# Patient Record
Sex: Male | Born: 1960 | Race: White | Hispanic: No | Marital: Married | Smoking: Never smoker
Health system: Southern US, Community
[De-identification: ages and names within clinical notes are randomized; demographics above are authoritative.]

## PROBLEM LIST (undated history)

## (undated) DIAGNOSIS — I499 Cardiac arrhythmia, unspecified: Secondary | ICD-10-CM

## (undated) DIAGNOSIS — E785 Hyperlipidemia, unspecified: Secondary | ICD-10-CM

## (undated) DIAGNOSIS — J189 Pneumonia, unspecified organism: Secondary | ICD-10-CM

## (undated) DIAGNOSIS — F329 Major depressive disorder, single episode, unspecified: Secondary | ICD-10-CM

## (undated) DIAGNOSIS — R011 Cardiac murmur, unspecified: Secondary | ICD-10-CM

## (undated) DIAGNOSIS — N529 Male erectile dysfunction, unspecified: Secondary | ICD-10-CM

## (undated) DIAGNOSIS — C44310 Basal cell carcinoma of skin of unspecified parts of face: Secondary | ICD-10-CM

## (undated) DIAGNOSIS — R079 Chest pain, unspecified: Secondary | ICD-10-CM

## (undated) DIAGNOSIS — I639 Cerebral infarction, unspecified: Secondary | ICD-10-CM

## (undated) DIAGNOSIS — F32A Depression, unspecified: Secondary | ICD-10-CM

## (undated) DIAGNOSIS — G709 Myoneural disorder, unspecified: Secondary | ICD-10-CM

## (undated) DIAGNOSIS — I219 Acute myocardial infarction, unspecified: Secondary | ICD-10-CM

## (undated) DIAGNOSIS — Z87442 Personal history of urinary calculi: Secondary | ICD-10-CM

## (undated) DIAGNOSIS — M1990 Unspecified osteoarthritis, unspecified site: Secondary | ICD-10-CM

## (undated) DIAGNOSIS — I2510 Atherosclerotic heart disease of native coronary artery without angina pectoris: Secondary | ICD-10-CM

## (undated) DIAGNOSIS — Z9861 Coronary angioplasty status: Secondary | ICD-10-CM

## (undated) DIAGNOSIS — D869 Sarcoidosis, unspecified: Secondary | ICD-10-CM

## (undated) DIAGNOSIS — G4730 Sleep apnea, unspecified: Secondary | ICD-10-CM

## (undated) DIAGNOSIS — Z8489 Family history of other specified conditions: Secondary | ICD-10-CM

## (undated) DIAGNOSIS — I10 Essential (primary) hypertension: Secondary | ICD-10-CM

## (undated) DIAGNOSIS — N189 Chronic kidney disease, unspecified: Secondary | ICD-10-CM

## (undated) DIAGNOSIS — R0600 Dyspnea, unspecified: Secondary | ICD-10-CM

## (undated) DIAGNOSIS — I209 Angina pectoris, unspecified: Secondary | ICD-10-CM

## (undated) DIAGNOSIS — K219 Gastro-esophageal reflux disease without esophagitis: Secondary | ICD-10-CM

## (undated) DIAGNOSIS — K5790 Diverticulosis of intestine, part unspecified, without perforation or abscess without bleeding: Secondary | ICD-10-CM

## (undated) HISTORY — PX: OTHER SURGICAL HISTORY: SHX169

## (undated) HISTORY — DX: Major depressive disorder, single episode, unspecified: F32.9

## (undated) HISTORY — DX: Unspecified osteoarthritis, unspecified site: M19.90

## (undated) HISTORY — PX: COLONOSCOPY: SHX174

## (undated) HISTORY — DX: Depression, unspecified: F32.A

## (undated) HISTORY — DX: Chest pain, unspecified: R07.9

## (undated) HISTORY — PX: LUNG BIOPSY: SHX232

## (undated) HISTORY — PX: CARPAL TUNNEL RELEASE: SHX101

## (undated) HISTORY — PX: HAND SURGERY: SHX662

## (undated) HISTORY — DX: Personal history of urinary calculi: Z87.442

## (undated) HISTORY — PX: ELBOW SURGERY: SHX618

## (undated) HISTORY — DX: Basal cell carcinoma of skin of unspecified parts of face: C44.310

## (undated) HISTORY — DX: Essential (primary) hypertension: I10

## (undated) HISTORY — DX: Male erectile dysfunction, unspecified: N52.9

## (undated) HISTORY — DX: Chronic kidney disease, unspecified: N18.9

## (undated) HISTORY — DX: Diverticulosis of intestine, part unspecified, without perforation or abscess without bleeding: K57.90

## (undated) HISTORY — DX: Sleep apnea, unspecified: G47.30

## (undated) HISTORY — DX: Atherosclerotic heart disease of native coronary artery without angina pectoris: I25.10

## (undated) HISTORY — PX: ROTATOR CUFF REPAIR: SHX139

## (undated) HISTORY — DX: Coronary angioplasty status: Z98.61

## (undated) HISTORY — DX: Hyperlipidemia, unspecified: E78.5

## (undated) HISTORY — PX: KNEE SURGERY: SHX244

## (undated) HISTORY — PX: CORONARY ANGIOPLASTY WITH STENT PLACEMENT: SHX49

---

## 1998-04-24 ENCOUNTER — Encounter: Payer: Self-pay | Admitting: Emergency Medicine

## 1998-04-24 ENCOUNTER — Emergency Department (HOSPITAL_COMMUNITY): Admission: EM | Admit: 1998-04-24 | Discharge: 1998-04-24 | Payer: Self-pay | Admitting: Emergency Medicine

## 1998-10-28 ENCOUNTER — Ambulatory Visit (HOSPITAL_COMMUNITY): Admission: RE | Admit: 1998-10-28 | Discharge: 1998-10-28 | Payer: Self-pay | Admitting: Family Medicine

## 1998-10-28 ENCOUNTER — Encounter: Payer: Self-pay | Admitting: Family Medicine

## 1999-08-26 ENCOUNTER — Encounter: Payer: Self-pay | Admitting: Family Medicine

## 1999-08-26 ENCOUNTER — Ambulatory Visit (HOSPITAL_COMMUNITY): Admission: RE | Admit: 1999-08-26 | Discharge: 1999-08-26 | Payer: Self-pay | Admitting: Family Medicine

## 2000-01-30 ENCOUNTER — Encounter: Payer: Self-pay | Admitting: Orthopedic Surgery

## 2000-01-30 ENCOUNTER — Encounter: Admission: RE | Admit: 2000-01-30 | Discharge: 2000-01-30 | Payer: Self-pay | Admitting: Orthopedic Surgery

## 2001-11-24 ENCOUNTER — Encounter: Admission: RE | Admit: 2001-11-24 | Discharge: 2001-11-24 | Payer: Self-pay | Admitting: Emergency Medicine

## 2001-11-24 ENCOUNTER — Encounter: Payer: Self-pay | Admitting: Emergency Medicine

## 2003-06-16 ENCOUNTER — Emergency Department (HOSPITAL_COMMUNITY): Admission: EM | Admit: 2003-06-16 | Discharge: 2003-06-16 | Payer: Self-pay | Admitting: Emergency Medicine

## 2003-06-16 ENCOUNTER — Encounter: Payer: Self-pay | Admitting: Emergency Medicine

## 2003-07-30 ENCOUNTER — Ambulatory Visit (HOSPITAL_COMMUNITY): Admission: RE | Admit: 2003-07-30 | Discharge: 2003-07-31 | Payer: Self-pay | Admitting: Cardiology

## 2003-08-08 ENCOUNTER — Inpatient Hospital Stay (HOSPITAL_COMMUNITY): Admission: EM | Admit: 2003-08-08 | Discharge: 2003-08-09 | Payer: Self-pay | Admitting: Emergency Medicine

## 2003-09-06 ENCOUNTER — Encounter: Admission: RE | Admit: 2003-09-06 | Discharge: 2003-09-06 | Payer: Self-pay | Admitting: Emergency Medicine

## 2003-09-06 ENCOUNTER — Encounter (INDEPENDENT_AMBULATORY_CARE_PROVIDER_SITE_OTHER): Payer: Self-pay | Admitting: Specialist

## 2003-09-06 ENCOUNTER — Inpatient Hospital Stay (HOSPITAL_COMMUNITY): Admission: EM | Admit: 2003-09-06 | Discharge: 2003-09-09 | Payer: Self-pay | Admitting: Emergency Medicine

## 2003-09-08 HISTORY — PX: APPENDECTOMY: SHX54

## 2003-09-14 ENCOUNTER — Encounter: Admission: RE | Admit: 2003-09-14 | Discharge: 2003-09-14 | Payer: Self-pay | Admitting: Surgery

## 2004-08-08 ENCOUNTER — Emergency Department (HOSPITAL_COMMUNITY): Admission: EM | Admit: 2004-08-08 | Discharge: 2004-08-09 | Payer: Self-pay | Admitting: Emergency Medicine

## 2005-10-16 ENCOUNTER — Emergency Department (HOSPITAL_COMMUNITY): Admission: EM | Admit: 2005-10-16 | Discharge: 2005-10-16 | Payer: Self-pay | Admitting: Emergency Medicine

## 2005-10-23 ENCOUNTER — Encounter: Admission: RE | Admit: 2005-10-23 | Discharge: 2005-10-23 | Payer: Self-pay | Admitting: Emergency Medicine

## 2005-11-18 ENCOUNTER — Inpatient Hospital Stay (HOSPITAL_COMMUNITY): Admission: EM | Admit: 2005-11-18 | Discharge: 2005-11-20 | Payer: Self-pay | Admitting: Emergency Medicine

## 2005-12-17 ENCOUNTER — Encounter: Admission: RE | Admit: 2005-12-17 | Discharge: 2005-12-17 | Payer: Self-pay | Admitting: Emergency Medicine

## 2006-08-26 ENCOUNTER — Ambulatory Visit (HOSPITAL_BASED_OUTPATIENT_CLINIC_OR_DEPARTMENT_OTHER): Admission: RE | Admit: 2006-08-26 | Discharge: 2006-08-26 | Payer: Self-pay | Admitting: Orthopedic Surgery

## 2006-09-16 ENCOUNTER — Ambulatory Visit (HOSPITAL_COMMUNITY): Admission: RE | Admit: 2006-09-16 | Discharge: 2006-09-16 | Payer: Self-pay | Admitting: Orthopedic Surgery

## 2006-10-28 ENCOUNTER — Ambulatory Visit (HOSPITAL_COMMUNITY): Admission: RE | Admit: 2006-10-28 | Discharge: 2006-10-29 | Payer: Self-pay | Admitting: Orthopaedic Surgery

## 2006-12-09 ENCOUNTER — Ambulatory Visit (HOSPITAL_BASED_OUTPATIENT_CLINIC_OR_DEPARTMENT_OTHER): Admission: RE | Admit: 2006-12-09 | Discharge: 2006-12-09 | Payer: Self-pay | Admitting: Orthopedic Surgery

## 2007-02-16 ENCOUNTER — Inpatient Hospital Stay (HOSPITAL_COMMUNITY): Admission: EM | Admit: 2007-02-16 | Discharge: 2007-02-18 | Payer: Self-pay | Admitting: Emergency Medicine

## 2007-02-20 ENCOUNTER — Ambulatory Visit: Payer: Self-pay | Admitting: *Deleted

## 2007-02-21 ENCOUNTER — Inpatient Hospital Stay (HOSPITAL_COMMUNITY): Admission: EM | Admit: 2007-02-21 | Discharge: 2007-02-22 | Payer: Self-pay | Admitting: Emergency Medicine

## 2007-07-08 ENCOUNTER — Ambulatory Visit (HOSPITAL_COMMUNITY): Admission: RE | Admit: 2007-07-08 | Discharge: 2007-07-08 | Payer: Self-pay | Admitting: Orthopaedic Surgery

## 2007-09-08 HISTORY — PX: CERVICAL FUSION: SHX112

## 2007-09-14 ENCOUNTER — Inpatient Hospital Stay (HOSPITAL_COMMUNITY): Admission: EM | Admit: 2007-09-14 | Discharge: 2007-09-15 | Payer: Self-pay | Admitting: Emergency Medicine

## 2007-12-29 ENCOUNTER — Inpatient Hospital Stay (HOSPITAL_COMMUNITY): Admission: RE | Admit: 2007-12-29 | Discharge: 2007-12-31 | Payer: Self-pay | Admitting: Specialist

## 2008-06-07 IMAGING — CR DG CHEST 1V PORT
1 series · 1 of 1 positions shown · non-contrast
Comparison: 09/16/06.

CLINICAL DATA: Chest pain.  Shortness of breath. 
 PORTABLE CHEST ? 1 VIEW ? 02/16/07 AT 6527 HOURS:

[view not recorded]
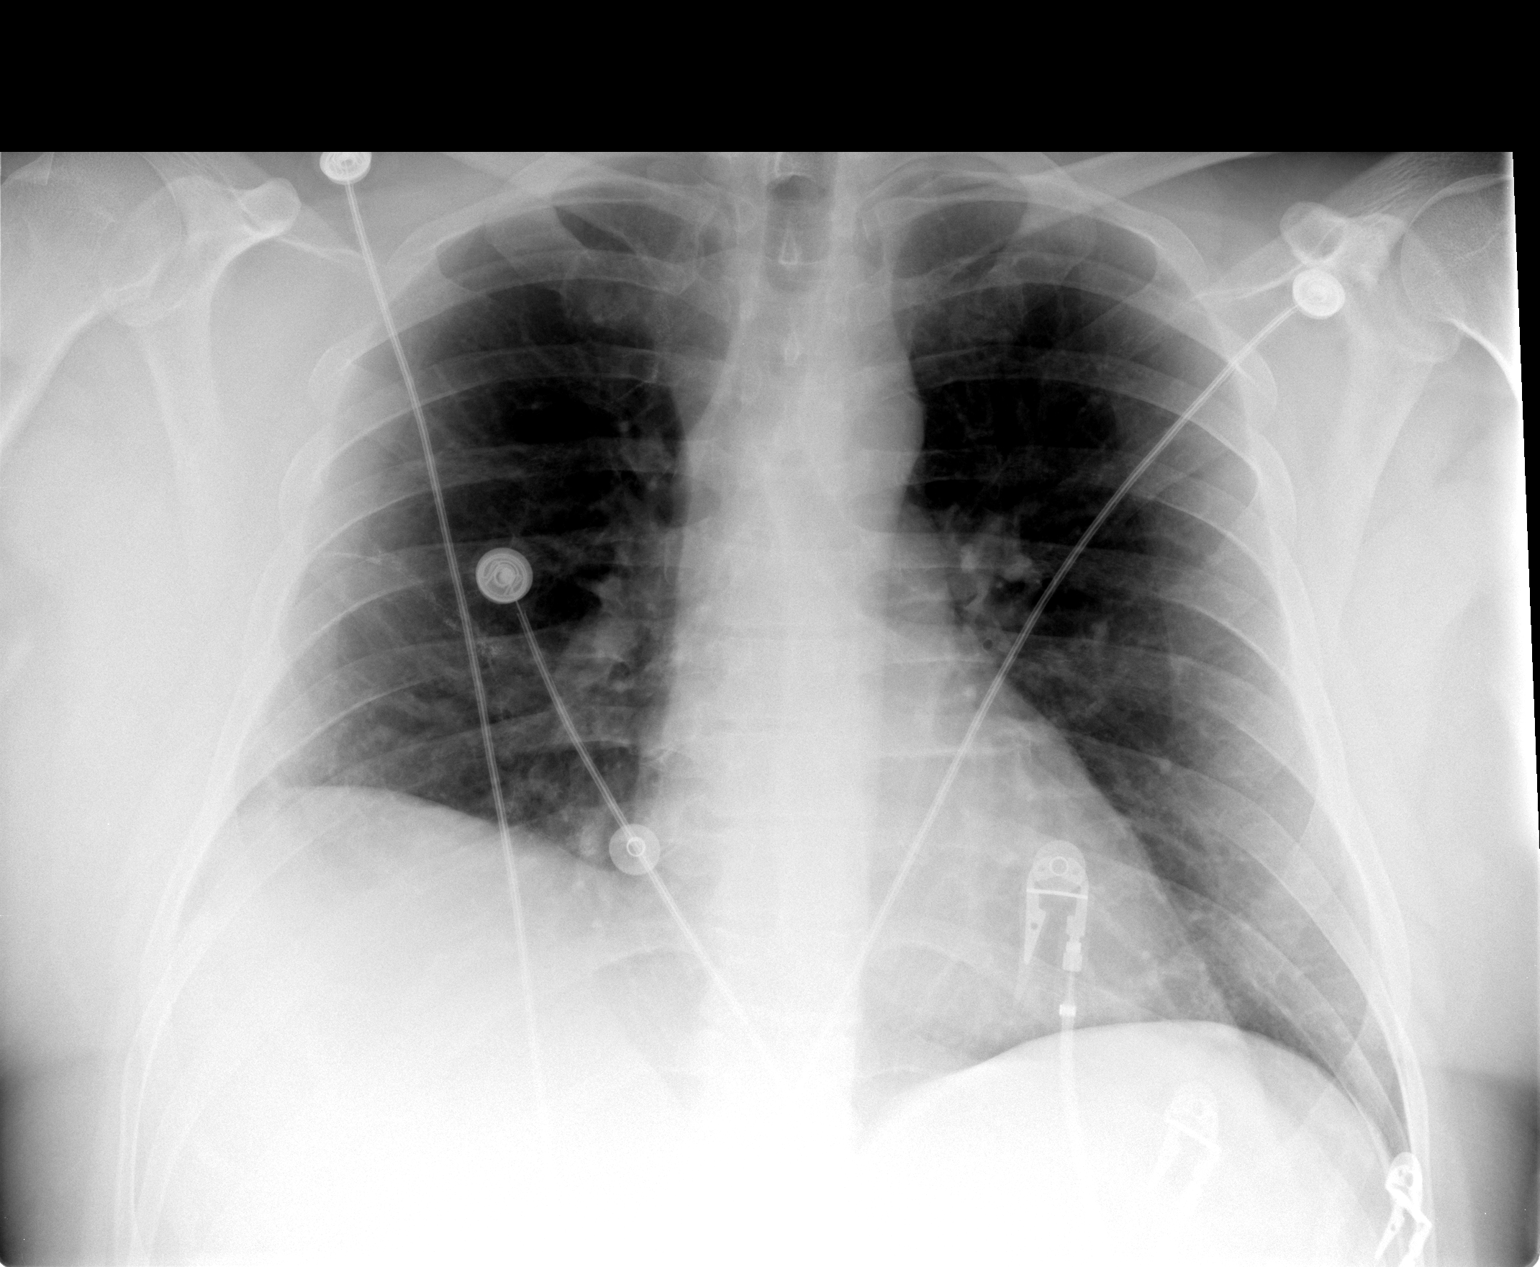

[1 of 1 positions shown; findings below may reference images not displayed]

FINDINGS: Heart size is within normal limits.  The right hemidiaphragm is mildly elevated, as seen on prior studies.  There is no acute infiltrate, effusion, or edema.  There are some surgical clips in the right lung base from prior resection.
IMPRESSION: Postop changes of the right lower lobe.  No acute abnormality.

## 2008-06-11 IMAGING — CR DG CHEST 1V PORT
1 series · 1 of 1 positions shown · non-contrast
Comparison: 02/16/07.

CLINICAL DATA: Chest pain.  
 PORTABLE CHEST - 1 VIEW 02/20/07 AT 8254 HOURS:

[view not recorded]
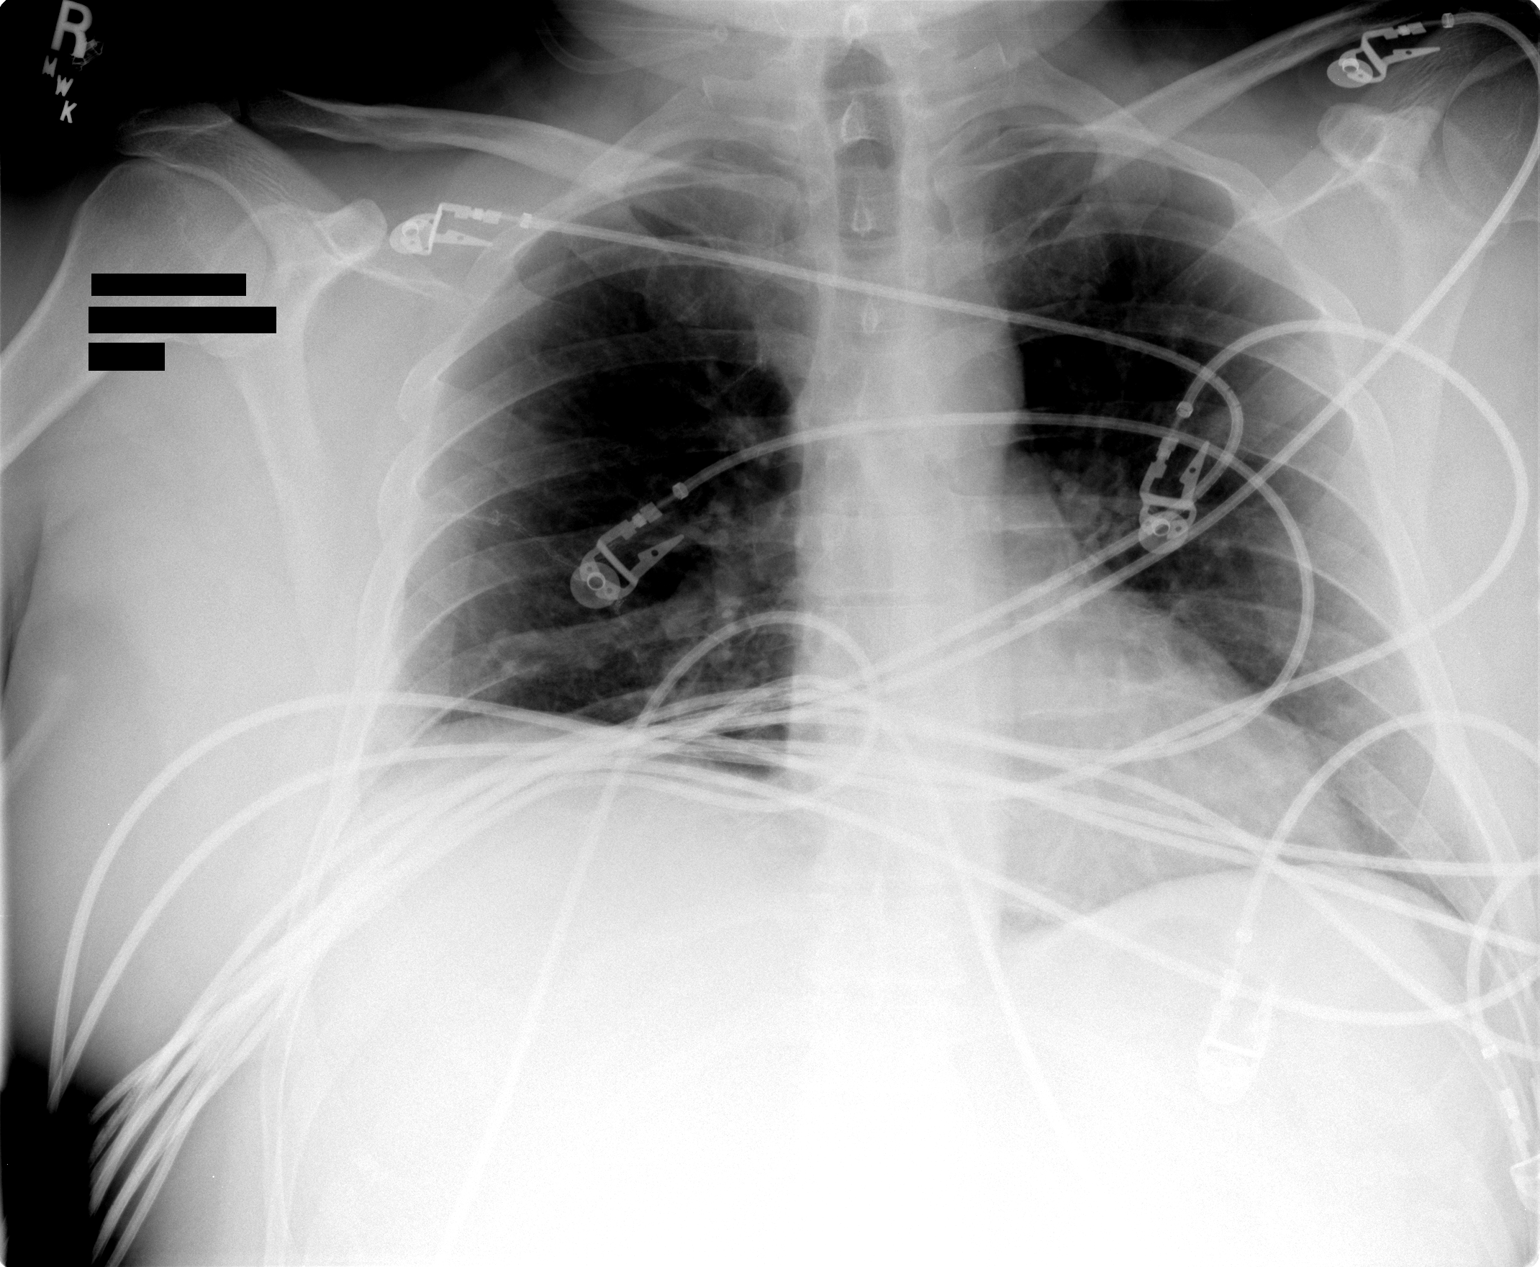

[1 of 1 positions shown; findings below may reference images not displayed]

FINDINGS: The lungs are under inflated with bibasilar atelectasis.  The heart is normal in size.  No pneumothoraces or effusions are seen.  Postoperative changes in the right mid lung are noted.
IMPRESSION: No acute cardiopulmonary disease.

## 2008-10-03 ENCOUNTER — Ambulatory Visit: Payer: Self-pay | Admitting: Cardiovascular Disease

## 2008-10-16 ENCOUNTER — Encounter: Admission: RE | Admit: 2008-10-16 | Discharge: 2008-10-16 | Payer: Self-pay | Admitting: Specialist

## 2010-03-04 ENCOUNTER — Encounter: Payer: Self-pay | Admitting: Family Medicine

## 2010-06-03 ENCOUNTER — Encounter: Payer: Self-pay | Admitting: Family Medicine

## 2010-06-05 ENCOUNTER — Encounter: Payer: Self-pay | Admitting: Family Medicine

## 2010-09-22 ENCOUNTER — Encounter: Payer: Self-pay | Admitting: Family Medicine

## 2010-09-28 ENCOUNTER — Encounter: Payer: Self-pay | Admitting: Cardiology

## 2010-09-28 ENCOUNTER — Encounter: Payer: Self-pay | Admitting: Family Medicine

## 2010-09-29 ENCOUNTER — Inpatient Hospital Stay (HOSPITAL_COMMUNITY)
Admission: EM | Admit: 2010-09-29 | Discharge: 2010-09-29 | Payer: Self-pay | Source: Home / Self Care | Attending: Cardiology | Admitting: Cardiology

## 2010-09-30 DIAGNOSIS — I2510 Atherosclerotic heart disease of native coronary artery without angina pectoris: Secondary | ICD-10-CM | POA: Insufficient documentation

## 2010-09-30 DIAGNOSIS — R079 Chest pain, unspecified: Secondary | ICD-10-CM | POA: Insufficient documentation

## 2010-09-30 DIAGNOSIS — G4730 Sleep apnea, unspecified: Secondary | ICD-10-CM | POA: Insufficient documentation

## 2010-09-30 DIAGNOSIS — I10 Essential (primary) hypertension: Secondary | ICD-10-CM | POA: Insufficient documentation

## 2010-09-30 DIAGNOSIS — Z9861 Coronary angioplasty status: Secondary | ICD-10-CM | POA: Insufficient documentation

## 2010-09-30 LAB — COMPREHENSIVE METABOLIC PANEL
ALT: 40 U/L (ref 0–53)
AST: 30 U/L (ref 0–37)
Albumin: 4.3 g/dL (ref 3.5–5.2)
Alkaline Phosphatase: 74 U/L (ref 39–117)
BUN: 12 mg/dL (ref 6–23)
CO2: 22 mEq/L (ref 19–32)
Calcium: 10.1 mg/dL (ref 8.4–10.5)
Chloride: 104 mEq/L (ref 96–112)
Creatinine, Ser: 0.91 mg/dL (ref 0.4–1.5)
GFR calc Af Amer: >60 mL/min (ref >60)
GFR calc non Af Amer: >60 mL/min (ref >60)
Glucose, Bld: 201 mg/dL — ABNORMAL HIGH (ref 70–99)
Potassium: 4.3 mEq/L (ref 3.5–5.1)
Sodium: 139 mEq/L (ref 135–145)
Total Bilirubin: 0.7 mg/dL (ref 0.3–1.2)
Total Protein: 8.1 g/dL (ref 6.0–8.3)

## 2010-09-30 LAB — CBC
HCT: 47.3 % (ref 39.0–52.0)
Hemoglobin: 16.1 g/dL (ref 13.0–17.0)
MCH: 29.9 pg (ref 26.0–34.0)
MCHC: 34 g/dL (ref 30.0–36.0)
MCV: 87.8 fL (ref 78.0–100.0)
Platelets: 217 10*3/uL (ref 150–400)
RBC: 5.39 MIL/uL (ref 4.22–5.81)
RDW: 13.3 % (ref 11.5–15.5)
WBC: 8.1 10*3/uL (ref 4.0–10.5)

## 2010-09-30 LAB — POCT I-STAT, CHEM 8
BUN: 15 mg/dL (ref 6–23)
Calcium, Ion: 1.19 mmol/L (ref 1.12–1.32)
Chloride: 106 mEq/L (ref 96–112)
Creatinine, Ser: 1 mg/dL (ref 0.4–1.5)
Glucose, Bld: 207 mg/dL — ABNORMAL HIGH (ref 70–99)
HCT: 52 % (ref 39.0–52.0)
Hemoglobin: 17.7 g/dL — ABNORMAL HIGH (ref 13.0–17.0)
Potassium: 4.3 mEq/L (ref 3.5–5.1)
Sodium: 139 mEq/L (ref 135–145)
TCO2: 25 mmol/L (ref 0–100)

## 2010-09-30 LAB — CK TOTAL AND CKMB (NOT AT ARMC)
CK, MB: 1.3 ng/mL (ref 0.3–4.0)
Relative Index: INVALID (ref 0.0–2.5)
Total CK: 72 U/L (ref 7–232)

## 2010-09-30 LAB — POCT CARDIAC MARKERS
CKMB, poc: <1 ng/mL — ABNORMAL LOW (ref 1.0–8.0)
Myoglobin, poc: 68.9 ng/mL (ref 12–200)
Troponin i, poc: <0.05 ng/mL (ref 0.00–0.09)

## 2010-09-30 LAB — HEMOGLOBIN A1C
Hgb A1c MFr Bld: 7.8 % — ABNORMAL HIGH (ref <5.7)
Mean Plasma Glucose: 177 mg/dL — ABNORMAL HIGH (ref <117)

## 2010-09-30 LAB — LIPID PANEL
Cholesterol: 168 mg/dL (ref 0–200)
HDL: 46 mg/dL (ref >39)
LDL Cholesterol: 93 mg/dL (ref 0–99)
Total CHOL/HDL Ratio: 3.7 RATIO
Triglycerides: 143 mg/dL (ref <150)
VLDL: 29 mg/dL (ref 0–40)

## 2010-09-30 LAB — TSH: TSH: 1.867 u[IU]/mL (ref 0.350–4.500)

## 2010-09-30 LAB — TROPONIN I: Troponin I: 0.02 ng/mL (ref 0.00–0.06)

## 2010-10-09 NOTE — Letter (Signed)
Summary: [facility]  [facility]   Imported By: Maryln Gottron 09/30/2010 15:09:36  _____________________________________________________________________  External Attachment:    Type:   Image     Comment:   External Document

## 2010-10-13 ENCOUNTER — Ambulatory Visit: Payer: Self-pay | Admitting: Family Medicine

## 2010-10-20 ENCOUNTER — Telehealth: Payer: Self-pay | Admitting: Family Medicine

## 2010-10-20 ENCOUNTER — Ambulatory Visit (INDEPENDENT_AMBULATORY_CARE_PROVIDER_SITE_OTHER): Payer: PRIVATE HEALTH INSURANCE | Admitting: Family Medicine

## 2010-10-20 ENCOUNTER — Encounter: Payer: Self-pay | Admitting: Family Medicine

## 2010-10-20 DIAGNOSIS — E7800 Pure hypercholesterolemia, unspecified: Secondary | ICD-10-CM | POA: Insufficient documentation

## 2010-10-20 DIAGNOSIS — M129 Arthropathy, unspecified: Secondary | ICD-10-CM | POA: Insufficient documentation

## 2010-10-20 DIAGNOSIS — E1149 Type 2 diabetes mellitus with other diabetic neurological complication: Secondary | ICD-10-CM | POA: Insufficient documentation

## 2010-10-20 DIAGNOSIS — Z87442 Personal history of urinary calculi: Secondary | ICD-10-CM | POA: Insufficient documentation

## 2010-10-20 DIAGNOSIS — F329 Major depressive disorder, single episode, unspecified: Secondary | ICD-10-CM | POA: Insufficient documentation

## 2010-10-20 DIAGNOSIS — M1990 Unspecified osteoarthritis, unspecified site: Secondary | ICD-10-CM | POA: Insufficient documentation

## 2010-10-20 DIAGNOSIS — Z9189 Other specified personal risk factors, not elsewhere classified: Secondary | ICD-10-CM | POA: Insufficient documentation

## 2010-10-20 DIAGNOSIS — R079 Chest pain, unspecified: Secondary | ICD-10-CM

## 2010-10-22 ENCOUNTER — Telehealth: Payer: Self-pay | Admitting: Family Medicine

## 2010-10-22 ENCOUNTER — Other Ambulatory Visit (INDEPENDENT_AMBULATORY_CARE_PROVIDER_SITE_OTHER): Payer: PRIVATE HEALTH INSURANCE

## 2010-10-22 ENCOUNTER — Encounter (INDEPENDENT_AMBULATORY_CARE_PROVIDER_SITE_OTHER): Payer: Self-pay | Admitting: *Deleted

## 2010-10-22 ENCOUNTER — Other Ambulatory Visit: Payer: Self-pay | Admitting: Family Medicine

## 2010-10-22 DIAGNOSIS — E7800 Pure hypercholesterolemia, unspecified: Secondary | ICD-10-CM

## 2010-10-22 DIAGNOSIS — E1149 Type 2 diabetes mellitus with other diabetic neurological complication: Secondary | ICD-10-CM

## 2010-10-22 LAB — BASIC METABOLIC PANEL
BUN: 12 mg/dL (ref 6–23)
CO2: 29 mEq/L (ref 19–32)
Calcium: 9.3 mg/dL (ref 8.4–10.5)
Chloride: 106 mEq/L (ref 96–112)
Creatinine, Ser: 1 mg/dL (ref 0.4–1.5)
GFR: 80.14 mL/min (ref >60.00)
Glucose, Bld: 158 mg/dL — ABNORMAL HIGH (ref 70–99)
Potassium: 4.1 mEq/L (ref 3.5–5.1)
Sodium: 142 mEq/L (ref 135–145)

## 2010-10-22 LAB — HEPATIC FUNCTION PANEL
ALT: 46 U/L (ref 0–53)
AST: 32 U/L (ref 0–37)
Albumin: 4 g/dL (ref 3.5–5.2)
Alkaline Phosphatase: 63 U/L (ref 39–117)
Bilirubin, Direct: 0.1 mg/dL (ref 0.0–0.3)
Total Bilirubin: 0.6 mg/dL (ref 0.3–1.2)
Total Protein: 7.1 g/dL (ref 6.0–8.3)

## 2010-10-22 LAB — LIPID PANEL
Cholesterol: 147 mg/dL (ref 0–200)
HDL: 39 mg/dL — ABNORMAL LOW (ref >39.00)
LDL Cholesterol: 79 mg/dL (ref 0–99)
Total CHOL/HDL Ratio: 4
Triglycerides: 143 mg/dL (ref 0.0–149.0)
VLDL: 28.6 mg/dL (ref 0.0–40.0)

## 2010-10-22 LAB — HEMOGLOBIN A1C: Hgb A1c MFr Bld: 8.2 % — ABNORMAL HIGH (ref 4.6–6.5)

## 2010-10-23 ENCOUNTER — Encounter: Payer: Self-pay | Admitting: Family Medicine

## 2010-10-23 NOTE — Letter (Signed)
Summary: [facility]  External Correspondence   Imported By: Kassie Mends 10/13/2010 11:25:09  _____________________________________________________________________  External Attachment:    Type:   Image     Comment:   External Document

## 2010-10-29 NOTE — Letter (Signed)
Summary: [facility]  [facility]   Imported By: Maryln Gottron 10/24/2010 14:19:05  _____________________________________________________________________  External Attachment:    Type:   Image     Comment:   External Document

## 2010-10-29 NOTE — Progress Notes (Signed)
  Phone Note Call from Patient Call back at [phone]   Caller: Patient Summary of Call: Patients Ortho Dr recommended he take something before having the EMGs like Valium etc, as he told the patient he had this done himself and it really hurts. He forgot to ask you for something before he left. He uses Facilities [facility].  Initial call taken by: Carlton Adam,  October 20, 2010 3:36 PM  Follow-up for Phone Call        please call in valium 5mg , 1/2 to 1 tab by mouth before the procedure.  #1, no rf.  He'll need to have a driver and he'll need to notify the clinic about the potential use of the medicine before going for the procedure.  He needs to make sure it is okay with them.  Sedation caution.    And I called Dr. Yetta Barre and left a message at his clinic.  I will await return call. Crawford Givens MD  October 21, 2010 8:28 AM   Left message on voicemail  to return call.  Medication phoned to pharmacy. Lugene Fuquay CMA (AAMA)  October 21, 2010 10:02 AM   Patient Advised. Lugene Fuquay CMA (AAMA)  October 22, 2010 10:34 AM      New/Updated Medications: VALIUM 5 MG TABS (DIAZEPAM) Take 1/2 to 1 tablet by mouth prior to procedure.

## 2010-10-29 NOTE — Assessment & Plan Note (Signed)
Summary: new pt to est JRT r/s from 10/13/10   Vital Signs:  Patient profile:   50 year old male Height:      71 inches Weight:      216.75 pounds BMI:     30.34 Temp:     98.7 degrees F oral Pulse rate:   80 / minute Pulse rhythm:   regular BP sitting:   130 / 86  (left arm) Cuff size:   large  Vitals Entered By: Delilah Shan CMA Shaya Reddick Dull) (October 20, 2010 2:02 PM) CC: New Patient to Establish   History of Present Illness: Diabetes:  Using medications without difficulties:yes Hypoglycemic episodes: no Hyperglycemic episodes:yes Feet problems:yes Blood Sugars averaging:140-200 in AMs eye exam within last year: no, appointment is pending  Hypertension:      Using medication without problems or lightheadedness: yes Chest pain with exertion:see below Edema:no Short of breath: occ, with exertion Average home BPs:not checked recently  Other issues: see below  Chronic chest pain.  S/p ranexa w/o relief.  Can be sharp but may be dull.  Variable severity.  Constant presence.  At rest, it isn't as painful.  If he exerts himself, he is more likely to hurt more but it don't necessarily correspond with the activity.  Last pain free day was about 3 months ago.  It was present intermittently before that.  Occ twinge of pain in the upper abdomen.  NTG help some with the pain. There is a possible plan for recath at the end of 2/12.  Also with h/o sarcoid.    R leg continues to ache "like a ton of bricks in the leg."  Similar but less pain in L leg.  No help with gabapentin. Prev notes from ortho reviewed.   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 Osteoarthritis [facility]- cards Yetta Barre- [facility])  Past Surgical History: 2008: Cardiac cath 02/2007: taxus stent in RCA and LAD. 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  Otherwise negative.    Physical Exam  General:  no apparent distress normocephalic atraumatic mucous membranes moist neck supple regular rate and rhythm clear to auscultation bilaterally abdomen soft, not tender to palpation, normal bs Dec in sensation on the feet bilaterally with decrease in strength for standing on toes/heels and resisted flexion/ext of great toes, all weaker on L than R leg.  decrease in quad strength, again symptoms L>R.  some of the weakness on testing may be related to pain and the patient admits this 1+ DP pulses bilaterally  Diabetes Management Exam:    Foot Exam (with socks and/or shoes not present):       Sensory-Pinprick/Light touch:          Left medial foot (L-4): diminished          Left dorsal foot (L-5): diminished          Left lateral foot (S-1): diminished          Right medial foot (L-4): diminished          Right dorsal foot (L-5): diminished          Right lateral foot  (S-1): diminished       Sensory-Monofilament:          Left foot: diminished          Right foot: diminished       Inspection:          Left foot: normal          Right foot: normal       Nails:          Left foot: normal          Right foot: normal   Impression & Recommendations:  Problem # 1:  CHEST PAIN UNSPECIFIED (ICD-786.50) Pt to call Dr. Verl Dicker office about follow up on this.  I appreciate cards help.    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  He may need reeducation on DM2.  I talked with him- even if the pain is in no way related to peripheral neuropathy from DM2, this still needs to be controlled. He understood.  He may benefit from cymbalta.  I'll await the nerve conduction studies and will d/w Dr. Yetta Barre.   The following medications were removed from the medication list:    Metformin Hcl 500 Mg Tabs (Metformin hcl) .[name] Kitchen... Take 2 tablets by mouth two times a day His updated medication list for this problem includes:    Glimepiride 4 Mg Tabs (Glimepiride) .[name] Kitchen... Take 1 tablet by mouth two times a day    Aspirin 81 Mg Tabs (Aspirin) .[name] Kitchen... Take 1 tablet by mouth two times a day    Lisinopril 10 Mg Tabs (Lisinopril) .[name] Kitchen... Take 1 tablet by mouth once a day    Metformin Hcl 1000 Mg Tabs (Metformin hcl) .[name] Kitchen... Take 1 tablet by mouth two times a day  Orders: Misc. Referral (Misc. Ref)  Problem # 3:  HYPERCHOLESTEROLEMIA (ICD-272.0) return for labs.  His updated medication list for this problem includes:    Pravachol 40 Mg Tabs (Pravastatin sodium) .[name] Kitchen... Take 1 tab by mouth at bedtime  Complete Medication List: 1)  Coreg 3.125 Mg Tabs (Carvedilol) .... Take 1 tablet by mouth two times a day 2)  Glimepiride 4 Mg Tabs (Glimepiride) .... Take 1 tablet by mouth two times a day 3)  Aspirin 81 Mg Tabs (Aspirin) .... Take 1 tablet by mouth two times a day 4)  Lisinopril 10 Mg Tabs (Lisinopril) .... Take 1 tablet by mouth  once a day 5)  Pravachol 40 Mg Tabs (Pravastatin sodium) .... Take 1 tab by mouth at bedtime 6)  Nitrolingual 0.4 Mg/spray Soln (Nitroglycerin) .... One spray under tongue as directed as needed 7)  Metformin Hcl 1000 Mg Tabs (Metformin hcl) .... Take 1 tablet by mouth two times a day  Patient Instructions: 1)  See Shirlee Limerick about your referral before your leave today.   2)  Return for fasting labs. cmet/lipid/A1c  250.60 3)  I'll talk to Dr. Yetta Barre.  Call Dr. [name] about the timing of your next heart test.    Orders Added: 1)  New Patient Level III [16109] 2)  Misc. Referral [Misc. Ref]    Current Allergies (reviewed today): No known allergies    Appended Document: new pt to est JRT r/s from 10/13/10    Clinical Lists Changes  Observations: Added new observation of FLU VAX: Historical (09/17/2010 23:59) Added new observation of TD BOOSTER: Historical (09/07/2009 23:59) Added new observation of PNEUMOVAX: Historical (09/08/2007 23:59)         Immunization History:  Tetanus/Td Immunization History:    Tetanus/Td:  historical (09/07/2009)  Influenza Immunization History:    Influenza:  historical (09/17/2010)  Pneumovax Immunization History:    Pneumovax:  historical (09/08/2007)

## 2010-10-29 NOTE — Progress Notes (Signed)
  Phone Note Outgoing Call   Summary of Call: I took call from Dr. Yetta Barre with ortho.  We talked about his situation.  I'll address his DM2 labs, the patient will follow up with cards re:CP and I'll await his nerve conduction studies.  He may benefit from cymbalta.  We can address this after the results are in. Crawford Givens MD  October 22, 2010 6:44 PM

## 2010-10-29 NOTE — Letter (Signed)
Summary: [facility]  [facility]   Imported By: Maryln Gottron 10/24/2010 14:20:23  _____________________________________________________________________  External Attachment:    Type:   Image     Comment:   External Document

## 2010-10-29 NOTE — Letter (Signed)
Summary: [facility]  [facility]   Imported By: Maryln Gottron 10/24/2010 14:21:52  _____________________________________________________________________  External Attachment:    Type:   Image     Comment:   External Document  Appended Document: [facility]    Clinical Lists Changes  Allergies: Added new allergy or adverse reaction of GABAPENTIN Observations: Added new observation of ALLERGY REV: Done (10/24/2010 15:35) Added new observation of NKA: F (10/24/2010 15:35)        Allergies (verified): 1)  Gabapentin

## 2010-10-29 NOTE — Letter (Signed)
Summary: [facility]  [facility]   Imported By: Maryln Gottron 10/24/2010 14:23:06  _____________________________________________________________________  External Attachment:    Type:   Image     Comment:   External Document

## 2010-11-03 ENCOUNTER — Telehealth (INDEPENDENT_AMBULATORY_CARE_PROVIDER_SITE_OTHER): Payer: Self-pay | Admitting: *Deleted

## 2010-11-04 NOTE — Letter (Signed)
Summary: [facility]  [facility]   Imported By: Lanelle Bal 10/30/2010 12:59:39  _____________________________________________________________________  External Attachment:    Type:   Image     Comment:   External Document

## 2010-11-06 ENCOUNTER — Encounter: Payer: Self-pay | Admitting: Family Medicine

## 2010-11-06 ENCOUNTER — Telehealth: Payer: Self-pay | Admitting: Family Medicine

## 2010-11-13 NOTE — Progress Notes (Signed)
Summary: needs another valium- took it on wrong day  Phone Note Call from Patient Call back at Home Phone [phone]   Caller: Patient Call For: Crawford Givens MD Summary of Call: Patient was told his procedure was today. He took the valium  this morning thinking that the procedure was going to be today and he is actually not having it done until next monday. He is asking if he can get another one called in. He also says that after taking it he couldn't even tell that he had so he is asking if he can get something stronger. Uses [facility].  Initial call taken by: Melody Comas,  November 03, 2010 3:14 PM  Follow-up for Phone Call        please call in.  He'll need a driver after the procedure after taking the valium.  Please tell him that I will await his test results and further input from cardiology.  Please find out if he is going to have another heart cath done by cardiology.   If so, I would like to see him after that is done and after the nerve studies are done.  please get him a tentative OV.  We'll need to discuss his DM- his A1c was up and we need to consider diet/other mets (potentially actos depending on his heart tests vs insulin). Crawford Givens MD  November 05, 2010 9:47 PM   Additional Follow-up for Phone Call Additional follow up Details #1::        Left message on voicemail  to return call. Delilah Shan CMA Duncan Dull)  November 06, 2010 2:01 PM   Patient Advised.   Patient says that Cardiology did a stress test and told him that he does not need a heart cath, his heart is fine.  He is now going to have his nerve studies done in [place], not sure when yet.  He will call for a 30 minute OV to get results from you after he has went for the test.    Medication phoned to pharmacy.Lugene Fuquay CMA (AAMA)  November 07, 2010 1:55 PM     New/Updated Medications: VALIUM 5 MG TABS (DIAZEPAM) Take 1.5 tablet by mouth prior to procedure Prescriptions: VALIUM 5 MG TABS (DIAZEPAM) Take 1.5  tablet by mouth prior to procedure  #1.5 x 0   Entered by:   Crawford Givens MD   Authorized by:   Daine Gip   Signed by:   Crawford Givens MD on 11/05/2010   Method used:   Telephoned to ...       [facility]* (retail)       [address]       [place], [place]  [address]       Ph: [phone]       Fax: [phone]   RxID:   [id number]

## 2010-11-13 NOTE — Progress Notes (Signed)
Summary: Form for CPAP  Phone Note Other Incoming   Caller: Form to be filled out Summary of Call: Form for CPAP machine / Letter of Medical Necessity in your in box. Initial call taken by: Delilah Shan CMA Nilsa Macht Dull),  November 06, 2010 4:30 PM  Follow-up for Phone Call        please send in.  thanks. Crawford Givens MD  November 06, 2010 4:51 PM   Faxed.  Lugene Fuquay CMA Nikky Duba Dull)  November 07, 2010 1:57 PM

## 2010-11-18 NOTE — Letter (Signed)
Summary: Medical Necessity for CPAP Supplies  Medical Necessity for CPAP Supplies   Imported By: Maryln Gottron 11/12/2010 14:08:44  _____________________________________________________________________  External Attachment:    Type:   Image     Comment:   External Document

## 2010-11-18 NOTE — Letter (Signed)
Summary: [facility]  [facility]   Imported By: Lanelle Bal 11/10/2010 12:53:28  _____________________________________________________________________  External Attachment:    Type:   Image     Comment:   External Document  Appended Document: [facility] I saw the note from Dr. [name].  Cath isn't planned per his note. Please have the patient follow up with neuro for the nerve conduction studies and then (after we can get a copy of the results) have him follow up with me to talk about options for DM2 and his pain.  Will need a OV.  thanks.    Patient advised previously.  See previous phone note.   He says there may be a delay in getting the nerve conduction studies done and he will call for an appt. here once that is done.  Delilah Shan CMA (AAMA)  November 12, 2010 11:22 AM  Clinical Lists Changes

## 2010-11-21 ENCOUNTER — Encounter (INDEPENDENT_AMBULATORY_CARE_PROVIDER_SITE_OTHER): Payer: Self-pay | Admitting: *Deleted

## 2010-11-25 NOTE — Letter (Signed)
Summary: Generic Letter  [place] [facility]  [address] [place], [place] [address]   Phone: [phone]  Fax: [phone]    11/21/2010    Tony Manning [address] [place], [place]  [address]  [place]    Dear [name],    Just a reminder to give [place] an update on when your tests have been scheduled and once those tests are completed, please schedule a follow up appointment with Dr. Para March.    When you phone in for that appointment, please ask the receptionist to make it a 30 minute office visit to be sure that enough time is allotted for Dr. Para March to discuss the issues.     Sincerely,   Lugene Fuquay CMA (AAMA)

## 2010-11-28 ENCOUNTER — Encounter: Payer: Self-pay | Admitting: Family Medicine

## 2010-12-03 ENCOUNTER — Encounter: Payer: Self-pay | Admitting: Family Medicine

## 2010-12-03 ENCOUNTER — Ambulatory Visit (INDEPENDENT_AMBULATORY_CARE_PROVIDER_SITE_OTHER): Payer: PRIVATE HEALTH INSURANCE | Admitting: Family Medicine

## 2010-12-03 VITALS — BP 108/68 | HR 88 | Temp 98.3°F | Ht 71.0 in | Wt 221.1 lb

## 2010-12-03 DIAGNOSIS — H9190 Unspecified hearing loss, unspecified ear: Secondary | ICD-10-CM | POA: Insufficient documentation

## 2010-12-03 DIAGNOSIS — M542 Cervicalgia: Secondary | ICD-10-CM

## 2010-12-03 DIAGNOSIS — E1149 Type 2 diabetes mellitus with other diabetic neurological complication: Secondary | ICD-10-CM

## 2010-12-03 DIAGNOSIS — M129 Arthropathy, unspecified: Secondary | ICD-10-CM

## 2010-12-03 MED ORDER — AMITRIPTYLINE HCL 25 MG PO TABS: ORAL_TABLET | ORAL | Status: DC

## 2010-12-03 NOTE — Assessment & Plan Note (Signed)
Knee surgery planned for 12/15/10 with Dr. Yetta Barre in [facility] [facility] (h/o B knee pain).

## 2010-12-03 NOTE — Assessment & Plan Note (Addendum)
Will start TCA with gradual increase in dose- 25mg  per week up to 75 mg po qhs.  Return for A1c in 28month with OV a few days later.  D/w pt about cutting calories and eating 3 meals a day instead of 1. I don't know if the chest pain is related to the neuropathy.  We'll see if the TCA has any effect on this.

## 2010-12-03 NOTE — Patient Instructions (Signed)
Start the amitriptyline at 25mg  at night.  Inc to 50mg  next week if needed.  Max 75mg  at night.  Call me if it doesn't help.  It can make you drowsy.  Work on eating 3 meals a day and cutting back on your calories.  Schedule a follow up appointment with me in 3 months.  OV (a few days after labs).  Take care.   See Shirlee Limerick about your referral before your leave today.

## 2010-12-03 NOTE — Progress Notes (Addendum)
B leg and back pain. He had nerve conduction studies done. I don't have the report yet but he was told it was a mild neuropathy and 'not a pinched nerve'.  Gabapentin didn't help the pain.  Pain is dull ache present most of the time in the legs but can have stinging sensation that does down the the R leg>L leg. However, L foot pain>R foot pain.  All of it gets worse with inc in activity.    CAD- Had stress test with [facility] per patient and 'that was okay.'  Still with chest pain, but this is thought not to be cardiac.  He has intermittent pain across the anterior precordium.  No sob.   H/o neck pain and he has gone through PT/C spine fusion.  Prev has seen Dr. Otelia Sergeant but they don't take his insurance anymore.  He would like second opinion.    Activity is limited by pain.    Diabetes:  Using medications without difficulties:yes  Hypoglycemic episodes:no Hyperglycemic episodes:no Feet problems:see above Blood Sugars averaging: ~160 in AM  Complains of hearing loss with trouble in noisy rooms/background noise.  Persistent sx per patient.   PMH and SH reviewed  Meds, vitals, and allergies reviewed.   ROS: See HPI.  Otherwise negative.    GEN: nad, alert and oriented HEENT: mucous membranes moist, tm wnl NECK: supple w/o LA CV: rrr. PULM: ctab, no inc wob ABD: soft, +bs EXT: no edema SKIN: no acute rash  Diabetic foot exam: Normal inspection No skin breakdown No calluses  Normal DP pulses Dec sensation to light tough and monofilament, stocking/glove distribution with dec in sensation greater on feet than hands.  Nails normal

## 2010-12-03 NOTE — Assessment & Plan Note (Signed)
Refer for second opinion.  S/p cervical fusion.

## 2010-12-03 NOTE — Assessment & Plan Note (Signed)
Worse in noisy rooms.  Refer for eval.

## 2010-12-04 ENCOUNTER — Telehealth: Payer: Self-pay | Admitting: *Deleted

## 2010-12-04 NOTE — Telephone Encounter (Signed)
Pt states that you were going to write him a letter regarding disability, but he says he needs to speak with you before you write this letter. He is asking that you call him today.

## 2010-12-04 NOTE — Telephone Encounter (Signed)
I already wrote the letter.  I couldn't get up with patient using the numbers listed.  Please see if you can contact him and find out what he needs.  Thanks.

## 2010-12-08 ENCOUNTER — Encounter: Payer: Self-pay | Admitting: Family Medicine

## 2010-12-10 ENCOUNTER — Encounter: Payer: Self-pay | Admitting: Family Medicine

## 2010-12-23 ENCOUNTER — Telehealth: Payer: Self-pay | Admitting: *Deleted

## 2010-12-23 NOTE — Telephone Encounter (Signed)
He [place] currently unable to work.  If they need extra details, please have them send me a form with the specific request of information.  Thanks.

## 2010-12-23 NOTE — Telephone Encounter (Signed)
Rep from insurance company is asking if there are any restrictions on pts ability to work.  She is asking about this regarding his disability claim.

## 2010-12-29 NOTE — Telephone Encounter (Signed)
LMOVM advising Stephanie.

## 2011-01-20 NOTE — H&P (Signed)
NAMEOLEY, LAHAIE           ACCOUNT NO.:  [id number]   MEDICAL RECORD NO.:  [id number]          PATIENT TYPE:  EMS   LOCATION:  MAJO                         FACILITY:  [facility]   PHYSICIAN:  Unice Cobble, MD     DATE OF BIRTH:  13-Jul-1961   DATE OF ADMISSION:  02/20/2007  DATE OF DISCHARGE:                              HISTORY & PHYSICAL   CHIEF COMPLAINT:  Chest pain.   HISTORY OF PRESENT ILLNESS:  This is a 50 year old white male with the  history of coronary artery disease, status post PCI, diabetes mellitus,  hypertension, hyperlipidemia, who presents with chest pain.  The patient  has a history of chest pain and was catheterized three days ago and  found to have significant disease in his distal circumflex that was not  amenable to stenting and is being treated medically.  The patient has  not yet been able to fill his medication since his discharge on Friday  and is only taking aspirin, Plavix, and Effexor at this time.  Today,  after a shower, he had substernal chest pressure radiating to his left  shoulder with diaphoresis and shortness of breath which is his anginal  equivalent.  He has no symptoms of congestive heart failure.  His pain  was relieved by nitroglycerin x2 slightly (given by EMS personnel -- he  has not yet filled his own nitroglycerin), and was completely relieved  by nitroglycerin drip here in the emergency department.   PAST MEDICAL HISTORY:  1. Coronary artery disease with history of left anterior descending      Taxus stent in November of 2004 and Taxus stent to the right      coronary artery in March of 2007.  Cardiac catheterization on February 17, 2007 showed a 40% LAD lesion, 80% circumflex lesion at the      bifurcation of the circumflex and obtuse marginal #1, and a 90%      distal circumflex lesion in the distal small vessel.  His right      coronary artery was patent.  Ejection fraction was 55%.  2. History of nephrolithiasis.  3. Left  shoulder surgery in 2008.  4. History of noncompliance secondary to cost.  5. Diabetes mellitus.  6. Hypertension.  7. Dyslipidemia.  8. Obesity.   ALLERGIES:  ACE inhibitors cause cough and Ambien causes amnesia.   MEDICATIONS:  1. Aspirin 81 mg daily.  2. Plavix 75 mg daily.  3. Effexor 150 mg daily.   The following medications are prescribed, but are not being taken:  1. Prilosec.  2. Metformin.  3. Diovan.  4. Coreg.  5. Simvastatin.  6. Nitroglycerin as needed.   SOCIAL HISTORY:  He is married with two children.  He works in a shop as  a Retail banker.  He is a nonsmoker.  No drugs or alcohol.   FAMILY HISTORY:  Coronary artery disease is present in his mother and  father.   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  Other than that, a complete review of systems was done and was  found to be negative, except as mentioned up in the history of present  illness.   PHYSICAL EXAMINATION:  VITAL SIGNS:  Temperature is 98.0, pulse 90,  respiratory rate 16, blood pressure 130/68, O2 saturation was 100% on  two liters.  GENERAL:  This is a white male in no acute distress.  HEENT:  Pupils are equal, round, and reactive to light and  accommodation.  Extraocular movements are intact.  Moist mucous  membranes.  NECK:  Supple without lymphadenopathy, thyromegaly, bruits or jugular  venous distention.  HEART:  Has a regular rate and rhythm with a normal S1 and S2.  He has a  grade 2/6 systolic murmur best heard in the left upper sternal border.  LUNGS:  Clear to auscultation bilaterally without wheezes, rales or  rhonchi.  ABDOMEN:  Soft and nontender with normal bowel sounds.  No rebound or  guarding.  No hepatosplenomegaly.  EXTREMITIES:  Warm and well perfused without clubbing, cyanosis or  edema.  His groin site is healing with no evidence of hematoma.   NEUROLOGICAL:  He is alert and oriented x3 with cranial nerves II  through XII intact.  Strength is 5/5 in all extremities.  Normal  sensation throughout.   ECG shows a rate of 86 with a normal sinus rhythm.  He has nonspecific T-  wave changes which are unchanged from prior.   LABORATORY DATA:  White count is 9.7 with a hemoglobin of 15.6 and a  platelet count of 231,000.  His basic metabolic panel is unremarkable  with the exception of a glucose of 239.  His creatinine is 1.06.  His  AST and ALT are slightly elevated at 42 and 64 with a total bilirubin of  0.4.  Troponin and CK-MB are nondetectable at this time.   ASSESSMENT AND PLAN:  This is a 50 year old white male with coronary  artery disease and a lesion not amenable to PCI, who presents with  anginal equivalent.  I will rule out myocardial infarction tonight with  serial enzymes and ECGs and start the medications he has been unable to  begin due to cost and timing.  His blood pressure is actually almost  normal now and I suspect that he can get away with one blood pressure  medication instead of two.  For this, I will choose metoprolol because  it is the least expensive and has good evidence in coronary artery  disease.  He will receive aspirin and Plavix.  His liver function tests  are mildly elevated which may be due to his in-hospital Simvastatin  dosing and this will need to be checked in followup for further rise.  At this time, I will continue him on his Simvastatin as the elevation is  not more than three-fold the lower normal limit.  He will receive  Lovenox for  deep venous thrombosis prophylaxis and gastrointestinal prophylaxis with  Prilosec.  Sliding scale insulin will be started for diabetes and his  Metformin will be held at this time.  Further decisions on possible PCI  versus bypass surgery versus medical therapy will be decided in the  morning by the primary cardiology team.      Unice Cobble,  MD Electronically Signed     ACJ/MEDQ  D:  02/21/2007  T:  02/21/2007  Job:  [id number]   cc:   Cristy Hilts. [name], MD

## 2011-01-20 NOTE — Op Note (Signed)
Tony Manning, ARREGUIN           ACCOUNT NO.:  [id number]   MEDICAL RECORD NO.:  [id number]          PATIENT TYPE:  AMB   LOCATION:  SDS                          FACILITY:  [facility]   PHYSICIAN:  Berdell Hostetler. Magnus Ivan, M.D.DATE OF BIRTH:  1961/02/06   DATE OF PROCEDURE:  07/08/2007  DATE OF DISCHARGE:  07/08/2007                               OPERATIVE REPORT   PREOPERATIVE DIAGNOSIS:  Left elbow chronic medial epicondylitis.   POSTOPERATIVE DIAGNOSIS:  Left elbow chronic medial epicondylitis.   PROCEDURE:  Left elbow medial epicondylar release and debridement.   SURGEON:  Aydrien Froman. Magnus Ivan, M.D.   ANESTHESIA:  General.   TOURNIQUET TIME:  20 minutes.   BLOOD LOSS:  Minimal.   COMPLICATIONS:  None.   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  This has been going on for well  over a year.  He has had multiple injections in this site.  He wanted to  pursue operative intervention due to the fact that this continued to  bother him.  The risks and benefits of surgery were explained to him,  well understood and agreed to proceed with surgery.   PROCEDURE DESCRIPTION:  After informed consent was obtained, the  appropriate left arm was marked, he was brought to the operating room  and placed supine on the operating table.  General anesthesia was then  obtained.  A nonsterile tourniquet was placed around his upper left arm.  His well arm was placed on the table.  His arm was prepped and draped  from the upper arm down to the wrist with DuraPrep and sterile drapes  including a sterile stocking over it and impervious stockinette around  the wrist.  Timeout was called to identify the correct patient and  correct extremity.  We then used an Esmarch and draped out the arm.  The  tourniquet was inflated to 250 mmHg of pressure.  We then made an  incision just anterior to medial epicondylitis and carried this down  meticulously through the soft tissues to expose the flexor pronator mass  to the medial epicondyle.  There was inflamed appearing tissue in this  area and I debrided it sharply with the knife.  I then released tissue  from around the medial epicondyle and debrided the sharp edge of bone.  I then used a 0.062 K wire and put multiple drill holes into the bone in  this area to generate a bleeding response.  I then thoroughly irrigated  the tissue and closed the tissue with 2-0 Vicryl suture followed by  interrupted 3-0 nylon in the skin.  The wound was infiltrated with 0.25%  plain Marcaine, Xeroform followed by a well padded sterile dressing and  a posterior splint with plaster was placed.  The tourniquet was let down  at 20 minutes and the fingers had pinkened nicely.  The patient was  awakened, extubated and taken to the recovery room in stable condition.  There were no complications.  Final counts were  correct.   Postoperatively, I will leave him in this splint for at least a 2-week  period to completely rest his elbow, then we will follow this with rest,  antiinflammatories and potentially some physical therapy as well.      Vanita Panda. Magnus Ivan, M.D.  Electronically Signed     CYB/MEDQ  D:  07/08/2007  T:  07/08/2007  Job:  [id number]

## 2011-01-20 NOTE — Cardiovascular Report (Signed)
NAMEGEDEON, BRANDOW           ACCOUNT NO.:  [id number]   MEDICAL RECORD NO.:  [id number]          PATIENT TYPE:  OBV   LOCATION:  2022                         FACILITY:  [facility]   PHYSICIAN:  Cristy Hilts. [name], MD       DATE OF BIRTH:  1961-04-25   DATE OF PROCEDURE:  02/17/2007  DATE OF DISCHARGE:                            CARDIAC CATHETERIZATION   PROCEDURE PERFORMED:  1. Left ventriculography.  2. Selective right and left coronary arteriography.  3. Right femoral angiography and closure of the right femoral arterial      access with StarClose.   INDICATIONS:  [name] is a 45-year unfortunate male with  diabetes, hypertension and hyperlipidemia  who has history of coronary  artery disease and has had angioplasty to his LAD with implantation of a  2.75 x 12-mm Taxus stent in 2004, and was admitted with unstable angina  and non-Q-wave myocardial infarction in March 2007 and underwent PTCA  and stenting of distal circumflex coronary artery with implantation of a  2.5 x 12-mm Taxus.  He has undergone cutting balloon angioplasty to  obtuse marginal one vessel in 2004.  He was readmitted to the hospital  after having stopped all his medication, which is the third time he has  stopped his medications in the past.  Presents with unstable angina.  Given his significant symptomatology suggestive of unstable angina, he  was brought to the catheterization laboratory to reevaluate his coronary  anatomy.   HEMODYNAMIC DATA:  The ventricle pressure was 138/14 with end diastolic  pressure of 20 mmHg.  The aortic pressure was 140/90 with a mean of 112  mmHg.  There was no pressure gradient across the aortic valve.   ANGIOGRAPHIC DATA:  Left ventricle:  Left ventricular systolic function  was normal with an ejection fraction of 55%.  There was no regional wall  motion abnormality.  There was no significant mitral regurgitation.   Right coronary artery:  The right coronary artery  is a large-caliber  vessel and a dominant vessel.  It has got mild luminal irregularity.  The previously placed Taxus stent in 2007 is widely patent in the distal  RCA.   Left main:  The left main is large-caliber vessel.  It is smooth and  normal.   LAD:  The LAD is large-caliber vessel in the proximal segment.  However,  it tapers down rapidly from mid to distal segment.  Gives origin to a  large diagonal one.  There is a stent just distal to the diagonal one.  The segment of the LAD just proximal to the stent and just before the  origin of the diagonal one, has a step-up and a step-down with a 40%  stenosis which was previously noted in 2004 and again in 2007, and this  has remained unchanged.  The LAD stent is widely patent.  The LAD has  diffuse disease in the mid to distal segment.   Circumflex:  The circumflex is a large-caliber vessel.  It gives origin  to a high obtuse marginal one and after the high obtuse marginal one  origin, the circumflex  in the mid segment has an 80% ostial narrowing at  the bifurcation of circumflex and obtuse marginal one.  This is  unchanged from 2004.  However, there is a new lesion which is about 80-  90% in the distal circumflex and is hazy.  THIS IS THE CULPRIT VESSEL.   The obtuse marginal two is a small to moderate caliber vessel and got  mild luminal irregularity.   IMPRESSION:  The culprit lesion is the distal circumflex stenosis of 80-  90% which is hazy.  However, this vessel is very small and measuring  about less than 2 mm in diameter.  The 80% stenosis at the mid segment  of the circumflex where just after the origin of large OM-1 and distal  circumflex this ostium is unchanged from prior cardiac catheterization.   RECOMMENDATIONS:  I suspect the progression of coronary artery disease  os secondary to his underlying comorbidity with continued cardiac risk  factors.  He needs to be on aggressive medical therapy.  Angioplasty of  the  circumflex is probably not advisable, given the fact that it is a  very small vessel.  Hence, the patient will be taken off the table and  will keep him in the hospital today and discuss regarding medical  compliance.   A total of 126 mL of contrast was utilized for diagnostic angiography.   TECHNIQUE OF THE PROCEDURE:  Under the usual sterile precautions, a 6-  [place] multipurpose B2 catheter was utilized to perform left  ventriculography in the LAO and RAO projection.  This was done after the  catheter was advanced into left ventricle over a J-wire.  Then, the same  catheter was utilized to engage the right coronary artery and left main  coronary artery and angiography was performed.  A total of 400 mcg of  intracoronary nitroglycerin was administered and angiography repeated.  Then, the catheter was pulled out of the body.   Right femoral angiography was performed through the arterial access  sheath and access closed with StarClose with excellent hemostasis.  No  immediate complication noted.      Cristy Hilts. [name], MD  Electronically Signed     JRG/MEDQ  D:  02/17/2007  T:  02/17/2007  Job:  [id number]   cc:   Reuben Likes, M.D.

## 2011-01-20 NOTE — Op Note (Signed)
Tony Manning, Tony Manning           ACCOUNT NO.:  [id number]   MEDICAL RECORD NO.:  [id number]          PATIENT TYPE:  INP   LOCATION:  3028                         FACILITY:  [facility]   PHYSICIAN:  Kerrin Champagne, M.D.   DATE OF BIRTH:  01/17/1961   DATE OF PROCEDURE:  DATE OF DISCHARGE:                               OPERATIVE REPORT   PREOPERATIVE DIAGNOSES:  1. Herniated nucleus pulposus central right-sided C6-7 spondylosis      changes.  2. Foramen entrapment  right C5-6.  3. Mild degenerative disk changes C4-5.   POSTOPERATIVE DIAGNOSES:  1. Herniated nucleus pulposus central right-sided C6-7 spondylosis      changes.  2. Foramen entrapment right C5-6.  3. Mild degenerative disk changes C4-5.   PROCEDURE:  1. Anterior cervical diskectomy and fusion C5-6 with a 7-mm trans      graft and local bone graft.  2. Anterior cervical diskectomy and fusion C6-7 with 8-mm trans graft      and local bone graft.  3. Plate fixation from C5 to C7 utilizing a 37-mm trestle Alphatec      plate with 11-BJ screws.   SURGEON:  Dr. Vira Browns.   ASSISTANT:  Skip Mayer, PA-C.   ANESTHESIA:  General via orotracheal intubation, Dr. Kipp Brood,  supplemented with local infiltration Marcaine 0.5%, 1 to 200,000  epinephrine 5 mL.   DRAINS:  Foley straight drain used during operative case, removed at the  end of the case.  A 10-French TLS drain placed in the depth of the  incision anteriorly.  This is sewn in place.   COMPLICATIONS:  None.   FINDINGS:  The patient was found to have a large disk herniation at C6-  7, central with some extension both left and right side primarily into  the right neuroforamen affecting the right C7 nerve root.  Spondylosis  changes and degenerative disk disease C5-6, spur extending into the  right neuroforamen, and the second in the right C6 nerve root.   DESCRIPTION OF PROCEDURE:  After adequate general anesthesia, the  patient had Foley catheter placed,  was transferred to the operating room  table.  A Mayfield horseshoe to rest the head on a rolled towel between  the shoulder blades to accentuate some extension at the cervical dorsal  junction by bound cervical Halter traction.  Standard preoperative  antibiotics, standard prep with DuraPrep solution over the anterior  aspect of the neck.  He was draped in the usual manner,  Ioban Vi-Drape  was used.  An incision over the left anterior neck was made in the area  marked previous to the patient being taken back to the operating room.  This area was marked and an X made and initialed for correct levels C5-  6, C6-7 preoperatively.  Time-out was made at the beginning of the case,  and the description of the procedure, risks involved, expected  instrumentation, blood loss, and any concerns regarding the patient's  medical health were issued at this time.  The patient then underwent  initial incision over the left side of the neck at the expected C6  level.  This is in line with the patient's skin crease, on the left side  incision approximately 3-1/2 inches in length, about 2 fingerbreadths  above the medial clavicle.  The skin was infiltrated with Marcaine 0.5%  , 1:200,000 epinephrine.  The bleeders controlled using bipolar and  monopolar electrocautery.  Incision then made through the platysma  layer, down deep to the platysma layer a small vein cauterized.  Incision made over the fascial layer with the interior aspect of the  sternocleidomastoid and also medial fascia here.  Blunt dissection then  used to develop the interval between the trachea and esophagus medially  and the carotid sheath laterally to the anterior aspect of the cervical  spine.  The patient's prevertebral fascia was then cauterized in the  midline and to the right of the left side longus colli muscle using  bipolar electrocautery and then teased across the midline using a Scientist, forensic.  The carotid tuberosity was  felt representing C6 and level  above this and marked using a spinal needle with sheath intact only  allowing a 7-mm protrude.  The surgeon entered into the anterior aspect  of the disk space at C5-6.  Additional needle placed in the disk space  below this expect at C6-7 level.  Intraoperative lateral radiograph  demonstrated the needle at the at C5-6 level superior.  However, the  inferior needle not able to be seen well due to the patient's shoulders;  however, this was the next expected disk space as the upper level was  judged to be C5-6, felt to be correct levels intraoperatively.  Hand  held Clowards  were introduced.  Individual spinal needles were removed  under direct observation.  Note, that loupe magnification and headlamp  were used during this portion of the procedure and a small portion of  the disk excised using 15 blade scalpel and pituitary rongeur at C5-6  and then at C6-7.  Medial border of longus colli muscle was then  carefully developed bilaterally using electrocautery and elevators.  Boss McCullough retractor then placed at the C6-7 level and used for  distraction.  Note, the foot of  the retractor was placed beneath the  medial border, in longus colli muscle on both sides.  The esophagus  carefully protected.   The anterior prevertebral soft tissue carefully cauterized in order to  remove from the anterior cervical structures down to bone.  A 14-mm  screw post was then placed in the anterior aspects of vertebral body of  C7.  Additional screw posts were placed parallel to the first into the  vertebral body at C6 distraction obtained across disk space.  Anterior  lip osteophytes were resected using 3-mm Kerrisons preserving the bone  for later bone grafting purposes.  The disk was excised using pituitary  rongeurs, curettage carried out using microcurettes. The disk had been  cleared out completely posteriorly to the posterior annular fibers.  The  operating room   microscope was then sterilely draped and brought into  the field.  Under the operating room microscope.  Then the posterior  annular fibers were resected.  A 2-0 microcurette used to carefully move  bone from posterior lip osteophytes over the posterior superior aspect  to C6 centrally, and then 1-mm and 2-mm Kerrisons introduced to remove  posterior lip osteophytes on the right side over the posterior superior  aspect of C7 and the posterior inferior aspect of C6, out into the  neuroforamen where a foraminotomy was performed here.  During the  process of removing of disk material, then from the posterior right  corner, large disk fragment was excised that extended into the neural  foramen through a rent in the posterior longitudinal ligament on the  right side.  Following along this area, 1 to 2-mm Kerrisons were used to  remove the posterior longitidunal ligament.  Further disk material was  found here extending into the neural foramen affecting the right C7  nerve root.  Further foraminotomy in the C7 nerve root was then well  decompressed.  The posterior and longitudinal ligament was then removed  centrally and towards the left side where additional large fragments of  disk material including several cartilaginous pieces were removed from  the spinal canal that had been causing some central cord compression.  Posterior lip osteophytes were debrided as best as possible at this  point.  High-speed burr then used carefully to remove bleeding bone  surfaces over  the inferior end plate of C6, superior end plate of C7.  This bone was also carefully preserved as was the foraminotomy bone.  The depth of the intervertebral disk space measured at 18-mm from  anterior to posterior.  Height measured using sounding devices 7 mm,  then 8 mm provided the best fit.  An 8-mm trans graft was then obtained.  This was packed centrally with local bone graft harvested from  osteophytes and decompression  during this procedure at the C6-7 level.  The end plates carefully burred and small openings made into the  endplates.  Graft was then placed over the intervertebral disk space and  a graft depth measuring 11 mm.  This was then packed into place and  subset beneath the anterior aspect of the intervertebral disk space by 1  or 2 mm.  Next, the screw post was removed at the C7 level.  Bone wax  applied to the plain screw post hole.   The [facility] retractor was then carefully removed and brought up  to the C5-6 level with distraction was similarly obtained here.  The  medial longus colli muscle acting as the border for the foot of the  blade of the McCullough retractor.   Screw pouch was then placed parallel to that and C6 at the C5 level  using 14-mm screw post.  Distraction obtained across the disk space.  The disk then excised using pituitary rongeurs as well as microcurettes  removing any cartilaginous endplates as well as degenerative disk  material here.  Anterior aspect of vertebral body at C5 and also C6  carefully burred and carefully smoothed at C6-7 as well prior to going  to C5-6 level was done under a microscope.  Under operating room  microscope then anterior lip osteophytes were resected.  Note that a  Matt Holmes rongeur was used for removing anterior lip osteophytes over the  superior anterior aspect of C6 over the inferior aspect of C5 a 3-mm  Kerrison was used.   The intervertebral disk material was excised down to leaving cancellous  bone bleeding cortical bone over the end plates of U9-8.  This is  carried back posterior to the posterior lip osteophytes.  Under the  operating room microscope, the posterior lip osteophytes were resected  first revealing a small portion of bone of the posterior superior aspect  of C6, then using 1-mm Kerrison then removed further lip osteophytes  extended in the right side where great deal of disk and bone material is  extending into  the right  C6 neuroforamen. Foraminotomy was performed to  the right C6 nerve into the nerve root was observed exiting anterior and  lateral without any further impression.  This completed then the right  side of the cord was well decompressed.  The height of the  intervertebral disk base measured at 7-mm in depth measured at again 17  to 18-mm.  Graft of 11-mm of depth and a height of 7 mm was then packed  with bone graft.  It was obtained from the osteophytes at the  intervertebral disk level at C5-6.  End plates carefully burred, and any  bone dust was also used for packing the central portion of the  intervertebral disk space.  All soft tissue removed and retropulsion of  soft tissue could occur with insertion of graft was then packed into  place subsequently beneath the anterior border by about 1 or 2-mm.  Anterior burring was performed at C5-6, C6-7 in order to carefully  smooth any anterior lip osteophytes and allow the plate to sit carefully  against the bone at C5-6 to the inferior aspect of C5 as given twice its  length, measuring 36.  A 37-mm plate was chosen.  This plate was then  placed over the anterior aspects of the cervical spine and carefully  centered.  A single retaining pin placed on the left side at C6.  First,  the inferior screws were placed at the C7 level, drilling with a 16-mm  hand-held drill.  Also, placing a 16-mm screw on the right side at C6.  C7 screws replaced then and then the C5 screws placed.  Final screw on  the left side at C6 level drilling to 16-mm placed on 16-mm screw.  Note  that all screws obtained excellent purchase.   Plate was carefully nailed to anterior aspect of C5-6 and C6-7 in good  position and alignment.  Bleeders  controlled using monopolar  electrocautery.  Soft tissue allowed to fall back into place after  irrigation.  C-arm fluoro was used to carefully observe the plate in the  midline.  Obliquely oriented views were necessary to  observe the length  of the screws that appeared to be of adequate length to obtain purchase,  but did not show any significant impingement on the spinal canal on  lateral view seen.  Additional radiographs were to be obtained in the  recovery room.  With this done a further irrigation was carried out.  Small bleeders controlled using a bipolar electrocautery.  A 10-French  TLS drain placed in the depth of the incision, sewn over the right  anterior neck.  The platysma layer reapproximated with interrupted 2-0  Vicryl sutures.  Subcutaneous layer was approximated with interrupted 3-  0 Vicryl sutures and skin closed with Dermabond.  A 4x4 was affixed to  the skin with sterile Hypafix tape.  The patient had [place]  collar applied.  Foley catheter was then removed, and the TLS drain  charged.  The patient was then reactivated, extubated, and returned to  the recovery room in satisfactory condition.  All instruments and sponge  counts were correct.      Kerrin Champagne, M.D.  Electronically Signed     JEN/MEDQ  D:  12/29/2007  T:  12/30/2007  Job:  [id number]

## 2011-01-20 NOTE — H&P (Signed)
Tony Manning, Tony Manning           ACCOUNT NO.:  [id number]   MEDICAL RECORD NO.:  [id number]          PATIENT TYPE:  OBV   LOCATION:  2022                         FACILITY:  [facility]   PHYSICIAN:  Tony Hilts. [name], MD       DATE OF BIRTH:  1961-08-20   DATE OF ADMISSION:  02/16/2007  DATE OF DISCHARGE:                              HISTORY & PHYSICAL   CHIEF COMPLAINTS:  Chest pain.   HISTORY OF PRESENT ILLNESS:  The patient is a 50 year old male with a  history of coronary disease.  He had a Taxus stent to the LAD in  July 30, 2003.  He had an RCA Taxus placed in March 2007.  He had a  Myoview done recently in January 2008 prior to having some shoulder  surgery.  Myoview was actually abnormal with some ischemia but Dr. Jacinto Manning  felt it was low risk, and the patient was cleared for surgery.  He has  done well from a cardiac.  Standpoint until this morning.  Today, he  became upset when he could not find something at home and developed  substernal chest tightness with shortness of breath.  He then developed  some sharp left neck pain and fell to the floor.  Symptoms are better  now in the emergency room.  On interview, the patient admits he stopped  all his medications a month ago because they were too expensive.   CURRENT MEDICATIONS:  Are none for a month, but previously he had been  on:  1. Aspirin 325 daily.  2. Coreg 12.5 mg b.i.d.  3. Crestor 40 mg a day.  4. Diovan 160 mg a day.  5. Hytrin 10 mg a day.  6. Metformin 500 mg b.i.d.  7. Plavix 75 mg a day.  8. Effexor 225 daily.   ALLERGIES:  HE IS INTOLERANT TO ACE INHIBITORS WHICH CAUSED A COUGH AND  AMBIEN WHICH CAUSED AMNESIA IN THE PAST.   SOCIAL HISTORY:  He is married.  He has two children.  He is a  nonsmoker.  He works in a shop as a Retail banker.   FAMILY HISTORY:  His mother and father both have coronary disease.   REVIEW OF SYSTEMS:  Remarkable for nephrolithiasis.  He also had left  shoulder surgery by  Dr. [name] in January 2008 and again in April  2008.   PHYSICAL EXAMINATION:  VITAL SIGNS:  Blood pressure 128/78, pulse 71,  temperature 97.  Room air sat is 98%.  GENERAL:  He is a well-developed, well-nourished male in no acute  distress.  HEENT:  Normocephalic.  Extraocular movements intact.  NECK:  Without JVD or bruit.  Thyroid is not enlarged.  CHEST:  Clear to auscultation percussion.  CARDIAC:  Reveals regular rate and rhythm without murmur or gallop.  Normal S1, S2.  ABDOMEN:  Obese, nontender.  Bowel sounds are present.  Extremities: Without edema.  He has no femoral bruits.  Pulses are 3+/4.  NEUROLOGICAL:  Grossly intact.  He is awake, alert and oriented,  operative.  Moves all extremities without obvious deficit.  SKIN:  Warm and dry.  LABORATORY DATA:  Sodium 138, potassium 3.8, BUN 15, creatinine 0.8,  glucose 197.  White count 8.5, hemoglobin 15.7, hematocrit 46, platelets  229.  His troponin is negative.  His EKG shows sinus rhythm without  acute changes.   IMPRESSION:  1. Unstable angina.  2. Coronary disease with LAD Taxus stenting November 2004, RCA Taxus      stenting March 2007 with abnormal Myoview January 2008.  3. Noncompliance.  4. Non-insulin-dependent diabetes.  5. Hypertension.  6. Dyslipidemia.  7. Obesity.   PLAN:  The patient will be admitted to telemetry, started on heparin.  We will resume his Statin, Diovan, Plavix, aspirin and metoprolol.  For  now, will hold off on his Hytrin, metformin and Effexor.  Will keep his  medications generic wherever possible.  He probably needs to be  restudied.      Abelino Derrick, P.A.      Tony Hilts. [name], MD  Electronically Signed    LKK/MEDQ  D:  02/16/2007  T:  02/17/2007  Job:  [id number]

## 2011-01-20 NOTE — Discharge Summary (Signed)
Tony Manning, Tony Manning           ACCOUNT NO.:  [id number]   MEDICAL RECORD NO.:  [id number]          PATIENT TYPE:  INP   LOCATION:  3703                         FACILITY:  [facility]   PHYSICIAN:  Cristy Hilts. [name], MD       DATE OF BIRTH:  11/23/1960   DATE OF ADMISSION:  02/20/2007  DATE OF DISCHARGE:  02/22/2007                               DISCHARGE SUMMARY   DISCHARGE DIAGNOSES:  1. Recurrent angina, plan for medical therapy.  2. Noncompliance.  3. Known coronary disease.  4. Non-insulin diabetes.  5. Dyslipidemia with elevated liver function tests on simvastatin.   HOSPITAL COURSE:  The patient is a 50 year old male who has a history of  coronary disease.  He has had a previous intervention in March of 2007  with an RCA stent placement.  Recently, he was admitted to [facility]  with chest pain.  He had been off all his medications because he could  not afford them.  His medications were resumed.  He was cathed and had  moderate disease and was treated medically.  Unfortunately, after he was  discharged, he again did not fill all of his prescription and had  recurrent chest pain.  He was seen in the emergency room on February 20, 2007 and readmitted.  Enzymes were negative.  His medications were  adjusted.  We feel he can be discharged on February 22, 2007.   DISCHARGE MEDICATIONS:  1. Plavix 75 mg a day.  2. Effexor 150 mg a day.  3. Diovan 80 mg a day.  4. Metformin 500 mg twice a day.  5. Aspirin 81 mg 2 tablets a day.  6. Nitroglycerin sublingual p.r.n.  7. Coreg 12.5 mg 1/2 tablet twice a day.  8. His simvastatin has been cut back to 40 mg a day.   LABORATORY DATA:  Troponins were negative.  LFTs are elevated with an  AST of 42, ALT of 64.  These were also elevated on his previous  admission.  Renal profile shows a sodium of 137, potassium 4.6, BUN 12,  creatinine  0.9, white count 9.7, hemoglobin 14.8, hematocrit 44.0, platelets 219.  Chest x-ray revealed no acute process.   EKG shows sinus rhythm with no  acute changes.   DISPOSITION:  The patient discharged in stable condition, and he will  follow-up with Dr. [name] in a couple weeks.      Abelino Derrick, P.A.      Cristy Hilts. [name], MD  Electronically Signed    LKK/MEDQ  D:  02/22/2007  T:  02/22/2007  Job:  [id number]

## 2011-01-20 NOTE — Discharge Summary (Signed)
Tony Manning, Tony Manning           ACCOUNT NO.:  [id number]   MEDICAL RECORD NO.:  [id number]          PATIENT TYPE:  OBV   LOCATION:  2022                         FACILITY:  [facility]   PHYSICIAN:  Cristy Hilts. [name], MD       DATE OF BIRTH:  02-Sep-1961   DATE OF ADMISSION:  02/16/2007  DATE OF DISCHARGE:  02/18/2007                               DISCHARGE SUMMARY   DISCHARGE DIAGNOSES:  1. Unstable angina.  2. Stable coronary disease with history of left anterior descending      Taxus stenting, November, 2004, and RCA Taxus stenting, March of      2007, now with stable coronary arteries.   DISCHARGE CONDITION:  Stable.   PROCEDURES:  February 17, 2007:  Combined left heart catheterization by Dr.  Cristy Hilts. Ganji.   DISCHARGE MEDICATIONS:  1. Aspirin 81 mg daily.  2. Plavix 75 mg daily.  3. Diovan 80 mg daily.  4. Prilosec over the counter 20 mg daily.  5. No metformin until Saturday evening, February 19, 2007, then 500 mg      twice a day.  6. Effexor 150 mg daily.  7. Coreg 6.25 mg, one twice a day.  8. Simvastatin 80 mg daily.  9. Nitroglycerin sublingual p.r.n. chest pain.   DISCHARGE INSTRUCTIONS:  1. Increase activity slowly.  2. May walk up steps.  3. May shower bathe.  4. No lifting for two days.  5. No driving for two days.  6. Low-sodium, heart healthy diabetic diet.  7. Wash right groin cath site with soap and water.  Call if any      bleeding, swelling or drainage.  8. Follow up with Dr. [name] in the office; call with exact time.  9. Stop Crestor and Hytrin.   HISTORY OF PRESENT ILLNESS:  This 50 year old white male with history of  coronary disease with Taxus stent to LAD in November of 2004 and an RCA  Taxus placed in March of 2007, [place] in January of 2008 with slightly  abnormal ischemia, but Dr. [name] felt it was low risk and he was cleared  for surgery.  He has done well from a cardiac standpoint until February 16, 2007; he became upset when he could not find something  at home,  developed substernal chest pain and some shortness of breath.  He then  developed some sharp left neck pain and fell to the floor.  He was  improved in the emergency room at [facility], but he did admit that he had  stopped his meds a month ago because they were too expensive.  Outpatient meds had been aspirin 325 mg, Coreg 12.5 b.i.d., Crestor 40,  Diovan 160, Hytrin 10, metformin 500 b.i.d., Plavix 75 and Effexor, he  was taking 150 a day.   ALLERGIES:  HE IS INTOLERANT TO ACE-INHIBITORS, WHICH CAUSE A COUGH, AND  AMBIEN, WHICH HAS CAUSED AMNESIA IN THE PAST.   SOCIAL HISTORY/FAMILY HISTORY/REVIEW OF SYSTEMS:  See H&P.   PHYSICAL EXAM AT DISCHARGE:  VITAL SIGNS:  Blood pressure 124/76, pulse  60, respirations 20, temperature 96.1, oxygen saturation on room air  99%.  GENERAL:  On my exam, the patient is alert and oriented.  RIGHT GROIN SITE:  No hematoma.  HEART/LUNGS:  Clear.   LABORATORY DATA:  Hemoglobin on admission was 15.7, hematocrit 46.  WBC  8.5, platelets 229.  These remained stable through hospitalization.  Chemistries:  Sodium 139, 4.7, chloride 105, CO2 28, BUN 13, creatinine  0.93, glucose 165.  These were stable, except glucose was slightly  elevated.  Coags:  PT 12.7, INR of 0.9, PTT 27.  These remained stable  on heparin and was therapeutic.   AST 41, ALT 54, alkaline phosphatase 78, total bilirubin 1.1, albumin  4.5.  Current enzymes were negative.  CK is 158, 133 and 120.  MB is 2.2  and 1.8 on February 12, 2007.  Troponin-I is 0.01 to 0.02.   Cholesterol 187, LDL 123, HDL 32 and triglycerides 162.  Calcium 9.3.  The UA was clear.  TSH was 0.933.   Chest x-ray:  Postop changes of the right lower lobe, surgical clips due  to previous resection.  No acute abnormality otherwise.   Cardiac catheterization:  February 17, 2007.  He did have some progression  of his coronary disease.  The culprit lesion in the distal circumflex  stenosis with 80-90%, which is hazy.   Unfortunately, this vessel is very  small and measured about less than 2 mm in diameter.  The 80% stenosis  at the mid segment of the circumflex was just after the origin of the  large OM1 and distal circ.  This ostium was unchanged from prior cardiac  cath.  Dr. [name] felt aggressive medical therapy was the treatment of  choice, and important good medical compliance.   HOSPITAL COURSE:  The patient was admitted on February 16, 2007, by Dr.  Jenne Campus, on call for Dr. [name].  He was admitted to rule out myocardial  infarction with unstable angina.  Medications continued to be held,  though we slowly started his meds back.  He was on IV heparin and was  stable.  On February 17, 2007, he underwent cardiac catheterization, with  results stable, and today, February 18, 2007, he was ambulating without  problems.  We have given  him as many generic meds as possible.  We gave him 14 days' free supply  of Plavix and we will attempt to help him get Diovan in the office, as  well.  The rest of the meds were generic.  I did refill his metformin,  but he will see Dr. Lorenz Coaster back as he is having gastrointestinal  complaints with metformin, as well.      Darcella Gasman. [place], N.P.      Cristy Hilts. [name], MD  Electronically Signed    LRI/MEDQ  D:  02/18/2007  T:  02/18/2007  Job:  [id number]   cc:   Darlin Priestly, MD  Reuben Likes, M.D.

## 2011-01-23 NOTE — Cardiovascular Report (Signed)
NAME:  Tony Manning, Tony Manning                     ACCOUNT NO.:  [id number]   MEDICAL RECORD NO.:  [id number]                   PATIENT TYPE:  INP   LOCATION:  4740                                 FACILITY:  [facility]   PHYSICIAN:  Cristy Hilts. [name], M.D.                  DATE OF BIRTH:  1960/09/25   DATE OF PROCEDURE:  08/08/2003  DATE OF DISCHARGE:                              CARDIAC CATHETERIZATION   PROCEDURE PERFORMED:  1. Left ventriculography.  2. Selective right and left coronary arteriography.  3. Intracoronary nitroglycerin administration.  4. Aortic root arteriography.   INDICATION:  [name] is a 50 year old Caucasian male with  family history of premature coronary artery disease, history of  hyperlipidemia with LDL with 201 who had an abnormal Cardiolite stress test  on an outpatient basis and underwent diagnostic cardiac catheterization at  [facility] on July 30, 2003 including cutting balloon  angioplasty of his obtuse marginal one branch with a 2.0 x 6-mm cutting  balloon and also percutaneous transluminal coronary angioplasty and stenting  of the mid LAD with a 2.75 x 12-mm Taxus deployed on July 30, 2003 was  doing well until this morning.  Had an episode of severe chest pain which he  rated as 9/10 in intensity.  This was associated with shortness of breath  for which he took two sublingual nitroglycerin with partial relief of chest  pain and he came to the emergency room.  In the emergency room, he was still  having persistent chest pain.  Given his history of PCI which was recent, he  was brought directly to the cardiac catheterization lab to evaluate his  coronary anatomy.  Ascending aortogram was performed to evaluate for aortic  dissection.   HEMODYNAMIC DATA:  1. The left ventricular pressure was 130/7 with end-diastolic pressure of 13     mmHg.  2. Aortic pressure 133/82 with a mean of 106 mmHg.  3. There was no pressure gradient  across the aortic valve.   ANGIOGRAPHIC DATA:  Left ventricle:  Left ventricular systolic function was  normal and ejection fraction was estimated at 65%.   Left main coronary:  Left main coronary is a large caliber vessel.  It is  normal.   Circumflex coronary artery:  Circumflex coronary artery is a moderate size  vessel.  The distal circumflex has diffuse disease.  Gives origin to large  obtuse marginal one.  The proximal OM-1 which had 90% stenosis for which he  had undergone cutting balloon angioplasty is widely patent.   Left anterior descending artery:  Left anterior descending artery is large  caliber vessel.  It gives origin to moderate to large size diagonal one and  moderate size diagonal two.  Just after the origin of diagonal two, there is  a previously placed 2.75 x 12-mm Taxus stent that is widely patent.  The  ostium at the diagonal two has been jeopardized  and has an 80-90% stenosis  which is unchanged from angioplasty.  There was no evidence of dissection  nor evidence of thrombus noted at the stent site.   Right coronary artery:  Right coronary artery is a large caliber vessel.  It  gives origin to large PDA and large PLV branches.  Mid RCA has a long  diffuse 30% stenosis.   IMPRESSION:  1. Normal left ventricular systolic function.  Ejection fraction 65%.  No     significant mitral regurgitation.  Normal EDP.  2. Widely patent POBA cutting balloon angioplasty site in the obtuse     marginal one of circumflex coronary artery.  3. Widely patent mid left anterior descending stent.  No change in the small     stent jailed diagonal two.  There was no thrombus visualized in the left     anterior descending.  4. Chest pain probably secondary to either vasospasm or secondary to     gastroesophageal reflux disease.   RECOMMENDATIONS:  The patient will be admitted to the hospital with unstable  angina- myocardial infarction will be ruled out with followup of CPKs and   troponins.  The patient will be started on low dose of Imdur.  This is for  vasospasm.  Continued aggressive risk factor modification is indicated.   TECHNIQUE OF PROCEDURE:  Under usual sterile precautions, using a 6 French  right femoral arterial access, a 6 [place] multipurpose pigtail catheter was  advanced into the ascending aorta over a 0.035-inch J wire.  The catheter  was then gently advanced to the left ventricle and left ventricular  pressures were monitored.  Hand contrast injection of the left ventricle was  performed both in the LAO and RAO projection.  The catheter was flushed with  saline and pulled back into the ascending aorta and pressure gradient across  the aortic valve was monitored.  The right coronary artery was selectively  engaged and angiography was performed.  In a similar fashion, left main  coronary artery was engaged and angiography was performed.  12 mcg of  intracoronary nitroglycerin was also administered.  Then, the catheter was  disengaged from the left main and pulled back into the ascending aorta and  ascending aortogram was performed.  Then, the catheter was pulled out of the  body in the usual fashion.  The patient tolerated the procedure well.                                               Cristy Hilts. [name], M.D.    Pilar Plate  D:  08/08/2003  T:  08/08/2003  Job:  [id number]   cc:   [facility]

## 2011-01-23 NOTE — Discharge Summary (Signed)
NAMETAISEI, BONNETTE           ACCOUNT NO.:  [id number]   MEDICAL RECORD NO.:  [id number]          PATIENT TYPE:  INP   LOCATION:  6524                         FACILITY:  [facility]   PHYSICIAN:  Cristy Hilts. [name], MD       DATE OF BIRTH:  [birth year]   DATE OF ADMISSION:  11/18/2005  DATE OF DISCHARGE:  11/20/2005                                 DISCHARGE SUMMARY   DISCHARGE DIAGNOSES:  1.  Coronary artery disease, status post stenting of the right coronary      artery during this admission by Dr. Jenne Campus.  2.  Hypertension.  3.  Hyperlipidemia.  4.  Obesity  5.  Depression.  6.  Nephrolithiasis by history.   HOSPITAL PROCEDURE:  Coronary angiograph performed by Dr. Jenne Campus on November 19, 2005. It revealed 80% stenosis of the ICA. The patient underwent  stenting of the RCA with Taxus stent with reduction of the lesion from 80%  to less than 10%. He had a patent distal LAD stent. His EF was normal at  60%. Procedure was performed by Dr. Jenne Campus.   HOSPITAL COURSE:  This is a 50 year old gentleman patient of Dr. [name] with  prior history of coronary artery disease and stenting of the LAD also in  October 2006 and angioplasty to first obtuse marginal in November 2004, who  was seen in the emergency room with substernal chest pain. He was admitted  on a rule out MI protocol. His cardiac enzymes were 96 and the patient was  ruled out for myocardial infarction by negative enzymes and was considered  for cardiac catheterization for definitive diagnosis because of the nature  of his pain and a prior history of angioplasty. The cath was performed on  November 19, 2004. For a full report on this procedure please see the chart for  full dictation.   The next morning the patient was assessed by Dr. [name]. He was stable from a  cardiovascular standpoint. His vital signs were within normal limits.  Bloodwork revealed normal hemoglobin of 14.7 and hematocrit of 42.2. His  white blood cell count was  normal at 8.3, platelet 214,000. His BUN was 10,  creatinine 1.0, potassium 3.5, sodium 138. TSH was 1.602.   Groin site did not reveal any hematoma, no oozing, but just mild ecchymosis.  The patient was discharged home with the following instructions: To avoid  driving, no lifting heavy weight greater than 5 pounds for three days,  continue on a low-fat and low-cholesterol diet.  He is to increase activity  slowly. He is to report to Dr. [name] any problems with groin site after  cath.   DISCHARGE MEDICATIONS:  1.  Aspirin 325 mg daily.  2.  Coreg 12.5 mg b.i.d.  3.  Crestor 40 mg daily.  4.  Effexor 225 mg daily.  5.  Plavix 75 mg daily.  6.  Hytrin 2 mg daily.   FOLLOWUP:  An appointment scheduled with Dr. [name] on December 01, 2005, at  10:15 a.m.      Raymon Mutton, P.A.      Cristy Hilts. [name], MD  Electronically Signed    MK/MEDQ  D:  01/01/2006  T:  01/02/2006  Job:  [id number]   cc:   Cristy Hilts. [name], MD  Fax: [phone]   Reuben Likes, M.D.  Fax: [phone]

## 2011-01-23 NOTE — Op Note (Signed)
Tony Manning, Tony Manning           ACCOUNT NO.:  [id number]   MEDICAL RECORD NO.:  [id number]          PATIENT TYPE:  AMB   LOCATION:  SDS                          FACILITY:  [facility]   PHYSICIAN:  Rodney A. Mortenson, M.D.DATE OF BIRTH:  September 09, 1960   DATE OF PROCEDURE:  09/16/2006  DATE OF DISCHARGE:                               OPERATIVE REPORT   JUSTIFICATION:  A 50 year old male who had a long history of recurrent  left shoulder pain.  He has had several injections and initially these  were quite helpful.  These became ineffective, acute pain __________  testing.  MRI was done and there appeared to be a full thickness tear of  the rotator cuff which was nondisplaced.  There was a type II acromion.  AC joint, biceps tendon and labrum all normal.  Because of persistent  pain and discomfort, he is now admitted for surgical correction.  Complications discussed preoperatively.  Questions answered and  encouraged.  No guarantee was given as to final outcome.   PREOPERATIVE DIAGNOSIS:  Full-thickness tear supraspinatus left  shoulder.   POSTOPERATIVE DIAGNOSIS:  Full-thickness tear supraspinatus left  shoulder.   OPERATION:  Diagnosis arthroscopy; open acromioplasty and open repair of  nondisplaced rotator cuff tear left shoulder.   SURGEON:  Lenard Galloway. Tony Manning, M.D.   ANESTHESIA:  General.   PROCEDURE:  The patient placed on the operating table in the supine  position.  After satisfactory general anesthesia, the patient was placed  in semi-sitting position.  Left upper extremity and shoulder was prepped  with DuraPrep and draped out in the usual manner.  Through the standard  posterior arthroscopic portal, the arthroscope was introduced into the  shoulder.  A drainage portal was made anteriorly.  Very careful  examination of the shoulder was undertaken.  Articular cartilage of the  humeral head and the glenoid was absolutely normal.  Anterior labrum was  normal.  The biceps  appeared normal.  The undersurface of the  supraspinatus was visualized and no obvious tear was seen.  The scope  was then placed in the subacromial space and good visualization was  never achieved.   Arthroscope was removed and a saber-cut incision made over the  anterolateral aspect of the left shoulder.  Skin edges were retracted.  Bleeders were coagulated.  Deltoid fibers released off the anterior  aspect of the acromion only for about 1.5 cm.  A self-retaining  retractor was put in position.  Using a power saw, an anterior  acromioplasty was done per [facility]ER.  Once this was accomplished, access to  the subacromial space was achieved.  The subacromial bursa was excised.  There was a full-thickness nonretracted tear in the supraspinatus it is  distal insertion.  A Mayo scissors could be placed through the tear into  the glenohumeral joint itself.  A series of interrupted sutures were  then used to close the repair and anatomic watertight closure was  achieved.  A copious amount of saline solution was used to irrigate the  shoulder.  The deltoid proper was reattached with 0 Vicryl and 2-0  Vicryl was used to close the  subcutaneous tissue.  Stainless steel  staples used to close the skin.  Sterile dressing applied and the  patient returned to the recovery room in condition.  Technically, this  procedure went extremely well.  I was very pleased with the final  outcome.   DISPOSITION:  1. Discharged to home.  2. Sling.  3. Percocet for pain.  4. See me in the office next week.           ______________________________  Lenard Galloway. Tony Manning, M.D.     RAM/MEDQ  D:  09/16/2006  T:  09/16/2006  Job:  [id number]

## 2011-01-23 NOTE — Discharge Summary (Signed)
NAME:  Tony Manning, Tony Manning                     ACCOUNT NO.:  [id number]   MEDICAL RECORD NO.:  [id number]                   PATIENT TYPE:  INP   LOCATION:  4740                                 FACILITY:  [facility]   PHYSICIAN:  Cristy Hilts. [name], M.D.                  DATE OF BIRTH:  28-Jun-1961   DATE OF ADMISSION:  08/08/2003  DATE OF DISCHARGE:  08/09/2003                                 DISCHARGE SUMMARY   DISCHARGE DIAGNOSES:  1. Chest pain consistent with unstable angina.  2. Coronary disease, left anterior descending and obtuse marginal stenting     July 29, 2003 with catheterization this admission revealing no     restenosis and a 90% small second diagonal which was unchanged.  Plan is     for medical therapy.  3. Treated hypertension.  4. Dyslipidemia on Lipitor.  5. Past history of depression treated with Effexor.   HOSPITAL COURSE:  The patient is a 50 year old male who was seen in the  emergency room June 16, 2003 for chest pain.  He was seen in the office  and had Cardiolite study which was abnormal showing some anterior and  anteroseptal ischemia.  He was set up for diagnostic catheterization which  was done July 30, 2003.  The patient underwent successful PTCA and  stenting of the LAD with a Taxus stent and a balloon angioplasty to the OM1  and cutting balloon therapy to the OM1.  He was discharged.  At discharge he  was given a prescription for Prevacid, but never filled it.  He presented to  the emergency room August 09, 2003 with recurrent chest pain similar to his  pre angioplasty symptoms.  He had taken two nitroglycerin at home without  relief.  He was seen by Cristy Hilts. [name], M.D., admitted to telemetry, and set  up for diagnostic catheterization which was done that same day, August 08, 2003.  Catheterization revealed 30% RCA, normal circumflex, no restenosis of  the previously intervened on OM1 branch, and a widely patent LAD stent.  A  small ostial 90%  second diagonal was unchanged.  EF was 65%.  We started him  on Imdur as well as a PPI.  We feel he can be discharged August 09, 2003.   DISCHARGE MEDICATIONS:  1. Coated aspirin once daily.  2. Plavix 75 mg daily.  3. Coreg 6.25 mg b.i.d.  4. Altace 5 mg h.s.  5. Lipitor 80 mg daily.  6. Effexor XR 150 mg daily.  7. Imdur 30 mg daily.  8. Protonix or Prevacid b.i.d. for two weeks, then once a day.  9. Nitroglycerin sublingual p.r.n.   LABORATORIES:  White count 7.7, hemoglobin 12.7, hematocrit 36.4, platelets  233,000.  Sodium 138, potassium 3.3, BUN 13, creatinine 1.0.  CK-MB and  troponins are negative.  INR 0.9.  Chest x-ray shows no active disease.  EKG  shows sinus rhythm without acute  changes.   DISPOSITION:  The patient is discharged in stable condition and will keep  his previously scheduled appointment with Cristy Hilts. [name], M.D. in the office.      Abelino Derrick, P.A.                      Cristy Hilts. [name], M.D.    Lenard Lance  D:  08/09/2003  T:  08/09/2003  Job:  [id number]

## 2011-01-23 NOTE — Discharge Summary (Signed)
Tony Manning, VANVRANKEN           ACCOUNT NO.:  [id number]   MEDICAL RECORD NO.:  [id number]          PATIENT TYPE:  INP   LOCATION:  3028                         FACILITY:  [facility]   PHYSICIAN:  Kerrin Champagne, M.D.   DATE OF BIRTH:  1961-07-25   DATE OF ADMISSION:  12/29/2007  DATE OF DISCHARGE:  12/31/2007                               DISCHARGE SUMMARY   ADMISSION DIAGNOSES:  1. Herniated nucleus pulposus, central and right side at C6-7 with      spondylosis changes.  2. Foramen entrapment, right C5-6.  3. Mild degenerative disk changes, C4-5.  4. History of kidney stones.  5. Coronary artery disease.  6. Hyperlipidemia.  7. Hypertension.  8. Diabetes mellitus.   DISCHARGE DIAGNOSES:  1. Herniated nucleus pulposus, central and right side at C6-7 with      spondylosis changes.  2. Foramen entrapment, right C5-6.  3. Mild degenerative disk changes, C4-5.  4. History of kidney stones.  5. Coronary artery disease.  6. Hyperlipidemia.  7. Hypertension.  8. Diabetes mellitus.   PROCEDURE:  On December 29, 2007, the patient underwent anterior cervical  diskectomy and fusion, C5-6 and C6-7.  This was performed by Dr. Otelia Sergeant  assisted by Su Hilt, [facility] under general anesthesia.   CONSULTATIONS:  None.   BRIEF HISTORY:  The patient is a 50 year old white male with several  month history of right shoulder pain.  He has had no relief with  conservative treatment.  MRI findings have indicated large disk  herniation at C6-7, central with some extension both to the left and  right, primarily into the right neural foramen affecting the right C7  nerve root.  Spondylosis changes and degenerative disk disease noted at  C5-6 with spurs extending into the right neural foramen and in to the  right C6 nerve root.  It was felt he would require surgical intervention  and was admitted for the procedure as stated above.   BRIEF HOSPITAL COURSE:  The patient tolerated procedure under general  anesthesia.  Neurovascular motor function of the lower extremities  intact.  The patient was able to take liquids without difficulty.  He  did require labs for cardiac enzymes as he complained of some chest  pain.  Values showing CK 271, CK-MB 2.5, and relative index 0.9 with  troponin 0.01.  Other labs showing urinalysis negative for urinary tract  infection.  Chemistry studies on admission, potassium 5.6, glucose 171,  AST 48, total bilirubin 1.7.  Repeat chemistry studies on December 29, 2007, with values all within normal limits with exception of glucose  196.  CBC on admission within normal limits.  Repeat CBC on December 29, 2007, within normal limits.  Coagulation studies on admission within  normal limits with exception of PTT of 22.  The patient was able to use  p.o. analgesics without difficulty.  He was voiding without difficulty,  afebrile, and vital signs stable prior to discharge.   PLAN:  The patient was discharged to his home with instructions to wear  his [place] collar at all times.  He will wear his [place] collar  when showering.  He will be allowed to shower when there is no drainage  from his wound.  He will change his dressing daily.  Low carbohydrate  diet.  The patient was instructed to walk on a daily basis.  No lifting  overhead.  He is allowed no driving.  Follow up with Dr. Otelia Sergeant in 2  weeks from surgery.  All questions encouraged and answered.      Wende Neighbors, P.A.      Kerrin Champagne, M.D.  Electronically Signed    SMV/MEDQ  D:  02/22/2008  T:  02/23/2008  Job:  [id number]

## 2011-01-23 NOTE — Discharge Summary (Signed)
Tony Manning, FRIEDLANDER           ACCOUNT NO.:  [id number]   MEDICAL RECORD NO.:  [id number]          PATIENT TYPE:  INP   LOCATION:  3734                         FACILITY:  [facility]   PHYSICIAN:  Cristy Hilts. [name], MD       DATE OF BIRTH:  1961-01-20   DATE OF ADMISSION:  09/14/2007  DATE OF DISCHARGE:  09/15/2007                               DISCHARGE SUMMARY   The patient was not discharged, he left against medical advice without  signing against medical advice form.   The patient was admitted through the emergency room after he presented  with chest pain. It started at work. He was not doing any physical  activity except walking and had mid sternal and left anterior chest  discomfort. One nitroglycerin helped his discomfort. 8/10 pain decreased  to 3/10, EMS gave him another nitroglycerin and he went to 0 pain level.  No associated symptoms except shortness of breath and diaphoresis but no  nausea. He had done well since his last stent in 2007. He was instructed  he needed to be admitted to [facility] for further evaluation to rule  out MI and for cardiac catheterization the next morning with his known  coronary artery disease and previously he had been asymptomatic.   PAST MEDICAL HISTORY:  Coronary artery disease with stents in 2004. In  2007 he had a stent to the LAD and one to the RCA. He also had  circumflex disease. He has diabetes, hypertension, obstructive sleep  apnea, hyperlipidemia, history of kidney stones, arthritic knees and  multiple surgeries for torn rotator muscle. On initial exam, chest x-ray  probable right infiltrate atelectasis/scar. Followup PA and lateral view  which he was to have the next morning.   LABORATORY DATA:  Hemoglobin 14, hematocrit 40.6, WBC 7.3, platelets  240. Sodium 140, potassium 4.6, BUN 10, creatinine 1.0. Urine culture  was done which was no growth. Glucose was greater than 1000 on his UA.  TSH 1.667. Arterial protein 7, albumin 4, AST  33, ALT 37, alkaline  phosphatase 62, total bilirubin 0.7, magnesium 2. CK-MB marker was less  than 1.0, troponin I less than 0.05 and myoglobin was 57. Followup CK-MB  was 99, MB 1.6, troponin I 0.01. Hemoglobin had been as stated. PT 13.5,  INR of 1, PTT 25. The patient was started on IV heparin per Dr. [name]  and  IV nitroglycerin. I believe still in the emergency room he stated  he wanted to leave before Lendon Colonel, the nurse practitioner on call,  could reach the ER to talk to him. He had pulled his IV out and left  without signing the AMA form. Therefore as we had not worked him up  completely, chest pain is his diagnosis.   We also called him the next morning and gave him an appointment to  followup with Dr. [name] in the office.     Darcella Gasman. [place], N.P.      Cristy Hilts. [name], MD  Electronically Signed   LRI/MEDQ  D:  09/29/2007  T:  09/29/2007  Job:  [id number]   cc:  Reuben Likes, M.D.

## 2011-01-23 NOTE — Discharge Summary (Signed)
NAME:  Tony Manning, Tony Manning                     ACCOUNT NO.:  [id number]   MEDICAL RECORD NO.:  [id number]                   PATIENT TYPE:  OIB   LOCATION:  6525                                 FACILITY:  [facility]   PHYSICIAN:  Cristy Hilts. [name], M.D.                  DATE OF BIRTH:  [birth year]   DATE OF ADMISSION:  07/30/2003  DATE OF DISCHARGE:  07/31/2003                                 DISCHARGE SUMMARY   HISTORY OF PRESENT ILLNESS:  Mr. Elamin is a 50 year old male who was  referred to Dr. [name] in October for evaluation of chest pain with risk  factors for coronary disease. He has hypertension and hyperlipidemia and a  history of smoking. He underwent a Cardiolite study and echocardiogram.  These were abnormal and he was admitted for outpatient catheterization.  Catheterization revealed an 85% mid LAD, 90% OM1, normal LV function. He was  admitted to [facility] on July 30, 2003 for elective PCI. He  underwent Taxis 2 Stent placement to the LAD and a cutting balloon to the  OM. He tolerated this well. Coreg and Plavix were added. We feel that he can  be discharged July 31, 2003.   DISCHARGE MEDICATIONS:  1. Coreg 3.125 mg twice a day.  2. Altace 5 mg a day.  3. Coated aspirin once a day.  4. Plavix 75 mg once a day.  5. Lipitor 80 mg a day.  6. Effexor 150 mg a day.  7. Nitroglycerin sublingual p.r.n.   LABORATORY DATA:  At discharge white count 9.1, hemoglobin 13.7, hematocrit  39.3, platelets 214. Sodium 141, potassium 3.2, BUN 8, creatinine 0.9.   DISPOSITION:  The patient discharged in stable condition.   FOLLOW UP:  With Dr. [name] in the office.      Tony Manning, P.A.                      Cristy Hilts. [name], M.D.    Tony Manning  D:  07/31/2003  T:  07/31/2003  Job:  [id number]   cc:   Reuben Likes, M.D.  [address]  [place]  [place] [address]  Fax: [phone]

## 2011-01-23 NOTE — Op Note (Signed)
NAME:  Tony Manning, Tony Manning                     ACCOUNT NO.:  [id number]   MEDICAL RECORD NO.:  [id number]                   PATIENT TYPE:  INP   LOCATION:  0476                                 FACILITY:  [facility]   PHYSICIAN:  Abigail Miyamoto, M.D.              DATE OF BIRTH:  05-Feb-1961   DATE OF PROCEDURE:  09/06/2003  DATE OF DISCHARGE:                                 OPERATIVE REPORT   PREOPERATIVE DIAGNOSIS:  Acute appendicitis.   POSTOPERATIVE DIAGNOSIS:  Acute appendicitis.   OPERATION/PROCEDURE:  Laparoscopic appendectomy.   SURGEON:  Abigail Miyamoto, M.D.   ANESTHESIA:  General endotracheal anesthesia and 0.25% Marcaine.   ESTIMATED BLOOD LOSS:  Minimal.   INDICATIONS:  Tony Manning is a 50 year old gentleman who presented  with the sudden onset of right lower quadrant abdominal pain several days  ago.  Given his history and exam, it was felt like he potentially had a  kidney stone.  CT scan of the abdomen and pelvis, however, showed a  retrocecal appendicitis.  On physical examination, the gentleman was  actually well in appearance, minimally tender and had a normal white blood  count.   FINDINGS:  The patient was found to have very early, acute, retrocecal  appendicitis.   DESCRIPTION OF PROCEDURE:  The patient was brought to operating room and  identified as Tony Manning.  He was placed supine on the operating  table and general anesthesia was induced.  His abdomen was then prepped and  draped in the usual sterile fashion.  Using a #15 blade, a small transverse  incision was made below the umbilicus.  This incision was carried down  through the fascia which was then opened with a scalpel.  Hemostat was used  to pass through the peritoneal cavity.  Next, a 0 Vicryl pursestring suture  was placed around the fascial opening.  The Hasson port was placed through  the opening and insufflation of the abdomen was begun.  A 5 mm port was then  placed in the  patient's right upper quadrant.  A 12 mm port was placed in  the patient's midline just above the pubis under direct vision.  The cecum  and right colon were then identified.  The base of the appendix was  identified but was found to curve retrocecal and go into the  retroperitoneum.  The cecum and appendix were mobilized by taking down the  white line of Toldt with the Harmonic scalpel.  The base of the appendix was  elevated further and separated from the mesoappendix.  The endoscopic GIA  stapler was then used to transect the base of the appendix.  The rest of the  appendix was dissected out by taking down the mesoappendix underneath the  lateral edge of the colon and freeing up from the rest of the  retroperitoneum.  Once the appendix was totally free of its attachments, we  placed it in an endo-sac.  The patient  did have a mild amount of bleeding in  the retroperitoneum but this appeared to be controlled with the Harmonic  scalpel.  The abdomen was then copiously irrigated with normal saline.  Again the retroperitoneum was examined and hemostasis was felt to be  achieved.  The staple line of the appendiceal stump was also examined and  felt to be hemostatic as well.  All ports were then removed under direct  vision and the abdomen was deflated.  All incisions were then anesthetized  with 0.25% Marcaine and closed with 4-  0 Monocryl subcuticular sutures.  Steri-Strips, gauze and tape were then  applied.  The patient tolerated the procedure well.  All sponge, needle and  instrument counts were correct at the end of the procedure.  The patient was  then extubated in the operating room and taken in stable condition to the  recovery room.                                               Abigail Miyamoto, M.D.    DB/MEDQ  D:  09/06/2003  T:  09/06/2003  Job:  [id number]   cc:   Reuben Likes, M.D.  [address]  [place]  [place] [address]  Fax: [phone]

## 2011-01-23 NOTE — Discharge Summary (Signed)
Tony Manning, Tony Manning           ACCOUNT NO.:  [id number]   MEDICAL RECORD NO.:  [id number]          PATIENT TYPE:  INP   LOCATION:  6524                         FACILITY:  [facility]   PHYSICIAN:  Cristy Hilts. [name], MD       DATE OF BIRTH:  Dec 07, 1960   DATE OF ADMISSION:  11/18/2005  DATE OF DISCHARGE:  11/20/2005                                 DISCHARGE SUMMARY   DISCHARGE DIAGNOSIS:  1.  Coronary artery disease status post intervention during this admission      by Dr. Jenne Campus.  2.  Hypertension.  3.  Hyperlipidemia.  4.  Non-insulin dependent diabetes mellitus followed by Dr. Lorenz Coaster.  5.  Nephrolithiasis.  6.  Recent evaluation for amnesia in February 2007 with negative MRI thought      to be related to years of Ambien.  7.  Depression.  8.  Status post appendectomy in 2005.   PROCEDURES:  Cardiac catheterization performed by Dr. Jenne Campus revealed three  vessel coronary artery disease with right coronary artery 80% stenosis with  Taxus stent placement in the mid to distal right coronary artery with  reduction of lesion from 80% to less than 10%.   HOSPITAL COURSE:  This is a 50 year old Caucasian gentleman with a prior  history of coronary artery disease who was seen initially in 2004 with chest  pain.  He had abnormal Cardiolite and underwent cardiac catheterization with  stenting of the LAD, angioplasty to first obtuse marginal in November 2004.  He developed unstable angina soon after and in December 2004, underwent  another cardiac catheterization which showed 90% of small obtuse marginal  two stenosis which was unchanged from the prior catheterization and medical  therapy was the preferred option for him.  He was doing well until the day  of his presentation in the emergency room, when he was mowing the lawn,  became dizzy, weak, and went inside to take a shower and that is when he  developed severe, substernal chest pressure.  He took two nitroglycerin  pills and then came  to the emergency room  for evaluation.  At the time of  assessment, he was pain free.  We cycled his cardiac enzymes.  He was seen  by Dr. Clarene Duke on admission and the next day underwent cardiac  catheterization.  Cath revealed 80% RCA stenosis.  Dr. Jenne Campus placed Taxus  stent in that lesion with good result.  The patient tolerated the procedure  well.  The next morning, he did not have any complaints of chest pain, no  shortness of breath.  He was walking around the ward without difficulty  since early morning, and his groin site was stable without signs of  ecchymosis or hematoma.   LABORATORY DATA:  Hemoglobin 14.7, hematocrit 42.2, white blood cell count  8.3, platelet count 214.  Sodium 138, potassium 3.5, chloride 102, CO2 28,  BUN 10, creatinine 1, glucose 208, TSH normal.  Enzymes negative x 3.   DISCHARGE INSTRUCTIONS:  No driving, no lifting greater than 10 pounds for  three days post cath.  Discharge diet low fat, low cholesterol, low  salt.   DISCHARGE MEDICATIONS:  1.  Aspirin 325 mg p.o. daily.  2.  Coreg 12.5 mg b.i.d.  3.  Crestor 40 mg daily.  4.  Effexor 225 mg daily.  5.  Plavix 75 mg daily.  6.  Hytrin 10 mg daily.   FOLLOW UP:  Dr. [name] will see the patient on December 01, 2005, at 10:15 a.m.  in the office.      Raymon Mutton, P.A.      Cristy Hilts. [name], MD  Electronically Signed    MK/MEDQ  D:  11/20/2005  T:  11/21/2005  Job:  [id number]

## 2011-01-23 NOTE — H&P (Signed)
NAMEBRAYDAN, Tony Manning           ACCOUNT NO.:  [id number]   MEDICAL RECORD NO.:  [id number]          PATIENT TYPE:  INP   LOCATION:  1829                         FACILITY:  [facility]   PHYSICIAN:  Tony Hilts. [name], Tony Manning       DATE OF BIRTH:  April 23, 1961   DATE OF ADMISSION:  11/18/2005  DATE OF DISCHARGE:                                HISTORY & PHYSICAL   CHIEF COMPLAINT:  Chest pain.   HISTORY OF PRESENT ILLNESS:  The patient is a 50 year old male with a  history of coronary disease.  He was seen initially, in October 2004, with  chest pain.  He had an abnormal Cardiolite study.  Subsequent  catheterization revealed coronary disease and he underwent elective LAD  TAXUS stenting and a PTCA to the OM-1 on July 30, 2003.  He was  restudied, in December 2004, for chest pain and his LAD and OM were okay.  He did have a 90% small OM-2 which was unchanged.  He has done well since.  In 2006, he apparently stopped all his medicine and came in with chest pain.  His medications were resumed.  Cardiolite study was unremarkable for a  significant ischemia in 2006.  Today, he was mowing his lawn and became  dizzy and weak.  He stopped and went inside to take a shower and after that  developed substernal chest pressure which was fairly severe.  His symptoms  improved after nitroglycerin x2.  He is now seen in the emergency room and  is pain free.  He has not had chest pain like this before.  He has had  occasional palpitations.   PAST MEDICAL HISTORY:  1.  Hypertension.  2.  Hyperlipidemia.  3.  Probably non-insulin-dependent diabetes, followed by Dr. Lorenz Manning.  4.  Appendectomy in December 2005.  5.  Kidney stones.  6.  Recent evaluation for memory loss, in February 2007, with a negative      MRI.  It was felt at some point that this may have been secondary to      Ambien.  The patient does have a history of depression and is on      medications for this.   CURRENT MEDICATIONS:  1.  Coreg 12.5  mg b.i.d.  2.  Aspirin 81 mg a day.  3.  Hytrin 2 mg a day.  4.  Crestor 40 mg a day, per Dr. Lorenz Manning.  5.  Effexor 225 mg a day.   He has no known drug allergies but is intolerant to ACE INHIBITORS which  caused a cough.   SOCIAL HISTORY:  He is married.  He has two children.  He is a nonsmoker.  He is a Teacher, adult education at [employer].   FAMILY HISTORY:  Remarkable for coronary disease.  His father had coronary  disease in his 18s.   REVIEW OF SYSTEMS:  Essentially unremarkable, except as noted above.  He  denies any GI bleeding or melena.   PHYSICAL EXAMINATION:  VITAL SIGNS:  Blood pressure 128/80, pulse 88, temp  98.9.  GENERAL:  He is a  well-developed, overweight male in no acute distress.  HEENT:  Normocephalic.  Extraocular movements are intact.  Sclerae is  nonicteric.  Lids and conjunctivae within normal limits.  NECK:  Without bruits.  No JVD.  CHEST:  Clear to auscultation percussion.  CARDIAC:  Reveals a regular rate and rhythm without obvious murmur, rub, or  gallop.  Normal S1 S2.  ABDOMEN:  Obese, nontender.  No hepatosplenomegaly.  EXTREMITIES:  Without edema.  Distal pulses are 2+ over 4.  NEUROLOGIC:  Grossly intact.  He is awake, alert, oriented, and cooperative.  He moves all extremities without obvious deficit.  SKIN:  Warm and dry.   LABORATORY:  Shows normal renal function.  His glucose is 215.  Hemoglobin  14.8.  Troponin negative x1.  EKG showed a sinus rhythm without acute  changes.   IMPRESSION:  1.  Unstable angina rule out progression of coronary disease.  2.  Coronary disease with left anterior descending artery TAXUS stent and      obtuse marginal-1 percutaneous transluminal coronary angioplasty on      July 30, 2003, with patent sites on re-look December 2004, and a      negative Cardiolite in 2006.  3.  Hypertension, treated.  4.  Dyslipidemia, treated by Dr. Lorenz Manning.  5.  ACE inhibitor intolerance with  a cough.  6.  History of depression.  7.  Probable non-insulin-dependent diabetes.  The patient was on glipizide      at one point but this was held by Dr. Lorenz Manning because of his episode of      memory loss.  8.  Obesity.   PLAN:  1.  The patient will be admitted to telemetry, started on heparin and      nitrates and MI ruled out.  2.  We may need to obtain a Cardiolite or another catheterization depending      on his symptoms and his enzymes.  3.  We will also add PPI.      Tony Manning, P.A.      Tony Hilts. [name], Tony Manning  Electronically Signed    LKK/MEDQ  D:  11/18/2005  T:  11/18/2005  Job:  [id number]

## 2011-01-23 NOTE — Op Note (Signed)
Tony Manning, Tony Manning           ACCOUNT NO.:  [id number]   MEDICAL RECORD NO.:  [id number]          PATIENT TYPE:  AMB   LOCATION:  DSC                          FACILITY:  [facility]   PHYSICIAN:  Rodney A. Mortenson, M.D.DATE OF BIRTH:  June 05, 1961   DATE OF PROCEDURE:  12/09/2006  DATE OF DISCHARGE:                               OPERATIVE REPORT   INDICATION:  This 50 year old male had rotator cuff repair in the past.  He has somewhat limited range of motion about the shoulder.  The limits  in motion have caused this pain about the shoulder and also causes pain  about the elbow.  It is felt that most of the pain in the upper  extremity is really related to the limitation of motion about the  shoulder and he is now admitted for manipulation.   JUSTIFICATION OF OUTPATIENT SURGERY:  Minimal morbidity.   PREOPERATIVE DIAGNOSIS:  Adhesive capsulitis, left shoulder.   POSTOPERATIVE DIAGNOSIS:  Adhesive capsulitis, left shoulder.   OPERATION:  Closed manipulation of the left shoulder.   ANESTHESIA:  General.   SURGEON:  Rodney A. [name], M.D.   PROCEDURE:  The patient is placed on the operative table in the supine  position.  After satisfactory general anesthesia, the shoulder was put  through a full range of motion.  Abduction about 75-80 degrees, external  rotation about 70 degrees, internal rotation limited to about 50  degrees.  The scapula is stabilized and then the shoulder is manipulated  in abduction, external and internal rotation.  Scarring could be felt to  break and the shoulder motion was markedly improved.  After manipulation  and the scapula stabilized, the humerus could abduct to about 120  degrees, external rotation 90 degrees, internal rotation 70 to 80  degrees.  There was some popping about the elbow as the shoulder was  manipulated but it has full range of motion and no swelling.  Traction  straps were placed in the arm and postoperatively, he will be in  overhead traction at home in bed.   DISPOSITION:  1. Percocet for pain.  2. Overhead traction at night.  3. Start physical therapy tomorrow.  4. Return to my office next week for follow-up exam.   COMPLICATIONS:  None.          ______________________________  Lenard Galloway. [name], M.D.    RAM/MEDQ  D:  12/09/2006  T:  12/09/2006  Job:  [id number]

## 2011-01-23 NOTE — Op Note (Signed)
Tony Manning           ACCOUNT NO.:  [id number]   MEDICAL RECORD NO.:  [id number]          PATIENT TYPE:  OIB   LOCATION:  5006                         FACILITY:  [facility]   PHYSICIAN:  Khamani Fairley. Magnus Ivan, M.D.DATE OF BIRTH:  08-23-1961   DATE OF PROCEDURE:  10/28/2006  DATE OF DISCHARGE:                               OPERATIVE REPORT   PREOPERATIVE DIAGNOSIS:  Left index finger infection with infected  flexor tenosynovitis.   POSTOPERATIVE DIAGNOSIS:  Left index finger infection with infected  flexor tenosynovitis.   PROCEDURE:  Irrigation and debridement of left index finger including  flexor tendon sheath.   SURGEON:  Doneen Poisson, MD   ANESTHESIA:  General.   ANTIBIOTICS:  1 gram vancomycin.   TOURNIQUET TIME:  19 minutes.   COMPLICATIONS:  None.   BLOOD LOSS:  Minimal.   INDICATIONS:  Briefly Tony Manning is a 50 year old diabetic male with  worsening left index finger pain over the past 24 hours.  He is not sure  if he has injured it any way but he is a known diabetic.  He was seen  another surgeon's office this morning and sent over to my office.  I  found him to have evidence of flexor tenosynovitis with flexion of his  finger be very painful to him.  He had swelling and erythema.  I elected  to pursue irrigation and debridement, given his diabetes as well.  The  risks and benefits of this were explained to him and well understood.  He agreed to proceed with surgery.   PROCEDURE DESCRIPTION:  After informed consent was signed and  appropriate left arm was marked, he was brought to the operating room  placed supine on OR table.  General anesthesia was then obtained.  His  hand was prepped and draped with Betadine scrub and paint as well as  DuraPrep.  Tourniquet was used and the arm was raised.  I  inflated the  tourniquet to 250 mL pressure.  A time-out was called and he was  identified as correct patient, correct extremity.  I then made  incision  first over the dorsum of the PIP joint and encountered abundant edema  fluid.  I copiously irrigated this with bacitracin followed by normal  saline solution.  I then turned his hand over so the palmar side was up.  I made incision between the PIP and DIP joint and carried this down to  the flexor tendon.  I irrigated tissues in this area.  I then opened the  flexor tendon sheath and put a pediatric feeding tube into the flexor  tendon sheath.  I then irrigated this with several ccs of bacitracin  solution.  The second incision was made in the palm near the level of A1  pulley.  A small hole was made in the flexor tendon on this area as well  to allow for further irrigation and I placed a pediatric feeding tube in  through the flexor tendon in this area.  I then copiously irrigated this  as well.  The pediatric feeding tubes were then removed.  I irrigated  the soft tissues  a further time with normal saline.  I then let  tourniquet down at 19 minutes.  Hemostasis was obtained.  Xeroform was  placed over each  wound and packed down in the wound. Well-padded sterile dressings were  applied including a Coban.  The patient was awakened, extubated and  taken to recovery room stable condition.  There no complications.  Final  counts were correct.  Postoperatively I will have him on 24 hours IV  antibiotics and continued observation.           ______________________________  Vanita Panda. Magnus Ivan, M.D.     CYB/MEDQ  D:  10/28/2006  T:  10/29/2006  Job:  [id number]

## 2011-01-23 NOTE — Cardiovascular Report (Signed)
Tony Manning, Tony Manning           ACCOUNT NO.:  [id number]   MEDICAL RECORD NO.:  [id number]          PATIENT TYPE:  INP   LOCATION:  6524                         FACILITY:  [facility]   PHYSICIAN:  Darlin Priestly, MD  DATE OF BIRTH:  [birth year]   DATE OF PROCEDURE:  11/19/2005  DATE OF DISCHARGE:  11/20/2005                              CARDIAC CATHETERIZATION   PROCEDURE:  1.  Left heart catheterization.  2.  Coronary angiography.  3.  Left ventriculogram.  4.  Right coronary artery, distal.  5.  Placement, intercoronary stent.   ATTENDING PHYSICIAN:  Darlin Priestly, MD   COMPLICATIONS:  None.   INDICATIONS:  Tony Manning is a 50 year old male patient of Dr. Yates Decamp with  a history of LAD Taxus stenting in November, 2004 as well as PTCA of the  first obtuse marginal.  He also has a history of hypertension,  hyperlipidemia, and depression.  He was readmitted on November 18, 2005 with  recurrent chest pain and subsequently ruled out for myocardial infarction.  He is now brought for repeat catheterization to reassess his coronary  anatomy.   DESCRIPTION OF PROCEDURE:  After informed consent, the patient was brought  to the cardiac cath.  The right leg and groin were shaved, prepped and  draped in the usual sterile fashion.  ECG monitor established.  Using a  modified Seldinger technique, #6 French arterial sheaths inserted in right  femoral artery.  A 6 French diagnostic catheterization was then used to  perform diagnostic angiography.   The left main is a large vessel with no evidence of disease.   The LAD is a medium sized vessel which courses to the apex and gives off two  diagonal branches.  There is a stent noted in the distal mid portion of the  LAD after take-off of the second diagonal.  There is 50% disease in the mid  LAD prior to the takeoff of the second diagonal.  The LAD stent appears to  be patent with an aneurysmal segment in the LAD at the proximal portion of  the stent.  There is TIMI III flow in the vessel.  First and second  diagonals were small to medium sized vessels with no evidence of disease.   The left circumflex was a medium sized vessel that courses to the A-V groove  and gives rise to three small obtuse marginal  branches.  The A-V groove  circumflex has 40% early mid stenosis after take-off of the first OM.  The  first OM is a medium sized vessel with no evidence of disease.   The second OM is a small vessel with no evidence of disease.   The third OM is a small vessel which bifurcates in its mid segment with a  90% proximal lesion.   The right coronary artery is a medium-large sized vessel which is dominant  and gives rise to the PDA and  posterolateral branch.  There is an 80%  lesion at the distal RCA prior to the bifurcation of the PDA and  posterolateral branch.   The left ventriculogram reveals preserved EF at  60%.   HEMODYNAMICS:  Systemic arterial pressure 132/87, LV systemic pressure  137/3.  LVEDP of 11.   INTERVENTIONAL PROCEDURE:  Following diagnostic angiography, a 6 [place] JR4  guiding catheter with side holes was coaxially engaged in the right coronary  ostium.  Next, a 0.0144 Forte marker wire was advanced inside the guiding  catheter and positioned in the distal PLA without difficulty.  Measurements  are made.  Following this, a Taxus 2.5 x 12 mm stent was then positioned  across the stenotic lesion.  The stent was then put at 8 atmospheres for a  total of 25 seconds.  The second inflation to 10 atmospheres was performed  for a total of 21 seconds.  Follow-up angiogram revealed good luminal gain.  This balloon was then removed, and a Quantum Maverick 2.5 x 8 mm balloon was  then positioned within the distal portion of the stent.  Two inflations to a  maximum of 20 atmospheres was performed for a total of approximately 52  seconds.  Follow-up angiogram revealed no evidence of dissection of the  thrombus with  TIMI III flow to the distal vessel.  IV Integrilin was used  throughout the case.  An intravenous dose of heparin was given to maintain  the ACT between 200-300.   Final orthogonal angiograms  revealed less than 10% residual stenosis in the  distal RCA stenotic lesion with TIMI III flow to the distal vessel.  At this  point, we elected to conclude the procedure.  All balloons, instruments, and  catheters were removed.  Hemostatic sheaths were sewn in place.  The patient  was transferred back to the ward in stable condition.   CONCLUSION:  1.  Successful placement of a Taxus 2.5 x 12 mm stent in the distal right      coronary artery stenotic lesion.  2.  Normal left ventricular systolic function.  3.  Adjunct use of Integrilin infusion.      Darlin Priestly, MD  Electronically Signed     RHM/MEDQ  D:  11/19/2005  T:  11/20/2005  Job:  [id number]   cc:   Cristy Hilts. [name], MD  Fax: [phone]   Reuben Likes, M.D.  Fax: [phone]

## 2011-01-23 NOTE — Cardiovascular Report (Signed)
NAME:  EMMAUS, BRANDI                     ACCOUNT NO.:  [id number]   MEDICAL RECORD NO.:  [id number]                   PATIENT TYPE:  OIB   LOCATION:  2869                                 FACILITY:  [facility]   PHYSICIAN:  Cristy Hilts. [name], M.D.                  DATE OF BIRTH:  10-14-1960   DATE OF PROCEDURE:  07/30/2003  DATE OF DISCHARGE:                              CARDIAC CATHETERIZATION   REFERRING PHYSICIAN:  Reuben Likes, M.D.   PROCEDURE PERFORMED:  1. PTCA and stenting of the mid left anterior descending artery.  2. Cutting balloon angioplasty of the obtuse marginal branch of the     circumflex coronary artery.   INDICATIONS:  Mr. Riedesel is a 50 year old Caucasian male with history of  hyperlipidemia, hypertension who has been having recurrent chest pain  suggestive of angina pectoris and had undergone Cardiolite stress test which  had revealed anteroseptal and anteroapical wall ischemia.  Given this he  underwent diagnostic cardiac catheterization at [facility] on  the same day and was found to have 85%, maybe 90% stenosis of the mid left  anterior descending artery and also about 90% stenosis of the obtuse  marginal 1 branch of the circumflex coronary artery.  Given this he was  brought to [facility] for elective PCI of the LAD and possibly the  obtuse marginal branch.   INTERVENTIONAL DATA:  1. Successful PTCA and stenting of the mid left anterior descending artery     with a 2.75 x 12 mm Taxus stent deployed at 14 atmospheres of pressure     (3.01 mm).  The stenosis reduced from 85% to 0% with TIMI 3 to TIMI 3     flow maintained at the end of the procedure.  There was a slight plaque     shift into the diagonal 2 branch.  However, excellent flow was noted in     the diagonal 2.  2. Successful cutting balloon angioplasty of the obtuse marginal 1 branch of     the circumflex coronary artery with a 2.0 x 6 mm cutting balloon.  The     stenosis  reduced from 90% to 0%.  The TIMI flow was TIMI 3 to TIMI 3 flow     at the end of the procedure.   RECOMMENDATIONS:  1. Aggressive risk factor modification is indicated.  His LDL is 201 and     will try to get this down to close to 70.  2. Weight loss, exercise therapy is indicated.   TECHNIQUE OF PROCEDURE:  Under usual sterile precautions using a 7-French  right femoral arterial access, a 7-French FL4 guide was advanced into the  ascending aorta with a 0.035 [place] inch J-wire.  The catheter was utilized  to engage the left main coronary artery.  Intravenous Angiomax was utilized  for anticoagulation.  A 190 cm x 0.014 inch ATW marker guidewire was  utilized to cross into the left anterior descending artery and the lesion  length was measured.  Because of significant plaque burden a 2.75 x 6 mm  cutting balloon was utilized to perform multiple balloon inflations at 6-7  atmospheres of pressure from 60-80 seconds at the LAD.  After performing  multiple inflations angiography was repeated.  Then, because of the location  of the lesion in the LAD, it was decided to proceed with stenting with a  drug-eluting stent.  A 2.75 x 12 mm Taxus stent was utilized and the stent  was localized due to the lesion and the stent was deployed at 14 atmospheres  of pressure for 60 seconds.  During the pre stent and post stent 200 mcg of  nitroglycerin was also administered.  Excellent results were noted post  angioplasty.  However, there was some plaque shift into the diagonal 2.  However, there was excellent flow noted in the diagonal 2.  There was no  evidence of thrombus, no evidence of dissection at the end of procedure.  Then the guidewire and the stent balloon was returned to the guiding  catheter and the guidewire was manipulated to cross into the obtuse marginal  branch and the tip of the wire was carefully positioned in the distal obtuse  marginal branch.  Then a 2.0 x 6 mm cutting balloon was  utilized to cross  into the obtuse marginal lesion.  With __________ of the balloon there was  no flow in the obtuse marginal vessel indicating severity of the stenosis.  Multiple balloon inflations were performed anywhere from 4-6 atmospheres of  pressure from 30 seconds to 70 seconds.  The balloon was deflated.  Intracoronary nitroglycerin 200 mcg was administered.  Angiography was  repeated.  Excellent results were noted.  After confirming the success of  the procedure the guidewire was withdrawn out of the body and the guide  catheter disengaged and pulled out of the body in the usual fashion.  During  the procedure ACT was maintained in the therapeutic range.  The patient  tolerated the procedure well.  No complications were noted.                                               Cristy Hilts. [name], M.D.    Pilar Plate  D:  07/30/2003  T:  07/30/2003  Job:  [id number]   cc:   Reuben Likes, M.D.  [address]  [place]  [place] [address]  Fax: [phone]   [facility]

## 2011-01-23 NOTE — Discharge Summary (Signed)
NAME:  TREYVONE, CHELF                     ACCOUNT NO.:  [id number]   MEDICAL RECORD NO.:  [id number]                   PATIENT TYPE:  INP   LOCATION:  0476                                 FACILITY:  [facility]   PHYSICIAN:  Abigail Miyamoto, M.D.              DATE OF BIRTH:  11-Mar-1961   DATE OF ADMISSION:  09/06/2003  DATE OF DISCHARGE:  09/09/2003                                 DISCHARGE SUMMARY   DISCHARGE DIAGNOSES:  1. Acute appendicitis, status post appendectomy.  2. Postoperative hemorrhage.  3. Hypertension.  4. Coronary artery disease.   HISTORY:  Mr. Diyan Dave is a gentleman who presented with a 24 hour  history of sharp right upper quadrant abdominal pain.  He had a CAT scan  performed which showed him to have findings consistent with appendicitis.  Therefore given these findings, decision was made to take him to the  operating room for an appendectomy.   HOSPITAL COURSE:  The patient was admitted, taken to the operating room  where he underwent laparoscopic appendectomy.  Postoperatively, several  hours after surgery he developed some chest pain and burning across his  ribs.  He was found to be slightly hypotensive with a blood pressure of  80/60.  EKG and isoenzymes were performed.  These were unremarkable with no  evidence of myocardial event.  His hemoglobin, however, had decreased from  14.7 to 11.5.  At this point, we discussed proceeding to the operating room  for re-exploration versus transfusion.  At this point after discussion with  him and his family he opted to proceed with transfusing blood.  His blood  pressure, however, quickly responded to IV fluids alone.  On postoperative  day #1, his systolic blood pressure had increased to the 120s systolic.  He  was feeling much better.  His abdomen was soft and he was improving.  His  hemoglobin, however, had decreased to 8.7.  It was felt that his bleeding  had stopped, and at this point he had responded  appropriately.  He was  continued to be monitored.  By postoperative day #2, he was ambulating in  the halls.  He felt better.  His hemoglobin appeared to be stable.  He was  developing some ecchymoses around his incision.  By postoperative day #2, he  remained afebrile, his vital signs were stable, he was eating well and  moving his bowels.  At this point, his bleeding apparently stopped,  hemoglobin was stable, and decision was made to discharge the patient home.   DISCHARGE MEDICATIONS:  1. He will take Percocet and Advil for pain.  2. He will take Augmentin 875 mg b.i.d.  3. He will resume his home medications.   ACTIVITY:  No heavy lifting greater than 20 pounds for three weeks.  He may  shower.   FOLLOWUP:  He will follow up in my office in the next few days for a re-  check.  Abigail Miyamoto, M.D.   DB/MEDQ  D:  09/25/2003  T:  09/26/2003  Job:  [id number]

## 2011-01-23 NOTE — H&P (Signed)
NAME:  Tony Manning, Tony Manning                     ACCOUNT NO.:  [id number]   MEDICAL RECORD NO.:  [id number]                   PATIENT TYPE:  EMS   LOCATION:  ED                                   FACILITY:  [facility]   PHYSICIAN:  Abigail Miyamoto, M.D.              DATE OF BIRTH:  03/01/1961   DATE OF ADMISSION:  09/06/2003  DATE OF DISCHARGE:                                HISTORY & PHYSICAL   CHIEF COMPLAINT:  Abdominal pain.   HISTORY:  This is a 50 year old gentleman who developed the sudden onset of  sharp right lower quadrant approximately two days ago. He presented  yesterday to this primary care physician, Dr. Leslee Home, and was felt to  have kidney stones at that time. A CAT scan was ordered for today, however  the CAT scan has shown findings of acute appendicitis. Therefore, he has  been sent to the emergency room for further evaluation by general surgery.  The patient reports he had just minimal right lower quadrant discomfort. He  has had no nausea or vomiting. He has recently eaten and otherwise feels  well. He denies chest pain, fever, shortness of breath, dysuria, problems  moving his bowels, etc. His review of systems is otherwise unremarkable.   PAST MEDICAL HISTORY:  Significant for coronary artery disease and he has  had stents placed. He has also had a lung biopsy. His previous surgical  history includes surgery on both knees and extremities as well as stent  placement. He has no known drug allergies. His medications include Plavix,  Effexor, Altace, aspirin, Lipitor, Prevacid, Coreg, and isosorbide.   SOCIAL HISTORY:  He does not smoke or drink alcohol.   PHYSICAL EXAMINATION:  GENERAL: Well-developed, well-nourished gentleman in  no acute distress.  VITAL SIGNS: Temperature 98.2, pulse 88, respiratory rate 18, blood pressure  131/78.  GENERAL: He is in appearance.  HEENT: He is anicteric. Pupils are reactive bilaterally. External ears and  nose are normal.  Hearing is normal. Oropharynx is clear.  NECK: Supple. There is no cervical adenopathy. There is no thyromegaly.  LUNGS: Clear to auscultation bilaterally with normal respiratory effort.  CARDIOVASCULAR: Regular rate and rhythm without murmurs. There is no  peripheral edema. There is minimal tenderness in the right lower quadrant.  There is no guarding. There are masses or hernias.  EXTREMITIES: Warm and well perfused.   X-RAY DATA:  The patient had a CAT scan of the abdomen and pelvis which  shows him to have a distended and inflamed appearing appendix which appears  retrocecal. There is some periappendiceal inflammation. There is no evidence  of stones in the ureter, although there is a right kidney stone in the renal  pelvis.   IMPRESSION AND PLAN:  This is a 50 year old gentleman with probable acute  appendicitis based on CAT scan findings. At this point plan will be proceed  to the operating room for appendectomy. We will try the laparoscopic  approach, although it is high risk of being open procedure given the  retrocecal location of the appendix. Discussed the risks of the surgery  including bleeding, infection as well as finding a normal appendix. At this  point he wishes to proceed with surgery and this will be scheduled  emergently.                                               Abigail Miyamoto, M.D.    DB/MEDQ  D:  09/06/2003  T:  09/06/2003  Job:  [id number]

## 2011-02-19 ENCOUNTER — Ambulatory Visit
Admission: RE | Admit: 2011-02-19 | Discharge: 2011-02-19 | Disposition: A | Discharge status: Home / Self Care | Payer: PRIVATE HEALTH INSURANCE | Source: Ambulatory Visit | Attending: Neurosurgery | Admitting: Neurosurgery

## 2011-02-19 ENCOUNTER — Other Ambulatory Visit: Payer: Self-pay | Admitting: Neurosurgery

## 2011-02-19 DIAGNOSIS — IMO0002 Reserved for concepts with insufficient information to code with codable children: Secondary | ICD-10-CM

## 2011-02-24 ENCOUNTER — Other Ambulatory Visit: Payer: PRIVATE HEALTH INSURANCE

## 2011-02-26 ENCOUNTER — Encounter: Payer: Self-pay | Admitting: Family Medicine

## 2011-03-02 ENCOUNTER — Other Ambulatory Visit (INDEPENDENT_AMBULATORY_CARE_PROVIDER_SITE_OTHER): Payer: PRIVATE HEALTH INSURANCE | Admitting: Family Medicine

## 2011-03-02 DIAGNOSIS — E1149 Type 2 diabetes mellitus with other diabetic neurological complication: Secondary | ICD-10-CM

## 2011-03-02 LAB — HEMOGLOBIN A1C: Hgb A1c MFr Bld: 9.5 % — ABNORMAL HIGH (ref 4.6–6.5)

## 2011-03-10 ENCOUNTER — Encounter: Payer: Self-pay | Admitting: Family Medicine

## 2011-03-10 ENCOUNTER — Ambulatory Visit (INDEPENDENT_AMBULATORY_CARE_PROVIDER_SITE_OTHER): Payer: PRIVATE HEALTH INSURANCE | Admitting: Family Medicine

## 2011-03-10 VITALS — BP 120/80 | HR 76 | Temp 98.3°F | Ht 71.0 in | Wt 215.8 lb

## 2011-03-10 DIAGNOSIS — E119 Type 2 diabetes mellitus without complications: Secondary | ICD-10-CM

## 2011-03-10 DIAGNOSIS — M542 Cervicalgia: Secondary | ICD-10-CM

## 2011-03-10 DIAGNOSIS — E1149 Type 2 diabetes mellitus with other diabetic neurological complication: Secondary | ICD-10-CM

## 2011-03-10 NOTE — Patient Instructions (Addendum)
Start the lantus today and come back for an A1c in 3 months with a visit after that.  Take care.  Keep taking the metformin and the glimepiride for now.

## 2011-03-10 NOTE — Progress Notes (Signed)
"  If I don't do anything, I'm all right."  R knee surgery 4/12 and R shoulder surgery 2 weeks ago.  He tried to work on some countertops at home but then had days of pain afterward.  He has f/u with ortho.   Diabetes:  Using medications without difficulties:yes Hypoglycemic episodes:no Hyperglycemic episodes:yes up to ~250, A1c elevated and las d/w pt today.   Feet problems: some better with the amitriptyline but he is drowsy sometimes after taking it so he hadn't taken it every night.  Blood Sugars averaging: 160-170 in AM Diet: working on low carb diet.  He is skipping meals.  We talked about this.   PMH and SH reviewed  Meds, vitals, and allergies reviewed.   ROS: See HPI.  Otherwise negative.    GEN: nad, alert and oriented HEENT: mucous membranes moist NECK: supple w/o LA CV: rrr. PULM: ctab, no inc wob ABD: soft, +bs EXT: no edema SKIN: no acute rash

## 2011-03-10 NOTE — Assessment & Plan Note (Addendum)
Start lantus.  >25 min spent with face to face with patient, >50% counseling.  Work on consistent meals, snacks and gradually inc the lantus dose per instructions.  He agrees.  Will recheck A1c in 3 months.  I don't think he can get control without work on meals or by adding other oral agents.  He has used pen injections before and is comfortable with the usage.

## 2011-03-11 ENCOUNTER — Encounter: Payer: Self-pay | Admitting: Family Medicine

## 2011-03-26 ENCOUNTER — Other Ambulatory Visit: Payer: Self-pay | Admitting: Neurosurgery

## 2011-03-26 DIAGNOSIS — M542 Cervicalgia: Secondary | ICD-10-CM

## 2011-03-31 ENCOUNTER — Ambulatory Visit
Admission: RE | Admit: 2011-03-31 | Discharge: 2011-03-31 | Disposition: A | Discharge status: Home / Self Care | Payer: PRIVATE HEALTH INSURANCE | Source: Ambulatory Visit | Attending: Neurosurgery | Admitting: Neurosurgery

## 2011-03-31 DIAGNOSIS — M542 Cervicalgia: Secondary | ICD-10-CM

## 2011-04-27 ENCOUNTER — Encounter: Payer: Self-pay | Admitting: Family Medicine

## 2011-04-27 ENCOUNTER — Ambulatory Visit (INDEPENDENT_AMBULATORY_CARE_PROVIDER_SITE_OTHER): Payer: PRIVATE HEALTH INSURANCE | Admitting: Family Medicine

## 2011-04-27 VITALS — BP 122/80 | HR 72 | Temp 98.2°F | Wt 216.0 lb

## 2011-04-27 DIAGNOSIS — L989 Disorder of the skin and subcutaneous tissue, unspecified: Secondary | ICD-10-CM | POA: Insufficient documentation

## 2011-04-27 DIAGNOSIS — E1149 Type 2 diabetes mellitus with other diabetic neurological complication: Secondary | ICD-10-CM

## 2011-04-27 NOTE — Progress Notes (Signed)
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  Now with poorly healing spot on L side.  Present for years. Occ will bleed.  No FCNAV. It will get sore and red, then partially heal. He has CPAP mask, but this doesn't appear to be related.    Has titrated up his lantus to 33 units a day with AM sugars ~100.   Meds, vitals, and allergies reviewed.   ROS: See HPI.  Otherwise, noncontributory.  ncat  Tm wnl Nasal and oral exam wnl 5mm irritated lesion on L side of nose with small, ~34mm central irritation.  The edge appears to have an elevated border

## 2011-04-27 NOTE — Patient Instructions (Signed)
See Shirlee Limerick about your referral before your leave today. Take care.

## 2011-04-28 NOTE — Assessment & Plan Note (Signed)
I would prefer derm eval for consideration of excision, esp given his history.  He agrees.

## 2011-04-28 NOTE — Assessment & Plan Note (Signed)
Return for A1c.

## 2011-05-27 LAB — URINALYSIS, ROUTINE W REFLEX MICROSCOPIC
Bilirubin Urine: NEGATIVE
Glucose, UA: >1000 — AB
Hgb urine dipstick: NEGATIVE
Ketones, ur: NEGATIVE
Leukocytes, UA: NEGATIVE
Nitrite: NEGATIVE
Protein, ur: NEGATIVE
Specific Gravity, Urine: 1.028
Urobilinogen, UA: 0.2
pH: 5

## 2011-05-27 LAB — DIFFERENTIAL
Basophils Absolute: 0
Basophils Relative: 0
Eosinophils Absolute: 0.4
Eosinophils Relative: 5
Lymphocytes Relative: 20
Lymphs Abs: 1.5
Monocytes Absolute: 0.6
Monocytes Relative: 9
Neutro Abs: 4.8
Neutrophils Relative %: 66

## 2011-05-27 LAB — CK TOTAL AND CKMB (NOT AT ARMC)
CK, MB: 1.6
Relative Index: INVALID
Total CK: 99

## 2011-05-27 LAB — I-STAT 8, (EC8 V) (CONVERTED LAB)
Acid-Base Excess: 1
BUN: 12
Bicarbonate: 28 — ABNORMAL HIGH
Chloride: 108
Glucose, Bld: 222 — ABNORMAL HIGH
HCT: 44
Hemoglobin: 15
Operator id: 146091
Potassium: 4.6
Sodium: 140
TCO2: 30
pCO2, Ven: 54.6 — ABNORMAL HIGH
pH, Ven: 7.318 — ABNORMAL HIGH

## 2011-05-27 LAB — MAGNESIUM: Magnesium: 2

## 2011-05-27 LAB — COMPREHENSIVE METABOLIC PANEL
ALT: 37
AST: 33
Albumin: 4
Alkaline Phosphatase: 62
BUN: 10
CO2: 26
Calcium: 9.6
Chloride: 108
Creatinine, Ser: 0.89
GFR calc Af Amer: >60
GFR calc non Af Amer: >60
Glucose, Bld: 110 — ABNORMAL HIGH
Potassium: 3.9
Sodium: 142
Total Bilirubin: 0.5
Total Protein: 7

## 2011-05-27 LAB — URINE CULTURE
Colony Count: NO GROWTH
Culture: NO GROWTH

## 2011-05-27 LAB — PROTIME-INR
INR: 1
Prothrombin Time: 13.5

## 2011-05-27 LAB — POCT CARDIAC MARKERS
CKMB, poc: <1 — ABNORMAL LOW
Myoglobin, poc: 57.6
Operator id: 198171
Troponin i, poc: <0.05

## 2011-05-27 LAB — CBC
HCT: 40.6
Hemoglobin: 14
MCHC: 34.6
MCV: 89.3
Platelets: 240
RBC: 4.54
RDW: 13.9
WBC: 7.3

## 2011-05-27 LAB — TROPONIN I: Troponin I: 0.01

## 2011-05-27 LAB — URINE MICROSCOPIC-ADD ON

## 2011-05-27 LAB — POCT I-STAT CREATININE
Creatinine, Ser: 1
Operator id: 146091

## 2011-05-27 LAB — TSH: TSH: 1.667

## 2011-05-27 LAB — B-NATRIURETIC PEPTIDE (CONVERTED LAB): Pro B Natriuretic peptide (BNP): <30

## 2011-05-27 LAB — APTT: aPTT: 25

## 2011-06-02 LAB — CBC
HCT: 43.3
HCT: 46.6
Hemoglobin: 14.8
Hemoglobin: 16.7
MCHC: 34.1
MCHC: 35.9
MCV: 88.2
MCV: 88.6
Platelets: 214
Platelets: UNDETERMINED
RBC: 4.89
RBC: 5.28
RDW: 13.4
RDW: 13.6
WBC: 7.9
WBC: 9.5

## 2011-06-02 LAB — BASIC METABOLIC PANEL
BUN: 14
CO2: 27
Calcium: 9.8
Chloride: 108
Creatinine, Ser: 0.94
GFR calc Af Amer: >60
GFR calc non Af Amer: >60
Glucose, Bld: 196 — ABNORMAL HIGH
Potassium: 4.8
Sodium: 142

## 2011-06-02 LAB — URINALYSIS, ROUTINE W REFLEX MICROSCOPIC
Bilirubin Urine: NEGATIVE
Glucose, UA: NEGATIVE
Hgb urine dipstick: NEGATIVE
Ketones, ur: NEGATIVE
Nitrite: NEGATIVE
Protein, ur: NEGATIVE
Specific Gravity, Urine: 1.025
Urobilinogen, UA: 0.2
pH: 5

## 2011-06-02 LAB — DIFFERENTIAL
Basophils Absolute: 0
Basophils Relative: 0
Eosinophils Absolute: 0.3
Eosinophils Relative: 3
Lymphocytes Relative: 26
Lymphs Abs: 2.5
Monocytes Absolute: 0.6
Monocytes Relative: 6
Neutro Abs: 6.1
Neutrophils Relative %: 65

## 2011-06-02 LAB — COMPREHENSIVE METABOLIC PANEL
ALT: 45
AST: 48 — ABNORMAL HIGH
Albumin: 4.3
Alkaline Phosphatase: 77
BUN: 13
CO2: 24
Calcium: 9.9
Chloride: 106
Creatinine, Ser: 0.91
GFR calc Af Amer: >60
GFR calc non Af Amer: >60
Glucose, Bld: 171 — ABNORMAL HIGH
Potassium: 5.6 — ABNORMAL HIGH
Sodium: 136
Total Bilirubin: 1.7 — ABNORMAL HIGH
Total Protein: 7.2

## 2011-06-02 LAB — PROTIME-INR
INR: 0.9
Prothrombin Time: 12.8

## 2011-06-02 LAB — CARDIAC PANEL(CRET KIN+CKTOT+MB+TROPI)
CK, MB: 2.5
Relative Index: 0.9
Total CK: 271 — ABNORMAL HIGH
Troponin I: 0.01

## 2011-06-02 LAB — APTT: aPTT: 22 — ABNORMAL LOW

## 2011-06-10 ENCOUNTER — Other Ambulatory Visit (INDEPENDENT_AMBULATORY_CARE_PROVIDER_SITE_OTHER): Payer: PRIVATE HEALTH INSURANCE

## 2011-06-10 DIAGNOSIS — E119 Type 2 diabetes mellitus without complications: Secondary | ICD-10-CM

## 2011-06-11 LAB — HEMOGLOBIN A1C: Hgb A1c MFr Bld: 7.3 % — ABNORMAL HIGH (ref 4.6–6.5)

## 2011-06-16 ENCOUNTER — Ambulatory Visit (INDEPENDENT_AMBULATORY_CARE_PROVIDER_SITE_OTHER): Payer: PRIVATE HEALTH INSURANCE | Admitting: Family Medicine

## 2011-06-16 ENCOUNTER — Encounter: Payer: Self-pay | Admitting: Family Medicine

## 2011-06-16 VITALS — BP 104/70 | HR 77 | Temp 97.9°F | Wt 220.0 lb

## 2011-06-16 DIAGNOSIS — E1149 Type 2 diabetes mellitus with other diabetic neurological complication: Secondary | ICD-10-CM

## 2011-06-16 DIAGNOSIS — E119 Type 2 diabetes mellitus without complications: Secondary | ICD-10-CM

## 2011-06-16 NOTE — Progress Notes (Signed)
"  Some days I feel fairly decent, but some days are worse than others."  Max activity for ~4 hours on a good day, then he'll be in a lot of pain for a few days.  "I'm trying to get back where I can do a little something."    Diabetes:  Using medications without difficulties: yes, now with 66 units of lantus Hypoglycemic episodes:occ Hyperglycemic episodes:no Feet problems: still with pain standing Blood Sugars averaging:~100-150, occ higher recently Most recent A1c 7.3.  D/w pt.  Has been working on diet.   Hands and feet get episodically sweaty, occ leg cramps.  Can happen in late afternoon, night.  Noted last week or two. Not everyday.   Ortho- plan for neck surgery around xmas this year with Dr. Dutch Quint.  Sees Dr Yetta Barre in [facility] and Dr. Dutch Quint at [place].    PMH and SH reviewed  Meds, vitals, and allergies reviewed.   ROS: See HPI.  Otherwise negative.    GEN: nad, alert and oriented HEENT: mucous membranes moist NECK: supple w/o LA CV: rrr. PULM: ctab, no inc wob ABD: soft, +bs EXT: no edema SKIN: no acute rash  Diabetic foot exam: Normal inspection No skin breakdown 1+ DP pulses Dec sensation to light touch and monofilament

## 2011-06-16 NOTE — Patient Instructions (Addendum)
Keep working on your insulin dose. If you get another sweaty episode, then take a snack. Let me know if you have improvement with a snack or persistent low sugars.   I would recheck your A1c in 3 months with your other labs.  OV a few days after labs.  Take care.

## 2011-06-17 LAB — BASIC METABOLIC PANEL
BUN: 11
CO2: 31
Calcium: 10
Chloride: 102
Creatinine, Ser: 1.12
GFR calc Af Amer: >60
GFR calc non Af Amer: >60
Glucose, Bld: 173 — ABNORMAL HIGH
Potassium: 4.5
Sodium: 139

## 2011-06-17 LAB — CBC
HCT: 44.5
Hemoglobin: 15.1
MCHC: 34
MCV: 88.2
Platelets: 228
RBC: 5.04
RDW: 13.3
WBC: 9

## 2011-06-18 ENCOUNTER — Encounter: Payer: Self-pay | Admitting: Family Medicine

## 2011-06-18 NOTE — Assessment & Plan Note (Addendum)
A1c improved.  D/w pt about diet and exercise, weight.  No change in meds, but can titrate lantus based on sugars.  He agrees. He'll monitor the sweats and cramps (I don't think these are related to low sugar, but he'll monitor).  I await update from surgery in the meantime, recheck A1c as planned. He agrees. >25 min spent with face to face with patient, >50% counseling.

## 2011-06-24 LAB — CARDIAC PANEL(CRET KIN+CKTOT+MB+TROPI)
CK, MB: 0.9
CK, MB: 1
Relative Index: INVALID
Relative Index: INVALID
Total CK: 47
Total CK: 54
Troponin I: 0.01
Troponin I: 0.02

## 2011-06-24 LAB — BASIC METABOLIC PANEL
BUN: 12
CO2: 29
Calcium: 9.7
Chloride: 104
Creatinine, Ser: 0.93
GFR calc Af Amer: >60
GFR calc non Af Amer: >60
Glucose, Bld: 200 — ABNORMAL HIGH
Potassium: 4.6
Sodium: 137

## 2011-06-24 LAB — COMPREHENSIVE METABOLIC PANEL
ALT: 64 — ABNORMAL HIGH
AST: 42 — ABNORMAL HIGH
Albumin: 4
Alkaline Phosphatase: 79
BUN: 10
CO2: 28
Calcium: 9.9
Chloride: 101
Creatinine, Ser: 1.06
GFR calc Af Amer: >60
GFR calc non Af Amer: >60
Glucose, Bld: 239 — ABNORMAL HIGH
Potassium: 4.3
Sodium: 135
Total Bilirubin: 0.4
Total Protein: 7.2

## 2011-06-24 LAB — CBC
HCT: 44
HCT: 46.2
Hemoglobin: 14.8
Hemoglobin: 15.6
MCHC: 33.7
MCHC: 33.9
MCV: 88.1
MCV: 88.6
Platelets: 219
Platelets: 231
RBC: 4.99
RBC: 5.21
RDW: 13.9
RDW: 13.9
WBC: 9.7
WBC: 9.7

## 2011-06-24 LAB — POCT CARDIAC MARKERS
CKMB, poc: <1 — ABNORMAL LOW
CKMB, poc: <1 — ABNORMAL LOW
CKMB, poc: <1 — ABNORMAL LOW
Myoglobin, poc: 44.6
Myoglobin, poc: 51.3
Myoglobin, poc: 54.2
Operator id: 277751
Operator id: 277751
Operator id: 277751
Troponin i, poc: <0.05
Troponin i, poc: <0.05
Troponin i, poc: <0.05

## 2011-06-24 LAB — I-STAT 8, (EC8 V) (CONVERTED LAB)
Acid-Base Excess: 4 — ABNORMAL HIGH
BUN: 12
Bicarbonate: 30 — ABNORMAL HIGH
Chloride: 105
Glucose, Bld: 228 — ABNORMAL HIGH
HCT: 49
Hemoglobin: 16.7
Operator id: 277751
Potassium: 4.7
Sodium: 138
TCO2: 31
pCO2, Ven: 49.8
pH, Ven: 7.387 — ABNORMAL HIGH

## 2011-06-24 LAB — DIFFERENTIAL
Basophils Absolute: 0.1
Basophils Relative: 1
Eosinophils Absolute: 0.2
Eosinophils Relative: 3
Lymphocytes Relative: 24
Lymphs Abs: 2.3
Monocytes Absolute: 0.7
Monocytes Relative: 7
Neutro Abs: 6.4
Neutrophils Relative %: 66

## 2011-06-24 LAB — MAGNESIUM: Magnesium: 2

## 2011-06-24 LAB — POCT I-STAT CREATININE
Creatinine, Ser: 1.2
Operator id: 277751

## 2011-06-24 LAB — CK TOTAL AND CKMB (NOT AT ARMC)
CK, MB: 1
Relative Index: INVALID
Total CK: 62

## 2011-06-24 LAB — PROTIME-INR
INR: 1
Prothrombin Time: 13.3

## 2011-06-24 LAB — APTT
aPTT: 25
aPTT: 27

## 2011-06-25 LAB — URINALYSIS, ROUTINE W REFLEX MICROSCOPIC
Bilirubin Urine: NEGATIVE
Glucose, UA: 500 — AB
Hgb urine dipstick: NEGATIVE
Ketones, ur: NEGATIVE
Nitrite: NEGATIVE
Protein, ur: NEGATIVE
Specific Gravity, Urine: 1.035 — ABNORMAL HIGH
Urobilinogen, UA: 0.2
pH: 5.5

## 2011-06-25 LAB — COMPREHENSIVE METABOLIC PANEL
ALT: 54 — ABNORMAL HIGH
AST: 41 — ABNORMAL HIGH
Albumin: 4.5
Alkaline Phosphatase: 78
BUN: 13
CO2: 28
Calcium: 9.8
Chloride: 105
Creatinine, Ser: 0.93
GFR calc Af Amer: >60
GFR calc non Af Amer: >60
Glucose, Bld: 165 — ABNORMAL HIGH
Potassium: 4.7
Sodium: 139
Total Bilirubin: 1.1
Total Protein: 7.9

## 2011-06-25 LAB — CBC
HCT: 42.1
HCT: 42.9
HCT: 46
Hemoglobin: 14.4
Hemoglobin: 14.7
Hemoglobin: 15.7
MCHC: 34.1
MCHC: 34.2
MCHC: 34.2
MCV: 86.8
MCV: 87.1
MCV: 87.3
Platelets: 202
Platelets: 206
Platelets: 229
RBC: 4.83
RBC: 4.94
RBC: 5.27
RDW: 13.3
RDW: 13.5
RDW: 14
WBC: 7.7
WBC: 7.9
WBC: 8.5

## 2011-06-25 LAB — I-STAT 8, (EC8 V) (CONVERTED LAB)
Acid-base deficit: 3 — ABNORMAL HIGH
BUN: 15
Bicarbonate: 22
Chloride: 108
Glucose, Bld: 197 — ABNORMAL HIGH
HCT: 49
Hemoglobin: 16.7
Operator id: 234501
Potassium: 3.8
Sodium: 138
TCO2: 23
pCO2, Ven: 37.6 — ABNORMAL LOW
pH, Ven: 7.375 — ABNORMAL HIGH

## 2011-06-25 LAB — HEPARIN LEVEL (UNFRACTIONATED)
Heparin Unfractionated: 0.24 — ABNORMAL LOW
Heparin Unfractionated: 0.56
Heparin Unfractionated: <0.1 — ABNORMAL LOW

## 2011-06-25 LAB — CK TOTAL AND CKMB (NOT AT ARMC)
CK, MB: 1.7
CK, MB: 2.2
Relative Index: 1.4
Relative Index: 1.4
Total CK: 120
Total CK: 158

## 2011-06-25 LAB — POCT CARDIAC MARKERS
CKMB, poc: 1.2
Myoglobin, poc: 73.6
Operator id: 234501
Troponin i, poc: <0.05

## 2011-06-25 LAB — LIPID PANEL
Cholesterol: 187
HDL: 32 — ABNORMAL LOW
LDL Cholesterol: 123 — ABNORMAL HIGH
Total CHOL/HDL Ratio: 5.8
Triglycerides: 162 — ABNORMAL HIGH
VLDL: 32

## 2011-06-25 LAB — DIFFERENTIAL
Basophils Absolute: 0
Basophils Relative: 1
Eosinophils Absolute: 0.2
Eosinophils Relative: 2
Lymphocytes Relative: 21
Lymphs Abs: 1.8
Monocytes Absolute: 0.6
Monocytes Relative: 7
Neutro Abs: 5.9
Neutrophils Relative %: 69

## 2011-06-25 LAB — CARDIAC PANEL(CRET KIN+CKTOT+MB+TROPI)
CK, MB: 1.8
Relative Index: 1.4
Total CK: 133
Troponin I: 0.02

## 2011-06-25 LAB — TSH: TSH: 0.933

## 2011-06-25 LAB — BASIC METABOLIC PANEL
BUN: 12
CO2: 29
Calcium: 9.3
Chloride: 109
Creatinine, Ser: 0.92
GFR calc Af Amer: >60
GFR calc non Af Amer: >60
Glucose, Bld: 281 — ABNORMAL HIGH
Potassium: 4.1
Sodium: 140

## 2011-06-25 LAB — APTT: aPTT: 27

## 2011-06-25 LAB — TROPONIN I: Troponin I: 0.01

## 2011-06-25 LAB — POCT I-STAT CREATININE
Creatinine, Ser: 0.8
Operator id: 234501

## 2011-06-25 LAB — PROTIME-INR
INR: 0.9
Prothrombin Time: 12.7

## 2011-08-04 ENCOUNTER — Encounter (HOSPITAL_COMMUNITY): Payer: Self-pay | Admitting: Pharmacy Technician

## 2011-08-05 ENCOUNTER — Other Ambulatory Visit: Payer: Self-pay | Admitting: *Deleted

## 2011-08-05 MED ORDER — INSULIN GLARGINE 100 UNIT/ML ~~LOC~~ SOLN: 34.0000 [IU] | Freq: Every day | SUBCUTANEOUS | Status: DC

## 2011-08-06 ENCOUNTER — Other Ambulatory Visit (HOSPITAL_COMMUNITY): Payer: PRIVATE HEALTH INSURANCE

## 2011-08-10 ENCOUNTER — Telehealth: Payer: Self-pay | Admitting: Internal Medicine

## 2011-08-10 NOTE — Telephone Encounter (Signed)
Tony Manning called from disability and would like to know if patient has been put on any restrictions, I looked in his chart and didn't see any.  Please advise.  Tony Manning [phone]

## 2011-08-11 ENCOUNTER — Encounter (HOSPITAL_COMMUNITY): Admission: RE | Payer: Self-pay | Source: Ambulatory Visit

## 2011-08-11 ENCOUNTER — Inpatient Hospital Stay (HOSPITAL_COMMUNITY): Admission: RE | Admit: 2011-08-11 | Payer: PRIVATE HEALTH INSURANCE | Source: Ambulatory Visit | Admitting: Neurosurgery

## 2011-08-11 SURGERY — ANTERIOR CERVICAL DECOMPRESSION/DISCECTOMY FUSION 1 LEVEL
Anesthesia: General

## 2011-08-11 NOTE — Telephone Encounter (Signed)
I believe that would come through Dr Yetta Barre in [facility] and Dr. Dutch Quint in [place].

## 2011-08-12 NOTE — Telephone Encounter (Signed)
LMOVM of Stephanie at # listed below.

## 2011-09-09 ENCOUNTER — Other Ambulatory Visit: Payer: PRIVATE HEALTH INSURANCE

## 2011-09-16 ENCOUNTER — Ambulatory Visit: Payer: PRIVATE HEALTH INSURANCE | Admitting: Family Medicine

## 2011-09-28 ENCOUNTER — Encounter: Payer: Self-pay | Admitting: Family Medicine

## 2011-09-28 ENCOUNTER — Ambulatory Visit (INDEPENDENT_AMBULATORY_CARE_PROVIDER_SITE_OTHER): Payer: Self-pay | Admitting: Family Medicine

## 2011-09-28 DIAGNOSIS — E1149 Type 2 diabetes mellitus with other diabetic neurological complication: Secondary | ICD-10-CM

## 2011-09-28 NOTE — Patient Instructions (Signed)
I'll try to talk with Dr. Yetta Barre and then we'll address your papers.  You can have the other companies contact us for records.   When you get your insurance, let me know.

## 2011-09-28 NOTE — Progress Notes (Signed)
He's on temp disability.  Has another hearing upcoming.  I prev thought his eval for work would come through [place].  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  He had to cancel his insurance and had to cancel the planned neck surgery.    Currently can't sit for more than a few minutes due to leg pain Standing causes inc in foot and leg pain Trouble lifting due to neck pain Pain with walking more than short distances.   Weak grip, dropping objects, both hands, due to numbness in hands.    Per patient he'll have eval by MDs for social security.    Of note, sugar has been ~125-140 recently.  Continue on regular meds listed.    Meds, vitals, and allergies reviewed.   ROS: See HPI.  Otherwise, noncontributory.  nad but uncomfortable in the chair, walks with limp rrr ctab Dec in sensation to hands and feet B Normal DP and radial pulses 4/5 strength to BLE at shoulder due to pain Grip is overcomeable B 4/5 strength for hip flexion due to pain  Diabetic foot exam: Normal inspection No skin breakdown No calluses  Normal DP pulses Dec sensation to light touch and monofilament

## 2011-09-29 NOTE — Assessment & Plan Note (Signed)
With recently control of sugars.  Will return for labs later in the spring.  I have called Dr. Yetta Barre office.  From my standpoint, it appears that the neuropathy is affecting his sensation in hands, feet.  EMG documented neuropathy in 3/12 noted.  He continues to have ongoing ortho sources of pain.  I don't see how he would be able to return to employment in his current condition. I will defer the disability exam to the evaluating MDs with the social security office.

## 2011-10-01 ENCOUNTER — Telehealth: Payer: Self-pay

## 2011-10-01 NOTE — Telephone Encounter (Signed)
LMOVM asking for Judeth Cornfield to provide a fax number.

## 2011-10-01 NOTE — Telephone Encounter (Signed)
Tony Manning with [employer] is handling long term disability claim for pt. Pt seen by Dr Para March on 09/28/11 and Judeth Cornfield needs to verify if Dr Para March gave pt any restrictions or limitations. Please call Judeth Cornfield at [phone].

## 2011-10-01 NOTE — Telephone Encounter (Signed)
Send them my last note, have them call back if any questions. Thanks.

## 2011-10-02 NOTE — Telephone Encounter (Signed)
LMOVM to call with fax number.

## 2011-10-05 NOTE — Telephone Encounter (Signed)
Received fax number and faxed information on Friday, Oct 02, 2011

## 2011-10-12 ENCOUNTER — Encounter: Payer: Self-pay | Admitting: Family Medicine

## 2011-10-12 ENCOUNTER — Telehealth: Payer: Self-pay | Admitting: Family Medicine

## 2011-10-12 NOTE — Telephone Encounter (Signed)
Pt is calling to know what Dr. Para March was able to find out about the situation that was discovered in the office at the last visit? He is wondering where he stands as of now.

## 2011-10-12 NOTE — Telephone Encounter (Signed)
I had documented what I could at last OV.  I talked with Dr. Yetta Barre and I am awaiting documentation from him.

## 2011-10-12 NOTE — Progress Notes (Signed)
  Subjective:    Patient ID: Tony Manning, male    DOB: [birth year], 51 y.o.   MRN: [id number]  HPI    Review of Systems     Objective:   Physical Exam        Assessment & Plan:  Late entry- I have d/w pt Dr. Yetta Barre via phone on 10/09/11 and I'll await his note re: the patient's ability/inability to work due to [employer] conditions.

## 2011-10-12 NOTE — Telephone Encounter (Signed)
Patient advised.

## 2011-11-09 ENCOUNTER — Telehealth: Payer: Self-pay | Admitting: *Deleted

## 2011-11-09 NOTE — Telephone Encounter (Signed)
Ms. Worthy Flank is the Registered Nurse at [employer] working on this patient's case.  She says she sees that you deferred to two other MD's on this patient's restrictions  and they have given "no restrictions".  She wants to be sure that you are giving "no restrictions" also because you are the only MD that has seen him this year.  [phone]   Ext [phone]

## 2011-11-09 NOTE — Telephone Encounter (Signed)
I called again, LMOVM.  I'll await return call.

## 2011-11-09 NOTE — Telephone Encounter (Signed)
I called back.  No answer.  I LMOVM for them to call back.  I am awaiting final note from Dr. Yetta Barre.  In meantime, refer back to my last note re: his work status.

## 2011-11-09 NOTE — Telephone Encounter (Signed)
Received a call from Darlina Rumpf with [employer] company requesting to speak with Dr. Lianne Bushy nurse.

## 2011-12-18 ENCOUNTER — Other Ambulatory Visit: Payer: Self-pay | Admitting: *Deleted

## 2011-12-18 MED ORDER — GLIMEPIRIDE 4 MG PO TABS: 4.0000 mg | ORAL_TABLET | Freq: Two times a day (BID) | ORAL | Status: DC

## 2011-12-21 ENCOUNTER — Encounter: Payer: Self-pay | Admitting: Family Medicine

## 2011-12-21 ENCOUNTER — Ambulatory Visit (INDEPENDENT_AMBULATORY_CARE_PROVIDER_SITE_OTHER): Payer: Self-pay | Admitting: Family Medicine

## 2011-12-21 VITALS — BP 122/78 | HR 87 | Temp 99.6°F | Wt 225.0 lb

## 2011-12-21 DIAGNOSIS — Z720 Tobacco use: Secondary | ICD-10-CM | POA: Insufficient documentation

## 2011-12-21 DIAGNOSIS — F17200 Nicotine dependence, unspecified, uncomplicated: Secondary | ICD-10-CM

## 2011-12-21 DIAGNOSIS — R05 Cough: Secondary | ICD-10-CM

## 2011-12-21 DIAGNOSIS — R059 Cough, unspecified: Secondary | ICD-10-CM

## 2011-12-21 MED ORDER — ALBUTEROL SULFATE HFA 108 (90 BASE) MCG/ACT IN AERS: 2.0000 | INHALATION_SPRAY | Freq: Four times a day (QID) | RESPIRATORY_TRACT | Status: DC | PRN

## 2011-12-21 MED ORDER — BENZONATATE 200 MG PO CAPS: 200.0000 mg | ORAL_CAPSULE | Freq: Three times a day (TID) | ORAL | Status: AC | PRN

## 2011-12-21 NOTE — Assessment & Plan Note (Signed)
Lungs clear.  I doubt the usefulness of abx in this situation with such a benign exam. Would use SABA and/or tessalon for cough and f/u or call back prn.  Supportive tx o/w.  He agrees.

## 2011-12-21 NOTE — Assessment & Plan Note (Signed)
Routine instructions given for nicotine gum use.  He understood.

## 2011-12-21 NOTE — Patient Instructions (Addendum)
Use the inhaler for the cough and see if that helps.  Take the tessalon if the inhaler doesn't help.  Try the gum (flavored) to get off tobacco.

## 2011-12-21 NOTE — Progress Notes (Signed)
Cough.  Started about 4-5 days ago.  Taking otc meds for cough.  Had a fever last night.  No sputum, cough is episodic and irregular.  Chest is sore from coughing and he feels tight.  Has used inhalers before when he was ill.  No ST, no ear pain.  No rhinorrhea.  No rash.  No diarrhea.  Sugar has been ~120 in AM.  No wheeze.  No sick contacts.    He's back dipping and we discussed.  He'll use the gum.    Meds, vitals, and allergies reviewed.   ROS: See HPI.  Otherwise, noncontributory.  nad ncat Mmm Tm wnl Nasal exam wnl Lungs ctab but dry cough noted rrr Ext w/o edema

## 2012-01-01 ENCOUNTER — Telehealth: Payer: Self-pay

## 2012-01-01 MED ORDER — TRAMADOL HCL 50 MG PO TABS: 50.0000 mg | ORAL_TABLET | Freq: Three times a day (TID) | ORAL | Status: AC | PRN

## 2012-01-01 NOTE — Telephone Encounter (Signed)
Sent.  Sedation caution.

## 2012-01-01 NOTE — Telephone Encounter (Signed)
Pt request Tramadol sent to [facility] pharmacy for neck and both shoulder pain(pt has vertebra problem). Pt last seen 12/21/11 and forgot to ask for rx then. Pt can be reached at [phone].Please advise.

## 2012-01-14 ENCOUNTER — Encounter: Payer: Self-pay | Admitting: Family Medicine

## 2012-01-14 ENCOUNTER — Ambulatory Visit (INDEPENDENT_AMBULATORY_CARE_PROVIDER_SITE_OTHER): Payer: Self-pay | Admitting: Family Medicine

## 2012-01-14 DIAGNOSIS — R0789 Other chest pain: Secondary | ICD-10-CM

## 2012-01-14 DIAGNOSIS — R071 Chest pain on breathing: Secondary | ICD-10-CM

## 2012-01-14 DIAGNOSIS — R079 Chest pain, unspecified: Secondary | ICD-10-CM

## 2012-01-14 DIAGNOSIS — R454 Irritability and anger: Secondary | ICD-10-CM

## 2012-01-14 MED ORDER — TRAMADOL HCL 50 MG PO TABS: 50.0000 mg | ORAL_TABLET | Freq: Three times a day (TID) | ORAL | Status: DC | PRN

## 2012-01-14 MED ORDER — DULOXETINE HCL 30 MG PO CPEP: 30.0000 mg | ORAL_CAPSULE | Freq: Every day | ORAL | Status: DC

## 2012-01-14 NOTE — Progress Notes (Signed)
Anterior thorax pain.  Along the chest and abd wall.  No posterior pain.  Prev would come and go, recently has been constant (last few weeks).  Worse at night, wakes him up occ.  Burning sensation, bilateral.  Can feel a sharp pain occ. Happens at rest.  Can be worse with exertion.  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  Symptoms aren't better with aleve.  Tramadol will help some.  No NTG use.  No syncope, palpitations, vertigo.   Prev on effexor, not on meds now other than low dose of PRN TCA at night.  No Si/Hi.  More irritable, less patience. Family has noted it.  No altercations, but he's noted the change.    Meds, vitals, and allergies reviewed.   ROS: See HPI.  Otherwise, noncontributory.  nad ncat Mmm rrr ctab abd soft, not ttp, normal bs Ext w/o edema

## 2012-01-14 NOTE — Patient Instructions (Signed)
Stop the amitriptyline and start cymbalta.  Take 1 a day.  See if that helps.  Take care.

## 2012-01-18 DIAGNOSIS — R454 Irritability and anger: Secondary | ICD-10-CM | POA: Insufficient documentation

## 2012-01-18 NOTE — Assessment & Plan Note (Signed)
No SI/HI, d/wpt about cymbalta.  See above.  He agrees.

## 2012-01-18 NOTE — Assessment & Plan Note (Addendum)
Unlikely cardiac.  EKG reviewed.  If neuropathy, MSK , then cymbalta may help.  Potential interaction with tramadol discussed.  cymbalta may help mood and neuropathy.  He agrees for trial.

## 2012-02-16 ENCOUNTER — Other Ambulatory Visit: Payer: Self-pay | Admitting: *Deleted

## 2012-02-16 MED ORDER — METFORMIN HCL 1000 MG PO TABS: 1000.0000 mg | ORAL_TABLET | Freq: Two times a day (BID) | ORAL | Status: DC

## 2012-02-18 ENCOUNTER — Telehealth: Payer: Self-pay

## 2012-02-18 NOTE — Telephone Encounter (Signed)
Pt request samples of Cymbalta. No samples available; pt said no insurance until July and will be off med without samples. Gave pt Cymbalta free 30 day trial card from pharmaceutical co to take to pharmacy.

## 2012-02-18 NOTE — Telephone Encounter (Signed)
That was reasonable.  Thanks.  

## 2012-05-04 ENCOUNTER — Other Ambulatory Visit: Payer: Self-pay | Admitting: *Deleted

## 2012-05-04 MED ORDER — INSULIN PEN NEEDLE 31G X 5 MM MISC: Status: DC

## 2012-05-06 ENCOUNTER — Telehealth: Payer: Self-pay | Admitting: Family Medicine

## 2012-05-06 ENCOUNTER — Telehealth: Payer: Self-pay | Admitting: *Deleted

## 2012-05-06 MED ORDER — VARENICLINE TARTRATE 1 MG PO TABS: 1.0000 mg | ORAL_TABLET | Freq: Two times a day (BID) | ORAL | Status: AC

## 2012-05-06 MED ORDER — VARENICLINE TARTRATE 0.5 MG X 11 & 1 MG X 42 PO MISC: ORAL | Status: AC

## 2012-05-06 NOTE — Telephone Encounter (Signed)
rx sent.  Use the starter pack first.  Stop smoking about 7 days after starting med.  If mood changes or bothersome dreams, stop medicine and notify me.

## 2012-05-06 NOTE — Telephone Encounter (Signed)
Pt called asking for a prescription for Chantix. States he was on it years ago and it works for him.

## 2012-05-06 NOTE — Telephone Encounter (Signed)
LMOVM of home phone. 

## 2012-05-16 ENCOUNTER — Telehealth: Payer: Self-pay | Admitting: *Deleted

## 2012-05-16 MED ORDER — TRAMADOL HCL 50 MG PO TABS: 50.0000 mg | ORAL_TABLET | Freq: Three times a day (TID) | ORAL | Status: AC | PRN

## 2012-05-16 NOTE — Telephone Encounter (Signed)
Received faxed refill request from pharmacy for Tramadol 50 mg, take one by mouth every 8 hours as needed for pain (sedation caution). Medication is no longer on med sheet. Called and spoke to patient and was advised that he requested this refill because he takes it as needed for his chest pain which is probably caused by nerve damage from diabetes. Patient stated that he was given Cymbalta, but stopped taking it because of the side effects of the way that it made him feel. Is it okay to refill the Tramadol?

## 2012-05-16 NOTE — Telephone Encounter (Signed)
Sent!

## 2012-05-17 NOTE — Telephone Encounter (Signed)
Error

## 2012-08-11 ENCOUNTER — Other Ambulatory Visit: Payer: Self-pay | Admitting: *Deleted

## 2012-08-11 MED ORDER — INSULIN GLARGINE 100 UNIT/ML ~~LOC~~ SOLN: 34.0000 [IU] | Freq: Every day | SUBCUTANEOUS | Status: DC

## 2012-08-15 ENCOUNTER — Ambulatory Visit (INDEPENDENT_AMBULATORY_CARE_PROVIDER_SITE_OTHER): Payer: Medicare Other | Admitting: Family Medicine

## 2012-08-15 ENCOUNTER — Encounter: Payer: Self-pay | Admitting: Family Medicine

## 2012-08-15 VITALS — BP 106/76 | HR 74 | Temp 98.3°F | Wt 218.0 lb

## 2012-08-15 DIAGNOSIS — E119 Type 2 diabetes mellitus without complications: Secondary | ICD-10-CM

## 2012-08-15 DIAGNOSIS — E1149 Type 2 diabetes mellitus with other diabetic neurological complication: Secondary | ICD-10-CM

## 2012-08-15 LAB — LIPID PANEL
Cholesterol: 169 mg/dL (ref 0–200)
HDL: 39.4 mg/dL (ref >39.00)
LDL Cholesterol: 97 mg/dL (ref 0–99)
Total CHOL/HDL Ratio: 4
Triglycerides: 162 mg/dL — ABNORMAL HIGH (ref 0.0–149.0)
VLDL: 32.4 mg/dL (ref 0.0–40.0)

## 2012-08-15 LAB — COMPREHENSIVE METABOLIC PANEL
ALT: 35 U/L (ref 0–53)
AST: 25 U/L (ref 0–37)
Albumin: 4.2 g/dL (ref 3.5–5.2)
Alkaline Phosphatase: 67 U/L (ref 39–117)
BUN: 15 mg/dL (ref 6–23)
CO2: 26 mEq/L (ref 19–32)
Calcium: 9.7 mg/dL (ref 8.4–10.5)
Chloride: 102 mEq/L (ref 96–112)
Creatinine, Ser: 0.9 mg/dL (ref 0.4–1.5)
GFR: 91.66 mL/min (ref >60.00)
Glucose, Bld: 250 mg/dL — ABNORMAL HIGH (ref 70–99)
Potassium: 4 mEq/L (ref 3.5–5.1)
Sodium: 136 mEq/L (ref 135–145)
Total Bilirubin: 0.5 mg/dL (ref 0.3–1.2)
Total Protein: 7.7 g/dL (ref 6.0–8.3)

## 2012-08-15 LAB — MICROALBUMIN / CREATININE URINE RATIO
Creatinine,U: 177.1 mg/dL
Microalb Creat Ratio: 0.8 mg/g (ref 0.0–30.0)
Microalb, Ur: 1.5 mg/dL (ref 0.0–1.9)

## 2012-08-15 LAB — HEMOGLOBIN A1C: Hgb A1c MFr Bld: 8.5 % — ABNORMAL HIGH (ref 4.6–6.5)

## 2012-08-15 NOTE — Assessment & Plan Note (Signed)
He would qualify for shoes if desired.  He has inserts.  Callus is small.  D/w pt about foot inspection/care.  He'll check on the shoe cost and we can order if needed.  Needs more attention for his sugar in general.  Work on diet.  If sugar>120 add a unit of insulin, if <80 dec a unit of insulin per day.  He agrees.  See notes on labs.  We may need to change his next f/u visit date depending on lab results.

## 2012-08-15 NOTE — Progress Notes (Signed)
Diabetes:  Using medications without difficulties: yes Hypoglycemic episodes: rare, did have an episode yesterday after supper.   Hyperglycemic episodes: previous, due to having to 'stretch' his insulin.  Now back on regular dose.  Feet problems: h/o tingling.   Blood Sugars averaging: see below.  He wanted to know about diabetic shoes.   He has DM2 neuropathy.   Yesterday AM sugar was 212  Meds, vitals, and allergies reviewed.   ROS: See HPI.  Otherwise negative.    GEN: nad, alert and oriented HEENT: mucous membranes moist NECK: supple w/o LA CV: rrr. PULM: ctab, no inc wob ABD: soft, +bs EXT: no edema SKIN: no acute rash  Diabetic foot exam: Normal inspection No skin breakdown Small callus on the L 1st toe.  Normal DP pulses Stocking distribution change in sensation to light touch and monofilament Nails normal

## 2012-08-15 NOTE — Patient Instructions (Addendum)
Go to the lab on the way out.  We'll contact you with your lab report.  Keep working on your diet and adjusting your insulin.  Take care.  I/m glad you got a flu shot already.

## 2012-09-19 ENCOUNTER — Other Ambulatory Visit: Payer: Self-pay

## 2012-09-23 ENCOUNTER — Encounter: Payer: Self-pay | Admitting: Family Medicine

## 2012-10-03 ENCOUNTER — Encounter: Payer: Self-pay | Admitting: Family Medicine

## 2012-10-18 ENCOUNTER — Encounter: Payer: Self-pay | Admitting: Family Medicine

## 2012-10-18 ENCOUNTER — Ambulatory Visit (INDEPENDENT_AMBULATORY_CARE_PROVIDER_SITE_OTHER)
Admission: RE | Admit: 2012-10-18 | Discharge: 2012-10-18 | Disposition: A | Discharge status: Home / Self Care | Payer: Medicare Other | Source: Ambulatory Visit | Attending: Family Medicine | Admitting: Family Medicine

## 2012-10-18 ENCOUNTER — Ambulatory Visit (INDEPENDENT_AMBULATORY_CARE_PROVIDER_SITE_OTHER): Payer: Medicare Other | Admitting: Family Medicine

## 2012-10-18 VITALS — BP 128/72 | HR 79 | Temp 98.5°F | Wt 223.0 lb

## 2012-10-18 DIAGNOSIS — M25579 Pain in unspecified ankle and joints of unspecified foot: Secondary | ICD-10-CM

## 2012-10-18 NOTE — Progress Notes (Signed)
Sugar has been ~115-130 in AM.   Over the last week the R ankle had been variably puffy and sore to the touch.  It was initially itchy, but this is better.  He has pain with walking.  He can bear weight with some pain.  He didn't know that he injured it, but he had stepped in a hole a few days before the sx started.  It didn't bruise.    Meds, vitals, and allergies reviewed.   ROS: See HPI.  Otherwise, noncontributory.  nad Able to bear weight in clinic.  R ankle with no bruising and normal ROM but pain on resisted dorsiflexion and plantar flexion.  Minimally puffy inferior to med mal but not laterally.  Distally with normal ROM and cap refill

## 2012-10-18 NOTE — Patient Instructions (Addendum)
We'll contact you with your xray report. I would use an over the counter lace up ankle brace during the day.  When you take it off at night, spell the alphabet with your foot.  Gradually wean out of the brace as the pain is improving.

## 2012-10-19 DIAGNOSIS — M25579 Pain in unspecified ankle and joints of unspecified foot: Secondary | ICD-10-CM | POA: Insufficient documentation

## 2012-10-19 NOTE — Assessment & Plan Note (Signed)
xrays neg.  Likely tendonitis from strain. Would use OTC lace up brace with ROM exercises when out of brace.  No motor deficit or abnormal laxity.  D/w pt.  He agrees.

## 2012-11-08 ENCOUNTER — Encounter: Payer: Self-pay | Admitting: Family Medicine

## 2012-11-08 ENCOUNTER — Ambulatory Visit (INDEPENDENT_AMBULATORY_CARE_PROVIDER_SITE_OTHER): Payer: Medicare Other | Admitting: Family Medicine

## 2012-11-08 ENCOUNTER — Ambulatory Visit: Payer: Medicare Other | Admitting: Family Medicine

## 2012-11-08 VITALS — BP 130/76 | HR 67 | Temp 97.8°F | Wt 221.0 lb

## 2012-11-08 DIAGNOSIS — M25571 Pain in right ankle and joints of right foot: Secondary | ICD-10-CM

## 2012-11-08 DIAGNOSIS — M25579 Pain in unspecified ankle and joints of unspecified foot: Secondary | ICD-10-CM

## 2012-11-08 NOTE — Patient Instructions (Addendum)
Wear the boot for now while weight bearing and we'll be in touch about follow up with Dr. Patsy Lager.   Take care.

## 2012-11-08 NOTE — Progress Notes (Signed)
Still with some R ankle pain.  Since last OV, used brace without much improvement.  No other injuries except scraping his R foot on a dog cage a few days ago.  He's been trying to stay off his foot.  Much less pain if not weight bearing.  Still occ puffy on the medial but not lateral side of the ankle.  Pain with weight bearing is not immediate but after he's been up on his foot for about 1 hour.  Also now having pain up the back of the R calf, medially, when the ankle is hurting. "It's like it's being pulled tight."  No anterior shin pain.  Using heat and ice, ice helps a little more than heat.    Meds, vitals, and allergies reviewed.   ROS: See HPI.  Otherwise, noncontributory.  nad R foot with superficial abrasion on the later toes noted, doesn't appear infected.  Normal rom at the ankle but slightly puffy and tender inferior to the medial mal.  Pain with single leg heel elevation on R but not with plantar flexion of the great toe.  DP and PT pulse intact No bruising.  Lateral mal not ttp Mortise intact Calf not ttp

## 2012-11-08 NOTE — Assessment & Plan Note (Addendum)
Improved in boot today.  Likely posterior tibialis tendonitis.  Will d/w Dr. Patsy Lager about follow up.  Anatomy d/w pt.  He agrees with plan.  Addendum- he'll f/u with Dr. Patsy Lager in about 2 weeks.  Appreciate help from Dr. Salena Saner.

## 2012-12-10 ENCOUNTER — Other Ambulatory Visit: Payer: Self-pay | Admitting: Family Medicine

## 2012-12-10 DIAGNOSIS — E119 Type 2 diabetes mellitus without complications: Secondary | ICD-10-CM

## 2012-12-15 ENCOUNTER — Other Ambulatory Visit: Payer: Medicare Other

## 2012-12-22 ENCOUNTER — Encounter: Payer: Self-pay | Admitting: Family Medicine

## 2013-01-02 ENCOUNTER — Encounter: Payer: Self-pay | Admitting: Family Medicine

## 2013-01-04 ENCOUNTER — Other Ambulatory Visit: Payer: Self-pay | Admitting: *Deleted

## 2013-01-04 MED ORDER — GLIMEPIRIDE 4 MG PO TABS: 4.0000 mg | ORAL_TABLET | Freq: Two times a day (BID) | ORAL | Status: DC

## 2013-02-09 ENCOUNTER — Other Ambulatory Visit (INDEPENDENT_AMBULATORY_CARE_PROVIDER_SITE_OTHER): Payer: Medicare Other

## 2013-02-09 DIAGNOSIS — E119 Type 2 diabetes mellitus without complications: Secondary | ICD-10-CM

## 2013-02-09 LAB — HEMOGLOBIN A1C: Hgb A1c MFr Bld: 8.3 % — ABNORMAL HIGH (ref 4.6–6.5)

## 2013-02-16 ENCOUNTER — Ambulatory Visit (INDEPENDENT_AMBULATORY_CARE_PROVIDER_SITE_OTHER): Payer: Medicare Other | Admitting: Family Medicine

## 2013-02-16 ENCOUNTER — Other Ambulatory Visit: Payer: Self-pay | Admitting: Family Medicine

## 2013-02-16 ENCOUNTER — Encounter: Payer: Self-pay | Admitting: Family Medicine

## 2013-02-16 VITALS — BP 110/80 | HR 73 | Temp 97.8°F | Ht 70.5 in | Wt 211.5 lb

## 2013-02-16 DIAGNOSIS — Z0000 Encounter for general adult medical examination without abnormal findings: Secondary | ICD-10-CM

## 2013-02-16 DIAGNOSIS — M542 Cervicalgia: Secondary | ICD-10-CM

## 2013-02-16 DIAGNOSIS — E1149 Type 2 diabetes mellitus with other diabetic neurological complication: Secondary | ICD-10-CM

## 2013-02-16 DIAGNOSIS — Z1211 Encounter for screening for malignant neoplasm of colon: Secondary | ICD-10-CM

## 2013-02-16 DIAGNOSIS — I2510 Atherosclerotic heart disease of native coronary artery without angina pectoris: Secondary | ICD-10-CM

## 2013-02-16 DIAGNOSIS — Z23 Encounter for immunization: Secondary | ICD-10-CM

## 2013-02-16 DIAGNOSIS — I10 Essential (primary) hypertension: Secondary | ICD-10-CM

## 2013-02-16 DIAGNOSIS — E7800 Pure hypercholesterolemia, unspecified: Secondary | ICD-10-CM

## 2013-02-16 MED ORDER — METFORMIN HCL 1000 MG PO TABS: 1000.0000 mg | ORAL_TABLET | Freq: Two times a day (BID) | ORAL | Status: DC

## 2013-02-16 NOTE — Progress Notes (Signed)
I have personally reviewed the Medicare Annual Wellness questionnaire and have noted 1. The patient's medical and social history 2. Their use of alcohol, tobacco or illicit drugs 3. Their current medications and supplements 4. The patient's functional ability including ADL's, fall risks, home safety risks and hearing or visual             impairment. 5. Diet and physical activities 6. Evidence for depression or mood disorders  The patients weight, height, BMI have been recorded in the chart and visual acuity is per eye clinic.  I have made referrals, counseling and provided education to the patient based review of the above and I have provided the pt with a written personalized care plan for preventive services.  See scanned forms.  Routine anticipatory guidance given to patient.  See health maintenance. Flu 2013 Shingles at 59 PNA today Tetanus 2011 D/w patient [id number] for colon cancer screening, including IFOB vs. colonoscopy.  Risks and benefits of both were discussed and patient voiced understanding.  Pt elects for: IFOB Prostate cancer screening and PSA options (with potential risks and benefits of testing vs not testing) were discussed along with recent recs/guidelines.  He declined testing PSA at this point. Advance directive d/w pt.  Wife would be designated if incapacitated.   Cognitive function addressed- see scanned forms- and if abnormal then additional documentation follows.   Diabetes:  Using medications without difficulties:no Hypoglycemic episodes:no Hyperglycemic episodes:no Feet problems:see exam below, pain continues Blood Sugars averaging: 120-130 in AM eye exam within last year: yes A1c 8.3.   He's working on diet.    Elevated Cholesterol: Using medications without problems:yes Muscle aches: not from statin Diet compliance: discussed Exercise: some, can be limited by pain  Hypertension:   Using medication without problems or lightheadedness: yes Chest  pain- not with exertion; some over the last few weeks- he has f/u with cards pending.  Some of this may be from chest neuropathy- he has an "electrical" pain in the chest.  Can happen at rest Edema:no Short of breath:only with high exertion  L arm pain continues and this may be neck related.  He is going to f/u with surgery clinic about this.    PMH and SH reviewed  Meds, vitals, and allergies reviewed.   ROS: See HPI.  Otherwise negative.    GEN: nad, alert and oriented HEENT: mucous membranes moist NECK: supple w/o LA CV: rrr. PULM: ctab, no inc wob ABD: soft, +bs EXT: no edema SKIN: trace acute rash  Diabetic foot exam: Normal inspection No skin breakdown No calluses  Normal DP pulses Dec sensation to light touch and monofilament Nails normal

## 2013-02-16 NOTE — Patient Instructions (Addendum)
Go to the lab on the way out.  We'll contact you with your lab report.  Fill out the diabetes grid and drop it off for me. We'll go from there.   I would get a flu shot each fall.   Take care.

## 2013-02-17 DIAGNOSIS — Z0000 Encounter for general adult medical examination without abnormal findings: Secondary | ICD-10-CM | POA: Insufficient documentation

## 2013-02-17 NOTE — Assessment & Plan Note (Signed)
He'll check sugar pre/post meals and drop off the data, we'll go from there.  Continue current meds for now.

## 2013-02-17 NOTE — Assessment & Plan Note (Signed)
He'll call surgery about this.

## 2013-02-17 NOTE — Assessment & Plan Note (Signed)
He has atypical sx, he'll f/u with cards. No CP now.

## 2013-02-17 NOTE — Assessment & Plan Note (Signed)
See scanned forms.  Routine anticipatory guidance given to patient.  See health maintenance. Flu 2013 Shingles at 59 PNA today Tetanus 2011 D/w patient [id number] for colon cancer screening, including IFOB vs. colonoscopy.  Risks and benefits of both were discussed and patient voiced understanding.  Pt elects for: IFOB Prostate cancer screening and PSA options (with potential risks and benefits of testing vs not testing) were discussed along with recent recs/guidelines.  He declined testing PSA at this point. Advance directive d/w pt.  Wife would be designated if incapacitated.   Cognitive function addressed- see scanned forms- and if abnormal then additional documentation follows.

## 2013-02-17 NOTE — Assessment & Plan Note (Signed)
Reasonable control prev.  Continue as is.

## 2013-02-17 NOTE — Assessment & Plan Note (Signed)
Controlled, continue as is.  

## 2013-03-02 ENCOUNTER — Other Ambulatory Visit: Payer: Self-pay | Admitting: Family Medicine

## 2013-03-02 ENCOUNTER — Other Ambulatory Visit (INDEPENDENT_AMBULATORY_CARE_PROVIDER_SITE_OTHER): Payer: Medicare Other

## 2013-03-02 DIAGNOSIS — R195 Other fecal abnormalities: Secondary | ICD-10-CM

## 2013-03-02 DIAGNOSIS — Z1211 Encounter for screening for malignant neoplasm of colon: Secondary | ICD-10-CM

## 2013-03-02 LAB — FECAL OCCULT BLOOD, IMMUNOCHEMICAL: Fecal Occult Bld: POSITIVE

## 2013-03-06 ENCOUNTER — Encounter: Payer: Self-pay | Admitting: Internal Medicine

## 2013-03-07 ENCOUNTER — Telehealth: Payer: Self-pay | Admitting: *Deleted

## 2013-03-07 NOTE — Telephone Encounter (Signed)
Dr. Para March advised the patient that he is likely to need mealtime insulin (short acting).  Patient is willing to do this.  Please send in Rx to pharmacy.  Lab and OV appt in 3 months scheduled.

## 2013-03-08 MED ORDER — INSULIN ASPART 100 UNIT/ML FLEXPEN: PEN_INJECTOR | SUBCUTANEOUS | Status: DC

## 2013-03-08 MED ORDER — INSULIN PEN NEEDLE 31G X 5 MM MISC: Status: DC

## 2013-03-08 NOTE — Telephone Encounter (Signed)
Left message on machine to call back  

## 2013-03-08 NOTE — Telephone Encounter (Signed)
rx sent.   Would check sugar before each meal.  If sugar 140 or higher before the mean, then give 2 units of insulin (thus max 6 units of short acting insulin a day for now, split across 3 meals).  If sugar 139 or lower before meal, then skip the 2 units at that meal. This is starting slow to dec chance of hypoglycemia.  Have him update me with his sugar readings next week. Thanks.

## 2013-03-08 NOTE — Telephone Encounter (Signed)
Patient notified as instructed by telephone. Patient stated that he understood the instructions.

## 2013-03-17 ENCOUNTER — Encounter: Payer: Self-pay | Admitting: Internal Medicine

## 2013-03-17 ENCOUNTER — Ambulatory Visit (AMBULATORY_SURGERY_CENTER): Payer: Medicare Other | Admitting: *Deleted

## 2013-03-17 VITALS — Ht 70.5 in | Wt 215.0 lb

## 2013-03-17 DIAGNOSIS — K921 Melena: Secondary | ICD-10-CM

## 2013-03-17 MED ORDER — MOVIPREP 100 G PO SOLR: 1.0000 | Freq: Once | ORAL | Status: DC

## 2013-03-17 NOTE — Progress Notes (Signed)
Denies allergies to eggs or soy products. Denies complications with sedation or anesthesia. 

## 2013-03-23 ENCOUNTER — Encounter: Payer: Self-pay | Admitting: Internal Medicine

## 2013-03-23 ENCOUNTER — Ambulatory Visit (AMBULATORY_SURGERY_CENTER): Payer: Medicare Other | Admitting: Internal Medicine

## 2013-03-23 VITALS — BP 142/72 | HR 68 | Temp 97.5°F | Resp 33 | Ht 70.0 in | Wt 215.0 lb

## 2013-03-23 DIAGNOSIS — Z1211 Encounter for screening for malignant neoplasm of colon: Secondary | ICD-10-CM

## 2013-03-23 DIAGNOSIS — K921 Melena: Secondary | ICD-10-CM

## 2013-03-23 DIAGNOSIS — D126 Benign neoplasm of colon, unspecified: Secondary | ICD-10-CM

## 2013-03-23 LAB — GLUCOSE, CAPILLARY
Glucose-Capillary: 144 mg/dL — ABNORMAL HIGH (ref 70–99)
Glucose-Capillary: 126 mg/dL — ABNORMAL HIGH (ref 70–99)

## 2013-03-23 MED ORDER — SODIUM CHLORIDE 0.9 % IV SOLN: 500.0000 mL | INTRAVENOUS | Status: DC

## 2013-03-23 NOTE — Op Note (Signed)
[facility] [address] [place], [address]   COLONOSCOPY PROCEDURE REPORT  PATIENT: Tony, Manning  MR#: [id number] BIRTHDATE: 02-Oct-1960 , 52  yrs. old GENDER: Male ENDOSCOPIST: Beverley Fiedler, MD REFERRED XB:JYNWGN Duncan, M.D. PROCEDURE DATE:  03/23/2013 PROCEDURE:   Colonoscopy with snare polypectomy ASA CLASS:   Class II INDICATIONS:average risk screening, first colonoscopy, and heme-positive stool. MEDICATIONS: MAC sedation, administered by CRNA and propofol (Diprivan) 200mg  IV  DESCRIPTION OF PROCEDURE:   After the risks benefits and alternatives of the procedure were thoroughly explained, informed consent was obtained.  A digital rectal exam revealed hemorrhoids. The LB [id number]  endoscope was introduced through the anus and advanced to the cecum, which was identified by both the appendix and ileocecal valve. No adverse events experienced.   The quality of the prep was good, using MoviPrep  The instrument was then slowly withdrawn as the colon was fully examined.  COLON FINDINGS: A sessile polyp measuring 5 mm in size was found in the ascending colon.  A polypectomy was performed with a cold snare.  The resection was complete and the polyp tissue was completely retrieved.   There was moderate diverticulosis noted in the descending colon and sigmoid colon with associated muscular hypertrophy.  Retroflexed views revealed external hemorrhoids. The time to cecum=2 minutes 11 seconds.  Withdrawal time=9 minutes 42 seconds.  The scope was withdrawn and the procedure completed. COMPLICATIONS: There were no complications.  ENDOSCOPIC IMPRESSION: 1.   Sessile polyp measuring 5 mm in size was found in the ascending colon; polypectomy was performed with a cold snare 2.   There was moderate diverticulosis noted in the descending colon and sigmoid colon  RECOMMENDATIONS: 1.  Await pathology results 2.  High fiber diet 3.  If the polyp removed  today is proven to be an adenomatous (pre-cancerous) polyp, you will need a repeat colonoscopy in 5 years.  Otherwise you should continue to follow colorectal cancer screening guidelines for "routine risk" patients with colonoscopy in 10 years.  You will receive a letter within 1-2 weeks with the results of your biopsy as well as final recommendations.  Please call my office if you have not received a letter after 3 weeks.   eSigned:  Beverley Fiedler, MD 03/23/2013 8:57 AM   cc: The Patient and Crawford Givens, MD

## 2013-03-23 NOTE — Progress Notes (Signed)
Lidocaine-40mg IV prior to Propofol InductionPropofol given over incremental dosages 

## 2013-03-23 NOTE — Progress Notes (Signed)
Patient did not experience any of the following events: a burn prior to discharge; a fall within the facility; wrong site/side/patient/procedure/implant event; or a hospital transfer or hospital admission upon discharge from the facility. (G8907) Patient did not have preoperative order for IV antibiotic SSI prophylaxis. (G8918)  

## 2013-03-23 NOTE — Patient Instructions (Addendum)
YOU HAD AN ENDOSCOPIC PROCEDURE TODAY AT THE [facility]: Refer to the procedure report that was given to you for any specific questions about what was found during the examination.  If the procedure report does not answer your questions, please call your gastroenterologist to clarify.  If you requested that your care partner not be given the details of your procedure findings, then the procedure report has been included in a sealed envelope for you to review at your convenience later.  YOU SHOULD EXPECT: Some feelings of bloating in the abdomen. Passage of more gas than usual.  Walking can help get rid of the air that was put into your GI tract during the procedure and reduce the bloating. If you had a lower endoscopy (such as a colonoscopy or flexible sigmoidoscopy) you may notice spotting of blood in your stool or on the toilet paper. If you underwent a bowel prep for your procedure, then you may not have a normal bowel movement for a few days.  DIET: Your first meal following the procedure should be a light meal and then it is ok to progress to your normal diet.  A half-sandwich or bowl of soup is an example of a good first meal.  Heavy or fried foods are harder to digest and may make you feel nauseous or bloated.  Likewise meals heavy in dairy and vegetables can cause extra gas to form and this can also increase the bloating.  Drink plenty of fluids but you should avoid alcoholic beverages for 24 hours.  ACTIVITY: Your care partner should take you home directly after the procedure.  You should plan to take it easy, moving slowly for the rest of the day.  You can resume normal activity the day after the procedure however you should NOT DRIVE or use heavy machinery for 24 hours (because of the sedation medicines used during the test).    SYMPTOMS TO REPORT IMMEDIATELY: A gastroenterologist can be reached at any hour.  During normal business hours, 8:30 AM to 5:00 PM Monday through Friday,  call [phone].  After hours and on weekends, please call the GI answering service at [phone] who will take a message and have the physician on call contact you.   Following lower endoscopy (colonoscopy or flexible sigmoidoscopy):  Excessive amounts of blood in the stool  Significant tenderness or worsening of abdominal pains  Swelling of the abdomen that is new, acute  Fever of 100F or higher    FOLLOW UP: If any biopsies were taken you will be contacted by phone or by letter within the next 1-3 weeks.  Call your gastroenterologist if you have not heard about the biopsies in 3 weeks.  Our staff will call the home number listed on your records the next business day following your procedure to check on you and address any questions or concerns that you may have at that time regarding the information given to you following your procedure. This is a courtesy call and so if there is no answer at the home number and we have not heard from you through the emergency physician on call, we will assume that you have returned to your regular daily activities without incident.  SIGNATURES/CONFIDENTIALITY: You and/or your care partner have signed paperwork which will be entered into your electronic medical record.  These signatures attest to the fact that that the information above on your After Visit Summary has been reviewed and is understood.  Full responsibility of the confidentiality   of this discharge information lies with you and/or your care-partner.   Polyp, diverticulosi, and high fiber diet information given.  Dr. Rhea Belton will advise you about next colonoscopy after pathology reports are reviewe.

## 2013-03-23 NOTE — Progress Notes (Signed)
Called to room to assist during endoscopic procedure.  Patient ID and intended procedure confirmed with present staff. Received instructions for my participation in the procedure from the performing physician.  

## 2013-03-24 ENCOUNTER — Telehealth: Payer: Self-pay | Admitting: *Deleted

## 2013-03-24 NOTE — Telephone Encounter (Signed)
  Follow up Call-  Call back number 03/23/2013  Post procedure Call Back phone  # [phone]  Permission to leave phone message Yes     Patient questions:  Left message to call us if necessary.

## 2013-03-29 ENCOUNTER — Encounter: Payer: Self-pay | Admitting: Internal Medicine

## 2013-04-03 ENCOUNTER — Ambulatory Visit (INDEPENDENT_AMBULATORY_CARE_PROVIDER_SITE_OTHER): Payer: Medicare Other | Admitting: Family Medicine

## 2013-04-03 ENCOUNTER — Encounter: Payer: Self-pay | Admitting: Family Medicine

## 2013-04-03 VITALS — BP 116/76 | HR 75 | Temp 97.6°F | Wt 213.2 lb

## 2013-04-03 DIAGNOSIS — G43909 Migraine, unspecified, not intractable, without status migrainosus: Secondary | ICD-10-CM

## 2013-04-03 DIAGNOSIS — R519 Headache, unspecified: Secondary | ICD-10-CM | POA: Insufficient documentation

## 2013-04-03 DIAGNOSIS — E1149 Type 2 diabetes mellitus with other diabetic neurological complication: Secondary | ICD-10-CM

## 2013-04-03 NOTE — Assessment & Plan Note (Signed)
Will try repeat dose of TCA this PM, he is some better today.  Okay for outpatient f/u.  Likely migraine.

## 2013-04-03 NOTE — Assessment & Plan Note (Signed)
Will try novolog after meals and then he'll notify us. He agrees to plan. Okay for outpatient f/u.

## 2013-04-03 NOTE — Patient Instructions (Signed)
Go home and get some rest. Take an other amitriptyline tonight.  If the headaches aren't improving, then let me know.  When the headaches are better, try taking the novolog after you eat.  See if that helps the nausea and let me know.  Take care.

## 2013-04-03 NOTE — Progress Notes (Signed)
He tried taking the novolog before meals but had persistent nausea.  He didn't try it after the meals yet.  Stopping the medicine helped with the nausea.  The food itself didn't matter.  It was helping his sugar some. He can still tolerate the lantus.   He's had L frontal HA for the last few weeks. Throbs.  No aura.  Dull ache now, occ worse.  some photophobia- closing eyes helps some.  No phonophobia. H/o migraines but this isn't similar.  No vomiting in last 2 weeks.  He took amitriptyline last night.  It helped the HA some.    Meds, vitals, and allergies reviewed.   ROS: See HPI.  Otherwise, noncontributory.  GEN: nad, alert and oriented HEENT: mucous membranes moist, tm wnl, nasal and OP exam wnl, sinuses not ttp NECK: supple w/o LA CV: rrr. PULM: ctab, no inc wob ABD: soft, +bs EXT: no edema SKIN: no acute rash CN 2-12 wnl B, sensation at baseline, some loss in hands and feet from DM and neck- no changes.  Motor testing x4 at baseline.

## 2013-05-10 ENCOUNTER — Encounter: Payer: Medicare Other | Admitting: Internal Medicine

## 2013-05-30 ENCOUNTER — Other Ambulatory Visit: Payer: Self-pay | Admitting: Family Medicine

## 2013-05-30 DIAGNOSIS — E1149 Type 2 diabetes mellitus with other diabetic neurological complication: Secondary | ICD-10-CM

## 2013-06-05 ENCOUNTER — Other Ambulatory Visit (INDEPENDENT_AMBULATORY_CARE_PROVIDER_SITE_OTHER): Payer: Medicare Other

## 2013-06-05 DIAGNOSIS — E1142 Type 2 diabetes mellitus with diabetic polyneuropathy: Secondary | ICD-10-CM

## 2013-06-05 DIAGNOSIS — E1149 Type 2 diabetes mellitus with other diabetic neurological complication: Secondary | ICD-10-CM

## 2013-06-05 LAB — HEMOGLOBIN A1C: Hgb A1c MFr Bld: 7.6 % — ABNORMAL HIGH (ref 4.6–6.5)

## 2013-06-08 ENCOUNTER — Ambulatory Visit (INDEPENDENT_AMBULATORY_CARE_PROVIDER_SITE_OTHER): Payer: Medicare Other | Admitting: Family Medicine

## 2013-06-08 ENCOUNTER — Encounter: Payer: Self-pay | Admitting: Family Medicine

## 2013-06-08 VITALS — BP 130/80 | HR 67 | Temp 97.7°F | Wt 214.0 lb

## 2013-06-08 DIAGNOSIS — Z23 Encounter for immunization: Secondary | ICD-10-CM

## 2013-06-08 DIAGNOSIS — M542 Cervicalgia: Secondary | ICD-10-CM

## 2013-06-08 DIAGNOSIS — E1149 Type 2 diabetes mellitus with other diabetic neurological complication: Secondary | ICD-10-CM

## 2013-06-08 DIAGNOSIS — I10 Essential (primary) hypertension: Secondary | ICD-10-CM

## 2013-06-08 DIAGNOSIS — G4733 Obstructive sleep apnea (adult) (pediatric): Secondary | ICD-10-CM

## 2013-06-08 MED ORDER — TRAMADOL HCL 50 MG PO TABS: 50.0000 mg | ORAL_TABLET | Freq: Four times a day (QID) | ORAL | Status: DC | PRN

## 2013-06-08 MED ORDER — LOSARTAN POTASSIUM 25 MG PO TABS: 25.0000 mg | ORAL_TABLET | Freq: Every day | ORAL | Status: DC

## 2013-06-08 MED ORDER — NITROGLYCERIN 0.4 MG/SPRAY TL SOLN: 1.0000 | Status: DC | PRN

## 2013-06-08 MED ORDER — CARVEDILOL 3.125 MG PO TABS: 3.1250 mg | ORAL_TABLET | Freq: Two times a day (BID) | ORAL | Status: DC

## 2013-06-08 MED ORDER — PRAVASTATIN SODIUM 40 MG PO TABS: 40.0000 mg | ORAL_TABLET | Freq: Every day | ORAL | Status: DC

## 2013-06-08 NOTE — Assessment & Plan Note (Signed)
He'll gradually restart his prev meds, ie 1 BP med per week.

## 2013-06-08 NOTE — Patient Instructions (Addendum)
Tony Manning will call about your referral. Gradually start back on the cholesterol and blood pressure medicine.   Recheck a1c in spring of 2014 before a visit.   Take care.

## 2013-06-08 NOTE — Assessment & Plan Note (Signed)
He'll restart amitriptyline at 25mg  at night and see if that helps the foot pain.  We can try lyrica in the future if needed.  He'll continue to work on diet and weight.  A1c improved, we didn't change meds at this point.

## 2013-06-08 NOTE — Progress Notes (Signed)
Diabetes:  Using medications without difficulties: yes Hypoglycemic episodes:no Hyperglycemic episodes:no Feet problems: see below.  Blood Sugars averaging: usually ~125-145 in AM.  He was asking about lyrica for his foot pain. We agreed to table this for now.  The TCA made him too drowsy at 50mg  a day.   We agreed to try that at 25mg  at night.  A1c improved.  Discussed. He has been a little more active.  Working on diet.  Discussed.  Using mealtime insulin with most meals.   He had run out of mult meds. Needs refills.  Needs some tramadol for the neck pain.  Had tried aleve instead of tramadol.   He needs help with his CPAP machine.  Asking for referral.  Dry mouth at night.  Needs eval for settings, pressure.    PMH and SH reviewed  Meds, vitals, and allergies reviewed.   ROS: See HPI.  Otherwise negative.    GEN: nad, alert and oriented HEENT: mucous membranes moist NECK: supple w/o LA CV: rrr. PULM: ctab, no inc wob ABD: soft, +bs EXT: no edema SKIN: no acute rash  Diabetic foot exam:  Normal inspection  No skin breakdown  Small callus on the L 1st toe.  Normal DP pulses  Stocking distribution change in sensation to light touch and monofilament  Nails normal

## 2013-06-08 NOTE — Assessment & Plan Note (Signed)
Continue tramadol for now, prn.  He is considering surgery eval again for this.

## 2013-06-13 ENCOUNTER — Other Ambulatory Visit: Payer: Self-pay | Admitting: Family Medicine

## 2013-06-19 ENCOUNTER — Telehealth: Payer: Self-pay | Admitting: Family Medicine

## 2013-06-19 NOTE — Telephone Encounter (Signed)
I'll work on the hard copy.  Thanks.  

## 2013-06-19 NOTE — Telephone Encounter (Signed)
Patient brought in a form for [employer] for you to fill out.  The form is in your incoming mail box.

## 2013-07-12 ENCOUNTER — Ambulatory Visit (INDEPENDENT_AMBULATORY_CARE_PROVIDER_SITE_OTHER): Payer: Medicare Other | Admitting: Family Medicine

## 2013-07-12 ENCOUNTER — Encounter: Payer: Self-pay | Admitting: Family Medicine

## 2013-07-12 VITALS — BP 128/80 | HR 82 | Temp 98.3°F | Wt 216.0 lb

## 2013-07-12 DIAGNOSIS — H8110 Benign paroxysmal vertigo, unspecified ear: Secondary | ICD-10-CM

## 2013-07-12 DIAGNOSIS — I951 Orthostatic hypotension: Secondary | ICD-10-CM

## 2013-07-12 MED ORDER — NITROGLYCERIN 0.4 MG SL SUBL: 0.4000 mg | SUBLINGUAL_TABLET | SUBLINGUAL | Status: DC | PRN

## 2013-07-12 NOTE — Progress Notes (Signed)
Vertigo- Dizzy.  Laying down he'll have spinning and sx.   Going on about 2 weeks, no tinnitus.  Episodes are usually brief.  H/o similar years ago.  It finally self resolved.   If stands too long, he'll get lightheaded and presyncopal. Not fully syncopal.  No new focal neuro changes.   Meds, vitals, and allergies reviewed.   ROS: See HPI.  Otherwise, noncontributory.  GEN: nad, alert and oriented HEENT: mucous membranes moist, tm wnl, nasal and op exam wnl NECK: supple w/o LA CV: rrr PULM: ctab, no inc wob ABD: soft, +bs EXT: no edema SKIN: no acute rash CN 2-12 wnl B, S/S/DTR wnl x4 DHP positive.

## 2013-07-12 NOTE — Patient Instructions (Signed)
Hold the losartan for now and see if that helps when you are standing up.  Try taking OTC meclizine 25-50mg  up to 3 times a day as needed for the room spinning when you lay down.  Take to cardiology about your BP meds.  Take care.  Use the bedside exercises.

## 2013-07-13 DIAGNOSIS — H8110 Benign paroxysmal vertigo, unspecified ear: Secondary | ICD-10-CM | POA: Insufficient documentation

## 2013-07-13 DIAGNOSIS — I951 Orthostatic hypotension: Secondary | ICD-10-CM | POA: Insufficient documentation

## 2013-07-13 NOTE — Assessment & Plan Note (Signed)
Hold ARB for now and f/u with cards.  He agrees.

## 2013-07-13 NOTE — Assessment & Plan Note (Signed)
Likely causing the nonorthostatic sx.  D/w pt.  rec bedside exercises and meclizine.  Fu prn.

## 2013-07-19 ENCOUNTER — Institutional Professional Consult (permissible substitution): Payer: Medicare Other | Admitting: Pulmonary Disease

## 2013-08-01 ENCOUNTER — Telehealth: Payer: Self-pay | Admitting: Family Medicine

## 2013-08-01 NOTE — Telephone Encounter (Signed)
Pt dropped off handicap placard form to be completed.  Best number to call patient [phone] / lt

## 2013-08-01 NOTE — Telephone Encounter (Signed)
I'll work on it.  Thanks.  

## 2013-08-15 ENCOUNTER — Telehealth: Payer: Self-pay | Admitting: Family Medicine

## 2013-08-15 NOTE — Telephone Encounter (Signed)
Patient Information:  Caller Name: Jhan  Phone: [phone]  Patient: Tony Manning, Tony Manning  Gender: Male  DOB: 14-Feb-1961  Age: 52 Years  PCP: Crawford Givens Clelia Croft) [facility])  Office Follow Up:  Does the office need to follow up with this patient?: No  Instructions For The Office: N/A   Symptoms  Reason For Call & Symptoms: Pt has been having trouble with neck pain.  It hurts in his shoulders and arms and SX started 08/11/13.  He was supposed to have had surgery  Reviewed Health History In EMR: Yes  Reviewed Medications In EMR: Yes  Reviewed Allergies In EMR: Yes  Reviewed Surgeries / Procedures: Yes  Date of Onset of Symptoms: 08/11/2013  Treatments Tried: Heating pad and muscle relaxing cream.  Treatments Tried Worked: No  Guideline(s) Used:  Neck Pain or Stiffness  Disposition Per Guideline:   See Within 3 Days in Office  Reason For Disposition Reached:   Moderate neck pain (e.g., interferes with normal activities like work or school)  Advice Given:  Call Back If:  Numbness or weakness occurs  Bowel or bladder problems occur  You become worse.  Patient Will Follow Care Advice:  YES  Appointment Scheduled:  08/16/2013 09:45:00 Appointment Scheduled Provider:  Hannah Beat [facility])

## 2013-08-16 ENCOUNTER — Ambulatory Visit (INDEPENDENT_AMBULATORY_CARE_PROVIDER_SITE_OTHER): Payer: Medicare Other | Admitting: Family Medicine

## 2013-08-16 ENCOUNTER — Ambulatory Visit (INDEPENDENT_AMBULATORY_CARE_PROVIDER_SITE_OTHER)
Admission: RE | Admit: 2013-08-16 | Discharge: 2013-08-16 | Disposition: A | Discharge status: Home / Self Care | Payer: Medicare Other | Source: Ambulatory Visit | Attending: Family Medicine | Admitting: Family Medicine

## 2013-08-16 ENCOUNTER — Encounter: Payer: Self-pay | Admitting: Family Medicine

## 2013-08-16 ENCOUNTER — Other Ambulatory Visit: Payer: Self-pay | Admitting: Family Medicine

## 2013-08-16 VITALS — BP 132/78 | HR 67 | Temp 97.7°F | Ht 71.5 in | Wt 221.8 lb

## 2013-08-16 DIAGNOSIS — M542 Cervicalgia: Secondary | ICD-10-CM

## 2013-08-16 DIAGNOSIS — M5412 Radiculopathy, cervical region: Secondary | ICD-10-CM

## 2013-08-16 DIAGNOSIS — M5010 Cervical disc disorder with radiculopathy, unspecified cervical region: Secondary | ICD-10-CM

## 2013-08-16 MED ORDER — PREDNISONE 20 MG PO TABS: ORAL_TABLET | ORAL | Status: DC

## 2013-08-16 NOTE — Telephone Encounter (Signed)
Received refill request electronically. Last office visit 08/16/13/acute visit. Request does not match medication sheet which shows 30 units daily. Please verify.

## 2013-08-16 NOTE — Telephone Encounter (Signed)
Sent!

## 2013-08-16 NOTE — Progress Notes (Signed)
Pre-visit discussion using our clinic review tool. No additional management support is needed unless otherwise documented below in the visit note.  

## 2013-08-16 NOTE — Patient Instructions (Signed)
REFERRAL: GO THE THE FRONT ROOM AT THE ENTRANCE OF OUR CLINIC, NEAR CHECK IN. ASK FOR Tony Manning. SHE WILL HELP YOU SET UP YOUR REFERRAL. DATE: TIME:  

## 2013-08-16 NOTE — Progress Notes (Signed)
Date:  08/16/2013   Name:  Tony Manning   DOB:  1961/01/19   MRN:  [id number] Gender: male Age: 52 y.o.  Primary Physician:  Crawford Givens, MD   Chief Complaint: Neck Pain   Subjective:   History of Present Illness:  Tony Manning is a 52 y.o. pleasant patient who presents with the following:  Neck is bothering him a lot over the past few months, but baseline has been problems for years.  2 levels s/p fusion by Dr. Otelia Sergeant. c5-6, c6-7 fusion. In 2011, he saw Dr. Dutch Quint in consultation with significant neurological changes and disk herniation, and additional operative intervention was planned - there was some question regarding lucency around his hardware.   He called Dr. Dutch Quint, and he did not get a call back.  Right now it will come and go.  Bilateral, L > R radiculopathy. Some hand weakness.  S/p nerve releases B arms, anterior  Patient Active Problem List   Diagnosis Date Noted  . Benign paroxysmal positional vertigo 07/13/2013  . Orthostasis 07/13/2013  . Migraine, unspecified, without mention of intractable migraine without mention of status migrainosus 04/03/2013  . Medicare annual wellness visit, initial 02/17/2013  . Pain in joint, ankle and foot 10/19/2012  . Irritable mood 01/18/2012  . Cough 12/21/2011  . Tobacco abuse 12/21/2011  . Skin lesion of face 04/27/2011  . Neck pain 12/03/2010  . Hearing loss 12/03/2010  . DIABETES MELLITUS, WITH NEUROLOGICAL COMPLICATIONS 10/20/2010  . HYPERCHOLESTEROLEMIA 10/20/2010  . DEPRESSION 10/20/2010  . OSTEOARTHRITIS 10/20/2010  . ARTHRITIS 10/20/2010  . NEPHROLITHIASIS, HX OF 10/20/2010  . CHICKENPOX, HX OF 10/20/2010  . ESSENTIAL HYPERTENSION, BENIGN 09/30/2010  . CORONARY ATHEROSCLEROSIS NATIVE CORONARY ARTERY 09/30/2010  . SLEEP APNEA 09/30/2010  . CHEST PAIN UNSPECIFIED 09/30/2010  . PERCUTANEOUS TRANSLUMINAL CORONARY ANGIOPLASTY, HX OF 09/30/2010    Past Medical History  Diagnosis Date  .  Essential hypertension, benign   . Postsurgical percutaneous transluminal coronary angioplasty status   . Coronary atherosclerosis of native coronary artery   . Chest pain, unspecified   . Diabetes mellitus without mention of complication   . Unspecified sleep apnea   . Depression   . History of nephrolithiasis   . Migraine   . Osteoarthritis   . Basal cell carcinoma of face     Past Surgical History  Procedure Laterality Date  . Knee surgery      Bilateral   . Cervical fusion  2009  . Lung biopsy      sarcoid  . Rotator cuff repair      left  . Appendectomy  2005  . Carpal tunnel release    . Elbow surgery      bilateral    History   Social History  . Marital Status: Married    Spouse Name: N/A    Number of Children: 2  . Years of Education: N/A   Occupational History  . disabled    Social History Main Topics  . Smoking status: Never Smoker   . Smokeless tobacco: Current User     Comment: occas  . Alcohol Use: No  . Drug Use: No  . Sexual Activity: Not on file   Other Topics Concern  . Not on file   Social History Narrative   Married, 1987   2 kids   On disability    Family History  Problem Relation Age of Onset  . Arthritis Mother   . Depression Mother   . Stroke  Mother   . Arthritis Father   . Diabetes Father   . Coronary artery disease Father   . Colon cancer Neg Hx   . Prostate cancer Neg Hx   . Esophageal cancer Neg Hx   . Rectal cancer Neg Hx   . Stomach cancer Neg Hx     Allergies  Allergen Reactions  . Gabapentin Other (See Comments)    REACTION: lack of effect for pain  . Lisinopril Cough    Medication list has been reviewed and updated.  Review of Systems:  GEN: No fevers, chills. Nontoxic. Primarily MSK c/o today. MSK: Detailed in the HPI GI: tolerating PO intake without difficulty Neuro: as above Otherwise the pertinent positives of the ROS are noted above.   Objective:   Physical Examination: BP 132/78  Pulse 67   Temp(Src) 97.7 F (36.5 C) (Oral)  Ht 5' 11.5" (1.816 m)  Wt 221 lb 12.8 oz (100.608 kg)  BMI 30.51 kg/m2  SpO2 97%  Ideal Body Weight: Weight in (lb) to have BMI = 25: 181.4   GEN: Well-developed,well-nourished,in no acute distress; alert,appropriate and cooperative throughout examination HEENT: Normocephalic and atraumatic without obvious abnormalities. Ears, externally no deformities PULM: Breathing comfortably in no respiratory distress EXT: No clubbing, cyanosis, or edema PSYCH: Normally interactive. Cooperative during the interview. Pleasant. Friendly and conversant. Not anxious or depressed appearing. Normal, full affect.  CERVICAL SPINE EXAM Range of motion: Flexion, extension, lateral bending, and rotation: minimal ability to flex, extension limited, > 50% loss of motion, >50% loss of motion with lateral bending and rotation Pain with terminal motion: yes Spinous Processes: NT SCM: NT Upper paracervical muscles: ttp Upper traps: mildly tender  Light touch sensation diminished diffusely Pinprick "causes tingling"  Grip str notably diminished B  Dg Cervical Spine Complete  08/16/2013   CLINICAL DATA:  Status post fusion with neck pain  EXAM: CERVICAL SPINE  4+ VIEWS  COMPARISON:  03/31/2011  FINDINGS: Postsurgical changes are noted at C5-6 and C6-7. The overall appearance is similar to that seen on the prior exam. Mild osteophytic changes are noted at C4-5 which have increased slightly in the interval from the prior study. The neural foramina appear patent. No odontoid abnormality is seen. No soft tissue changes are noted.  IMPRESSION: Postoperative change stable from the prior exam. Slight increase in osteophytic change at C4-5.   Electronically Signed   By: Alcide Clever M.D.   On: 08/16/2013 12:06    Assessment & Plan:    Cervical disc disorder with radiculopathy of cervical region - Plan: DG Cervical Spine Complete, Ambulatory referral to Neurosurgery  Neck pain -  Plan: DG Cervical Spine Complete, Ambulatory referral to Neurosurgery  DM, on insulin.  Neurological changes, suspect Bilateral nerve involvement vs cord involvement. Steroids, low dose.  Consult Dr. Dutch Quint  Patient Instructions  REFERRAL: GO THE THE FRONT ROOM AT THE ENTRANCE OF OUR CLINIC, NEAR CHECK IN. ASK FOR MARION. SHE WILL HELP YOU SET UP YOUR REFERRAL. DATE: TIME:    Orders Today:  Orders Placed This Encounter  Procedures  . DG Cervical Spine Complete  . Ambulatory referral to Neurosurgery    New medications, updates to list, dose adjustments: Meds ordered this encounter  Medications  . predniSONE (DELTASONE) 20 MG tablet    Sig: 1 po daily for 1 week    Dispense:  7 tablet    Refill:  0    Signed,  Cynthea Zachman T. Aryeh Butterfield, MD, CAQ Sports Medicine  [employer] at  [facility] [address] [place] [address] Phone: [phone] Fax: [phone]  Updated Complete Medication List:   Medication List       This list is accurate as of: 08/16/13  6:43 PM.  Always use your most recent med list.               amitriptyline 25 MG tablet  Commonly known as:  ELAVIL  Take 25 mg by mouth at bedtime.     aspirin 81 MG tablet  Take 81 mg by mouth 2 (two) times daily.     carvedilol 3.125 MG tablet  Commonly known as:  COREG  Take 1 tablet (3.125 mg total) by mouth 2 (two) times daily with a meal.     glimepiride 4 MG tablet  Commonly known as:  AMARYL  TAKE ONE (1) TABLET BY MOUTH TWO (2) TIMES DAILY  NEED APPOINTMENT FOR REFILLS     insulin aspart 100 UNIT/ML Sopn FlexPen  Commonly known as:  novoLOG  4 units with a meal if sugar is 140 or higher before the meal     insulin glargine 100 UNIT/ML injection  Commonly known as:  LANTUS  Inject 30 Units into the skin daily.     Insulin Glargine 100 UNIT/ML Sopn  Commonly known as:  LANTUS SOLOSTAR  30-35 UNITS SUBCUTANEOUSLY DAILY     Insulin Pen Needle 31G X 5 MM Misc  Use as directed       losartan 25 MG tablet  Commonly known as:  COZAAR  Take 1 tablet (25 mg total) by mouth daily.     metFORMIN 1000 MG tablet  Commonly known as:  GLUCOPHAGE  Take 1 tablet (1,000 mg total) by mouth 2 (two) times daily with a meal.     nitroGLYCERIN 0.4 MG SL tablet  Commonly known as:  NITROSTAT  Place 1 tablet (0.4 mg total) under the tongue every 5 (five) minutes as needed for chest pain.     pravastatin 40 MG tablet  Commonly known as:  PRAVACHOL  Take 1 tablet (40 mg total) by mouth daily.     predniSONE 20 MG tablet  Commonly known as:  DELTASONE  1 po daily for 1 week     traMADol 50 MG tablet  Commonly known as:  ULTRAM  Take 1 tablet (50 mg total) by mouth every 6 (six) hours as needed.

## 2013-10-06 ENCOUNTER — Institutional Professional Consult (permissible substitution): Payer: Medicare Other | Admitting: Pulmonary Disease

## 2013-10-16 ENCOUNTER — Ambulatory Visit: Payer: Medicare Other | Admitting: Family Medicine

## 2013-10-23 LAB — HM DIABETES EYE EXAM

## 2013-11-09 ENCOUNTER — Encounter: Payer: Self-pay | Admitting: Family Medicine

## 2013-11-09 ENCOUNTER — Ambulatory Visit (INDEPENDENT_AMBULATORY_CARE_PROVIDER_SITE_OTHER): Payer: Medicare Other | Admitting: Family Medicine

## 2013-11-09 VITALS — BP 122/70 | HR 67 | Temp 98.0°F | Wt 218.8 lb

## 2013-11-09 DIAGNOSIS — E1149 Type 2 diabetes mellitus with other diabetic neurological complication: Secondary | ICD-10-CM

## 2013-11-09 DIAGNOSIS — E119 Type 2 diabetes mellitus without complications: Secondary | ICD-10-CM

## 2013-11-09 DIAGNOSIS — Z8679 Personal history of other diseases of the circulatory system: Secondary | ICD-10-CM

## 2013-11-09 DIAGNOSIS — I2510 Atherosclerotic heart disease of native coronary artery without angina pectoris: Secondary | ICD-10-CM

## 2013-11-09 LAB — COMPREHENSIVE METABOLIC PANEL
ALT: 32 U/L (ref 0–53)
AST: 25 U/L (ref 0–37)
Albumin: 4 g/dL (ref 3.5–5.2)
Alkaline Phosphatase: 60 U/L (ref 39–117)
BUN: 17 mg/dL (ref 6–23)
CO2: 28 mEq/L (ref 19–32)
Calcium: 9.4 mg/dL (ref 8.4–10.5)
Chloride: 104 mEq/L (ref 96–112)
Creatinine, Ser: 1 mg/dL (ref 0.4–1.5)
GFR: 84.82 mL/min (ref >60.00)
Glucose, Bld: 146 mg/dL — ABNORMAL HIGH (ref 70–99)
Potassium: 4.2 mEq/L (ref 3.5–5.1)
Sodium: 138 mEq/L (ref 135–145)
Total Bilirubin: 0.9 mg/dL (ref 0.3–1.2)
Total Protein: 7 g/dL (ref 6.0–8.3)

## 2013-11-09 LAB — LIPID PANEL
Cholesterol: 160 mg/dL (ref 0–200)
HDL: 40.9 mg/dL (ref >39.00)
LDL Cholesterol: 91 mg/dL (ref 0–99)
Total CHOL/HDL Ratio: 4
Triglycerides: 140 mg/dL (ref 0.0–149.0)
VLDL: 28 mg/dL (ref 0.0–40.0)

## 2013-11-09 LAB — HEMOGLOBIN A1C: Hgb A1c MFr Bld: 7.6 % — ABNORMAL HIGH (ref 4.6–6.5)

## 2013-11-09 MED ORDER — INSULIN DETEMIR 100 UNIT/ML FLEXPEN: 30.0000 [IU] | PEN_INJECTOR | Freq: Every day | SUBCUTANEOUS | Status: DC

## 2013-11-09 NOTE — Patient Instructions (Addendum)
Go to the lab on the way out.  We'll contact you with your lab report. Rosaria Ferries will call about your referral. Burnis Medin go from there.   Take care.

## 2013-11-09 NOTE — Progress Notes (Signed)
Pre visit review using our clinic review tool, if applicable. No additional management support is needed unless otherwise documented below in the visit note.  Taking tylenol and aleve for neck pain.  He can't take tramadol due to GI upset.   He can't f/u with cards now, was dropped per his report.  He needs a new referral.  No chest pain currently.   Diabetes:  Using medications without difficulties: yes, but needs to change to levemir due to cost.   Hypoglycemic episodes:not checked, needs a new meter, none recently in the last month Hyperglycemic episodes: no sx Feet problems:at baseline Blood Sugars averaging: not checked Due for labs.    Meds, vitals, and allergies reviewed.   ROS: See HPI.  Otherwise negative.    GEN: nad, alert and oriented HEENT: mucous membranes moist NECK: supple w/o LA CV: rrr. PULM: ctab, no inc wob ABD: soft, +bs EXT: no edema SKIN: no acute rash  Diabetic foot exam: Normal inspection No skin breakdown No calluses  Normal DP pulses dec sensation to light touch and monofilament Nails normal

## 2013-11-10 ENCOUNTER — Telehealth: Payer: Self-pay

## 2013-11-10 ENCOUNTER — Encounter: Payer: Self-pay | Admitting: Family Medicine

## 2013-11-10 NOTE — Assessment & Plan Note (Signed)
Refer to cards.  No CP currently.

## 2013-11-10 NOTE — Telephone Encounter (Signed)
Relevant patient education assigned to patient using Emmi. ° °

## 2013-11-10 NOTE — Assessment & Plan Note (Signed)
Change to levemir, rx written for meter. He'll start back checking his sugar and adjust levemir as needed. Recheck in 6 months.

## 2013-11-27 ENCOUNTER — Institutional Professional Consult (permissible substitution): Payer: Medicare Other | Admitting: Pulmonary Disease

## 2013-12-11 ENCOUNTER — Ambulatory Visit: Payer: Medicare Other | Admitting: Cardiovascular Disease

## 2013-12-27 ENCOUNTER — Other Ambulatory Visit: Payer: Self-pay | Admitting: Family Medicine

## 2013-12-28 ENCOUNTER — Ambulatory Visit (INDEPENDENT_AMBULATORY_CARE_PROVIDER_SITE_OTHER): Payer: Medicare Other | Admitting: Family Medicine

## 2013-12-28 ENCOUNTER — Encounter: Payer: Self-pay | Admitting: Family Medicine

## 2013-12-28 VITALS — BP 142/80 | HR 78 | Temp 98.1°F | Wt 214.8 lb

## 2013-12-28 DIAGNOSIS — G4730 Sleep apnea, unspecified: Secondary | ICD-10-CM

## 2013-12-28 NOTE — Patient Instructions (Addendum)
Let me know what replacement mask you need and we'll go from there.   Schedule a follow up appointment with pulmonary.   We'll go from there.   If the diarrhea isn't better, then cut back your metfomin.   Take care.

## 2013-12-28 NOTE — Progress Notes (Signed)
Pre visit review using our clinic review tool, if applicable. No additional management support is needed unless otherwise documented below in the visit note.  Fatigue for the last 2 weeks.  Sugar this AM was 163.  No fevers.  Some diarrhea recently, some more since Tuesday.  No clear source, other than the metformin. No blood in stool.  No vomiting.  No rash.  No cough.  Sleeping d/w pt- wife snores, some nights he sleeps better than others. Mouth getting dry from CPAP. Sometimes have a air leak, dry mouth and he needs a new mask.  Due to see pulmonary in the near future.  He has one other mask he can try in the meantime.  Waking up tired.    Meds, vitals, and allergies reviewed.   ROS: See HPI.  Otherwise, noncontributory.  GEN: nad, alert and oriented HEENT: mucous membranes moist NECK: supple w/o LA CV: rrr.  PULM: ctab, no inc wob ABD: soft, +bs EXT: no edema SKIN: no acute rash

## 2013-12-29 NOTE — Assessment & Plan Note (Signed)
I'll write for his mask if needed.  He'll check on pulmonary f/u.  This is the most likely cause of his fatigue.  He agrees with plan.

## 2014-02-12 ENCOUNTER — Telehealth: Payer: Self-pay

## 2014-02-12 ENCOUNTER — Ambulatory Visit: Payer: Medicare Other | Admitting: Cardiovascular Disease

## 2014-02-12 MED ORDER — CYCLOBENZAPRINE HCL 10 MG PO TABS: 5.0000 mg | ORAL_TABLET | Freq: Three times a day (TID) | ORAL | Status: DC | PRN

## 2014-02-12 NOTE — Telephone Encounter (Signed)
Pt has sharp lower back pain upon movement otherwise dull pain when pt is still; pt said muscular pain; pt has had pain like this before; hurts worse upon movement and sometimes pain goes down rt leg.pt said pain started on 02/02/14 and has gradually worsened; pt's mother slid out of her w/c and pt had to lift her back into chair. Difficult for pt to get out of bed and get out of chair. Pain level is 7. No UTI symptoms.[place]. Pt does not have transportation to come to office. Pt request cb.

## 2014-02-12 NOTE — Telephone Encounter (Signed)
Patient advised.

## 2014-02-12 NOTE — Telephone Encounter (Signed)
Would take flexeril with sedation caution in the meantime, f/u prn.  Use heat and ice, gentle stretching.  rx sent.  Thanks.

## 2014-03-19 ENCOUNTER — Telehealth: Payer: Self-pay | Admitting: Family Medicine

## 2014-03-19 NOTE — Telephone Encounter (Signed)
Diabetic Bundle.  Pt needs follow up appt to check BP.  Left vm for pt to return call.

## 2014-03-22 ENCOUNTER — Other Ambulatory Visit: Payer: Self-pay | Admitting: Family Medicine

## 2014-05-16 ENCOUNTER — Telehealth: Payer: Self-pay | Admitting: Family Medicine

## 2014-05-16 DIAGNOSIS — E119 Type 2 diabetes mellitus without complications: Secondary | ICD-10-CM

## 2014-05-16 NOTE — Telephone Encounter (Signed)
Yes.  A1c ahead of time, order is in.  Doesn't need to fast.  Thanks.

## 2014-05-16 NOTE — Addendum Note (Signed)
Addended by: Tonia Ghent on: 05/16/2014 01:51 PM   Modules accepted: Orders

## 2014-05-16 NOTE — Telephone Encounter (Signed)
Pt advised.  Lab appt scheduled for tomorrow.

## 2014-05-16 NOTE — Telephone Encounter (Signed)
Patient has an appointment on 05/22/14 for a diabetic follow up.  Patient wants to know if you want him to come in before the appointment for lab work.

## 2014-05-17 ENCOUNTER — Other Ambulatory Visit (INDEPENDENT_AMBULATORY_CARE_PROVIDER_SITE_OTHER): Payer: Medicare Other

## 2014-05-17 DIAGNOSIS — E119 Type 2 diabetes mellitus without complications: Secondary | ICD-10-CM

## 2014-05-17 LAB — HEMOGLOBIN A1C: Hgb A1c MFr Bld: 7.6 % — ABNORMAL HIGH (ref 4.6–6.5)

## 2014-05-22 ENCOUNTER — Encounter: Payer: Self-pay | Admitting: Family Medicine

## 2014-05-22 ENCOUNTER — Ambulatory Visit (INDEPENDENT_AMBULATORY_CARE_PROVIDER_SITE_OTHER): Payer: Medicare Other | Admitting: Family Medicine

## 2014-05-22 VITALS — BP 134/82 | HR 69 | Temp 97.5°F | Wt 212.8 lb

## 2014-05-22 DIAGNOSIS — Z23 Encounter for immunization: Secondary | ICD-10-CM

## 2014-05-22 DIAGNOSIS — E1149 Type 2 diabetes mellitus with other diabetic neurological complication: Secondary | ICD-10-CM

## 2014-05-22 DIAGNOSIS — R413 Other amnesia: Secondary | ICD-10-CM

## 2014-05-22 DIAGNOSIS — Z87442 Personal history of urinary calculi: Secondary | ICD-10-CM

## 2014-05-22 MED ORDER — HYDROCODONE-ACETAMINOPHEN 5-325 MG PO TABS: 1.0000 | ORAL_TABLET | Freq: Four times a day (QID) | ORAL | Status: DC | PRN

## 2014-05-22 MED ORDER — TAMSULOSIN HCL 0.4 MG PO CAPS: 0.4000 mg | ORAL_CAPSULE | Freq: Every day | ORAL | Status: DC

## 2014-05-22 NOTE — Patient Instructions (Addendum)
Try not to skip meals.  Try to work on gradual weight loss in the meantime.   Don't change your meds. Recheck labs before a physical in about 6 months.  If you don't pass the stone or have a fever, then notify me.  Try the hydrocodone with the flomax. If you note more memory changes, then call me.  Take care.  Glad to see you.

## 2014-05-22 NOTE — Progress Notes (Signed)
Pre visit review using our clinic review tool, if applicable. No additional management support is needed unless otherwise documented below in the visit note.  Diabetes:  Using medications without difficulties:yes.  Had to dec metformin due to GI sx, improved now Hypoglycemic episodes:rare sx, if prolonged fasting Hyperglycemic episodes: not now Feet problems: no tingling in the feet now.  Blood Sugars averaging: ~130 recently eye exam within last year: yes.  A1c stable at 7.6  D/w pt.   Working on diet, "doing better, eating better".  He is trying not to skip meals.  He is trying to maintain with healthy snacks.  Appetite is lower than prev, not every day, but on most days.  Has f/u with Dr. Einar Gip next week with cards.    Renal stones, has passed about 80 in the past.  H/o R flank pain, intermittent, over the last week.  The pain is migrating downward, per usual.  No fevers.    He has noted short term memory changes, but unclear if from attention lapses vs true memory loss.  Not getting lost.  Knows the year, month, day of the week.  3/3 on registration.  Can do math, read a watch, 3/3 on recall.   He knows details about his care plan, condition.  Recall appears to be normal today.   Meds, vitals, and allergies reviewed.   ROS: See HPI.  Otherwise negative.    GEN: nad, alert and oriented HEENT: mucous membranes moist NECK: supple w/o LA CV: rrr. PULM: ctab, no inc wob ABD: soft, +bs EXT: no edema SKIN: no acute rash See above re: mentation.   Diabetic foot exam: Normal inspection No skin breakdown No calluses  Normal DP pulses Dec sensation to light touch and monofilament Nails normal

## 2014-05-24 DIAGNOSIS — R413 Other amnesia: Secondary | ICD-10-CM | POA: Insufficient documentation

## 2014-05-24 NOTE — Assessment & Plan Note (Signed)
He'll work more on diet, needing more attention there than with med changes.  Labs d/w pt.  Diet d/w pt.  He agrees.

## 2014-05-24 NOTE — Assessment & Plan Note (Addendum)
Nontoxic, rx done for hydrocodone and flomax. Given his hx, would treat presumptively.  He'll notify me prn.  No fevers.  Nontoxic.  abd soft today.

## 2014-05-24 NOTE — Assessment & Plan Note (Signed)
He has noted short term memory changes, but unclear if from attention lapses vs true memory loss.  Not getting lost.  Knows the year, month, day of the week.  3/3 on registration.  Can do math, read a watch, 3/3 on recall.   He knows details about his care plan, condition.  Recall appears to be normal today.   Unclear if this is a true problem. We can follow this clinically for now.  He agrees.

## 2014-06-05 ENCOUNTER — Ambulatory Visit (INDEPENDENT_AMBULATORY_CARE_PROVIDER_SITE_OTHER): Payer: Medicare Other | Admitting: Family Medicine

## 2014-06-05 ENCOUNTER — Encounter: Payer: Self-pay | Admitting: Family Medicine

## 2014-06-05 VITALS — BP 130/80 | HR 64 | Temp 98.0°F | Wt 219.0 lb

## 2014-06-05 DIAGNOSIS — R0789 Other chest pain: Secondary | ICD-10-CM

## 2014-06-05 NOTE — Progress Notes (Signed)
Pre visit review using our clinic review tool, if applicable. No additional management support is needed unless otherwise documented below in the visit note.  L chest and L arm pain.  Had seen cards recently.  No CP now. Had been happening daily.  Has stress test scheduled for Monday with Dr. Einar Gip.  L of midline on chest, into the jaw and down the L arm.  When he has pain, it is always in the L elbow. Sometime the L arm pain is worse then the chest pain.  We talked about his ddx.  He had been doing some lifting and could have pulled a muscle.  When he has used NTG prev (intially) that has helped some.  Repeat NTG later on didn't help as much.  Sx can happen at rest, standing, moving around. Irregular onset.  Last episode was this AM,~6AM laying in bed.  Episodes usually last about 30 min.  He didn't know if this could be related to his neck.  He didn't have the follow up surgery from prev.  H/o B arm surgery for nerve impingement prev.  H/o L rotator cuff repair prev.   Meds, vitals, and allergies reviewed.   ROS: See HPI.  Otherwise, noncontributory.  nad ncat Neck supple. No LA rrr ctab Chest not ttp abd soft L shoulder with normal ROM and normal L elbow ROM.  L elbow not ttp and no sign of tennis elbow on testing.

## 2014-06-05 NOTE — Patient Instructions (Signed)
Get through the stress test.  Use the NTG as Drd. Ganji instructed.  Notify me if needed after that.  We may need to get a neck MRI at that point.  Take care.

## 2014-06-06 ENCOUNTER — Telehealth: Payer: Self-pay | Admitting: Family Medicine

## 2014-06-06 DIAGNOSIS — R0789 Other chest pain: Secondary | ICD-10-CM | POA: Insufficient documentation

## 2014-06-06 NOTE — Telephone Encounter (Signed)
emmi emailed °

## 2014-06-06 NOTE — Assessment & Plan Note (Signed)
He could have cardiac vs noncardiac source(s), ddx d/w pt.  No CP now.  Had NTG to use prn.  Would get through the stress test and if no cards intervention needed, then we'll need to consider the C spine as a possible source for some of his pain.  He agrees, he'll update me.  No pain at OV.

## 2014-06-12 ENCOUNTER — Telehealth: Payer: Self-pay | Admitting: Family Medicine

## 2014-06-12 NOTE — Telephone Encounter (Signed)
Pt dropped off rx for diabetic shoes and inserts to be filled out On lugene's desk

## 2014-06-12 NOTE — Telephone Encounter (Signed)
Form is on your desk.

## 2014-06-13 ENCOUNTER — Encounter (HOSPITAL_COMMUNITY): Payer: Self-pay | Admitting: *Deleted

## 2014-06-13 ENCOUNTER — Inpatient Hospital Stay (HOSPITAL_COMMUNITY)
Admission: AD | Admit: 2014-06-13 | Discharge: 2014-06-15 | DRG: 247 | Disposition: A | Discharge status: Home / Self Care | Payer: Medicare Other | Source: Ambulatory Visit | Attending: Cardiology | Admitting: Cardiology

## 2014-06-13 DIAGNOSIS — I214 Non-ST elevation (NSTEMI) myocardial infarction: Principal | ICD-10-CM

## 2014-06-13 DIAGNOSIS — E1165 Type 2 diabetes mellitus with hyperglycemia: Secondary | ICD-10-CM | POA: Diagnosis present

## 2014-06-13 DIAGNOSIS — Z85828 Personal history of other malignant neoplasm of skin: Secondary | ICD-10-CM

## 2014-06-13 DIAGNOSIS — Z955 Presence of coronary angioplasty implant and graft: Secondary | ICD-10-CM

## 2014-06-13 DIAGNOSIS — D869 Sarcoidosis, unspecified: Secondary | ICD-10-CM | POA: Diagnosis present

## 2014-06-13 DIAGNOSIS — E785 Hyperlipidemia, unspecified: Secondary | ICD-10-CM | POA: Diagnosis present

## 2014-06-13 DIAGNOSIS — Z79899 Other long term (current) drug therapy: Secondary | ICD-10-CM

## 2014-06-13 DIAGNOSIS — Z9861 Coronary angioplasty status: Secondary | ICD-10-CM

## 2014-06-13 DIAGNOSIS — Z8249 Family history of ischemic heart disease and other diseases of the circulatory system: Secondary | ICD-10-CM

## 2014-06-13 DIAGNOSIS — Z7982 Long term (current) use of aspirin: Secondary | ICD-10-CM

## 2014-06-13 DIAGNOSIS — Z6830 Body mass index (BMI) 30.0-30.9, adult: Secondary | ICD-10-CM

## 2014-06-13 DIAGNOSIS — I25110 Atherosclerotic heart disease of native coronary artery with unstable angina pectoris: Secondary | ICD-10-CM | POA: Diagnosis present

## 2014-06-13 DIAGNOSIS — E663 Overweight: Secondary | ICD-10-CM | POA: Diagnosis present

## 2014-06-13 DIAGNOSIS — F17220 Nicotine dependence, chewing tobacco, uncomplicated: Secondary | ICD-10-CM | POA: Diagnosis present

## 2014-06-13 DIAGNOSIS — G4730 Sleep apnea, unspecified: Secondary | ICD-10-CM | POA: Diagnosis present

## 2014-06-13 DIAGNOSIS — Z794 Long term (current) use of insulin: Secondary | ICD-10-CM

## 2014-06-13 DIAGNOSIS — I10 Essential (primary) hypertension: Secondary | ICD-10-CM | POA: Diagnosis present

## 2014-06-13 DIAGNOSIS — Z981 Arthrodesis status: Secondary | ICD-10-CM

## 2014-06-13 HISTORY — DX: Sarcoidosis, unspecified: D86.9

## 2014-06-13 HISTORY — DX: Cardiac murmur, unspecified: R01.1

## 2014-06-13 HISTORY — DX: Angina pectoris, unspecified: I20.9

## 2014-06-13 LAB — HEMOGLOBIN A1C
Hgb A1c MFr Bld: 7.1 % — ABNORMAL HIGH (ref <5.7)
Mean Plasma Glucose: 157 mg/dL — ABNORMAL HIGH (ref <117)

## 2014-06-13 LAB — COMPREHENSIVE METABOLIC PANEL
ALT: 29 U/L (ref 0–53)
AST: 35 U/L (ref 0–37)
Albumin: 4.1 g/dL (ref 3.5–5.2)
Alkaline Phosphatase: 64 U/L (ref 39–117)
Anion gap: 13 (ref 5–15)
BUN: 17 mg/dL (ref 6–23)
CO2: 23 mEq/L (ref 19–32)
Calcium: 9.6 mg/dL (ref 8.4–10.5)
Chloride: 102 mEq/L (ref 96–112)
Creatinine, Ser: 0.94 mg/dL (ref 0.50–1.35)
GFR calc Af Amer: >90 mL/min (ref >90)
GFR calc non Af Amer: >90 mL/min (ref >90)
Glucose, Bld: 102 mg/dL — ABNORMAL HIGH (ref 70–99)
Potassium: 4.2 mEq/L (ref 3.7–5.3)
Sodium: 138 mEq/L (ref 137–147)
Total Bilirubin: 0.5 mg/dL (ref 0.3–1.2)
Total Protein: 7.7 g/dL (ref 6.0–8.3)

## 2014-06-13 LAB — TROPONIN I
Troponin I: 2.68 ng/mL (ref <0.30)
Troponin I: 2.6 ng/mL (ref <0.30)

## 2014-06-13 LAB — GLUCOSE, CAPILLARY
Glucose-Capillary: 62 mg/dL — ABNORMAL LOW (ref 70–99)
Glucose-Capillary: 135 mg/dL — ABNORMAL HIGH (ref 70–99)
Glucose-Capillary: 195 mg/dL — ABNORMAL HIGH (ref 70–99)

## 2014-06-13 LAB — CBC
HCT: 43.4 % (ref 39.0–52.0)
Hemoglobin: 15 g/dL (ref 13.0–17.0)
MCH: 30.2 pg (ref 26.0–34.0)
MCHC: 34.6 g/dL (ref 30.0–36.0)
MCV: 87.5 fL (ref 78.0–100.0)
Platelets: 207 10*3/uL (ref 150–400)
RBC: 4.96 MIL/uL (ref 4.22–5.81)
RDW: 13.2 % (ref 11.5–15.5)
WBC: 9.2 10*3/uL (ref 4.0–10.5)

## 2014-06-13 LAB — TSH: TSH: 1.4 u[IU]/mL (ref 0.350–4.500)

## 2014-06-13 MED ORDER — SODIUM CHLORIDE 0.9 % IV SOLN
INTRAVENOUS | Status: DC
  Administered 2014-06-13: 17:00:00 via INTRAVENOUS

## 2014-06-13 MED ORDER — SODIUM CHLORIDE 0.9 % IV SOLN
250.0000 mL | INTRAVENOUS | Status: DC | PRN
  Administered 2014-06-13: 250 mL via INTRAVENOUS

## 2014-06-13 MED ORDER — HEPARIN BOLUS VIA INFUSION
4000.0000 [IU] | Freq: Once | INTRAVENOUS | Status: AC
Start: 2014-06-13 — End: 2014-06-13
  Administered 2014-06-13: 4000 [IU] via INTRAVENOUS
  Filled 2014-06-13: qty 4000

## 2014-06-13 MED ORDER — ACETAMINOPHEN 325 MG PO TABS
650.0000 mg | ORAL_TABLET | ORAL | Status: DC | PRN
  Administered 2014-06-14 – 2014-06-15 (×2): 650 mg via ORAL
  Filled 2014-06-13 (×2): qty 2

## 2014-06-13 MED ORDER — TICAGRELOR 90 MG PO TABS
90.0000 mg | ORAL_TABLET | Freq: Two times a day (BID) | ORAL | Status: DC
  Administered 2014-06-13 – 2014-06-15 (×3): 90 mg via ORAL
  Filled 2014-06-13 (×6): qty 1

## 2014-06-13 MED ORDER — ASPIRIN EC 81 MG PO TBEC
81.0000 mg | DELAYED_RELEASE_TABLET | Freq: Every day | ORAL | Status: DC
  Administered 2014-06-14 – 2014-06-15 (×2): 81 mg via ORAL
  Filled 2014-06-13 (×3): qty 1

## 2014-06-13 MED ORDER — SODIUM CHLORIDE 0.9 % IJ SOLN: 3.0000 mL | INTRAMUSCULAR | Status: DC | PRN

## 2014-06-13 MED ORDER — ATORVASTATIN CALCIUM 80 MG PO TABS
80.0000 mg | ORAL_TABLET | Freq: Every day | ORAL | Status: DC
  Administered 2014-06-14: 80 mg via ORAL
  Filled 2014-06-13 (×3): qty 1

## 2014-06-13 MED ORDER — SODIUM CHLORIDE 0.9 % IV SOLN: INTRAVENOUS | Status: DC

## 2014-06-13 MED ORDER — SODIUM CHLORIDE 0.9 % IJ SOLN
3.0000 mL | Freq: Two times a day (BID) | INTRAMUSCULAR | Status: DC
  Administered 2014-06-13: 3 mL via INTRAVENOUS

## 2014-06-13 MED ORDER — INSULIN ASPART 100 UNIT/ML ~~LOC~~ SOLN
0.0000 [IU] | Freq: Three times a day (TID) | SUBCUTANEOUS | Status: DC
  Administered 2014-06-14: 2 [IU] via SUBCUTANEOUS
  Administered 2014-06-15: 3 [IU] via SUBCUTANEOUS

## 2014-06-13 MED ORDER — CARVEDILOL 6.25 MG PO TABS
6.2500 mg | ORAL_TABLET | Freq: Two times a day (BID) | ORAL | Status: DC
  Administered 2014-06-13 – 2014-06-15 (×3): 6.25 mg via ORAL
  Filled 2014-06-13 (×6): qty 1

## 2014-06-13 MED ORDER — HEPARIN (PORCINE) IN NACL 100-0.45 UNIT/ML-% IJ SOLN
1300.0000 [IU]/h | INTRAMUSCULAR | Status: DC
  Administered 2014-06-13: 1300 [IU]/h via INTRAVENOUS
  Filled 2014-06-13 (×2): qty 250

## 2014-06-13 MED ORDER — ONDANSETRON HCL 4 MG/2ML IJ SOLN
4.0000 mg | Freq: Four times a day (QID) | INTRAMUSCULAR | Status: DC | PRN
Start: 2014-06-13 — End: 2014-06-15

## 2014-06-13 MED ORDER — NITROGLYCERIN 0.4 MG SL SUBL: 0.4000 mg | SUBLINGUAL_TABLET | SUBLINGUAL | Status: DC | PRN

## 2014-06-13 NOTE — Progress Notes (Signed)
Hypoglycemic Event  CBG: 62  Treatment: 15 GM carbohydrate snack  Symptoms: None  Follow-up CBG: 135    Time:1800  Possible Reasons for Event: Inadequate meal intake  Comments: patient asymptomatic    Tony Manning  Remember to initiate Hypoglycemia Order Set & complete

## 2014-06-13 NOTE — Progress Notes (Addendum)
ANTICOAGULATION CONSULT NOTE - Initial Consult  Pharmacy Consult for heparin Indication: chest pain/ACS  Allergies  Allergen Reactions  . Gabapentin Other (See Comments)    REACTION: lack of effect for pain  . Lisinopril Cough  . Tramadol     diarrhea    Patient Measurements: Height: 5\' 10"  (177.8 cm) Weight: 211 lb 3.2 oz (95.8 kg) IBW/kg (Calculated) : 73 Heparin Dosing Weight: 92kg  Vital Signs: Temp: 98.3 F (36.8 C) (10/07 1352) Temp Source: Oral (10/07 1352) BP: 161/93 mmHg (10/07 1352) Pulse Rate: 74 (10/07 1352)  Labs: No results found for this basename: HGB, HCT, PLT, APTT, LABPROT, INR, HEPARINUNFRC, CREATININE, CKTOTAL, CKMB, TROPONINI,  in the last 72 hours  Estimated Creatinine Clearance: 98.1 ml/min (by C-G formula based on Cr of 1).   Medical History: Past Medical History  Diagnosis Date  . Essential hypertension, benign   . Postsurgical percutaneous transluminal coronary angioplasty status   . Coronary atherosclerosis of native coronary artery   . Chest pain, unspecified   . Diabetes mellitus without mention of complication   . Unspecified sleep apnea   . Depression   . History of nephrolithiasis   . Migraine   . Osteoarthritis   . Basal cell carcinoma of face   . Anginal pain   . Heart murmur   . Sarcoidosis      Assessment: 53 yo male for r/o ACS and pharmacy asked to dose heparin.  Per RN patient is for cath at 4pm today  Goal of Therapy:  Heparin level 0.3-0.7 units/ml Monitor platelets by anticoagulation protocol: Yes   Plan:  -No heparin plans now since heparin will be off prior to cath (scheduled at 4pm) -Will follow plans post cath  Hildred Laser, Pharm D 06/13/2014 3:06 PM     =====================================   Addendum: - cath delayed until tomorrow; therefore, will start IV heparin - no baseline CBC so will order prior to initiating heparin   Plan: - STAT CBC, then start heparin (RN aware) - Heparin 4000  units IV bolus x 1, then - Heparin gtt at 1300 units/hr - Check 6 hr HL - Daily HL / CBC    Gay Moncivais D. Mina Marble, PharmD, BCPS Pager:  [phone] 06/13/2014, 5:40 PM

## 2014-06-13 NOTE — Telephone Encounter (Signed)
Form done, scan and send.  Thanks.

## 2014-06-13 NOTE — Progress Notes (Signed)
CRITICAL VALUE ALERT  Critical value received:  Troponin 2.68  Date of notification:  06/13/2014   Time of notification:  1945  Critical value read back:Yes.    Nurse who received alert:  Lyanne Co   MD notified (1st page):  Dr. Einar Gip  Time of first page:  2005  MD notified (2nd page):  Time of second page:  Responding MD:  Dr. Einar Gip  Time MD responded:  2013

## 2014-06-13 NOTE — Telephone Encounter (Signed)
Called to advise patient that form is completed. Was advised by patient's wife that he is at [facility] and is scheduled to have a heart cath tomorrow. Patient's wife stated that she will pick the form up later. Sent to be scanned into EMR.

## 2014-06-13 NOTE — H&P (Signed)
Tony Manning is an 53 y.o. male.   Chief Complaint: Chest pain HPI: The patient is a 53 year old male who presents for a recheck of CAD. Tony Manning is a 53 year old Caucasian male with history of uncontrolled diabetes, hypertension, hyperlipidemia who was had severe diffuse disease his coronaries, has 2 stents in the right coronary artery and one stent in the mid LAD. Last cardiac catheterization was in June 2008 revealing patent stents, and he has had a nuclear stress test in February 2012 which was normal.   He presents today for a stat appointment today with complaints of chest pain. Patient was scheduled to have a nuclear stress test following last appointment but did not show.   He reports that he developed severe chest pain last night and again this morning. Pain was moderate in severity last night, substernal in nature and radiated to the left shoulder and arm. He took nitro x 2 last night with little improvement, however he took asa and felt better.. Pain lasted for ~4-5 hours then resolved. Pain recurred this am and was 10/10 in severity. No associated nausea or vomiting; he does report a sense of "feeling hot" but no true diaphoresis. Patient took ASA x 3 and Nitro x 1 with relief in pain (appears to be more from the Aspirin than nitro). No associated SOB. Chest pain also worse when he layed down. No hemoptysis. No leg edema. No recent exacerbation/worsening of GERD.  Patient was seen in the office this morning and although he was recommended admission to the hospital, patient wanted to confirm that he is having myocardial infarction or unstable angina.  Hence a stat troponin was sent which was abnormal at 1.5.  Hence patient was advised to return back and go to the hospital for already angiography.  Past Medical History  Diagnosis Date  . Essential hypertension, benign   . Postsurgical percutaneous transluminal coronary angioplasty status   . Coronary  atherosclerosis of native coronary artery   . Chest pain, unspecified   . Diabetes mellitus without mention of complication   . Unspecified sleep apnea   . Depression   . History of nephrolithiasis   . Migraine   . Osteoarthritis   . Basal cell carcinoma of face   . Anginal pain   . Heart murmur   . Sarcoidosis     Past Surgical History  Procedure Laterality Date  . Knee surgery      Bilateral   . Cervical fusion  2009  . Lung biopsy      sarcoid  . Rotator cuff repair      left  . Appendectomy  2005  . Carpal tunnel release    . Elbow surgery      bilateral    Family History  Problem Relation Age of Onset  . Arthritis Mother   . Depression Mother   . Stroke Mother   . Arthritis Father   . Diabetes Father   . Coronary artery disease Father   . Colon cancer Neg Hx   . Prostate cancer Neg Hx   . Esophageal cancer Neg Hx   . Rectal cancer Neg Hx   . Stomach cancer Neg Hx    Social History:  reports that he has never smoked. His smokeless tobacco use includes Chew. He reports that he does not drink alcohol or use illicit drugs.  Allergies:  Allergies  Allergen Reactions  . Gabapentin Other (See Comments)    REACTION: lack of effect for  pain  . Lisinopril Cough  . Tramadol     diarrhea    Medications Prior to Admission  Medication Sig Dispense Refill  . aspirin 81 MG tablet Take 81 mg by mouth 2 (two) times daily.        . carvedilol (COREG) 3.125 MG tablet Take 1 tablet (3.125 mg total) by mouth 2 (two) times daily with a meal.  60 tablet  12  . glimepiride (AMARYL) 4 MG tablet TAKE ONE (1) TABLET BY MOUTH TWO (2) TIMES DAILY  NEED APPOINTMENT FOR REFILLS  60 tablet  11  . Insulin Detemir (LEVEMIR) 100 UNIT/ML Pen Inject 40 Units into the skin daily at 10 pm. levemir pen      . nitroGLYCERIN (NITROSTAT) 0.4 MG SL tablet Place 1 tablet (0.4 mg total) under the tongue every 5 (five) minutes as needed for chest pain.  25 tablet  3  . omeprazole (PRILOSEC) 40 MG  capsule Take 40 mg by mouth 2 (two) times daily.      . pravastatin (PRAVACHOL) 40 MG tablet Take 1 tablet (40 mg total) by mouth daily.  30 tablet  12  . cyclobenzaprine (FLEXERIL) 10 MG tablet Take 0.5-1 tablets (5-10 mg total) by mouth 3 (three) times daily as needed for muscle spasms.  20 tablet  0  . HYDROcodone-acetaminophen (NORCO/VICODIN) 5-325 MG per tablet Take 1 tablet by mouth every 6 (six) hours as needed for moderate pain (sedation caution).  40 tablet  0  . insulin aspart (NOVOLOG) 100 UNIT/ML SOPN FlexPen 4 units with a meal if sugar is 140 or higher before the meal      . Insulin Pen Needle 31G X 5 MM MISC Use as directed  300 each  12  . losartan (COZAAR) 25 MG tablet Take 1 tablet (25 mg total) by mouth daily.  30 tablet  12  . metFORMIN (GLUCOPHAGE) 1000 MG tablet TAKE 1 TABLET BY MOUTH ONCE A DAY WITH A MEAL      . ONE TOUCH ULTRA TEST test strip CHECK BLOOD GLUCOSE AS DIRECTED BY YOUR PHYSICIAN  50 each  3  . tamsulosin (FLOMAX) 0.4 MG CAPS capsule Take 1 capsule (0.4 mg total) by mouth daily.  30 capsule  3      Review of Systems - General:Not Present- Fatigue, Fever and Night Sweats. Skin:Not Present- Itching and Rash. HEENT:Not Present- Headache. Cardiovascular:Present- Chest Pain. Not Present- Claudications, Fainting, Orthopnea and Swelling of Extremities. Gastrointestinal:Present- Heartburn (chronic) and Nausea. Not Present- Abdominal Pain, Constipation, Diarrhea and Vomiting. Musculoskeletal:Not Present- Joint Swelling. Neurological:Not Present- Headaches. Hematology:Not Present- Blood Clots, Easy Bruising and Nose Bleed. Other systems negative  Blood pressure 161/93, pulse 74, temperature 98.3 F (36.8 C), temperature source Oral, height $RemoveBefo'5\' 10"'[id number]$  (1.778 m), weight 95.8 kg (211 lb 3.2 oz), SpO2 99.00%.   General: Well built and overweight body habitus who is in no acute distress. Appears stated age. Alert Ox3.   There is no cyanosis. HEENT: normal  limits. No JVD.   CARDIAC EXAM: S1, S2 normal, no gallop present. No murmur.   CHEST EXAM: Mild tenderness of left chest wall. LUNGS: Clear to percuss and auscultate.  ABDOMEN: No hepatosplenomegaly. BS normal in all 4 quadrants. Abdomen is non-tender.   EXTREMITY: Full range of movementes, No edema. MUSCULOSKELETAL EXAM: Intact with full range of motion in all 4 extremities.   NEUROLOGIC EXAM: Grossly intact without any focal deficits. Alert O x 3.   VASCULAR EXAM: No skin breakdown. Carotids normal. Extremities:  Femoral pulse normal. Popliteal pulse normal; Pedal pulse normal. Otherwise No prominent pulse felt in the abdomen. No varicose veins.  Results for orders placed during the hospital encounter of 06/13/14 (from the past 48 hour(s))  COMPREHENSIVE METABOLIC PANEL     Status: Abnormal   Collection Time    06/13/14  3:15 PM      Result Value Ref Range   Sodium 138  137 - 147 mEq/L   Potassium 4.2  3.7 - 5.3 mEq/L   Chloride 102  96 - 112 mEq/L   CO2 23  19 - 32 mEq/L   Glucose, Bld 102 (*) 70 - 99 mg/dL   BUN 17  6 - 23 mg/dL   Creatinine, Ser 0.94  0.50 - 1.35 mg/dL   Calcium 9.6  8.4 - 10.5 mg/dL   Total Protein 7.7  6.0 - 8.3 g/dL   Albumin 4.1  3.5 - 5.2 g/dL   AST 35  0 - 37 U/L   ALT 29  0 - 53 U/L   Alkaline Phosphatase 64  39 - 117 U/L   Total Bilirubin 0.5  0.3 - 1.2 mg/dL   GFR calc non Af Amer >90  >90 mL/min   GFR calc Af Amer >90  >90 mL/min   Comment: (NOTE)     The eGFR has been calculated using the CKD EPI equation.     This calculation has not been validated in all clinical situations.     eGFR's persistently <90 mL/min signify possible Chronic Kidney     Disease.   Anion gap 13  5 - 15  TSH     Status: None   Collection Time    06/13/14  3:15 PM      Result Value Ref Range   TSH 1.400  0.350 - 4.500 uIU/mL   No results found.  Labs:   Lab Results  Component Value Date   WBC 8.1 09/29/2010   HGB 17.7* 09/29/2010   HCT 52.0  09/29/2010   MCV 87.8 09/29/2010   PLT 217 09/29/2010    Recent Labs Lab 06/13/14 1515  NA 138  K 4.2  CL 102  CO2 23  BUN 17  CREATININE 0.94  CALCIUM 9.6  PROT 7.7  BILITOT 0.5  ALKPHOS 64  ALT 29  AST 35  GLUCOSE 102*   Lab Results  Component Value Date   CKTOTAL 72 09/29/2010   CKMB 1.3 09/29/2010   TROPONINI  Value: 0.02        NO INDICATION OF MYOCARDIAL INJURY. 09/29/2010    Lipid Panel     Component Value Date/Time   CHOL 160 11/09/2013 0920   TRIG 140.0 11/09/2013 0920   HDL 40.90 11/09/2013 0920   CHOLHDL 4 11/09/2013 0920   VLDL 28.0 11/09/2013 0920   LDLCALC 91 11/09/2013 0920    Office  EKG 06/13/2014: Normal sinus rhythm, normal axis. Inferior T wave inversion, new from prior EKG on 05/30/2014, suggestive of ischemia. Abnormal EKG.  EKG 05/30/2014: Normal sinus rhythm/sinus bradycardia at the rate of 55 bpm, normal intervals, no evidence of ischemia, normal EKG. No change from 03/16/2013  Assessment/Plan 1.  Non-ST elevation myocardial infarction involving the inferior wall. New EKG inferior T wave inversion. 2.  Coronary artery disease of the native vessels.  History of  CAD s/p 2.75x12 mm Taxus in mid LAD, 2.5x12 , Distal RCA stent x 2 in 2004 and 2007. Last Cardiac cath 6/08 patent stents. 3.  Diabetes mellitus type 2  uncontrolled. HbA1c 05/17/2014: 7.6%. 4.  Hyperlipidemia. 11/09/2013: Total cholesterol 160, triglycerides 140, HDL 40, LDL 91. CMP normal  Recommendation: The patient will be admitted to the hospital for further management of acute coronary syndrome.  Patient's wife present at the bedside, I have already discussed with him regarding proceeding with coronary angiography.  Patient understands less than 1% risk of death stroke, heart attack, need for urgent CABG, bleeding, infection but not limited disease with angiography and wanted to percent with PCI.   Laverda Page, MD 06/13/2014, 4:25 PM [facility] [place] Pager: [phone] Office:  [phone] If no answer: Cell:  [phone]

## 2014-06-14 ENCOUNTER — Encounter (HOSPITAL_COMMUNITY)
Admission: AD | Disposition: A | Discharge status: Home / Self Care | Payer: Medicare Other | Source: Ambulatory Visit | Attending: Cardiology

## 2014-06-14 DIAGNOSIS — Z79899 Other long term (current) drug therapy: Secondary | ICD-10-CM | POA: Diagnosis not present

## 2014-06-14 DIAGNOSIS — E1165 Type 2 diabetes mellitus with hyperglycemia: Secondary | ICD-10-CM | POA: Diagnosis not present

## 2014-06-14 DIAGNOSIS — R079 Chest pain, unspecified: Secondary | ICD-10-CM | POA: Diagnosis present

## 2014-06-14 DIAGNOSIS — Z7982 Long term (current) use of aspirin: Secondary | ICD-10-CM | POA: Diagnosis not present

## 2014-06-14 DIAGNOSIS — E663 Overweight: Secondary | ICD-10-CM | POA: Diagnosis present

## 2014-06-14 DIAGNOSIS — Z794 Long term (current) use of insulin: Secondary | ICD-10-CM | POA: Diagnosis not present

## 2014-06-14 DIAGNOSIS — I214 Non-ST elevation (NSTEMI) myocardial infarction: Secondary | ICD-10-CM | POA: Diagnosis not present

## 2014-06-14 DIAGNOSIS — Z9861 Coronary angioplasty status: Secondary | ICD-10-CM | POA: Diagnosis not present

## 2014-06-14 DIAGNOSIS — I10 Essential (primary) hypertension: Secondary | ICD-10-CM | POA: Diagnosis not present

## 2014-06-14 DIAGNOSIS — F17220 Nicotine dependence, chewing tobacco, uncomplicated: Secondary | ICD-10-CM | POA: Diagnosis not present

## 2014-06-14 DIAGNOSIS — G4730 Sleep apnea, unspecified: Secondary | ICD-10-CM | POA: Diagnosis present

## 2014-06-14 DIAGNOSIS — Z85828 Personal history of other malignant neoplasm of skin: Secondary | ICD-10-CM | POA: Diagnosis not present

## 2014-06-14 DIAGNOSIS — I25110 Atherosclerotic heart disease of native coronary artery with unstable angina pectoris: Secondary | ICD-10-CM | POA: Diagnosis present

## 2014-06-14 DIAGNOSIS — D869 Sarcoidosis, unspecified: Secondary | ICD-10-CM | POA: Diagnosis present

## 2014-06-14 DIAGNOSIS — Z981 Arthrodesis status: Secondary | ICD-10-CM | POA: Diagnosis not present

## 2014-06-14 DIAGNOSIS — Z6830 Body mass index (BMI) 30.0-30.9, adult: Secondary | ICD-10-CM | POA: Diagnosis not present

## 2014-06-14 DIAGNOSIS — Z8249 Family history of ischemic heart disease and other diseases of the circulatory system: Secondary | ICD-10-CM | POA: Diagnosis not present

## 2014-06-14 DIAGNOSIS — E785 Hyperlipidemia, unspecified: Secondary | ICD-10-CM | POA: Diagnosis present

## 2014-06-14 HISTORY — PX: LEFT HEART CATHETERIZATION WITH CORONARY ANGIOGRAM: SHX5451

## 2014-06-14 LAB — PROTIME-INR
INR: 1.11 (ref 0.00–1.49)
Prothrombin Time: 14.3 seconds (ref 11.6–15.2)

## 2014-06-14 LAB — CBC
HCT: 42 % (ref 39.0–52.0)
Hemoglobin: 14.6 g/dL (ref 13.0–17.0)
MCH: 30.2 pg (ref 26.0–34.0)
MCHC: 34.8 g/dL (ref 30.0–36.0)
MCV: 87 fL (ref 78.0–100.0)
Platelets: 193 10*3/uL (ref 150–400)
RBC: 4.83 MIL/uL (ref 4.22–5.81)
RDW: 13 % (ref 11.5–15.5)
WBC: 8.3 10*3/uL (ref 4.0–10.5)

## 2014-06-14 LAB — POCT ACTIVATED CLOTTING TIME
Activated Clotting Time: 242 seconds
Activated Clotting Time: 546 seconds

## 2014-06-14 LAB — HEPARIN LEVEL (UNFRACTIONATED): Heparin Unfractionated: 0.46 IU/mL (ref 0.30–0.70)

## 2014-06-14 LAB — GLUCOSE, CAPILLARY
Glucose-Capillary: 169 mg/dL — ABNORMAL HIGH (ref 70–99)
Glucose-Capillary: 133 mg/dL — ABNORMAL HIGH (ref 70–99)
Glucose-Capillary: 150 mg/dL — ABNORMAL HIGH (ref 70–99)

## 2014-06-14 LAB — TROPONIN I: Troponin I: 2.17 ng/mL (ref <0.30)

## 2014-06-14 SURGERY — LEFT HEART CATHETERIZATION WITH CORONARY ANGIOGRAM
Anesthesia: LOCAL

## 2014-06-14 MED ORDER — LIDOCAINE HCL (PF) 1 % IJ SOLN
INTRAMUSCULAR | Status: AC
  Filled 2014-06-14: qty 30

## 2014-06-14 MED ORDER — ASPIRIN 81 MG PO CHEW
CHEWABLE_TABLET | ORAL | Status: AC
  Filled 2014-06-14: qty 1

## 2014-06-14 MED ORDER — HEPARIN (PORCINE) IN NACL 2-0.9 UNIT/ML-% IJ SOLN
INTRAMUSCULAR | Status: AC
  Filled 2014-06-14: qty 1500

## 2014-06-14 MED ORDER — GLIMEPIRIDE 4 MG PO TABS
4.0000 mg | ORAL_TABLET | Freq: Two times a day (BID) | ORAL | Status: DC
Start: 2014-06-14 — End: 2014-06-15
  Administered 2014-06-14 – 2014-06-15 (×2): 4 mg via ORAL
  Filled 2014-06-14 (×4): qty 1

## 2014-06-14 MED ORDER — LIVING WELL WITH DIABETES BOOK
Freq: Once | Status: AC
  Administered 2014-06-14: 12:00:00
  Filled 2014-06-14: qty 1

## 2014-06-14 MED ORDER — INSULIN DETEMIR 100 UNIT/ML ~~LOC~~ SOLN
40.0000 [IU] | Freq: Every day | SUBCUTANEOUS | Status: DC
  Administered 2014-06-14 – 2014-06-15 (×2): 40 [IU] via SUBCUTANEOUS
  Filled 2014-06-14 (×2): qty 0.4

## 2014-06-14 MED ORDER — INSULIN ASPART 100 UNIT/ML ~~LOC~~ SOLN: 4.0000 [IU] | SUBCUTANEOUS | Status: DC | PRN

## 2014-06-14 MED ORDER — TAMSULOSIN HCL 0.4 MG PO CAPS
0.4000 mg | ORAL_CAPSULE | ORAL | Status: DC | PRN
  Filled 2014-06-14: qty 1

## 2014-06-14 MED ORDER — MIDAZOLAM HCL 2 MG/2ML IJ SOLN
INTRAMUSCULAR | Status: AC
  Filled 2014-06-14: qty 2

## 2014-06-14 MED ORDER — TICAGRELOR 90 MG PO TABS
ORAL_TABLET | ORAL | Status: AC
  Filled 2014-06-14: qty 1

## 2014-06-14 MED ORDER — PANTOPRAZOLE SODIUM 40 MG PO TBEC
40.0000 mg | DELAYED_RELEASE_TABLET | Freq: Every day | ORAL | Status: DC
  Administered 2014-06-14 – 2014-06-15 (×2): 40 mg via ORAL
  Filled 2014-06-14 (×2): qty 1

## 2014-06-14 MED ORDER — NITROGLYCERIN 1 MG/10 ML FOR IR/CATH LAB
INTRA_ARTERIAL | Status: AC
  Filled 2014-06-14: qty 10

## 2014-06-14 MED ORDER — HYDROMORPHONE HCL 1 MG/ML IJ SOLN
INTRAMUSCULAR | Status: AC
  Filled 2014-06-14: qty 1

## 2014-06-14 MED ORDER — BIVALIRUDIN 250 MG IV SOLR
INTRAVENOUS | Status: AC
  Filled 2014-06-14: qty 250

## 2014-06-14 MED ORDER — SODIUM CHLORIDE 0.9 % IV SOLN
1.0000 mL/kg/h | INTRAVENOUS | Status: AC
  Administered 2014-06-14: 1 mL/kg/h via INTRAVENOUS

## 2014-06-14 MED ORDER — HEPARIN SODIUM (PORCINE) 1000 UNIT/ML IJ SOLN
INTRAMUSCULAR | Status: AC
  Filled 2014-06-14: qty 1

## 2014-06-14 MED ORDER — INSULIN DETEMIR 100 UNIT/ML FLEXPEN: 40.0000 [IU] | PEN_INJECTOR | Freq: Every morning | SUBCUTANEOUS | Status: DC

## 2014-06-14 MED ORDER — VERAPAMIL HCL 2.5 MG/ML IV SOLN
INTRAVENOUS | Status: AC
  Filled 2014-06-14: qty 2

## 2014-06-14 MED ORDER — SODIUM CHLORIDE 0.9 % IJ SOLN: 3.0000 mL | INTRAMUSCULAR | Status: DC | PRN

## 2014-06-14 MED ORDER — SODIUM CHLORIDE 0.9 % IV SOLN: 250.0000 mL | INTRAVENOUS | Status: DC | PRN

## 2014-06-14 MED ORDER — SODIUM CHLORIDE 0.9 % IJ SOLN: 3.0000 mL | Freq: Two times a day (BID) | INTRAMUSCULAR | Status: DC

## 2014-06-14 MED ORDER — METFORMIN HCL 500 MG PO TABS
500.0000 mg | ORAL_TABLET | Freq: Two times a day (BID) | ORAL | Status: DC
  Filled 2014-06-14 (×2): qty 1

## 2014-06-14 MED ORDER — HYDROCODONE-ACETAMINOPHEN 5-325 MG PO TABS: 1.0000 | ORAL_TABLET | Freq: Four times a day (QID) | ORAL | Status: DC | PRN

## 2014-06-14 MED ORDER — LOSARTAN POTASSIUM 25 MG PO TABS
25.0000 mg | ORAL_TABLET | Freq: Every day | ORAL | Status: DC
  Administered 2014-06-14 – 2014-06-15 (×2): 25 mg via ORAL
  Filled 2014-06-14 (×2): qty 1

## 2014-06-14 NOTE — CV Procedure (Signed)
Procedure performed:  Left heart catheterization including hemodynamic monitoring of the left ventricle, LV gram. Selective right and left coronary arteriography. Thrombectomy of the mid RCA thrombus lesion, with a  Fetch 2 aspiration catheter, PTCA and stenting of the mid RCA with implantation of a 3.0 x 18 mm Xience Alpine DES. PTCA and stenting of the mid LAD with implantation of a 3.0 x 18 mm Xience Alpine DES.  Indication: Patient is a 53 year-old Caucasian male with history of hypertension,  hyperlipidemia,  Diabetes Mellitus   who presents with NSTEMI. Patient has prior history of CAD and angioplasty to his mid LAD with stent implantation and also to the distal right with stent implantation in the past, 2.75x12 mm Taxus in mid LAD, 2.5x12 , Distal RCA stent x 2 in 2004 and 2007.  Hemodynamic data: Left ventricular pressure was 139/7 with LVEDP of 11 mm mercury. Aortic pressure was 130/75 with a mean of 101 mm mercury. There was no pressure gradient across the aortic valve.   Left ventricle: Performed in the RAO projection revealed LVEF of 60 %. There was no significant MR. no significant wall motion abnormality.  Right coronary artery: It's a large caliber vessel and the proximal end, however immediately after its origin the right coronary artery is diffusely diseased. This was followed by a thrombotic high-grade 95% stenosis in the mid RCA. Proximal RCA has diffuse 40-50% stenosis. Distal RCA stent is widely patent. The PDA and PL branches are moderate sized with diffuse disease.  Left main coronary artery is large and normal.  Circumflex coronary artery: It is a moderate caliber vessel, with diffuse disease without any high-grade focal stenosis.  LAD:  LAD gives origin to a moderate sized D1 and D2. There is a stent just distal to the D2. It is widely patent. Proximal to the stent there is a high-grade 95% stenosis. LAD has diffuse disease and because very small caliber from mid to distal  segment.  Impression:  High-grade two-vessel coronary artery disease, mid RCA thrombotic stenosis and mid LAD atherosclerotic disease.  Interventional data: Successful  Thrombectomy of the mid RCA thrombus lesion, with a  Fetch 2 aspiration catheter, PTCA and stenting of the mid RCA with implantation of a 3.0 x 18 mm Xience Alpine DES. PTCA and stenting of the mid LAD with implantation of a 3.0 x 18 mm Xience Alpine DES.  Will need Dual antiplatelet therapy with BRILINTA and ASA 81 mg for at least one year, probably much longer given his cardiac history and cardiovascular risks..   Technique of diagnostic cardiac catheterization:  Under sterile precautions using a 6 French right radial  arterial access, a 6 French sheath was introduced into the right radial artery. A 5 [place] Tig 4 catheter was advanced into the ascending aorta selective  right coronary artery and left coronary artery was cannulated and angiography was performed in multiple views. The catheter was pulled back Out of the body over exchange length J-wire. Same Catheter was used to perform LV gram which was performed in RAO/LAO projection. Catheter exchanged out of the body over J-Wire. NO immediate complications noted.  Technique of intervention:  Using a 6 French 44 guide catheter the right  coronary  was selected and cannulated. Using Angiomax for anticoagulation, I utilized a cougar guidewire and across the mid right coronary artery without any difficulty. I placed the tip of the wire into the distal  coronary artery. Angiography was performed. I then performed thrombectomy with Fetch-2 catheter. There was  TIMI-3 flow prior to angioplasty and this was maintained. Significant amount of thrombus was aspirated. This was followed by direct stenting with a 3.0 x 18 mm Xience stent which was deployed at 12 atmospheric pressure for 50 seconds. Intracoronary nitroglycerin was administered and angiography was performed. Proximal stenosis which  appeared to be high-grade was visualized in multiple views, this was a diffuse disease constituting about 40-50% diffuse disease and left alone. Catheter was pulled out of the body over J-wire.  I then utilized a 6 [place] XB 3.5 LAD guide catheter to engage the left main coronary artery. Using the same guidewire I attempted to directly stent the mid LAD, however the stent would not cross the stenosis and stent completely occluded the vessel lumen. I then performed a 2.5X15 mm Euphora  balloon , I performed balloon angioplasty at 12 and 14 atmospheric pressure pressure x 2 for 20 seconds each.  I proceeded with implantation of a 3.0 x 18 mm Xience Alpine  drug-eluting stent into the mid LAD coronary artery, overlapping with the previously placed mid LAD stent. The stent was deployed at 8 atmospheric pressure for 50  seconds. The stent was then post dilated with same stent balloon after advanced the stent balloon into the previously placed stent and a 12 atmospheric balloon inflation was performed for 30 seconds. Excellent results were obtained with 0% residual stenoses and TIMI-3 flow was maintained. There was no evidence of edge dissection. The guidewire was withdrawn out of the body and the guide catheter was engaged and pulled out of the body over the J-wire the was no immediate complication. Patient tolerated the procedure well.  Hemostasis achieved with TR band. Patient tolerated the procedure well.    Disposition: Patient will be discharged in morning unless complications with out-patient follow up. A total of 175 cc of contrast was utilized for diagnostic and interventional procedure.

## 2014-06-14 NOTE — Interval H&P Note (Signed)
History and Physical Interval Note:  06/14/2014 6:27 AM  Tony Manning  has presented today for surgery, with the diagnosis of cp  The various methods of treatment have been discussed with the patient and family. After consideration of risks, benefits and other options for treatment, the patient has consented to  Procedure(s): LEFT HEART CATHETERIZATION WITH CORONARY ANGIOGRAM (N/A) and possible PCI as a surgical intervention .  The patient's history has been reviewed, patient examined, no change in status, stable for surgery.  I have reviewed the patient's chart and labs.  Questions were answered to the patient's satisfaction.   Cath Lab Visit (complete for each Cath Lab visit)  Clinical Evaluation Leading to the Procedure:   ACS: Yes.    Non-ACS:    Anginal Classification: CCS IV  Anti-ischemic medical therapy: Maximal Therapy (2 or more classes of medications)  Non-Invasive Test Results: No non-invasive testing performed  Prior CABG: No previous CABG         [facility]

## 2014-06-14 NOTE — Progress Notes (Signed)
[place] for heparin Indication: chest pain/ACS  Allergies  Allergen Reactions  . Gabapentin Other (See Comments)    REACTION: lack of effect for pain  . Lisinopril Cough  . Tramadol     diarrhea    Patient Measurements: Height: 5\' 10"  (177.8 cm) Weight: 211 lb 3.2 oz (95.8 kg) IBW/kg (Calculated) : 73 Heparin Dosing Weight: 92kg  Vital Signs: Temp: 98 F (36.7 C) (10/07 1928) Temp Source: Oral (10/07 1928) BP: 138/57 mmHg (10/07 1928) Pulse Rate: 67 (10/07 1928)  Labs:  Recent Labs  06/13/14 1515 06/13/14 1743 06/13/14 1830 06/13/14 2250 06/14/14 0130  HGB  --  15.0  --   --   --   HCT  --  43.4  --   --   --   PLT  --  207  --   --   --   HEPARINUNFRC  --   --   --   --  0.46  CREATININE 0.94  --   --   --   --   TROPONINI  --   --  2.68* 2.60*  --     Estimated Creatinine Clearance: 104.3 ml/min (by C-G formula based on Cr of 0.94).   Assessment: 53 y.o. male with chest pain for heparin   Goal of Therapy:  Heparin level 0.3-0.7 units/ml Monitor platelets by anticoagulation protocol: Yes   Plan:  Continue Heparin at current rate F/U after cath  Phillis Knack, PharmD, BCPS   06/14/2014 3:00 AM

## 2014-06-14 NOTE — Progress Notes (Signed)
RECEIVED PT FROM CATH LAB, NO ACUTE DISTRESS. NO CHEST PAIN, NO SOB. ALERT ORIENTED. TR BAND IN PLACE AT R WRIST, NO HEMATOMA, NO BLEEDING. NO NUMBNESS/TINGLING. ELEVATED ON PILLOW, ADVISED TO KEEP RUE IN ELEVATED POSITION.

## 2014-06-14 NOTE — Progress Notes (Signed)
TR BAND REMOVAL  LOCATION:    right radial  DEFLATED PER PROTOCOL:    Yes.    TIME BAND OFF / DRESSING APPLIED:    1330   SITE UPON ARRIVAL:    Level 0  SITE AFTER BAND REMOVAL:    Level 0  CIRCULATION SENSATION AND MOVEMENT:    Within Normal Limits   Yes.    COMMENTS:   Rechecked site at 1400 with no change noted in assessment

## 2014-06-14 NOTE — Telephone Encounter (Signed)
Noted, I'll await inpatient reports. Thanks.

## 2014-06-14 NOTE — Care Management Note (Addendum)
  Page 1 of 1   06/15/2014     11:52:23 AM CARE MANAGEMENT NOTE 06/15/2014  Patient:  Tony Manning, Tony Manning   Account Number:  [id number]  Date Initiated:  06/14/2014  Documentation initiated by:  Mariann Laster  Subjective/Objective Assessment:   CP     Action/Plan:   CM to follow for disposition needs   Anticipated DC Date:  06/15/2014   Anticipated DC Plan:  [place]  CM consult  Medication Assistance      Choice offered to / List presented to:             Status of service:  Completed, signed off Medicare Important Message given?   (If response is "NO", the following Medicare IM given date fields will be blank) Date Medicare IM given:   Medicare IM given by:   Date Additional Medicare IM given:   Additional Medicare IM given by:    Discharge Disposition:  HOME/SELF CARE  Per UR Regulation:  Reviewed for med. necessity/level of care/duration of stay  If discussed at [place] of Stay Meetings, dates discussed:    Comments:  Honesty Menta RN, BSN, MSHL, CCM  Nurse - Case Manager,  (Unit [phone]  06/15/2014 Per ---06/14/2014 1631 by NIA SHEALY---PT COPAY WILL BE $58-PRIOR AUTH NOT [place].  Pt has discount card but still very concerned he may not be able to afford it. Dispo Plan:  Home / Self care.  Deandre Stansel RN, BSN, MSHL, CCM  Nurse - Case Manager,  (Unit [phone]  06/14/2014 Med Review:  Brilinta 90mg  bid - Benefits check pending. Dispo Plan:  Home - Self care.

## 2014-06-15 LAB — CBC
HCT: 42.5 % (ref 39.0–52.0)
Hemoglobin: 14.8 g/dL (ref 13.0–17.0)
MCH: 30.3 pg (ref 26.0–34.0)
MCHC: 34.8 g/dL (ref 30.0–36.0)
MCV: 86.9 fL (ref 78.0–100.0)
Platelets: 216 10*3/uL (ref 150–400)
RBC: 4.89 MIL/uL (ref 4.22–5.81)
RDW: 13.1 % (ref 11.5–15.5)
WBC: 9.2 10*3/uL (ref 4.0–10.5)

## 2014-06-15 LAB — BASIC METABOLIC PANEL
Anion gap: 11 (ref 5–15)
BUN: 12 mg/dL (ref 6–23)
CO2: 26 mEq/L (ref 19–32)
Calcium: 9.3 mg/dL (ref 8.4–10.5)
Chloride: 103 mEq/L (ref 96–112)
Creatinine, Ser: 0.97 mg/dL (ref 0.50–1.35)
GFR calc Af Amer: >90 mL/min (ref >90)
GFR calc non Af Amer: >90 mL/min (ref >90)
Glucose, Bld: 157 mg/dL — ABNORMAL HIGH (ref 70–99)
Potassium: 3.6 mEq/L — ABNORMAL LOW (ref 3.7–5.3)
Sodium: 140 mEq/L (ref 137–147)

## 2014-06-15 LAB — GLUCOSE, CAPILLARY: Glucose-Capillary: 180 mg/dL — ABNORMAL HIGH (ref 70–99)

## 2014-06-15 MED ORDER — TICAGRELOR 90 MG PO TABS: 90.0000 mg | ORAL_TABLET | Freq: Two times a day (BID) | ORAL | Status: DC

## 2014-06-15 MED ORDER — METFORMIN HCL 500 MG PO TABS: 500.0000 mg | ORAL_TABLET | Freq: Every evening | ORAL | Status: DC

## 2014-06-15 MED FILL — Sodium Chloride IV Soln 0.9%: INTRAVENOUS | Qty: 50 | Status: AC

## 2014-06-15 NOTE — Progress Notes (Signed)
Pt c/o chest "discomfort", describes as "about a 1" on 0-10 scale.  Denies radiation but also c/o rt shoulder pain that he believes is unrelated to his chest, also rates as 1.  EKG done, no changes from previous.  BP 145/72.  Pt anxious to go home and does not want NTG because it will give him a headache.  Tylenol given for arm pain, advised pt to call RN if pain any worse.  Rosaria Ferries PA updated.

## 2014-06-15 NOTE — Progress Notes (Signed)
CARDIAC REHAB PHASE I   PRE:  Rate/Rhythm: 65 SR    BP: sitting 131/65    SaO2:   MODE:  Ambulation: 1000 ft   POST:  Rate/Rhythm: 71 SR    BP: sitting 145/85     SaO2:   Tolerated well, no c/o, VSS. Pt has many harmful habits (chews tobacco, drinks Cheerwine, doesn't like vegetables, only eats one meal a day, no ex). Encouraged to make change and informed him of the risks. Pt is interested in [facility] and will send referral to [facility].  [phone]  Tony Manning CES, ACSM 06/15/2014 10:29 AM

## 2014-06-15 NOTE — Discharge Summary (Signed)
Physician Discharge Summary  Patient ID: Tony Manning MRN: [id number] DOB/AGE: [birth year] 53 y.o.  Admit date: 06/13/2014 Discharge date: 06/15/2014  Primary Discharge Diagnosis 1. NSTEMI inferior wall 2. 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 PTCA and stenting of the mid LAD with implantation of a 3.0 x 18 mm Xience Alpine DES. History of CAD s/p 2.75x12 mm Taxus in mid LAD, 2.5x12 , Distal RCA stent x 2 in 2004 and 2007. Last Cardiac cath 6/08 patent stents.  Secondary Discharge Diagnosis 3. Diabetes mellitus type 2 uncontrolled. HbA1c 05/17/2014: 7.6%.  4. Hyperlipidemia. 11/09/2013: Total cholesterol 160, triglycerides 140, HDL 40, LDL 91. CMP normal   Significant Diagnostic Studies:  Consults:   Hospital Course: The patient is a 53 year old male who presents for a recheck of CAD. Tony Manning is a 53 year old Caucasian male with history of uncontrolled diabetes, hypertension, hyperlipidemia who was had severe diffuse disease his coronaries, has 2 stents in the right coronary artery and one stent in the mid LAD. Last cardiac catheterization was in June 2008 revealing patent stents, and he has had a nuclear stress test in February 2012 which was normal.  He presents today for a stat appointment today with complaints of chest pain. Patient was scheduled to have a nuclear stress test following last appointment but did not show.  He reports that he developed severe chest pain last night and again this morning. Pain was moderate in severity last night, substernal in nature and radiated to the left shoulder and arm. He took nitro x 2 last night with little improvement, however he took asa and felt better.. Pain lasted for ~4-5 hours then resolved. Pain recurred this am and was 10/10 in severity. No associated  nausea or vomiting; he does report a sense of "feeling hot" but no true diaphoresis. Patient took ASA x 3 and Nitro x 1 with relief in pain (appears to be more from the Aspirin than nitro). No associated SOB. Chest pain also worse when he layed down. No hemoptysis. No leg edema. No recent exacerbation/worsening of GERD.  Patient was seen in the office on the day of admission and although he was recommended admission to the hospital, patient wanted to confirm that he is having myocardial infarction or unstable angina. Hence a stat troponin was sent which was abnormal at 1.5. Hence patient was advised to return back and go to the hospital for already angiography.   Patient admitted to the hospital, following morning underwent coronary angiography and successful angioplasty, 2 vessel angioplasty without any complication.  He was kept for an additional day, was felt stable for discharge the following morning.  Recommendations on discharge: patient has been started on Brilinta 90 mg by mouth twice a day, outpatient follow-up, again discussed regarding weight loss and better control of diabetes mellitus.  Discharge Exam: Blood pressure 143/79, pulse 62, temperature 97.8 F (36.6 C), temperature source Oral, resp. rate 20, height 5\' 10"  (1.778 m), weight 96.1 kg (211 lb 13.8 oz), SpO2 97.00%.   General: Well built and overweight body habitus who is in no acute distress. Appears stated age. Alert Ox3.  There is no cyanosis. HEENT: normal limits. No JVD.  CARDIAC EXAM: S1, S2 normal, no gallop present. No murmur.  CHEST EXAM: Mild tenderness of left chest wall. LUNGS: Clear to  percuss and auscultate.  ABDOMEN: No hepatosplenomegaly. BS normal in all 4 quadrants. Abdomen is non-tender.  EXTREMITY: Full range of movementes, No edema. MUSCULOSKELETAL EXAM: Intact with full range of motion in all 4 extremities.  NEUROLOGIC EXAM: Grossly intact without any focal deficits. Alert O x 3.  Right radial arterial  access site has healed well.  Labs:  HbA1c 06/13/2014:  7.2%   Lab Results  Component Value Date   WBC 9.2 06/15/2014   HGB 14.8 06/15/2014   HCT 42.5 06/15/2014   MCV 86.9 06/15/2014   PLT 216 06/15/2014    Recent Labs Lab 06/13/14 1515 06/15/14 0354  NA 138 140  K 4.2 3.6*  CL 102 103  CO2 23 26  BUN 17 12  CREATININE 0.94 0.97  CALCIUM 9.6 9.3  PROT 7.7  --   BILITOT 0.5  --   ALKPHOS 64  --   ALT 29  --   AST 35  --   GLUCOSE 102* 157*   Lab Results  Component Value Date   CKTOTAL 72 09/29/2010   CKMB 1.3 09/29/2010   TROPONINI 2.17* 06/14/2014    Lipid Panel     Component Value Date/Time   CHOL 160 11/09/2013 0920   TRIG 140.0 11/09/2013 0920   HDL 40.90 11/09/2013 0920   CHOLHDL 4 11/09/2013 0920   VLDL 28.0 11/09/2013 0920   LDLCALC 91 11/09/2013 0920   EKG: Normal sinus rhythm, normal axis. Inferior T wave inversion, new from prior EKG on 05/30/2014, suggestive of ischemia. Abnormal EKG. No change from 06/13/2014.   Radiology: No results found.    FOLLOW UP PLANS AND APPOINTMENTS    Medication List         aspirin 81 MG tablet  Take 81 mg by mouth 2 (two) times daily.     carvedilol 3.125 MG tablet  Commonly known as:  COREG  Take 1 tablet (3.125 mg total) by mouth 2 (two) times daily with a meal.     glimepiride 4 MG tablet  Commonly known as:  AMARYL  Take 4 mg by mouth 2 (two) times daily.     HYDROcodone-acetaminophen 5-325 MG per tablet  Commonly known as:  NORCO/VICODIN  Take 1 tablet by mouth every 6 (six) hours as needed for moderate pain (sedation caution).     insulin aspart 100 UNIT/ML injection  Commonly known as:  novoLOG  Inject 4 Units into the skin as needed for high blood sugar (takes if BG >140).     Insulin Detemir 100 UNIT/ML Pen  Commonly known as:  LEVEMIR  Inject 40 Units into the skin every morning.     losartan 25 MG tablet  Commonly known as:  COZAAR  Take 1 tablet (25 mg total) by mouth daily.     metFORMIN 500 MG  tablet  Commonly known as:  GLUCOPHAGE  Take 1 tablet (500 mg total) by mouth every evening.  Start taking on:  06/16/2014     nitroGLYCERIN 0.4 MG SL tablet  Commonly known as:  NITROSTAT  Place 1 tablet (0.4 mg total) under the tongue every 5 (five) minutes as needed for chest pain.     omeprazole 40 MG capsule  Commonly known as:  PRILOSEC  Take 40 mg by mouth every morning.     pravastatin 40 MG tablet  Commonly known as:  PRAVACHOL  Take 40 mg by mouth every morning.     tamsulosin 0.4 MG Caps capsule  Commonly known as:  FLOMAX  Take 0.4 mg by mouth as needed (for kidney stones).     ticagrelor 90 MG Tabs tablet  Commonly known as:  BRILINTA  Take 90 mg by mouth 2 (two) times daily.           Follow-up Information   Follow up with Laverda Page, MD. (Call to be seen in 2 weeks)    Specialty:  Cardiology   Contact information:   [place]. [address] [place] [address] [phone]       Follow up On 06/28/2014. (1 pm appointment)        Laverda Page, MD 06/15/2014, 8:58 AM  Pager: [phone] Office: [phone] If no answer: [phone]

## 2014-06-20 ENCOUNTER — Encounter: Payer: Self-pay | Admitting: Family Medicine

## 2014-06-20 ENCOUNTER — Ambulatory Visit (INDEPENDENT_AMBULATORY_CARE_PROVIDER_SITE_OTHER): Payer: Medicare Other | Admitting: Family Medicine

## 2014-06-20 VITALS — BP 122/82 | HR 68 | Temp 98.0°F | Wt 208.8 lb

## 2014-06-20 DIAGNOSIS — I214 Non-ST elevation (NSTEMI) myocardial infarction: Secondary | ICD-10-CM

## 2014-06-20 MED ORDER — INSULIN DETEMIR 100 UNIT/ML FLEXPEN: 30.0000 [IU] | PEN_INJECTOR | Freq: Every morning | SUBCUTANEOUS | Status: DC

## 2014-06-20 NOTE — Progress Notes (Signed)
Pre visit review using our clinic review tool, if applicable. No additional management support is needed unless otherwise documented below in the visit note.  To recap- admitted with MI, cath and sent done, A1c down to 7.1.  Labs and procedures d/w pt.  No CP similar to episode prior to admission.  Not yet in cardiac rehab yet.  Here with wife to discuss plans, recent events.  All d/w pt in detail re: hospital course and plan from this point on.   Sig change in diet in the meantime, with sugars now <100 occ, lower then prev, on a low carb diet.  Meds, vitals, and allergies reviewed.   ROS: See HPI.  Otherwise, noncontributory.  nad Exam deferred.  All time spent in counseling, record review, direct contact with patient.

## 2014-06-20 NOTE — Assessment & Plan Note (Addendum)
S/p stent.  D/w pt about risk factors, current meds. No ADE on meds, no bleeding.  No episodes similar to presentation to hospital since d/c.  D/w pt about DM2 control. He'll titrate levemir and use mealtime insulin if needed. D/w pt about cardiac rehab.  D/w pt about diet, carbs, foods to avoid.  Handout given.  D/w pt re: ADA website along with meal planning, grocery shopping, substitutions.   He'll work on low carb diet and will notify me as needed re: sugars.  BP controlled in meantime.  Still on statin in meantime.  Recheck in about 3 months.   He'll f/u with cards in the meantime.   >40 minutes spent in face to face time with patient, >50% spent in counselling or coordination of care

## 2014-06-20 NOTE — Patient Instructions (Addendum)
Look at http://www.diabetes.org/.  Food and fitness--> food tips and understanding carbohydrates.   Look at the eat right diet.  Start comparing labels for carbs at the grocery store.  Shop "on the edges" of the grocery store.    Keep the appointment with Dr. Einar Gip and ask about cardiac rehab.  levemir change.  If AM sugar is >120, add a unit.  If AM sugar is <80, decrease a unit.  If 80-120, then give the same dose as the day before.   Recheck A1c in about 3 months before a visit.  Take care.  Glad to see you.

## 2014-06-29 ENCOUNTER — Other Ambulatory Visit: Payer: Self-pay | Admitting: *Deleted

## 2014-06-29 NOTE — Telephone Encounter (Signed)
Pharmacy faxed refill request for One Touch Ultra test strips with a note that pt wants a new rx to test 6 times daily with a new quantity of # 200. Is it ok to refill with these directions and quantity?

## 2014-07-01 MED ORDER — GLUCOSE BLOOD VI STRP: ORAL_STRIP | Status: DC

## 2014-07-01 NOTE — Telephone Encounter (Signed)
Sent!

## 2014-07-03 ENCOUNTER — Telehealth: Payer: Self-pay | Admitting: *Deleted

## 2014-07-03 NOTE — Telephone Encounter (Signed)
Received a prior auth request from [facility] on pt's one touch test strips. Prior auth forms completed through cover my meds, and faxed to insurance company. Awaiting response from insurance company.

## 2014-07-04 NOTE — Telephone Encounter (Signed)
Noted, thanks!

## 2014-07-04 NOTE — Telephone Encounter (Signed)
Received denial letter from [employer]. Per letter DM testing supplies aren't covered under Medicare Part D because they are considered DME. DME may be covered under Medicare Part B, and has been forwarded to Medicare part B for review. Denial letter placed in your inbox.

## 2014-07-04 NOTE — Telephone Encounter (Signed)
Patient notified that [name] denied coverage on his test strips. Patient stated that he has been getting them from [facility] which is much cheaper than he could have gotten them thru his insurance. Patient stated that he will continue to get them from [facility] and pay for them.

## 2014-07-05 NOTE — Telephone Encounter (Signed)
Notify pt/pharmacy.  Letter sent for scanning.

## 2014-07-05 NOTE — Telephone Encounter (Signed)
Received fax this am Medicare Part B reviewed, and has approved test strips effective 07/04/14-09/07/15. Approval letter placed in your inbox.

## 2014-07-13 ENCOUNTER — Other Ambulatory Visit: Payer: Self-pay | Admitting: Family Medicine

## 2014-07-13 NOTE — Telephone Encounter (Signed)
Please advise if pt is to be taking requested medications--please advise--pt is also overdue for [facility] wellness and/or CPE

## 2014-07-13 NOTE — Telephone Encounter (Signed)
Sent.  His CPE had been pushed back with other recent acute events.  I do want to see him back in mid 09/2013 for a DM2 f/u.

## 2014-07-14 ENCOUNTER — Other Ambulatory Visit: Payer: Self-pay | Admitting: Family Medicine

## 2014-07-26 ENCOUNTER — Telehealth: Payer: Self-pay | Admitting: Family Medicine

## 2014-07-26 NOTE — Telephone Encounter (Signed)
Pt calling in saying that the form he dropped off to be filled out back in October was never received by the [facility] Office in [place]. I reviewed the chart info and informed pt that his wife was supposed to pick up the form. He asked that I fax it. I am fixing it to the office fax [phone] on the form. Pt said it was ok for me to disgard the form when done.

## 2014-08-09 ENCOUNTER — Other Ambulatory Visit: Payer: Self-pay

## 2014-08-09 NOTE — Telephone Encounter (Signed)
Pt left v/m requesting rx hydrocodone apap. Call when ready for pick up. Pt having neck and back pain. Pt also wants to pick up office notes to get diabetic shoes when picks up prescription.Please advise.

## 2014-08-10 MED ORDER — HYDROCODONE-ACETAMINOPHEN 5-325 MG PO TABS: 1.0000 | ORAL_TABLET | Freq: Four times a day (QID) | ORAL | Status: DC | PRN

## 2014-08-10 NOTE — Telephone Encounter (Signed)
Rx printed.  OV note from 05/22/14 should cover the documentation.  Thanks.

## 2014-08-10 NOTE — Telephone Encounter (Signed)
Patient advised. Rx left at front desk for pick up, OV note included.

## 2014-08-16 ENCOUNTER — Encounter (HOSPITAL_COMMUNITY): Payer: Self-pay | Admitting: Cardiology

## 2014-09-04 ENCOUNTER — Institutional Professional Consult (permissible substitution): Payer: Medicare Other | Admitting: Pulmonary Disease

## 2014-09-14 ENCOUNTER — Ambulatory Visit (INDEPENDENT_AMBULATORY_CARE_PROVIDER_SITE_OTHER): Payer: Medicare Other | Admitting: Family Medicine

## 2014-09-14 ENCOUNTER — Encounter: Payer: Self-pay | Admitting: Family Medicine

## 2014-09-14 ENCOUNTER — Ambulatory Visit (INDEPENDENT_AMBULATORY_CARE_PROVIDER_SITE_OTHER)
Admission: RE | Admit: 2014-09-14 | Discharge: 2014-09-14 | Disposition: A | Discharge status: Home / Self Care | Payer: Medicare Other | Source: Ambulatory Visit | Attending: Family Medicine | Admitting: Family Medicine

## 2014-09-14 VITALS — BP 120/68 | HR 66 | Temp 97.8°F | Wt 207.8 lb

## 2014-09-14 DIAGNOSIS — E119 Type 2 diabetes mellitus without complications: Secondary | ICD-10-CM

## 2014-09-14 DIAGNOSIS — M79672 Pain in left foot: Secondary | ICD-10-CM

## 2014-09-14 DIAGNOSIS — E1149 Type 2 diabetes mellitus with other diabetic neurological complication: Secondary | ICD-10-CM

## 2014-09-14 DIAGNOSIS — F524 Premature ejaculation: Secondary | ICD-10-CM

## 2014-09-14 DIAGNOSIS — M79673 Pain in unspecified foot: Secondary | ICD-10-CM

## 2014-09-14 LAB — HEMOGLOBIN A1C: Hgb A1c MFr Bld: 6.6 % — ABNORMAL HIGH (ref 4.6–6.5)

## 2014-09-14 MED ORDER — INSULIN DETEMIR 100 UNIT/ML FLEXPEN: 24.0000 [IU] | PEN_INJECTOR | Freq: Every morning | SUBCUTANEOUS | Status: DC

## 2014-09-14 NOTE — Progress Notes (Signed)
Pre visit review using our clinic review tool, if applicable. No additional management support is needed unless otherwise documented below in the visit note.  Progressively worse L heel pain, L>>R, going on for the last week.  H/o plantar fasciitis.  No recent injury.  He has DM2 inserts, not changed recently.  Pain is medial to the calcaneus and on the plantar side of calcaneus.  A heel lift didn't help.   H/o premature ejaculation.  Asking about options.  No tx currently.  He was going to ask about his antiplatelet meds and I wanted that addressed before anything else was changed.  He'll check with cards.   DM2. Due for f/u labs.  Sugar has been up to 144 in AM, usually lower, usually <130 in AMs, often <100.  Working on diet.  No ADE on meds.  See notes on labs.   Meds, vitals, and allergies reviewed.   ROS: See HPI.  Otherwise, noncontributory.  nad Mmm rrr ctab Skin w/o acute rash L foot exam.  Normal pulse.  Dropped MT head noted.  Slightly ttp at the plantar calcaneus No rash. No skin breakdown on feet.

## 2014-09-14 NOTE — Patient Instructions (Addendum)
Go to the lab on the way out.  We'll contact you with your xray and lab report. Use an arch support in the meantime.  Roll your arch on a cold bottle to stretch your foot.  If not better, then I want you to follow up with Dr. Lorelei Pont.

## 2014-09-17 DIAGNOSIS — F524 Premature ejaculation: Secondary | ICD-10-CM | POA: Insufficient documentation

## 2014-09-17 DIAGNOSIS — M79673 Pain in unspecified foot: Secondary | ICD-10-CM | POA: Insufficient documentation

## 2014-09-17 NOTE — Assessment & Plan Note (Signed)
Asking about options.  No tx currently.  He was going to ask about his antiplatelet meds and I wanted that addressed before anything else was changed.  He'll check with cards first re: antiplatelet tx.

## 2014-09-17 NOTE — Assessment & Plan Note (Signed)
Usually with fair control on home checks.  No change in meds at this point, see notes on labs.  He agrees.

## 2014-09-17 NOTE — Assessment & Plan Note (Signed)
D/w pt re: anatomy.  See notes on xray.  Likely plantar fasciitis, would need more arch support and stretching.  If not improved, then I want him to see Dr. Lorelei Pont.  D/w pt.  He agrees.

## 2014-10-08 ENCOUNTER — Other Ambulatory Visit: Payer: Self-pay | Admitting: Family Medicine

## 2014-11-03 ENCOUNTER — Other Ambulatory Visit: Payer: Self-pay | Admitting: Family Medicine

## 2014-12-08 ENCOUNTER — Other Ambulatory Visit: Payer: Self-pay | Admitting: Family Medicine

## 2014-12-18 LAB — HM DIABETES EYE EXAM

## 2014-12-24 ENCOUNTER — Ambulatory Visit (INDEPENDENT_AMBULATORY_CARE_PROVIDER_SITE_OTHER): Payer: Medicare Other | Admitting: Family Medicine

## 2014-12-24 ENCOUNTER — Encounter: Payer: Self-pay | Admitting: Family Medicine

## 2014-12-24 ENCOUNTER — Ambulatory Visit (INDEPENDENT_AMBULATORY_CARE_PROVIDER_SITE_OTHER)
Admission: RE | Admit: 2014-12-24 | Discharge: 2014-12-24 | Disposition: A | Discharge status: Home / Self Care | Payer: Medicare Other | Source: Ambulatory Visit | Attending: Family Medicine | Admitting: Family Medicine

## 2014-12-24 VITALS — BP 104/70 | HR 61 | Temp 97.8°F | Ht 70.0 in | Wt 206.0 lb

## 2014-12-24 DIAGNOSIS — M79641 Pain in right hand: Secondary | ICD-10-CM | POA: Diagnosis not present

## 2014-12-24 NOTE — Progress Notes (Signed)
Pre visit review using our clinic review tool, if applicable. No additional management support is needed unless otherwise documented below in the visit note. 

## 2014-12-24 NOTE — Progress Notes (Signed)
Dr. Frederico Hamman T. Regis Wiland, MD, [facility] [place], [address] Phone: [phone] Fax: [phone]  12/24/2014  Patient: Tony Manning, MRN: [id number], DOB: 1961/01/14, 54 y.o.  Primary Physician:  [name], MD  Chief Complaint: Hand Pain  Subjective:   Tony Manning is a 54 y.o. very pleasant male patient who presents with the following:  Friday, had a bracket slammed into it on the R. Has been swollen a lot on dorsum, some bruising. Wanted to check and make sure not broken.  Past Medical History, Surgical History, Social History, Family History, Problem List, Medications, and Allergies have been reviewed and updated if relevant.  GEN: No fevers, chills. Nontoxic. Primarily MSK c/o today. MSK: Detailed in the HPI GI: tolerating PO intake without difficulty Neuro: No numbness, parasthesias, or tingling associated. Otherwise the pertinent positives of the ROS are noted above.   Objective:   BP 104/70 mmHg  Pulse 61  Temp(Src) 97.8 F (36.6 C) (Oral)  Ht 5\' 10"  (1.778 m)  Wt 206 lb (93.441 kg)  BMI 29.56 kg/m2   GEN: WDWN, NAD, Non-toxic, Alert & Oriented x 3 HEENT: Atraumatic, Normocephalic.  Ears and Nose: No external deformity. EXTR: No clubbing/cyanosis/edema NEURO: Normal gait.  PSYCH: Normally interactive. Conversant. Not depressed or anxious appearing.  Calm demeanor.   R hand Ecchymosis or edema: fairly extensive dorsal swelling - no focal tenderness ROM wrist/hand/digits: full  Carpals, MCP's, digits: NT Distal Ulna and Radius: NT No instability Cysts/nodules: neg Digit triggering: neg Finkelstein's test: neg Snuffbox tenderness: neg Scaphoid tubercle: NT Resisted supination: NT Full composite fist, no malrotation Grip, all digits: 5/5 str DIPJT: NT PIP JT: NT MCP JT: NT No tenosynovitis Axial load test: neg Phalen's: neg Tinel's: neg Atrophy: neg  Hand sensation:  intact   Radiology: Dg Hand Complete Right  12/24/2014   CLINICAL DATA:  Direct trauma to the left hand 3 days ago with pain and bruising over the proximal phalanges and metacarpals of the third and fourth fingers  EXAM: RIGHT HAND - COMPLETE 3+ VIEW  COMPARISON:  None.  FINDINGS: The bones of the right hand are adequately mineralized. There is no acute fracture nor dislocation. The interphalangeal, metacarpophalangeal, and carpo metacarpal joints are unremarkable. There is mild soft tissue swelling over the third and fourth digits. The metacarpals are unremarkable.  IMPRESSION: There is no acute bony abnormality of the right hand. There is soft tissue swelling of the third and fourth digits and of the dorsal aspect of the hand.   Electronically Signed   By: David  Martinique   On: 12/24/2014 11:18     Assessment and Plan:   Right hand pain - Plan: DG Hand Complete Right  Soft tissue and bone contusion. Reassured, ice.  Follow-up: No Follow-up on file.  New Prescriptions   No medications on file   Modified Medications   No medications on file   Orders Placed This Encounter  Procedures  . DG Hand Complete Right    Signed,  Kushal Saunders T. Lateia Fraser, MD   Patient's Medications  New Prescriptions   No medications on file  Previous Medications   ASPIRIN 81 MG TABLET    Take 81 mg by mouth 2 (two) times daily.     CARVEDILOL (COREG) 3.125 MG TABLET    Take 1 tablet (3.125 mg total) by mouth 2 (two) times daily with a meal.   GLIMEPIRIDE (AMARYL) 4 MG TABLET  Take 4 mg by mouth 2 (two) times daily.   GLUCOSE BLOOD (ONE TOUCH ULTRA TEST) TEST STRIP    Check sugar up to 6 times a day.  DM2, insulin treated, uncontrolled.   HYDROCODONE-ACETAMINOPHEN (NORCO/VICODIN) 5-325 MG PER TABLET    Take 1 tablet by mouth every 6 (six) hours as needed for moderate pain (sedation caution).   INSULIN ASPART (NOVOLOG) 100 UNIT/ML INJECTION    Inject 4 Units into the skin as needed for high blood sugar (takes  if BG >140).   INSULIN DETEMIR (LEVEMIR) 100 UNIT/ML PEN    Inject 24 Units into the skin every morning. If AM sugar >120, add a unit. If AM sugar <80, decrease a unit.  If 80-120, then no change in dose   LOSARTAN (COZAAR) 25 MG TABLET    TAKE 1 TABLET BY MOUTH DAILY   METFORMIN (GLUCOPHAGE) 1000 MG TABLET    TAKE 1 TABLET BY MOUTH TWICE A DAY WITH A MEAL   NITROGLYCERIN (NITROSTAT) 0.4 MG SL TABLET    Place 1 tablet (0.4 mg total) under the tongue every 5 (five) minutes as needed for chest pain.   OMEPRAZOLE (PRILOSEC) 40 MG CAPSULE    Take 40 mg by mouth every morning.    PRAVASTATIN (PRAVACHOL) 40 MG TABLET    TAKE 1 TABLET BY MOUTH DAILY   TAMSULOSIN (FLOMAX) 0.4 MG CAPS CAPSULE    Take 0.4 mg by mouth as needed (for kidney stones).   TICAGRELOR (BRILINTA) 90 MG TABS TABLET    Take 1 tablet (90 mg total) by mouth 2 (two) times daily.  Modified Medications   No medications on file  Discontinued Medications   LEVEMIR FLEXTOUCH 100 UNIT/ML PEN    INJECT 30 UNITS INTO THE SKIN ONCE A DAYAT 10PM   METFORMIN (GLUCOPHAGE) 500 MG TABLET    Take 1 tablet (500 mg total) by mouth every evening.

## 2014-12-25 ENCOUNTER — Encounter: Payer: Self-pay | Admitting: Family Medicine

## 2015-02-20 ENCOUNTER — Ambulatory Visit: Payer: Medicare Other | Admitting: Family Medicine

## 2015-02-26 ENCOUNTER — Ambulatory Visit: Payer: Medicare Other | Admitting: Family Medicine

## 2015-04-04 ENCOUNTER — Encounter: Payer: Self-pay | Admitting: Family Medicine

## 2015-04-04 ENCOUNTER — Ambulatory Visit (INDEPENDENT_AMBULATORY_CARE_PROVIDER_SITE_OTHER): Payer: Medicare Other | Admitting: Family Medicine

## 2015-04-04 VITALS — BP 126/62 | HR 65 | Temp 98.2°F | Wt 200.0 lb

## 2015-04-04 DIAGNOSIS — M792 Neuralgia and neuritis, unspecified: Secondary | ICD-10-CM | POA: Diagnosis not present

## 2015-04-04 DIAGNOSIS — M79643 Pain in unspecified hand: Secondary | ICD-10-CM | POA: Diagnosis not present

## 2015-04-04 MED ORDER — INSULIN DETEMIR 100 UNIT/ML FLEXPEN: 20.0000 [IU] | PEN_INJECTOR | Freq: Every morning | SUBCUTANEOUS | Status: DC

## 2015-04-04 MED ORDER — DICLOFENAC SODIUM 1 % TD GEL: 4.0000 g | Freq: Four times a day (QID) | TRANSDERMAL | Status: DC

## 2015-04-04 NOTE — Patient Instructions (Addendum)
Schedule a physical when you can.  Labs ahead of time.   Rosaria Ferries will call about your referral. Use voltaren gel on your hands and see if that helps.  If you have more trouble with the "catching" on your finger, then ask about seeing Dr. Lorelei Pont.  Take care.  Glad to see you.

## 2015-04-04 NOTE — Progress Notes (Signed)
Pre visit review using our clinic review tool, if applicable. No additional management support is needed unless otherwise documented below in the visit note.  Hand pain.  R 3rd and 4th fingers are sore, stiff.  Same with the L 3rd finger.  Started taking glucomamine chondroitin and tylenol prn for pain; both were are recent start.  He has some occ "catching" on the R 4th PIP.  His IP joints are usually not puffy or red.  He does better during the day, after he has been moving his hand some.    R radicular arm pain noted.  No true weakness but pain with ROM of the RUE.  H/o fusion noted.  Asking about option.  Sx are similar to prev before his fusion.   H/o handicap placard due to pain from walking, I filled out his forms again at the [facility].  This is reasonable.    Meds, vitals, and allergies reviewed.   ROS: See HPI.  Otherwise, noncontributory.  nad ncat Neck supple, but pain with ROM along the R trap and upper arm.  No midline C spine pain.   No LA rrr ctab abd soft Ext w/o edema Dec in grip B from pain but no true weakness per se.  Sensation still intact.  No acute joint swelling or erythema in the digits or hands B

## 2015-04-05 DIAGNOSIS — M792 Neuralgia and neuritis, unspecified: Secondary | ICD-10-CM | POA: Insufficient documentation

## 2015-04-05 DIAGNOSIS — M79643 Pain in unspecified hand: Secondary | ICD-10-CM | POA: Insufficient documentation

## 2015-04-05 NOTE — Assessment & Plan Note (Signed)
I called rady re: CT vs MR, reasonable to proceed with MR.  Will arrange.  We may need to refer patient after MR done, had seen Dr. Brent Bulla.  Still okay for outpatient f/u.

## 2015-04-05 NOTE — Assessment & Plan Note (Signed)
Likely tendonitis component.  If trigger is worse, then I want him to call about seeing Dr. Lorelei Pont.  Prev imaging w/o sig arthritis in the digitis.  Images reviewed with patient at [facility].

## 2015-04-09 ENCOUNTER — Ambulatory Visit
Admission: RE | Admit: 2015-04-09 | Discharge: 2015-04-09 | Disposition: A | Discharge status: Home / Self Care | Payer: Medicare Other | Source: Ambulatory Visit | Attending: Family Medicine | Admitting: Family Medicine

## 2015-04-09 ENCOUNTER — Other Ambulatory Visit: Payer: Self-pay | Admitting: Family Medicine

## 2015-04-09 DIAGNOSIS — M792 Neuralgia and neuritis, unspecified: Secondary | ICD-10-CM

## 2015-04-09 DIAGNOSIS — Z77018 Contact with and (suspected) exposure to other hazardous metals: Secondary | ICD-10-CM

## 2015-04-10 ENCOUNTER — Other Ambulatory Visit: Payer: Self-pay | Admitting: Family Medicine

## 2015-04-10 DIAGNOSIS — M792 Neuralgia and neuritis, unspecified: Secondary | ICD-10-CM

## 2015-04-25 ENCOUNTER — Telehealth: Payer: Self-pay

## 2015-04-25 MED ORDER — METFORMIN HCL 1000 MG PO TABS: ORAL_TABLET | ORAL | Status: DC

## 2015-04-25 NOTE — Telephone Encounter (Signed)
Thanks

## 2015-04-25 NOTE — Telephone Encounter (Signed)
Sandy  RN case mgr with medication adherence with [employer] left v/m; metformin is on 30 day supply refill at this time; pt has been skipping months picking up 30 day med and [place] suggest changing to 90 day supply for pt. Spoke with Hoyle Sauer at [facility]; pt picked up 30 day supply on 07/22/14, then next refill pick up was 10/08/14, then 12/17/14, then 02/22/15 and 03/28/15. Pt last f/u appt and lab 09/14/14 and DM discussed on 04/04/15 visit. Pt scheduled for CPX on 05/27/15. Per protocol refill done # 180 x 0 until CPX.

## 2015-05-14 ENCOUNTER — Telehealth: Payer: Self-pay | Admitting: Family Medicine

## 2015-05-14 NOTE — Telephone Encounter (Signed)
Patient Name: Tony Manning DOB: [birth year] Initial Comment Caller states, lower back pain at tail bone, pinched nerve for a few days, it was getting better. Then he picked up a bucket of water, and the pain was so bad he went to the ground and started vomiting. Weakness in Rt leg. Nurse Assessment Nurse: Ronnald Ramp, RN, Miranda Date/Time (Eastern Time): 05/14/2015 2:51:45 PM Confirm and document reason for call. If symptomatic, describe symptoms. ---Caller states he has had lower back that radiates down his right leg since Friday. The pain had been getting better but then today he picked up a bucket of water and had severe pain. The pain caused him to vomit and dropped him to his knees. Has the patient traveled out of the country within the last 30 days? ---Not Applicable Does the patient require triage? ---Yes Related visit to physician within the last 2 weeks? ---No Does the PT have any chronic conditions? (i.e. diabetes, asthma, etc.) ---Yes List chronic conditions. ---Diabetes, hx of MI, High Cholesterol, HTN Guidelines Guideline Title Affirmed Question Affirmed Notes Back Pain Weakness of a leg or foot (e.g., unable to bear weight, dragging foot) Final Disposition User Go to ED Now (or PCP triage) Ronnald Ramp, RN, Miranda Comments Reinforced need to go to the ED now, caller refused and insists on making an appt for tomorrow. Told caller I would put a note on the chart and ask for a provider to review and call him. Referrals GO TO FACILITY REFUSED

## 2015-05-14 NOTE — Telephone Encounter (Signed)
Spoke to patient and was advised that he does not feel bad enough to go to the ER and have to sit there for hours. Patient stated that he will see about getting some transportation and may call the office tomorrow about getting an appointment. Patient stated that he does not have any transportation now.

## 2015-05-14 NOTE — Telephone Encounter (Signed)
If true weakness, then encourage ER.  Thanks.

## 2015-05-15 ENCOUNTER — Ambulatory Visit (INDEPENDENT_AMBULATORY_CARE_PROVIDER_SITE_OTHER): Payer: Medicare Other | Admitting: Family Medicine

## 2015-05-15 ENCOUNTER — Encounter: Payer: Self-pay | Admitting: Family Medicine

## 2015-05-15 VITALS — BP 114/74 | HR 68 | Temp 98.4°F | Wt 199.5 lb

## 2015-05-15 DIAGNOSIS — M5431 Sciatica, right side: Secondary | ICD-10-CM

## 2015-05-15 MED ORDER — HYDROCODONE-ACETAMINOPHEN 5-325 MG PO TABS: 1.0000 | ORAL_TABLET | Freq: Four times a day (QID) | ORAL | Status: DC | PRN

## 2015-05-15 MED ORDER — NITROGLYCERIN 0.4 MG SL SUBL: 0.4000 mg | SUBLINGUAL_TABLET | SUBLINGUAL | Status: DC | PRN

## 2015-05-15 MED ORDER — CYCLOBENZAPRINE HCL 10 MG PO TABS: 10.0000 mg | ORAL_TABLET | Freq: Three times a day (TID) | ORAL | Status: DC | PRN

## 2015-05-15 NOTE — Progress Notes (Signed)
Pre visit review using our clinic review tool, if applicable. No additional management support is needed unless otherwise documented below in the visit note.  Back pain started about 1 week ago.  Has been stretching, trying to get his back loose.  Using heat, with more relief than ice.  He was a little better about 2 days ago, then worse again in the meantime.  Was picking up a bucket yesterday and then and severe R lower back and leg pain.  Vomited from the pain.  This AM he was some better but then worse as the day went on.  He has twinges of pain in the L leg now.  No true weakness at any point with this. Usually he'll have a flare for a few days, then get better but this hasn't happened with this flare yet.  Taking ibuprofen and tylenol. Pain with forward flexion.   No fevers, no chills.  No rash on the lower back but has had a chronic rash on the R forearm, occ itchy, occ irritated but that seems to be a separate chronic issue.  No trauma.  No B/B sx.    He had been trying to do some home remodeling recently.    Flexeril had helped some, but not always, in the past.    Meds, vitals, and allergies reviewed.   ROS: See HPI.  Otherwise, noncontributory.  GEN: nad, alert and oriented HEENT: mucous membranes moist NECK: supple w/o LA CV: rrr. PULM: ctab, no inc wob ABD: soft, +bs  EXT: no edema SKIN: no acute rash but has a mild chronic rash on the R medial forearm w/o ulceration or breakdown.  No blistering.   Back w/o midline pain.  R lower back tender w/o rash.  R SLR positive.  Grossly with S/S wnl for the BLE.

## 2015-05-15 NOTE — Patient Instructions (Signed)
Use the flexeril, sedation caution.  Use vicodin if you have to.  Take care. Glad to see you.

## 2015-05-15 NOTE — Telephone Encounter (Signed)
Noted  

## 2015-05-16 DIAGNOSIS — M5431 Sciatica, right side: Secondary | ICD-10-CM | POA: Insufficient documentation

## 2015-05-16 NOTE — Assessment & Plan Note (Addendum)
With R SLR positive.  D/w pt.  No true weakness.  No emergent sx.   Use flexeril for now, sedation caution.  Use vicodin needed.  Routine cautions d/w pt.  Continue ibuprofen prn.  Okay for outpatient f/u.  He'll update me if not better.   He agrees with plan.  The rash on his R arm looks to be incidental, possible dry skin vs contact irritation. Can use otc hydrocortisone.  He agrees.

## 2015-05-19 ENCOUNTER — Other Ambulatory Visit: Payer: Self-pay | Admitting: Family Medicine

## 2015-05-19 DIAGNOSIS — E119 Type 2 diabetes mellitus without complications: Secondary | ICD-10-CM

## 2015-05-20 ENCOUNTER — Other Ambulatory Visit (INDEPENDENT_AMBULATORY_CARE_PROVIDER_SITE_OTHER): Payer: Medicare Other

## 2015-05-20 DIAGNOSIS — E119 Type 2 diabetes mellitus without complications: Secondary | ICD-10-CM

## 2015-05-20 LAB — COMPREHENSIVE METABOLIC PANEL
ALT: 24 U/L (ref 0–53)
AST: 19 U/L (ref 0–37)
Albumin: 4.4 g/dL (ref 3.5–5.2)
Alkaline Phosphatase: 61 U/L (ref 39–117)
BUN: 16 mg/dL (ref 6–23)
CO2: 28 mEq/L (ref 19–32)
Calcium: 9.5 mg/dL (ref 8.4–10.5)
Chloride: 104 mEq/L (ref 96–112)
Creatinine, Ser: 0.98 mg/dL (ref 0.40–1.50)
GFR: 84.34 mL/min (ref >60.00)
Glucose, Bld: 149 mg/dL — ABNORMAL HIGH (ref 70–99)
Potassium: 4 mEq/L (ref 3.5–5.1)
Sodium: 139 mEq/L (ref 135–145)
Total Bilirubin: 0.6 mg/dL (ref 0.2–1.2)
Total Protein: 7.2 g/dL (ref 6.0–8.3)

## 2015-05-20 LAB — LIPID PANEL
Cholesterol: 168 mg/dL (ref 0–200)
HDL: 41.7 mg/dL (ref >39.00)
LDL Cholesterol: 99 mg/dL (ref 0–99)
NonHDL: 126.24
Total CHOL/HDL Ratio: 4
Triglycerides: 135 mg/dL (ref 0.0–149.0)
VLDL: 27 mg/dL (ref 0.0–40.0)

## 2015-05-20 LAB — HEMOGLOBIN A1C: Hgb A1c MFr Bld: 6.5 % (ref 4.6–6.5)

## 2015-05-27 ENCOUNTER — Encounter: Payer: Medicare Other | Admitting: Family Medicine

## 2015-08-06 ENCOUNTER — Ambulatory Visit (INDEPENDENT_AMBULATORY_CARE_PROVIDER_SITE_OTHER): Payer: Medicare Other | Admitting: Family Medicine

## 2015-08-06 ENCOUNTER — Encounter: Payer: Self-pay | Admitting: Family Medicine

## 2015-08-06 VITALS — BP 122/70 | HR 71 | Temp 98.2°F | Wt 199.5 lb

## 2015-08-06 DIAGNOSIS — E119 Type 2 diabetes mellitus without complications: Secondary | ICD-10-CM

## 2015-08-06 DIAGNOSIS — M79673 Pain in unspecified foot: Secondary | ICD-10-CM | POA: Diagnosis not present

## 2015-08-06 DIAGNOSIS — M542 Cervicalgia: Secondary | ICD-10-CM

## 2015-08-06 DIAGNOSIS — E1149 Type 2 diabetes mellitus with other diabetic neurological complication: Secondary | ICD-10-CM

## 2015-08-06 DIAGNOSIS — I2510 Atherosclerotic heart disease of native coronary artery without angina pectoris: Secondary | ICD-10-CM

## 2015-08-06 MED ORDER — HYDROCODONE-ACETAMINOPHEN 5-325 MG PO TABS: 1.0000 | ORAL_TABLET | Freq: Four times a day (QID) | ORAL | Status: DC | PRN

## 2015-08-06 MED ORDER — ATORVASTATIN CALCIUM 40 MG PO TABS: 40.0000 mg | ORAL_TABLET | Freq: Every day | ORAL | Status: DC

## 2015-08-06 NOTE — Patient Instructions (Addendum)
Rosaria Ferries will call about your referral. See her on the way out.  Change from pravastatin to lipitor.  Rx sent.  If you have more aches on lipitor then let me know.  Take care.  Glad to see you.  Recheck sugar/labs in spring before a visit.

## 2015-08-06 NOTE — Progress Notes (Signed)
Pre visit review using our clinic review tool, if applicable. No additional management support is needed unless otherwise documented below in the visit note.'  He has moved his parents in his house after a remodel.  His mother is needing more care due to memory loss- she doesn't recognize family members.    DM2.  A1c at goal on last check.  Labs d/w pt.  Hasn't needed SSI for prolonged period of time, med list updated.  Sugar usually ~110-140 or lower.  Rare clinically low sugars, only if prolonged fasting.  Labs d/w pt.  Needs referral to foot clinic- he's having a lot more heel pain in spite of inserts and shoe changes.    CAD s/p stent x4.  No active CP but still with lipids above goal.  Compliant with pravastatin and other meds.  Labs d/w pt.    Wants to get second opinion from Dr. Annette Stable re: his neck.  Had seen Dr. Brent Bulla.  Referral placed.  Had used hydrocodone sparingly for flares of pain.   Meds, vitals, and allergies reviewed.   ROS: See HPI.  Otherwise, noncontributory.  GEN: nad, alert and oriented HEENT: mucous membranes moist NECK: supple w/o LA CV: rrr.  PULM: ctab, no inc wob ABD: soft, +bs EXT: no edema  Diabetic foot exam: Normal inspection No skin breakdown No calluses  Normal DP pulses Normal sensation to light touch but dec to monofilament B Nails normal

## 2015-08-07 ENCOUNTER — Encounter: Payer: Self-pay | Admitting: Family Medicine

## 2015-08-07 NOTE — Assessment & Plan Note (Signed)
Refer for second opinion. >25 minutes spent in face to face time with patient, >50% spent in counselling or coordination of care.

## 2015-08-07 NOTE — Assessment & Plan Note (Signed)
Refer

## 2015-08-07 NOTE — Assessment & Plan Note (Signed)
At goal, labs d/w pt. Continue as is, with work on diet encouraged.

## 2015-08-07 NOTE — Assessment & Plan Note (Signed)
With lipids above goal, change to lipitor.  D/w pt.  Update me as needed, if not tolerated.

## 2015-08-13 ENCOUNTER — Other Ambulatory Visit: Payer: Self-pay | Admitting: *Deleted

## 2015-08-13 MED ORDER — LOSARTAN POTASSIUM 25 MG PO TABS: 25.0000 mg | ORAL_TABLET | Freq: Every day | ORAL | Status: DC

## 2015-08-15 ENCOUNTER — Ambulatory Visit (INDEPENDENT_AMBULATORY_CARE_PROVIDER_SITE_OTHER): Payer: Medicare Other | Admitting: Podiatry

## 2015-08-15 ENCOUNTER — Encounter: Payer: Self-pay | Admitting: Podiatry

## 2015-08-15 ENCOUNTER — Ambulatory Visit (INDEPENDENT_AMBULATORY_CARE_PROVIDER_SITE_OTHER): Payer: Medicare Other

## 2015-08-15 VITALS — BP 154/101 | HR 63 | Resp 16 | Ht 70.5 in | Wt 195.0 lb

## 2015-08-15 DIAGNOSIS — M79673 Pain in unspecified foot: Secondary | ICD-10-CM

## 2015-08-15 NOTE — Progress Notes (Signed)
   Subjective:    Patient ID: Tony Manning, male    DOB: 1960-12-18, 54 y.o.   MRN: [id number]  HPI Patient presents with bilateral foot pain; heel. Pt stated, "left foot feels worse and has heel spurs"; x2 months.  Pt diabetic, sugar=139 this am; A1C=6.5; neuropathy   Review of Systems  Constitutional: Positive for activity change and appetite change.  HENT: Positive for sinus pressure.   Eyes: Positive for visual disturbance.  Musculoskeletal: Positive for myalgias, back pain, arthralgias and gait problem.  Neurological: Positive for weakness and numbness.  Hematological: Bruises/bleeds easily.  All other systems reviewed and are negative.      Objective:   Physical Exam        Assessment & Plan:

## 2015-08-15 NOTE — Patient Instructions (Signed)

## 2015-08-18 NOTE — Progress Notes (Signed)
Subjective:     Patient ID: Tony Manning, male   DOB: January 02, 1961, 54 y.o.   MRN: [id number]  HPI patient presents stating I'm having pain in both my heels with the left one hurting more than the right one and it's been difficult to walk. Patient has tried reduced activity and other modalities without relief   Review of Systems  All other systems reviewed and are negative.      Objective:   Physical Exam  Constitutional: He is oriented to person, place, and time.  Cardiovascular: Intact distal pulses.   Musculoskeletal: Normal range of motion.  Neurological: He is oriented to person, place, and time.  Skin: Skin is warm.  Nursing note and vitals reviewed.  neurovascular status intact muscle strength adequate range of motion within normal limits with patient noted to have discomfort in the plantar heel region left over right with fluid buildup. His sugar has been under good control with last A1c 6 any is noted to have moderate depression of the arch good digital perfusion and is well oriented 3     Assessment:     Inflammatory fasciitis left over right with fluid buildup around the medial bands    Plan:     H&P and x-rays reviewed with patient. Injected the plantar fascia bilateral 3 mg Kenalog 5 mg Xylocaine and applied fascial brace is to take stress off the arch is and reduced activity advised. Discussed physical therapy and shoe gear modifications and reappoint to recheck

## 2015-08-20 ENCOUNTER — Ambulatory Visit (INDEPENDENT_AMBULATORY_CARE_PROVIDER_SITE_OTHER): Payer: Medicare Other | Admitting: Family Medicine

## 2015-08-20 ENCOUNTER — Encounter: Payer: Self-pay | Admitting: Family Medicine

## 2015-08-20 VITALS — BP 120/64 | HR 70 | Temp 98.3°F | Ht 70.5 in | Wt 202.0 lb

## 2015-08-20 DIAGNOSIS — J329 Chronic sinusitis, unspecified: Secondary | ICD-10-CM | POA: Diagnosis not present

## 2015-08-20 DIAGNOSIS — B9789 Other viral agents as the cause of diseases classified elsewhere: Secondary | ICD-10-CM

## 2015-08-20 DIAGNOSIS — B349 Viral infection, unspecified: Secondary | ICD-10-CM

## 2015-08-20 MED ORDER — AMOXICILLIN 500 MG PO CAPS: 1000.0000 mg | ORAL_CAPSULE | Freq: Two times a day (BID) | ORAL | Status: DC

## 2015-08-20 NOTE — Patient Instructions (Signed)
Neety pot daily or nasal saline spray 2-3 times daily. Mucinex DM twice daily. Start flonase 2 sprays per nostril daily. If not improving in next 3-4 days, fill antibitoics. Can start antibiotics earlier if new fever > 101.41F.

## 2015-08-20 NOTE — Assessment & Plan Note (Signed)
Treat with irrigation, flonase and mucolytic. If not improving with time as reviewed on viral timeline.Venida Jarvis rx for antibiotics given.

## 2015-08-20 NOTE — Progress Notes (Signed)
Pre visit review using our clinic review tool, if applicable. No additional management support is needed unless otherwise documented below in the visit note. 

## 2015-08-20 NOTE — Progress Notes (Signed)
   Subjective:    Patient ID: Tony Manning, male    DOB: April 13, 1961, 54 y.o.   MRN: [id number]  Cough This is a new problem. The current episode started in the past 7 days. The problem has been gradually worsening. The cough is non-productive. Associated symptoms include chills, a fever, myalgias, nasal congestion, postnasal drip and a sore throat. Pertinent negatives include no shortness of breath or wheezing. Associated symptoms comments: Sinus pressure, pain, right greater than left  fever last week was 101.5, yesterday 99.1 F. The symptoms are aggravated by lying down. Risk factors for lung disease include smoking/tobacco exposure. Treatments tried:  alkaseltzer, nyquil. The treatment provided mild relief.   Social History /Family History/Past Medical History reviewed and updated if needed. Occ chews tobacco, no smoking. No COPD, asthma.  Review of Systems  Constitutional: Positive for fever and chills.  HENT: Positive for postnasal drip and sore throat.   Respiratory: Positive for cough. Negative for shortness of breath and wheezing.   Musculoskeletal: Positive for myalgias.       Objective:   Physical Exam  Constitutional: Vital signs are normal. He appears well-developed and well-nourished.  Non-toxic appearance. He does not appear ill. No distress.  HENT:  Head: Normocephalic and atraumatic.  Right Ear: Hearing, external ear and ear canal normal. No tenderness. No foreign bodies. Tympanic membrane is not retracted and not bulging. A middle ear effusion is present.  Left Ear: Hearing, external ear and ear canal normal. No tenderness. No foreign bodies. Tympanic membrane is not retracted and not bulging. A middle ear effusion is present.  Nose: Mucosal edema present. No rhinorrhea. Right sinus exhibits maxillary sinus tenderness. Right sinus exhibits no frontal sinus tenderness. Left sinus exhibits maxillary sinus tenderness. Left sinus exhibits no frontal sinus tenderness.    Mouth/Throat: Uvula is midline, oropharynx is clear and moist and mucous membranes are normal. Normal dentition. No dental caries. No oropharyngeal exudate or tonsillar abscesses.  Eyes: Conjunctivae, EOM and lids are normal. Pupils are equal, round, and reactive to light. Lids are everted and swept, no foreign bodies found.  Neck: Trachea normal, normal range of motion and phonation normal. Neck supple. Carotid bruit is not present. No thyroid mass and no thyromegaly present.  Cardiovascular: Normal rate, regular rhythm, S1 normal, S2 normal, normal heart sounds, intact distal pulses and normal pulses.  Exam reveals no gallop.   No murmur heard. Pulmonary/Chest: Effort normal and breath sounds normal. No respiratory distress. He has no wheezes. He has no rhonchi. He has no rales.  Abdominal: Soft. Normal appearance and bowel sounds are normal. There is no hepatosplenomegaly. There is no tenderness. There is no rebound, no guarding and no CVA tenderness. No hernia.  Neurological: He is alert. He has normal reflexes.  Skin: Skin is warm, dry and intact. No rash noted.  Psychiatric: He has a normal mood and affect. His speech is normal and behavior is normal. Judgment normal.          Assessment & Plan:

## 2015-08-23 ENCOUNTER — Ambulatory Visit: Payer: Medicare Other | Admitting: Podiatry

## 2015-09-19 ENCOUNTER — Ambulatory Visit: Payer: Medicare Other | Admitting: Podiatry

## 2015-10-10 ENCOUNTER — Encounter: Payer: Self-pay | Admitting: Podiatry

## 2015-10-10 ENCOUNTER — Ambulatory Visit (INDEPENDENT_AMBULATORY_CARE_PROVIDER_SITE_OTHER): Payer: Medicare Other | Admitting: Podiatry

## 2015-10-10 VITALS — BP 134/82 | HR 65 | Resp 16

## 2015-10-10 DIAGNOSIS — L84 Corns and callosities: Secondary | ICD-10-CM | POA: Diagnosis not present

## 2015-10-10 DIAGNOSIS — M2040 Other hammer toe(s) (acquired), unspecified foot: Secondary | ICD-10-CM | POA: Diagnosis not present

## 2015-10-10 DIAGNOSIS — M722 Plantar fascial fibromatosis: Secondary | ICD-10-CM | POA: Diagnosis not present

## 2015-10-10 DIAGNOSIS — E1149 Type 2 diabetes mellitus with other diabetic neurological complication: Secondary | ICD-10-CM | POA: Diagnosis not present

## 2015-10-10 NOTE — Patient Instructions (Signed)

## 2015-10-10 NOTE — Progress Notes (Signed)
Patient ID: [name], male   DOB: 02/06/1961, 55 y.o.   MRN: [id number]  Subjective: Tony Manning presents to the office today for evaluation of reoccurring left heel pain. He was last seen in December with Dr. Paulla Manning. He states as he is on his feet more the heel hurts more. He states it aches in the morning when he first gets up.  No numbness or tingling outside of his normal neuropathy. Denies any swelling or redness. The shot did help for a few days after last appointment. The brace did not help. The exercises have not helped.   His blood sugar was 150 yesterday, A1C 6.5. He had diabetic shoes previously and asking for a new pair.   No other complaints at this time. No acute changes since last appointment. They deny any systemic complaints such as fevers, chills, nausea, vomiting.  Objective: General: AAO x3, NAD  Dermatological: Small corn on the dorsal 4th toes over the PIPJ. No edema, erythema, drainage or pus. No signs of infection.  Vascular: Dorsalis Pedis artery and Posterior Tibial artery pedal pulses are 2/4 bilateral with immedate capillary fill time. Pedal hair growth present. There is no pain with calf compression, swelling, warmth, erythema.   Neruologic: Grossly intact via light touch bilateral. Vibratory intact via tuning fork bilateral. Protective threshold with Semmes Wienstein monofilament intact to all pedal sites bilateral.   Musculoskeletal: There is  tenderness palpation just distal to the plantar medial tubercle of the calcaneus at the insertion of the plantar fascia on the left foot. There is pain along the course of the plantar fascia within the arch of the foot. Plantar fascia appears to be intact bilaterally. There is no pain with lateral compression of the calcaneus and there is no pain with vibratory sensation. There is no pain along the course or insertion of the Achilles tendon. There are no other areas of tenderness to bilateral lower extremities.  No gross boney pedal deformities bilateral. No pain, crepitus, or limitation noted with foot and ankle range of motion bilateral. Muscular strength 5/5 in all groups tested bilateral. Hammertoes are present bilaterally.  Gait: Unassisted, Nonantalgic.   Assessment: Presents for follow-up evaluation for heel pain, likely plantar fasciitis; type II diabetic with neuropathy  Plan: -Treatment options discussed including all alternatives, risks, and complications -Patient elects to proceed with steroid injection into the left heel. Under sterile skin preparation, a total of 2.5cc of kenalog 10, 0.5% Marcaine plain, and 2% lidocaine plain were infiltrated into the symptomatic area without complication. A band-aid was applied. Patient tolerated the injection well without complication. Post-injection care with discussed with the patient. Discussed with the patient to ice the area over the next couple of days to help prevent a steroid flare.  -Dispensed night splint. Continued plantar fascial brace. -Ice and stretching exercises on a daily basis. -Continue supportive shoe gear. -Ordered compound cream for anti-inflammatory -Completed paperwork take her precertification for diabetic shoes. -Follow-up in 3 weeks or sooner if any problems arise. In the meantime, encouraged to call the office with any questions, concerns, change in symptoms.   Tony Manning, DPM

## 2015-10-16 ENCOUNTER — Telehealth: Payer: Self-pay | Admitting: Family Medicine

## 2015-10-16 NOTE — Telephone Encounter (Signed)
Patient Name: Tony Manning  DOB: 03-30-1961    Initial Comment Caller states has head pressure, chest congestion and pressure, no fever   Nurse Assessment  Nurse: Leilani Merl, RN, Heather Date/Time (Eastern Time): 10/16/2015 10:55:20 AM  Confirm and document reason for call. If symptomatic, describe symptoms. You must click the next button to save text entered. ---Caller states has head pressure, chest congestion and pressure, no fever. It started 2 days ago  Has the patient traveled out of the country within the last 30 days? ---Not Applicable  Does the patient have any new or worsening symptoms? ---Yes  Will a triage be completed? ---Yes  Related visit to physician within the last 2 weeks? ---No  Does the PT have any chronic conditions? (i.e. diabetes, asthma, etc.) ---Yes  List chronic conditions. ---see mr  Is this a behavioral health or substance abuse call? ---No     Guidelines    Guideline Title Affirmed Question Affirmed Notes  Cough - Acute Non-Productive SEVERE coughing spells (e.g., whooping sound after coughing, vomiting after coughing)    Final Disposition User   See Physician within 24 Hours Standifer, RN, Water quality scientist    Referrals  REFERRED TO PCP OFFICE   Disagree/Comply: Comply

## 2015-10-16 NOTE — Telephone Encounter (Signed)
Pt has appt 10/17/15 at 8:45 with Avie Echevaria NP.

## 2015-10-17 ENCOUNTER — Ambulatory Visit (INDEPENDENT_AMBULATORY_CARE_PROVIDER_SITE_OTHER): Payer: Medicare Other | Admitting: Internal Medicine

## 2015-10-17 ENCOUNTER — Encounter: Payer: Self-pay | Admitting: Internal Medicine

## 2015-10-17 ENCOUNTER — Ambulatory Visit: Payer: Medicare Other | Admitting: Primary Care

## 2015-10-17 VITALS — BP 124/76 | HR 74 | Temp 99.0°F | Wt 206.0 lb

## 2015-10-17 DIAGNOSIS — R05 Cough: Secondary | ICD-10-CM

## 2015-10-17 DIAGNOSIS — J111 Influenza due to unidentified influenza virus with other respiratory manifestations: Secondary | ICD-10-CM | POA: Diagnosis not present

## 2015-10-17 DIAGNOSIS — R509 Fever, unspecified: Secondary | ICD-10-CM

## 2015-10-17 DIAGNOSIS — R059 Cough, unspecified: Secondary | ICD-10-CM

## 2015-10-17 DIAGNOSIS — R0989 Other specified symptoms and signs involving the circulatory and respiratory systems: Secondary | ICD-10-CM | POA: Diagnosis not present

## 2015-10-17 LAB — POCT INFLUENZA A/B: Influenza A, POC: POSITIVE — AB

## 2015-10-17 MED ORDER — OSELTAMIVIR PHOSPHATE 75 MG PO CAPS: 75.0000 mg | ORAL_CAPSULE | Freq: Two times a day (BID) | ORAL | Status: DC

## 2015-10-17 MED ORDER — HYDROCODONE-HOMATROPINE 5-1.5 MG/5ML PO SYRP: 5.0000 mL | ORAL_SOLUTION | Freq: Three times a day (TID) | ORAL | Status: DC | PRN

## 2015-10-17 NOTE — Patient Instructions (Signed)

## 2015-10-17 NOTE — Addendum Note (Signed)
Addended by: Lurlean Nanny on: 10/17/2015 01:34 PM   Modules accepted: Orders

## 2015-10-17 NOTE — Progress Notes (Signed)
Pre visit review using our clinic review tool, if applicable. No additional management support is needed unless otherwise documented below in the visit note. 

## 2015-10-17 NOTE — Progress Notes (Signed)
HPI  Pt presents to the clinic today with c/o runny nose, cough and chest congestion. This started 2 days ago. He is blowing clear mucous out of his nose. The cough is nonproductive. He does report chest tightness but denies chest pain or shortness of breath. He has run fevers up to 102. He reports associated chills or body aches. He has tried Mucinex and Nyquil with minimal relief. He has no history of seasonal allergies or breathing problems. He does have DM 2, last A1C was 6.6%. He is UTD on flu and pneumonia vaccines. He has not had sick contacts that he is aware of. He was seen 08/20/15 for a viral sinusitis.  Review of Systems      Past Medical History  Diagnosis Date  . Essential hypertension, benign   . Postsurgical percutaneous transluminal coronary angioplasty status     s/p stent x4  . Coronary atherosclerosis of native coronary artery     s/p stent x4  . Chest pain, unspecified   . Diabetes mellitus without mention of complication   . Unspecified sleep apnea   . Depression   . History of nephrolithiasis   . Migraine   . Osteoarthritis   . Basal cell carcinoma of face   . Anginal pain ([place])   . Heart murmur   . Sarcoidosis ([place])     Family History  Problem Relation Age of Onset  . Arthritis Mother   . Depression Mother   . Stroke Mother   . Arthritis Father   . Diabetes Father   . Coronary artery disease Father   . Colon cancer Neg Hx   . Prostate cancer Neg Hx   . Esophageal cancer Neg Hx   . Rectal cancer Neg Hx   . Stomach cancer Neg Hx     Social History   Social History  . Marital Status: Married    Spouse Name: N/A  . Number of Children: 2  . Years of Education: N/A   Occupational History  . disabled    Social History Main Topics  . Smoking status: Never Smoker   . Smokeless tobacco: Current User    Types: Chew     Comment: trying to quit  . Alcohol Use: No  . Drug Use: No  . Sexual Activity: Yes    Birth Control/ Protection: Condom    Other Topics Concern  . Not on file   Social History Narrative   Married, 1987   2 kids   On disability    Allergies  Allergen Reactions  . Gabapentin Other (See Comments)    REACTION: lack of effect for pain  . Lisinopril Cough  . Tramadol     diarrhea     Constitutional: Positive fatigue and fever. Denies headache, abrupt weight changes.  HEENT:  Positive runny nose. Denies eye redness, eye pain, pressure behind the eyes, facial pain, nasal congestion, ear pain, ringing in the ears, wax buildup or sore throat. Respiratory: Positive cough. Denies difficulty breathing or shortness of breath.  Cardiovascular: Positive chest tightness. Denies chest pain, palpitations or swelling in the hands or feet.   No other specific complaints in a complete review of systems (except as listed in HPI above).  Objective:  BP 124/76 mmHg  Pulse 74  Temp(Src) 99 F (37.2 C) (Oral)  Wt 206 lb (93.441 kg)  SpO2 97%  Wt Readings from Last 3 Encounters:  10/17/15 206 lb (93.441 kg)  08/20/15 202 lb (91.627 kg)  08/15/15 195 lb (  88.451 kg)     General: Appears his stated age, ill appearing, in NAD. HEENT: Head: normal shape and size, no sinus tenderness noted; Eyes: sclera white, no icterus, conjunctiva pink; Ears: Tm's pink but intact, normal light reflex; Nose: mucosa pink and moist, septum midline; Throat/Mouth: Teeth present, mucosa pink and moist, no exudate noted, no lesions or ulcerations noted.  Neck: No cervical lymphadenopathy.  Cardiovascular: Normal rate and rhythm. S1,S2 noted.  No murmur, rubs or gallops noted.  Pulmonary/Chest: Normal effort and diminished vesicular breath sounds. No respiratory distress. No wheezes, rales or ronchi noted.      Assessment & Plan:   Fever, cough and chest congestion:  Rapid flu: positive Get some rest and drink plenty of water Tylenol or Ibuprofen for fever and body aches eRx for Tamiflu 75 mg BID x 5 days Rx for Hycodan cough  syrup  RTC as needed or if symptoms persist.

## 2015-10-18 ENCOUNTER — Telehealth: Payer: Self-pay

## 2015-10-18 NOTE — Telephone Encounter (Signed)
This is Dr. [name] pt. I saw him for an acute problem I will route to PCP

## 2015-10-18 NOTE — Telephone Encounter (Signed)
Vicky nurse with [facility] left v/m; concerned that pt is not getting med filled for BP,diabetes and cholesterol; vicky unable to reach pt by phone and wanted PCP to be aware so could address at next appt. Vicky does not require cb. Pt cancelled CPX on 05/27/15 due to transportation problem and no future appt scheduled. Unable to reach pt by phone.

## 2015-10-18 NOTE — Telephone Encounter (Signed)
Noted. Thanks.

## 2015-10-18 NOTE — Telephone Encounter (Signed)
Patient notified as instructed by telephone and verbalized understanding. Patient stated that he is taking his medication everyday. Follow-up appointment scheduled for March as instructed.

## 2015-10-18 NOTE — Telephone Encounter (Signed)
Patient seen recently.  22- Was he complaint with meds? Sharol Harness- needs DM2 f/u in 11/2015.   Thanks.

## 2015-10-31 ENCOUNTER — Ambulatory Visit (INDEPENDENT_AMBULATORY_CARE_PROVIDER_SITE_OTHER): Payer: Medicare Other | Admitting: Podiatry

## 2015-10-31 ENCOUNTER — Encounter: Payer: Self-pay | Admitting: Podiatry

## 2015-10-31 VITALS — BP 134/88 | HR 61 | Resp 18

## 2015-10-31 DIAGNOSIS — E1149 Type 2 diabetes mellitus with other diabetic neurological complication: Secondary | ICD-10-CM

## 2015-10-31 DIAGNOSIS — M722 Plantar fascial fibromatosis: Secondary | ICD-10-CM | POA: Diagnosis not present

## 2015-10-31 NOTE — Progress Notes (Signed)
Patient ID: Tony Manning, male   DOB: 12-Oct-1960, 55 y.o.   MRN: [id number]  Subjective: Tony Manning presents to the office today for evaluation of reoccurring left heel pain. He states he is continuing to have pain to his left heel. He has been stretching icing intermittently when the placement intermittently been on a consistent basis. He states he does the biggest problem is his shoes and he did not get new shoes. He is scheduled to get new diabetic shoes fitted next week. No other complaints at this time.  No other complaints at this time. No acute changes since last appointment. They deny any systemic complaints such as fevers, chills, nausea, vomiting.  Objective: General: AAO x3, NAD  Dermatological: Small corn on the dorsal 4th toes over the PIPJ. No edema, erythema, drainage or pus. No signs of infection.  Vascular: Dorsalis Pedis artery and Posterior Tibial artery pedal pulses are 2/4 bilateral with immedate capillary fill time. Pedal hair growth present. There is no pain with calf compression, swelling, warmth, erythema.   Neruologic: Sensation somewhat decreased with Derrel Nip monofilament.  Orthopedic: There is continued tenderness to palpation over the medial tubercle of the calcaneus at the insertion of the plantar fascia on the left foot. There is no pain along the course of the plantar fascia within the arch of the foot today. Plantar fascia appears to be intact bilaterally. There is no pain with lateral compression of the calcaneus and there is no pain with vibratory sensation. There is no pain along the course or insertion of the Achilles tendon. There are no other areas of tenderness to bilateral lower extremities. No gross boney pedal deformities bilateral. No pain, crepitus, or limitation noted with foot and ankle range of motion bilateral. Muscular strength 5/5 in all groups tested bilateral. Hammertoes are present bilaterally.  Gait: Unassisted,  Nonantalgic.   Assessment: Presents for follow-up evaluation for heel pain, likely plantar fasciitis; type II diabetic with neuropathy  Plan: -Treatment options discussed including all alternatives, risks, and complications -I discussed on a steroid injection he wishes to hold off as the last did not help.  -In a difficult situation other do not lead to anti-inflammatories given his cardiac issues. He is also diabetic so I would like to hold off on oral steroids of possible. I discussed physical therapy. He does not want to go due to cost. -He has a cam boot at home and I recommended to go into the CAM boot until we can get new shoes or until symptoms resolve. -He is scheduled for diabetic shoe fitting next week. -Follow-up after diabetic shoes or sooner if any problems arise. In the meantime, encouraged to call the office with any questions, concerns, change in symptoms.   Celesta Gentile, DPM

## 2015-11-05 ENCOUNTER — Other Ambulatory Visit: Payer: Self-pay | Admitting: *Deleted

## 2015-11-05 MED ORDER — METFORMIN HCL 1000 MG PO TABS: ORAL_TABLET | ORAL | Status: DC

## 2015-11-06 ENCOUNTER — Ambulatory Visit (INDEPENDENT_AMBULATORY_CARE_PROVIDER_SITE_OTHER): Payer: Medicare Other | Admitting: *Deleted

## 2015-11-06 DIAGNOSIS — E1149 Type 2 diabetes mellitus with other diabetic neurological complication: Secondary | ICD-10-CM

## 2015-11-06 DIAGNOSIS — L84 Corns and callosities: Secondary | ICD-10-CM

## 2015-11-06 DIAGNOSIS — M2040 Other hammer toe(s) (acquired), unspecified foot: Secondary | ICD-10-CM

## 2015-11-06 NOTE — Progress Notes (Signed)
Measured for diabetic shoes and insoles. 

## 2015-11-15 ENCOUNTER — Ambulatory Visit (INDEPENDENT_AMBULATORY_CARE_PROVIDER_SITE_OTHER): Payer: Medicare Other | Admitting: Family Medicine

## 2015-11-15 ENCOUNTER — Encounter: Payer: Self-pay | Admitting: Family Medicine

## 2015-11-15 ENCOUNTER — Other Ambulatory Visit: Payer: Self-pay | Admitting: Family Medicine

## 2015-11-15 VITALS — BP 102/70 | HR 62 | Temp 98.4°F | Wt 209.5 lb

## 2015-11-15 DIAGNOSIS — E119 Type 2 diabetes mellitus without complications: Secondary | ICD-10-CM | POA: Diagnosis not present

## 2015-11-15 DIAGNOSIS — Z119 Encounter for screening for infectious and parasitic diseases, unspecified: Secondary | ICD-10-CM

## 2015-11-15 DIAGNOSIS — E1149 Type 2 diabetes mellitus with other diabetic neurological complication: Secondary | ICD-10-CM | POA: Diagnosis not present

## 2015-11-15 LAB — HEMOGLOBIN A1C: Hgb A1c MFr Bld: 7.3 % — ABNORMAL HIGH (ref 4.6–6.5)

## 2015-11-15 NOTE — Patient Instructions (Addendum)
Go to the lab on the way out.  We'll contact you with your lab report. Recheck in about 6 months, with a physical.   Try to work on your weight in the meantime.

## 2015-11-15 NOTE — Progress Notes (Signed)
Pre visit review using our clinic review tool, if applicable. No additional management support is needed unless otherwise documented below in the visit note.  I offered condolences re: the death of his mother.  It was an expected death as she was in poor health.  He is over his flu from 10/2015.    Less nausea with changes in GERD tx.    Pt opts in for HCV and HIV screening.  D/w pt re: routine screening.    He was asking about low T, ED and fatigue.  Not an early AM sample.  We can consider recheck later on.  D/w pt.    Diabetes:  Using medications without difficulties:yes, ~22 units per day Hypoglycemic episodes:no Hyperglycemic episodes:no Feet problems: still painful, at baseline Blood Sugars averaging: usually ~120s, occ higher eye exam within last year: yes Due for A1c.    Meds, vitals, and allergies reviewed.   ROS: See HPI.  Otherwise negative.    GEN: nad, alert and oriented HEENT: mucous membranes moist NECK: supple w/o LA CV: rrr. PULM: ctab, no inc wob ABD: soft, +bs EXT: no edema SKIN: no acute rash

## 2015-11-16 LAB — HIV ANTIBODY (ROUTINE TESTING W REFLEX): HIV 1&2 Ab, 4th Generation: NONREACTIVE

## 2015-11-16 LAB — HEPATITIS C ANTIBODY: HCV Ab: NEGATIVE

## 2015-11-18 NOTE — Assessment & Plan Note (Signed)
See notes on labs.  He was expecting his A1c to be up some.   D/w pt about diet and exercise as tolerated with insulin titration if sugars continue to be elevated.  Recheck in about 6 months, with a physical.  He agrees with plan, see notes on labs.

## 2015-12-03 ENCOUNTER — Other Ambulatory Visit: Payer: Self-pay | Admitting: *Deleted

## 2015-12-03 MED ORDER — INSULIN DETEMIR 100 UNIT/ML FLEXPEN: 20.0000 [IU] | PEN_INJECTOR | Freq: Every morning | SUBCUTANEOUS | Status: DC

## 2015-12-11 ENCOUNTER — Telehealth: Payer: Self-pay

## 2015-12-11 NOTE — Telephone Encounter (Signed)
Tony Manning with [facility] left v/m requesting zostavax or shingles vaccine order sent to [facility]; pt is interested in getting the shingles vaccine. Tony Manning thinks this was discussed at last visit, 11/15/15.Please advise.

## 2015-12-13 MED ORDER — ZOSTER VACCINE LIVE 19400 UNT/0.65ML ~~LOC~~ SOLR: 0.6500 mL | Freq: Once | SUBCUTANEOUS | Status: DC

## 2015-12-13 NOTE — Telephone Encounter (Signed)
rx sent.  Thanks.  

## 2015-12-26 ENCOUNTER — Encounter: Payer: Self-pay | Admitting: Podiatry

## 2015-12-26 ENCOUNTER — Ambulatory Visit (INDEPENDENT_AMBULATORY_CARE_PROVIDER_SITE_OTHER): Payer: Medicare Other | Admitting: Podiatry

## 2015-12-26 VITALS — BP 166/93 | HR 64 | Resp 18

## 2015-12-26 DIAGNOSIS — M722 Plantar fascial fibromatosis: Secondary | ICD-10-CM

## 2015-12-26 DIAGNOSIS — L84 Corns and callosities: Secondary | ICD-10-CM

## 2015-12-26 DIAGNOSIS — E1149 Type 2 diabetes mellitus with other diabetic neurological complication: Secondary | ICD-10-CM

## 2015-12-26 DIAGNOSIS — M2040 Other hammer toe(s) (acquired), unspecified foot: Secondary | ICD-10-CM | POA: Diagnosis not present

## 2015-12-26 NOTE — Progress Notes (Signed)
Patient ID: Tony Manning, male   DOB: [birth year], 55 y.o.   MRN: [id number]  Patient presents to the office today to pick up diabetic shoes. One pair of shoes and 3 pairs of custom inserts were dispensed. Break in instructions discussed. Monitor feet daily for any issues or any skin irritation or breakdown. He had no acute changes and no new concerns today. Follow-up in 4 weeks.   Celesta Gentile, DPM

## 2016-01-09 ENCOUNTER — Encounter: Payer: Self-pay | Admitting: Podiatry

## 2016-01-09 ENCOUNTER — Ambulatory Visit (INDEPENDENT_AMBULATORY_CARE_PROVIDER_SITE_OTHER): Payer: Medicare Other | Admitting: Podiatry

## 2016-01-09 VITALS — BP 123/71 | HR 76 | Resp 18

## 2016-01-09 DIAGNOSIS — M722 Plantar fascial fibromatosis: Secondary | ICD-10-CM

## 2016-01-10 NOTE — Progress Notes (Signed)
Patient ID: Tony Manning, male   DOB: 12/08/1960, 55 y.o.   MRN: [id number]  Subjective: ARAN COWENS presents to the office today for evaluation of reoccurring left heel pain. He states that he has been stretching and icing. He is also limited diabetic shoes with inserts to help some but he still can use it pain on a daily basis to his heels. He also has neuropathy. Denies any claudication symptoms. No other complaints at this time. No acute changes since last appointment. They deny any systemic complaints such as fevers, chills, nausea, vomiting.  Objective: General: AAO x3, NAD  Dermatological: Small corn on the dorsal 4th toes over the PIPJ. No edema, erythema, drainage or pus. No signs of infection.  Vascular: Dorsalis Pedis artery and Posterior Tibial artery pedal pulses are 2/4 bilateral with immedate capillary fill time. Pedal hair growth present. There is no pain with calf compression, swelling, warmth, erythema.   Neruologic: Sensation decreased with Derrel Nip monofilament.  Orthopedic: There is continued tenderness to palpation over the medial tubercle of the calcaneus at the insertion of the plantar fascia on the left foot and appears to be mostly unchanged. There is no pain along the course of the plantar fascia within the arch of the foot today. Plantar fascia appears to be intact bilaterally. There is no pain with lateral compression of the calcaneus and there is no pain with vibratory sensation. There is no pain along the course or insertion of the Achilles tendon. There are no other areas of tenderness to bilateral lower extremities. No gross boney pedal deformities bilateral. No pain, crepitus, or limitation noted with foot and ankle range of motion bilateral. Muscular strength 5/5 in all groups tested bilateral. Hammertoes are present bilaterally.  Gait: Unassisted, Nonantalgic.   Assessment: Presents for follow-up evaluation for heel pain, likely plantar  fasciitis; type II diabetic with neuropathy  Plan: -Treatment options discussed including all alternatives, risks, and complications -He was stable any further steroid injection. I discussed the multiple treatment options including physical therapy, surgical intervention, EPAT. I would like to hold off on surgery if possible. He states that physical therapy is to expensive I discussed with him EPAT and he would like to proceed with this. No promises or guarantees given as the outcome. -He underwent his first treatment to the any complications. He had settings the arch of 2.5, 1000, 12 and in the left heel settings of 3, 3000, 21. -Continue stretching as well as supportive shoe gear. Cam boot if needed. -Follow-up in 1 week for treatment #2.  Celesta Gentile, DPM

## 2016-01-15 ENCOUNTER — Telehealth: Payer: Self-pay | Admitting: Podiatry

## 2016-01-15 NOTE — Telephone Encounter (Signed)
Patient called today, said he came by the office yesterday and that the nurse was supposed to call him back to let him know if he needs to keep his appointment tomorrow for his EPAT.  He said he is in so much pain right now that he doesn't think he can do the EPAT. Wanted a nurse to call him. I did  Not cancel his appt for tomorrow with Dr. Jacqualyn Posey because he thinks he needs an injection for pain or something else before he can complete the EPAT. Please call him to discuss further treatments.

## 2016-01-15 NOTE — Telephone Encounter (Signed)
We can discuss treatments tomorrow when he comes in. We do not have to do EPAT

## 2016-01-16 ENCOUNTER — Ambulatory Visit (INDEPENDENT_AMBULATORY_CARE_PROVIDER_SITE_OTHER): Payer: Medicare Other | Admitting: Podiatry

## 2016-01-16 ENCOUNTER — Encounter: Payer: Self-pay | Admitting: Podiatry

## 2016-01-16 DIAGNOSIS — M722 Plantar fascial fibromatosis: Secondary | ICD-10-CM | POA: Diagnosis not present

## 2016-01-16 NOTE — Progress Notes (Signed)
Patient ID: Tony Manning, male   DOB: July 02, 1961, 55 y.o.   MRN: [id number]  Subjective: 55 year old male presents the office they for follow-up evaluation of left heel pain. After the last reaming he did have some increased discomfort the heel of the foot however this has improved over the last couple of days. He presents today for second treatment for EPAT.  Denies any systemic complaints such as fevers, chills, nausea, vomiting. No acute changes since last appointment, and no other complaints at this time.   Objective: AAO x3, NAD DP/PT pulses palpable bilaterally, CRT less than 3 seconds Protective sensation decreased with Simms Weinstein monofilament There is continuation tenderness on the plantar medial tubercle of the calcaneus insert apply fashion left heel on the medial band of plantar fascia the arch of the foot there is no pain. The plantar fascia appears intact. No pain with medial to lateral compression. No pain on the right side. No other areas of tenderness bilaterally No areas of pinpoint bony tenderness or pain with vibratory sensation. MMT 5/5, ROM WNL. No edema, erythema, increase in warmth to bilateral lower extremities.  No open lesions or pre-ulcerative lesions.  No pain with calf compression, swelling, warmth, erythema  Assessment: Continue left heel pain likely plantar fasciitis/heel spur.  Plan: -All treatment options discussed with the patient including all alternatives, risks, complications.  -Discussed the patient both conservative and surgical treatment options. After long discussion the patient has opted to proceed with a second treatment of EPAT. Hammertoe with second treatments without complications with settings of the right arch of 3.1, 1000, 10 the left heel 5, 10, 3000. -Recommended to wear cam boot. Dispensed short cam boot today. -Stretching -Follow-up one week -Patient encouraged to call the office with any questions, concerns, change in symptoms.    Celesta Gentile, DPM

## 2016-01-16 NOTE — Telephone Encounter (Signed)
Patient came in today to see Dr Jacqualyn Posey and we did the epat #2 and went over all questions the patient had. Lattie Haw

## 2016-01-22 ENCOUNTER — Telehealth: Payer: Self-pay

## 2016-01-22 NOTE — Telephone Encounter (Signed)
Pt is my list for [employer] 2017 and may be a good candidate for an AWV with our health coach.

## 2016-01-23 ENCOUNTER — Ambulatory Visit (INDEPENDENT_AMBULATORY_CARE_PROVIDER_SITE_OTHER): Payer: Medicare Other | Admitting: Podiatry

## 2016-01-23 ENCOUNTER — Encounter: Payer: Self-pay | Admitting: Podiatry

## 2016-01-23 ENCOUNTER — Telehealth: Payer: Self-pay | Admitting: *Deleted

## 2016-01-23 ENCOUNTER — Ambulatory Visit: Payer: Medicare Other | Admitting: Podiatry

## 2016-01-23 ENCOUNTER — Other Ambulatory Visit: Payer: Self-pay | Admitting: *Deleted

## 2016-01-23 VITALS — BP 104/66 | HR 65 | Resp 18

## 2016-01-23 DIAGNOSIS — M722 Plantar fascial fibromatosis: Secondary | ICD-10-CM

## 2016-01-23 MED ORDER — METFORMIN HCL 1000 MG PO TABS: ORAL_TABLET | ORAL | Status: DC

## 2016-01-23 MED ORDER — HYDROCODONE-ACETAMINOPHEN 5-325 MG PO TABS: 1.0000 | ORAL_TABLET | Freq: Four times a day (QID) | ORAL | Status: DC | PRN

## 2016-01-23 NOTE — Telephone Encounter (Addendum)
-----   Message from Trula Slade, DPM sent at 01/23/2016  8:29 AM EDT ----- Can you order an MRI of the left ankle to rule out plantar fascial tear and tendonitis of ankle tendons medial and lateral. Orders to [place] for [employer]. 01/24/2016-[facility] DOES NOT REQUIRED PRE-CERT FOR MRI LEFT ANKLE, REFERENCE # [id number].  Faxed to [facility].  01/24/2016-Pt states [facility] is scheduling out to 02/10/2016, and he would like to schedule in [place] if earlier appt.  I informed pt the [facility] is scheduling as early as 02/01/2016. Pt states he would like to switch to [facility].  I spoke with [facility] Scheduling and she cancelled 02/10/2016 [facility] appt.  I rescheduled with [facility] and faxed orders.  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  Orders in and faxed to [facility].  02/13/2016-DrJacqualyn Posey states MRI shows partial tear of plantar fascia, orders remain in the boot.  Left message to call for results on mobile phone. Informed pt of the MRI results, and Dr. Leigh Aurora order to go back in to CAM boot.  I offered pt an earlier appt and he refused, states he'll go back in to the boot after he mows his lawn.  I asked if he could delay the mowing or get someone else to do and he said no.

## 2016-01-23 NOTE — Telephone Encounter (Signed)
I didn't think this was recent. Please clarify with patient.  Thanks.

## 2016-01-23 NOTE — Telephone Encounter (Signed)
Faxed refill request.  It looks like he has not been on Glimepiride other than back in 2015.  Was this recently added at hospital?  Please advise.

## 2016-01-23 NOTE — Progress Notes (Signed)
Patient ID: Tony Manning, male   DOB: 05-04-1961, 55 y.o.   MRN: [id number]  Subjective: 55 year old male presents the office they for follow-up evaluation of left foot pain, heel. He stated that despite treatment he is continued have worsening pain to the bottom of his heel. He is tried wearing the cam boot although he wears a regular shoe today. He states that the angles of started to bother him on the left side on both sides the left ankle. Denies any recent injury or trauma. No swelling or redness. Denies any systemic complaints such as fevers, chills, nausea, vomiting. No acute changes since last appointment, and no other complaints at this time.   Objective: AAO x3, NAD DP/PT pulses palpable bilaterally, CRT less than 3 seconds Negative Tinel sign. Discontinuation tenderness on the plantar medial tubercle of the calcaneus at the insertion upon fashion the left side. There is no pain on the course of plantar fascial in the arch of the foot. There is mild diffuse tenderness on on the tendons on both the medial and lateral aspect of the ankle. There is no swelling. Pinpoint bony tenderness is no pain vibratory sensation. No overlying edema, erythema, increase in warmth. No areas of pinpoint bony tenderness or pain with vibratory sensation. MMT 5/5, ROM WNL. No edema, erythema, increase in warmth to bilateral lower extremities.  No open lesions or pre-ulcerative lesions.  No pain with calf compression, swelling, warmth, erythema  Assessment: Chronic ongoing left heel pain, plantar fasciitis however rule out tear with compensatory tendinitis  Plan: -All treatment options discussed with the patient including all alternatives, risks, complications.  -At this point given multiple conservative chills without any resolution I recommended MRI to rule out plantar fascial tear versus other pathology. There is a borders today. Now continue the cam boot ice to the area. We'll discuss further  treatment pending MRI.  -Patient encouraged to call the office with any questions, concerns, change in symptoms.   Celesta Gentile, DPM

## 2016-01-24 ENCOUNTER — Other Ambulatory Visit: Payer: Self-pay | Admitting: Podiatry

## 2016-01-24 DIAGNOSIS — Z139 Encounter for screening, unspecified: Secondary | ICD-10-CM

## 2016-01-24 NOTE — Telephone Encounter (Signed)
Patient says he has been taking it all along and had it refilled back in February.  I don't know why it is not showing a recent refill in our records.  Please advise.

## 2016-01-26 MED ORDER — GLIMEPIRIDE 4 MG PO TABS: 4.0000 mg | ORAL_TABLET | Freq: Two times a day (BID) | ORAL | Status: DC

## 2016-01-26 NOTE — Telephone Encounter (Signed)
Sent. Thanks.   

## 2016-02-04 ENCOUNTER — Other Ambulatory Visit: Payer: Medicare Other

## 2016-02-04 ENCOUNTER — Inpatient Hospital Stay
Admission: RE | Admit: 2016-02-04 | Discharge: 2016-02-04 | Disposition: A | Discharge status: Home / Self Care | Payer: Medicare Other | Source: Ambulatory Visit | Attending: Podiatry | Admitting: Podiatry

## 2016-02-06 ENCOUNTER — Ambulatory Visit (INDEPENDENT_AMBULATORY_CARE_PROVIDER_SITE_OTHER): Payer: Medicare Other | Admitting: Family Medicine

## 2016-02-06 ENCOUNTER — Encounter: Payer: Self-pay | Admitting: Family Medicine

## 2016-02-06 VITALS — BP 106/60 | HR 70 | Temp 97.8°F | Wt 213.8 lb

## 2016-02-06 DIAGNOSIS — M5431 Sciatica, right side: Secondary | ICD-10-CM | POA: Diagnosis not present

## 2016-02-06 DIAGNOSIS — M5430 Sciatica, unspecified side: Secondary | ICD-10-CM | POA: Insufficient documentation

## 2016-02-06 DIAGNOSIS — Z720 Tobacco use: Secondary | ICD-10-CM

## 2016-02-06 MED ORDER — VARENICLINE TARTRATE 1 MG PO TABS: 1.0000 mg | ORAL_TABLET | Freq: Two times a day (BID) | ORAL | Status: DC

## 2016-02-06 MED ORDER — VARENICLINE TARTRATE 0.5 MG X 11 & 1 MG X 42 PO MISC: ORAL | Status: DC

## 2016-02-06 MED ORDER — PREDNISONE 20 MG PO TABS: ORAL_TABLET | ORAL | Status: DC

## 2016-02-06 NOTE — Progress Notes (Signed)
Pre visit review using our clinic review tool, if applicable. No additional management support is needed unless otherwise documented below in the visit note.  Wants to quit chewing tobacco.  Prev used chantix w/o ADE.  D/w pt about routine cautions, esp re: mood.  At this point, no contraindication.  I encouraged cessation.   ED.  D/w pt about DM2, CAD, tobacco.  I want him to check with cards and ask about possible viagra use, to see if they will approve of the use.    Back pain.  R sided sciatica.  Started about 1 week ago.  No L sided sx.  No B/B sx.  Trouble getting in and out of bed.  Better laying flat but worse with position change.  Some better today but still in pain.  Can still bear weight.  Numbness and tingling in the R leg.  Sugar has been ~130s in the AM.  Hydrocodone didn't help.    Meds, vitals, and allergies reviewed.   ROS: Per HPI unless specifically indicated in ROS section   nad ncat rrr ctab Back not ttp in midline but R SLR positive.   S/S grossly intact R leg.  Able to bear weight.

## 2016-02-06 NOTE — Assessment & Plan Note (Signed)
Restart chantix with routine cautions, has tolerated prev.  Mood okay, no contraindication to starting med.  He agrees.   Encouraged cessation.

## 2016-02-06 NOTE — Assessment & Plan Note (Signed)
D/w pt about options.  Reasonable to try taking two aleve a day with food. Use heat and stretch.  If not better, then start prednisone with food and he'll likely have to temporarily increase his insulin.  D/w pt.  He'll update me.  He agrees.

## 2016-02-06 NOTE — Patient Instructions (Addendum)
Ask the cardiology clinic if they are okay with you using viagra or similar meds.   Start the chantix in the meantime.   Try taking 2 aleve a day with food.  Use heat and stretch.  If not better, then try the prednisone with food and you'll likely have to temporarily increase your insulin.  Update me as needed.  Take care.  Glad to see you.

## 2016-02-07 ENCOUNTER — Telehealth: Payer: Self-pay

## 2016-02-07 MED ORDER — SILDENAFIL CITRATE 20 MG PO TABS: 60.0000 mg | ORAL_TABLET | Freq: Every day | ORAL | Status: DC | PRN

## 2016-02-07 NOTE — Telephone Encounter (Signed)
Sent.  Don't use with NTG.  Start with lower dose.  Max 100mg  viagra per day.   Thanks.

## 2016-02-07 NOTE — Telephone Encounter (Signed)
Pt left v/m;pt was seen 02/06/16. pt spoke with Dr Einar Gip, cardiologist and it is OK for pt to take generic Viagra or generic Cialis. Please send to [facility].

## 2016-02-07 NOTE — Telephone Encounter (Signed)
Patient advised.

## 2016-02-10 ENCOUNTER — Ambulatory Visit: Payer: Medicare Other

## 2016-02-11 ENCOUNTER — Other Ambulatory Visit: Payer: Self-pay | Admitting: Podiatry

## 2016-02-11 DIAGNOSIS — Z77018 Contact with and (suspected) exposure to other hazardous metals: Secondary | ICD-10-CM

## 2016-02-12 ENCOUNTER — Ambulatory Visit
Admission: RE | Admit: 2016-02-12 | Discharge: 2016-02-12 | Disposition: A | Discharge status: Home / Self Care | Payer: Medicare Other | Source: Ambulatory Visit | Attending: Podiatry | Admitting: Podiatry

## 2016-02-12 ENCOUNTER — Other Ambulatory Visit: Payer: Medicare Other

## 2016-02-12 DIAGNOSIS — Z77018 Contact with and (suspected) exposure to other hazardous metals: Secondary | ICD-10-CM

## 2016-02-12 DIAGNOSIS — M722 Plantar fascial fibromatosis: Secondary | ICD-10-CM

## 2016-02-13 ENCOUNTER — Ambulatory Visit: Payer: Medicare Other | Admitting: Podiatry

## 2016-02-13 NOTE — Telephone Encounter (Signed)
-----   Message from Trula Slade, DPM sent at 02/12/2016  8:02 PM EDT ----- Can you let him know that the MRI shows a possible partial tear of the plantar fascia. Would stay in CAM boot.

## 2016-02-20 ENCOUNTER — Encounter: Payer: Self-pay | Admitting: Podiatry

## 2016-02-20 ENCOUNTER — Telehealth: Payer: Self-pay | Admitting: *Deleted

## 2016-02-20 ENCOUNTER — Ambulatory Visit (INDEPENDENT_AMBULATORY_CARE_PROVIDER_SITE_OTHER): Payer: Medicare Other | Admitting: Podiatry

## 2016-02-20 DIAGNOSIS — M722 Plantar fascial fibromatosis: Secondary | ICD-10-CM | POA: Diagnosis not present

## 2016-02-20 DIAGNOSIS — M629 Disorder of muscle, unspecified: Secondary | ICD-10-CM | POA: Diagnosis not present

## 2016-02-20 DIAGNOSIS — Z01818 Encounter for other preprocedural examination: Secondary | ICD-10-CM

## 2016-02-20 NOTE — Telephone Encounter (Signed)
"  I need to talk to you about scheduling a surgery on my foot by Dr. Jacqualyn Posey."

## 2016-02-20 NOTE — Patient Instructions (Signed)

## 2016-02-21 NOTE — Telephone Encounter (Signed)
I attempted to return his call.  I left him a message to call me back. 

## 2016-02-23 NOTE — Progress Notes (Signed)
Patient ID: Tony Manning, male   DOB: 08-19-1961, 55 y.o.   MRN: [id number]  Subjective: 55 year old male presents the office to discuss MRI results the left foot. He states that he continues to have quite a bit of pain to his left heel and his been wearing the cam boot. The cam boot does help provide some stability however he continues to have pain. Denies any swelling or redness. No recent injury. Denies any systemic complaints such as fevers, chills, nausea, vomiting. No acute changes since last appointment, and no other complaints at this time.   Objective: AAO x3, NAD DP/PT pulses palpable bilaterally, CRT less than 3 seconds This continuation of tenderness of the plantar medial tubercle of the calcaneus at the insertion the plantar fascia. There is no pain on the course of plantar fascial within the arch of the foot. There does appear to be some tenderness on the navicular tuberosity the medial aspect of the foot however not significant. Very minimal discomfort on the course the ATFL on the left lateral ankle ligaments. There is no gross instability the ankle present. No areas of pinpoint bony tenderness or pain with vibratory sensation. MMT 5/5, ROM WNL. No edema, erythema, increase in warmth to bilateral lower extremities.  No open lesions or pre-ulcerative lesions.  No pain with calf compression, swelling, warmth, erythema  Assessment: Chronic left heel pain likely due to partial tearing/plantar fasciitis.  Plan: -All treatment options discussed with the patient including all alternatives, risks, complications.  -MRI results were discussed the patient. See below. -At this point the majority of his symptoms are localized to the plantar aspect of the heel. This been ongoing and he states he has not had any relief of symptoms after doing multiple conservative treatments. I discussed with him plantar fascial release and he would also like to have a heel spur resected. I discussed this  is not a guarantee of resolution symptoms include other pathology and his symptoms may continue he understands this and wishes to proceed. -The incision placement as well as the postoperative course was discussed with the patient. I discussed risks of the surgery which include, but not limited to, infection, bleeding, pain, swelling, need for further surgery, delayed or nonhealing, painful or ugly scar, numbness or sensation changes, over/under correction, recurrence, transfer lesions, further deformity, hardware failure, DVT/PE, loss of toe/foot. Patient understands these risks and wishes to proceed with surgery. The surgical consent was reviewed with the patient all 3 pages were signed. No promises or guarantees were given to the outcome of the procedure. All questions were answered to the best of my ability. Before the surgery the patient was encouraged to call the office if there is any further questions. The surgery will be performed at the [facility] on an outpatient basis. -Ordered pre-op blood work.  -Patient encouraged to call the office with any questions, concerns, change in symptoms.   IMPRESSION: 1. Abnormal thickening of the proximal band of the plantar fascia with surrounding edema and likely some mild partial tearing at the proximal attachment site. The thickening of the medial band is bilobed, probably from inflammation, although a component of plantar fibromatosis cannot be excluded. 2. Prominently thickened superomedial portion of the spring ligament with adjacent distal tibialis posterior tendinopathy, correlate clinically in assessing for tibialis posterior dysfunction. 3. Thinned but not discontinuous anterior talofibular ligament, query old injury. 4. Mild peroneus longus tenosynovitis.

## 2016-02-27 ENCOUNTER — Ambulatory Visit: Payer: Medicare Other | Admitting: Internal Medicine

## 2016-02-27 ENCOUNTER — Telehealth: Payer: Self-pay | Admitting: Family Medicine

## 2016-02-27 NOTE — Telephone Encounter (Signed)
Noted. Please check on patient Friday, June 23 to see if he is any better after OTC treatment.

## 2016-02-27 NOTE — Telephone Encounter (Signed)
Offered pt an appt this afternoon and pt said would try OTC med and if that did not help pt would cb for appt.

## 2016-02-27 NOTE — Telephone Encounter (Signed)
Patient Name: Tony Manning DOB: [birth year] Initial Comment Caller states he's dizzy. Nurse Assessment Nurse: Ronnald Ramp, RN, Miranda Date/Time (Eastern Time): 02/27/2016 8:11:52 AM Confirm and document reason for call. If symptomatic, describe symptoms. You must click the next button to save text entered. ---Caller states he is having dizziness when he changes positions too quickly for the last 4-5 days. Has the patient traveled out of the country within the last 30 days? ---Not Applicable Does the patient have any new or worsening symptoms? ---Yes Will a triage be completed? ---No Select reason for no triage. ---Patient declined Please document clinical information provided and list any resource used. ---Caller states he was just wanting an appt. Told caller that there is a process to follow to determine how quickly we need to make an appt. Caller states never mind, it will go away, and hung up. Guidelines Guideline Title Affirmed Question Affirmed Notes Final Disposition User Clinical Call Ronnald Ramp, RN, Miranda Comments Call disconnected during initial assessment questions. Attempted to reach call back on secondary number (which was preferred number) and on primary number and reach caller.

## 2016-02-28 NOTE — Telephone Encounter (Signed)
Left detailed message on voicemail of preferred number. 

## 2016-04-29 ENCOUNTER — Telehealth: Payer: Self-pay

## 2016-04-29 DIAGNOSIS — M542 Cervicalgia: Secondary | ICD-10-CM

## 2016-04-29 NOTE — Telephone Encounter (Signed)
Patient notified that order has been placed per his request.

## 2016-04-29 NOTE — Telephone Encounter (Signed)
Pt request referral to Dr Earnie Larsson neurosurgeon for rt neck and shoulder pain; last MRI of neck 04/09/15. Pt does not want to see Dr Louanne Skye again. 08/06/15 Dr [name] did 2nd opinion referral. Pt request cb.

## 2016-04-29 NOTE — Telephone Encounter (Signed)
Ordered. Thanks

## 2016-05-12 ENCOUNTER — Encounter: Payer: Self-pay | Admitting: Family Medicine

## 2016-05-12 ENCOUNTER — Other Ambulatory Visit: Payer: Self-pay | Admitting: Family Medicine

## 2016-05-12 ENCOUNTER — Ambulatory Visit (INDEPENDENT_AMBULATORY_CARE_PROVIDER_SITE_OTHER): Payer: Medicare Other | Admitting: Family Medicine

## 2016-05-12 VITALS — BP 118/72 | HR 66 | Temp 98.4°F | Wt 214.8 lb

## 2016-05-12 DIAGNOSIS — Z23 Encounter for immunization: Secondary | ICD-10-CM | POA: Diagnosis not present

## 2016-05-12 DIAGNOSIS — E1149 Type 2 diabetes mellitus with other diabetic neurological complication: Secondary | ICD-10-CM

## 2016-05-12 LAB — COMPREHENSIVE METABOLIC PANEL
ALT: 29 U/L (ref 0–53)
AST: 20 U/L (ref 0–37)
Albumin: 4.6 g/dL (ref 3.5–5.2)
Alkaline Phosphatase: 64 U/L (ref 39–117)
BUN: 17 mg/dL (ref 6–23)
CO2: 26 mEq/L (ref 19–32)
Calcium: 9.6 mg/dL (ref 8.4–10.5)
Chloride: 105 mEq/L (ref 96–112)
Creatinine, Ser: 1.01 mg/dL (ref 0.40–1.50)
GFR: 81.16 mL/min (ref >60.00)
Glucose, Bld: 149 mg/dL — ABNORMAL HIGH (ref 70–99)
Potassium: 4.5 mEq/L (ref 3.5–5.1)
Sodium: 139 mEq/L (ref 135–145)
Total Bilirubin: 0.6 mg/dL (ref 0.2–1.2)
Total Protein: 7.8 g/dL (ref 6.0–8.3)

## 2016-05-12 LAB — CBC WITH DIFFERENTIAL/PLATELET
Basophils Absolute: 0 10*3/uL (ref 0.0–0.1)
Basophils Relative: 0.4 % (ref 0.0–3.0)
Eosinophils Absolute: 0.2 10*3/uL (ref 0.0–0.7)
Eosinophils Relative: 2.2 % (ref 0.0–5.0)
HCT: 46.4 % (ref 39.0–52.0)
Hemoglobin: 16 g/dL (ref 13.0–17.0)
Lymphocytes Relative: 21.7 % (ref 12.0–46.0)
Lymphs Abs: 2.1 10*3/uL (ref 0.7–4.0)
MCHC: 34.4 g/dL (ref 30.0–36.0)
MCV: 89.1 fl (ref 78.0–100.0)
Monocytes Absolute: 0.7 10*3/uL (ref 0.1–1.0)
Monocytes Relative: 7.3 % (ref 3.0–12.0)
Neutro Abs: 6.7 10*3/uL (ref 1.4–7.7)
Neutrophils Relative %: 68.4 % (ref 43.0–77.0)
Platelets: 212 10*3/uL (ref 150.0–400.0)
RBC: 5.21 Mil/uL (ref 4.22–5.81)
RDW: 13.8 % (ref 11.5–15.5)
WBC: 9.8 10*3/uL (ref 4.0–10.5)

## 2016-05-12 LAB — LIPID PANEL
Cholesterol: 120 mg/dL (ref 0–200)
HDL: 41.7 mg/dL (ref >39.00)
LDL Cholesterol: 58 mg/dL (ref 0–99)
NonHDL: 78.63
Total CHOL/HDL Ratio: 3
Triglycerides: 101 mg/dL (ref 0.0–149.0)
VLDL: 20.2 mg/dL (ref 0.0–40.0)

## 2016-05-12 LAB — HEMOGLOBIN A1C: Hgb A1c MFr Bld: 7.9 % — ABNORMAL HIGH (ref 4.6–6.5)

## 2016-05-12 MED ORDER — INSULIN DETEMIR 100 UNIT/ML FLEXPEN: 40.0000 [IU] | PEN_INJECTOR | Freq: Every morning | SUBCUTANEOUS | 99 refills | Status: DC

## 2016-05-12 MED ORDER — INSULIN PEN NEEDLE 31G X 4 MM MISC: 1.0000 | Freq: Every day | 3 refills | Status: DC

## 2016-05-12 NOTE — Assessment & Plan Note (Addendum)
See notes on labs.  Less exercise recently- foot and neck surgery pending, likely affecting sugar.   D/w pt about med cost options.  See AVS.  Foot care d/w pt   Rash is likely incidental, d/w pt, presumed insect bites vs poison ivy.  Some better, doesn't appear infected, can use prn hydrocortisone.  He agrees.

## 2016-05-12 NOTE — Patient Instructions (Signed)
Go to the lab on the way out.  We'll contact you with your lab report. Ask [facility]/your insurance if basaglar or lantus will be cheaper.  Hydrocortisone prn on the itchy spots. They should heal over.  Take care.  Glad to see you.  Update me as needed.

## 2016-05-12 NOTE — Progress Notes (Signed)
Diabetes:  Using medications without difficulties:yes Hypoglycemic episodes:no Hyperglycemic episodes:see below Feet problems: "feel like my feet are in the freezer all the time." Blood Sugars averaging: see below eye exam within last year: due, d/w pt.  He had to reschedule appointment recently.  Had worked up to 40 units insulin.  Today is the first day in weeks of AM sugar <130.  Usually had been >150 recently.  Had been gradually working up on his dose in response to higher sugars.  No change in diet.   Urinary frequency, nocturia.  Better last night, when sugar was lower.  D/w pt.   He has had a few sweats, in the day vs night, hadn't checked sugar with the events, needs to do so- d/w pt.  Less exercise recently- foot and neck surgery pending.   He has f/u with cardiology tomorrow.    R arm, B trunk and R leg with itchy spots noted. Less itchy now.  Had been walking in the woods recently.    Meds, vitals, and allergies reviewed.   ROS: Per HPI unless specifically indicated in ROS section   GEN: nad, alert and oriented HEENT: mucous membranes moist NECK: supple w/o LA CV: rrr. PULM: ctab, no inc wob ABD: soft, +bs EXT: no edema SKIN: blanching small reddish macules noted in irregular distribution on the R arm and R leg and trunk.  No ulceration.   Diabetic foot exam: Normal inspection No skin breakdown No calluses  Normal DP pulses Dec sensation to light touch and monofilament Nails normal

## 2016-05-12 NOTE — Progress Notes (Signed)
Pre visit review using our clinic review tool, if applicable. No additional management support is needed unless otherwise documented below in the visit note. 

## 2016-05-12 NOTE — Addendum Note (Signed)
Addended by: Josetta Huddle on: 05/12/2016 10:57 AM   Modules accepted: Orders

## 2016-05-21 LAB — HM DIABETES EYE EXAM

## 2016-05-26 ENCOUNTER — Ambulatory Visit (INDEPENDENT_AMBULATORY_CARE_PROVIDER_SITE_OTHER): Payer: Medicare Other | Admitting: Family Medicine

## 2016-05-26 ENCOUNTER — Other Ambulatory Visit: Payer: Self-pay | Admitting: Neurosurgery

## 2016-05-26 ENCOUNTER — Encounter: Payer: Self-pay | Admitting: Family Medicine

## 2016-05-26 DIAGNOSIS — M5412 Radiculopathy, cervical region: Secondary | ICD-10-CM

## 2016-05-26 DIAGNOSIS — M542 Cervicalgia: Secondary | ICD-10-CM

## 2016-05-26 NOTE — Progress Notes (Signed)
Had been taking his mother's oxycodone for shoulder and neck pain.  Last time he has a good night sleep was months ago.  D/w pt.  Very likely related to energy level.   D/w pt.    Low energy, predates the oxycodone use.  Longstanding per patient.  See above re: sleep disruption.    He went to see Dr. Annette Stable last week.  The alleged plan was to do a MRI prior to possible surgery.  Per patient, he didn't need a prior approval for MRI.  He reports he had a misunderstanding with the clinic about the process for the PA.  He wanted to go to another clinic.  D/w pt.    Meds, vitals, and allergies reviewed.   ROS: Per HPI unless specifically indicated in ROS section   Exam deferred.

## 2016-05-26 NOTE — Progress Notes (Signed)
Pre visit review using our clinic review tool, if applicable. No additional management support is needed unless otherwise documented below in the visit note. 

## 2016-05-26 NOTE — Patient Instructions (Addendum)
Let me know if I can be of service.  Take care.  Glad to see you.  No charge for visit.

## 2016-05-27 NOTE — Assessment & Plan Note (Addendum)
As best I can tell, the plan was for a repeat MRI then possible surgery. The patient reports he had a misunderstanding or some type of miscommunication with the surgery clinic. I was not involved in that. He reports that he is annoyed with the surgery clinic and does not want to go back. I offered a referral to a different clinic. There are no neurosurgeon clinics in [place] to my knowledge. There are no other neurosurgery clinics in [place]. I offered neurosurgical referral to multiple tertiary care centers that are within driving distance. He declined all of that. I asked what I can do, and he said "he'll put up with it". I cannot advise him to take his mother's oxycodone. He is aware of that.   Since I did not change his medicines or put in referral or change his care plan, I did not charge him for the visit. I did not refill his oxycodone. The visit ended amicably.

## 2016-05-31 ENCOUNTER — Ambulatory Visit
Admission: RE | Admit: 2016-05-31 | Discharge: 2016-05-31 | Disposition: A | Discharge status: Home / Self Care | Payer: Medicare Other | Source: Ambulatory Visit | Attending: Neurosurgery | Admitting: Neurosurgery

## 2016-05-31 DIAGNOSIS — M5412 Radiculopathy, cervical region: Secondary | ICD-10-CM

## 2016-06-02 ENCOUNTER — Encounter: Payer: Self-pay | Admitting: Family Medicine

## 2016-06-05 ENCOUNTER — Encounter: Payer: Self-pay | Admitting: Family Medicine

## 2016-06-09 ENCOUNTER — Ambulatory Visit: Admission: RE | Admit: 2016-06-09 | Payer: Medicare Other | Source: Ambulatory Visit

## 2016-06-11 ENCOUNTER — Ambulatory Visit (INDEPENDENT_AMBULATORY_CARE_PROVIDER_SITE_OTHER): Payer: Medicare Other | Admitting: Family Medicine

## 2016-06-11 ENCOUNTER — Encounter: Payer: Self-pay | Admitting: Family Medicine

## 2016-06-11 VITALS — BP 122/72 | HR 65 | Temp 98.4°F | Wt 217.8 lb

## 2016-06-11 DIAGNOSIS — R1310 Dysphagia, unspecified: Secondary | ICD-10-CM

## 2016-06-11 DIAGNOSIS — M542 Cervicalgia: Secondary | ICD-10-CM | POA: Diagnosis not present

## 2016-06-11 DIAGNOSIS — M792 Neuralgia and neuritis, unspecified: Secondary | ICD-10-CM

## 2016-06-11 MED ORDER — RANITIDINE HCL 150 MG PO TABS: 150.0000 mg | ORAL_TABLET | Freq: Two times a day (BID) | ORAL | Status: DC

## 2016-06-11 NOTE — Progress Notes (Signed)
"  I need a neurosurgeon."   He went back to try to get the MRI done.  He couldn't tolerate the testing, he couldn't lay down long enough to get the MRI done.   He went to see Dr. Annette Stable yesterday, to discuss his situation.  Per patient, he wasn't seen at the clinic yesterday.   Continual neck and R shoulder pain.  R arm feels numb.  D/w pt about referral options, ie tertiary care center.    He had stress test recently done with cards, with f/u pending.  At this point, he doesn't have plan for heart cath pending.  No CP.    He has feeling of food sticking with eating, mid chest.  It will gradually ease and pass.  No troubles with liquids, not all the time.  No vomiting, no blood in stool. Only having sx with occ meals, no exertional o/w.    Meds, vitals, and allergies reviewed.   ROS: Per HPI unless specifically indicated in ROS section   nad but uncomfortable ncat Neck with pain on ROM rrr ctab He has deg pincher strength with first two digits on the R hand compared to L, this is not acute per patient, is long standing per patient.

## 2016-06-11 NOTE — Patient Instructions (Addendum)
Tony Manning will call about your referral. Add on zantac 150mg  BID to see if that helps your swallowing.   Take care.  Glad to see you.

## 2016-06-11 NOTE — Progress Notes (Signed)
Pre visit review using our clinic review tool, if applicable. No additional management support is needed unless otherwise documented below in the visit note. 

## 2016-06-12 ENCOUNTER — Other Ambulatory Visit: Payer: Medicare Other

## 2016-06-12 DIAGNOSIS — R1310 Dysphagia, unspecified: Secondary | ICD-10-CM | POA: Insufficient documentation

## 2016-06-12 NOTE — Assessment & Plan Note (Signed)
Occasional symptoms. Discussed with patient. Reasonable to start Zantac in the meantime. He may have a transient esophageal spasm that is worsened by GERD. At this point still okay for outpatient follow-up.

## 2016-06-12 NOTE — Assessment & Plan Note (Signed)
We can refer.  Patient needs to check to see if [place] [place] would be in his network. I did not refer him for another MRI at this point. He was asking about a sedation MRI due to his level of pain. I have never order this before. If he comes down to this he would likely need cardiac clearance before we can consider that. Discussed with patient. I will await input from patient.

## 2016-06-23 ENCOUNTER — Telehealth: Payer: Self-pay | Admitting: Family Medicine

## 2016-06-23 NOTE — Telephone Encounter (Signed)
Pt has had mri at [facility], they want to do injections in pt's neck due to bulging discs.  He has seen [facility] [place].  [facility] is suggesting to pt to see if he can do them here.  Is this something that you can do?  cb number is [phone]

## 2016-06-24 NOTE — Telephone Encounter (Signed)
No.  Interventional radiology or PMR could do. His pcp could probably arrange if inclined.

## 2016-06-25 NOTE — Telephone Encounter (Signed)
Patient called and I let him know that Dr.Copland can't do the injection.  Patient said he'll go to [facility].

## 2016-06-25 NOTE — Telephone Encounter (Signed)
Noted. Thanks.

## 2016-07-08 ENCOUNTER — Telehealth: Payer: Self-pay

## 2016-07-08 NOTE — Telephone Encounter (Signed)
Pt left v/m; when pt got up this morning  FBS was 146; 1 1/2 hours later BS was 430; pt had not eaten or drank anything. Pt had taken usual pills and insulin first thing this morning. Now BS is 296. Pt wants to know what to do.pt request cb.

## 2016-07-08 NOTE — Telephone Encounter (Signed)
Left message on voicemail for patient to call back. 

## 2016-07-08 NOTE — Telephone Encounter (Signed)
He needs to calibrate his meter with control solution or against another meter (if he has access to another meter). If AM sugar >120, add a unit to his levemir dose. If AM sugar <80, decrease a unit. If 80-120, then no change in dose.  I would slowly adjust that as needed.  Drink plenty of fluids in the meantime.  Thanks.

## 2016-07-09 NOTE — Telephone Encounter (Signed)
Left detailed message on voicemail.  

## 2016-07-13 ENCOUNTER — Encounter: Payer: Self-pay | Admitting: *Deleted

## 2016-07-13 NOTE — Telephone Encounter (Signed)
Still not able to contact patient personally.  Letter mailed.

## 2016-07-16 ENCOUNTER — Ambulatory Visit: Payer: Medicare Other | Admitting: Podiatry

## 2016-07-23 DIAGNOSIS — M5412 Radiculopathy, cervical region: Secondary | ICD-10-CM | POA: Insufficient documentation

## 2016-08-10 ENCOUNTER — Ambulatory Visit (INDEPENDENT_AMBULATORY_CARE_PROVIDER_SITE_OTHER): Payer: Medicare Other | Admitting: Family Medicine

## 2016-08-10 ENCOUNTER — Encounter: Payer: Self-pay | Admitting: Family Medicine

## 2016-08-10 VITALS — BP 122/70 | HR 66 | Temp 98.5°F | Wt 216.2 lb

## 2016-08-10 DIAGNOSIS — E1149 Type 2 diabetes mellitus with other diabetic neurological complication: Secondary | ICD-10-CM | POA: Diagnosis not present

## 2016-08-10 DIAGNOSIS — M542 Cervicalgia: Secondary | ICD-10-CM | POA: Diagnosis not present

## 2016-08-10 DIAGNOSIS — I10 Essential (primary) hypertension: Secondary | ICD-10-CM

## 2016-08-10 DIAGNOSIS — Z794 Long term (current) use of insulin: Secondary | ICD-10-CM | POA: Diagnosis not present

## 2016-08-10 LAB — HEMOGLOBIN A1C: Hgb A1c MFr Bld: 7.9 % — ABNORMAL HIGH (ref 4.6–6.5)

## 2016-08-10 LAB — BASIC METABOLIC PANEL
BUN: 15 mg/dL (ref 6–23)
CO2: 29 mEq/L (ref 19–32)
Calcium: 10.5 mg/dL (ref 8.4–10.5)
Chloride: 105 mEq/L (ref 96–112)
Creatinine, Ser: 1.04 mg/dL (ref 0.40–1.50)
GFR: 78.4 mL/min (ref >60.00)
Glucose, Bld: 240 mg/dL — ABNORMAL HIGH (ref 70–99)
Potassium: 4.6 mEq/L (ref 3.5–5.1)
Sodium: 141 mEq/L (ref 135–145)

## 2016-08-10 MED ORDER — LOSARTAN POTASSIUM 100 MG PO TABS: 50.0000 mg | ORAL_TABLET | Freq: Every day | ORAL | Status: DC

## 2016-08-10 NOTE — Patient Instructions (Addendum)
Cut the losartan in half, down to 50mg  a day.  Go to the lab on the way out.  We'll contact you with your lab report. We'll notify Dr. Einar Gip about your labs.   Take care.  Glad to see you.

## 2016-08-10 NOTE — Progress Notes (Signed)
Pre visit review using our clinic review tool, if applicable. No additional management support is needed unless otherwise documented below in the visit note. 

## 2016-08-10 NOTE — Assessment & Plan Note (Addendum)
See notes on labs.  No change in diabetic medications at the time of office visit.

## 2016-08-10 NOTE — Progress Notes (Signed)
Recently tired and weak with losartan start.  Has been lightheaded if getting up quickly.   DM2.  Has been taking ~32 units a day.   Due for A1c.  He has had episodic sugar elevations but no lows.    He had epidural done re: back pain w/o much long term relief.  It did help for about 1 day.  He has seen the spine clinic.  He may have surgery in March of 2018, per surgery clinic.    Meds, vitals, and allergies reviewed.   ROS: Per HPI unless specifically indicated in ROS section   GEN: nad, alert and oriented HEENT: mucous membranes moist NECK: supple w/o LA CV: rrr.  PULM: ctab, no inc wob ABD: soft, +bs EXT: no edema

## 2016-08-11 NOTE — Assessment & Plan Note (Signed)
Sounds like he is over treated with losartan. Cut back to 50 mg a day. See notes on labs.

## 2016-08-11 NOTE — Assessment & Plan Note (Signed)
Per surgery clinic.

## 2016-08-25 ENCOUNTER — Ambulatory Visit: Payer: Medicare Other | Admitting: Family Medicine

## 2016-08-25 DIAGNOSIS — Z0289 Encounter for other administrative examinations: Secondary | ICD-10-CM

## 2016-08-27 ENCOUNTER — Telehealth: Payer: Self-pay

## 2016-08-27 NOTE — Telephone Encounter (Signed)
Pt thinks he has another kidney stone and request oxycodone. Advised pt would need to be seen. No available appts at [facility] and pt does not want to go to another [facility] site. Pt said if got worse would go to ED. Pt has never seen urologist.

## 2016-09-03 ENCOUNTER — Telehealth: Payer: Self-pay

## 2016-09-03 NOTE — Telephone Encounter (Signed)
Pt left v/m; does not need refills now but will be changing pharmacies from [facility] to [facility]. Advised pt if has available refills new pharmacy will request from [facility] and those rx can be transferred. Pt voiced understanding.

## 2016-09-08 ENCOUNTER — Other Ambulatory Visit: Payer: Self-pay | Admitting: *Deleted

## 2016-09-08 MED ORDER — METFORMIN HCL 1000 MG PO TABS: ORAL_TABLET | ORAL | 1 refills | Status: DC

## 2016-09-08 MED ORDER — ATORVASTATIN CALCIUM 40 MG PO TABS: 40.0000 mg | ORAL_TABLET | Freq: Every day | ORAL | 3 refills | Status: DC

## 2016-09-09 ENCOUNTER — Other Ambulatory Visit: Payer: Self-pay

## 2016-09-09 MED ORDER — INSULIN DETEMIR 100 UNIT/ML FLEXPEN: 40.0000 [IU] | PEN_INJECTOR | Freq: Every morning | SUBCUTANEOUS | 99 refills | Status: DC

## 2016-09-09 NOTE — Telephone Encounter (Signed)
Pt said when he picks up the levemir now it cost him more money and does not last as long. I spoke with Jone Baseman at [facility] and rx was transferred from [place]. They thought pt was to get 15 ml not 15 pens. Jone Baseman said cannot change quantity since transfer so request new rx. Done. Pt voiced understanding.

## 2016-09-17 ENCOUNTER — Encounter: Payer: Self-pay | Admitting: Podiatry

## 2016-09-17 ENCOUNTER — Ambulatory Visit (INDEPENDENT_AMBULATORY_CARE_PROVIDER_SITE_OTHER): Payer: Medicare HMO | Admitting: Podiatry

## 2016-09-17 DIAGNOSIS — M2042 Other hammer toe(s) (acquired), left foot: Secondary | ICD-10-CM | POA: Diagnosis not present

## 2016-09-17 DIAGNOSIS — M722 Plantar fascial fibromatosis: Secondary | ICD-10-CM | POA: Diagnosis not present

## 2016-09-17 DIAGNOSIS — M2011 Hallux valgus (acquired), right foot: Secondary | ICD-10-CM | POA: Diagnosis not present

## 2016-09-17 DIAGNOSIS — M2041 Other hammer toe(s) (acquired), right foot: Secondary | ICD-10-CM

## 2016-09-17 DIAGNOSIS — M79673 Pain in unspecified foot: Secondary | ICD-10-CM | POA: Diagnosis not present

## 2016-09-17 DIAGNOSIS — E1149 Type 2 diabetes mellitus with other diabetic neurological complication: Secondary | ICD-10-CM | POA: Diagnosis not present

## 2016-09-18 NOTE — Progress Notes (Signed)
Subjective: 56 year old male presents the office today for concerns of left heel pain. After his last appointment with me he canceled surgery as his heel is doing much better. Over the last 2-3 weeks to start to flare back up. He states is not as painful as what it was initially however he elected to go ahead and start treatment for it again. He denies any recent injury or trauma. He does have neuropathy. No swelling or redness. The pain does not wake him up at night. He also is requesting diabetic shoes. Denies any systemic complaints such as fevers, chills, nausea, vomiting. No acute changes since last appointment, and no other complaints at this time.   Objective: AAO x3, NAD DP/PT pulses palpable bilaterally, CRT less than 3 seconds Sensation decreased with Simms Weinstein monofilament Tenderness to palpation along the plantar medial tubercle of the calcaneus at the insertion of plantar fascia on the left foot. There is no pain along the course of the plantar fascia within the arch of the foot. Plantar fascia appears to be intact. There is no pain with lateral compression of the calcaneus or pain with vibratory sensation. There is no pain along the course or insertion of the achilles tendon. No other areas of tenderness to bilateral lower extremities. Hammertoes present No open lesions or pre-ulcerative lesions.  No pain with calf compression, swelling, warmth, erythema  Assessment: Reoccurrence of left heel pain, plantar fasciitis  Plan: -All treatment options discussed with the patient including all alternatives, risks, complications.  -At this time the injection of Celestone as well as local anesthetic was infiltrated into the area of maximal tenderness the left heel along the plantar medial tubercle of the insertion of the plantar fascia. Postinjection care with discussed. Monitor blood sugar closely. -Continue stretching and icing daily. Return to plantar fascial brace. -Paperwork was  completed today for certification diabetic shoes. -Patient encouraged to call the office with any questions, concerns, change in symptoms.   Celesta Gentile, DPM

## 2016-09-24 ENCOUNTER — Ambulatory Visit: Payer: Medicare HMO | Admitting: Podiatry

## 2016-10-01 ENCOUNTER — Ambulatory Visit (INDEPENDENT_AMBULATORY_CARE_PROVIDER_SITE_OTHER): Payer: Medicare HMO

## 2016-10-01 ENCOUNTER — Ambulatory Visit (INDEPENDENT_AMBULATORY_CARE_PROVIDER_SITE_OTHER): Payer: Medicare HMO | Admitting: Specialist

## 2016-10-01 ENCOUNTER — Encounter (INDEPENDENT_AMBULATORY_CARE_PROVIDER_SITE_OTHER): Payer: Self-pay | Admitting: Specialist

## 2016-10-01 VITALS — BP 153/79 | Ht 70.0 in | Wt 215.0 lb

## 2016-10-01 DIAGNOSIS — M542 Cervicalgia: Secondary | ICD-10-CM

## 2016-10-01 DIAGNOSIS — G4733 Obstructive sleep apnea (adult) (pediatric): Secondary | ICD-10-CM | POA: Diagnosis not present

## 2016-10-01 DIAGNOSIS — R29898 Other symptoms and signs involving the musculoskeletal system: Secondary | ICD-10-CM

## 2016-10-01 DIAGNOSIS — M25511 Pain in right shoulder: Secondary | ICD-10-CM | POA: Diagnosis not present

## 2016-10-01 DIAGNOSIS — G8929 Other chronic pain: Secondary | ICD-10-CM

## 2016-10-01 DIAGNOSIS — E785 Hyperlipidemia, unspecified: Secondary | ICD-10-CM | POA: Diagnosis not present

## 2016-10-01 DIAGNOSIS — I25119 Atherosclerotic heart disease of native coronary artery with unspecified angina pectoris: Secondary | ICD-10-CM | POA: Diagnosis not present

## 2016-10-01 DIAGNOSIS — R0602 Shortness of breath: Secondary | ICD-10-CM | POA: Diagnosis not present

## 2016-10-01 DIAGNOSIS — I10 Essential (primary) hypertension: Secondary | ICD-10-CM | POA: Diagnosis not present

## 2016-10-01 DIAGNOSIS — M25512 Pain in left shoulder: Secondary | ICD-10-CM

## 2016-10-01 MED ORDER — NAPROXEN 500 MG PO TABS: 500.0000 mg | ORAL_TABLET | Freq: Two times a day (BID) | ORAL | 2 refills | Status: DC

## 2016-10-01 NOTE — Patient Instructions (Addendum)
   No lifting greater than 10 lbs. No overhead use of arms. Avoid bending,and twisting neck. Make an appointment to see Dr. Ninfa Linden after the results of the right shoulder MRI are available. Ice and heat are helpful for pain associated with the shoulder.  Alleve is helpful but talk to your cardiologist about it use because of the CAD and stents.

## 2016-10-01 NOTE — Progress Notes (Addendum)
Office Visit Note   Patient: Tony Manning           Date of Birth: May 29, 1961           MRN: [id number] Visit Date: 10/01/2016              Requested by: Tony Ghent, MD [place], [place] [address] PCP: Tony Stain, MD   Assessment & Plan: Visit Diagnoses:  1. Cervicalgia   2. Chronic right shoulder pain   3. Shoulder weakness   4. Chronic left shoulder pain     56 year old right handed male with 2 level cervical fusion C5-7 2010, increasing neck and right shoulder pain, chronic left shoulder pain Had left shoulder arthroscopy by Tony Manning in 2007, previous  Right shoulder MRI at [facility] neurosurgery, Tony Fuelling MD read 02/2014.  History of CAD with AMI 2 years ago, has stents, to see cardiology today for EKG for some episodic chest pain.  Pain in neck and shoulder better since  an ESI in the lower cervical spine done at [facility] in November 2017. Clinically he has a positive empty can sign on the right and drop arm sign. infraspinatous and subscapularis strength is normal. Positive impingment sign. Does not desire an injection of the right shoulder. MRI of the right shoulder 02/2014 shows a downsloping lateral acromion process impinging on the supraspinatous with edema changes involving the subacromial 50% of the supra spinatous tendon. MRI of the cervical spine with small HNP right C4-5 and bilateral foramenal narrowing right side greater than left, right C5 nerve entrapment. Surgery of the neck is a consideration but would result in a 3 level fusion and likely need for more surgery in the future. I recommend that we work up the right shoulder for a likely rotator cuff tear. Given ROM and stretching exercises for the right shoulder. Try NSAIDs but he is to check with cardiology before using Naprosyn for the right shoulder and neck pain. I will follow up with him for his neck in 3 months but have him seen by Dr. Ninfa Manning in the next 4 weeks for  consideration of shoulder intervention, and will call With the results of the right shoulder MRI. 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    Plan:No lifting greater than 10 lbs. No overhead use of arms. Avoid bending,and twisting neck. Make an appointment to see Dr. Ninfa Manning after the results of the right shoulder MRI are available. Ice and heat are helpful for pain associated with the shoulder.  Alleve is helpful but talk to your cardiologist about it use because of the CAD and stents.    Follow-Up Instructions: Return in about 4 weeks (around 10/29/2016).   Orders:  Orders Placed This Encounter  Procedures  . XR Cervical Spine 2 or 3 views   No orders of the defined types were placed in this encounter.     Procedures: No procedures performed   Clinical Data: Findings:  Right shoulder MRI at [facility] neurosurgery, shows a downsloping lateral acromion with impingement on the right rotator cuff and supraspinatous, edema extends into the right supraspinatous  Tendon at least 50%, mild degenerative changes of the inferomedial glenohumeral joint and right A-C joint.    Subjective: Chief Complaint  Patient presents with  . Neck - Pain    [name] is here for his neck pain that goes into right shoulder. He states that  he was seen at [facility] and they ordered MRI, and then an epidural injection.  He said the injection helped some with the shoulder stuff, that he still has pain with in creased activity.  He did bring the MRI report and report from Cervical spine xrays from [facility] but was unable to get the actual Images from them. He would like to discuss surgery if that's what he needs for his neck. Had a right sided ESI at [facility] in 07/2016, Pain in the right posterior neck comes and goes. Decreased activity level due to neck and left foot with plantar facsitis. Used a night splint and a foot brace for a  while. MRI of the foot shows a tear of the left foot plantar fascia. Concerned that he may need intervention for his neck. Claustrophobic with MRIs.  Pain in the neck is a "5 or 6 " on occasion was 8 but not as bad as previously up to a "10". Stents for CAD about 2 yearss ago, some CP recently, he is to see his cardiologist, Dr. Nadyne Manning with [facility]. Went 2-3 weeks before AMI recognized 2 years ago.    Review of Systems   Objective: Vital Signs: BP (!) 153/79 (BP Location: Left Arm, Patient Position: Sitting)   Ht 5\' 10"  (1.778 m)   Wt 215 lb (97.5 kg)   BMI 30.85 kg/m   Physical Exam  Ortho Exam  Specialty Comments:  No specialty comments available.  Imaging: Xr Cervical Spine 2 Or 3 Views  Result Date: 10/01/2016 3 views of the cervical spine, plates and screws in good position and alignment at the C5-7 levels with solid fusion, DDD at the C4-5 level with a minimal retrolisthesis of C4 on C5. Spondylosis of the uncovertebral joints right greater than left. Previous MRI of the cervical spine report from [facility] shows small HNP right C4-5 with spondylosis right C5 nerve entrapment greater than left. Central canal wll maintained. Mild spondylosis at the C3-4 level.    PMFS History: Patient Active Problem List   Diagnosis Date Noted  . Dysphagia 06/12/2016  . Sciatica 02/06/2016  . Plantar fasciitis 01/16/2016  . Right sided sciatica 05/16/2015  . Radicular pain in right arm 04/05/2015  . Hand pain 04/05/2015  . Premature ejaculation 09/17/2014  . Heel pain 09/17/2014  . NSTEMI (non-ST elevated myocardial infarction) ([place]) 06/13/2014  . Atypical chest pain 06/06/2014  . Memory change 05/24/2014  . Benign paroxysmal positional vertigo 07/13/2013  . Orthostasis 07/13/2013  . Migraine, unspecified, without mention of intractable migraine without mention of status migrainosus 04/03/2013  . Medicare annual wellness visit, initial 02/17/2013  . Pain in  joint, ankle and foot 10/19/2012  . Irritable mood 01/18/2012  . Cough 12/21/2011  . Tobacco abuse 12/21/2011  . Skin lesion of face 04/27/2011  . Neck pain 12/03/2010  . Hearing loss 12/03/2010  . 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[place]) 10/20/2010  . HYPERCHOLESTEROLEMIA 10/20/2010  . DEPRESSION 10/20/2010  . OSTEOARTHRITIS 10/20/2010  . ARTHRITIS 10/20/2010  . NEPHROLITHIASIS, HX OF 10/20/2010  . CHICKENPOX, HX OF 10/20/2010  . ESSENTIAL HYPERTENSION, BENIGN 09/30/2010  . CORONARY ATHEROSCLEROSIS NATIVE CORONARY ARTERY 09/30/2010  . SLEEP APNEA 09/30/2010  . CHEST PAIN UNSPECIFIED 09/30/2010  . PERCUTANEOUS TRANSLUMINAL CORONARY ANGIOPLASTY, HX OF 09/30/2010   Past Medical History:  Diagnosis Date  . Anginal pain ([place])   . Basal cell carcinoma of face   . Chest pain, unspecified   . Coronary atherosclerosis of native  coronary artery    s/p stent x4  . Depression   . Diabetes mellitus without mention of complication   . Erectile dysfunction   . Essential hypertension, benign   . Heart murmur   . History of nephrolithiasis   . Migraine   . Osteoarthritis   . Postsurgical percutaneous transluminal coronary angioplasty status    s/p stent x4  . Sarcoidosis ([place])   . Unspecified sleep apnea     Family History  Problem Relation Age of Onset  . Arthritis Mother   . Depression Mother   . Stroke Mother   . Arthritis Father   . Diabetes Father   . Coronary artery disease Father   . Colon cancer Neg Hx   . Prostate cancer Neg Hx   . Esophageal cancer Neg Hx   . Rectal cancer Neg Hx   . Stomach cancer Neg Hx     Past Surgical History:  Procedure Laterality Date  . APPENDECTOMY  2005  . CARPAL TUNNEL RELEASE    . CERVICAL FUSION  2009  . ELBOW SURGERY     bilateral  . KNEE SURGERY     Bilateral   . LEFT HEART CATHETERIZATION WITH CORONARY ANGIOGRAM N/A 06/14/2014   Procedure: LEFT HEART CATHETERIZATION WITH CORONARY ANGIOGRAM;  Surgeon: Laverda Page, MD;  Location: [facility] CATH LAB;  Service: Cardiovascular;  Laterality: N/A;  . LUNG BIOPSY     sarcoid  . ROTATOR CUFF REPAIR     left   Social History   Occupational History  . disabled Unemployed   Social History Main Topics  . Smoking status: Never Smoker  . Smokeless tobacco: Former Systems developer    Types: [place] date: 02/06/2016     Comment: trying to quit  . Alcohol use No  . Drug use: No  . Sexual activity: Yes    Birth control/ protection: Condom

## 2016-10-09 DIAGNOSIS — I10 Essential (primary) hypertension: Secondary | ICD-10-CM | POA: Diagnosis not present

## 2016-10-12 ENCOUNTER — Telehealth (INDEPENDENT_AMBULATORY_CARE_PROVIDER_SITE_OTHER): Payer: Self-pay | Admitting: Specialist

## 2016-10-12 ENCOUNTER — Other Ambulatory Visit (INDEPENDENT_AMBULATORY_CARE_PROVIDER_SITE_OTHER): Payer: Self-pay | Admitting: Specialist

## 2016-10-12 MED ORDER — ALPRAZOLAM 0.5 MG PO TABS: ORAL_TABLET | ORAL | 0 refills | Status: DC

## 2016-10-12 NOTE — Telephone Encounter (Signed)
I called rx into [facility] in [facility]

## 2016-10-12 NOTE — Progress Notes (Signed)
I called rx into [facility] in [facility]

## 2016-10-12 NOTE — Telephone Encounter (Signed)
PATIENT GOING TOMORROW FOR MRI NEEDING A FEW PAIN PILLS SO HE CAN LAY FLAT AND NOT BE IN PAIN DURING THE MRI. PLEASE CALL BACK ASAP. CB # [phone]

## 2016-10-12 NOTE — Telephone Encounter (Signed)
Patient called and stated that his is suppose to have an MRI tomorrow and stated that Dr. Louanne Skye was suppose to send in a prescription for him because of his neck and unable to lay in the machine for very long without it

## 2016-10-12 NOTE — Telephone Encounter (Signed)
Patient called and stated that his is suppose to have an MRI tomorrow and stated that Dr. Louanne Skye was suppose to send in a prescription for him because of his neck and unable to lay in the machine for very long without it.  [id number].  Thank you

## 2016-10-13 ENCOUNTER — Ambulatory Visit: Admission: RE | Admit: 2016-10-13 | Payer: Medicare Other | Source: Ambulatory Visit

## 2016-10-13 ENCOUNTER — Telehealth: Payer: Self-pay | Admitting: Family Medicine

## 2016-10-13 NOTE — Telephone Encounter (Signed)
I spoke with pt and he is going to [facility] ED now. FYI to Dr [name].

## 2016-10-13 NOTE — Telephone Encounter (Signed)
[facility] Patient Name: Tony Manning DOB: [birth year] Initial Comment caller states he has severe sharp pain in lower left abd Nurse Assessment Nurse: Andria Frames, RN, [place] Date/Time (Eastern Time): 10/13/2016 12:28:45 PM Confirm and document reason for call. If symptomatic, describe symptoms. ---Caller states, he has had this pain for the last 3 or 4 hours. It was really bad. It has eased up a little bit now. It is on the lower left side abdominal pain. He has had diarrhea and nausea. Does the patient have any new or worsening symptoms? ---Yes Will a triage be completed? ---Yes Related visit to physician within the last 2 weeks? ---No Does the PT have any chronic conditions? (i.e. diabetes, asthma, etc.) ---Yes List chronic conditions. ---diabetes, htn Is this a behavioral health or substance abuse call? ---No Guidelines Guideline Title Affirmed Question Affirmed Notes Abdominal Pain - Male [1] SEVERE pain (e.g., excruciating) AND [2] present > 1 hour Final Disposition User Go to ED Now H[place], RN, [facility] - ED

## 2016-10-13 NOTE — Telephone Encounter (Signed)
This has been taken care of.

## 2016-10-13 NOTE — Telephone Encounter (Signed)
Rx for xanax 0.5 mg #2 tablets prescribed and printed for patient to pick up 10/12/2016.

## 2016-10-14 ENCOUNTER — Ambulatory Visit (INDEPENDENT_AMBULATORY_CARE_PROVIDER_SITE_OTHER): Payer: Medicare HMO | Admitting: *Deleted

## 2016-10-14 DIAGNOSIS — M2041 Other hammer toe(s) (acquired), right foot: Secondary | ICD-10-CM

## 2016-10-14 DIAGNOSIS — L84 Corns and callosities: Secondary | ICD-10-CM

## 2016-10-14 DIAGNOSIS — E1149 Type 2 diabetes mellitus with other diabetic neurological complication: Secondary | ICD-10-CM

## 2016-10-14 DIAGNOSIS — M2042 Other hammer toe(s) (acquired), left foot: Secondary | ICD-10-CM

## 2016-10-14 NOTE — Progress Notes (Signed)
Measured for diabetic shoes and insoles today. Will place order and notify patient on arrival. 

## 2016-10-14 NOTE — Telephone Encounter (Signed)
I will await the emergency room notes. Thanks.

## 2016-10-15 ENCOUNTER — Encounter (INDEPENDENT_AMBULATORY_CARE_PROVIDER_SITE_OTHER): Payer: Self-pay | Admitting: Specialist

## 2016-10-15 ENCOUNTER — Ambulatory Visit (INDEPENDENT_AMBULATORY_CARE_PROVIDER_SITE_OTHER): Payer: Medicare HMO | Admitting: Specialist

## 2016-10-15 VITALS — BP 123/72 | HR 64 | Ht 70.0 in | Wt 215.0 lb

## 2016-10-15 DIAGNOSIS — M7581 Other shoulder lesions, right shoulder: Secondary | ICD-10-CM

## 2016-10-15 DIAGNOSIS — I209 Angina pectoris, unspecified: Secondary | ICD-10-CM | POA: Diagnosis present

## 2016-10-15 DIAGNOSIS — R29898 Other symptoms and signs involving the musculoskeletal system: Secondary | ICD-10-CM

## 2016-10-15 DIAGNOSIS — M5020 Other cervical disc displacement, unspecified cervical region: Secondary | ICD-10-CM

## 2016-10-15 DIAGNOSIS — M778 Other enthesopathies, not elsewhere classified: Secondary | ICD-10-CM

## 2016-10-15 NOTE — Progress Notes (Signed)
Office Visit Note   Patient: Tony Manning           Date of Birth: 1961-06-09           MRN: [id number] Visit Date: 10/15/2016              Requested by: Tony Ghent, MD Tony Manning, Tony Manning [id number] PCP: Tony Stain, MD   Assessment & Plan: Visit Diagnoses:  1. Herniated disc, cervical   2. Right shoulder tendonitis     Plan: Avoid overhead lifting and overhead use of the arms. Do not lift greater than 5 lbs. Adjust head rest in vehicle to prevent hyperextension if rear ended. Take extra precautions to avoid falling, including use of a cane if you feel weak. Scheduling secretary Tony Manning. will call you to arrange for surgery for your cervical spine. If you wish a second opinion please let us know and we can arrange for you. If you have worsening arm or leg numbness or weakness please call or go to an ER. We will contact your cardiologist and primary care physicians to seek clearance for your surgery. Surgery will be an anterior discectomy and fusion C4-5 with placement of a Zero P implant, Local bone graft a s well and allograft bone graft and vivigen.  Risks of surgery include risks of infection, bleeding and risks to the spinal cord and  Risks of sore throat and difficulty swallowing which should  Improve over the next 4-6 weeks following surgery. Surgery is indicated due to upper extremity radiculopathy. In the future surgery at adjacent levels may be necessary but these levels do not appear to be related to your current symptoms or signs.    Follow-Up Instructions: Return in about 4 weeks (around 11/12/2016) for post op .   Orders:  Orders Placed This Encounter  Procedures  . DG Arthro Shoulder Right  . CT SHOULDER RIGHT W CONTRAST   No orders of the defined types were placed in this encounter.     Procedures: No procedures performed   Clinical Data: No additional findings.   Subjective: Chief Complaint  Patient presents  with  . Right Shoulder - Pain  . Neck - Pain    [name] is here for neck and right shoulder pain.  He was scheduled for MRI but wasn't able to do the scan due to not able to lay down. He states that the medication didn't take effect till 4pm and his scan was @ 9am  Patient states that he wants to get his neck fixed first.  The Naproxen doesn't help him.    Review of Systems  Constitutional: Negative.   HENT: Negative.   Eyes: Negative.   Respiratory: Negative.   Cardiovascular: Negative.   Gastrointestinal: Negative.   Endocrine: Negative.   Genitourinary: Negative.   Musculoskeletal: Negative.   Skin: Negative.   Allergic/Immunologic: Negative.   Neurological: Negative.   Hematological: Negative.   Psychiatric/Behavioral: Negative.      Objective: Vital Signs: BP 123/72   Pulse 64   Ht 5\' 10"  (1.778 m)   Wt 215 lb (97.5 kg)   BMI 30.85 kg/m   Physical Exam  Constitutional: He is oriented to person, place, and time. He appears well-developed and well-nourished.  HENT:  Head: Normocephalic and atraumatic.  Eyes: EOM are normal. Pupils are equal, round, and reactive to light.  Neck: Normal range of motion. Neck supple.  Pulmonary/Chest: Effort normal and breath sounds normal.  Abdominal:  Soft. Bowel sounds are normal.  Neurological: He is alert and oriented to person, place, and time.  Skin: Skin is warm and dry.  Psychiatric: He has a normal mood and affect. His behavior is normal. Judgment and thought content normal.    Back Exam   Tenderness  The patient is experiencing tenderness in the cervical.  Range of Motion  Extension: abnormal  Flexion: abnormal  Lateral Bend Right: abnormal  Lateral Bend Left: normal  Rotation Right: abnormal  Rotation Left: normal   Muscle Strength  Right Quadriceps:  5/5  Left Quadriceps:  5/5  Right Hamstrings:  5/5  Left Hamstrings:  5/5   Tests  Straight leg raise right: negative Straight leg raise left:  negative  Reflexes  Patellar: normal Achilles: normal Biceps:  0/4 abnormal Babinski's sign: normal   Other  Toe Walk: normal Heel Walk: normal Sensation: normal Gait: normal   Comments:  Weak right shouder abduction. 4/5   Right Shoulder Exam   Tenderness  The patient is experiencing tenderness in the acromion.  Range of Motion  Active Abduction: abnormal  Passive Abduction: abnormal  Extension: abnormal   Muscle Strength  Abduction: 4/5  Internal Rotation: 5/5  External Rotation: 4/5  Supraspinatus: 4/5  Subscapularis: 5/5  Biceps: 5/5   Tests  Apprehension: negative Drop Arm: negative Hawkin's test: negative Impingement: positive Sulcus: absent  Other  Erythema: absent Scars: absent Sensation: normal Pulse: present      Specialty Comments:  No specialty comments available.  Imaging: No results found.   PMFS History: Patient Active Problem List   Diagnosis Date Noted  . Dysphagia 06/12/2016  . Sciatica 02/06/2016  . Plantar fasciitis 01/16/2016  . Right sided sciatica 05/16/2015  . Radicular pain in right arm 04/05/2015  . Hand pain 04/05/2015  . Premature ejaculation 09/17/2014  . Heel pain 09/17/2014  . NSTEMI (non-ST elevated myocardial infarction) ([place]) 06/13/2014  . Atypical chest pain 06/06/2014  . Memory change 05/24/2014  . Benign paroxysmal positional vertigo 07/13/2013  . Orthostasis 07/13/2013  . Migraine, unspecified, without mention of intractable migraine without mention of status migrainosus 04/03/2013  . Medicare annual wellness visit, initial 02/17/2013  . Pain in joint, ankle and foot 10/19/2012  . Irritable mood 01/18/2012  . Cough 12/21/2011  . Tobacco abuse 12/21/2011  . Skin lesion of face 04/27/2011  . Neck pain 12/03/2010  . Hearing loss 12/03/2010  . 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 HYPERCHOLESTEROLEMIA 10/20/2010  . DEPRESSION 10/20/2010  . OSTEOARTHRITIS 10/20/2010   . ARTHRITIS 10/20/2010  . NEPHROLITHIASIS, HX OF 10/20/2010  . CHICKENPOX, HX OF 10/20/2010  . ESSENTIAL HYPERTENSION, BENIGN 09/30/2010  . CORONARY ATHEROSCLEROSIS NATIVE CORONARY ARTERY 09/30/2010  . SLEEP APNEA 09/30/2010  . CHEST PAIN UNSPECIFIED 09/30/2010  . PERCUTANEOUS TRANSLUMINAL CORONARY ANGIOPLASTY, HX OF 09/30/2010   Past Medical History:  Diagnosis Date  . Anginal pain ([place])   . Basal cell carcinoma of face   . Chest pain, unspecified   . Coronary atherosclerosis of native coronary artery    s/p stent x4  . Depression   . Diabetes mellitus without mention of complication   . Erectile dysfunction   . Essential hypertension, benign   . Heart murmur   . History of nephrolithiasis   . Migraine   . Osteoarthritis   . Postsurgical percutaneous transluminal coronary angioplasty status    s/p stent x4  . Sarcoidosis ([place])   . Unspecified sleep apnea     Family History  Problem Relation Age of Onset  . Arthritis Mother   . Depression Mother   . Stroke Mother   . Arthritis Father   . Diabetes Father   . Coronary artery disease Father   . Colon cancer Neg Hx   . Prostate cancer Neg Hx   . Esophageal cancer Neg Hx   . Rectal cancer Neg Hx   . Stomach cancer Neg Hx     Past Surgical History:  Procedure Laterality Date  . APPENDECTOMY  2005  . CARPAL TUNNEL RELEASE    . CERVICAL FUSION  2009  . ELBOW SURGERY     bilateral  . KNEE SURGERY     Bilateral   . LEFT HEART CATHETERIZATION WITH CORONARY ANGIOGRAM N/A 06/14/2014   Procedure: LEFT HEART CATHETERIZATION WITH CORONARY ANGIOGRAM;  Surgeon: Laverda Page, MD;  Location: [facility] CATH LAB;  Service: Cardiovascular;  Laterality: N/A;  . LUNG BIOPSY     sarcoid  . ROTATOR CUFF REPAIR     left   Social History   Occupational History  . disabled Unemployed   Social History Main Topics  . Smoking status: Never Smoker  . Smokeless tobacco: Former Systems developer    Types: [place] date: 02/06/2016     Comment:  trying to quit  . Alcohol use No  . Drug use: No  . Sexual activity: Yes    Birth control/ protection: Condom

## 2016-10-15 NOTE — H&P (Signed)
OFFICE VISIT NOTES COPIED TO EPIC FOR DOCUMENTATION  . History of Present Illness Laverda Page MD; 10/01/2016 7:35 PM) Patient words: Last O/V 06/24/2016; Acute Visit for CP, SOB - Pt would like to know if he could take Naproxen for Shoulder/Neck Pain.  The patient is a 56 year old male who presents for a who presents for a preoperative evaluation.  Additional reasons for visit:  Follow-up for CAD is described as the following: He has a history of diabetes, hypertension, and hyperlipidemia who was had severe diffuse disease of his coronaries, has 2 stents in the right coronary artery and one stent in the mid LAD. On 06/13/14 admitted to the hospital for NSTEMI and cardiac cath on 06/14/2014 had thrombectomy of the mid RCA thrombus lesion,and stenting of the mid RCA with implantation of a 3.0 x 18 mm Xience Alpine DES. PTCA and stenting of the mid LAD with implantation of a 3.0 x 18 mm Xience Alpine DES.  He has had a nuclear stress test in September 2017 which had revealed small size moderate to severe ischemia involving the apical anterior and anterolateral wall in the distribution of mid to distal LAD. EF was estimated at 46% and considered to be intermediate risk study. We had considered medical therapy at this point and he had done well until now and he called our office stating that for the past one week he has noticed worsening dyspnea on exertion and also episodes of chest pain with or without activity, in the left upper part of the chest. No radiation. CP can last an hour and sometimes few minutes.  His noticed decreased exercise capacity. He has severe degenerative joint disease involving cervical spine and also severe arthritis of the right shoulder and also left shoulder, he thinks that he will need surgery on his right shoulder in the near future followed by the left shoulder probably in the next one month. He also has plantar fasciitis involving the left leg.  Diabetes  continues to be uncontrolled. His blood pressure has been an issue, however increasing the medication dosage units to marked dizziness and fatigue. He denies PND, orthopnea, dizziness, or edema. He is tolerating DAPT with ASA and plavix and denies any bleeding diathesis. Tolerating all other medications well.   Problem List/Past Medical (April Garrison; 10/01/2016 1:09 PM) Atherosclerosis of native coronary artery of native heart without angina pectoris (I25.10)  06/14/2014: Thrombectomy of the mid RCA thrombus lesion, with a Fetch 2 aspiration catheter, PTCA and stenting of the mid RCA with implantation of a 3.0 x 18 mm Xience Alpine DES. PTCA and stenting of the mid LAD with implantation of a 3.0 x 18 mm Xience Alpine DES. CAD s/p 2.75x12 mm Taxus in mid LAD, 2.5x12 , Distal RCA stent x 2 in 2004 and 2007. Last Cardiac cath 6/08 patent stents. Lexiscan myoview stress test 06/05/2016: 1. The resting electrocardiogram demonstrated normal sinus rhythm, normal resting conduction, no resting arrhythmias and nonspecific T changes. Stress EKG is non-diagnostic for ischemia as it a pharmacologic stress using Lexiscan. Stress symptoms included dyspnea. 2. The perfusion imaging study demonstrates a small sized moderate to severe ischemia involving the apical anterior, apical septal and apical anterolateral wall probably in the distribution of mid to distal LAD. Left ventricular systolic function calculated by QGS was mildly depressed at 46%. Compared to the study done on 10/31/2010, ischemia is new. Disease and intermediate risk scan. Labwork  Labs 08/10/2016: A1c 7.9%. Labs 05/12/2016: HbA1c 7.9%, total cholesterol 120, triglycerides 101,  HDL 41, LDL 58. BUN 17, serum creatinine 1.01, potassium 4.5, serum glucose 149. CBC normal, platelets 212. 05/20/2015: Total cholesterol 168, triglycerides 135, HDL 41.7, LDL 99, serum glucose 149, creatinine 0.98, potassium 4.0, CMP otherwise normal, HbA1c 6.5% 09/14/2014: HbA1c 6.6%  05/17/2014: HbA1c 7.6%. 11/09/2013: Total cholesterol 160, triglycerides 140, HDL 40, LDL 91. CMP normal Uncontrolled type 2 diabetes mellitus with complication, with long-term current use of insulin (E11.8, E11.65)  Hyperlipidemia, mild (E78.5)  Chest pain at rest (R07.9)  Gastroesophageal reflux disease without esophagitis (K21.9)  Unstable angina pectoris (I20.0)  History of PTCA (Z98.61)  06/14/2014: Thrombectomy of the mid RCA thrombus lesion, with a Fetch 2 aspiration catheter, Stenting mid RCA with 3.0 x 18 mm Xience Alpine DES. Stenting mid LAD 3.0 x 18 mm Xience Alpine DES. Patent Mid LAD 2.75x12 mm Taxus, 2.5x12 Distal RCA stent x 2 from 2004 and 2007. 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 Gabapentin *ANTICONVULSANTS*  TraMADol HCl *ANALGESICS - OPIOID*  Ambien *HYPNOTICS/SEDATIVES/SLEEP DISORDER AGENTS*   Family History (April Garrison; 10/09/2016 1:09 PM) Mother  Deceased. from pneumonia, Hx of several mini strokes, CA Father  Living - Has had a mild heart attack Brother 2  Older; living-in good health  Social History (April Garrison; 10-09-2016 1:09 PM) Non Drinker/No Alcohol Use  Marital status  Married. Living Situation  Lives with spouse. Number of Children  2. Current tobacco use  Never smoker.  Past Surgical History (April Garrison; Oct 09, 2016 1:09 PM) Neck Fusion in 2009  Right shoulder surgery 02/26/11.  Right knee surgery 12/25/10  Coronary Angioplasty  2 stents placed  Medication History (April Garrison; 2016-10-09 1:13 PM) Losartan Potassium (100MG  Tablet, 1/2 Oral daily after a meal, Taken starting 06/24/2016) Active. (Decreased from 1/qd by PCP due to  Fatigue) Carvedilol (6.25MG  Tablet, 1 (one) Tablet Tablet Table Oral two times daily, Taken starting 01/01/2016) Active. (Pls disregard 30 day rx. Thanks) Clopidogrel Bisulfate (75MG  Tablet, 1 Tablet Tablet Tablet Oral daily, Taken starting 01/01/2016) Active. (Pls disregard 30 day Rx. Thanks) Glimepiride (4MG  Tablet, 1 tablet Oral two times daily) Active. Aspirin (81MG  Tablet DR, 1 tablet Oral daily) Active. MetFORMIN HCl (1000MG  Tablet, 1 Oral two times daily) Active. Levemir FlexTouch (100UNIT/ML Soln Pen-inj, Subcutaneous Sliding scale) Active. Atorvastatin Calcium (40MG  Tablet, 1 Oral daily) Active. Ibuprofen (600MG  Tablet, 1 Oral every 6 hours as needed for pain) Active. Amoxicillin (500MG  Capsule, 3 Oral daily) Discontinued: Completed course. Oxycodone-Acetaminophen (5-325MG  Tablet, 1 Oral daily) Discontinued: Completed course. Medications Reconciled (Verbally w/ pt)  Diagnostic Studies History (April Garrison; 10-09-2016 1:09 PM) ECG 06/11/11: NSR, normal intervals. No ischemia. Normal QT  Sleep study 04/23/11: Mild obstructive sleep apnea. CPAP recommended  Nuclear stress 2.24.12 Normal Perfusion. Inferoapical ischemia noted 2009 not present. Normal EF  CAD s/p 2.75x12 mm Taxus in mid LAD, 2.5x12 , Distal RCA stent in 2004 and 2007 respectively 03/01/2007 Patent stents Colonoscopy 2014 Normal. Coronary Angiogram 06/14/2014 Left heart catheterization including hemodynamic monitoring of the left ventricle, LV gram. Selective right and left coronary arteriography. Thrombectomy of the mid RCA thrombus lesion, with a Fetch 2 aspiration catheter, PTCA and stenting of the mid RCA with implantation of a 3.0 x 18 mm Xience Alpine DES. PTCA and stenting of the mid LAD with implantation of a 3.0 x 18 mm  Xience Alpine DES. Nuclear stress test 06/05/2016 1. The resting electrocardiogram demonstrated normal sinus rhythm, normal resting conduction, no resting arrhythmias and nonspecific  T changes. Stress EKG is non-diagnostic for ischemia as it a pharmacologic stress using Lexiscan. Stress symptoms included dyspnea. 2. The perfusion imaging study demonstrates a small sized moderate to severe ischemia involving the apical anterior, apical septal and apical anterolateral wall probably in the distribution of mid to distal LAD. Left ventricular systolic function calculated by QGS was mildly depressed at 46%. Compared to the study done on 10/31/2010, ischemia is new. Disease and intermediate risk scan. MRI 06/17/2016 Neck & Shoulders @ [facility]  Other Problems (April Louretta Shorten; 10/01/2016 1:09 PM) Unspecified Diagnosis  Severe high grade disease in Cx with 90% mid and distal 90% at trifurcation stenosis     Review of Systems Laverda Page MD; 10/01/2016 1:58 PM) General Present- Feeling well. Not Present- Fatigue, Fever and Night Sweats. Skin Not Present- Itching and Rash. HEENT Not Present- Headache. Respiratory Present- Decreased Exercise Tolerance and Difficulty Breathing on Exertion. Cardiovascular Present- Chest Pain. Not Present- Claudications, Fainting, Orthopnea and Swelling of Extremities. Gastrointestinal Present- Heartburn (chronic). Not Present- Abdominal Pain, Constipation, Diarrhea and Vomiting. Musculoskeletal Present- Joint Pain (Neck and right shoulder worse than the left, left foot, states that he has plantar fasciitis). Not Present- Joint Swelling. Neurological Not Present- Headaches. Hematology Not Present- Blood Clots, Easy Bruising and Nose Bleed.  Vitals (April Garrison; 10/01/2016 1:16 PM) 10/01/2016 1:09 PM Weight: 218.31 lb Height: 70.5in Body Surface Area: 2.18 m Body Mass Index: 30.88 kg/m  Pulse: 74 (Regular)  P.OX: 99% (Room air) BP: 144/86 (Sitting, Left Arm, Standard)       Physical Exam Laverda Page, MD; 10/01/2016 7:35 PM) General Mental Status-Alert. General Appearance-Cooperative and Appears stated  age. Build & Nutrition-Moderately built and Mildly obese.  Head and Neck Thyroid Gland Characteristics - normal size and consistency and no palpable nodules.  Chest and Lung Exam Chest and lung exam reveals -quiet, even and easy respiratory effort with no use of accessory muscles, non-tender and on auscultation, normal breath sounds, no adventitious sounds.  Cardiovascular Cardiovascular examination reveals -normal heart sounds, regular rate and rhythm with no murmurs, carotid auscultation reveals no bruits, abdominal aorta auscultation reveals no bruits and no prominent pulsation, femoral artery auscultation bilaterally reveals normal pulses, no bruits, no thrills, normal pedal pulses bilaterally and no digital clubbing, cyanosis, edema, increased warmth or tenderness.  Abdomen Palpation/Percussion Normal exam - Non Tender and No hepatosplenomegaly.  Neurologic Neurologic evaluation reveals -alert and oriented x 3 with no impairment of recent or remote memory. Motor-Grossly intact without any focal deficits.  Musculoskeletal Global Assessment Left Lower Extremity - no deformities, masses or tenderness, no known fractures. Right Lower Extremity - no deformities, masses or tenderness, no known fractures.    Assessment & Plan Laverda Page MD; 10/01/2016 7:37 PM) Atherosclerosis of native coronary artery of native heart with angina pectoris (I25.119) Story: 06/14/2014: Thrombectomy of the mid RCA thrombus lesion, with a Fetch 2 aspiration catheter, stenting of the mid RCA with 3.0 x 18 mm Xience Alpine DES. Stenting of mid LAD 3.0 x 18 mm Xience Alpine DES.  CAD s/p 2.75x12 mm Taxus in mid LAD, 2.5x12 , Distal RCA stent x 2 in 2004 and 2007.  Lexiscan myoview stress test 06/05/2016: 1. The resting electrocardiogram demonstrated normal sinus rhythm, normal resting conduction, no resting arrhythmias and nonspecific T changes. Stress EKG is non-diagnostic for ischemia as it  a pharmacologic stress using Lexiscan. Stress symptoms included dyspnea. 2. The perfusion imaging study demonstrates a small sized moderate to severe ischemia involving the apical anterior, apical septal and apical anterolateral wall probably in the distribution of mid to distal LAD. Left ventricular systolic function calculated by QGS was mildly depressed at 46%. Compared to the study done on 10/31/2010, ischemia is new. Intermediate risk scan. Impression: EKG 10/01/2016: Normal sinus rhythm at the rate of 67 bpm, normal axis. Poor R-wave progression, cannot exclude anterior infarct old. No evidence of ischemia, normal QT interval. No significant change from EKG 05/13/2016. Current Plans Started Nitrostat 0.4MG , 1 (one) Tablet every 5 minutes as needed for chest pain., #25, 10/01/2016, Ref. x3. Started Isosorbide Mononitrate ER 60MG , 1 (one) Tablet daily, #30, 10/01/2016, Ref. x2. 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 x2. Hyperlipidemia, mild (E78.5) Obstructive sleep apnea on CPAP (G47.33) Labwork Story: Labs 08/10/2016: A1c 7.9%.  Labs 05/12/2016: HbA1c 7.9%, total cholesterol 120, triglycerides 101, HDL 41, LDL 58. BUN 17, serum creatinine 1.01, potassium 4.5, serum glucose 149. CBC normal, platelets 212.  05/20/2015: Total cholesterol 168, triglycerides 135, HDL 41.7, LDL 99, serum glucose 149, creatinine 0.98, potassium 4.0, CMP otherwise normal, HbA1c 6.5%  09/14/2014: HbA1c 6.6%  05/17/2014: HbA1c 7.6%.  11/09/2013: Total cholesterol 160, triglycerides 140, HDL 40, LDL 91. CMP normal Uncontrolled type 2 diabetes mellitus with complication, with long-term current use of insulin (E11.8) Current Plans Mechanism of underlying disease process and action of medications discussed with  the patient. I discussed primary/secondary prevention and also dietary counseling was done. Patient's symptoms are very atypical for angina pectoris, chest pain lasting for several hours sometimes and sometimes lasting only a few minutes. However he has had a intermediate risk study with a nuclear scan a year ago. I suspect he can undergo shoulder surgery with acceptable cardiovascular risk as this intermediate risk surgery, however I would like to optimize his care, added sublingual nitroglycerin to see whether his chest and symptoms were response to sublingual nitroglycerin. If she continues to have exertional chest pain and dyspnea, probably proceeding directly with coronary angiography would be more appropriate in view of prior abnormal stress test.  Also added isosorbide mononitrate to be taken on a daily basis. He is hypertensive and office and recheck blood pressure also remains high, 140/94 mmHg, hence increase losartan from 50 mg to 50 mg b.i.d. I would like to see him back in 3 weeks for follow-up. He has not had sleep evaluation performed in greater than 2 years and I have advised him that anginal symptoms can also get worse with uncontrolled sleep apnea along with elevated blood pressure. He'll make an appointment to see Dr. Humphrey Rolls.  CC: Oliver Barre, MD; Dr. Devona Konig, MD (Sleep Medicine)  Signed by Laverda Page, MD (10/01/2016 7:38 PM)  Addendum: Patient called our office stating that he is unable to take isosorbide mononitrate due to severe headaches.  He continues to complain SEVERE exertional pain.  I discussed with the phone that next option is to proceed directly with angiography in view of prior abnormal stress test and persistent symptoms in spite of aggressive medical therapy and ongoing cardiac risk factors.  He is willing to proceed and understands the risks and benefits associated with this.

## 2016-10-15 NOTE — Patient Instructions (Addendum)
Avoid overhead lifting and overhead use of the arms. Do not lift greater than 5 lbs. Adjust head rest in vehicle to prevent hyperextension if rear ended. Take extra precautions to avoid falling, including use of a cane if you feel weak. Scheduling secretary Kandice Hams. will call you to arrange for surgery for your cervical spine. If you wish a second opinion please let us know and we can arrange for you. If you have worsening arm or leg numbness or weakness please call or go to an ER. We will contact your cardiologist and primary care physicians to seek clearance for your surgery. Surgery will be an anterior discectomy and fusion C4-5 with placement of a Zero P implant, Local bone graft a s well and allograft bone graft and vivigen.  Risks of surgery include risks of infection, bleeding and risks to the spinal cord and  Risks of sore throat and difficulty swallowing which should  Improve over the next 4-6 weeks following surgery. Surgery is indicated due to upper extremity radiculopathy. In the future surgery at adjacent levels may be necessary but these levels do not appear to be related to your current symptoms or signs.  We are scheduling you for the right shoulder CT arthrogram to assess for a rotator cuff tear.

## 2016-10-16 ENCOUNTER — Ambulatory Visit (HOSPITAL_COMMUNITY)
Admission: RE | Admit: 2016-10-16 | Discharge: 2016-10-16 | Disposition: A | Discharge status: Home / Self Care | Payer: Medicare HMO | Source: Ambulatory Visit | Attending: Cardiology | Admitting: Cardiology

## 2016-10-16 ENCOUNTER — Encounter (HOSPITAL_COMMUNITY)
Admission: RE | Disposition: A | Discharge status: Home / Self Care | Payer: Self-pay | Source: Ambulatory Visit | Attending: Cardiology

## 2016-10-16 DIAGNOSIS — I10 Essential (primary) hypertension: Secondary | ICD-10-CM | POA: Diagnosis not present

## 2016-10-16 DIAGNOSIS — M479 Spondylosis, unspecified: Secondary | ICD-10-CM | POA: Diagnosis not present

## 2016-10-16 DIAGNOSIS — I209 Angina pectoris, unspecified: Secondary | ICD-10-CM | POA: Diagnosis not present

## 2016-10-16 DIAGNOSIS — R51 Headache: Secondary | ICD-10-CM | POA: Diagnosis not present

## 2016-10-16 DIAGNOSIS — Z794 Long term (current) use of insulin: Secondary | ICD-10-CM | POA: Diagnosis not present

## 2016-10-16 DIAGNOSIS — I25118 Atherosclerotic heart disease of native coronary artery with other forms of angina pectoris: Secondary | ICD-10-CM | POA: Diagnosis not present

## 2016-10-16 DIAGNOSIS — T82855A Stenosis of coronary artery stent, initial encounter: Secondary | ICD-10-CM | POA: Diagnosis not present

## 2016-10-16 DIAGNOSIS — E785 Hyperlipidemia, unspecified: Secondary | ICD-10-CM | POA: Insufficient documentation

## 2016-10-16 DIAGNOSIS — M4802 Spinal stenosis, cervical region: Secondary | ICD-10-CM | POA: Insufficient documentation

## 2016-10-16 DIAGNOSIS — Y712 Prosthetic and other implants, materials and accessory cardiovascular devices associated with adverse incidents: Secondary | ICD-10-CM | POA: Insufficient documentation

## 2016-10-16 DIAGNOSIS — Z955 Presence of coronary angioplasty implant and graft: Secondary | ICD-10-CM

## 2016-10-16 DIAGNOSIS — K219 Gastro-esophageal reflux disease without esophagitis: Secondary | ICD-10-CM | POA: Insufficient documentation

## 2016-10-16 DIAGNOSIS — M19012 Primary osteoarthritis, left shoulder: Secondary | ICD-10-CM | POA: Diagnosis not present

## 2016-10-16 DIAGNOSIS — I2510 Atherosclerotic heart disease of native coronary artery without angina pectoris: Secondary | ICD-10-CM | POA: Diagnosis present

## 2016-10-16 DIAGNOSIS — Z8249 Family history of ischemic heart disease and other diseases of the circulatory system: Secondary | ICD-10-CM | POA: Insufficient documentation

## 2016-10-16 DIAGNOSIS — M19011 Primary osteoarthritis, right shoulder: Secondary | ICD-10-CM | POA: Insufficient documentation

## 2016-10-16 DIAGNOSIS — I252 Old myocardial infarction: Secondary | ICD-10-CM | POA: Insufficient documentation

## 2016-10-16 DIAGNOSIS — Z823 Family history of stroke: Secondary | ICD-10-CM | POA: Diagnosis not present

## 2016-10-16 DIAGNOSIS — Z7982 Long term (current) use of aspirin: Secondary | ICD-10-CM | POA: Insufficient documentation

## 2016-10-16 DIAGNOSIS — E1165 Type 2 diabetes mellitus with hyperglycemia: Secondary | ICD-10-CM | POA: Diagnosis not present

## 2016-10-16 DIAGNOSIS — G4733 Obstructive sleep apnea (adult) (pediatric): Secondary | ICD-10-CM | POA: Diagnosis not present

## 2016-10-16 HISTORY — PX: LEFT HEART CATH AND CORONARY ANGIOGRAPHY: CATH118249

## 2016-10-16 HISTORY — PX: CORONARY STENT INTERVENTION: CATH118234

## 2016-10-16 LAB — CBC
HCT: 43.6 % (ref 39.0–52.0)
Hemoglobin: 14.5 g/dL (ref 13.0–17.0)
MCH: 29.9 pg (ref 26.0–34.0)
MCHC: 33.3 g/dL (ref 30.0–36.0)
MCV: 89.9 fL (ref 78.0–100.0)
Platelets: 203 10*3/uL (ref 150–400)
RBC: 4.85 MIL/uL (ref 4.22–5.81)
RDW: 13.1 % (ref 11.5–15.5)
WBC: 7.5 10*3/uL (ref 4.0–10.5)

## 2016-10-16 LAB — PROTIME-INR
INR: 0.97
Prothrombin Time: 12.9 seconds (ref 11.4–15.2)

## 2016-10-16 LAB — GLUCOSE, CAPILLARY
Glucose-Capillary: 218 mg/dL — ABNORMAL HIGH (ref 65–99)
Glucose-Capillary: 126 mg/dL — ABNORMAL HIGH (ref 65–99)

## 2016-10-16 LAB — POCT ACTIVATED CLOTTING TIME: Activated Clotting Time: 268 seconds

## 2016-10-16 SURGERY — LEFT HEART CATH AND CORONARY ANGIOGRAPHY
Anesthesia: LOCAL

## 2016-10-16 MED ORDER — HYDRALAZINE HCL 20 MG/ML IJ SOLN: 5.0000 mg | INTRAMUSCULAR | Status: AC | PRN

## 2016-10-16 MED ORDER — SODIUM CHLORIDE 0.9% FLUSH: 3.0000 mL | Freq: Two times a day (BID) | INTRAVENOUS | Status: DC

## 2016-10-16 MED ORDER — SODIUM CHLORIDE 0.9 % WEIGHT BASED INFUSION: 1.0000 mL/kg/h | INTRAVENOUS | Status: DC

## 2016-10-16 MED ORDER — SODIUM CHLORIDE 0.9 % WEIGHT BASED INFUSION
1.0000 mL/kg/h | INTRAVENOUS | Status: AC
  Administered 2016-10-16: 1 mL/kg/h via INTRAVENOUS

## 2016-10-16 MED ORDER — MIDAZOLAM HCL 2 MG/2ML IJ SOLN
INTRAMUSCULAR | Status: AC
  Filled 2016-10-16: qty 2

## 2016-10-16 MED ORDER — VERAPAMIL HCL 2.5 MG/ML IV SOLN
INTRA_ARTERIAL | Status: DC | PRN
  Administered 2016-10-16: 5 mL via INTRA_ARTERIAL

## 2016-10-16 MED ORDER — NITROGLYCERIN 1 MG/10 ML FOR IR/CATH LAB
INTRA_ARTERIAL | Status: DC | PRN
  Administered 2016-10-16: 200 ug via INTRACORONARY

## 2016-10-16 MED ORDER — TICAGRELOR 90 MG PO TABS
90.0000 mg | ORAL_TABLET | Freq: Once | ORAL | Status: AC
  Administered 2016-10-16: 90 mg via ORAL
  Filled 2016-10-16 (×2): qty 1

## 2016-10-16 MED ORDER — HYDROMORPHONE HCL 1 MG/ML IJ SOLN
INTRAMUSCULAR | Status: AC
  Filled 2016-10-16: qty 1

## 2016-10-16 MED ORDER — IOPAMIDOL (ISOVUE-370) INJECTION 76%
INTRAVENOUS | Status: AC
  Filled 2016-10-16: qty 50

## 2016-10-16 MED ORDER — SODIUM CHLORIDE 0.9 % IV SOLN: 250.0000 mL | INTRAVENOUS | Status: DC | PRN

## 2016-10-16 MED ORDER — HEPARIN (PORCINE) IN NACL 2-0.9 UNIT/ML-% IJ SOLN
INTRAMUSCULAR | Status: AC
  Filled 2016-10-16: qty 1000

## 2016-10-16 MED ORDER — FENTANYL CITRATE (PF) 100 MCG/2ML IJ SOLN
INTRAMUSCULAR | Status: AC
  Administered 2016-10-16: 50 ug
  Filled 2016-10-16: qty 2

## 2016-10-16 MED ORDER — VERAPAMIL HCL 2.5 MG/ML IV SOLN
INTRAVENOUS | Status: AC
  Filled 2016-10-16: qty 2

## 2016-10-16 MED ORDER — VERAPAMIL HCL 2.5 MG/ML IV SOLN: INTRAVENOUS | Status: DC | PRN

## 2016-10-16 MED ORDER — HEPARIN (PORCINE) IN NACL 2-0.9 UNIT/ML-% IJ SOLN
INTRAMUSCULAR | Status: DC | PRN
  Administered 2016-10-16: 1000 mL via INTRA_ARTERIAL

## 2016-10-16 MED ORDER — NITROGLYCERIN 1 MG/10 ML FOR IR/CATH LAB
INTRA_ARTERIAL | Status: AC
  Filled 2016-10-16: qty 10

## 2016-10-16 MED ORDER — HEPARIN SODIUM (PORCINE) 1000 UNIT/ML IJ SOLN
INTRAMUSCULAR | Status: DC | PRN
  Administered 2016-10-16: 6000 [IU] via INTRAVENOUS
  Administered 2016-10-16: 3000 [IU] via INTRAVENOUS

## 2016-10-16 MED ORDER — SODIUM CHLORIDE 0.9% FLUSH: 3.0000 mL | INTRAVENOUS | Status: DC | PRN

## 2016-10-16 MED ORDER — TICAGRELOR 90 MG PO TABS
ORAL_TABLET | ORAL | Status: AC
  Filled 2016-10-16: qty 1

## 2016-10-16 MED ORDER — MIDAZOLAM HCL 2 MG/2ML IJ SOLN
INTRAMUSCULAR | Status: DC | PRN
  Administered 2016-10-16 (×2): 1 mg via INTRAVENOUS
  Administered 2016-10-16: 2 mg via INTRAVENOUS

## 2016-10-16 MED ORDER — IOPAMIDOL (ISOVUE-370) INJECTION 76%
INTRAVENOUS | Status: AC
  Filled 2016-10-16: qty 100

## 2016-10-16 MED ORDER — ANGIOPLASTY BOOK
Freq: Once | Status: AC
  Administered 2016-10-16: 15:00:00
  Filled 2016-10-16: qty 1

## 2016-10-16 MED ORDER — SODIUM CHLORIDE 0.9 % WEIGHT BASED INFUSION
3.0000 mL/kg/h | INTRAVENOUS | Status: DC
  Administered 2016-10-16: 3 mL/kg/h via INTRAVENOUS

## 2016-10-16 MED ORDER — HYDROMORPHONE HCL 1 MG/ML IJ SOLN
INTRAMUSCULAR | Status: DC | PRN
  Administered 2016-10-16: 0.5 mg via INTRAVENOUS

## 2016-10-16 MED ORDER — METFORMIN HCL 1000 MG PO TABS: ORAL_TABLET | ORAL | 1 refills | Status: DC

## 2016-10-16 MED ORDER — LIDOCAINE HCL (PF) 1 % IJ SOLN
INTRAMUSCULAR | Status: AC
  Filled 2016-10-16: qty 30

## 2016-10-16 MED ORDER — HEPARIN SODIUM (PORCINE) 1000 UNIT/ML IJ SOLN
INTRAMUSCULAR | Status: AC
  Filled 2016-10-16: qty 1

## 2016-10-16 MED ORDER — LIDOCAINE HCL (PF) 1 % IJ SOLN
INTRAMUSCULAR | Status: DC | PRN
Start: 2016-10-16 — End: 2016-10-16
  Administered 2016-10-16: 2 mL

## 2016-10-16 MED ORDER — TICAGRELOR 90 MG PO TABS
ORAL_TABLET | ORAL | Status: DC | PRN
Start: 2016-10-16 — End: 2016-10-16
  Administered 2016-10-16: 180 mg via ORAL

## 2016-10-16 MED ORDER — OXYCODONE-ACETAMINOPHEN 5-325 MG PO TABS: 1.0000 | ORAL_TABLET | Freq: Three times a day (TID) | ORAL | 0 refills | Status: DC | PRN

## 2016-10-16 MED ORDER — IOPAMIDOL (ISOVUE-370) INJECTION 76%
INTRAVENOUS | Status: DC | PRN
  Administered 2016-10-16: 105 mL via INTRA_ARTERIAL

## 2016-10-16 MED ORDER — LABETALOL HCL 5 MG/ML IV SOLN: 10.0000 mg | INTRAVENOUS | Status: AC | PRN

## 2016-10-16 MED ORDER — ASPIRIN 81 MG PO CHEW: 81.0000 mg | CHEWABLE_TABLET | ORAL | Status: DC

## 2016-10-16 SURGICAL SUPPLY — 17 items
BALLN MOZEC 2.50X20 (BALLOONS) ×2
BALLN ~~LOC~~ MOZEC 3.0X28 (BALLOONS) ×2
BALLOON MOZEC 2.50X20 (BALLOONS) IMPLANT
BALLOON ~~LOC~~ MOZEC 3.0X28 (BALLOONS) IMPLANT
CATH OPTITORQUE TIG 4.0 5F (CATHETERS) ×1 IMPLANT
CATH VISTA GUIDE 6FR JR4 (CATHETERS) ×1 IMPLANT
DEVICE RAD COMP TR BAND LRG (VASCULAR PRODUCTS) ×1 IMPLANT
GLIDESHEATH SLEND A-KIT 6F 20G (SHEATH) ×1 IMPLANT
GUIDEWIRE INQWIRE 1.5J.035X260 (WIRE) IMPLANT
INQWIRE 1.5J .035X260CM (WIRE) ×2
KIT ENCORE 26 ADVANTAGE (KITS) ×1 IMPLANT
KIT HEART LEFT (KITS) ×2 IMPLANT
PACK CARDIAC CATHETERIZATION (CUSTOM PROCEDURE TRAY) ×2 IMPLANT
STENT RESOLUTE ONYX3.0X38 (Permanent Stent) ×1 IMPLANT
TRANSDUCER W/STOPCOCK (MISCELLANEOUS) ×2 IMPLANT
TUBING CIL FLEX 10 FLL-RA (TUBING) ×2 IMPLANT
WIRE RUNTHROUGH .014X180CM (WIRE) ×1 IMPLANT

## 2016-10-16 NOTE — Progress Notes (Addendum)
Per Dr Einar Gip give client brilinta prior to D/C and client may take plavix tonight; Dr Einar Gip aware client had brilinta 180mg  in cath lab

## 2016-10-16 NOTE — Interval H&P Note (Signed)
History and Physical Interval Note:  10/16/2016 7:46 AM  Tony Manning  has presented today for surgery, with the diagnosis of cp  The various methods of treatment have been discussed with the patient and family. After consideration of risks, benefits and other options for treatment, the patient has consented to  Procedure(s): Left Heart Cath and Coronary Angiography (N/A) and possible PCI as a surgical intervention .  The patient's history has been reviewed, patient examined, no change in status, stable for surgery.  I have reviewed the patient's chart and labs.  Questions were answered to the patient's satisfaction.   Ischemic Symptoms? CCS III (Marked limitation of ordinary activity) Anti-ischemic Medical Therapy? Maximal Medical Therapy (2 or more classes of medications) Non-invasive Test Results? No non-invasive testing performed Prior CABG? No Previous CABG   Patient Information:   1-2V CAD, no prox LAD  A (7)  Indication: 20; Score: 7   Patient Information:   1-2V-CAD with DS 50-60% With No FFR, No IVUS  I (3)  Indication: 21; Score: 3   Patient Information:   1-2V-CAD with DS 50-60% With FFR  A (7)  Indication: 22; Score: 7   Patient Information:   1-2V-CAD with DS 50-60% With FFR>0.8, IVUS not significant  I (2)  Indication: 23; Score: 2   Patient Information:   3V-CAD without LMCA With Abnormal LV systolic function  A (9)  Indication: 48; Score: 9   Patient Information:   LMCA-CAD  A (9)  Indication: 49; Score: 9   Patient Information:   2V-CAD with prox LAD PCI  A (7)  Indication: 62; Score: 7   Patient Information:   2V-CAD with prox LAD CABG  A (8)  Indication: 62; Score: 8   Patient Information:   3V-CAD without LMCA With Low CAD burden(i.e., 3 focal stenoses, low SYNTAX score) PCI  A (7)  Indication: 63; Score: 7   Patient Information:   3V-CAD without LMCA With Low CAD burden(i.e., 3 focal stenoses, low SYNTAX  score) CABG  A (9)  Indication: 63; Score: 9   Patient Information:   3V-CAD without LMCA E06c - Intermediate-high CAD burden (i.e., multiple diffuse lesions, presence of CTO, or high SYNTAX score) PCI  U (4)  Indication: 64; Score: 4   Patient Information:   3V-CAD without LMCA E06c - Intermediate-high CAD burden (i.e., multiple diffuse lesions, presence of CTO, or high SYNTAX score) CABG  A (9)  Indication: 64; Score: 9   Patient Information:   LMCA-CAD With Isolated LMCA stenosis  PCI  U (6)  Indication: 65; Score: 6   Patient Information:   LMCA-CAD With Isolated LMCA stenosis  CABG  A (9)  Indication: 65; Score: 9   Patient Information:   LMCA-CAD Additional CAD, low CAD burden (i.e., 1- to 2-vessel additional involvement, low SYNTAX score) PCI  U (5)  Indication: 66; Score: 5   Patient Information:   LMCA-CAD Additional CAD, low CAD burden (i.e., 1- to 2-vessel additional involvement, low SYNTAX score) CABG  A (9)  Indication: 66; Score: 9   Patient Information:   LMCA-CAD Additional CAD, intermediate-high CAD burden (i.e., 3-vessel involvement, presence of CTO, or high SYNTAX score) PCI  I (3)  Indication: 67; Score: 3   Patient Information:   LMCA-CAD Additional CAD, intermediate-high CAD burden (i.e., 3-vessel involvement, presence of CTO, or high SYNTAX score) CABG  A (9)  Indication: 67; Score: 9   Tony Manning

## 2016-10-16 NOTE — Progress Notes (Signed)
[id number] Education completed with pt and wife who voiced understanding. Discussed carb counting and heart healthy food choices. Stressed importance of plavix with stent. Reviewed NTG use, risk factors, ex ed and CRP 2. Referring to [facility] Phase 2. Graylon Good RN BSN 10/16/2016 2:24 PM

## 2016-10-16 NOTE — Discharge Instructions (Signed)
°  NO METFORMIN/GLUCOPHAGE FOR 2 DAYS   Radial Site Care Introduction Refer to this sheet in the next few weeks. These instructions provide you with information about caring for yourself after your procedure. Your health care provider may also give you more specific instructions. Your treatment has been planned according to current medical practices, but problems sometimes occur. Call your health care provider if you have any problems or questions after your procedure. What can I expect after the procedure? After your procedure, it is typical to have the following:  Bruising at the radial site that usually fades within 1-2 weeks.  Blood collecting in the tissue (hematoma) that may be painful to the touch. It should usually decrease in size and tenderness within 1-2 weeks. Follow these instructions at home:  Take medicines only as directed by your health care provider.  You may shower 24-48 hours after the procedure or as directed by your health care provider. Remove the bandage (dressing) and gently wash the site with plain soap and water. Pat the area dry with a clean towel. Do not rub the site, because this may cause bleeding.  Do not take baths, swim, or use a hot tub until your health care provider approves.  Check your insertion site every day for redness, swelling, or drainage.  Do not apply powder or lotion to the site.  Do not flex or bend the affected arm for 24 hours or as directed by your health care provider.  Do not push or pull heavy objects with the affected arm for 24 hours or as directed by your health care provider.  Do not lift over 10 lb (4.5 kg) for 5 days after your procedure or as directed by your health care provider.  Ask your health care provider when it is okay to:  Return to work or school.  Resume usual physical activities or sports.  Resume sexual activity.  Do not drive home if you are discharged the same day as the procedure. Have someone else  drive you.  You may drive 24 hours after the procedure unless otherwise instructed by your health care provider.  Do not operate machinery or power tools for 24 hours after the procedure.  If your procedure was done as an outpatient procedure, which means that you went home the same day as your procedure, a responsible adult should be with you for the first 24 hours after you arrive home.  Keep all follow-up visits as directed by your health care provider. This is important. Contact a health care provider if:  You have a fever.  You have chills.  You have increased bleeding from the radial site. Hold pressure on the site. Get help right away if:  You have unusual pain at the radial site.  You have redness, warmth, or swelling at the radial site.  You have drainage (other than a small amount of blood on the dressing) from the radial site.  The radial site is bleeding, and the bleeding does not stop after 30 minutes of holding steady pressure on the site.  Your arm or hand becomes pale, cool, tingly, or numb. This information is not intended to replace advice given to you by your health care provider. Make sure you discuss any questions you have with your health care provider. Document Released: 09/26/2010 Document Revised: 01/30/2016 Document Reviewed: 03/12/2014  2017 Elsevier

## 2016-10-16 NOTE — Progress Notes (Signed)
Pharmacy note: same day PCI discharge  56 yo male s/p cath and pharmacy consulted to coordinate with the outpatient pharmacy to deliver antiplatelet medications.  No prescriptions have been ordered and I have been unable to reach Dr. Nadyne Coombes. -Spoke with RN and patient to be discharge on Plavix and he has been taking this at home. No prescription is needed   Plan -No prescription needed for antiplatelet therapy  Hildred Laser, Pharm D 10/16/2016 2:01 PM

## 2016-10-16 NOTE — Progress Notes (Signed)
Dr Einar Gip notified of c/o chest discomfort and in to see client

## 2016-10-16 NOTE — Progress Notes (Signed)
Client and his wife watched "Cardiac Cath/PCI film

## 2016-10-19 ENCOUNTER — Encounter (HOSPITAL_COMMUNITY): Payer: Self-pay | Admitting: Cardiology

## 2016-10-19 NOTE — Progress Notes (Signed)
Called pt for follow up post same day PCI.  Pt states he is having chest pain and shortness of breath.  Called Dr. Milderd Meager office, talked to [place].  Office to call patient regarding condition.

## 2016-10-20 ENCOUNTER — Ambulatory Visit (INDEPENDENT_AMBULATORY_CARE_PROVIDER_SITE_OTHER): Payer: Medicare HMO | Admitting: Family Medicine

## 2016-10-20 ENCOUNTER — Encounter: Payer: Self-pay | Admitting: Family Medicine

## 2016-10-20 VITALS — BP 130/80 | HR 62 | Temp 98.3°F | Wt 215.8 lb

## 2016-10-20 DIAGNOSIS — R1032 Left lower quadrant pain: Secondary | ICD-10-CM

## 2016-10-20 MED ORDER — CIPROFLOXACIN HCL 500 MG PO TABS
500.0000 mg | ORAL_TABLET | Freq: Two times a day (BID) | ORAL | 0 refills | Status: DC
Start: 2016-10-20 — End: 2016-11-12

## 2016-10-20 MED ORDER — METRONIDAZOLE 500 MG PO TABS: 500.0000 mg | ORAL_TABLET | Freq: Three times a day (TID) | ORAL | 0 refills | Status: DC

## 2016-10-20 MED ORDER — LOSARTAN POTASSIUM 100 MG PO TABS: 50.0000 mg | ORAL_TABLET | Freq: Two times a day (BID) | ORAL | Status: DC

## 2016-10-20 MED ORDER — INSULIN DETEMIR 100 UNIT/ML FLEXPEN: 34.0000 [IU] | PEN_INJECTOR | Freq: Every morning | SUBCUTANEOUS | Status: DC

## 2016-10-20 NOTE — Progress Notes (Signed)
New issue.  Episodic LLQ pain over the years but clearly worse recently.  It will wax and wane.  Throbbing pain.  Then will have diarrhea after a pain flare.  Heating pad helps.  No blood in stool steen.  No vomiting.  Some nausea.  No fevers.  No dysuria.    H/o diverticulosis on descending and sigmoid colon prev noted.   Previous colonoscopy report discussed with patient.  He had stent placed Friday, per cards.  It was placed through the right radial artery. No femoral catheter placement  PMH and SH reviewed  ROS: Per HPI unless specifically indicated in ROS section   Meds, vitals, and allergies reviewed.   GEN: nad, alert and oriented HEENT: mucous membranes moist NECK: supple w/o LA CV: rrr. PULM: ctab, no inc wob ABD: soft, +bs, tender to palpation without rebound on left lower quadrant EXT: no edema SKIN: no acute rash but bruising noted on right wrist, as expected.

## 2016-10-20 NOTE — Patient Instructions (Signed)
Go to the lab on the way out.  We'll contact you with your lab report. Likely diverticulitis.   Start cipro and flagyl.  Clear liquids for now, then gradually advance your diet when feeling better.  Take care.  Glad to see you.

## 2016-10-20 NOTE — Progress Notes (Signed)
Pre visit review using our clinic review tool, if applicable. No additional management support is needed unless otherwise documented below in the visit note. 

## 2016-10-21 ENCOUNTER — Encounter: Payer: Self-pay | Admitting: Family Medicine

## 2016-10-21 DIAGNOSIS — R1032 Left lower quadrant pain: Secondary | ICD-10-CM | POA: Insufficient documentation

## 2016-10-21 LAB — CBC WITH DIFFERENTIAL/PLATELET
Basophils Absolute: 0.1 10*3/uL (ref 0.0–0.1)
Basophils Relative: 0.9 % (ref 0.0–3.0)
Eosinophils Absolute: 0.2 10*3/uL (ref 0.0–0.7)
Eosinophils Relative: 2.2 % (ref 0.0–5.0)
HCT: 47.6 % (ref 39.0–52.0)
Hemoglobin: 16 g/dL (ref 13.0–17.0)
Lymphocytes Relative: 17.8 % (ref 12.0–46.0)
Lymphs Abs: 2 10*3/uL (ref 0.7–4.0)
MCHC: 33.5 g/dL (ref 30.0–36.0)
MCV: 90.4 fl (ref 78.0–100.0)
Monocytes Absolute: 0.9 10*3/uL (ref 0.1–1.0)
Monocytes Relative: 7.8 % (ref 3.0–12.0)
Neutro Abs: 7.8 10*3/uL — ABNORMAL HIGH (ref 1.4–7.7)
Neutrophils Relative %: 71.3 % (ref 43.0–77.0)
Platelets: 238 10*3/uL (ref 150.0–400.0)
RBC: 5.27 Mil/uL (ref 4.22–5.81)
RDW: 13.7 % (ref 11.5–15.5)
WBC: 11 10*3/uL — ABNORMAL HIGH (ref 4.0–10.5)

## 2016-10-21 NOTE — Assessment & Plan Note (Signed)
Likely diverticulitis. Check CBC. We did not image this point for multiple reasons. #1 diagnosis is very likely and we do not need imaging to confirm. #2 he recently had catheterization and I do not want to expose him to die again unless necessary. #3 Treatment would be more important than diagnostic testing at this point.   He agrees. Start Cipro Flagyl. Update me as needed. Low fiber diet meantime. See after visit summary. Routine cautions given to patient. Okay for outpatient follow-up.

## 2016-10-23 ENCOUNTER — Ambulatory Visit (INDEPENDENT_AMBULATORY_CARE_PROVIDER_SITE_OTHER): Payer: Medicare HMO | Admitting: Specialist

## 2016-10-26 ENCOUNTER — Ambulatory Visit (INDEPENDENT_AMBULATORY_CARE_PROVIDER_SITE_OTHER): Payer: Medicare HMO | Admitting: Family Medicine

## 2016-10-26 ENCOUNTER — Ambulatory Visit (INDEPENDENT_AMBULATORY_CARE_PROVIDER_SITE_OTHER)
Admission: RE | Admit: 2016-10-26 | Discharge: 2016-10-26 | Disposition: A | Discharge status: Home / Self Care | Payer: Medicare HMO | Source: Ambulatory Visit | Attending: Family Medicine | Admitting: Family Medicine

## 2016-10-26 ENCOUNTER — Encounter: Payer: Self-pay | Admitting: Family Medicine

## 2016-10-26 VITALS — BP 110/74 | HR 76 | Temp 97.8°F | Wt 211.5 lb

## 2016-10-26 DIAGNOSIS — Z87442 Personal history of urinary calculi: Secondary | ICD-10-CM

## 2016-10-26 DIAGNOSIS — R1032 Left lower quadrant pain: Secondary | ICD-10-CM

## 2016-10-26 DIAGNOSIS — R300 Dysuria: Secondary | ICD-10-CM

## 2016-10-26 DIAGNOSIS — N200 Calculus of kidney: Secondary | ICD-10-CM | POA: Diagnosis not present

## 2016-10-26 LAB — CBC WITH DIFFERENTIAL/PLATELET
Basophils Absolute: 0 10*3/uL (ref 0.0–0.1)
Basophils Relative: 0.4 % (ref 0.0–3.0)
Eosinophils Absolute: 0.3 10*3/uL (ref 0.0–0.7)
Eosinophils Relative: 3.1 % (ref 0.0–5.0)
HCT: 47.7 % (ref 39.0–52.0)
Hemoglobin: 16.1 g/dL (ref 13.0–17.0)
Lymphocytes Relative: 18.1 % (ref 12.0–46.0)
Lymphs Abs: 1.6 10*3/uL (ref 0.7–4.0)
MCHC: 33.7 g/dL (ref 30.0–36.0)
MCV: 89.9 fl (ref 78.0–100.0)
Monocytes Absolute: 0.7 10*3/uL (ref 0.1–1.0)
Monocytes Relative: 8.3 % (ref 3.0–12.0)
Neutro Abs: 6.1 10*3/uL (ref 1.4–7.7)
Neutrophils Relative %: 70.1 % (ref 43.0–77.0)
Platelets: 219 10*3/uL (ref 150.0–400.0)
RBC: 5.3 Mil/uL (ref 4.22–5.81)
RDW: 13.5 % (ref 11.5–15.5)
WBC: 8.7 10*3/uL (ref 4.0–10.5)

## 2016-10-26 LAB — BASIC METABOLIC PANEL
BUN: 20 mg/dL (ref 6–23)
CO2: 23 mEq/L (ref 19–32)
Calcium: 9.5 mg/dL (ref 8.4–10.5)
Chloride: 104 mEq/L (ref 96–112)
Creatinine, Ser: 1.08 mg/dL (ref 0.40–1.50)
GFR: 75 mL/min (ref >60.00)
Glucose, Bld: 176 mg/dL — ABNORMAL HIGH (ref 70–99)
Potassium: 3.9 mEq/L (ref 3.5–5.1)
Sodium: 136 mEq/L (ref 135–145)

## 2016-10-26 LAB — POC URINALSYSI DIPSTICK (AUTOMATED)
Bilirubin, UA: NEGATIVE
Glucose, UA: NEGATIVE
Ketones, UA: POSITIVE
Nitrite, UA: NEGATIVE
Spec Grav, UA: >=1.03
Urobilinogen, UA: 0.2
pH, UA: 5.5

## 2016-10-26 MED ORDER — TAMSULOSIN HCL 0.4 MG PO CAPS: 0.4000 mg | ORAL_CAPSULE | Freq: Every day | ORAL | 3 refills | Status: DC

## 2016-10-26 MED ORDER — OXYCODONE HCL 5 MG PO TABS: 2.5000 mg | ORAL_TABLET | Freq: Four times a day (QID) | ORAL | Status: DC | PRN

## 2016-10-26 NOTE — Progress Notes (Signed)
Pre visit review using our clinic review tool, if applicable. No additional management support is needed unless otherwise documented below in the visit note. 

## 2016-10-26 NOTE — Patient Instructions (Signed)
Go to the lab on the way out.  We'll contact you with your lab and xray report.  Start flomax. Continue the antibiotics.   Use the oxycodone if needed.  Update me tomorrow.

## 2016-10-26 NOTE — Progress Notes (Signed)
OV on 10/20/16.  Lower abd pain then, some better today.  Still with some nausea.  Currently on cipro and flagyl, per prev OV rx.   He was getting some better, feeling some better overall and tried to advance his diet.  He then felt worse and had diarrhea.  Still on bland/minimal diet in the meantime.  He has persistent diarrhea that is black.  On aspirin and plavix.   He has some pressure and burning with urination.  He has slow stream recently, slow start to urine stream.  Abnormal u/a noted.   Diarrhea usually 5-6 times per day.    No vomiting, some nausea.  No fevers.    H/o many renal stones.  He has had some L flank pain in the interval since the last OV.  That has migrated to the groin on the L side.    The pain from the last OV in the LLQ is better but is more bothered by urinary sx now.    He has oxycodone to [place] if needed.    PMH and SH reviewed  ROS: Per HPI unless specifically indicated in ROS section   Meds, vitals, and allergies reviewed.   GEN: nad, alert and oriented HEENT: mucous membranes moist NECK: supple w/o LA CV: rrr.  no murmur PULM: ctab, no inc wob ABD: soft, +bs, left lower quadrant is less tender than before. No rebound. He is tender on the left flank and in the lower abdomen more generally. EXT: no edema SKIN: no acute rash

## 2016-10-27 ENCOUNTER — Telehealth: Payer: Self-pay | Admitting: Family Medicine

## 2016-10-27 LAB — URINE CULTURE: Organism ID, Bacteria: NO GROWTH

## 2016-10-27 NOTE — Assessment & Plan Note (Signed)
Likely with improving diverticulitis. He does have some black stools. If these do not resolve it will need further workup. At this point he likely does have a kidney stone is trying to pass so we should address that first. See notes on CBC, to make sure he is not anemic

## 2016-10-27 NOTE — Assessment & Plan Note (Signed)
Calcific density noted over the left lower pelvis could represent a phlebolith or a small stone in the left ureterovesical junction or bladder. I think this is causing his sx.  I think he has improving diverticulitis in the setting of a new/moving renal stone. Already on Cipro and Flagyl. Would check urine culture anyway. See notes on labs. See notes on imaging. Start Flomax. Has oxycodone use. Drink plenty of fluids. He will update me tomorrow. At this point still okay for outpatient follow-up. He agrees.

## 2016-10-27 NOTE — Telephone Encounter (Signed)
Patient returned Regina's call. °

## 2016-10-27 NOTE — Telephone Encounter (Signed)
Returned patient's call and lab results given to patient by telephone.

## 2016-10-28 ENCOUNTER — Telehealth (INDEPENDENT_AMBULATORY_CARE_PROVIDER_SITE_OTHER): Payer: Self-pay | Admitting: *Deleted

## 2016-10-28 ENCOUNTER — Telehealth: Payer: Self-pay

## 2016-10-28 DIAGNOSIS — E785 Hyperlipidemia, unspecified: Secondary | ICD-10-CM | POA: Diagnosis not present

## 2016-10-28 DIAGNOSIS — R0602 Shortness of breath: Secondary | ICD-10-CM | POA: Diagnosis not present

## 2016-10-28 DIAGNOSIS — I10 Essential (primary) hypertension: Secondary | ICD-10-CM | POA: Diagnosis not present

## 2016-10-28 DIAGNOSIS — I25119 Atherosclerotic heart disease of native coronary artery with unspecified angina pectoris: Secondary | ICD-10-CM | POA: Diagnosis not present

## 2016-10-28 MED ORDER — CYCLOBENZAPRINE HCL 10 MG PO TABS: 10.0000 mg | ORAL_TABLET | Freq: Three times a day (TID) | ORAL | 0 refills | Status: DC | PRN

## 2016-10-28 NOTE — Telephone Encounter (Signed)
Patient called in this morning in regards to his upcoming MRI he will not be able to have it done at this time due to being on Plavix. Dr. Louanne Skye will need to order a new MRI for his right shoulder. His CB # [phone]. Thank you

## 2016-10-28 NOTE — Telephone Encounter (Signed)
Pt notified, and given urine cx results.

## 2016-10-28 NOTE — Telephone Encounter (Signed)
Can you please help with this one? 

## 2016-10-28 NOTE — Telephone Encounter (Signed)
Sent.  Thanks.  Update me if not better.  Sedation caution.  See ucx results.  Thanks.

## 2016-10-28 NOTE — Telephone Encounter (Signed)
Pt left v/m pt having sciatic nerve pain and pt request muscle relaxant. Pt has been taking Tylenol with no relief and does not want to take the oxycodone apap for this. [facility].pt request cb.

## 2016-10-30 DIAGNOSIS — I2510 Atherosclerotic heart disease of native coronary artery without angina pectoris: Secondary | ICD-10-CM | POA: Diagnosis not present

## 2016-10-30 DIAGNOSIS — G4710 Hypersomnia, unspecified: Secondary | ICD-10-CM | POA: Diagnosis not present

## 2016-10-30 DIAGNOSIS — G4733 Obstructive sleep apnea (adult) (pediatric): Secondary | ICD-10-CM | POA: Diagnosis not present

## 2016-10-30 NOTE — Telephone Encounter (Signed)
I called and spoke with pt and he stated that now he can not have the CT done with contrast because he had a heart stent put in 2 weeks ago and knows that his cardiologist will not take him off [place] as recommended and that he would just call to get the MRI scheduled, I advised the pt to contact his cardiologist he states that he will not because he knows he is not going to approve it, I asked him to call so that it would be documented and Dr. Louanne Skye will be able to see in his chart that he called and what the doctor had said and Dr. Louanne Skye would know what to do from that point, pt argued with me and told me to [facility] it since he cant do anything or have surgery until end of May beginning of June. Pt will call back to let us know he is ready for MRI/CT.

## 2016-11-01 ENCOUNTER — Encounter: Payer: Self-pay | Admitting: Family Medicine

## 2016-11-03 ENCOUNTER — Ambulatory Visit (INDEPENDENT_AMBULATORY_CARE_PROVIDER_SITE_OTHER): Payer: Medicare HMO | Admitting: Orthopaedic Surgery

## 2016-11-11 ENCOUNTER — Ambulatory Visit: Payer: Medicare HMO

## 2016-11-11 ENCOUNTER — Ambulatory Visit (INDEPENDENT_AMBULATORY_CARE_PROVIDER_SITE_OTHER): Payer: Medicare HMO | Admitting: Podiatry

## 2016-11-11 ENCOUNTER — Encounter: Payer: Self-pay | Admitting: Podiatry

## 2016-11-11 DIAGNOSIS — L02619 Cutaneous abscess of unspecified foot: Secondary | ICD-10-CM | POA: Diagnosis not present

## 2016-11-11 DIAGNOSIS — M79671 Pain in right foot: Secondary | ICD-10-CM | POA: Diagnosis not present

## 2016-11-11 DIAGNOSIS — E1149 Type 2 diabetes mellitus with other diabetic neurological complication: Secondary | ICD-10-CM

## 2016-11-11 DIAGNOSIS — L03119 Cellulitis of unspecified part of limb: Secondary | ICD-10-CM | POA: Diagnosis not present

## 2016-11-11 DIAGNOSIS — M2041 Other hammer toe(s) (acquired), right foot: Secondary | ICD-10-CM

## 2016-11-11 DIAGNOSIS — M2042 Other hammer toe(s) (acquired), left foot: Secondary | ICD-10-CM

## 2016-11-11 DIAGNOSIS — L84 Corns and callosities: Secondary | ICD-10-CM

## 2016-11-11 MED ORDER — AMOXICILLIN-POT CLAVULANATE 875-125 MG PO TABS: 1.0000 | ORAL_TABLET | Freq: Two times a day (BID) | ORAL | 0 refills | Status: DC

## 2016-11-11 NOTE — Progress Notes (Signed)
He presents today to pick up his diabetic shoes. He states that he noticed last night that he has severe pain in his right heel. He denies fever chills nausea or muscle aches and pains. He does state that about a month ago he had a stent placed in his heart.  Objective: Vital signs are stable he is alert and oriented 3 pulses are palpable. He does have cellulitis from the plantar aspect of the left heel where it appears that he's had small skin abrasions or tears all muscle site he's been taking the skin away or tearing the skin away cellulitis is extending up to the level of the ankle as it streaks. Currently no signs of infection systemically.  Assessment diabetes mellitus with diabetic peripheral neuropathy hammertoe deformity and skin breakdown. Cellulitis.  Plan: Diabetic shoes were dispensed today he is given both oral and written home-going instructions for care and use of his shoes. He was also provided with a prescription for Augmentin 875 mg 1 by mouth twice a day he is to get to and him before bedtime. He is to watch for signs and symptoms of worsening infection such as increase in blood sugar fever and chills shortness of breath or inability to think clearly. He will go straight to the emergency department should any this occur. Otherwise we'll follow-up with him in 1 week

## 2016-11-12 ENCOUNTER — Encounter: Payer: Self-pay | Admitting: Family Medicine

## 2016-11-12 ENCOUNTER — Ambulatory Visit (INDEPENDENT_AMBULATORY_CARE_PROVIDER_SITE_OTHER): Payer: Medicare HMO | Admitting: Family Medicine

## 2016-11-12 DIAGNOSIS — M542 Cervicalgia: Secondary | ICD-10-CM | POA: Diagnosis not present

## 2016-11-12 DIAGNOSIS — M79673 Pain in unspecified foot: Secondary | ICD-10-CM | POA: Diagnosis not present

## 2016-11-12 MED ORDER — DULOXETINE HCL 30 MG PO CPEP: 30.0000 mg | ORAL_CAPSULE | Freq: Every day | ORAL | 3 refills | Status: DC

## 2016-11-12 NOTE — Progress Notes (Signed)
Started on augmentin for foot cellulitis, R foot, heel.  Clearly better today.  Less red.  Can walk better today.  No fevers.  Seen at podiatry clinic yesterday.  Sugar was 150 this AM.   He was asking about pain management.  He wanted to get off oxycodone and flexeril.  Still with neck and shoulder pain.  Neck and R shoulder/arm pain.  Cramping, pain with ROM.  Burning pain.  He is trying to get set up for surgery but recent cardiac intervention affects his scheduling.  D/w pt.  Heat helps some, locally.  "Sometimes nothing seems to touch it no matter what I do."  Activity is clearly limited by pain.  Med options d/w pt.   Meds, vitals, and allergies reviewed.   ROS: Per HPI unless specifically indicated in ROS section   nad ncat rrr ctab abd soft R heel not red, minimally ttp and this is clearly improved per patient report.   His right arm is grossly slightly weaker than the left, this is his baseline, this finding is known to neurosurgery per patient report.

## 2016-11-12 NOTE — Progress Notes (Signed)
Pre visit review using our clinic review tool, if applicable. No additional management support is needed unless otherwise documented below in the visit note. 

## 2016-11-12 NOTE — Patient Instructions (Addendum)
Try adding on cymbalta and see if that helps.   You the oxycodone only if needed.   Stop flexeril in the meantime.  Take care.  Glad to see you.

## 2016-11-13 NOTE — Assessment & Plan Note (Signed)
Clearly improved today. Less red. Cellulitis appears to be improving. Continue current antibiotics. He agrees. Update me as needed. He

## 2016-11-13 NOTE — Assessment & Plan Note (Signed)
Discussed options. We talked about options in no particular order. He will to get off opiates and muscle relaxers. NSAIDs were not of much help previously and I would like to avoid high-dose NSAIDs to try to preserve his kidney function. Prednisone is not a good option because of his history of diabetes. He has tried gabapentin without much effect previously. He cannot tolerate tramadol. Tylenol has been of minimal help he tried amitriptyline previously but made him sleepy. 2 remaining options would be Cymbalta or Lyrica. Discussed with patient about the options with both. It may be reasonable to try Cymbalta first, that way if affected we can avoid the potential side effect of swelling associated with there. Routine cautions discussed with patient. My hope is that he can get on Cymbalta, have some baseline relief of pain, and then use less or no opiates. He agrees. At this point still okay for outpatient follow-up. >25 minutes spent in face to face time with patient, >50% spent in counselling or coordination of care.

## 2016-11-18 ENCOUNTER — Ambulatory Visit: Payer: Medicare HMO | Admitting: Podiatry

## 2016-11-20 ENCOUNTER — Ambulatory Visit: Payer: Medicare HMO | Admitting: Family Medicine

## 2016-11-25 NOTE — Progress Notes (Signed)
Dispensed diabetic shoes and 3 pairs of insoles. Instructions were reviewed and a copy was given to the patient. Patient will reappointment for regularly scheduled diabetic foot care visits or if they experience any trouble with the diabetic shoes.  

## 2016-12-01 ENCOUNTER — Ambulatory Visit: Payer: Medicare HMO | Admitting: Family Medicine

## 2016-12-02 ENCOUNTER — Encounter: Payer: Self-pay | Admitting: Family Medicine

## 2016-12-02 ENCOUNTER — Ambulatory Visit (INDEPENDENT_AMBULATORY_CARE_PROVIDER_SITE_OTHER): Payer: Medicare HMO | Admitting: Family Medicine

## 2016-12-02 DIAGNOSIS — M542 Cervicalgia: Secondary | ICD-10-CM

## 2016-12-02 MED ORDER — DULOXETINE HCL 60 MG PO CPEP: 60.0000 mg | ORAL_CAPSULE | Freq: Every day | ORAL | 5 refills | Status: DC

## 2016-12-02 MED ORDER — OXYCODONE-ACETAMINOPHEN 5-325 MG PO TABS: 1.0000 | ORAL_TABLET | Freq: Three times a day (TID) | ORAL | 0 refills | Status: DC | PRN

## 2016-12-02 NOTE — Patient Instructions (Signed)
Increase the cymbalta to 60mg  a day.  Use the oxycodone sparingly if needed.  Take care.  Glad to see you.

## 2016-12-02 NOTE — Progress Notes (Signed)
Pre visit review using our clinic review tool, if applicable. No additional management support is needed unless otherwise documented below in the visit note. 

## 2016-12-02 NOTE — Progress Notes (Signed)
Has been on cymbalta, taking 30mg .  It was helping some with the pain but the effect levelled off, then tapered off effect.  Didn't affect his mood.  No ADE on cymbalta.  He isn't able to sleep well in the meantime due to pain.  He tapered off oxycodone prev.   He has tried heat packs in the meantime.   Still with sig R arm pain and intermittent numbness/tingling.    He had called Dr. Otho Ket office but he is out of the office for a few weeks.    His surgery is still put off due to cardiac cautions.    Meds, vitals, and allergies reviewed.   ROS: Per HPI unless specifically indicated in ROS section   nad ncat but uncomfortable Pincher grip dec on the R hand.  Pain on neck ext.  both of these are at baseline rrr ctab Sensation intact on the BUE today on exam.

## 2016-12-03 NOTE — Assessment & Plan Note (Signed)
Discussed with patient about options. Reasonable to increase Cymbalta to 60 mg, gave patient prescription for oxycodone to use as needed in the meantime with routine opiate cautions. He agrees with plan. Update me as needed. I will await input from surgery.

## 2016-12-21 ENCOUNTER — Ambulatory Visit: Payer: Medicare HMO | Admitting: Family Medicine

## 2016-12-31 ENCOUNTER — Other Ambulatory Visit: Payer: Self-pay | Admitting: *Deleted

## 2016-12-31 NOTE — Telephone Encounter (Signed)
Last Rx and OV 12/02/2016. Pt aware Dr [name] out of office

## 2017-01-01 MED ORDER — OXYCODONE-ACETAMINOPHEN 5-325 MG PO TABS: 1.0000 | ORAL_TABLET | Freq: Three times a day (TID) | ORAL | 0 refills | Status: DC | PRN

## 2017-01-01 NOTE — Telephone Encounter (Signed)
Patient advised.  Rx left at front desk for pick up. 

## 2017-01-01 NOTE — Telephone Encounter (Signed)
Printed.  Thanks.  

## 2017-01-04 DIAGNOSIS — I25119 Atherosclerotic heart disease of native coronary artery with unspecified angina pectoris: Secondary | ICD-10-CM | POA: Diagnosis not present

## 2017-01-04 DIAGNOSIS — G4733 Obstructive sleep apnea (adult) (pediatric): Secondary | ICD-10-CM | POA: Diagnosis not present

## 2017-01-04 DIAGNOSIS — I10 Essential (primary) hypertension: Secondary | ICD-10-CM | POA: Diagnosis not present

## 2017-01-04 DIAGNOSIS — E785 Hyperlipidemia, unspecified: Secondary | ICD-10-CM | POA: Diagnosis not present

## 2017-01-13 ENCOUNTER — Encounter: Payer: Self-pay | Admitting: Internal Medicine

## 2017-01-13 ENCOUNTER — Ambulatory Visit (INDEPENDENT_AMBULATORY_CARE_PROVIDER_SITE_OTHER): Payer: Medicare HMO | Admitting: Internal Medicine

## 2017-01-13 VITALS — BP 122/78 | HR 91 | Temp 97.9°F | Wt 214.0 lb

## 2017-01-13 DIAGNOSIS — K5792 Diverticulitis of intestine, part unspecified, without perforation or abscess without bleeding: Secondary | ICD-10-CM | POA: Diagnosis not present

## 2017-01-13 DIAGNOSIS — R141 Gas pain: Secondary | ICD-10-CM | POA: Diagnosis not present

## 2017-01-13 DIAGNOSIS — R1032 Left lower quadrant pain: Secondary | ICD-10-CM

## 2017-01-13 DIAGNOSIS — R140 Abdominal distension (gaseous): Secondary | ICD-10-CM

## 2017-01-13 DIAGNOSIS — R310 Gross hematuria: Secondary | ICD-10-CM

## 2017-01-13 DIAGNOSIS — R110 Nausea: Secondary | ICD-10-CM

## 2017-01-13 LAB — POC URINALSYSI DIPSTICK (AUTOMATED)
Bilirubin, UA: NEGATIVE
Ketones, UA: NEGATIVE
Nitrite, UA: NEGATIVE
Spec Grav, UA: >=1.03 — AB (ref 1.010–1.025)
Urobilinogen, UA: 0.2 E.U./dL
pH, UA: 6 (ref 5.0–8.0)

## 2017-01-13 MED ORDER — CIPROFLOXACIN HCL 500 MG PO TABS: 500.0000 mg | ORAL_TABLET | Freq: Two times a day (BID) | ORAL | 0 refills | Status: DC

## 2017-01-13 MED ORDER — METRONIDAZOLE 500 MG PO TABS: 500.0000 mg | ORAL_TABLET | Freq: Three times a day (TID) | ORAL | 0 refills | Status: DC

## 2017-01-13 NOTE — Progress Notes (Signed)
Subjective:    Patient ID: Tony Manning, male    DOB: 15-Apr-1961, 56 y.o.   MRN: [id number]  HPI  Pt presents to the clinic today with c/o LLQ abdominal pain. He reports this started this morning. He describes the pain as crampy. The pain radiates into his back. He reports associated gas, bloating, diarrhea but denies nausea, vomiting, constipation or blood in his stool. He reports he also started having blood in his urine this morning, but denies urgency, frequency or nausea. He denies fever, chills or body aches. He does have a history of kidney stones. He had a colonoscopy in 2014 which did show diverticulosis. He reports this does not feel like a kidney stone but more like a flare of diverticulitis. He has not tried anything OTC.  Review of Systems  Past Medical History:  Diagnosis Date  . Anginal pain ([place])   . Basal cell carcinoma of face   . Chest pain, unspecified   . Coronary atherosclerosis of native coronary artery    s/p stent x4  . Depression   . Diabetes mellitus without mention of complication   . Diverticulosis   . Erectile dysfunction   . Essential hypertension, benign   . Heart murmur   . History of nephrolithiasis   . Migraine   . Osteoarthritis   . Postsurgical percutaneous transluminal coronary angioplasty status    s/p stent x4  . Sarcoidosis ([place])   . Unspecified sleep apnea     Current Outpatient Prescriptions  Medication Sig Dispense Refill  . aspirin 81 MG tablet Take 81 mg by mouth daily.     [name] Kitchen atorvastatin (LIPITOR) 40 MG tablet Take 1 tablet (40 mg total) by mouth daily. 90 tablet 3  . carvedilol (COREG) 3.125 MG tablet Take 1 tablet (3.125 mg total) by mouth 2 (two) times daily with a meal. 60 tablet 12  . clopidogrel (PLAVIX) 75 MG tablet Take 75 mg by mouth at bedtime.     . DULoxetine (CYMBALTA) 60 MG capsule Take 1 capsule (60 mg total) by mouth daily. 30 capsule 5  . glimepiride (AMARYL) 4 MG tablet Take 1 tablet (4 mg total) by mouth  2 (two) times daily. 180 tablet 3  . glucose blood (ONE TOUCH ULTRA TEST) test strip Check sugar up to 6 times a day.  DM2, insulin treated, uncontrolled. 200 each 12  . Insulin Detemir (LEVEMIR) 100 UNIT/ML Pen Inject 34 Units into the skin every morning. If AM sugar >120, add a unit. If AM sugar <80, decrease a unit.  If 80-120, then no change in dose    . Insulin Pen Needle 31G X 4 MM MISC 1 Device by Does not apply route daily. Use daily with insulin needle 100 each 3  . losartan (COZAAR) 100 MG tablet Take 0.5 tablets (50 mg total) by mouth 2 (two) times daily.    . metFORMIN (GLUCOPHAGE) 1000 MG tablet TAKE 1 TABLET BY MOUTH TWICE A DAY WITH A MEAL 180 tablet 1  . nitroGLYCERIN (NITROSTAT) 0.4 MG SL tablet Place 1 tablet (0.4 mg total) under the tongue every 5 (five) minutes as needed for chest pain. 25 tablet 3  . oxyCODONE-acetaminophen (ROXICET) 5-325 MG tablet Take 1 tablet by mouth every 8 (eight) hours as needed for severe pain. 30 tablet 0  . tamsulosin (FLOMAX) 0.4 MG CAPS capsule Take 1 capsule (0.4 mg total) by mouth daily. 30 capsule 3   No current facility-administered medications for this visit.  Allergies  Allergen Reactions  . Ambien [Zolpidem] Other (See Comments)    Parasomnias, sleep walking  . Lisinopril Cough  . Tramadol     diarrhea    Family History  Problem Relation Age of Onset  . Arthritis Mother   . Depression Mother   . Stroke Mother   . Arthritis Father   . Diabetes Father   . Coronary artery disease Father   . Colon cancer Neg Hx   . Prostate cancer Neg Hx   . Esophageal cancer Neg Hx   . Rectal cancer Neg Hx   . Stomach cancer Neg Hx     Social History   Social History  . Marital status: Married    Spouse name: N/A  . Number of children: 2  . Years of education: N/A   Occupational History  . disabled Unemployed   Social History Main Topics  . Smoking status: Never Smoker  . Smokeless tobacco: Former Systems developer    Types: [place]  date: 02/06/2016     Comment: trying to quit  . Alcohol use No  . Drug use: No  . Sexual activity: Yes    Birth control/ protection: Condom   Other Topics Concern  . Not on file   Social History Narrative   Married, 1987   2 kids   On disability     Constitutional: Denies fever, malaise, fatigue, headache or abrupt weight changes.  Gastrointestinal: Pt reports abdominal pain, bloating and diarrhea. Denies constipation, or blood in the stool.  GU: Pt reports blood in his urine. Denies urgency, frequency, pain with urination, burning sensation, odor or discharge.  No other specific complaints in a complete review of systems (except as listed in HPI above).     Objective:   Physical Exam   BP 122/78   Pulse 91   Temp 97.9 F (36.6 C) (Oral)   Wt 214 lb (97.1 kg)   SpO2 98%   BMI 30.71 kg/m  Wt Readings from Last 3 Encounters:  01/13/17 214 lb (97.1 kg)  12/02/16 218 lb 4 oz (99 kg)  11/12/16 217 lb 4 oz (98.5 kg)    General: Appears his stated age, obese in NAD. Abdomen: Soft and tender in the LLQ. Normal bowel sounds. No distention or masses noted. No CVA tenderness noted   BMET    Component Value Date/Time   NA 136 10/26/2016 0917   K 3.9 10/26/2016 0917   CL 104 10/26/2016 0917   CO2 23 10/26/2016 0917   GLUCOSE 176 (H) 10/26/2016 0917   BUN 20 10/26/2016 0917   CREATININE 1.08 10/26/2016 0917   CALCIUM 9.5 10/26/2016 0917   GFRNONAA >90 06/15/2014 0354   GFRAA >90 06/15/2014 0354    Lipid Panel     Component Value Date/Time   CHOL 120 05/12/2016 1006   TRIG 101.0 05/12/2016 1006   HDL 41.70 05/12/2016 1006   CHOLHDL 3 05/12/2016 1006   VLDL 20.2 05/12/2016 1006   LDLCALC 58 05/12/2016 1006    CBC    Component Value Date/Time   WBC 8.7 10/26/2016 0917   RBC 5.30 10/26/2016 0917   HGB 16.1 10/26/2016 0917   HCT 47.7 10/26/2016 0917   PLT 219.0 10/26/2016 0917   MCV 89.9 10/26/2016 0917   MCH 29.9 10/16/2016 0541   MCHC 33.7 10/26/2016  0917   RDW 13.5 10/26/2016 0917   LYMPHSABS 1.6 10/26/2016 0917   MONOABS 0.7 10/26/2016 0917   EOSABS 0.3 10/26/2016  0917   BASOSABS 0.0 10/26/2016 0917    Hgb A1C Lab Results  Component Value Date   HGBA1C 7.9 (H) 08/10/2016           Assessment & Plan:   LLQ Abdominal Pain, Nausea, Gas and Bloating:  Concern for diverticulitis eRx for Cipro 5000 mg BID x 10 days eRx for Flagyl 500 mg TID x 10 days Clear liquid diet for 48 hours then advance as tolerated  Hematuria:  Urinalysis: 1+ leuks, 3+ blood Will send urine culture ? Concurrent kidney stones Push fluids  Follow up with PCP regarding hematuria if this does not resolve in 1 week or if you develop sharp, stabbing abdominal pain and difficulty urinating. Webb Silversmith, NP

## 2017-01-13 NOTE — Addendum Note (Signed)
Addended by: Lurlean Nanny on: 01/13/2017 01:39 PM   Modules accepted: Orders

## 2017-01-13 NOTE — Patient Instructions (Signed)

## 2017-01-14 ENCOUNTER — Encounter (INDEPENDENT_AMBULATORY_CARE_PROVIDER_SITE_OTHER): Payer: Self-pay | Admitting: Specialist

## 2017-01-14 ENCOUNTER — Ambulatory Visit (INDEPENDENT_AMBULATORY_CARE_PROVIDER_SITE_OTHER): Payer: Medicare HMO | Admitting: Specialist

## 2017-01-14 ENCOUNTER — Ambulatory Visit (INDEPENDENT_AMBULATORY_CARE_PROVIDER_SITE_OTHER): Payer: Medicare HMO

## 2017-01-14 VITALS — BP 100/58 | HR 65 | Ht 70.0 in | Wt 215.0 lb

## 2017-01-14 DIAGNOSIS — R29898 Other symptoms and signs involving the musculoskeletal system: Secondary | ICD-10-CM

## 2017-01-14 DIAGNOSIS — G8929 Other chronic pain: Secondary | ICD-10-CM

## 2017-01-14 DIAGNOSIS — M5020 Other cervical disc displacement, unspecified cervical region: Secondary | ICD-10-CM

## 2017-01-14 DIAGNOSIS — M25511 Pain in right shoulder: Secondary | ICD-10-CM

## 2017-01-14 LAB — URINE CULTURE: Organism ID, Bacteria: NO GROWTH

## 2017-01-14 NOTE — Patient Instructions (Signed)
Avoid overhead lifting and overhead use of the arms. Do not lift greater than 5 lbs. Adjust head rest in vehicle to prevent hyperextension if rear ended.   

## 2017-01-14 NOTE — Progress Notes (Signed)
Office Visit Note   Patient: Tony Manning           Date of Birth: 1961-03-28           MRN: [id number] Visit Date: 01/14/2017              Requested by: Tonia Ghent, MD [address] [place], [place] [address] PCP: Tonia Ghent, MD   Assessment & Plan: Visit Diagnoses:  1. Chronic right shoulder pain   2. Weakness of shoulder   3. HNP (herniated nucleus pulposus), cervical     Plan:Avoid overhead lifting and overhead use of the arms. Do not lift greater than 5 lbs. Adjust head rest in vehicle to prevent hyperextension if rear ended.    Follow-Up Instructions: Return in about 3 weeks (around 02/04/2017).   Orders:  Orders Placed This Encounter  Procedures  . XR Shoulder Right   No orders of the defined types were placed in this encounter.     Procedures: No procedures performed   Clinical Data: No additional findings.   Subjective: Chief Complaint  Patient presents with  . Neck - Follow-up  . Right Shoulder - Follow-up    56 year old male with history of CAD, previously evaluated with cervical disc disease but has had stents placed in 2018, has a total of 5 stents. He has been advised to discontinue NSAIDs. Neck pain is better, experiencing some recurrence of pain into right shoulder, right lateral arm and elbow into the dorsal right hand. No gait disturbance, no Bowel or bladder difficulties, did pass a kidney stone recently, concern of history of diveticulitis. Diabeted, on levemir insulin, glypizide and metformin.  He is on SSDI for diabetes, peripheral neuropathy, lumbar DDD and cervical disease. Notices clumbsiness right hand greater than left. On scale of pain 1-10 it is presently a 1. Has oxycodone And tyenol. Dr. [name] prescribes along with cymbalta.     Review of Systems  Constitutional: Negative.   HENT: Positive for rhinorrhea and sneezing.   Eyes: Negative.   Respiratory: Negative.   Cardiovascular: Positive for chest  pain.  Gastrointestinal: Negative.   Endocrine: Negative.   Genitourinary: Negative.   Musculoskeletal: Negative.   Skin: Negative.   Allergic/Immunologic: Negative.   Neurological: Negative.   Hematological: Negative.   Psychiatric/Behavioral: Negative.      Objective: Vital Signs: BP (!) 100/58 (BP Location: Left Arm, Patient Position: Sitting)   Pulse 65   Ht 5\' 10"  (1.778 m)   Wt 215 lb (97.5 kg)   BMI 30.85 kg/m   Physical Exam  Constitutional: He is oriented to person, place, and time. He appears well-developed and well-nourished.  HENT:  Head: Normocephalic and atraumatic.  Eyes: EOM are normal. Pupils are equal, round, and reactive to light.  Neck: Normal range of motion. Neck supple.  Pulmonary/Chest: Effort normal and breath sounds normal.  Abdominal: Soft. Bowel sounds are normal.  Neurological: He is alert and oriented to person, place, and time.  Skin: Skin is warm and dry.  Psychiatric: He has a normal mood and affect. His behavior is normal. Judgment and thought content normal.    Back Exam   Tenderness  The patient is experiencing tenderness in the cervical.  Range of Motion  Extension: abnormal  Flexion: abnormal  Lateral Bend Right: normal  Lateral Bend Left: normal  Rotation Right: normal  Rotation Left: normal   Tests  Straight leg raise right: negative Straight leg raise left: negative  Right Shoulder Exam   Tenderness  The patient is experiencing tenderness in the acromion.  Range of Motion  Active Abduction: abnormal  Passive Abduction: abnormal  Extension: abnormal  Forward Flexion: abnormal  External Rotation: abnormal   Muscle Strength  Abduction: 3/5  Internal Rotation: 3/5  External Rotation: 3/5  Supraspinatus: 3/5  Subscapularis: 3/5  Biceps: 4/5   Tests  Apprehension: positive Drop Arm: positive Impingement: positive      Specialty Comments:  No specialty comments available.  Imaging: No results  found.   PMFS History: Patient Active Problem List   Diagnosis Date Noted  . LLQ pain 10/21/2016  . Angina pectoris ([place]) 10/15/2016  . Dysphagia 06/12/2016  . Sciatica 02/06/2016  . Plantar fasciitis 01/16/2016  . Right sided sciatica 05/16/2015  . Radicular pain in right arm 04/05/2015  . Hand pain 04/05/2015  . Premature ejaculation 09/17/2014  . Heel pain 09/17/2014  . NSTEMI (non-ST elevated myocardial infarction) ([place]) 06/13/2014  . Atypical chest pain 06/06/2014  . Memory change 05/24/2014  . Benign paroxysmal positional vertigo 07/13/2013  . Orthostasis 07/13/2013  . Migraine, unspecified, without mention of intractable migraine without mention of status migrainosus 04/03/2013  . Medicare annual wellness visit, initial 02/17/2013  . Pain in joint, ankle and foot 10/19/2012  . Irritable mood 01/18/2012  . Cough 12/21/2011  . Tobacco abuse 12/21/2011  . Skin lesion of face 04/27/2011  . Neck pain 12/03/2010  . Hearing loss 12/03/2010  . 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 HYPERCHOLESTEROLEMIA 10/20/2010  . DEPRESSION 10/20/2010  . OSTEOARTHRITIS 10/20/2010  . ARTHRITIS 10/20/2010  . NEPHROLITHIASIS, HX OF 10/20/2010  . CHICKENPOX, HX OF 10/20/2010  . ESSENTIAL HYPERTENSION, BENIGN 09/30/2010  . CORONARY ATHEROSCLEROSIS NATIVE CORONARY ARTERY 09/30/2010  . SLEEP APNEA 09/30/2010  . CHEST PAIN UNSPECIFIED 09/30/2010  . PERCUTANEOUS TRANSLUMINAL CORONARY ANGIOPLASTY, HX OF 09/30/2010   Past Medical History:  Diagnosis Date  . Anginal pain ([place])   . Basal cell carcinoma of face   . Chest pain, unspecified   . Coronary atherosclerosis of native coronary artery    s/p stent x4  . Depression   . Diabetes mellitus without mention of complication   . Diverticulosis   . Erectile dysfunction   . Essential hypertension, benign   . Heart murmur   . History of nephrolithiasis   . Migraine   . Osteoarthritis   . Postsurgical  percutaneous transluminal coronary angioplasty status    s/p stent x4  . Sarcoidosis   . Unspecified sleep apnea     Family History  Problem Relation Age of Onset  . Arthritis Mother   . Depression Mother   . Stroke Mother   . Arthritis Father   . Diabetes Father   . Coronary artery disease Father   . Colon cancer Neg Hx   . Prostate cancer Neg Hx   . Esophageal cancer Neg Hx   . Rectal cancer Neg Hx   . Stomach cancer Neg Hx     Past Surgical History:  Procedure Laterality Date  . APPENDECTOMY  2005  . CARPAL TUNNEL RELEASE    . CERVICAL FUSION  2009  . CORONARY ANGIOPLASTY WITH STENT PLACEMENT    . CORONARY STENT INTERVENTION N/A 10/16/2016   Procedure: Coronary Stent Intervention;  Surgeon: Adrian Prows, MD;  Location: [facility];  Service: Cardiovascular;  Laterality: N/A;  . ELBOW SURGERY     bilateral  . KNEE SURGERY     Bilateral   .  LEFT HEART CATH AND CORONARY ANGIOGRAPHY N/A 10/16/2016   Procedure: Left Heart Cath and Coronary Angiography;  Surgeon: Adrian Prows, MD;  Location: [facility];  Service: Cardiovascular;  Laterality: N/A;  . LEFT HEART CATHETERIZATION WITH CORONARY ANGIOGRAM N/A 06/14/2014   Procedure: LEFT HEART CATHETERIZATION WITH CORONARY ANGIOGRAM;  Surgeon: Laverda Page, MD;  Location: [facility];  Service: Cardiovascular;  Laterality: N/A;  . LUNG BIOPSY     sarcoid  . ROTATOR CUFF REPAIR     left   Social History   Occupational History  . disabled Unemployed   Social History Main Topics  . Smoking status: Never Smoker  . Smokeless tobacco: Former Systems developer    Types: [place] date: 02/06/2016     Comment: trying to quit  . Alcohol use No  . Drug use: No  . Sexual activity: Yes    Birth control/ protection: Condom

## 2017-01-24 ENCOUNTER — Inpatient Hospital Stay: Admission: RE | Admit: 2017-01-24 | Payer: Medicare HMO | Source: Ambulatory Visit

## 2017-02-02 ENCOUNTER — Ambulatory Visit
Admission: RE | Admit: 2017-02-02 | Discharge: 2017-02-02 | Disposition: A | Discharge status: Home / Self Care | Payer: Medicare HMO | Source: Ambulatory Visit | Attending: Specialist | Admitting: Specialist

## 2017-02-02 DIAGNOSIS — R29898 Other symptoms and signs involving the musculoskeletal system: Secondary | ICD-10-CM

## 2017-02-02 DIAGNOSIS — M25511 Pain in right shoulder: Secondary | ICD-10-CM | POA: Diagnosis not present

## 2017-02-02 DIAGNOSIS — G8929 Other chronic pain: Secondary | ICD-10-CM

## 2017-02-13 ENCOUNTER — Other Ambulatory Visit: Payer: Self-pay | Admitting: Family Medicine

## 2017-02-15 DIAGNOSIS — M5412 Radiculopathy, cervical region: Secondary | ICD-10-CM | POA: Diagnosis not present

## 2017-02-17 ENCOUNTER — Other Ambulatory Visit: Payer: Self-pay

## 2017-02-17 NOTE — Telephone Encounter (Signed)
Pt left v/m requesting rx oxycodone apap for neck pain. Call when ready for pick up. Last printed # 30 on 01/01/17. Pt last seen 12/02/16 for neck pain.

## 2017-02-18 ENCOUNTER — Encounter: Payer: Self-pay | Admitting: Family Medicine

## 2017-02-18 MED ORDER — OXYCODONE-ACETAMINOPHEN 5-325 MG PO TABS: 1.0000 | ORAL_TABLET | Freq: Three times a day (TID) | ORAL | 0 refills | Status: DC | PRN

## 2017-02-18 NOTE — Telephone Encounter (Signed)
Printed.  Thanks.  

## 2017-02-18 NOTE — Telephone Encounter (Signed)
Lm on pts vm and informed him Rx is available for pickup from the front desk. Pt advised third party unable to pickup  

## 2017-02-19 ENCOUNTER — Encounter: Payer: Self-pay | Admitting: Family Medicine

## 2017-02-19 DIAGNOSIS — Z79891 Long term (current) use of opiate analgesic: Secondary | ICD-10-CM | POA: Diagnosis not present

## 2017-02-22 ENCOUNTER — Other Ambulatory Visit: Payer: Self-pay | Admitting: Orthopedic Surgery

## 2017-02-24 ENCOUNTER — Ambulatory Visit (INDEPENDENT_AMBULATORY_CARE_PROVIDER_SITE_OTHER): Payer: Medicare HMO | Admitting: Family Medicine

## 2017-02-24 ENCOUNTER — Encounter: Payer: Self-pay | Admitting: Family Medicine

## 2017-02-24 VITALS — BP 114/70 | HR 77 | Temp 98.6°F | Wt 216.8 lb

## 2017-02-24 DIAGNOSIS — G4733 Obstructive sleep apnea (adult) (pediatric): Secondary | ICD-10-CM | POA: Diagnosis not present

## 2017-02-24 DIAGNOSIS — E1149 Type 2 diabetes mellitus with other diabetic neurological complication: Secondary | ICD-10-CM | POA: Diagnosis not present

## 2017-02-24 DIAGNOSIS — I25119 Atherosclerotic heart disease of native coronary artery with unspecified angina pectoris: Secondary | ICD-10-CM | POA: Diagnosis not present

## 2017-02-24 DIAGNOSIS — Z794 Long term (current) use of insulin: Secondary | ICD-10-CM | POA: Diagnosis not present

## 2017-02-24 DIAGNOSIS — I10 Essential (primary) hypertension: Secondary | ICD-10-CM | POA: Diagnosis not present

## 2017-02-24 DIAGNOSIS — Z9989 Dependence on other enabling machines and devices: Secondary | ICD-10-CM | POA: Diagnosis not present

## 2017-02-24 DIAGNOSIS — L0292 Furuncle, unspecified: Secondary | ICD-10-CM | POA: Diagnosis not present

## 2017-02-24 MED ORDER — DOXYCYCLINE HYCLATE 100 MG PO TABS: 100.0000 mg | ORAL_TABLET | Freq: Two times a day (BID) | ORAL | 0 refills | Status: DC

## 2017-02-24 NOTE — Progress Notes (Signed)
Diabetes:  Using medications without difficulties: yes Hypoglycemic episodes:no Hyperglycemic episodes:no Feet problems: numbness in the feet at baseline, intermittently.   Blood Sugars averaging:not checked often/recently.  eye exam within last year: yes Cardiology rec'd Jardiance given his CAD and DM2.  D/w pt.  A1c pending.  See notes on labs.   Boil on abdomen, noted in the last 2-3 days.  Tender but not yet draining.  No FCNAVD.  Some peripheral redness.  Using topical abx cream.    He ended up passing kidney stones after the last OV.   PMH and SH reviewed  Meds, vitals, and allergies reviewed.   ROS: Per HPI unless specifically indicated in ROS section   GEN: nad, alert and oriented CV: rrr. PULM: ctab, no inc wob ABD: soft, +bs EXT: no edema SKIN: no acute rash but 1x1 cm area of pink skin centrally with fainter 7x6cm peripherally.  Central small <<1cm closed comedone noted.

## 2017-02-24 NOTE — Patient Instructions (Signed)
Start doxycycline. Use warm compresses and update me if not better soon.  Go to the lab on the way out.  We'll contact you with your lab report. Take care.  Glad to see you.

## 2017-02-24 NOTE — Assessment & Plan Note (Addendum)
Cardiology rec'd Jardiance given his CAD and DM2.  D/w pt.  A1c pending.  See notes on labs.  No change in meds yet.

## 2017-02-25 DIAGNOSIS — L0292 Furuncle, unspecified: Secondary | ICD-10-CM | POA: Insufficient documentation

## 2017-02-25 LAB — HEMOGLOBIN A1C: Hgb A1c MFr Bld: 8.8 % — ABNORMAL HIGH (ref 4.6–6.5)

## 2017-02-25 NOTE — Assessment & Plan Note (Signed)
He has a very small central area of more inflamed tissue. It is only approximately 1 x 1 cm. Discussed with patient about options. It would likely do more damage to help. The issue to incise the area. He probably will be better off with starting doxycycline and using warm compresses to allow this to drain on its own. He agrees. Update me as needed. He is aware that his situation could worsen and we may have to still perform an I&D. Okay for outpatient follow-up.

## 2017-02-28 ENCOUNTER — Other Ambulatory Visit: Payer: Self-pay | Admitting: Family Medicine

## 2017-02-28 MED ORDER — EMPAGLIFLOZIN 10 MG PO TABS: 10.0000 mg | ORAL_TABLET | Freq: Every day | ORAL | 12 refills | Status: DC

## 2017-03-03 ENCOUNTER — Ambulatory Visit (INDEPENDENT_AMBULATORY_CARE_PROVIDER_SITE_OTHER): Payer: Medicare HMO | Admitting: Specialist

## 2017-03-03 ENCOUNTER — Ambulatory Visit (INDEPENDENT_AMBULATORY_CARE_PROVIDER_SITE_OTHER): Payer: Medicare HMO | Admitting: Internal Medicine

## 2017-03-03 ENCOUNTER — Encounter: Payer: Self-pay | Admitting: Internal Medicine

## 2017-03-03 VITALS — BP 122/78 | HR 64 | Temp 98.0°F | Wt 213.5 lb

## 2017-03-03 DIAGNOSIS — R82998 Other abnormal findings in urine: Secondary | ICD-10-CM

## 2017-03-03 DIAGNOSIS — R8299 Other abnormal findings in urine: Secondary | ICD-10-CM

## 2017-03-03 DIAGNOSIS — R300 Dysuria: Secondary | ICD-10-CM | POA: Diagnosis not present

## 2017-03-03 DIAGNOSIS — R109 Unspecified abdominal pain: Secondary | ICD-10-CM | POA: Diagnosis not present

## 2017-03-03 DIAGNOSIS — N200 Calculus of kidney: Secondary | ICD-10-CM

## 2017-03-03 DIAGNOSIS — R8290 Unspecified abnormal findings in urine: Secondary | ICD-10-CM | POA: Diagnosis not present

## 2017-03-03 DIAGNOSIS — R39198 Other difficulties with micturition: Secondary | ICD-10-CM | POA: Diagnosis not present

## 2017-03-03 NOTE — Patient Instructions (Signed)

## 2017-03-03 NOTE — Progress Notes (Signed)
Subjective:    Patient ID: Tony Manning, male    DOB: Apr 22, 1961, 56 y.o.   MRN: [id number]  HPI  Pt presents to the clinic today with c/o dysuria, difficulty passing urine, dark and cloudy urine. He reports this started this morning. He denies fever, chills or nausea. He has had some low back pain that radiates into the groin. He denies blood in his urine. He has not taken anything OTC for this but has been drinking a lot of cranberry juice. He has been having issues with kidney stones. He is already taking Flomax daily and Oxycodone for pain. He is requesting referral to urology.  Review of Systems      Past Medical History:  Diagnosis Date  . Anginal pain ([place])   . Basal cell carcinoma of face   . Chest pain, unspecified   . Coronary atherosclerosis of native coronary artery    s/p stent x4  . Depression   . Diabetes mellitus without mention of complication   . Diverticulosis   . Erectile dysfunction   . Essential hypertension, benign   . Heart murmur   . History of nephrolithiasis   . Migraine   . Osteoarthritis   . Postsurgical percutaneous transluminal coronary angioplasty status    s/p stent x4  . Sarcoidosis   . Unspecified sleep apnea     Current Outpatient Prescriptions  Medication Sig Dispense Refill  . aspirin 81 MG tablet Take 81 mg by mouth daily.     [name] Kitchen atorvastatin (LIPITOR) 40 MG tablet Take 1 tablet (40 mg total) by mouth daily. 90 tablet 3  . carvedilol (COREG) 3.125 MG tablet Take 1 tablet (3.125 mg total) by mouth 2 (two) times daily with a meal. 60 tablet 12  . clopidogrel (PLAVIX) 75 MG tablet Take 75 mg by mouth at bedtime.     [name] Kitchen doxycycline (VIBRA-TABS) 100 MG tablet Take 1 tablet (100 mg total) by mouth 2 (two) times daily. 20 tablet 0  . DULoxetine (CYMBALTA) 60 MG capsule Take 1 capsule (60 mg total) by mouth daily. 30 capsule 5  . empagliflozin (JARDIANCE) 10 MG TABS tablet Take 10 mg by mouth daily. 30 tablet 12  . glimepiride (AMARYL)  4 MG tablet Take 1 tablet (4 mg total) by mouth 2 (two) times daily. 180 tablet 3  . glucose blood (ONE TOUCH ULTRA TEST) test strip Check sugar up to 6 times a day.  DM2, insulin treated, uncontrolled. 200 each 12  . Insulin Detemir (LEVEMIR) 100 UNIT/ML Pen Inject 34 Units into the skin every morning. If AM sugar >120, add a unit. If AM sugar <80, decrease a unit.  If 80-120, then no change in dose    . Insulin Pen Needle 31G X 4 MM MISC 1 Device by Does not apply route daily. Use daily with insulin needle 100 each 3  . losartan (COZAAR) 100 MG tablet Take 0.5 tablets (50 mg total) by mouth 2 (two) times daily.    . metFORMIN (GLUCOPHAGE) 1000 MG tablet TAKE 1 TABLET BY MOUTH TWICE A DAY WITH A MEAL 180 tablet 1  . nitroGLYCERIN (NITROSTAT) 0.4 MG SL tablet Place 1 tablet (0.4 mg total) under the tongue every 5 (five) minutes as needed for chest pain. 25 tablet 3  . oxyCODONE-acetaminophen (ROXICET) 5-325 MG tablet Take 1 tablet by mouth every 8 (eight) hours as needed for severe pain. 30 tablet 0  . tamsulosin (FLOMAX) 0.4 MG CAPS capsule TAKE 1 CAPSULE (0.4 MG  TOTAL) BY MOUTH DAILY. 30 capsule 2   No current facility-administered medications for this visit.     Allergies  Allergen Reactions  . Ambien [Zolpidem] Other (See Comments)    Parasomnias, sleep walking  . Lisinopril Cough  . Tramadol     diarrhea    Family History  Problem Relation Age of Onset  . Arthritis Mother   . Depression Mother   . Stroke Mother   . Arthritis Father   . Diabetes Father   . Coronary artery disease Father   . Colon cancer Neg Hx   . Prostate cancer Neg Hx   . Esophageal cancer Neg Hx   . Rectal cancer Neg Hx   . Stomach cancer Neg Hx     Social History   Social History  . Marital status: Married    Spouse name: N/A  . Number of children: 2  . Years of education: N/A   Occupational History  . disabled Unemployed   Social History Main Topics  . Smoking status: Never Smoker  .  Smokeless tobacco: Former Systems developer    Types: [place] date: 02/06/2016     Comment: trying to quit  . Alcohol use No  . Drug use: No  . Sexual activity: Yes    Birth control/ protection: Condom   Other Topics Concern  . Not on file   Social History Narrative   Married, 1987   2 kids   On disability     Constitutional: Denies fever, malaise, fatigue, headache or abrupt weight changes.  GU: Pt reports flank pain, dysuria, difficulty urinating. Denies urgency, frequency, burning sensation, blood in urine, odor or discharge.  No other specific complaints in a complete review of systems (except as listed in HPI above).  Objective:   Physical Exam  BP 122/78   Pulse 64   Temp 98 F (36.7 C) (Oral)   Wt 213 lb 8 oz (96.8 kg)   SpO2 98%   BMI 30.63 kg/m  Wt Readings from Last 3 Encounters:  03/03/17 213 lb 8 oz (96.8 kg)  02/24/17 216 lb 12 oz (98.3 kg)  01/14/17 215 lb (97.5 kg)    General: Appears his stated age, obese in NAD. Abdomen: Soft and nontender. Normal bowel sounds. No distention or masses noted. Mild CVA tenderness noted bilaterally.   BMET    Component Value Date/Time   NA 136 10/26/2016 0917   K 3.9 10/26/2016 0917   CL 104 10/26/2016 0917   CO2 23 10/26/2016 0917   GLUCOSE 176 (H) 10/26/2016 0917   BUN 20 10/26/2016 0917   CREATININE 1.08 10/26/2016 0917   CALCIUM 9.5 10/26/2016 0917   GFRNONAA >90 06/15/2014 0354   GFRAA >90 06/15/2014 0354    Lipid Panel     Component Value Date/Time   CHOL 120 05/12/2016 1006   TRIG 101.0 05/12/2016 1006   HDL 41.70 05/12/2016 1006   CHOLHDL 3 05/12/2016 1006   VLDL 20.2 05/12/2016 1006   LDLCALC 58 05/12/2016 1006    CBC    Component Value Date/Time   WBC 8.7 10/26/2016 0917   RBC 5.30 10/26/2016 0917   HGB 16.1 10/26/2016 0917   HCT 47.7 10/26/2016 0917   PLT 219.0 10/26/2016 0917   MCV 89.9 10/26/2016 0917   MCH 29.9 10/16/2016 0541   MCHC 33.7 10/26/2016 0917   RDW 13.5 10/26/2016 0917    LYMPHSABS 1.6 10/26/2016 0917   MONOABS 0.7 10/26/2016 0917   EOSABS 0.3  10/26/2016 0917   BASOSABS 0.0 10/26/2016 0917    Hgb A1C Lab Results  Component Value Date   HGBA1C 8.8 (H) 02/24/2017            Assessment & Plan:   Flank Pain, Dysuria, Difficulty Urinating, Dark and Cloudy Urine:  He is unable to provide me with a urine sample after multiple attempts He refuses cath, states "I know this is kidney stone, not infection" Advised him to stop drinking soda's, push water Continue Flomax for now He declines ultrasound of bilateral kidneys at this time Referral to urology placed- see Rosaria Ferries on the way out to schedule  Return precautions discussed Webb Silversmith, NP

## 2017-03-04 ENCOUNTER — Other Ambulatory Visit: Payer: Self-pay | Admitting: Orthopedic Surgery

## 2017-03-05 ENCOUNTER — Telehealth: Payer: Self-pay | Admitting: *Deleted

## 2017-03-05 ENCOUNTER — Other Ambulatory Visit: Payer: Self-pay | Admitting: Family Medicine

## 2017-03-05 MED ORDER — DAPAGLIFLOZIN PROPANEDIOL 5 MG PO TABS: 5.0000 mg | ORAL_TABLET | Freq: Every day | ORAL | 3 refills | Status: DC

## 2017-03-05 NOTE — Telephone Encounter (Signed)
Left detailed message on voicemail.  

## 2017-03-05 NOTE — Telephone Encounter (Signed)
Pt left message at triage and states his blood sugar has been slightly elevated and he has been "somewhat dizzy." He was advised Dr [name] had no availability today and was offered an appt at an alternate location but "did not want to drive all the way across town." Pt is wanting to know if Dr [name] had any advise or if he could be worked in. pls advise

## 2017-03-05 NOTE — Progress Notes (Signed)
jardiance not covered, rx sent for farxiga.  Thanks.    Left detailed message on voicemail.   Mike Craze, CMA

## 2017-03-05 NOTE — Telephone Encounter (Signed)
Please clarify "somewhat dizzy" and "slightly elevated." Thanks.

## 2017-03-05 NOTE — Telephone Encounter (Signed)
If some better today, then continue to monitor, see what his sugars is during an episode, if it happens with position change (head turning, rolling over in bed, moving from sitting to standing).  Update [place] as needed.  See other phone note about DM2 med.  Thanks.

## 2017-03-05 NOTE — Telephone Encounter (Signed)
Patient says he is not staggeringly dizzy but has noticed feeling "swimmy-headed" at times.  He says it was worse yesterday than today and also has had some headache.  Blood sugars have been consistently running high from 196 up to 260.

## 2017-03-12 ENCOUNTER — Other Ambulatory Visit: Payer: Self-pay | Admitting: Family Medicine

## 2017-03-16 ENCOUNTER — Encounter: Payer: Self-pay | Admitting: Family Medicine

## 2017-03-16 ENCOUNTER — Ambulatory Visit (INDEPENDENT_AMBULATORY_CARE_PROVIDER_SITE_OTHER): Payer: Medicare HMO | Admitting: Family Medicine

## 2017-03-16 DIAGNOSIS — E1149 Type 2 diabetes mellitus with other diabetic neurological complication: Secondary | ICD-10-CM | POA: Diagnosis not present

## 2017-03-16 DIAGNOSIS — Z794 Long term (current) use of insulin: Secondary | ICD-10-CM

## 2017-03-16 DIAGNOSIS — M542 Cervicalgia: Secondary | ICD-10-CM | POA: Diagnosis not present

## 2017-03-16 MED ORDER — OXYCODONE-ACETAMINOPHEN 5-325 MG PO TABS: 1.0000 | ORAL_TABLET | Freq: Three times a day (TID) | ORAL | 0 refills | Status: DC | PRN

## 2017-03-16 MED ORDER — INSULIN DETEMIR 100 UNIT/ML FLEXPEN: 36.0000 [IU] | PEN_INJECTOR | Freq: Every morning | SUBCUTANEOUS | Status: DC

## 2017-03-16 NOTE — Patient Instructions (Signed)
Use the pain medicine as needed and update me about your sugar after the surgery if needed. Plan on a recheck in about 3 months, labs ahead of time.   Take care.  Glad to see you.

## 2017-03-16 NOTE — Progress Notes (Signed)
DM2. He couldn't afford either med substitution for DM2.  ~120-130 on home checks in the last few days.  Med list updated.  D/w pt about adjusting insulin based on sugars and working on diet.  He agrees.  See med list.    In obvious pain from his neck and shoulder.  He has surgery planned for 1.5 weeks from now.  Needed refill on pain meds.  No ADE on med.   He has urology f/u pending.  He passed a stone recently, then some fragments.    Meds, vitals, and allergies reviewed.   ROS: Per HPI unless specifically indicated in ROS section   No resp distress but uncomfortable from his neck.   Neck with pain on ROM.   rrr ctab Abd soft Ext w/o edema.

## 2017-03-16 NOTE — Pre-Procedure Instructions (Addendum)
Tony Manning  03/16/2017      [id number] [address] [place] [address] Phone: [phone] Fax: [phone]    Your procedure is scheduled on July 19  Report to [facility] Admitting at 1200noon  Call this number if you have problems the morning of surgery:  [phone]   Remember:  Do not eat food or drink liquids after midnight.  Take these medicines the morning of surgery with A SIP OF WATER Carvedilol (Coreg), Duloxetine (Cymbalta), Nitrostat if needed, Oxycodone (Percocet) if needed, ranitidine (Zantac), Tamsulosin (Flomax)  Stop/ take aspirin and Plavix as directed by your Dr.   Stop taking BC's, Goody's, Ibuprofen, Advil, Motrin, Aleve, Herbal medications Fish Oil, Vitamins     How to Manage Your Diabetes Before and After Surgery  Why is it important to control my blood sugar before and after surgery? . Improving blood sugar levels before and after surgery helps healing and can limit problems. . A way of improving blood sugar control is eating a healthy diet by: o  Eating less sugar and carbohydrates o  Increasing activity/exercise o  Talking with your doctor about reaching your blood sugar goals . High blood sugars (greater than 180 mg/dL) can raise your risk of infections and slow your recovery, so you will need to focus on controlling your diabetes during the weeks before surgery. . Make sure that the doctor who takes care of your diabetes knows about your planned surgery including the date and location.  How do I manage my blood sugar before surgery? . Check your blood sugar at least 4 times a day, starting 2 days before surgery, to make sure that the level is not too high or low. o Check your blood sugar the morning of your surgery when you wake up and every 2 hours until you get to the Short Stay unit. . If your blood sugar is less than 70 mg/dL, you will need to treat for low blood  sugar: o Do not take insulin. o Treat a low blood sugar (less than 70 mg/dL) with  cup of clear juice (cranberry or apple), 4 glucose tablets, OR glucose gel. o Recheck blood sugar in 15 minutes after treatment (to make sure it is greater than 70 mg/dL). If your blood sugar is not greater than 70 mg/dL on recheck, call [phone] for further instructions. . Report your blood sugar to the short stay nurse when you get to Short Stay.  . If you are admitted to the hospital after surgery: o Your blood sugar will be checked by the staff and you will probably be given insulin after surgery (instead of oral diabetes medicines) to make sure you have good blood sugar levels. o The goal for blood sugar control after surgery is 80-180 mg/dL.              WHAT DO I DO ABOUT MY DIABETES MEDICATION?   [name] Kitchen Do not take oral diabetes medicines (pills) the morning of surgery. Glimepiride (Amaryl), Metformin (Glucophage) . On;y take morning and lunch dose of Glimepiride (Amaryl) on the day before surgery.   . THE NIGHT BEFORE SURGERY, take ___________ units of ___________insulin.       [name] Kitchen HE MORNING OF SURGERY, take 15 units of Levemir insulin.  . The day of surgery, do not take other diabetes injectables, including Byetta (exenatide), Bydureon (exenatide ER), Victoza (liraglutide), or Trulicity (dulaglutide).  . If your CBG is greater  than 220 mg/dL, you may take  of your sliding scale (correction) dose of insulin.  Other Instructions:          Patient Signature:  Date:   Nurse Signature:  Date:   Reviewed and Endorsed by [facility] Patient Education Committee, August 2015  Do not wear jewelry, make-up or nail polish.  Do not wear lotions, powders, or perfumes, or deoderant.  Do not shave 48 hours prior to surgery.  Men may shave face and neck.  Do not bring valuables to the hospital.  [facility] is not responsible for any belongings or valuables.  Contacts, dentures or  bridgework may not be worn into surgery.  Leave your suitcase in the car.  After surgery it may be brought to your room.  For patients admitted to the hospital, discharge time will be determined by your treatment team.  Patients discharged the day of surgery will not be allowed to drive home.    Special instructions:  [place] - Preparing for Surgery  Before surgery, you can play an important role.  Because skin is not sterile, your skin needs to be as free of germs as possible.  You can reduce the number of germs on you skin by washing with CHG (chlorahexidine gluconate) soap before surgery.  CHG is an antiseptic cleaner which kills germs and bonds with the skin to continue killing germs even after washing.  Please DO NOT use if you have an allergy to CHG or antibacterial soaps.  If your skin becomes reddened/irritated stop using the CHG and inform your nurse when you arrive at Short Stay.  Do not shave (including legs and underarms) for at least 48 hours prior to the first CHG shower.  You may shave your face.  Please follow these instructions carefully:   1.  Shower with CHG Soap the night before surgery and the                                morning of Surgery.  2.  If you choose to wash your hair, wash your hair first as usual with your       normal shampoo.  3.  After you shampoo, rinse your hair and body thoroughly to remove the                      Shampoo.  4.  Use CHG as you would any other liquid soap.  You can apply chg directly       to the skin and wash gently with scrungie or a clean washcloth.  5.  Apply the CHG Soap to your body ONLY FROM THE NECK DOWN.        Do not use on open wounds or open sores.  Avoid contact with your eyes,       ears, mouth and genitals (private parts).  Wash genitals (private parts)       with your normal soap.  6.  Wash thoroughly, paying special attention to the area where your surgery        will be performed.  7.  Thoroughly rinse your body  with warm water from the neck down.  8.  DO NOT shower/wash with your normal soap after using and rinsing off       the CHG Soap.  9.  Pat yourself dry with a clean towel.  10.  Wear clean pajamas.            11.  Place clean sheets on your bed the night of your first shower and do not        sleep with pets.  Day of Surgery  Do not apply any lotions/deoderants the morning of surgery.  Please wear clean clothes to the hospital/surgery center.     Please read over the following fact sheets that you were given. Pain Booklet, Coughing and Deep Breathing, MRSA Information and Surgical Site Infection Prevention

## 2017-03-17 ENCOUNTER — Inpatient Hospital Stay (HOSPITAL_COMMUNITY)
Admission: RE | Admit: 2017-03-17 | Discharge: 2017-03-17 | Disposition: A | Discharge status: Home / Self Care | Payer: Medicare HMO | Source: Ambulatory Visit

## 2017-03-17 NOTE — Assessment & Plan Note (Signed)
He could not afford other oral agents. Continue as is with insulin, with him working on his diet and adjusting his insulin as needed. He agrees. Recheck periodically.

## 2017-03-17 NOTE — Assessment & Plan Note (Signed)
Prescription done for Percocet. Given to patient. Routine cautions given. No adverse effect on medication. Surgery scheduled for the near future.

## 2017-03-19 ENCOUNTER — Encounter (HOSPITAL_COMMUNITY): Payer: Self-pay | Admitting: Emergency Medicine

## 2017-03-19 ENCOUNTER — Inpatient Hospital Stay (HOSPITAL_COMMUNITY): Admission: RE | Admit: 2017-03-19 | Payer: Medicare HMO | Source: Ambulatory Visit

## 2017-03-25 ENCOUNTER — Ambulatory Visit (HOSPITAL_COMMUNITY): Admission: RE | Admit: 2017-03-25 | Payer: Medicare HMO | Source: Ambulatory Visit | Admitting: Orthopedic Surgery

## 2017-03-25 ENCOUNTER — Encounter (HOSPITAL_COMMUNITY): Admission: RE | Payer: Self-pay | Source: Ambulatory Visit

## 2017-03-25 ENCOUNTER — Telehealth: Payer: Self-pay

## 2017-03-25 SURGERY — ANTERIOR CERVICAL DECOMPRESSION/DISCECTOMY FUSION 1 LEVEL
Anesthesia: General | Laterality: Bilateral

## 2017-03-25 NOTE — Telephone Encounter (Signed)
Patient is on the list for [employer] and may be a good candidate for an AWV. Please let me know if/when appt is scheduled.   

## 2017-03-26 ENCOUNTER — Other Ambulatory Visit: Payer: Self-pay | Admitting: Orthopedic Surgery

## 2017-03-30 NOTE — Telephone Encounter (Signed)
Spoke to pt. He would like me to call him back in October to schedule. He is getting ready to have surgery next week and will have a month and a half recovery period.

## 2017-03-31 NOTE — Pre-Procedure Instructions (Signed)
Tony Manning  03/31/2017      [id number] [address], [place] [address] [place] [address] Phone: [phone] Fax: [phone]    Your procedure is scheduled on April 07, 2017  Report to [facility] Admitting Entrance "A" at 11:30 A.M.  Call this number if you have problems the morning of surgery:  [phone]   Remember:  Do not eat food or drink liquids after midnight on April 06, 2017  Take these medicines the morning of surgery with A SIP OF WATER: Carvedilol (COREG), DULoxetine (CYMBALTA), Ranitidine (ZANTAC), Tamsulosin (FLOMAX). If needed OxyCODONE-acetaminophen (ROXICET) for pain and NitroGLYCERIN (NITROSTAT) for chest pain (notify the nurse if you had to take this before arrival).  Take your Aspirin and Plavix as directed by your doctor.  7 days before surgery stop taking all Vitamins, Fish oils, and Herbal medications. Also all NSAIDS i.e. Advil, Motrin, Aleve, Anaprox, Naproxen, BC and Goody Powders.  How to Manage Your Diabetes Before and After Surgery  Why is it important to control my blood sugar before and after surgery? . Improving blood sugar levels before and after surgery helps healing and can limit problems. . A way of improving blood sugar control is eating a healthy diet by: o  Eating less sugar and carbohydrates o  Increasing activity/exercise o  Talking with your doctor about reaching your blood sugar goals . High blood sugars (greater than 180 mg/dL) can raise your risk of infections and slow your recovery, so you will need to focus on controlling your diabetes during the weeks before surgery. . Make sure that the doctor who takes care of your diabetes knows about your planned surgery including the date and location.  How do I manage my blood sugar before surgery? . Check your blood sugar at least 4 times a day, starting 2 days before surgery, to make sure that the level is not too high or  low. o Check your blood sugar the morning of your surgery when you wake up and every 2 hours until you get to the Short Stay unit. . If your blood sugar is less than 70 mg/dL, you will need to treat for low blood sugar: o Do not take insulin. o Treat a low blood sugar (less than 70 mg/dL) with  cup of clear juice (cranberry or apple), 4 glucose tablets, OR glucose gel. o Recheck blood sugar in 15 minutes after treatment (to make sure it is greater than 70 mg/dL). If your blood sugar is not greater than 70 mg/dL on recheck, call [phone] for further instructions. . Report your blood sugar to the short stay nurse when you get to Short Stay.  . If you are admitted to the hospital after surgery: o Your blood sugar will be checked by the staff and you will probably be given insulin after surgery (instead of oral diabetes medicines) to make sure you have good blood sugar levels. o The goal for blood sugar control after surgery is 80-180 mg/dL.  WHAT DO I DO ABOUT MY DIABETES MEDICATION?   [name] Kitchen Do not take MetFORMIN (GLUCOPHAGE) and Glimepiride (AMARYL)  the morning of surgery.  . THE DAY BEFORE SURGERY, take morning dose of  Glimepiride (AMARYL) .  THE DAY BEFORE SURGERY, take normal doses of MetFORMIN (GLUCOPHAGE)     . THE MORNING OF SURGERY, take _______15-16______ units of _Insulin Detemir (LEVEMIR)_________insulin.   Do not wear jewelry.  Do not wear lotions, powders, or perfumes,  or deoderant.  Do not shave 48 hours prior to surgery.  Men may shave face and neck.  Do not bring valuables to the hospital.  [facility] is not responsible for any belongings or valuables.  Contacts, dentures or bridgework may not be worn into surgery.  Leave your suitcase in the car.  After surgery it may be brought to your room.  For patients admitted to the hospital, discharge time will be determined by your treatment team.  Patients discharged the day of surgery will not be allowed to drive home.    Special instructions:    [place]- Preparing For Surgery  Before surgery, you can play an important role. Because skin is not sterile, your skin needs to be as free of germs as possible. You can reduce the number of germs on your skin by washing with CHG (chlorahexidine gluconate) Soap before surgery.  CHG is an antiseptic cleaner which kills germs and bonds with the skin to continue killing germs even after washing.  Please do not use if you have an allergy to CHG or antibacterial soaps. If your skin becomes reddened/irritated stop using the CHG.  Do not shave (including legs and underarms) for at least 48 hours prior to first CHG shower. It is OK to shave your face.  Please follow these instructions carefully.   1. Shower the NIGHT BEFORE SURGERY and the MORNING OF SURGERY with CHG.   2. If you chose to wash your hair, wash your hair first as usual with your normal shampoo.  3. After you shampoo, rinse your hair and body thoroughly to remove the shampoo.  4. Use CHG as you would any other liquid soap. You can apply CHG directly to the skin and wash gently with a scrungie or a clean washcloth.   5. Apply the CHG Soap to your body ONLY FROM THE NECK DOWN.  Do not use on open wounds or open sores. Avoid contact with your eyes, ears, mouth and genitals (private parts). Wash genitals (private parts) with your normal soap.  6. Wash thoroughly, paying special attention to the area where your surgery will be performed.  7. Thoroughly rinse your body with warm water from the neck down.  8. DO NOT shower/wash with your normal soap after using and rinsing off the CHG Soap.  9. Pat yourself dry with a CLEAN TOWEL.   10. Wear CLEAN PAJAMAS   11. Place CLEAN SHEETS on your bed the night of your first shower and DO NOT SLEEP WITH PETS.  Day of Surgery: Do not apply any deodorants/lotions. Please wear clean clothes to the hospital/surgery center.    Please read over the following fact  sheets that you were given. Pain Booklet, Coughing and Deep Breathing, MRSA Information and Surgical Site Infection Prevention

## 2017-04-01 ENCOUNTER — Encounter (HOSPITAL_COMMUNITY)
Admission: RE | Admit: 2017-04-01 | Discharge: 2017-04-01 | Disposition: A | Discharge status: Home / Self Care | Payer: Medicare HMO | Source: Ambulatory Visit | Attending: Orthopedic Surgery | Admitting: Orthopedic Surgery

## 2017-04-01 ENCOUNTER — Ambulatory Visit (HOSPITAL_COMMUNITY)
Admission: RE | Admit: 2017-04-01 | Discharge: 2017-04-01 | Disposition: A | Discharge status: Home / Self Care | Payer: Medicare HMO | Source: Ambulatory Visit | Attending: Orthopedic Surgery | Admitting: Orthopedic Surgery

## 2017-04-01 ENCOUNTER — Encounter (HOSPITAL_COMMUNITY): Payer: Self-pay

## 2017-04-01 DIAGNOSIS — Z7984 Long term (current) use of oral hypoglycemic drugs: Secondary | ICD-10-CM | POA: Diagnosis not present

## 2017-04-01 DIAGNOSIS — I2510 Atherosclerotic heart disease of native coronary artery without angina pectoris: Secondary | ICD-10-CM | POA: Diagnosis not present

## 2017-04-01 DIAGNOSIS — I10 Essential (primary) hypertension: Secondary | ICD-10-CM | POA: Diagnosis not present

## 2017-04-01 DIAGNOSIS — Z01812 Encounter for preprocedural laboratory examination: Secondary | ICD-10-CM | POA: Insufficient documentation

## 2017-04-01 DIAGNOSIS — Z7982 Long term (current) use of aspirin: Secondary | ICD-10-CM | POA: Diagnosis not present

## 2017-04-01 DIAGNOSIS — Z01818 Encounter for other preprocedural examination: Secondary | ICD-10-CM | POA: Insufficient documentation

## 2017-04-01 DIAGNOSIS — D869 Sarcoidosis, unspecified: Secondary | ICD-10-CM | POA: Diagnosis not present

## 2017-04-01 DIAGNOSIS — K219 Gastro-esophageal reflux disease without esophagitis: Secondary | ICD-10-CM | POA: Diagnosis not present

## 2017-04-01 DIAGNOSIS — R918 Other nonspecific abnormal finding of lung field: Secondary | ICD-10-CM | POA: Diagnosis not present

## 2017-04-01 DIAGNOSIS — Z6830 Body mass index (BMI) 30.0-30.9, adult: Secondary | ICD-10-CM | POA: Insufficient documentation

## 2017-04-01 DIAGNOSIS — G4733 Obstructive sleep apnea (adult) (pediatric): Secondary | ICD-10-CM | POA: Insufficient documentation

## 2017-04-01 DIAGNOSIS — E119 Type 2 diabetes mellitus without complications: Secondary | ICD-10-CM | POA: Insufficient documentation

## 2017-04-01 DIAGNOSIS — F329 Major depressive disorder, single episode, unspecified: Secondary | ICD-10-CM | POA: Diagnosis not present

## 2017-04-01 DIAGNOSIS — I252 Old myocardial infarction: Secondary | ICD-10-CM | POA: Diagnosis not present

## 2017-04-01 DIAGNOSIS — E669 Obesity, unspecified: Secondary | ICD-10-CM | POA: Diagnosis not present

## 2017-04-01 DIAGNOSIS — Z79899 Other long term (current) drug therapy: Secondary | ICD-10-CM | POA: Insufficient documentation

## 2017-04-01 DIAGNOSIS — R69 Illness, unspecified: Secondary | ICD-10-CM | POA: Diagnosis not present

## 2017-04-01 HISTORY — DX: Cardiac arrhythmia, unspecified: I49.9

## 2017-04-01 HISTORY — DX: Dyspnea, unspecified: R06.00

## 2017-04-01 HISTORY — DX: Personal history of urinary calculi: Z87.442

## 2017-04-01 HISTORY — DX: Acute myocardial infarction, unspecified: I21.9

## 2017-04-01 HISTORY — DX: Pneumonia, unspecified organism: J18.9

## 2017-04-01 HISTORY — DX: Gastro-esophageal reflux disease without esophagitis: K21.9

## 2017-04-01 HISTORY — DX: Myoneural disorder, unspecified: G70.9

## 2017-04-01 LAB — CBC WITH DIFFERENTIAL/PLATELET
Basophils Absolute: 0 10*3/uL (ref 0.0–0.1)
Basophils Relative: 0 %
Eosinophils Absolute: 0.2 10*3/uL (ref 0.0–0.7)
Eosinophils Relative: 2 %
HCT: 43.9 % (ref 39.0–52.0)
Hemoglobin: 14.3 g/dL (ref 13.0–17.0)
Lymphocytes Relative: 20 %
Lymphs Abs: 1.6 10*3/uL (ref 0.7–4.0)
MCH: 29 pg (ref 26.0–34.0)
MCHC: 32.6 g/dL (ref 30.0–36.0)
MCV: 89 fL (ref 78.0–100.0)
Monocytes Absolute: 0.5 10*3/uL (ref 0.1–1.0)
Monocytes Relative: 6 %
Neutro Abs: 5.9 10*3/uL (ref 1.7–7.7)
Neutrophils Relative %: 72 %
Platelets: 232 10*3/uL (ref 150–400)
RBC: 4.93 MIL/uL (ref 4.22–5.81)
RDW: 13.3 % (ref 11.5–15.5)
WBC: 8.2 10*3/uL (ref 4.0–10.5)

## 2017-04-01 LAB — URINALYSIS, ROUTINE W REFLEX MICROSCOPIC
Bilirubin Urine: NEGATIVE
Glucose, UA: >=500 mg/dL — AB
Hgb urine dipstick: NEGATIVE
Ketones, ur: NEGATIVE mg/dL
Leukocytes, UA: NEGATIVE
Nitrite: NEGATIVE
Protein, ur: NEGATIVE mg/dL
Specific Gravity, Urine: >1.03 — ABNORMAL HIGH (ref 1.005–1.030)
pH: 5 (ref 5.0–8.0)

## 2017-04-01 LAB — COMPREHENSIVE METABOLIC PANEL
ALT: 38 U/L (ref 17–63)
AST: 32 U/L (ref 15–41)
Albumin: 4.4 g/dL (ref 3.5–5.0)
Alkaline Phosphatase: 70 U/L (ref 38–126)
Anion gap: 10 (ref 5–15)
BUN: 17 mg/dL (ref 6–20)
CO2: 24 mmol/L (ref 22–32)
Calcium: 9.9 mg/dL (ref 8.9–10.3)
Chloride: 103 mmol/L (ref 101–111)
Creatinine, Ser: 1.17 mg/dL (ref 0.61–1.24)
GFR calc Af Amer: >60 mL/min (ref >60)
GFR calc non Af Amer: >60 mL/min (ref >60)
Glucose, Bld: 285 mg/dL — ABNORMAL HIGH (ref 65–99)
Potassium: 4.4 mmol/L (ref 3.5–5.1)
Sodium: 137 mmol/L (ref 135–145)
Total Bilirubin: 0.8 mg/dL (ref 0.3–1.2)
Total Protein: 7.4 g/dL (ref 6.5–8.1)

## 2017-04-01 LAB — PROTIME-INR
INR: 0.98
Prothrombin Time: 13 seconds (ref 11.4–15.2)

## 2017-04-01 LAB — URINALYSIS, MICROSCOPIC (REFLEX)
Bacteria, UA: NONE SEEN
RBC / HPF: NONE SEEN RBC/hpf (ref 0–5)

## 2017-04-01 LAB — SURGICAL PCR SCREEN
MRSA, PCR: NEGATIVE
Staphylococcus aureus: NEGATIVE

## 2017-04-01 LAB — GLUCOSE, CAPILLARY: Glucose-Capillary: 235 mg/dL — ABNORMAL HIGH (ref 65–99)

## 2017-04-01 LAB — APTT: aPTT: 28 seconds (ref 24–36)

## 2017-04-01 NOTE — Progress Notes (Signed)
PCP is Dr. [name] (he also manages his DM) Cardiologist is Dr. Einar Gip Card cath noted 10-16-16 Stress test noted 06-05-16 Reports his fasting CBG's run 110-160's Denies any chest pain, fever, or cough. Instructed to bring in his CPAP on the day of surgery. Reports he is to stop Plavix after today (04-01-17) Instructed him to call Dr Einar Gip to see when and if to stop taking his aspirin- voices understanding.

## 2017-04-05 NOTE — Progress Notes (Addendum)
Anesthesia Chart Review: Patient is a 56 year old male scheduled for C4-5 ACDF on 04/07/17 by Dr. Lynann Bologna. He was initially scheduled for 03/19/17, but was apparently postponed because he did not show for his PAT visit.   History includes never smoker, HTN, CAD (MI 11/2005 and STEMI 06/14/14; s/p DES LAD and PTCA OM1 07/30/03; s/p DES RCA 11/19/05; s/p thrombectomy/DES RCA and DES LAD 06/14/14; DES RCA 10/16/16), dysrhythmia (not specified), murmur, DM2, OSA, depression, migraines, skin cancer ([place]), Sarcoidosis, ED, nephrolithiasis, dyspnea, GERD, neuromuscular disorder (not specified), C5-7 ACDF 12/29/07. BMI is consistent with mild obesity.   - PCP is Dr. [name]. - Cardiologist is Dr. Adrian Prows. According 02/24/17 note (scanned under Media tab), "He clearly developed significant neurologic deficits including wasting in his right forearm and continue to have severe pain that is disabled him at night and unable to sleep, affecting his diabetes control and inability to exercise. After long discussion, I have recommended that he undergo surgery and we will the holding his dual antiplatelet therapy for 6 days prior to surgery and start immediately post op, probably start with aspirin if possible and then add Brilinta in 4-5 days when he is stable."  Meds include aspirin 81 mg, Lipitor, Coreg, Plavix (last dose 04/01/17), Cymbalta, Amaryl, Levemir, losartan, metformin, nitroglycerin, Roxicet, Zantac, Flomax. PAT RN notes indicate that patient was instructed to follow-up regarding ASA instructions. (I've left a message with Angela Nevin at Dr. Laurena Bering office to contact patient to clarify ASA instructions.) (Update 04/06/17 9:12 AM: Per Angela Nevin, last ASA 04/03/17.)  BP 136/70   Pulse 98   Temp (!) 36.4 C   Resp 20   Ht 5' 10.5" (1.791 m)   Wt 215 lb (97.5 kg)   SpO2 100%   BMI 30.41 kg/m   EKG 10/16/16: NSR.  Cardiac cath 10/16/16:  Mid Cx to Dist Cx lesion, 65 %stenosed.  1st Mrg lesion, 90  %stenosed.  Dist LAD lesion, 40 %stenosed.  Mid LAD lesion, 0 %stenosed.  Prox RCA-1 lesion, 90 %stenosed.  A STENT RESOLUTE [id number] drug eluting stent was successfully placed, and overlaps previously placed stent.  Prox RCA-2 lesion, 60 %stenosed.  Post intervention, there is a 0% residual stenosis.  Mid RCA to Dist RCA lesion, 10 %stenosed.  Dist RCA lesion, 5 %stenosed.  Ost RPDA to RPDA lesion, 60 %stenosed.  The left ventricular systolic function is normal.  LV end diastolic pressure is normal. Coronary angiogram 10/26/2016: Severe diffuse triple-vessel coronary artery disease, circumflex coronary arteries diffusely diseased and very small calibered with multiple lesions. Highly OM1 is very large and is relatively well-preserved. OM 2 and 3 are diffusely diseased with OM 2 midsegment 90% stenosis.  - Mid LAD stents are widely patent with diffuse disease in the distal LAD.  - Proximal RCA has a 90% stenosis. Mid RCA stent has in-stent restenosis of 60%. Distal ICA stent is minimally narrowed. S/P 3.0 x 38 mm Onyx in the proximal RCA covering the previously placed mid RCA stent, 90% to 0%. - LVEF 55-60%. Normal LVEDP.  Last stress test 06/05/16 (scanned under Results Review tab).  CXR 04/01/17: IMPRESSION: Postsurgical changes right lung base. No acute cardiopulmonary disease.  Preoperative labs noted. Cr 1.17, glucose 185. CBC WNL. PT/PTT WNL. A1c on 02/24/17 was 8.8.  Patient with DES 10/2016, but Dr. Einar Gip is aware of surgery plans and has given permission to hold DAPT temporarily for surgery given patient's surgery. Anesthesiologist to evaluate on the day of surgery. He will get a  CBG on the day of surgery.   George Hugh [facility] Short Stay Center/Anesthesiology Phone [phone] 04/05/2017 2:47 PM

## 2017-04-07 ENCOUNTER — Ambulatory Visit (HOSPITAL_COMMUNITY): Payer: Medicare HMO | Admitting: Registered Nurse

## 2017-04-07 ENCOUNTER — Encounter (HOSPITAL_COMMUNITY): Payer: Self-pay | Admitting: Urology

## 2017-04-07 ENCOUNTER — Ambulatory Visit (HOSPITAL_COMMUNITY): Payer: Medicare HMO

## 2017-04-07 ENCOUNTER — Observation Stay (HOSPITAL_COMMUNITY)
Admission: RE | Admit: 2017-04-07 | Discharge: 2017-04-08 | Disposition: A | Discharge status: Home / Self Care | Payer: Medicare HMO | Source: Ambulatory Visit | Attending: Orthopedic Surgery | Admitting: Orthopedic Surgery

## 2017-04-07 ENCOUNTER — Ambulatory Visit (HOSPITAL_COMMUNITY): Payer: Medicare HMO | Admitting: Vascular Surgery

## 2017-04-07 ENCOUNTER — Encounter (HOSPITAL_COMMUNITY)
Admission: RE | Disposition: A | Discharge status: Home / Self Care | Payer: Self-pay | Source: Ambulatory Visit | Attending: Orthopedic Surgery

## 2017-04-07 DIAGNOSIS — I2510 Atherosclerotic heart disease of native coronary artery without angina pectoris: Secondary | ICD-10-CM | POA: Diagnosis not present

## 2017-04-07 DIAGNOSIS — M9981 Other biomechanical lesions of cervical region: Secondary | ICD-10-CM | POA: Diagnosis not present

## 2017-04-07 DIAGNOSIS — Z794 Long term (current) use of insulin: Secondary | ICD-10-CM | POA: Insufficient documentation

## 2017-04-07 DIAGNOSIS — G4730 Sleep apnea, unspecified: Secondary | ICD-10-CM | POA: Diagnosis not present

## 2017-04-07 DIAGNOSIS — E119 Type 2 diabetes mellitus without complications: Secondary | ICD-10-CM | POA: Insufficient documentation

## 2017-04-07 DIAGNOSIS — M1990 Unspecified osteoarthritis, unspecified site: Secondary | ICD-10-CM | POA: Insufficient documentation

## 2017-04-07 DIAGNOSIS — Z85828 Personal history of other malignant neoplasm of skin: Secondary | ICD-10-CM | POA: Insufficient documentation

## 2017-04-07 DIAGNOSIS — D869 Sarcoidosis, unspecified: Secondary | ICD-10-CM | POA: Insufficient documentation

## 2017-04-07 DIAGNOSIS — Z87442 Personal history of urinary calculi: Secondary | ICD-10-CM | POA: Insufficient documentation

## 2017-04-07 DIAGNOSIS — K219 Gastro-esophageal reflux disease without esophagitis: Secondary | ICD-10-CM | POA: Insufficient documentation

## 2017-04-07 DIAGNOSIS — M542 Cervicalgia: Secondary | ICD-10-CM | POA: Diagnosis not present

## 2017-04-07 DIAGNOSIS — R69 Illness, unspecified: Secondary | ICD-10-CM | POA: Diagnosis not present

## 2017-04-07 DIAGNOSIS — M5412 Radiculopathy, cervical region: Secondary | ICD-10-CM | POA: Diagnosis not present

## 2017-04-07 DIAGNOSIS — M50221 Other cervical disc displacement at C4-C5 level: Secondary | ICD-10-CM | POA: Diagnosis not present

## 2017-04-07 DIAGNOSIS — Z419 Encounter for procedure for purposes other than remedying health state, unspecified: Secondary | ICD-10-CM

## 2017-04-07 DIAGNOSIS — Z7982 Long term (current) use of aspirin: Secondary | ICD-10-CM | POA: Insufficient documentation

## 2017-04-07 DIAGNOSIS — G9529 Other cord compression: Secondary | ICD-10-CM | POA: Diagnosis not present

## 2017-04-07 DIAGNOSIS — F329 Major depressive disorder, single episode, unspecified: Secondary | ICD-10-CM | POA: Diagnosis not present

## 2017-04-07 DIAGNOSIS — I252 Old myocardial infarction: Secondary | ICD-10-CM | POA: Insufficient documentation

## 2017-04-07 DIAGNOSIS — M4802 Spinal stenosis, cervical region: Secondary | ICD-10-CM | POA: Diagnosis not present

## 2017-04-07 DIAGNOSIS — Z79899 Other long term (current) drug therapy: Secondary | ICD-10-CM | POA: Insufficient documentation

## 2017-04-07 DIAGNOSIS — Z955 Presence of coronary angioplasty implant and graft: Secondary | ICD-10-CM | POA: Insufficient documentation

## 2017-04-07 DIAGNOSIS — I10 Essential (primary) hypertension: Secondary | ICD-10-CM | POA: Diagnosis not present

## 2017-04-07 DIAGNOSIS — M5410 Radiculopathy, site unspecified: Secondary | ICD-10-CM | POA: Diagnosis present

## 2017-04-07 DIAGNOSIS — M79602 Pain in left arm: Secondary | ICD-10-CM | POA: Diagnosis not present

## 2017-04-07 DIAGNOSIS — M79601 Pain in right arm: Secondary | ICD-10-CM | POA: Diagnosis not present

## 2017-04-07 HISTORY — PX: ANTERIOR CERVICAL DECOMP/DISCECTOMY FUSION: SHX1161

## 2017-04-07 LAB — GLUCOSE, CAPILLARY
Glucose-Capillary: 186 mg/dL — ABNORMAL HIGH (ref 65–99)
Glucose-Capillary: 142 mg/dL — ABNORMAL HIGH (ref 65–99)
Glucose-Capillary: 138 mg/dL — ABNORMAL HIGH (ref 65–99)
Glucose-Capillary: 146 mg/dL — ABNORMAL HIGH (ref 65–99)
Glucose-Capillary: 122 mg/dL — ABNORMAL HIGH (ref 65–99)

## 2017-04-07 SURGERY — ANTERIOR CERVICAL DECOMPRESSION/DISCECTOMY FUSION 1 LEVEL
Anesthesia: General

## 2017-04-07 MED ORDER — TAMSULOSIN HCL 0.4 MG PO CAPS
0.4000 mg | ORAL_CAPSULE | Freq: Every day | ORAL | Status: DC
  Administered 2017-04-08: 0.4 mg via ORAL
  Filled 2017-04-07: qty 1

## 2017-04-07 MED ORDER — FENTANYL CITRATE (PF) 250 MCG/5ML IJ SOLN
INTRAMUSCULAR | Status: AC
  Filled 2017-04-07: qty 5

## 2017-04-07 MED ORDER — ONDANSETRON HCL 4 MG/2ML IJ SOLN
4.0000 mg | Freq: Four times a day (QID) | INTRAMUSCULAR | Status: DC | PRN
  Administered 2017-04-07: 4 mg via INTRAVENOUS
  Filled 2017-04-07: qty 2

## 2017-04-07 MED ORDER — THROMBIN 20000 UNITS EX SOLR
CUTANEOUS | Status: AC
  Filled 2017-04-07: qty 20000

## 2017-04-07 MED ORDER — DEXTROSE 5 % IV SOLN
INTRAVENOUS | Status: DC | PRN
  Administered 2017-04-07: 10 ug/min via INTRAVENOUS

## 2017-04-07 MED ORDER — LACTATED RINGERS IV SOLN
INTRAVENOUS | Status: DC
  Administered 2017-04-07: 08:00:00 via INTRAVENOUS

## 2017-04-07 MED ORDER — CEFAZOLIN SODIUM-DEXTROSE 2-4 GM/100ML-% IV SOLN
2.0000 g | Freq: Three times a day (TID) | INTRAVENOUS | Status: AC
  Administered 2017-04-07 (×2): 2 g via INTRAVENOUS
  Filled 2017-04-07 (×2): qty 100

## 2017-04-07 MED ORDER — SENNOSIDES-DOCUSATE SODIUM 8.6-50 MG PO TABS: 1.0000 | ORAL_TABLET | Freq: Every evening | ORAL | Status: DC | PRN

## 2017-04-07 MED ORDER — MENTHOL 3 MG MT LOZG
1.0000 | LOZENGE | OROMUCOSAL | Status: DC | PRN
  Filled 2017-04-07 (×2): qty 9

## 2017-04-07 MED ORDER — BUPIVACAINE-EPINEPHRINE 0.25% -1:200000 IJ SOLN
INTRAMUSCULAR | Status: DC | PRN
  Administered 2017-04-07: 10 mL

## 2017-04-07 MED ORDER — INSULIN DETEMIR 100 UNIT/ML ~~LOC~~ SOLN
30.0000 [IU] | Freq: Every morning | SUBCUTANEOUS | Status: DC
Start: 2017-04-08 — End: 2017-04-08
  Filled 2017-04-07: qty 0.3

## 2017-04-07 MED ORDER — ONDANSETRON HCL 4 MG/2ML IJ SOLN
INTRAMUSCULAR | Status: AC
  Filled 2017-04-07: qty 4

## 2017-04-07 MED ORDER — GLIMEPIRIDE 2 MG PO TABS
4.0000 mg | ORAL_TABLET | Freq: Two times a day (BID) | ORAL | Status: DC
  Administered 2017-04-07 – 2017-04-08 (×2): 4 mg via ORAL
  Filled 2017-04-07 (×3): qty 2

## 2017-04-07 MED ORDER — FAMOTIDINE 20 MG PO TABS
10.0000 mg | ORAL_TABLET | Freq: Every day | ORAL | Status: DC
  Administered 2017-04-08: 10 mg via ORAL
  Filled 2017-04-07: qty 1

## 2017-04-07 MED ORDER — MIDAZOLAM HCL 2 MG/2ML IJ SOLN
INTRAMUSCULAR | Status: AC
  Filled 2017-04-07: qty 2

## 2017-04-07 MED ORDER — FENTANYL CITRATE (PF) 100 MCG/2ML IJ SOLN
INTRAMUSCULAR | Status: DC | PRN
  Administered 2017-04-07 (×2): 50 ug via INTRAVENOUS
  Administered 2017-04-07: 150 ug via INTRAVENOUS

## 2017-04-07 MED ORDER — METOCLOPRAMIDE HCL 5 MG/ML IJ SOLN: 10.0000 mg | Freq: Once | INTRAMUSCULAR | Status: DC | PRN

## 2017-04-07 MED ORDER — PROPOFOL 10 MG/ML IV BOLUS
INTRAVENOUS | Status: AC
  Filled 2017-04-07: qty 20

## 2017-04-07 MED ORDER — ACETAMINOPHEN 650 MG RE SUPP: 650.0000 mg | RECTAL | Status: DC | PRN

## 2017-04-07 MED ORDER — THROMBIN 20000 UNITS EX KIT
PACK | CUTANEOUS | Status: DC | PRN
  Administered 2017-04-07: 20000 [IU] via TOPICAL

## 2017-04-07 MED ORDER — INSULIN ASPART 100 UNIT/ML ~~LOC~~ SOLN
0.0000 [IU] | Freq: Three times a day (TID) | SUBCUTANEOUS | Status: DC
  Administered 2017-04-07: 2 [IU] via SUBCUTANEOUS

## 2017-04-07 MED ORDER — ALBUMIN HUMAN 5 % IV SOLN
INTRAVENOUS | Status: DC | PRN
  Administered 2017-04-07: 12:00:00 via INTRAVENOUS

## 2017-04-07 MED ORDER — EPHEDRINE SULFATE 50 MG/ML IJ SOLN
INTRAMUSCULAR | Status: DC | PRN
  Administered 2017-04-07: 10 mg via INTRAVENOUS
  Administered 2017-04-07: 5 mg via INTRAVENOUS

## 2017-04-07 MED ORDER — DEXAMETHASONE SODIUM PHOSPHATE 10 MG/ML IJ SOLN
INTRAMUSCULAR | Status: AC
  Filled 2017-04-07: qty 1

## 2017-04-07 MED ORDER — DIAZEPAM 5 MG PO TABS
5.0000 mg | ORAL_TABLET | Freq: Four times a day (QID) | ORAL | Status: DC | PRN
  Administered 2017-04-07 – 2017-04-08 (×2): 5 mg via ORAL
  Filled 2017-04-07 (×2): qty 1

## 2017-04-07 MED ORDER — ROCURONIUM BROMIDE 100 MG/10ML IV SOLN
INTRAVENOUS | Status: DC | PRN
  Administered 2017-04-07: 10 mg via INTRAVENOUS
  Administered 2017-04-07: 50 mg via INTRAVENOUS
  Administered 2017-04-07 (×2): 10 mg via INTRAVENOUS
  Administered 2017-04-07: 5 mg via INTRAVENOUS
  Administered 2017-04-07: 10 mg via INTRAVENOUS

## 2017-04-07 MED ORDER — OXYCODONE-ACETAMINOPHEN 5-325 MG PO TABS
1.0000 | ORAL_TABLET | ORAL | Status: DC | PRN
  Administered 2017-04-07 – 2017-04-08 (×4): 2 via ORAL
  Filled 2017-04-07 (×4): qty 2

## 2017-04-07 MED ORDER — SODIUM CHLORIDE 0.9% FLUSH
3.0000 mL | Freq: Two times a day (BID) | INTRAVENOUS | Status: DC
  Administered 2017-04-07 (×2): 3 mL via INTRAVENOUS

## 2017-04-07 MED ORDER — DULOXETINE HCL 30 MG PO CPEP
60.0000 mg | ORAL_CAPSULE | Freq: Every day | ORAL | Status: DC
  Administered 2017-04-08: 60 mg via ORAL
  Filled 2017-04-07: qty 2

## 2017-04-07 MED ORDER — LACTATED RINGERS IV SOLN
INTRAVENOUS | Status: DC | PRN
  Administered 2017-04-07 (×2): via INTRAVENOUS

## 2017-04-07 MED ORDER — POVIDONE-IODINE 7.5 % EX SOLN: Freq: Once | CUTANEOUS | Status: DC

## 2017-04-07 MED ORDER — ALUM & MAG HYDROXIDE-SIMETH 200-200-20 MG/5ML PO SUSP: 30.0000 mL | Freq: Four times a day (QID) | ORAL | Status: DC | PRN

## 2017-04-07 MED ORDER — CEFAZOLIN SODIUM-DEXTROSE 2-4 GM/100ML-% IV SOLN
2.0000 g | INTRAVENOUS | Status: AC
  Administered 2017-04-07: 2 g via INTRAVENOUS
  Filled 2017-04-07: qty 100

## 2017-04-07 MED ORDER — MORPHINE SULFATE (PF) 2 MG/ML IV SOLN: 1.0000 mg | INTRAVENOUS | Status: DC | PRN

## 2017-04-07 MED ORDER — METFORMIN HCL 500 MG PO TABS
1000.0000 mg | ORAL_TABLET | Freq: Two times a day (BID) | ORAL | Status: DC
  Administered 2017-04-07 – 2017-04-08 (×2): 1000 mg via ORAL
  Filled 2017-04-07 (×2): qty 2

## 2017-04-07 MED ORDER — PHENOL 1.4 % MT LIQD
1.0000 | OROMUCOSAL | Status: DC | PRN
  Administered 2017-04-07: 1 via OROMUCOSAL
  Filled 2017-04-07: qty 177

## 2017-04-07 MED ORDER — MEPERIDINE HCL 25 MG/ML IJ SOLN: 6.2500 mg | INTRAMUSCULAR | Status: DC | PRN

## 2017-04-07 MED ORDER — ACETAMINOPHEN 325 MG PO TABS: 650.0000 mg | ORAL_TABLET | ORAL | Status: DC | PRN

## 2017-04-07 MED ORDER — NITROGLYCERIN 0.4 MG SL SUBL: 0.4000 mg | SUBLINGUAL_TABLET | SUBLINGUAL | Status: DC | PRN

## 2017-04-07 MED ORDER — BUPIVACAINE LIPOSOME 1.3 % IJ SUSP
20.0000 mL | Freq: Once | INTRAMUSCULAR | Status: DC
  Filled 2017-04-07: qty 20

## 2017-04-07 MED ORDER — ATORVASTATIN CALCIUM 20 MG PO TABS
40.0000 mg | ORAL_TABLET | Freq: Every day | ORAL | Status: DC
  Administered 2017-04-07 – 2017-04-08 (×2): 40 mg via ORAL
  Filled 2017-04-07 (×2): qty 2

## 2017-04-07 MED ORDER — CARVEDILOL 6.25 MG PO TABS
6.2500 mg | ORAL_TABLET | Freq: Two times a day (BID) | ORAL | Status: DC
  Administered 2017-04-07 – 2017-04-08 (×2): 6.25 mg via ORAL
  Filled 2017-04-07 (×2): qty 1

## 2017-04-07 MED ORDER — FENTANYL CITRATE (PF) 100 MCG/2ML IJ SOLN
INTRAMUSCULAR | Status: AC
  Filled 2017-04-07: qty 2

## 2017-04-07 MED ORDER — 0.9 % SODIUM CHLORIDE (POUR BTL) OPTIME
TOPICAL | Status: DC | PRN
  Administered 2017-04-07 (×2): 1000 mL

## 2017-04-07 MED ORDER — MORPHINE SULFATE (PF) 4 MG/ML IV SOLN
1.0000 mg | INTRAVENOUS | Status: DC | PRN
  Administered 2017-04-07: 2 mg via INTRAVENOUS
  Filled 2017-04-07: qty 1

## 2017-04-07 MED ORDER — PROPOFOL 10 MG/ML IV BOLUS
INTRAVENOUS | Status: DC | PRN
  Administered 2017-04-07: 200 mg via INTRAVENOUS

## 2017-04-07 MED ORDER — BISACODYL 5 MG PO TBEC: 5.0000 mg | DELAYED_RELEASE_TABLET | Freq: Every day | ORAL | Status: DC | PRN

## 2017-04-07 MED ORDER — FENTANYL CITRATE (PF) 100 MCG/2ML IJ SOLN
25.0000 ug | INTRAMUSCULAR | Status: DC | PRN
Start: 2017-04-07 — End: 2017-04-07
  Administered 2017-04-07: 50 ug via INTRAVENOUS

## 2017-04-07 MED ORDER — DOCUSATE SODIUM 100 MG PO CAPS
100.0000 mg | ORAL_CAPSULE | Freq: Two times a day (BID) | ORAL | Status: DC
  Administered 2017-04-07 – 2017-04-08 (×3): 100 mg via ORAL
  Filled 2017-04-07 (×3): qty 1

## 2017-04-07 MED ORDER — SODIUM CHLORIDE 0.9% FLUSH: 3.0000 mL | INTRAVENOUS | Status: DC | PRN

## 2017-04-07 MED ORDER — PANTOPRAZOLE SODIUM 40 MG IV SOLR
40.0000 mg | Freq: Every day | INTRAVENOUS | Status: DC
  Administered 2017-04-07: 40 mg via INTRAVENOUS
  Filled 2017-04-07: qty 40

## 2017-04-07 MED ORDER — SUGAMMADEX SODIUM 200 MG/2ML IV SOLN
INTRAVENOUS | Status: DC | PRN
  Administered 2017-04-07: 200 mg via INTRAVENOUS

## 2017-04-07 MED ORDER — LOSARTAN POTASSIUM 50 MG PO TABS
50.0000 mg | ORAL_TABLET | Freq: Two times a day (BID) | ORAL | Status: DC
  Administered 2017-04-07 – 2017-04-08 (×2): 50 mg via ORAL
  Filled 2017-04-07 (×2): qty 1

## 2017-04-07 MED ORDER — MIDAZOLAM HCL 5 MG/5ML IJ SOLN
INTRAMUSCULAR | Status: DC | PRN
  Administered 2017-04-07: 2 mg via INTRAVENOUS

## 2017-04-07 MED ORDER — ONDANSETRON HCL 4 MG PO TABS: 4.0000 mg | ORAL_TABLET | Freq: Four times a day (QID) | ORAL | Status: DC | PRN

## 2017-04-07 MED ORDER — LACTATED RINGERS IV SOLN: INTRAVENOUS | Status: DC

## 2017-04-07 MED ORDER — FLEET ENEMA 7-19 GM/118ML RE ENEM: 1.0000 | ENEMA | Freq: Once | RECTAL | Status: DC | PRN

## 2017-04-07 MED ORDER — LIDOCAINE HCL (CARDIAC) 20 MG/ML IV SOLN
INTRAVENOUS | Status: DC | PRN
  Administered 2017-04-07: 100 mg via INTRAVENOUS

## 2017-04-07 MED ORDER — LIDOCAINE 2% (20 MG/ML) 5 ML SYRINGE
INTRAMUSCULAR | Status: AC
  Filled 2017-04-07: qty 10

## 2017-04-07 MED ORDER — ONDANSETRON HCL 4 MG/2ML IJ SOLN
INTRAMUSCULAR | Status: DC | PRN
  Administered 2017-04-07: 4 mg via INTRAVENOUS

## 2017-04-07 MED ORDER — BUPIVACAINE-EPINEPHRINE 0.25% -1:200000 IJ SOLN
INTRAMUSCULAR | Status: AC
  Filled 2017-04-07: qty 1

## 2017-04-07 MED ORDER — PHENYLEPHRINE 40 MCG/ML (10ML) SYRINGE FOR IV PUSH (FOR BLOOD PRESSURE SUPPORT)
PREFILLED_SYRINGE | INTRAVENOUS | Status: AC
  Filled 2017-04-07: qty 10

## 2017-04-07 SURGICAL SUPPLY — 78 items
APL SKNCLS STERI-STRIP NONHPOA (GAUZE/BANDAGES/DRESSINGS) ×1
BENZOIN TINCTURE PRP APPL 2/3 (GAUZE/BANDAGES/DRESSINGS) ×2 IMPLANT
BIT DRILL NEURO 2X3.1 SFT TUCH (MISCELLANEOUS) ×1 IMPLANT
BIT DRILL SRG 14X2.2XFLT CHK (BIT) IMPLANT
BIT DRL SRG 14X2.2XFLT CHK (BIT) ×1
BLADE CLIPPER SURG (BLADE) ×2 IMPLANT
BLADE SURG 15 STRL LF DISP TIS (BLADE) ×1 IMPLANT
BLADE SURG 15 STRL SS (BLADE) ×2
BONE VIVIGEN FORMABLE 1.3CC (Bone Implant) ×2 IMPLANT
BUR MATCHSTICK NEURO 3.0 LAGG (BURR) ×1 IMPLANT
CARTRIDGE OIL MAESTRO DRILL (MISCELLANEOUS) ×1 IMPLANT
CLSR STERI-STRIP ANTIMIC 1/2X4 (GAUZE/BANDAGES/DRESSINGS) ×1 IMPLANT
CORDS BIPOLAR (ELECTRODE) ×2 IMPLANT
COVER SURGICAL LIGHT HANDLE (MISCELLANEOUS) ×2 IMPLANT
CRADLE DONUT ADULT HEAD (MISCELLANEOUS) ×2 IMPLANT
DIFFUSER DRILL AIR PNEUMATIC (MISCELLANEOUS) ×2 IMPLANT
DRAIN JACKSON RD 7FR 3/32 (WOUND CARE) IMPLANT
DRAPE C-ARM 42X72 X-RAY (DRAPES) ×2 IMPLANT
DRAPE POUCH INSTRU U-SHP 10X18 (DRAPES) ×2 IMPLANT
DRAPE SURG 17X23 STRL (DRAPES) ×6 IMPLANT
DRILL BIT SKYLINE 14MM (BIT) ×2
DRILL NEURO 2X3.1 SOFT TOUCH (MISCELLANEOUS) ×2
DURAPREP 26ML APPLICATOR (WOUND CARE) ×2 IMPLANT
ELECT COATED BLADE 2.86 ST (ELECTRODE) ×2 IMPLANT
ELECT REM PT RETURN 9FT ADLT (ELECTROSURGICAL) ×2
ELECTRODE REM PT RTRN 9FT ADLT (ELECTROSURGICAL) ×1 IMPLANT
EVACUATOR SILICONE 100CC (DRAIN) IMPLANT
GAUZE SPONGE 4X4 12PLY STRL (GAUZE/BANDAGES/DRESSINGS) ×1 IMPLANT
GAUZE SPONGE 4X4 12PLY STRL LF (GAUZE/BANDAGES/DRESSINGS) ×1 IMPLANT
GAUZE SPONGE 4X4 16PLY XRAY LF (GAUZE/BANDAGES/DRESSINGS) ×2 IMPLANT
GLOVE BIO SURGEON STRL SZ7 (GLOVE) ×2 IMPLANT
GLOVE BIO SURGEON STRL SZ8 (GLOVE) ×2 IMPLANT
GLOVE BIOGEL PI IND STRL 7.0 (GLOVE) ×2 IMPLANT
GLOVE BIOGEL PI IND STRL 8 (GLOVE) ×1 IMPLANT
GLOVE BIOGEL PI INDICATOR 7.0 (GLOVE) ×2
GLOVE BIOGEL PI INDICATOR 8 (GLOVE) ×1
GOWN STRL REUS W/ TWL LRG LVL3 (GOWN DISPOSABLE) ×1 IMPLANT
GOWN STRL REUS W/ TWL XL LVL3 (GOWN DISPOSABLE) ×1 IMPLANT
GOWN STRL REUS W/TWL LRG LVL3 (GOWN DISPOSABLE) ×2
GOWN STRL REUS W/TWL XL LVL3 (GOWN DISPOSABLE) ×2
GRAFT BNE MATRIX VG FRMBL SM 1 (Bone Implant) IMPLANT
INTERLOCK LRDTC CRVCL VBR 6MM (Bone Implant) IMPLANT
IV CATH 14GX2 1/4 (CATHETERS) ×2 IMPLANT
KIT BASIN OR (CUSTOM PROCEDURE TRAY) ×2 IMPLANT
KIT ROOM TURNOVER OR (KITS) ×2 IMPLANT
LORDOTIC CERVICAL VBR 6MM SM (Bone Implant) ×2 IMPLANT
MANIFOLD NEPTUNE II (INSTRUMENTS) ×2 IMPLANT
NDL PRECISIONGLIDE 27X1.5 (NEEDLE) ×1 IMPLANT
NDL SPNL 20GX3.5 QUINCKE YW (NEEDLE) ×1 IMPLANT
NEEDLE PRECISIONGLIDE 27X1.5 (NEEDLE) ×2 IMPLANT
NEEDLE SPNL 20GX3.5 QUINCKE YW (NEEDLE) ×2 IMPLANT
NS IRRIG 1000ML POUR BTL (IV SOLUTION) ×2 IMPLANT
OIL CARTRIDGE MAESTRO DRILL (MISCELLANEOUS) ×2
PACK ORTHO CERVICAL (CUSTOM PROCEDURE TRAY) ×2 IMPLANT
PAD ARMBOARD 7.5X6 YLW CONV (MISCELLANEOUS) ×4 IMPLANT
PATTIES SURGICAL .5 X.5 (GAUZE/BANDAGES/DRESSINGS) IMPLANT
PATTIES SURGICAL .5 X1 (DISPOSABLE) ×1 IMPLANT
PIN DISTRACTION 14 (PIN) ×2 IMPLANT
PLATE SKYLINE 12MM (Plate) ×1 IMPLANT
SCREW SKYLINE VAR OS 14MM (Screw) ×4 IMPLANT
SLEEVE SURGEON STRL (DRAPES) ×1 IMPLANT
SPONGE INTESTINAL PEANUT (DISPOSABLE) ×3 IMPLANT
SPONGE SURGIFOAM ABS GEL 100 (HEMOSTASIS) ×1 IMPLANT
STRIP CLOSURE SKIN 1/2X4 (GAUZE/BANDAGES/DRESSINGS) ×2 IMPLANT
SURGIFLO W/THROMBIN 8M KIT (HEMOSTASIS) IMPLANT
SUT MNCRL AB 4-0 PS2 18 (SUTURE) IMPLANT
SUT SILK 4 0 (SUTURE)
SUT SILK 4-0 18XBRD TIE 12 (SUTURE) IMPLANT
SUT VIC AB 2-0 CT2 18 VCP726D (SUTURE) ×2 IMPLANT
SYR BULB IRRIGATION 50ML (SYRINGE) ×2 IMPLANT
SYR CONTROL 10ML LL (SYRINGE) ×4 IMPLANT
TAPE CLOTH 4X10 WHT NS (GAUZE/BANDAGES/DRESSINGS) ×2 IMPLANT
TAPE CLOTH SURG 4X10 WHT LF (GAUZE/BANDAGES/DRESSINGS) ×1 IMPLANT
TAPE UMBILICAL COTTON 1/8X30 (MISCELLANEOUS) ×2 IMPLANT
TOWEL OR 17X24 6PK STRL BLUE (TOWEL DISPOSABLE) ×2 IMPLANT
TOWEL OR 17X26 10 PK STRL BLUE (TOWEL DISPOSABLE) ×2 IMPLANT
WATER STERILE IRR 1000ML POUR (IV SOLUTION) ×2 IMPLANT
YANKAUER SUCT BULB TIP NO VENT (SUCTIONS) ×2 IMPLANT

## 2017-04-07 NOTE — H&P (Signed)
PREOPERATIVE H&P  Chief Complaint: Bilateral arm pain  HPI: Tony Manning is a 56 y.o. 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 Patient is s/p a previous ACDF C5-7  Patient has failed multiple forms of conservative care and continues to have pain (see office notes for additional details regarding the patient's full course of treatment)  Past Medical History:  Diagnosis Date  . Anginal pain ([place])   . Basal cell carcinoma of face   . Chest pain, unspecified   . Coronary atherosclerosis of native coronary artery    s/p stent x4  . Depression   . Diabetes mellitus without mention of complication   . Diverticulosis   . Dyspnea   . Dysrhythmia   . Erectile dysfunction   . Essential hypertension, benign   . GERD (gastroesophageal reflux disease)   . Heart murmur   . History of kidney stones   . History of nephrolithiasis   . Migraine   . Myocardial infarction ([place])   . Neuromuscular disorder ([place])   . Osteoarthritis   . Pneumonia   . Postsurgical percutaneous transluminal coronary angioplasty status    s/p stent x4  . Sarcoidosis   . Unspecified sleep apnea    Past Surgical History:  Procedure Laterality Date  . APPENDECTOMY  2005  . CARPAL TUNNEL RELEASE    . CERVICAL FUSION  2009  . CORONARY ANGIOPLASTY WITH STENT PLACEMENT    . CORONARY STENT INTERVENTION N/A 10/16/2016   Procedure: Coronary Stent Intervention;  Surgeon: Adrian Prows, MD;  Location: [facility];  Service: Cardiovascular;  Laterality: N/A;  . ELBOW SURGERY     bilateral  . KNEE SURGERY     Bilateral   . LEFT HEART CATH AND CORONARY ANGIOGRAPHY N/A 10/16/2016   Procedure: Left Heart Cath and Coronary Angiography;  Surgeon: Adrian Prows, MD;  Location: [facility];  Service: Cardiovascular;  Laterality: N/A;  . LEFT HEART CATHETERIZATION WITH CORONARY ANGIOGRAM N/A 06/14/2014   Procedure: LEFT HEART CATHETERIZATION WITH CORONARY  ANGIOGRAM;  Surgeon: Laverda Page, MD;  Location: [facility] CATH [facility];  Service: Cardiovascular;  Laterality: N/A;  . LUNG BIOPSY     sarcoid  . ROTATOR CUFF REPAIR     left   Social History   Social History  . Marital status: Married    Spouse name: N/A  . Number of children: 2  . Years of education: N/A   Occupational History  . disabled Unemployed   Social History Main Topics  . Smoking status: Never Smoker  . Smokeless tobacco: Former Systems developer    Types: [place] date: 02/06/2016     Comment: trying to quit  . Alcohol use No  . Drug use: No  . Sexual activity: Yes    Birth control/ protection: Condom   Other Topics Concern  . None   Social History Narrative   Married, 1987   2 kids   On disability   Family History  Problem Relation Age of Onset  . Arthritis Mother   . Depression Mother   . Stroke Mother   . Arthritis Father   . Diabetes Father   . Coronary artery disease Father   . Colon cancer Neg Hx   . Prostate cancer Neg Hx   . Esophageal cancer Neg Hx   . Rectal cancer Neg Hx   . Stomach cancer Neg Hx  Allergies  Allergen Reactions  . Ambien [Zolpidem] Other (See Comments)    Parasomnias, sleep walking  . Lisinopril Cough  . Tramadol     diarrhea   Prior to Admission medications   Medication Sig Start Date End Date Taking? Authorizing Provider  aspirin 81 MG chewable tablet Chew 81 mg by mouth at bedtime.   Yes [provider]  atorvastatin (LIPITOR) 40 MG tablet Take 1 tablet (40 mg total) by mouth daily. 09/08/16  Yes Tonia Ghent, MD  carvedilol (COREG) 6.25 MG tablet Take 6.25 mg by mouth 2 (two) times daily with a meal.   Yes [provider]  clopidogrel (PLAVIX) 75 MG tablet Take 75 mg by mouth at bedtime.    Yes [provider]  DULoxetine (CYMBALTA) 60 MG capsule Take 1 capsule (60 mg total) by mouth daily. 12/02/16  Yes Tonia Ghent, MD  glimepiride (AMARYL) 4 MG tablet TAKE 1 TABLET BY MOUTH TWICE A DAY  03/12/17  Yes Tonia Ghent, MD  ibuprofen (ADVIL,MOTRIN) 200 MG tablet Take 200-400 mg by mouth every 8 (eight) hours as needed (for pain.).   Yes [provider]  Insulin Detemir (LEVEMIR) 100 UNIT/ML Pen Inject 36 Units into the skin every morning. If AM sugar >120, add a unit. If AM sugar <80, decrease a unit.  If 80-120, then no change in dose Patient taking differently: Inject 30-32 Units into the skin every morning. If AM sugar >120, add a unit. If AM sugar <80, decrease a unit.  If 80-120, then no change in dose 03/16/17  Yes Tonia Ghent, MD  losartan (COZAAR) 100 MG tablet Take 0.5 tablets (50 mg total) by mouth 2 (two) times daily. 10/20/16  Yes Tonia Ghent, MD  metFORMIN (GLUCOPHAGE) 1000 MG tablet TAKE 1 TABLET BY MOUTH TWICE A DAY WITH A MEAL 10/18/16  Yes Adrian Prows, MD  oxyCODONE-acetaminophen (ROXICET) 5-325 MG tablet Take 1 tablet by mouth every 8 (eight) hours as needed for severe pain. 03/16/17  Yes Tonia Ghent, MD  ranitidine (ZANTAC) 75 MG tablet Take 75 mg by mouth daily.   Yes [provider]  tamsulosin (FLOMAX) 0.4 MG CAPS capsule TAKE 1 CAPSULE (0.4 MG TOTAL) BY MOUTH DAILY. 02/15/17  Yes Tonia Ghent, MD  glucose blood (ONE TOUCH ULTRA TEST) test strip Check sugar up to 6 times a day.  DM2, insulin treated, uncontrolled. 07/01/14   Tonia Ghent, MD  Insulin Pen Needle 31G X 4 MM MISC 1 Device by Does not apply route daily. Use daily with insulin needle 05/12/16   Tonia Ghent, MD  nitroGLYCERIN (NITROSTAT) 0.4 MG SL tablet Place 1 tablet (0.4 mg total) under the tongue every 5 (five) minutes as needed for chest pain. 05/15/15   Tonia Ghent, MD     All other systems have been reviewed and were otherwise negative with the exception of those mentioned in the HPI and as above.  Physical Exam: Vitals:   04/07/17 0740  BP: 119/66  Pulse: 64  Resp: 20  Temp: 97.7 F (36.5 C)    General: Alert, no acute distress Cardiovascular:  No pedal edema Respiratory: No cyanosis, no use of accessory musculature Skin: No lesions in the area of chief complaint Neurologic: Sensation intact distally Psychiatric: Patient is competent for consent with normal mood and affect Lymphatic: No axillary or cervical lymphadenopathy  MUSCULOSKELETAL: + spurling on the right   Assessment/Plan: Bilateral arm pain Plan for Procedure(s): ANTERIOR  CERVICAL DECOMPRESSION FUSION, CERVICAL 4-5 WITH INSTRUMENTATION AND ALLOGRAFT WITH REMOVAL OF C5-7 PLATE   Sinclair Ship, MD 04/07/2017 8:04 AM

## 2017-04-07 NOTE — Progress Notes (Signed)
Orthopedic Tech Progress Note Patient Details:  Tony Manning [facility] [birth year] [id number]  Ortho Devices Type of Ortho Device: [place] cervical collar Ortho Device/Splint Location: neck Ortho Device/Splint Interventions: Loanne Drilling, Zorion Nims 04/07/2017, 3:27 PM Viewed order from doctor's order list

## 2017-04-07 NOTE — Anesthesia Procedure Notes (Signed)
Procedure Name: Intubation Date/Time: 04/07/2017 11:14 AM Performed by: Neldon Newport Pre-anesthesia Checklist: Patient identified, Emergency Drugs available, Suction available and Patient being monitored Patient Re-evaluated:Patient Re-evaluated prior to induction Oxygen Delivery Method: Circle system utilized Preoxygenation: Pre-oxygenation with 100% oxygen Induction Type: IV induction Ventilation: Oral airway inserted - appropriate to patient size and Two handed mask ventilation required Laryngoscope Size: Mac and 3 Grade View: Grade I Tube size: 7.5 mm Number of attempts: 1 Airway Equipment and Method: Stylet Placement Confirmation: ETT inserted through vocal cords under direct vision,  positive ETCO2 and breath sounds checked- equal and bilateral Secured at: 23 cm Tube secured with: Tape Dental Injury: Teeth and Oropharynx as per pre-operative assessment

## 2017-04-07 NOTE — Transfer of Care (Signed)
Immediate Anesthesia Transfer of Care Note  Patient: Tony Manning  Procedure(s) Performed: Procedure(s) with comments: ANTERIOR CERVICAL DECOMPRESSION FUSION, CERVICAL 4-5 WITH INSTRUMENTATION AND ALLOGRAFT (N/A) - ANTERIOR CERVICAL DECOMPRESSION FUSION, CERVICAL 4-5 WITH INSTRUMENTATION AND ALLOGRAFT; REQUEST 2.5 HOURS AND FLIP ROOM  Patient Location: PACU  Anesthesia Type:General  Level of Consciousness: sedated  Airway & Oxygen Therapy: Patient Spontanous Breathing and Patient connected to face mask oxygen  Post-op Assessment: Report given to RN, Post -op Vital signs reviewed and stable and Patient moving all extremities X 4  Post vital signs: Reviewed and stable  Last Vitals:  Vitals:   04/07/17 0740  BP: 119/66  Pulse: 64  Resp: 20  Temp: 36.5 C    Last Pain:  Vitals:   04/07/17 0740  TempSrc: Oral         Complications: No apparent anesthesia complications

## 2017-04-07 NOTE — Anesthesia Preprocedure Evaluation (Addendum)
Anesthesia Evaluation  Patient identified by MRN, date of birth, ID band Patient awake    Reviewed: Allergy & Precautions, NPO status , Patient's Chart, lab work & pertinent test results  Airway Mallampati: I  TM Distance: >3 FB Neck ROM: Full    Dental no notable dental hx. (+) Teeth Intact, Dental Advidsory Given   Pulmonary sleep apnea and Continuous Positive Airway Pressure Ventilation ,    Pulmonary exam normal breath sounds clear to auscultation       Cardiovascular hypertension, Pt. on medications + CAD, + Past MI and + Cardiac Stents  Normal cardiovascular exam Rhythm:Regular Rate:Normal     Neuro/Psych  Headaches,  Neuromuscular disease negative neurological ROS  negative psych ROS   GI/Hepatic negative GI ROS, Neg liver ROS, GERD  Medicated and Controlled,  Endo/Other  diabetes  Renal/GU negative Renal ROS  negative genitourinary   Musculoskeletal negative musculoskeletal ROS (+)   Abdominal   Peds negative pediatric ROS (+)  Hematology negative hematology ROS (+)   Anesthesia Other Findings   Reproductive/Obstetrics negative OB ROS                           Anesthesia Physical Anesthesia Plan  ASA: III  Anesthesia Plan: General   Post-op Pain Management:    Induction: Intravenous  PONV Risk Score and Plan:   Airway Management Planned: Oral ETT  Additional Equipment:   Intra-op Plan:   Post-operative Plan: Extubation in OR  Informed Consent: I have reviewed the patients History and Physical, chart, labs and discussed the procedure including the risks, benefits and alternatives for the proposed anesthesia with the patient or authorized representative who has indicated his/her understanding and acceptance.   Dental advisory given  Plan Discussed with: CRNA  Anesthesia Plan Comments:         Anesthesia Quick Evaluation

## 2017-04-08 DIAGNOSIS — M4802 Spinal stenosis, cervical region: Secondary | ICD-10-CM | POA: Diagnosis not present

## 2017-04-08 LAB — GLUCOSE, CAPILLARY: Glucose-Capillary: 119 mg/dL — ABNORMAL HIGH (ref 65–99)

## 2017-04-08 MED FILL — Thrombin For Soln 20000 Unit: CUTANEOUS | Qty: 1 | Status: AC

## 2017-04-08 NOTE — Progress Notes (Signed)
    Patient doing well Patient denies R or L arm pain Has been eating and drinking   Physical Exam: Vitals:   04/08/17 0000 04/08/17 0400  BP: 125/77 120/77  Pulse: 78 69  Resp: 18 20  Temp: 98.5 F (36.9 C) 98 F (36.7 C)    Dressing in place NVI Neck soft supple  POD #1 s/p C4/5 ACDF procedure, doing well with resolved bilateral arm pain  - encourage ambulation - Percocet for pain, Valium for muscle spasms - d/c home today with f/u in 2 weeks

## 2017-04-08 NOTE — Anesthesia Postprocedure Evaluation (Signed)
Anesthesia Post Note  Patient: Tony Manning  Procedure(s) Performed: Procedure(s) (LRB): ANTERIOR CERVICAL DECOMPRESSION FUSION, CERVICAL 4-5 WITH INSTRUMENTATION AND ALLOGRAFT (N/A)     Patient location during evaluation: PACU Anesthesia Type: General Level of consciousness: awake and alert Pain management: pain level controlled Vital Signs Assessment: post-procedure vital signs reviewed and stable Respiratory status: spontaneous breathing, nonlabored ventilation, respiratory function stable and patient connected to nasal cannula oxygen Cardiovascular status: blood pressure returned to baseline and stable Postop Assessment: no signs of nausea or vomiting Anesthetic complications: no    Last Vitals:  Vitals:   04/08/17 0400 04/08/17 0730  BP: 120/77 134/75  Pulse: 69 64  Resp: 20 18  Temp: 36.7 C 36.9 C    Last Pain:  Vitals:   04/08/17 0730  TempSrc: Oral  PainSc:    Pain Goal: Patients Stated Pain Goal: 2 (04/08/17 0550)               Montez Hageman

## 2017-04-08 NOTE — Progress Notes (Signed)
Patient alert and oriented, mae's well, voiding adequate amount of urine, swallowing without difficulty, no c/o pain at time of discharge. Patient discharged home with family. Script and discharged instructions given to patient. Patient and family stated understanding of instructions given. Patient has an appointment with Dr. Dumonski 

## 2017-04-08 NOTE — Care Management Obs Status (Signed)
[facility] NOTIFICATION   Patient Details  Name: Tony Manning MRN: [id number] Date of Birth: May 29, 1961   Medicare Observation Status Notification Given:  Yes    Ninfa Meeker, RN 04/08/2017, 9:53 AM

## 2017-04-08 NOTE — Op Note (Signed)
Tony Manning, Tony Manning NO.:  [id number]  MEDICAL RECORD NO.:  [id number]  PHYSICIAN:  Phylliss Bob, MD      DATE OF BIRTH:  [birth year]  DATE OF PROCEDURE:  04/07/2017                              OPERATIVE REPORT   PREOPERATIVE DIAGNOSES: 1. 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 2. Bilateral cervical radiculopathy. 3. Status post previous C5-C7 anterior cervical decompression and     fusion procedure in 2009 by another provider.  POSTOPERATIVE DIAGNOSES: 1. 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 2. Bilateral cervical radiculopathy. 3. Status post previous C5-C7 anterior cervical decompression and     fusion procedure in 2009 by another provider.  PROCEDURE: 1. Anterior cervical decompression and fusion, C4-5. 2. Placement of anterior instrumentation, C4-5. 3. Insertion of interbody device x1 (Titan intervertebral spacer). 4. Removal of anterior instrumentation (C5-C7). 5. Use of morselized allograft - ViviGen. 6. Intraoperative use of fluoroscopy.  SURGEON:  Phylliss Bob, MD.  ASSISTANTPricilla Holm, PA-C.  ANESTHESIA:  General endotracheal anesthesia.  COMPLICATIONS:  None.  DISPOSITION:  Stable.  ESTIMATED BLOOD LOSS:  Minimal.  INDICATIONS FOR SURGERY:  Briefly, Mr. Bellanca is a pleasant 56 year old male, who did present to me with bilateral arm pain, right greater than left.  The patient's MRI did reveal the findings outlined above.  Given the patient's ongoing pain and dysfunction, we did discuss proceeding with the procedure reflected above.  The patient was fully aware of the risks and limitations of surgery, and did elect to proceed with surgery.  DESCRIPTION OF PROCEDURE:  On April 07, 2017, the patient was brought to surgery and general endotracheal anesthesia was administered.  The patient was placed supine on the  hospital bed with his neck gently extended.  The neck was then prepped and draped in the usual sterile fashion.  A right-sided transverse incision was made overlying the C4-5 intervertebral space.  The platysma was incised and a Smith-Robinson approach was used and the anterior spine was noted.  The previously placed plate from E8-B1 was noted.  There was extensive osteophytes noted particularly at the upper aspect of the plate.  I was able to remove the osteophytes using a series of rongeurs and osteotomes.  I then removed the vertebral body screws at C5, C6, and C7 bilaterally. The plate was then uneventfully removed.  I then placed a self-retaining retractor, and I then placed Caspar pins into the C4 and C5 vertebral bodies and distraction was applied.  I then proceeded with a thorough and complete C4-5 intervertebral diskectomy.  I was very pleased with the diskectomy that I was able to accomplish.  I then proceeded with decompressing the spinal canal in the right and left neural foramina. The endplates were then prepared and the appropriate size interbody spacer was packed with ViviGen and tamped into position in the usual fashion.  The Caspar pins were then removed.  A 12 mm anterior cervical plate was then placed over the anterior spine.  A 14 mm variable angle screws were placed, 2 in each vertebral body at C4 and C5 for a total of 4 screws.  The screws were then locked to the plate.  I was very pleased with the  final fluoroscopic images.  The wound was then copiously irrigated.  The wound was then closed in layers using 2-0 Vicryl, followed by 4-0 Monocryl.  Benzoin and Steri-Strips were then applied, followed by sterile dressing.  All instrument counts were correct.  The patient was then awoken from general endotracheal anesthesia and transferred to recovery.  Of note, Pricilla Holm, was my assistant throughout surgery, and did aid in retraction, suctioning, and closure from  start to finish.     Phylliss Bob, MD   ______________________________ Phylliss Bob, MD    MD/MEDQ  D:  04/07/2017  T:  04/07/2017  Job:  [id number]

## 2017-04-09 ENCOUNTER — Encounter (HOSPITAL_COMMUNITY): Payer: Self-pay | Admitting: Orthopedic Surgery

## 2017-04-14 NOTE — Discharge Summary (Signed)
Patient ID: KALYB PEMBLE MRN: [id number] DOB/AGE: Jul 14, 1961 56 y.o.  Admit date: 04/07/2017 Discharge date: 04/08/2016  Admission Diagnoses:  Active Problems:   Radiculopathy   Discharge Diagnoses:  Same  Past Medical History:  Diagnosis Date  . Anginal pain ([place])   . Basal cell carcinoma of face   . Chest pain, unspecified   . Coronary atherosclerosis of native coronary artery    s/p stent x4  . Depression   . Diabetes mellitus without mention of complication   . Diverticulosis   . Dyspnea   . Dysrhythmia   . Erectile dysfunction   . Essential hypertension, benign   . GERD (gastroesophageal reflux disease)   . Heart murmur   . History of kidney stones   . History of nephrolithiasis   . Migraine   . Myocardial infarction ([place])   . Neuromuscular disorder ([place])   . Osteoarthritis   . Pneumonia   . Postsurgical percutaneous transluminal coronary angioplasty status    s/p stent x4  . Sarcoidosis   . Unspecified sleep apnea     Surgeries: Procedure(s): ANTERIOR CERVICAL DECOMPRESSION FUSION, CERVICAL 4-5 WITH INSTRUMENTATION AND ALLOGRAFT on 04/07/2017   Consultants: None  Discharged Condition: Improved  Hospital Course: DEMAREE LIBERTO is an 56 y.o. male who was admitted 04/07/2017 for operative treatment of myeloradiculopathy. Patient has severe unremitting pain that affects sleep, daily activities, and work/hobbies. After pre-op clearance the patient was taken to the operating room on 04/07/2017 and underwent  Procedure(s): ANTERIOR CERVICAL DECOMPRESSION FUSION, CERVICAL 4-5 [place].    Patient was given perioperative antibiotics:  Anti-infectives    Start     Dose/Rate Route Frequency Ordered Stop   04/07/17 1530  ceFAZolin (ANCEF) IVPB 2g/100 mL premix     2 g 200 mL/hr over 30 Minutes Intravenous Every 8 hours 04/07/17 1519 04/07/17 2307   04/07/17 0728  ceFAZolin (ANCEF) IVPB 2g/100 mL premix     2 g 200 mL/hr over 30  Minutes Intravenous On call to O.R. 04/07/17 0728 04/07/17 1105       Patient was given sequential compression devices, early ambulation to prevent DVT.  Patient benefited maximally from hospital stay and there were no complications.    Recent vital signs: BP 134/75 (BP Location: Right Arm)   Pulse 64   Temp 98.4 F (36.9 C) (Oral)   Resp 18   Wt 97.5 kg (215 lb)   SpO2 97%   BMI 30.41 kg/m     Discharge Medications:   Allergies as of 04/08/2017      Reactions   Ambien [zolpidem] Other (See Comments)   Parasomnias, sleep walking   Lisinopril Cough   Tramadol    diarrhea      Medication List    STOP taking these medications   clopidogrel 75 MG tablet Commonly known as:  PLAVIX     TAKE these medications   aspirin 81 MG chewable tablet Chew 81 mg by mouth at bedtime.   atorvastatin 40 MG tablet Commonly known as:  LIPITOR Take 1 tablet (40 mg total) by mouth daily.   carvedilol 6.25 MG tablet Commonly known as:  COREG Take 6.25 mg by mouth 2 (two) times daily with a meal.   DULoxetine 60 MG capsule Commonly known as:  CYMBALTA Take 1 capsule (60 mg total) by mouth daily.   glimepiride 4 MG tablet Commonly known as:  AMARYL TAKE 1 TABLET BY MOUTH TWICE A DAY   glucose blood  test strip Commonly known as:  ONE TOUCH ULTRA TEST Check sugar up to 6 times a day.  DM2, insulin treated, uncontrolled.   Insulin Detemir 100 UNIT/ML Pen Commonly known as:  LEVEMIR Inject 36 Units into the skin every morning. If AM sugar >120, add a unit. If AM sugar <80, decrease a unit.  If 80-120, then no change in dose What changed:  how much to take  additional instructions   Insulin Pen Needle 31G X 4 MM Misc 1 Device by Does not apply route daily. Use daily with insulin needle   losartan 100 MG tablet Commonly known as:  COZAAR Take 0.5 tablets (50 mg total) by mouth 2 (two) times daily.   metFORMIN 1000 MG tablet Commonly known as:  GLUCOPHAGE TAKE 1 TABLET BY  MOUTH TWICE A DAY WITH A MEAL   nitroGLYCERIN 0.4 MG SL tablet Commonly known as:  NITROSTAT Place 1 tablet (0.4 mg total) under the tongue every 5 (five) minutes as needed for chest pain.   oxyCODONE-acetaminophen 5-325 MG tablet Commonly known as:  ROXICET Take 1 tablet by mouth every 8 (eight) hours as needed for severe pain.   ranitidine 75 MG tablet Commonly known as:  ZANTAC Take 75 mg by mouth daily.   tamsulosin 0.4 MG Caps capsule Commonly known as:  FLOMAX TAKE 1 CAPSULE (0.4 MG TOTAL) BY MOUTH DAILY.       Diagnostic Studies: Dg Chest 2 View  Result Date: 04/01/2017 CLINICAL DATA:  Spine surgery.  Preoperative exam . EXAM: CHEST  2 VIEW COMPARISON:  Chest x-ray 09/29/2010. FINDINGS: Mediastinum and hilar structures normal. Surgical sutures right mid lung with postsurgical changes in the right lung base. No pleural effusion or pneumothorax. Prior cervical spine fusion. IMPRESSION: Postsurgical changes right lung base. No acute cardiopulmonary disease . Electronically Signed   By: Marcello Moores  Register   On: 04/01/2017 09:05   Dg Cervical Spine 1 View  Result Date: 04/07/2017 CLINICAL DATA:  56 year old male with a history of ACDF EXAM: DG C-ARM 61-120 MIN; DG CERVICAL SPINE - 1 VIEW COMPARISON:  None. FINDINGS: Single limited intraoperative cross-table lateral of the cervical spine, with surgical changes of anterior cervical discectomy infusion with anterior plate and screw fixation and interbody spacer placement at C4-C5. Gastric tube and endotracheal tube in position as well as surgical sponge within the anterior neck IMPRESSION: Cross-table lateral demonstrating anterior cervical discectomy infusion with anterior plate screw fixation and interbody spacer placement at C4-C5. Please refer to the dictated operative report for full details of intraoperative findings and procedure. Electronically Signed   By: Corrie Mckusick D.O.   On: 04/07/2017 13:55   Dg C-arm 1-60 Min  Result Date:  04/07/2017 CLINICAL DATA:  56 year old male with a history of ACDF EXAM: DG C-ARM 61-120 MIN; DG CERVICAL SPINE - 1 VIEW COMPARISON:  None. FINDINGS: Single limited intraoperative cross-table lateral of the cervical spine, with surgical changes of anterior cervical discectomy infusion with anterior plate and screw fixation and interbody spacer placement at C4-C5. Gastric tube and endotracheal tube in position as well as surgical sponge within the anterior neck IMPRESSION: Cross-table lateral demonstrating anterior cervical discectomy infusion with anterior plate screw fixation and interbody spacer placement at C4-C5. Please refer to the dictated operative report for full details of intraoperative findings and procedure. Electronically Signed   By: Corrie Mckusick D.O.   On: 04/07/2017 13:55    Disposition: 01-Home or Self Care   POD #1 s/p C4/5 ACDF procedure, doing well  with resolved bilateral arm pain  - encourage ambulation - Percocet for pain, Valium for muscle spasms -Written scripts for pain signed and in chart -D/C instructions sheet printed and in chart -D/C today  -F/U in office 2 weeks   Signed: Justice Britain 04/14/2017, 11:09 AM

## 2017-04-15 ENCOUNTER — Telehealth: Payer: Self-pay | Admitting: Family Medicine

## 2017-04-15 NOTE — Telephone Encounter (Signed)
Thanks

## 2017-04-15 NOTE — Telephone Encounter (Signed)
Given his heart hx, either have him get input from cards ASAP or go to ER. Thanks.

## 2017-04-15 NOTE — Telephone Encounter (Signed)
[facility]  Patient Name: Tony Manning  DOB: February 24, 1961    Initial Comment chest pains come/goes, tightness, told to get x-ray   Nurse Assessment  Nurse: Harlow Mares, RN, Suanne Marker Date/Time (Eastern Time): 04/15/2017 9:04:18 AM  Confirm and document reason for call. If symptomatic, describe symptoms. ---chest pains come/goes, tightness, told to get x-ray. Had neck surgery last week. Reports that chest pain began last week.  Does the patient have any new or worsening symptoms? ---Yes  Will a triage be completed? ---Yes  Related visit to physician within the last 2 weeks? ---Yes  Does the PT have any chronic conditions? (i.e. diabetes, asthma, etc.) ---Yes  List chronic conditions. ---heart stent hx; diabetes; HTN;  Is this a behavioral health or substance abuse call? ---No     Guidelines    Guideline Title Affirmed Question Affirmed Notes  Chest Pain History of prior "blood clot" in leg or lungs (i.e., deep vein thrombosis, pulmonary embolism)    Final Disposition User   Go to ED Now Harlow Mares, RN, Suanne Marker    Comments  Caller reports that he doesnt' want to spend all day in the ED, wants to see Dr. [name] tomorrow in office. Advised that this would not be safe practice if he is having chest pain today to schedule him to be seen tomorrow. Encouraged caller to stick with ED outcome, caller retused, Dr. [name] doesn;'t have available appt today, caller reports that he will call his heart MD to discuss.   Referrals  GO TO FACILITY REFUSED   Disagree/Comply: Disagree  Disagree/Comply Reason: Disagree with instructions

## 2017-04-15 NOTE — Telephone Encounter (Signed)
Voicemail box not set up, no answer.

## 2017-04-15 NOTE — Telephone Encounter (Signed)
No answer at home or mobile #.  Left detailed message on voicemail of cell phone and home phone.

## 2017-04-15 NOTE — Telephone Encounter (Signed)
I spoke with Mrs Urieta and pt is asleep right now; pt had not decided before going to sleep what he was going to do. Pt did not want to go to ED due to wait time.When pt went to sleep pt was having more problems getting his breath than CP. Mrs Mandt thinks pt is resting now. Mrs Stepanek said she would call cardiologist now and see what card says. FYI to Dr [name].

## 2017-04-20 DIAGNOSIS — G9529 Other cord compression: Secondary | ICD-10-CM | POA: Diagnosis not present

## 2017-04-27 DIAGNOSIS — R69 Illness, unspecified: Secondary | ICD-10-CM | POA: Diagnosis not present

## 2017-05-07 ENCOUNTER — Ambulatory Visit (INDEPENDENT_AMBULATORY_CARE_PROVIDER_SITE_OTHER): Payer: Medicare HMO | Admitting: Family Medicine

## 2017-05-07 ENCOUNTER — Encounter: Payer: Self-pay | Admitting: Family Medicine

## 2017-05-07 VITALS — BP 122/66 | HR 70 | Temp 98.4°F | Wt 216.0 lb

## 2017-05-07 DIAGNOSIS — R5383 Other fatigue: Secondary | ICD-10-CM

## 2017-05-07 LAB — BASIC METABOLIC PANEL
BUN: 16 mg/dL (ref 6–23)
CO2: 25 mEq/L (ref 19–32)
Calcium: 9.9 mg/dL (ref 8.4–10.5)
Chloride: 106 mEq/L (ref 96–112)
Creatinine, Ser: 1.02 mg/dL (ref 0.40–1.50)
GFR: 79.96 mL/min (ref >60.00)
Glucose, Bld: 143 mg/dL — ABNORMAL HIGH (ref 70–99)
Potassium: 4.6 mEq/L (ref 3.5–5.1)
Sodium: 137 mEq/L (ref 135–145)

## 2017-05-07 LAB — CBC WITH DIFFERENTIAL/PLATELET
Basophils Absolute: 0 10*3/uL (ref 0.0–0.1)
Basophils Relative: 0.6 % (ref 0.0–3.0)
Eosinophils Absolute: 0.2 10*3/uL (ref 0.0–0.7)
Eosinophils Relative: 2.7 % (ref 0.0–5.0)
HCT: 43 % (ref 39.0–52.0)
Hemoglobin: 14.3 g/dL (ref 13.0–17.0)
Lymphocytes Relative: 26.4 % (ref 12.0–46.0)
Lymphs Abs: 1.8 10*3/uL (ref 0.7–4.0)
MCHC: 33.3 g/dL (ref 30.0–36.0)
MCV: 90.2 fl (ref 78.0–100.0)
Monocytes Absolute: 0.6 10*3/uL (ref 0.1–1.0)
Monocytes Relative: 8 % (ref 3.0–12.0)
Neutro Abs: 4.4 10*3/uL (ref 1.4–7.7)
Neutrophils Relative %: 62.3 % (ref 43.0–77.0)
Platelets: 203 10*3/uL (ref 150.0–400.0)
RBC: 4.77 Mil/uL (ref 4.22–5.81)
RDW: 13.6 % (ref 11.5–15.5)
WBC: 7 10*3/uL (ref 4.0–10.5)

## 2017-05-07 LAB — TSH: TSH: 1.42 u[IU]/mL (ref 0.35–4.50)

## 2017-05-07 MED ORDER — DULOXETINE HCL 30 MG PO CPEP: 30.0000 mg | ORAL_CAPSULE | Freq: Every day | ORAL | 3 refills | Status: DC

## 2017-05-07 NOTE — Progress Notes (Signed)
He still has some stiffness at night in the neck but his pain is better overall.  He has ortho f/u pending.  He had a lift some pipes with a water leak but had followed lifting restrictions o/w.    He quit cymbalta but he wanted to know about restarting due to L shoulder pain.  He thinks cymbalta helped his neck pain prev, unclear how much it helped his mood prev.    He is fatigued.  "I got no energy."  Caffeine helps a little.  He is having to nap in the afternoon.  He is still wearing his CPAP, compliant.  He approved for a f/u sleep study.  He thinks his mood is lower.  No SI/HI.  He is dejected about not getting his "to-do" list done.  One thing will break around the house before he'll be able to get something else done/fixed.    Sugar has been improved on home checks recently, was higher after his surgery.  Now ~150-160s.    Meds, vitals, and allergies reviewed.   ROS: Per HPI unless specifically indicated in ROS section   GEN: nad, alert and oriented HEENT: mucous membranes moist NECK: supple w/o LA CV: rrr. PULM: ctab, no inc wob ABD: soft, +bs EXT: no edema

## 2017-05-07 NOTE — Patient Instructions (Signed)
Go to the lab on the way out.  We'll contact you with your lab report. Restart cymbalta.  Call about the sleep study.  Take care.  Glad to see you.

## 2017-05-10 DIAGNOSIS — R5383 Other fatigue: Secondary | ICD-10-CM | POA: Insufficient documentation

## 2017-05-10 NOTE — Assessment & Plan Note (Signed)
Likely multifactorial.  Recheck baseline labs today.  Restart cymbalta.  Cymbalta may help with his pain which may in turn help with his mood. It also may help his mood in general, as a primary issue. He'll call about the sleep study, as sleep apnea could still be affecting his symptoms. Okay for outpatient follow-up. He agrees. >25 minutes spent in face to face time with patient, >50% spent in counselling or coordination of care.

## 2017-05-23 ENCOUNTER — Other Ambulatory Visit: Payer: Self-pay | Admitting: Family Medicine

## 2017-05-24 DIAGNOSIS — M545 Low back pain: Secondary | ICD-10-CM | POA: Diagnosis not present

## 2017-05-24 DIAGNOSIS — M5416 Radiculopathy, lumbar region: Secondary | ICD-10-CM | POA: Diagnosis not present

## 2017-05-28 DIAGNOSIS — M545 Low back pain: Secondary | ICD-10-CM | POA: Diagnosis not present

## 2017-06-03 ENCOUNTER — Other Ambulatory Visit: Payer: Self-pay | Admitting: Family Medicine

## 2017-06-03 ENCOUNTER — Ambulatory Visit: Payer: Medicare HMO | Admitting: Podiatry

## 2017-06-07 DIAGNOSIS — I25119 Atherosclerotic heart disease of native coronary artery with unspecified angina pectoris: Secondary | ICD-10-CM | POA: Diagnosis not present

## 2017-06-07 DIAGNOSIS — I10 Essential (primary) hypertension: Secondary | ICD-10-CM | POA: Diagnosis not present

## 2017-06-07 DIAGNOSIS — E118 Type 2 diabetes mellitus with unspecified complications: Secondary | ICD-10-CM | POA: Diagnosis not present

## 2017-06-07 DIAGNOSIS — Z9989 Dependence on other enabling machines and devices: Secondary | ICD-10-CM | POA: Diagnosis not present

## 2017-06-08 DIAGNOSIS — M5416 Radiculopathy, lumbar region: Secondary | ICD-10-CM | POA: Diagnosis not present

## 2017-06-08 DIAGNOSIS — Z9889 Other specified postprocedural states: Secondary | ICD-10-CM | POA: Diagnosis not present

## 2017-06-09 DIAGNOSIS — M545 Low back pain: Secondary | ICD-10-CM | POA: Diagnosis not present

## 2017-06-09 DIAGNOSIS — M5416 Radiculopathy, lumbar region: Secondary | ICD-10-CM | POA: Diagnosis not present

## 2017-06-16 DIAGNOSIS — M67911 Unspecified disorder of synovium and tendon, right shoulder: Secondary | ICD-10-CM | POA: Diagnosis not present

## 2017-06-16 DIAGNOSIS — M67912 Unspecified disorder of synovium and tendon, left shoulder: Secondary | ICD-10-CM | POA: Diagnosis not present

## 2017-06-28 NOTE — Telephone Encounter (Signed)
Scheduled 07/23/17

## 2017-06-29 DIAGNOSIS — G4733 Obstructive sleep apnea (adult) (pediatric): Secondary | ICD-10-CM | POA: Diagnosis not present

## 2017-07-01 ENCOUNTER — Ambulatory Visit: Payer: Medicare HMO | Admitting: Podiatry

## 2017-07-05 DIAGNOSIS — H524 Presbyopia: Secondary | ICD-10-CM | POA: Diagnosis not present

## 2017-07-05 DIAGNOSIS — M25512 Pain in left shoulder: Secondary | ICD-10-CM | POA: Diagnosis not present

## 2017-07-05 DIAGNOSIS — H25041 Posterior subcapsular polar age-related cataract, right eye: Secondary | ICD-10-CM | POA: Diagnosis not present

## 2017-07-05 DIAGNOSIS — E119 Type 2 diabetes mellitus without complications: Secondary | ICD-10-CM | POA: Diagnosis not present

## 2017-07-05 DIAGNOSIS — H25011 Cortical age-related cataract, right eye: Secondary | ICD-10-CM | POA: Diagnosis not present

## 2017-07-05 LAB — HM DIABETES EYE EXAM

## 2017-07-08 DIAGNOSIS — N200 Calculus of kidney: Secondary | ICD-10-CM | POA: Diagnosis not present

## 2017-07-09 DIAGNOSIS — N200 Calculus of kidney: Secondary | ICD-10-CM | POA: Diagnosis not present

## 2017-07-13 DIAGNOSIS — N200 Calculus of kidney: Secondary | ICD-10-CM | POA: Diagnosis not present

## 2017-07-15 DIAGNOSIS — G4733 Obstructive sleep apnea (adult) (pediatric): Secondary | ICD-10-CM | POA: Diagnosis not present

## 2017-07-15 DIAGNOSIS — E6609 Other obesity due to excess calories: Secondary | ICD-10-CM | POA: Diagnosis not present

## 2017-07-15 DIAGNOSIS — I2510 Atherosclerotic heart disease of native coronary artery without angina pectoris: Secondary | ICD-10-CM | POA: Diagnosis not present

## 2017-07-20 ENCOUNTER — Encounter: Payer: Self-pay | Admitting: Family Medicine

## 2017-07-23 ENCOUNTER — Ambulatory Visit: Payer: Medicare HMO

## 2017-08-06 ENCOUNTER — Encounter: Payer: Medicare HMO | Admitting: Family Medicine

## 2017-08-07 DIAGNOSIS — M67912 Unspecified disorder of synovium and tendon, left shoulder: Secondary | ICD-10-CM | POA: Diagnosis not present

## 2017-08-11 DIAGNOSIS — G4733 Obstructive sleep apnea (adult) (pediatric): Secondary | ICD-10-CM | POA: Diagnosis not present

## 2017-08-19 ENCOUNTER — Encounter: Payer: Self-pay | Admitting: *Deleted

## 2017-08-20 ENCOUNTER — Ambulatory Visit (INDEPENDENT_AMBULATORY_CARE_PROVIDER_SITE_OTHER): Payer: Medicare HMO | Admitting: Family Medicine

## 2017-08-20 ENCOUNTER — Encounter: Payer: Self-pay | Admitting: Family Medicine

## 2017-08-20 VITALS — BP 122/70 | HR 67 | Temp 97.8°F | Wt 214.5 lb

## 2017-08-20 DIAGNOSIS — G8929 Other chronic pain: Secondary | ICD-10-CM

## 2017-08-20 DIAGNOSIS — Z794 Long term (current) use of insulin: Secondary | ICD-10-CM

## 2017-08-20 DIAGNOSIS — E1149 Type 2 diabetes mellitus with other diabetic neurological complication: Secondary | ICD-10-CM

## 2017-08-20 LAB — HEMOGLOBIN A1C: Hgb A1c MFr Bld: 7.7 % — ABNORMAL HIGH (ref 4.6–6.5)

## 2017-08-20 MED ORDER — DICLOFENAC SODIUM 1 % TD GEL: 2.0000 g | Freq: Four times a day (QID) | TRANSDERMAL | 3 refills | Status: DC

## 2017-08-20 MED ORDER — DULOXETINE HCL 60 MG PO CPEP: 60.0000 mg | ORAL_CAPSULE | Freq: Every day | ORAL | 3 refills | Status: DC

## 2017-08-20 NOTE — Progress Notes (Signed)
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    He is still having a lot of B shoulder pain, B elbow and B hand pain.  He had prev ortho eval re: his shoulders but that won't likely help with the B elbow and carpal tunnel pain.   He is reportedly a candidate for B knee scopes, assuming he didn't have other health concerns that would prevent intervention.    He still has lower back pain.    He can't go through with all of the corrective procedures and he wanted to get set up with a pain clinic.  He was asking options.  He is still on cymbalta at baseline with some partial relief.  He was prev on higher dose.  He is off opiates except for occ tylenol #3 use.  D/w pt.  He has used tizanidine occ.    He had to cancel his CPE since his father had to go back on chemo for lymphoma.  D/w pt.    DM2.  Due for f/u labs.  Still on baseline meds.  No ADE on meds.  See notes on labs.  Can get sugar as low as 100 but occ up to 200.  Usually 140-190, sugar is better the day after he has more activity, but that is limited by pain.  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    Flu shot done 3 months ago per patient report.    Meds, vitals, and allergies reviewed.   ROS: Per HPI unless specifically indicated in ROS section   GEN: nad, alert and oriented HEENT: mucous membranes moist NECK: supple w/o LA CV: rrr. PULM: ctab, no inc wob ABD: soft, +bs EXT: no edema SKIN: no acute rash R 3rd and 4th IP joints puffy and tender- had used voltaren gel in the past with relief.    Diabetic foot exam: Normal inspection No skin breakdown No calluses  Normal DP pulses Normal sensation to light touch but dec sens to monofilament on BLE Nails normal

## 2017-08-20 NOTE — Patient Instructions (Addendum)
Tony Manning will call about your referral. Use the voltaren gel as needed on your hands.   Go to the lab on the way out.  We'll contact you with your lab report. Try the higher dose of cymbalta in the meantime and see if that helps.   Take care.  Glad to see you.  Update me as needed.

## 2017-08-22 DIAGNOSIS — G8929 Other chronic pain: Secondary | ICD-10-CM | POA: Insufficient documentation

## 2017-08-22 NOTE — Assessment & Plan Note (Signed)
Reasonable to use Voltaren gel, refer to pain clinic, increase Cymbalta to 60 mg a day.  He agrees. See AVS.

## 2017-08-22 NOTE — Assessment & Plan Note (Signed)
No change in meds at this point.  See notes on labs. 

## 2017-08-30 DIAGNOSIS — N200 Calculus of kidney: Secondary | ICD-10-CM | POA: Diagnosis not present

## 2017-09-02 DIAGNOSIS — N200 Calculus of kidney: Secondary | ICD-10-CM | POA: Diagnosis not present

## 2017-09-08 ENCOUNTER — Encounter: Payer: Self-pay | Admitting: *Deleted

## 2017-09-08 ENCOUNTER — Telehealth: Payer: Self-pay | Admitting: Family Medicine

## 2017-09-08 NOTE — Telephone Encounter (Signed)
Pt reporting back, groin pain "the same as when I have a kidney stone."  Reports h/o passing kidney stones. Agent unable to get appt. with PCP. Offered to call [facility] office to see if they could fit him in. Offered to find appt in other practice; declined all other options. States he will call urologist. Instructed to go to ED or UC with increased pain, worsening symptoms.

## 2017-09-08 NOTE — Telephone Encounter (Signed)
This encounter was created in error - please disregard.

## 2017-09-11 DIAGNOSIS — G4733 Obstructive sleep apnea (adult) (pediatric): Secondary | ICD-10-CM | POA: Diagnosis not present

## 2017-09-13 DIAGNOSIS — R69 Illness, unspecified: Secondary | ICD-10-CM | POA: Diagnosis not present

## 2017-09-14 ENCOUNTER — Ambulatory Visit (INDEPENDENT_AMBULATORY_CARE_PROVIDER_SITE_OTHER): Payer: Medicare HMO

## 2017-09-14 ENCOUNTER — Other Ambulatory Visit: Payer: Self-pay | Admitting: Podiatry

## 2017-09-14 ENCOUNTER — Ambulatory Visit: Payer: Medicare HMO | Admitting: Podiatry

## 2017-09-14 DIAGNOSIS — S92505D Nondisplaced unspecified fracture of left lesser toe(s), subsequent encounter for fracture with routine healing: Secondary | ICD-10-CM | POA: Diagnosis not present

## 2017-09-14 DIAGNOSIS — M2010 Hallux valgus (acquired), unspecified foot: Secondary | ICD-10-CM

## 2017-09-14 DIAGNOSIS — M2041 Other hammer toe(s) (acquired), right foot: Secondary | ICD-10-CM

## 2017-09-14 DIAGNOSIS — S99922A Unspecified injury of left foot, initial encounter: Secondary | ICD-10-CM

## 2017-09-14 DIAGNOSIS — M779 Enthesopathy, unspecified: Secondary | ICD-10-CM

## 2017-09-14 DIAGNOSIS — E1149 Type 2 diabetes mellitus with other diabetic neurological complication: Secondary | ICD-10-CM | POA: Diagnosis not present

## 2017-09-14 DIAGNOSIS — M2042 Other hammer toe(s) (acquired), left foot: Secondary | ICD-10-CM | POA: Diagnosis not present

## 2017-09-14 NOTE — Progress Notes (Signed)
Subjective: [place] since the acceptance of the left fourth toe pain which happened about 1 month ago.  He said no treatment to the toe but he did hit the toe on the corner causing pain and swelling.  He states he was doing better until his dad stopped on the toe last night.  He still has some swelling in the max his pain level is 3/10 describes an aching sensation.  The area is painful if you press on it.  Denies any open sores.  He is requesting new diabetic shoes as well.  He has no other concerns today. Denies any systemic complaints such as fevers, chills, nausea, vomiting. No acute changes since last appointment, and no other complaints at this time.   Objective: AAO x3, NAD DP/PT pulses palpable bilaterally, CRT less than 3 seconds There is mild edema to the left fourth toe but there is no skin breakdown.  Mild tenderness palpation to the digit.  There is no pain to the metatarsal other areas of the toes.  No other area of tenderness identified to bilateral lower extremities.  There is no pain in the heels today. Minimal hyperkeratotic tissue medial hallux bilaterally.  No signs of infection Hammertoes, HAV present bilaterally No open lesions or pre-ulcerative lesions.  No pain with calf compression, swelling, warmth, erythema  Assessment: Left fourth toe likely fracture; diabetic foot exam  Plan: -All treatment options discussed with the patient including all alternatives, risks, complications.  -X-rays were obtained and reviewed.  There is a questionable radiolucency in the proximal phalanx of the left fourth toe.  There is no other evidence of acute fracture identified today.  -I showed him how to tape the toe to help with swelling.  Continue with supportive shoe.  If not improved in the next 3-4 weeks we will get a new x-ray. -Paperwork was completed today for precertification of diabetic shoes.  Given his history of neuropathy as well as digital deformity and callus formation I  recommended diabetic shoes. -Patient encouraged to call the office with any questions, concerns, change in symptoms.   Trula Slade DPM

## 2017-09-21 ENCOUNTER — Other Ambulatory Visit: Payer: Self-pay

## 2017-09-21 ENCOUNTER — Encounter: Payer: Self-pay | Admitting: Nurse Practitioner

## 2017-09-21 ENCOUNTER — Ambulatory Visit: Payer: Medicare HMO | Attending: Nurse Practitioner | Admitting: Nurse Practitioner

## 2017-09-21 VITALS — BP 135/75 | HR 73 | Temp 97.7°F | Resp 16 | Ht 70.0 in | Wt 215.0 lb

## 2017-09-21 DIAGNOSIS — M79605 Pain in left leg: Secondary | ICD-10-CM

## 2017-09-21 DIAGNOSIS — Z7982 Long term (current) use of aspirin: Secondary | ICD-10-CM | POA: Diagnosis not present

## 2017-09-21 DIAGNOSIS — M545 Low back pain, unspecified: Secondary | ICD-10-CM | POA: Insufficient documentation

## 2017-09-21 DIAGNOSIS — M5442 Lumbago with sciatica, left side: Secondary | ICD-10-CM | POA: Diagnosis not present

## 2017-09-21 DIAGNOSIS — M899 Disorder of bone, unspecified: Secondary | ICD-10-CM | POA: Diagnosis not present

## 2017-09-21 DIAGNOSIS — I252 Old myocardial infarction: Secondary | ICD-10-CM | POA: Diagnosis not present

## 2017-09-21 DIAGNOSIS — Z794 Long term (current) use of insulin: Secondary | ICD-10-CM | POA: Diagnosis not present

## 2017-09-21 DIAGNOSIS — M25512 Pain in left shoulder: Secondary | ICD-10-CM | POA: Insufficient documentation

## 2017-09-21 DIAGNOSIS — Z789 Other specified health status: Secondary | ICD-10-CM | POA: Insufficient documentation

## 2017-09-21 DIAGNOSIS — K219 Gastro-esophageal reflux disease without esophagitis: Secondary | ICD-10-CM | POA: Diagnosis not present

## 2017-09-21 DIAGNOSIS — M79604 Pain in right leg: Secondary | ICD-10-CM | POA: Insufficient documentation

## 2017-09-21 DIAGNOSIS — D869 Sarcoidosis, unspecified: Secondary | ICD-10-CM | POA: Insufficient documentation

## 2017-09-21 DIAGNOSIS — M79606 Pain in leg, unspecified: Secondary | ICD-10-CM

## 2017-09-21 DIAGNOSIS — G894 Chronic pain syndrome: Secondary | ICD-10-CM | POA: Insufficient documentation

## 2017-09-21 DIAGNOSIS — E119 Type 2 diabetes mellitus without complications: Secondary | ICD-10-CM | POA: Insufficient documentation

## 2017-09-21 DIAGNOSIS — M542 Cervicalgia: Secondary | ICD-10-CM | POA: Diagnosis not present

## 2017-09-21 DIAGNOSIS — G8929 Other chronic pain: Secondary | ICD-10-CM | POA: Insufficient documentation

## 2017-09-21 DIAGNOSIS — M79601 Pain in right arm: Secondary | ICD-10-CM

## 2017-09-21 DIAGNOSIS — I10 Essential (primary) hypertension: Secondary | ICD-10-CM | POA: Diagnosis not present

## 2017-09-21 DIAGNOSIS — M79603 Pain in arm, unspecified: Secondary | ICD-10-CM

## 2017-09-21 DIAGNOSIS — I2510 Atherosclerotic heart disease of native coronary artery without angina pectoris: Secondary | ICD-10-CM | POA: Diagnosis not present

## 2017-09-21 DIAGNOSIS — M79602 Pain in left arm: Secondary | ICD-10-CM | POA: Diagnosis not present

## 2017-09-21 DIAGNOSIS — M5441 Lumbago with sciatica, right side: Secondary | ICD-10-CM

## 2017-09-21 DIAGNOSIS — Z79899 Other long term (current) drug therapy: Secondary | ICD-10-CM | POA: Diagnosis not present

## 2017-09-21 DIAGNOSIS — Z79891 Long term (current) use of opiate analgesic: Secondary | ICD-10-CM | POA: Diagnosis not present

## 2017-09-21 NOTE — Progress Notes (Signed)
Patient's Name: Tony Manning  MRN: [id number]  Referring Provider: Tonia Ghent, MD  DOB: 01/27/1961  PCP: Tonia Ghent, MD  DOS: 09/21/2017  Note by: Dionisio David NP  Service setting: Ambulatory outpatient  Specialty: Interventional Pain Management  Location: [facility] (AMB) Pain Management Facility    Patient type: New Patient    Primary Reason(s) for Visit: Initial Patient Evaluation CC: Neck Pain (back of neck, left side); Shoulder Pain (left); Arm Pain (right and left); Back Pain (lower); Leg Pain (bilaterally); and Hip Pain (bilaterally)  HPI  Mr. Lancour is a 57 y.o. year old, male patient, who comes today for an initial evaluation. He has ESSENTIAL HYPERTENSION, BENIGN; CORONARY ATHEROSCLEROSIS NATIVE CORONARY ARTERY; SLEEP APNEA; CHEST PAIN UNSPECIFIED; PERCUTANEOUS TRANSLUMINAL CORONARY ANGIOPLASTY, HX OF; 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[place]); HYPERCHOLESTEROLEMIA; DEPRESSION; OSTEOARTHRITIS; ARTHRITIS; NEPHROLITHIASIS, HX OF; CH[place], HX OF; Neck pain; Hearing loss; Skin lesion of face; Cough; Tobacco abuse; Irritable mood; Pain in joint, ankle and foot; Medicare annual wellness visit, initial; Migraine, unspecified, without mention of intractable migraine without mention of status migrainosus; Benign paroxysmal positional vertigo; Orthostasis; Memory change; Atypical chest pain; NSTEMI (non-ST elevated myocardial infarction) ([place]); Premature ejaculation; Heel pain; Radicular pain in right arm; Hand pain; Right sided sciatica; Plantar fasciitis; Sciatica; Dysphagia; Angina pectoris ([place]); LLQ pain; Radiculopathy; Other fatigue; Other chronic pain; Cervical radiculitis; Chronic neck pain (Primary Area of Pain) (B) (L>R); Chronic upper extremity pain (Secondary Area of Pain) (Bilateral) (L>R); Chronic left shoulder pain (Tertiary Area of Pain); Chronic low back pain (Fourth Area of Pain) (B) (R>L); Chronic pain of lower extremity (B) (R>L); Long term prescription  opiate use; Disorder of bone, unspecified; Other long term (current) drug therapy; and Other specified health status on their problem list.. His primarily concern today is the Neck Pain (back of neck, left side); Shoulder Pain (left); Arm Pain (right and left); Back Pain (lower); Leg Pain (bilaterally); and Hip Pain (bilaterally)  Pain Assessment: Location: Left, Posterior Neck Radiating: to both shoulders, down both arms to hand and middle 2 fingers, more prevalent on left side Onset: More than a month ago Duration: Chronic pain Quality: Aching, Burning, Constant, Shooting, Stabbing, Throbbing Severity: 2 /10 (self-reported pain score)  Note: Reported level is compatible with observation.                          Effect on ADL: difficult to walk any distance, lift, climb steps, or get up from a sitting position Timing: Constant Modifying factors: medications, heatin pad to neck  Onset and Duration: Present longer than 3 months Cause of pain: Work related accident or event Severity: Getting worse, NAS-11 at its worse: 9/10, NAS-11 at its best: 3/10, NAS-11 now: 5/10 and NAS-11 on the average: 7/10 Timing: Not influenced by the time of the day, During activity or exercise, After activity or exercise and After a period of immobility Aggravating Factors: Bending, Climbing, Kneeling, Lifiting, Motion, Prolonged sitting, Prolonged standing, Squatting, Stooping , Twisting, Walking, Walking uphill, Walking downhill and Working Alleviating Factors: Lying down, Medications, Resting, Sleeping and Warm showers or baths Associated Problems: Day-time cramps, Night-time cramps, Dizziness, Erectile dysfunction, Fatigue, Numbness, Spasms, Swelling, Tingling, Weakness, Pain that wakes patient up and Pain that does not allow patient to sleep Quality of Pain: Aching, Agonizing, Annoying, Burning, Intermittent, Disabling, Dull, Itching, Sharp, Shooting, Stabbing, Throbbing, Tingling, Tiring and  Uncomfortable Previous Examinations or Tests: The patient denies previous exams or tests Previous Treatments: The patient  denies previous treatments  The patient comes into the clinics today for the first time for a chronic pain management evaluation. According to the patient his primary area of pain is in his neck. He is SP cervical fusion in August 2018. He admits that this was the second surgery. He is SP CESI which was effective for one week. He denies physical therapy. He has had a xray with [employer].   His second area of pain is in his left shoulder. He admits that he is need of surgery but secondary to his cardiac history; MI 2016 and 5 stents last in 10/2016. He must complete a stress test to be cleared. He is scheduled for this in February. He has had shoulder injections in the past however they were not effective. He has had a recent MRI.  His third area of pain is in his upper extremeities. He admits that the left side is greater than the right. He does have numbness tingling and stiffness that goes into the fingers. He admits that this is a 3 and 4.  His fourth area of pain is in his lower back. He denies any previous surgeries, interventional therapy. He admits that he did do physical therapy with home exercise in the past which were helpful. He also mentioned some of the exercises that the pain worse. He has had a recent MRI.  His last area of pain is in his lower extremities. The right side is greater than the left. He admits that he has pain that goes down to the top of his foot. He has numbness tingling and weakness in his lower extremities.  He admits that overall his pain is under control secondary to he has not been very active at home Today I took the time to provide the patient with information regarding this pain practice. The patient was informed that the practice is divided into two sections: an interventional pain management section, as well as a completely  separate and distinct medication management section. I explained that there are procedure days for interventional therapies, and evaluation days for follow-ups and medication management. Because of the amount of documentation required during both, they are kept separated. This means that there is the possibility that he may be scheduled for a procedure on one day, and medication management the next. I have also informed him that because of staffing and facility limitations, this practice will no longer take patients for medication management only. To illustrate the reasons for this, I gave the patient the example of surgeons, and how inappropriate it would be to refer a patient to his/her care, just to write for the post-surgical antibiotics on a surgery done by a different surgeon.   Because interventional pain management is part of the board-certified specialty for the doctors, the patient was informed that joining this practice means that they are open to any and all interventional therapies. I made it clear that this does not mean that they will be forced to have any procedures done. What this means is that I believe interventional therapies to be essential part of the diagnosis and proper management of chronic pain conditions. Therefore, patients not interested in these interventional alternatives will be better served under the care of a different practitioner.  The patient was also made aware of my Comprehensive Pain Management Safety Guidelines where by joining this practice, they limit all of their nerve blocks and joint injections to those done by our practice, for as long as we are retained  to manage their care. Historic Controlled Substance Pharmacotherapy Review  PMP and historical list of controlled substances: Triazolam 0.125 mg, acetaminophen codeine No. 3, diazepam 5 mg, oxycodone/acetaminophen 5/325 mg, alprazolam 0.5 mg, hydrocodone/acetaminophen 5/325 mg, tramadol 50 mg Highest opioid  analgesic regimen found: *Oxycodone/acetaminophen 5/325 one tablet every 4 hours ( fill date 06/04/2016) oxycodone 30 mg per day Most recent opioid analgesic: Acetaminophen codeine No. 3 one tablet 5 times daily (fill date 06/08/2017) Current opioid analgesics: None Highest recorded MME/day: 45 mg/day MME/day: 0 mg/day Medications: The patient did not bring the medication(s) to the appointment, as requested in our "New Patient Package" Pharmacodynamics: Desired effects: Analgesia: The patient reports >50% benefit. Reported improvement in function: The patient reports medication allows him to accomplish basic ADLs. Clinically meaningful improvement in function (CMIF): Sustained CMIF goals met Perceived effectiveness: Described as relatively effective, allowing for increase in activities of daily living (ADL) Undesirable effects: Side-effects or Adverse reactions: None reported Historical Monitoring: The patient  reports that he does not use drugs. List of all UDS Test(s): No results found for: MDMA, COCAINSCRNUR, PCPSCRNUR, PCPQUANT, CANNABQUANT, THCU, [place] List of all Serum Drug Screening Test(s):  No results found for: AMPHSCRSER, BARBSCRSER, BENZOSCRSER, COCAINSCRSER, PCPSCRSER, PCPQUANT, THCSCRSER, CANNABQUANT, OPIATESCRSER, OXYSCRSER, PROPOXSCRSER Historical Background Evaluation: [place] PDMP: Six (6) year initial data search conducted.             [place] Department of public safety, offender search: Editor, commissioning Information) Non-contributory Risk Assessment Profile: Aberrant behavior: None observed or detected today Risk factors for fatal opioid overdose: None identified today Fatal overdose hazard ratio (HR): Calculation deferred Non-fatal overdose hazard ratio (HR): Calculation deferred Risk of opioid abuse or dependence: 0.7-3.0% with doses ? 36 MME/day and 6.1-26% with doses ? 120 MME/day. Substance use disorder (SUD) risk level: Pending results of Medical Psychology Evaluation for  SUD Opioid risk tool (ORT) (Total Score): 1  ORT Scoring interpretation table:  Score <3 = Low Risk for SUD  Score between 4-7 = Moderate Risk for SUD  Score >8 = High Risk for Opioid Abuse   PHQ-2 Depression Scale:  Total score: 0  PHQ-2 Scoring interpretation table: (Score and probability of major depressive disorder)  Score 0 = No depression  Score 1 = 15.4% Probability  Score 2 = 21.1% Probability  Score 3 = 38.4% Probability  Score 4 = 45.5% Probability  Score 5 = 56.4% Probability  Score 6 = 78.6% Probability   PHQ-9 Depression Scale:  Total score: 0  PHQ-9 Scoring interpretation table:  Score 0-4 = No depression  Score 5-9 = Mild depression  Score 10-14 = Moderate depression  Score 15-19 = Moderately severe depression  Score 20-27 = Severe depression (2.4 times higher risk of SUD and 2.89 times higher risk of overuse)   Pharmacologic Plan: Pending ordered tests and/or consults  Meds  The patient has a current medication list which includes the following prescription(s): aspirin, atorvastatin, carvedilol, clopidogrel, diclofenac sodium, duloxetine, glimepiride, glucose blood, insulin pen needle, levemir flextouch, losartan, metformin, nitroglycerin, ranitidine, and tamsulosin.  Current Outpatient Medications on File Prior to Visit  Medication Sig  . aspirin 81 MG chewable tablet Chew 81 mg by mouth at bedtime.  [name] Kitchen atorvastatin (LIPITOR) 40 MG tablet Take 1 tablet (40 mg total) by mouth daily.  . carvedilol (COREG) 6.25 MG tablet Take 6.25 mg by mouth 2 (two) times daily with a meal.  . clopidogrel (PLAVIX) 75 MG tablet Take by mouth.  . diclofenac sodium (VOLTAREN) 1 % GEL Apply 2-4  g topically 4 (four) times daily.  . DULoxetine (CYMBALTA) 60 MG capsule Take 1 capsule (60 mg total) by mouth daily.  [name] Kitchen glimepiride (AMARYL) 4 MG tablet TAKE 1 TABLET BY MOUTH TWICE A DAY  . glucose blood (ONE TOUCH ULTRA TEST) test strip Check sugar up to 6 times a day.  DM2, insulin  treated, uncontrolled.  . Insulin Pen Needle 31G X 4 MM MISC 1 Device by Does not apply route daily. Use daily with insulin needle  . LEVEMIR FLEXTOUCH 100 UNIT/ML Pen INJECT 40 UNITS SUBQ EVERY AM. BS > 120: ADD 1 UNIT, BS <80: SUBTRACT 1 UNIT. BS 80-120: NO CHANGE  . losartan (COZAAR) 100 MG tablet Take 0.5 tablets (50 mg total) by mouth 2 (two) times daily.  . metFORMIN (GLUCOPHAGE) 1000 MG tablet TAKE 1 TABLET BY MOUTH TWICE A DAY WITH A MEAL  . nitroGLYCERIN (NITROSTAT) 0.4 MG SL tablet Place 1 tablet (0.4 mg total) under the tongue every 5 (five) minutes as needed for chest pain.  . ranitidine (ZANTAC) 75 MG tablet Take 75 mg by mouth daily.  . tamsulosin (FLOMAX) 0.4 MG CAPS capsule TAKE 1 CAPSULE (0.4 MG TOTAL) BY MOUTH DAILY.   No current facility-administered medications on file prior to visit.    Imaging Review  Cervical Imaging: Cervical MR wo contrast:  Results for orders placed during the hospital encounter of 04/09/15  MR Cervical Spine Wo Contrast   Narrative CLINICAL DATA:  Radicular pain into the right arm.  EXAM: MRI CERVICAL SPINE WITHOUT CONTRAST  TECHNIQUE: Multiplanar, multisequence MR imaging of the cervical spine was performed. No intravenous contrast was administered.  COMPARISON:  Cervical spine CT 03/31/2011  FINDINGS: C5-6 and C6-7 anterior cervical discectomy with ventral plate and screw fixation. Bony fusion is complete.  No marrow signal abnormality suggestive of fracture, infection, or neoplasm.  Normal cord signal and morphology.  No extra-spinal findings to explain. Normal flow related signal loss in the visible cervical and carotid arteries.  Degenerative changes:  C2-3: Mild facet and right uncovertebral spurring with mild right foraminal stenosis, similar to prior.  C3-4: Right uncovertebral and mild facet spurring. Posterior disc bulging which mildly deforms the ventral thecal sac without significant canal stenosis. Moderate right  foraminal narrowing.  C4-5: Adjacent segment disease with advanced uncovertebral spurring on the right. The spurring causes high-grade right foraminal stenosis. The left foramen is patent. Posterior disc osteophyte complex results in near complete effacement of CSF and the right canal. No cord compression.  C5-6: Discectomy with completed bony fusion. Residual osteophytic ridging left paracentral at the disc level with mild left cord remodeling but no cord compression. Open foramina.  C6-7: Discectomy with complete bony fusion and wide patency of the foramina and canal.  C7-T1:Unremarkable.  IMPRESSION: 1. C4-5 adjacent segment disease with high-grade right foraminal stenosis and C5 impingement from uncovertebral disease. 2. C3-4 moderate right foraminal stenosis. 3. C5-6 and C6-7 discectomy with complete bony fusion.   Electronically Signed   By: Monte Fantasia M.D.   On: 04/09/2015 13:00     Cervical CT wo contrast:  Results for orders placed during the hospital encounter of 03/31/11  CT Cervical Spine Wo Contrast   Narrative *RADIOLOGY REPORT*  Clinical Data: Neck pain for 6-7 months.   Numbness and tingling in the arms.  CT CERVICAL SPINE WITHOUT CONTRAST  Technique:  Multidetector CT imaging of the cervical spine was performed. Multiplanar CT image reconstructions were also generated.  Comparison: CT cervical spine 10/16/2008.  Findings: The patient is status post C5-C7 ACDF with anterior plating.  The fusion appears solid.  The hardware is intact. Anatomic alignment is observed.  The individual disc spaces were examined as follows:  C2-3:  Mild bulge.  C3-4:  Shallow central protrusion.  Asymmetric uncinate spurring on the right.  Possible right C4 nerve root encroachment.  C4-5:  Central and rightward disc extrusion.  Right-sided uncinate spurring.  Mild facet arthropathy.  Right C5 nerve root encroachment.  Mild right-sided canal stenosis.  C5-6:   Solid fusion.  Mild residual stenosis due to osteophytic spurring.  No definite foraminal narrowing.  C6-7:  Normal post fusion interspace.  C7-T1:  Normal disc space.  Mild facet arthropathy.  Compared to the prior study, the fusion is now solid at C4-5 and C5- 6.  There was uncinate spurring at C4-5 on the right demonstrated previously but no disc extrusion was observed.  IMPRESSION: Solid C5-C7 fusion.  Adjacent segment disease at C4-5 with a central and rightward disc extrusion associated uncinate spurring; right C5 neural encroachment likely.  Small central protrusion at C3-4 along with asymmetric uncinate spurring on the right likely affects the right C4 nerve root.  Original Report Authenticated By: Staci Righter, M.D.   Cervical DG 1 view:  Results for orders placed during the hospital encounter of 04/07/17  DG Cervical Spine 1 View   Narrative CLINICAL DATA:  57 year old male with a history of ACDF  EXAM: DG C-ARM 61-120 MIN; DG CERVICAL SPINE - 1 VIEW  COMPARISON:  None.  FINDINGS: Single limited intraoperative cross-table lateral of the cervical spine, with surgical changes of anterior cervical discectomy infusion with anterior plate and screw fixation and interbody spacer placement at C4-C5.  Gastric tube and endotracheal tube in position as well as surgical sponge within the anterior neck  IMPRESSION: Cross-table lateral demonstrating anterior cervical discectomy infusion with anterior plate screw fixation and interbody spacer placement at C4-C5. Please refer to the dictated operative report for full details of intraoperative findings and procedure.   Electronically Signed   By: Corrie Mckusick D.O.   On: 04/07/2017 13:55    Cervical DG 2-3 views:  Results for orders placed during the hospital encounter of 12/29/07  DG Cervical Spine 2-3 Views   Narrative Clinical Data: Cervical fusion   CERVICAL SPINE - 2-3 VIEW   Comparison: Intraoperative  films from same date   Findings: Anterior plate and screw and interbody bone plugs fusing C5-C7.  Normal alignment and no complicating features.   IMPRESSION: C5-7 fusion.  Provider: Daiva Huge  DG Cervical Spine 2-3 Views   Narrative Clinical Data: Cervical disc herniations   CERVICAL SPINE - 2-3 VIEW   Comparison: 12/29/2007   Findings: Three fluoroscopic spot images from the operating demonstrate anterior plate screws and and interbody bone plugs fusing C5-C6 and C6-C7.  Normal alignment.  No complicating features.   IMPRESSION: C5-C7 fusion  Provider: Juline Patch   Cervical DG complete:  Results for orders placed during the hospital encounter of 08/16/13  DG Cervical Spine Complete   Narrative CLINICAL DATA:  Status post fusion with neck pain  EXAM: CERVICAL SPINE  4+ VIEWS  COMPARISON:  03/31/2011  FINDINGS: Postsurgical changes are noted at C5-6 and C6-7. The overall appearance is similar to that seen on the prior exam. Mild osteophytic changes are noted at C4-5 which have increased slightly in the interval from the prior study. The neural foramina appear patent. No odontoid abnormality is seen. No soft  tissue changes are noted.  IMPRESSION: Postoperative change stable from the prior exam. Slight increase in osteophytic change at C4-5.   Electronically Signed   By: Inez Catalina M.D.   On: 08/16/2013 12:06    Shoulder Imaging:  Shoulder-R MR wo contrast:  Results for orders placed during the hospital encounter of 02/02/17  MR SHOULDER RIGHT WO CONTRAST   Narrative CLINICAL DATA:  Right shoulder pain for 2 years.  Weakness.  EXAM: MRI OF THE RIGHT SHOULDER WITHOUT CONTRAST  TECHNIQUE: Multiplanar, multisequence MR imaging of the shoulder was performed. No intravenous contrast was administered.  COMPARISON:  None.  FINDINGS: Rotator cuff: There is a small partial-thickness bursal surface tear of the anterior aspect of the  distal supraspinous tendon, approximately 9 mm in diameter. There is tendinopathy of the distal subscapularis tendon without a tear.  Muscles: No atrophy or abnormal signal of the muscles of the rotator cuff.  Biceps long head:  Properly located and intact.  Acromioclavicular Joint: Minimal degenerative changes of the AC joint. Type 2 acromion. Focal subacromial bursitis.  Glenohumeral Joint: Normal.  Labrum:  Normal.  Bones:  Normal.  Other: None  IMPRESSION: 1. Small partial-thickness bursal surface tear of the anterior aspect of the distal supraspinous tendon. 2. Subacromial bursitis. 3. Slight tendinopathy of the subscapularis tendon without a tear.   Electronically Signed   By: Lorriane Shire M.D.   On: 02/03/2017 09:00     Lumbosacral Imaging:  Results for orders placed in visit on 11/24/01  DG Lumbar Spine Complete   Narrative FINDINGS CLINICAL DATA:   POST FALL INJURY WITH LUMBAR SPINE PAIN. LUMBAR SPINE FOUR VIEWS FIVE NON RIB BEARING LUMBAR VERTEBRAE ARE SEEN WITH MINIMAL DEGENERATIVE [place] SPACE NARROWING AT L5- S1.  NO ACUTE FRACTURE OR SUBLUXATION IS SEEN.  NO OTHER SIGNIFICANT ABNORMALITY IS SEEN. IMPRESSION 1.  MINIMAL DEGENERATIVE DISC SPACE NARROWING L5-S1. 2.  OTHERWISE, NEGATIVE.   Spine Imaging:  Spine Outside MR Films:  Results for orders placed in visit on 06/17/16  MR OUTSIDE FILMS SPINE    Results for orders placed in visit on 06/18/16  DG Outside Films Spine    Note: Available results from prior imaging studies were reviewed.        ROS  Cardiovascular History: Heart trouble, Abnormal heart rhythm, Daily Aspirin intake, High blood pressure, Chest pain, Heart attack ( Date: 06/2015), Heart murmur, Heart catheterization and Blood thinners:  Anticoagulant Pulmonary or Respiratory History: Shortness of breath, Snoring , Coughing up mucus (Bronchitis), Sarcoidosis and Temporary stoppage of breathing during sleep Neurological History:  Abnormal skin sensations (Peripheral Neuropathy) and Curved spine Review of Past Neurological Studies:  Results for orders placed or performed during the hospital encounter of 10/23/05  MR Brain W Wo Contrast   Narrative   Clinical Data: Headaches, memory problems.  MRI BRAIN WITHOUT AND WITH CONTRAST:  Technique: Multiplanar and multiecho pulse sequences of the brain and surrounding structures were obtained according to standard protocol before and after administration of intravenous contrast.  Contrast: 20 cc [place].  Comparison: CT 10/16/05.  The ventricles are normal in size. There is no infarct or mass. The white matter appears normal. The enhancement pattern is normal, and there is no enhancing mass lesion.  IMPRESSION:  Negative.  Provider: Bethann Goo  Results for orders placed or performed during the hospital encounter of 10/16/05  CT Head Wo Contrast   Narrative         Clinical Data:   Short-term memory loss; headache,  dizziness.  No known head trauma.  History of  hypertension.  Coronary artery disease.  Migraine headache.  Diabetes. HEAD CT WITHOUT CONTRAST: Technique:   Contiguous axial images were obtained from the base of the skull through the vertex, according to standard protocol, without contrast. A series of scans of the entire head are made without contrast and without previous films for comparison and show no evidence of intracranial mass or hemorrhage.  There is no shift of the midline structures; no significant atrophy is present.  The ventricular system is normal.  Bone windows show the paranasal sinuses, base of the skull, internal auditory canals, mastoids and bony calvarium to be intact. IMPRESSION: No evidence of acute intracranial finding.  Sinuses, skull base and bony calvarium are intact. It should be noted that acute ischemic infarcts may not be  detected on CT scan of the head without contrast.  Provider: Vicki Mallet   Psychological-Psychiatric  History: Depressed and Difficulty sleeping and or falling asleep Gastrointestinal History: Reflux or heatburn Genitourinary History: Passing kidney stones and Peeing blood Hematological History: No reported hematological signs or symptoms such as prolonged bleeding, low or poor functioning platelets, bruising or bleeding easily, hereditary bleeding problems, low energy levels due to low hemoglobin or being anemic Endocrine History: diabetes Rheumatologic History: No reported rheumatological signs and symptoms such as fatigue, joint pain, tenderness, swelling, redness, heat, stiffness, decreased range of motion, with or without associated rash Musculoskeletal History: Negative for myasthenia gravis, muscular dystrophy, multiple sclerosis or malignant hyperthermia Work History: Disabled  Allergies  Mr. Brandl is allergic to [place] [zolpidem]; lisinopril; and tramadol.  Laboratory Chemistry  Inflammation Markers No results found for: CRP, ESRSEDRATE (CRP: Acute Phase) (ESR: Chronic Phase) Renal Function [facility] Lab Results  Component Value Date   BUN 16 05/07/2017   CREATININE 1.02 05/07/2017   GFRAA >60 04/01/2017   GFRNONAA >60 04/01/2017   Hepatic Function Markers Lab Results  Component Value Date   AST 32 04/01/2017   ALT 38 04/01/2017   ALBUMIN 4.4 04/01/2017   ALKPHOS 70 04/01/2017   HCVAB NEGATIVE 11/15/2015   Electrolytes Lab Results  Component Value Date   NA 137 05/07/2017   K 4.6 05/07/2017   CL 106 05/07/2017   CALCIUM 9.9 05/07/2017   MG 2.0 09/14/2007   Neuropathy Markers No results found for: [id number] Bone Pathology Markers Lab Results  Component Value Date   ALKPHOS 70 04/01/2017   CALCIUM 9.9 05/07/2017   Coagulation Parameters Lab Results  Component Value Date   INR 0.98 04/01/2017   LABPROT 13.0 04/01/2017   APTT 28 04/01/2017   PLT 203.0 05/07/2017   Cardiovascular Markers Lab Results  Component Value Date   HGB 14.3 05/07/2017   HCT  43.0 05/07/2017   Note: Lab results reviewed.  PFSH  Drug: Mr. Renwick  reports that he does not use drugs. Alcohol:  reports that he does not drink alcohol. Tobacco:  reports that  has never smoked. He quit smokeless tobacco use about 19 months ago. His smokeless tobacco use included chew. Medical:  has a past medical history of Anginal pain ([place]), Basal cell carcinoma of face, Chest pain, unspecified, Coronary atherosclerosis of native coronary artery, Depression, Diabetes mellitus without mention of complication, Diverticulosis, Dyspnea, Dysrhythmia, Erectile dysfunction, Essential hypertension, benign, GERD (gastroesophageal reflux disease), Heart murmur, History of kidney stones, History of nephrolithiasis, Migraine, Myocardial infarction [facility]), Neuromuscular disorder ([place]), Osteoarthritis, Pneumonia, Postsurgical percutaneous transluminal coronary angioplasty status, Sarcoidosis, and Unspecified sleep apnea. Family: family history includes  Arthritis in his father and mother; Coronary artery disease in his father; Depression in his mother; Diabetes in his father; Stroke in his mother.  Past Surgical History:  Procedure Laterality Date  . ANTERIOR CERVICAL DECOMP/DISCECTOMY FUSION N/A 04/07/2017   Procedure: ANTERIOR CERVICAL DECOMPRESSION FUSION, CERVICAL 4-5 WITH INSTRUMENTATION AND ALLOGRAFT;  Surgeon: Phylliss Bob, MD;  Location: [place];  Service: Orthopedics;  Laterality: N/A;  ANTERIOR CERVICAL DECOMPRESSION FUSION, CERVICAL 4-5 WITH INSTRUMENTATION AND ALLOGRAFT; REQUEST 2.5 HOURS AND FLIP ROOM  . APPENDECTOMY  2005  . CARPAL TUNNEL RELEASE    . CERVICAL FUSION  2009  . CORONARY ANGIOPLASTY WITH STENT PLACEMENT    . CORONARY STENT INTERVENTION N/A 10/16/2016   Procedure: Coronary Stent Intervention;  Surgeon: Adrian Prows, MD;  Location: [place] CV LAB;  Service: Cardiovascular;  Laterality: N/A;  . ELBOW SURGERY     bilateral  . KNEE SURGERY     Bilateral   . LEFT HEART CATH AND  CORONARY ANGIOGRAPHY N/A 10/16/2016   Procedure: Left Heart Cath and Coronary Angiography;  Surgeon: Adrian Prows, MD;  Location: [place] CV LAB;  Service: Cardiovascular;  Laterality: N/A;  . LEFT HEART CATHETERIZATION WITH CORONARY ANGIOGRAM N/A 06/14/2014   Procedure: LEFT HEART CATHETERIZATION WITH CORONARY ANGIOGRAM;  Surgeon: Laverda Page, MD;  Location: [facility] CATH LAB;  Service: Cardiovascular;  Laterality: N/A;  . LUNG BIOPSY     sarcoid  . ROTATOR CUFF REPAIR     left   Active Ambulatory Problems    Diagnosis Date Noted  . ESSENTIAL HYPERTENSION, BENIGN 09/30/2010  . CORONARY ATHEROSCLEROSIS NATIVE CORONARY ARTERY 09/30/2010  . SLEEP APNEA 09/30/2010  . CHEST PAIN UNSPECIFIED 09/30/2010  . PERCUTANEOUS TRANSLUMINAL CORONARY ANGIOPLASTY, HX OF 09/30/2010  . 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 HYPERCHOLESTEROLEMIA 10/20/2010  . DEPRESSION 10/20/2010  . OSTEOARTHRITIS 10/20/2010  . ARTHRITIS 10/20/2010  . NEPHROLITHIASIS, HX OF 10/20/2010  . CHICKENPOX, HX OF 10/20/2010  . Neck pain 12/03/2010  . Hearing loss 12/03/2010  . Skin lesion of face 04/27/2011  . Cough 12/21/2011  . Tobacco abuse 12/21/2011  . Irritable mood 01/18/2012  . Pain in joint, ankle and foot 10/19/2012  . Medicare annual wellness visit, initial 02/17/2013  . Migraine, unspecified, without mention of intractable migraine without mention of status migrainosus 04/03/2013  . Benign paroxysmal positional vertigo 07/13/2013  . Orthostasis 07/13/2013  . Memory change 05/24/2014  . Atypical chest pain 06/06/2014  . NSTEMI (non-ST elevated myocardial infarction) ([place]) 06/13/2014  . Premature ejaculation 09/17/2014  . Heel pain 09/17/2014  . Radicular pain in right arm 04/05/2015  . Hand pain 04/05/2015  . Right sided sciatica 05/16/2015  . Plantar fasciitis 01/16/2016  . Sciatica 02/06/2016  . Dysphagia 06/12/2016  . Angina pectoris ([place]) 10/15/2016  . LLQ pain  10/21/2016  . Radiculopathy 04/07/2017  . Other fatigue 05/10/2017  . Other chronic pain 08/22/2017  . Cervical radiculitis 07/23/2016  . Chronic neck pain (Primary Area of Pain) (B) (L>R) 09/21/2017  . Chronic upper extremity pain (Secondary Area of Pain) (Bilateral) (L>R) 09/21/2017  . Chronic left shoulder pain (Tertiary Area of Pain) 09/21/2017  . Chronic low back pain (Fourth Area of Pain) (B) (R>L) 09/21/2017  . Chronic pain of lower extremity (B) (R>L) 09/21/2017  . Long term prescription opiate use 09/21/2017  . Disorder of bone, unspecified 09/21/2017  . Other long term (current) drug therapy 09/21/2017  . Other specified health status 09/21/2017   Resolved Ambulatory Problems  Diagnosis Date Noted  . Viral sinusitis 08/20/2015  . Boil 02/25/2017   Past Medical History:  Diagnosis Date  . Anginal pain ([place])   . Basal cell carcinoma of face   . Chest pain, unspecified   . Coronary atherosclerosis of native coronary artery   . Depression   . Diabetes mellitus without mention of complication   . Diverticulosis   . Dyspnea   . Dysrhythmia   . Erectile dysfunction   . Essential hypertension, benign   . GERD (gastroesophageal reflux disease)   . Heart murmur   . History of kidney stones   . History of nephrolithiasis   . Migraine   . Myocardial infarction ([place])   . Neuromuscular disorder ([place])   . Osteoarthritis   . Pneumonia   . Postsurgical percutaneous transluminal coronary angioplasty status   . Sarcoidosis   . Unspecified sleep apnea    Constitutional Exam  General appearance: Well nourished, well developed, and well hydrated. In no apparent acute distress Vitals:   09/21/17 1045  BP: 135/75  Pulse: 73  Resp: 16  Temp: 97.7 F (36.5 C)  TempSrc: Oral  SpO2: 100%  Weight: 215 lb (97.5 kg)  Height: _0  (1.778 m)   BMI Assessment: Estimated body mass index is 30.85 kg/m as calculated from the following:   Height as of this encounter: _1   (1.778 m).   Weight as of this encounter: 215 lb (97.5 kg).  BMI interpretation table: BMI level Category Range association with higher incidence of chronic pain  <18 kg/m2 Underweight   18.5-24.9 kg/m2 Ideal body weight   25-29.9 kg/m2 Overweight Increased incidence by 20%  30-34.9 kg/m2 Obese (Class I) Increased incidence by 68%  35-39.9 kg/m2 Severe obesity (Class II) Increased incidence by 136%  >40 kg/m2 Extreme obesity (Class III) Increased incidence by 254%   BMI Readings from Last 4 Encounters:  09/21/17 30.85 kg/m  08/20/17 30.34 kg/m  05/07/17 30.55 kg/m  04/07/17 30.41 kg/m   Wt Readings from Last 4 Encounters:  09/21/17 215 lb (97.5 kg)  08/20/17 214 lb 8 oz (97.3 kg)  05/07/17 216 lb (98 kg)  04/07/17 215 lb (97.5 kg)  Psych/Mental status: Alert, oriented x 3 (person, place, & time)       Eyes: PERLA Respiratory: No evidence of acute respiratory distress  Cervical Spine Exam  Inspection: Well healed scar from previous spine surgery detected Alignment: Symmetrical Functional ROM: Unrestricted ROM      Stability: No instability detected Muscle strength & Tone: Functionally intact Sensory: Unimpaired Palpation: No palpable anomalies              Upper Extremity (UE) Exam    Side: Right upper extremity  Side: Left upper extremity  Inspection: No masses, redness, swelling, or asymmetry. No contractures  Inspection: No masses, redness, swelling, or asymmetry. No contractures  Functional ROM: Adequate ROM          Functional ROM: Adequate ROM          Muscle strength & Tone: Movement possible against some resistance (4/5)  Muscle strength & Tone: Movement possible against some resistance (4/5)  Sensory: Unimpaired  Sensory: Unimpaired  Palpation: No palpable anomalies              Palpation: No palpable anomalies              Specialized Test(s): Deferred         Specialized Test(s): Deferred  Thoracic Spine Exam  Inspection: No masses, redness, or  swelling Alignment: Symmetrical Functional ROM: Unrestricted ROM Stability: No instability detected Sensory: Unimpaired Muscle strength & Tone: No palpable anomalies  Lumbar Spine Exam  Inspection: No masses, redness, or swelling Alignment: Symmetrical Functional ROM: Unrestricted ROM      Stability: No instability detected Muscle strength & Tone: Functionally intact Sensory: Unimpaired Palpation: Non-tender       Provocative Tests: Lumbar Hyperextension and rotation test: Negative      leg raises right positive at 45 Patrick's Maneuver: Negative                    Gait & Posture Assessment  Ambulation: Unassisted Gait: Relatively normal for age and body habitus Posture: WNL   Lower Extremity Exam    Side: Right lower extremity  Side: Left lower extremity  Inspection: No masses, redness, swelling, or asymmetry. No contractures  Inspection: No masses, redness, swelling, or asymmetry. No contractures  Functional ROM: Unrestricted ROM          Functional ROM: Unrestricted ROM          Muscle strength & Tone: Able to Toe-walk & Heel-walk without problems  Muscle strength & Tone: Able to Toe-walk & Heel-walk without problems  Sensory: Unimpaired  Sensory: Unimpaired  Palpation: No palpable anomalies  Palpation: No palpable anomalies   Assessment  Primary Diagnosis & Pertinent Problem List: The primary encounter diagnosis was Chronic neck pain (Primary Area of Pain) (B) (L>R). Diagnoses of Chronic pain of both upper extremities, Chronic left shoulder pain (Tertiary Area of Pain), Chronic bilateral low back pain with bilateral sciatica, Chronic pain of both lower extremities, Long term prescription opiate use, Disorder of bone, unspecified, Other long term (current) drug therapy, Other specified health status, and Chronic pain syndrome were also pertinent to this visit.  Visit Diagnosis: 1. Chronic neck pain (Primary Area of Pain) (B) (L>R)   2. Chronic pain of both upper extremities    3. Chronic left shoulder pain (Tertiary Area of Pain)   4. Chronic bilateral low back pain with bilateral sciatica   5. Chronic pain of both lower extremities   6. Long term prescription opiate use   7. Disorder of bone, unspecified   8. Other long term (current) drug therapy   9. Other specified health status   10. Chronic pain syndrome    Plan of Care  Initial treatment plan:  Please be advised that as per protocol, today's visit has been an evaluation only. We have not taken over the patient's controlled substance management.  Problem-specific plan: No problem-specific Assessment & Plan notes found for this encounter.  Ordered Lab-work, Procedure(s), Referral(s), & Consult(s): Orders Placed This Encounter  Procedures  . Compliance Drug Analysis, Ur  . Comp. Metabolic Panel (12)  . Magnesium  . Vitamin B12  . Sedimentation rate  . 25-Hydroxyvitamin D Lcms D2+D3  . C-reactive protein  . Ambulatory referral to Psychology   Pharmacotherapy: Medications ordered:  No orders of the defined types were placed in this encounter.  Medications administered during this visit: Tahjir Silveria. Clevinger had no medications administered during this visit.   Pharmacotherapy under consideration:  Opioid Analgesics: The patient was informed that there is no guarantee that he would be a candidate for opioid analgesics. The decision will be made following CDC guidelines. This decision will be based on the results of diagnostic studies, as well as Mr. Alcock risk profile.  Membrane stabilizer: To be determined at a  later time Muscle relaxant: To be determined at a later time NSAID: To be determined at a later time Other analgesic(s): To be determined at a later time   Interventional therapies under consideration: Mr. Swindle was informed that there is no guarantee that he would be a candidate for interventional therapies. The decision will be based on the results of diagnostic studies, as well as  Mr. Molner risk profile.  Possible procedure(s): Diagnostic bilateral cervical epidural steroid injection Diagnostic left-sided cervical facet nerve block Possible left-sided cervical facet RFA Diagnostic Left intra-articular shoulder injection Diagnostic left suprascapular nerve block  Possible left suprascapular RFA Diagnostic bilateral transforaminal epidural steroid injection Diagnostic bilateral epidural steroid injection Diagnostic bilateral lumbar facet nerve block Possible bilateral lumbar facet RFA    Provider-requested follow-up: Return for 2nd Visit, w/ Dr. Dossie Arbour, after [facility] eval.  Future Appointments  Date Time Provider [place]  09/29/2017  9:00 AM Velora Heckler [facility]  10/11/2017 11:15 AM Allyne Gee, MD [place] None    Primary Care Physician: Tonia Ghent, MD Location: [facility] Note by:  Date: 09/21/2017; Time: 3:58 PM  Pain Score Disclaimer: We use the NRS-11 scale. This is a self-reported, subjective measurement of pain severity with only modest accuracy. It is used primarily to identify changes within a particular patient. It must be understood that outpatient pain scales are significantly less accurate that those used for research, where they can be applied under ideal controlled circumstances with minimal exposure to variables. In reality, the score is likely to be a combination of pain intensity and pain affect, where pain affect describes the degree of emotional arousal or changes in action readiness caused by the sensory experience of pain. Factors such as social and work situation, setting, emotional state, anxiety levels, expectation, and prior pain experience may influence pain perception and show large inter-individual differences that may also be affected by time variables.  Patient instructions provided during this appointment: Patient Instructions     ____________________________________________________________________________________________  Appointment Policy Summary  It is our goal and responsibility to provide the medical community with assistance in the evaluation and management of patients with chronic pain. Unfortunately our resources are limited. Because we do not have an unlimited amount of time, or available appointments, we are required to closely monitor and manage their use. The following rules exist to maximize their use:  Patient's responsibilities: 1. Punctuality:  At what time should I arrive? You should be physically present in our office 30 minutes before your scheduled appointment. Your scheduled appointment is with your assigned healthcare provider. However, it takes 5-10 minutes to be "checked-in", and another 15 minutes for the nurses to do the admission. If you arrive to our office at the time you were given for your appointment, you will end up being at least 20-25 minutes late to your appointment with the provider. 2. Tardiness:  What happens if I arrive only a few minutes after my scheduled appointment time? You will need to reschedule your appointment. The cutoff is your appointment time. This is why it is so important that you arrive at least 30 minutes before that appointment. If you have an appointment scheduled for 10:00 AM and you arrive at 10:01, you will be required to reschedule your appointment.  3. Plan ahead:  Always assume that you will encounter traffic on your way in. Plan for it. If you are dependent on a driver, make sure they understand these rules and the need to arrive early.  4. Other appointments and responsibilities:  Avoid scheduling any other appointments before or after your pain clinic appointments.  5. Be prepared:  Write down everything that you need to discuss with your healthcare provider and give this information to the admitting nurse. Write down the medications that you will need  refilled. Bring your pills and bottles (even the empty ones), to all of your appointments, except for those where a procedure is scheduled. 6. No children or pets:  Find someone to take care of them. It is not appropriate to bring them in. 7. Scheduling changes:  We request "advanced notification" of any changes or cancellations. 8. Advanced notification:  Defined as a time period of more than 24 hours prior to the originally scheduled appointment. This allows for the appointment to be offered to other patients. 9. Rescheduling:  When a visit is rescheduled, it will require the cancellation of the original appointment. For this reason they both fall within the category of "Cancellations".  10. Cancellations:  They require advanced notification. Any cancellation less than 24 hours before the  appointment will be recorded as a "No Show". 11. No Show:  Defined as an unkept appointment where the patient failed to notify or declare to the practice their intention or inability to keep the appointment.  Corrective process for repeat offenders:  1. Tardiness: Three (3) episodes of rescheduling due to late arrivals will be recorded as one (1) "No Show". 2. Cancellation or reschedule: Three (3) cancellations or rescheduling will be recorded as one (1) "No Show". 3. "No Shows": Three (3) "No Shows" within a 12 month period will result in discharge from the practice.  ____________________________________________________________________________________________   ____________________________________________________________________________________________  Pain Scale  Introduction: The pain score used by this practice is the Verbal Numerical Rating Scale (VNRS-11). This is an 11-point scale. It is for adults and children 10 years or older. There are significant differences in how the pain score is reported, used, and applied. Forget everything you learned in the past and learn this scoring  system.  General Information: The scale should reflect your current level of pain. Unless you are specifically asked for the level of your worst pain, or your average pain. If you are asked for one of these two, then it should be understood that it is over the past 24 hours.  Basic Activities of Daily Living (ADL): Personal hygiene, dressing, eating, transferring, and using restroom.  Instructions: Most patients tend to report their level of pain as a combination of two factors, their physical pain and their psychosocial pain. This last one is also known as "suffering" and it is reflection of how physical pain affects you socially and psychologically. From now on, report them separately. From this point on, when asked to report your pain level, report only your physical pain. Use the following table for reference.  Pain Clinic Pain Levels (0-5/10)  Pain Level Score  Description  No Pain 0   Mild pain 1 Nagging, annoying, but does not interfere with basic activities of daily living (ADL). Patients are able to eat, bathe, get dressed, toileting (being able to get on and off the toilet and perform personal hygiene functions), transfer (move in and out of bed or a chair without assistance), and maintain continence (able to control bladder and bowel functions). Blood pressure and heart rate are unaffected. A normal heart rate for a healthy adult ranges from 60 to 100 bpm (beats per minute).   Mild to moderate pain 2 Noticeable and distracting.  Impossible to hide from other people. More frequent flare-ups. Still possible to adapt and function close to normal. It can be very annoying and may have occasional stronger flare-ups. With discipline, patients may get used to it and adapt.   Moderate pain 3 Interferes significantly with activities of daily living (ADL). It becomes difficult to feed, bathe, get dressed, get on and off the toilet or to perform personal hygiene functions. Difficult to get in and out of  bed or a chair without assistance. Very distracting. With effort, it can be ignored when deeply involved in activities.   Moderately severe pain 4 Impossible to ignore for more than a few minutes. With effort, patients may still be able to manage work or participate in some social activities. Very difficult to concentrate. Signs of autonomic nervous system discharge are evident: dilated pupils (mydriasis); mild sweating (diaphoresis); sleep interference. Heart rate becomes elevated (>115 bpm). Diastolic blood pressure (lower number) rises above 100 mmHg. Patients find relief in laying down and not moving.   Severe pain 5 Intense and extremely unpleasant. Associated with frowning face and frequent crying. Pain overwhelms the senses.  Ability to do any activity or maintain social relationships becomes significantly limited. Conversation becomes difficult. Pacing back and forth is common, as getting into a comfortable position is nearly impossible. Pain wakes you up from deep sleep. Physical signs will be obvious: pupillary dilation; increased sweating; goosebumps; brisk reflexes; cold, clammy hands and feet; nausea, vomiting or dry heaves; loss of appetite; significant sleep disturbance with inability to fall asleep or to remain asleep. When persistent, significant weight loss is observed due to the complete loss of appetite and sleep deprivation.  Blood pressure and heart rate becomes significantly elevated. Caution: If elevated blood pressure triggers a pounding headache associated with blurred vision, then the patient should immediately seek attention at an urgent or emergency care unit, as these may be signs of an impending stroke.    Emergency Department Pain Levels (6-10/10)  Emergency Room Pain 6 Severely limiting. Requires emergency care and should not be seen or managed at an outpatient pain management facility. Communication becomes difficult and requires great effort. Assistance to reach the  emergency department may be required. Facial flushing and profuse sweating along with potentially dangerous increases in heart rate and blood pressure will be evident.   Distressing pain 7 Self-care is very difficult. Assistance is required to transport, or use restroom. Assistance to reach the emergency department will be required. Tasks requiring coordination, such as bathing and getting dressed become very difficult.   Disabling pain 8 Self-care is no longer possible. At this level, pain is disabling. The individual is unable to do even the most "basic" activities such as walking, eating, bathing, dressing, transferring to a bed, or toileting. Fine motor skills are lost. It is difficult to think clearly.   Incapacitating pain 9 Pain becomes incapacitating. Thought processing is no longer possible. Difficult to remember your own name. Control of movement and coordination are lost.   The worst pain imaginable 10 At this level, most patients pass out from pain. When this level is reached, collapse of the autonomic nervous system occurs, leading to a sudden drop in blood pressure and heart rate. This in turn results in a temporary and dramatic drop in blood flow to the brain, leading to a loss of consciousness. Fainting is one of the body's self defense mechanisms. Passing out puts the brain in a calmed state and causes it to shut down for a  while, in order to begin the healing process.    Summary: 1. Refer to this scale when providing [place] with your pain level. 2. Be accurate and careful when reporting your pain level. This will help with your care. 3. Over-reporting your pain level will lead to loss of credibility. 4. Even a level of 1/10 means that there is pain and will be treated at our facility. 5. High, inaccurate reporting will be documented as "Symptom Exaggeration", leading to loss of credibility and suspicions of possible secondary gains such as obtaining more narcotics, or wanting to appear  disabled, for fraudulent reasons. 6. Only pain levels of 5 or below will be seen at our facility. 7. Pain levels of 6 and above will be sent to the Emergency Department and the appointment cancelled. ____________________________________________________________________________________________

## 2017-09-21 NOTE — Patient Instructions (Signed)

## 2017-09-21 NOTE — Progress Notes (Signed)
Safety precautions to be maintained throughout the outpatient stay will include: orient to surroundings, keep bed in low position, maintain call bell within reach at all times, provide assistance with transfer out of bed and ambulation.  

## 2017-09-25 LAB — COMP. METABOLIC PANEL (12)
AST: 26 IU/L (ref 0–40)
Albumin/Globulin Ratio: 1.7 (ref 1.2–2.2)
Albumin: 4.6 g/dL (ref 3.5–5.5)
Alkaline Phosphatase: 95 IU/L (ref 39–117)
BUN/Creatinine Ratio: 12 (ref 9–20)
BUN: 17 mg/dL (ref 6–24)
Bilirubin Total: 0.3 mg/dL (ref 0.0–1.2)
Calcium: 10.2 mg/dL (ref 8.7–10.2)
Chloride: 100 mmol/L (ref 96–106)
Creatinine, Ser: 1.4 mg/dL — ABNORMAL HIGH (ref 0.76–1.27)
GFR calc Af Amer: 64 mL/min/{1.73_m2} (ref >59)
GFR calc non Af Amer: 55 mL/min/{1.73_m2} — ABNORMAL LOW (ref >59)
Globulin, Total: 2.7 g/dL (ref 1.5–4.5)
Glucose: 335 mg/dL — ABNORMAL HIGH (ref 65–99)
Potassium: 4.9 mmol/L (ref 3.5–5.2)
Sodium: 138 mmol/L (ref 134–144)
Total Protein: 7.3 g/dL (ref 6.0–8.5)

## 2017-09-25 LAB — MAGNESIUM: Magnesium: 1.4 mg/dL — ABNORMAL LOW (ref 1.6–2.3)

## 2017-09-25 LAB — SEDIMENTATION RATE: Sed Rate: 23 mm/hr (ref 0–30)

## 2017-09-25 LAB — VITAMIN B12: Vitamin B-12: 368 pg/mL (ref 232–1245)

## 2017-09-25 LAB — C-REACTIVE PROTEIN: CRP: 0.9 mg/L (ref 0.0–4.9)

## 2017-09-25 LAB — 25-HYDROXY VITAMIN D LCMS D2+D3
25-Hydroxy, Vitamin D-2: <1 ng/mL
25-Hydroxy, Vitamin D-3: 16 ng/mL
25-Hydroxy, Vitamin D: 17 ng/mL — ABNORMAL LOW

## 2017-09-26 ENCOUNTER — Other Ambulatory Visit: Payer: Self-pay | Admitting: Family Medicine

## 2017-09-27 NOTE — Telephone Encounter (Signed)
Electronic refill request Last office visit 08/20/17 Last lipid lab work 05/12/16

## 2017-09-28 ENCOUNTER — Other Ambulatory Visit: Payer: Self-pay | Admitting: Family Medicine

## 2017-09-28 LAB — COMPLIANCE DRUG ANALYSIS, UR

## 2017-09-28 NOTE — Telephone Encounter (Signed)
Sent. Thanks.   

## 2017-09-29 ENCOUNTER — Ambulatory Visit: Payer: Medicare HMO | Admitting: Orthotics

## 2017-09-29 DIAGNOSIS — M2042 Other hammer toe(s) (acquired), left foot: Principal | ICD-10-CM

## 2017-09-29 DIAGNOSIS — E1149 Type 2 diabetes mellitus with other diabetic neurological complication: Secondary | ICD-10-CM

## 2017-09-29 DIAGNOSIS — M2041 Other hammer toe(s) (acquired), right foot: Secondary | ICD-10-CM

## 2017-09-29 DIAGNOSIS — L03119 Cellulitis of unspecified part of limb: Secondary | ICD-10-CM

## 2017-09-29 DIAGNOSIS — L02619 Cutaneous abscess of unspecified foot: Secondary | ICD-10-CM

## 2017-09-29 NOTE — Progress Notes (Signed)
Patient came in today for fitting and eval for diabetic shoes: Patient' doctor here is Forensic psychologist Patient presents with DM2, PN, HT b/l  Patient was measured with brannock 10.5 W device and cast in foam for custom inserts.  Patient chose [facility] 887 11.5 xw

## 2017-09-30 ENCOUNTER — Other Ambulatory Visit: Payer: Self-pay

## 2017-10-04 ENCOUNTER — Encounter: Payer: Self-pay | Admitting: Nurse Practitioner

## 2017-10-04 DIAGNOSIS — E559 Vitamin D deficiency, unspecified: Secondary | ICD-10-CM | POA: Insufficient documentation

## 2017-10-05 DIAGNOSIS — R69 Illness, unspecified: Secondary | ICD-10-CM | POA: Diagnosis not present

## 2017-10-05 DIAGNOSIS — H5212 Myopia, left eye: Secondary | ICD-10-CM | POA: Diagnosis not present

## 2017-10-05 DIAGNOSIS — R29818 Other symptoms and signs involving the nervous system: Secondary | ICD-10-CM | POA: Diagnosis not present

## 2017-10-06 ENCOUNTER — Other Ambulatory Visit: Payer: Self-pay | Admitting: Family Medicine

## 2017-10-07 ENCOUNTER — Telehealth: Payer: Self-pay | Admitting: *Deleted

## 2017-10-07 NOTE — Telephone Encounter (Signed)
Patient informed of lab results and instructed to pick up prescription.

## 2017-10-07 NOTE — Telephone Encounter (Signed)
-----   Message from Vevelyn Francois, NP sent at 10/07/2017 10:16 AM EST ----- Regarding: vitamin D deficiency Please call and inform pt of Vit D level 17; Vitamin D Def.  I will be sending in a Rx for Vit D 50000 units weekly x12. He will then start daily Vit D of 2000 units Thanks

## 2017-10-11 ENCOUNTER — Telehealth: Payer: Self-pay | Admitting: Nurse Practitioner

## 2017-10-11 ENCOUNTER — Encounter: Payer: Self-pay | Admitting: Internal Medicine

## 2017-10-11 ENCOUNTER — Ambulatory Visit: Payer: Medicare HMO | Admitting: Internal Medicine

## 2017-10-11 ENCOUNTER — Other Ambulatory Visit: Payer: Self-pay | Admitting: Nurse Practitioner

## 2017-10-11 VITALS — BP 144/68 | HR 65 | Resp 16 | Ht 70.0 in | Wt 216.4 lb

## 2017-10-11 DIAGNOSIS — Z9989 Dependence on other enabling machines and devices: Secondary | ICD-10-CM

## 2017-10-11 DIAGNOSIS — I2583 Coronary atherosclerosis due to lipid rich plaque: Secondary | ICD-10-CM | POA: Diagnosis not present

## 2017-10-11 DIAGNOSIS — I2510 Atherosclerotic heart disease of native coronary artery without angina pectoris: Secondary | ICD-10-CM

## 2017-10-11 DIAGNOSIS — G4733 Obstructive sleep apnea (adult) (pediatric): Secondary | ICD-10-CM

## 2017-10-11 MED ORDER — ERGOCALCIFEROL 1.25 MG (50000 UT) PO CAPS: 50000.0000 [IU] | ORAL_CAPSULE | ORAL | 0 refills | Status: AC

## 2017-10-11 NOTE — Telephone Encounter (Signed)
Patient notified prescription has been sent.

## 2017-10-11 NOTE — Telephone Encounter (Signed)
Crystal, Please sent this prescription in.

## 2017-10-11 NOTE — Telephone Encounter (Signed)
sent 

## 2017-10-11 NOTE — Telephone Encounter (Signed)
Says he received phone call about script being called in for Vitamin D, has checked with pharmacy and they havent received. Please call patient

## 2017-10-11 NOTE — Progress Notes (Signed)
[facility] [place], [place] [address]  Pulmonary Sleep Medicine  Office Visit Note  Patient Name: Tony Manning DOB: 19-Nov-1960 MRN [id number]  Date of Service: 10/11/2017  Complaints/HPI: On CPAP therapy doing well. Patient states he is using the device nightly. Patient has no issues with side effects from the [facility]. He has some fatigue related to his Vit D levels. He gets about 6 hours of sleep he does not work. Patient has chronic pain also and feels tired from this too.  ROS  General: (-) fever, (-) chills, (-) night sweats, (-) weakness Skin: (-) rashes, (-) itching,. Eyes: (-) visual changes, (-) redness, (-) itching. Nose and Sinuses: (-) nasal stuffiness or itchiness, (-) postnasal drip, (-) nosebleeds, (-) sinus trouble. Mouth and Throat: (-) sore throat, (-) hoarseness. Neck: (-) swollen glands, (-) enlarged thyroid, (-) neck pain. Respiratory: - cough, (-) bloody sputum, - shortness of breath, - wheezing. Cardiovascular: - ankle swelling, (-) chest pain. Lymphatic: (-) lymph node enlargement. Neurologic: (-) numbness, (-) tingling. Psychiatric: (-) anxiety, (-) depression   Current Medication: Outpatient Encounter Medications as of 10/11/2017  Medication Sig  . aspirin 81 MG chewable tablet Chew 81 mg by mouth at bedtime.  [name] Kitchen atorvastatin (LIPITOR) 40 MG tablet TAKE 1 TABLET (40 MG TOTAL) BY MOUTH DAILY.  . carvedilol (COREG) 6.25 MG tablet Take 6.25 mg by mouth 2 (two) times daily with a meal.  . clopidogrel (PLAVIX) 75 MG tablet Take by mouth.  . diclofenac sodium (VOLTAREN) 1 % GEL Apply 2-4 g topically 4 (four) times daily.  . DULoxetine (CYMBALTA) 60 MG capsule Take 1 capsule (60 mg total) by mouth daily.  [name] Kitchen glimepiride (AMARYL) 4 MG tablet TAKE 1 TABLET BY MOUTH TWICE A DAY  . glucose blood (ONE TOUCH ULTRA TEST) test strip Check sugar up to 6 times a day.  DM2, insulin treated, uncontrolled.  . Insulin Pen Needle 31G X 4 MM MISC  1 Device by Does not apply route daily. Use daily with insulin needle  . LEVEMIR FLEXTOUCH 100 UNIT/ML Pen INJECT 40 UNITS SUBQ EVERY AM. BS > 120: ADD 1 UNIT, BS <80: SUBTRACT 1 UNIT. BS 80-120: NO CHANGE  . losartan (COZAAR) 100 MG tablet Take 0.5 tablets (50 mg total) by mouth 2 (two) times daily.  . metFORMIN (GLUCOPHAGE) 1000 MG tablet TAKE 1 TABLET BY MOUTH TWICE A DAY WITH A MEAL  . nitroGLYCERIN (NITROSTAT) 0.4 MG SL tablet Place 1 tablet (0.4 mg total) under the tongue every 5 (five) minutes as needed for chest pain.  . ranitidine (ZANTAC) 75 MG tablet Take 75 mg by mouth daily.  . tamsulosin (FLOMAX) 0.4 MG CAPS capsule TAKE 1 CAPSULE (0.4 MG TOTAL) BY MOUTH DAILY.   No facility-administered encounter medications on file as of 10/11/2017.     Surgical History: Past Surgical History:  Procedure Laterality Date  . ANTERIOR CERVICAL DECOMP/DISCECTOMY FUSION N/A 04/07/2017   Procedure: ANTERIOR CERVICAL DECOMPRESSION FUSION, CERVICAL 4-5 WITH INSTRUMENTATION AND ALLOGRAFT;  Surgeon: Phylliss Bob, MD;  Location: [facility];  Service: Orthopedics;  Laterality: N/A;  ANTERIOR CERVICAL DECOMPRESSION FUSION, CERVICAL 4-5 WITH INSTRUMENTATION AND ALLOGRAFT; REQUEST 2.5 HOURS AND FLIP ROOM  . APPENDECTOMY  2005  . CARPAL TUNNEL RELEASE    . CERVICAL FUSION  2009  . CORONARY ANGIOPLASTY WITH STENT PLACEMENT    . CORONARY STENT INTERVENTION N/A 10/16/2016   Procedure: Coronary Stent Intervention;  Surgeon: Adrian Prows, MD;  Location: [place];  Service: Cardiovascular;  Laterality: N/A;  . ELBOW SURGERY     bilateral  . KNEE SURGERY     Bilateral   . LEFT HEART CATH AND CORONARY ANGIOGRAPHY N/A 10/16/2016   Procedure: Left Heart Cath and Coronary Angiography;  Surgeon: Adrian Prows, MD;  Location: [place] [facility];  Service: Cardiovascular;  Laterality: N/A;  . LEFT HEART CATHETERIZATION WITH CORONARY ANGIOGRAM N/A 06/14/2014   Procedure: LEFT HEART CATHETERIZATION WITH CORONARY ANGIOGRAM;   Surgeon: Laverda Page, MD;  Location: [facility] CATH LAB;  Service: Cardiovascular;  Laterality: N/A;  . LUNG BIOPSY     sarcoid  . ROTATOR CUFF REPAIR     left    Medical History: Past Medical History:  Diagnosis Date  . Anginal pain ([place])   . Basal cell carcinoma of face   . Chest pain, unspecified   . Coronary atherosclerosis of native coronary artery    s/p stent x4  . Depression   . Diabetes mellitus without mention of complication   . Diverticulosis   . Dyspnea   . Dysrhythmia   . Erectile dysfunction   . Essential hypertension, benign   . GERD (gastroesophageal reflux disease)   . Heart murmur   . History of kidney stones   . History of nephrolithiasis   . Migraine   . Myocardial infarction ([place])   . Neuromuscular disorder ([place])   . Osteoarthritis   . Pneumonia   . Postsurgical percutaneous transluminal coronary angioplasty status    s/p stent x4  . Sarcoidosis   . Unspecified sleep apnea     Family History: Family History  Problem Relation Age of Onset  . Arthritis Mother   . Depression Mother   . Stroke Mother   . Arthritis Father   . Diabetes Father   . Coronary artery disease Father   . Colon cancer Neg Hx   . Prostate cancer Neg Hx   . Esophageal cancer Neg Hx   . Rectal cancer Neg Hx   . Stomach cancer Neg Hx     Social History: Social History   Socioeconomic History  . Marital status: Married    Spouse name: Not on file  . Number of children: 2  . Years of education: Not on file  . Highest education level: Not on file  Social Needs  . Financial resource strain: Not on file  . Food insecurity - worry: Not on file  . Food insecurity - inability: Not on file  . Transportation needs - medical: Not on file  . Transportation needs - non-medical: Not on file  Occupational History  . Occupation: disabled    Fish farm manager: unemployed  Tobacco Use  . Smoking status: Never Smoker  . Smokeless tobacco: Former Systems developer    Types: Chew  . Tobacco  comment: trying to quit  Substance and Sexual Activity  . Alcohol use: No    Alcohol/week: 0.0 oz  . Drug use: No  . Sexual activity: Yes    Birth control/protection: Condom  Other Topics Concern  . Not on file  Social History Narrative   Married, 1987   2 kids   On disability    Vital Signs: Blood pressure (!) 144/68, pulse 65, resp. rate 16, height 5\' 10"  (1.778 m), weight 216 lb 6.4 oz (98.2 kg), SpO2 97 %.  Examination: General Appearance: The patient is well-developed, well-nourished, and in no distress. Skin: Gross inspection of skin unremarkable. Head: normocephalic, no gross deformities. Eyes: no gross deformities noted. ENT: ears  appear grossly normal no exudates. Neck: Supple. No thyromegaly. No LAD. Respiratory: clear. Cardiovascular: Normal S1 and S2 without murmur or rub. Extremities: No cyanosis. pulses are equal. Neurologic: Alert and oriented. No involuntary movements.  LABS: Recent Results (from the past 2160 hour(s))  Hemoglobin A1c     Status: Abnormal   Collection Time: 08/20/17  8:49 AM  Result Value Ref Range   Hgb A1c MFr Bld 7.7 (H) 4.6 - 6.5 %    Comment: Glycemic Control Guidelines for People with Diabetes:Non Diabetic:  <6%Goal of Therapy: <7%Additional Action Suggested:  >8%   Comp. Metabolic Panel (12)     Status: Abnormal   Collection Time: 09/21/17 11:56 AM  Result Value Ref Range   Glucose 335 (H) 65 - 99 mg/dL   BUN 17 6 - 24 mg/dL   Creatinine, Ser 1.40 (H) 0.76 - 1.27 mg/dL   GFR calc non Af Amer 55 (L) >59 mL/min/1.73   GFR calc Af Amer 64 >59 mL/min/1.73   BUN/Creatinine Ratio 12 9 - 20   Sodium 138 134 - 144 mmol/L   Potassium 4.9 3.5 - 5.2 mmol/L   Chloride 100 96 - 106 mmol/L   Calcium 10.2 8.7 - 10.2 mg/dL   Total Protein 7.3 6.0 - 8.5 g/dL   Albumin 4.6 3.5 - 5.5 g/dL   Globulin, Total 2.7 1.5 - 4.5 g/dL   Albumin/Globulin Ratio 1.7 1.2 - 2.2   Bilirubin Total 0.3 0.0 - 1.2 mg/dL   Alkaline Phosphatase 95 39 - 117 IU/L    AST 26 0 - 40 IU/L  Magnesium     Status: Abnormal   Collection Time: 09/21/17 11:56 AM  Result Value Ref Range   Magnesium 1.4 (L) 1.6 - 2.3 mg/dL  Vitamin B12     Status: None   Collection Time: 09/21/17 11:56 AM  Result Value Ref Range   Vitamin B-12 368 232 - 1,245 pg/mL  Sedimentation rate     Status: None   Collection Time: 09/21/17 11:56 AM  Result Value Ref Range   Sed Rate 23 0 - 30 mm/hr  25-Hydroxyvitamin D Lcms D2+D3     Status: Abnormal   Collection Time: 09/21/17 11:56 AM  Result Value Ref Range   25-Hydroxy, Vitamin D 17 (L) ng/mL    Comment: Reference Range: All Ages: Target levels 30 - 100    25-Hydroxy, Vitamin D-2 <1.0 ng/mL   25-Hydroxy, Vitamin D-3 16 ng/mL  C-reactive protein     Status: None   Collection Time: 09/21/17 11:56 AM  Result Value Ref Range   CRP 0.9 0.0 - 4.9 mg/L  Compliance Drug Analysis, Ur     Status: None   Collection Time: 09/21/17 12:04 PM  Result Value Ref Range   Summary FINAL     Comment: ==================================================================== TOXASSURE COMP DRUG ANALYSIS,UR ==================================================================== Test                             Result       Flag       Units Drug Present and Declared for Prescription Verification   Duloxetine                     PRESENT      EXPECTED Drug Present not Declared for Prescription Verification   Ephedrine/Pseudoephedrine      PRESENT      UNEXPECTED    Source of ephedrine/pseudoephedrine is most commonly  pseudoephedrine in over-the-counter or prescription cold and    allergy medications. Drug Absent but Declared for Prescription Verification   Diclofenac                     Not Detected UNEXPECTED    Diclofenac, as indicated in the declared medication list, is not    always detected even when used as directed.   Salicylate                     Not Detected UNEXPECTED    Aspirin, as indicated in the declared medication list, is not     always detected even when used  as directed. ==================================================================== Test                      Result    Flag   Units      Ref Range   Creatinine              99               mg/dL      >=20 ==================================================================== Declared Medications:  The flagging and interpretation on this report are based on the  following declared medications.  Unexpected results may arise from  inaccuracies in the declared medications.  **Note: The testing scope of this panel includes these medications:  Duloxetine  **Note: The testing scope of this panel does not include small to  moderate amounts of these reported medications:  Aspirin  Diclofenac  **Note: The testing scope of this panel does not include following  reported medications:  Atorvastatin  Carvedilol  Clopidogrel  Glimepiride  Insulin (Levemir)  Losartan (Losartan Potassium)  Metformin  Nitroglycerin  Ranitidine  Tamsulosin ======================================= ============================= For clinical consultation, please call [phone]. ====================================================================     Radiology: Dg Chest 2 View  Result Date: 04/01/2017 CLINICAL DATA:  Spine surgery.  Preoperative exam . EXAM: CHEST  2 VIEW COMPARISON:  Chest x-ray 09/29/2010. FINDINGS: Mediastinum and hilar structures normal. Surgical sutures right mid lung with postsurgical changes in the right lung base. No pleural effusion or pneumothorax. Prior cervical spine fusion. IMPRESSION: Postsurgical changes right lung base. No acute cardiopulmonary disease . Electronically Signed   By: Marcello Moores  Register   On: 04/01/2017 09:05    No results found.  Dg Foot Complete Left  Result Date: 09/14/2017 Please see detailed radiograph report in office note.     Assessment and Plan: Patient Active Problem List   Diagnosis Date Noted  . Vitamin D  deficiency, unspecified 10/04/2017  . Chronic neck pain (Primary Area of Pain) (B) (L>R) 09/21/2017  . Chronic upper extremity pain (Secondary Area of Pain) (Bilateral) (L>R) 09/21/2017  . Chronic left shoulder pain (Tertiary Area of Pain) 09/21/2017  . Chronic low back pain (Fourth Area of Pain) (B) (R>L) 09/21/2017  . Chronic pain of lower extremity (B) (R>L) 09/21/2017  . Long term prescription opiate use 09/21/2017  . Disorder of bone, unspecified 09/21/2017  . Other long term (current) drug therapy 09/21/2017  . Other specified health status 09/21/2017  . Other chronic pain 08/22/2017  . Other fatigue 05/10/2017  . Radiculopathy 04/07/2017  . LLQ pain 10/21/2016  . Angina pectoris ([place]) 10/15/2016  . Cervical radiculitis 07/23/2016  . Dysphagia 06/12/2016  . Sciatica 02/06/2016  . Plantar fasciitis 01/16/2016  . Right sided sciatica 05/16/2015  . Radicular pain in right arm 04/05/2015  . Hand pain 04/05/2015  . Premature ejaculation 09/17/2014  .  Heel pain 09/17/2014  . NSTEMI (non-ST elevated myocardial infarction) ([place]) 06/13/2014  . Atypical chest pain 06/06/2014  . Memory change 05/24/2014  . Benign paroxysmal positional vertigo 07/13/2013  . Orthostasis 07/13/2013  . Migraine, unspecified, without mention of intractable migraine without mention of status migrainosus 04/03/2013  . Medicare annual wellness visit, initial 02/17/2013  . Pain in joint, ankle and foot 10/19/2012  . Irritable mood 01/18/2012  . Cough 12/21/2011  . Tobacco abuse 12/21/2011  . Skin lesion of face 04/27/2011  . Neck pain 12/03/2010  . Hearing loss 12/03/2010  . 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 HYPERCHOLESTEROLEMIA 10/20/2010  . DEPRESSION 10/20/2010  . OSTEOARTHRITIS 10/20/2010  . ARTHRITIS 10/20/2010  . NEPHROLITHIASIS, HX OF 10/20/2010  . CHICKENPOX, HX OF 10/20/2010  . ESSENTIAL HYPERTENSION, BENIGN 09/30/2010  . CORONARY ATHEROSCLEROSIS NATIVE  CORONARY ARTERY 09/30/2010  . SLEEP APNEA 09/30/2010  . CHEST PAIN UNSPECIFIED 09/30/2010  . PERCUTANEOUS TRANSLUMINAL CORONARY ANGIOPLASTY, HX OF 09/30/2010    1. OSA doing well currently is on a pressure of 8CWP. This will be continued 2. CADstable follows with cardiology 3. Obesity needs to exercise and work on weight loss  General Counseling: I have discussed the findings of the evaluation and examination with Tony Manning.  I have also discussed any further diagnostic evaluation thatmay be needed or ordered today. Tony Manning verbalizes understanding of the findings of todays visit. We also reviewed his medications today and discussed drug interactions and side effects including but not limited excessive drowsiness and altered mental states. We also discussed that there is always a risk not just to him but also people around him. he has been encouraged to call the office with any questions or concerns that should arise related to todays visit.    Time spent: 11min  I have personally obtained a history, examined the patient, evaluated laboratory and imaging results, formulated the assessment and plan and placed orders.    Allyne Gee, MD [facility] Pulmonary and Critical Care Sleep medicine

## 2017-10-11 NOTE — Patient Instructions (Signed)

## 2017-10-12 DIAGNOSIS — G4733 Obstructive sleep apnea (adult) (pediatric): Secondary | ICD-10-CM | POA: Diagnosis not present

## 2017-10-14 ENCOUNTER — Other Ambulatory Visit: Payer: Self-pay | Admitting: Family Medicine

## 2017-10-25 ENCOUNTER — Ambulatory Visit: Payer: Self-pay | Admitting: Nurse Practitioner

## 2017-11-03 ENCOUNTER — Ambulatory Visit (INDEPENDENT_AMBULATORY_CARE_PROVIDER_SITE_OTHER): Payer: Medicare HMO | Admitting: Orthotics

## 2017-11-03 DIAGNOSIS — M722 Plantar fascial fibromatosis: Secondary | ICD-10-CM | POA: Diagnosis not present

## 2017-11-03 DIAGNOSIS — E1149 Type 2 diabetes mellitus with other diabetic neurological complication: Secondary | ICD-10-CM | POA: Diagnosis not present

## 2017-11-03 DIAGNOSIS — M2041 Other hammer toe(s) (acquired), right foot: Secondary | ICD-10-CM

## 2017-11-03 DIAGNOSIS — M2010 Hallux valgus (acquired), unspecified foot: Secondary | ICD-10-CM

## 2017-11-03 DIAGNOSIS — M2042 Other hammer toe(s) (acquired), left foot: Secondary | ICD-10-CM | POA: Diagnosis not present

## 2017-11-03 NOTE — Progress Notes (Signed)

## 2017-11-09 DIAGNOSIS — G4733 Obstructive sleep apnea (adult) (pediatric): Secondary | ICD-10-CM | POA: Diagnosis not present

## 2017-11-11 ENCOUNTER — Encounter: Payer: Self-pay | Admitting: Podiatry

## 2017-11-11 ENCOUNTER — Ambulatory Visit: Payer: Medicare HMO | Admitting: Podiatry

## 2017-11-11 ENCOUNTER — Ambulatory Visit (INDEPENDENT_AMBULATORY_CARE_PROVIDER_SITE_OTHER): Payer: Medicare HMO

## 2017-11-11 DIAGNOSIS — S9032XA Contusion of left foot, initial encounter: Secondary | ICD-10-CM

## 2017-11-11 DIAGNOSIS — M779 Enthesopathy, unspecified: Secondary | ICD-10-CM

## 2017-11-11 DIAGNOSIS — R609 Edema, unspecified: Secondary | ICD-10-CM

## 2017-11-15 NOTE — Progress Notes (Signed)
Subjective: Bryon presents the office today for concerns of pain to the top of his left foot which is been ongoing now for about 6 weeks.  He states that initially the dog stepped on top of his foot.  He had pain after that is been getting somewhat better but he does continue.  He denies any swelling or redness.  He is on his feet quite a bit he is able to wear regular shoe and do daily activities with minimal pain however it is still hurting to the top of his foot.  Denies any open sores. Denies any systemic complaints such as fevers, chills, nausea, vomiting. No acute changes since last appointment, and no other complaints at this time.   Objective: AAO x3, NAD DP/PT pulses palpable bilaterally, CRT less than 3 seconds There is tenderness or pain on the dorsal aspect the foot on the Lisfranc joint on the second third metatarsal cuneiform joint area.  There is no specific area pinpoint tenderness.  There is no overlying edema, erythema, increase in warmth.  There is no open lesions identified.  No pain with ankle or other areas of the foot. No open lesions or pre-ulcerative lesions.  No pain with calf compression, swelling, warmth, erythema  Assessment: Capsulitis left foot, contusion foot  Plan: -All treatment options discussed with the patient including all alternatives, risks, complications.  -X-rays were obtained and reviewed.  There is no evidence of acute fracture or stress fracture identified today. -Given the continuation of symptoms I discussed a steroid injection.  He wishes to proceed with this.  After the area was prepped with alcohol 2 cc of a mixture of 1 cc Kenalog 10, 0.5% Marcaine plain and 2% lidocaine plain was infiltrated into the area of tenderness on the dorsal aspect of the foot without complications.  Postinjection care was discussed.  He tolerated well without any complications. -Ice to the area.  Continue supportive shoes. -If not resolved next couple weeks to call  the office for follow-up otherwise I will see him back as needed and he agrees with this. -Patient encouraged to call the office with any questions, concerns, change in symptoms.    Trula Slade DPM

## 2017-12-06 ENCOUNTER — Ambulatory Visit: Payer: Self-pay | Admitting: *Deleted

## 2017-12-06 DIAGNOSIS — G4733 Obstructive sleep apnea (adult) (pediatric): Secondary | ICD-10-CM | POA: Diagnosis not present

## 2017-12-06 DIAGNOSIS — E118 Type 2 diabetes mellitus with unspecified complications: Secondary | ICD-10-CM | POA: Diagnosis not present

## 2017-12-06 DIAGNOSIS — I25119 Atherosclerotic heart disease of native coronary artery with unspecified angina pectoris: Secondary | ICD-10-CM | POA: Diagnosis not present

## 2017-12-06 DIAGNOSIS — Z9989 Dependence on other enabling machines and devices: Secondary | ICD-10-CM | POA: Diagnosis not present

## 2017-12-06 NOTE — Telephone Encounter (Signed)
[name] phoned in to report his fasting blood sugars have been high over the last 2 months from 250-300 in the mornings. He stated his diet has not been so good during this time due to the fact he has been coping with the illness and recent loss(last week) of his father. He does use insulin and oral agents and has been taking as prescribed. He saw the Cardiologist this morning and was given a sample of Jardiance and wants to discuss the use of this medication.Appointment made with PCP for tomorrow.  Advised he increase his water intake in the meantime.  Reason for Disposition . [1] Symptoms of high blood sugar (e.g., frequent urination, weak, weight loss) AND [2] not able to test blood glucose  Answer Assessment - Initial Assessment Questions 1. BLOOD GLUCOSE: "What is your blood glucose level?"      248 2. ONSET: "When did you check the blood glucose?"    This morning 3. USUAL RANGE: "What is your glucose level usually?" (e.g., usual fasting morning value, usual evening value)    Last 2 months it has stayed 250 average. 4. KETONES: "Do you check for ketones (urine or blood test strips)?" If yes, ask: "What does the test show now?"     no 5. TYPE 1 or 2:  "Do you know what type of diabetes you have?"  (e.g., Type 1, Type 2, Gestational; doesn't know)    Type 2 6. INSULIN: "Do you take insulin?" If yes, ask: "Have you missed any shots recently?"     Yes, and has not missed any. 7. DIABETES PILLS: "Do you take any pills for your diabetes?" If yes, ask: "Have you missed taking any pills recently?"  metformin and glymeperzide 8. OTHER SYMPTOMS: "Do you have any symptoms?" (e.g., fever, frequent urination, difficulty breathing, dizziness, weakness, vomiting)     Occasional frequent urination at times 9. PREGNANCY: "Is there any chance you are pregnant?" "When was your last menstrual period?"   na  Protocols used: DIABETES - HIGH BLOOD SUGAR-A-AH

## 2017-12-07 ENCOUNTER — Encounter: Payer: Self-pay | Admitting: Family Medicine

## 2017-12-07 ENCOUNTER — Ambulatory Visit (INDEPENDENT_AMBULATORY_CARE_PROVIDER_SITE_OTHER): Payer: Medicare HMO | Admitting: Family Medicine

## 2017-12-07 VITALS — BP 128/62 | HR 69 | Temp 98.5°F | Wt 217.5 lb

## 2017-12-07 DIAGNOSIS — E1149 Type 2 diabetes mellitus with other diabetic neurological complication: Secondary | ICD-10-CM

## 2017-12-07 DIAGNOSIS — Z794 Long term (current) use of insulin: Secondary | ICD-10-CM | POA: Diagnosis not present

## 2017-12-07 LAB — BASIC METABOLIC PANEL
BUN: 18 mg/dL (ref 6–23)
CO2: 28 mEq/L (ref 19–32)
Calcium: 9.4 mg/dL (ref 8.4–10.5)
Chloride: 103 mEq/L (ref 96–112)
Creatinine, Ser: 1.05 mg/dL (ref 0.40–1.50)
GFR: 77.17 mL/min (ref >60.00)
Glucose, Bld: 278 mg/dL — ABNORMAL HIGH (ref 70–99)
Potassium: 4.6 mEq/L (ref 3.5–5.1)
Sodium: 137 mEq/L (ref 135–145)

## 2017-12-07 LAB — LIPID PANEL
Cholesterol: 129 mg/dL (ref 0–200)
HDL: 38.6 mg/dL — ABNORMAL LOW (ref >39.00)
LDL Cholesterol: 56 mg/dL (ref 0–99)
NonHDL: 90.62
Total CHOL/HDL Ratio: 3
Triglycerides: 172 mg/dL — ABNORMAL HIGH (ref 0.0–149.0)
VLDL: 34.4 mg/dL (ref 0.0–40.0)

## 2017-12-07 LAB — HEMOGLOBIN A1C: Hgb A1c MFr Bld: 8.8 % — ABNORMAL HIGH (ref 4.6–6.5)

## 2017-12-07 MED ORDER — INSULIN DETEMIR 100 UNIT/ML FLEXPEN: PEN_INJECTOR | SUBCUTANEOUS | 0 refills | Status: DC

## 2017-12-07 NOTE — Patient Instructions (Addendum)
Go to the lab on the way out.  We'll contact you with your lab report. Don't change your meds for now.  Take care.  Glad to see you.  

## 2017-12-07 NOTE — Progress Notes (Signed)
His father recently died, I offered condolences. D/w pt.  This has been difficult for patient and that likely affected his sugar.   He thought is short term memory was worse recently.  I question how much of that is related to recent stressors.  He isn't getting lost but he'll get sidetracked and have to restart on a task.  Normal brief testing today and we agreed to monitor.    He feels a shocking sensation in his neck and back and arms and chest.  Unclear how much of this is related to diabetic neuropathy, hyperglycemia.  Discussed with patient.  He has had diffusely elevated sugars recently.  B12 recently wnl.    Diabetes:  Using medications without difficulties: yes Hypoglycemic episodes:no Hyperglycemic episodes: 200-300s Feet problems:no Blood Sugars averaging: ~250 recently, see above.   eye exam within last year:yes Due for labs.   Insulin up to 60 units now.    Cardiology gave him samples of jardiance but patient cannot afford that in the long term and patient hasn't started yet.    Meds, vitals, and allergies reviewed.   ROS: Per HPI unless specifically indicated in ROS section   GEN: nad, alert and oriented, 3/3 attention and recall.   HEENT: mucous membranes moist NECK: supple w/o LA CV: rrr. PULM: ctab, no inc wob ABD: soft, +bs EXT: no edema  Diabetic foot exam: Normal inspection No skin breakdown No calluses  Normal DP pulses Norma. sensation to light touch; dec to monofilament B Nails normal

## 2017-12-09 NOTE — Assessment & Plan Note (Signed)
Check labs today, see notes on labs.  He is not yet started [place].  It is unclear if he will be able to get extra samples from cardiology.  I do not of sample to give the patient.  I do think some of his symptoms are likely related to neuropathy and not cardiac.  He has recently seen cardiology and they did not think he needed any additional cardiovascular testing.  I did not yet change his medicines at the office visit.  He agrees with plan.  He agreed that we should monitor his memory in light of recent stressors.

## 2017-12-10 DIAGNOSIS — G4733 Obstructive sleep apnea (adult) (pediatric): Secondary | ICD-10-CM | POA: Diagnosis not present

## 2017-12-14 ENCOUNTER — Encounter: Payer: Self-pay | Admitting: *Deleted

## 2017-12-16 ENCOUNTER — Encounter: Payer: Self-pay | Admitting: Family Medicine

## 2017-12-16 ENCOUNTER — Ambulatory Visit (INDEPENDENT_AMBULATORY_CARE_PROVIDER_SITE_OTHER): Payer: Medicare HMO | Admitting: Family Medicine

## 2017-12-16 DIAGNOSIS — L089 Local infection of the skin and subcutaneous tissue, unspecified: Secondary | ICD-10-CM | POA: Diagnosis not present

## 2017-12-16 DIAGNOSIS — E1149 Type 2 diabetes mellitus with other diabetic neurological complication: Secondary | ICD-10-CM

## 2017-12-16 DIAGNOSIS — Z794 Long term (current) use of insulin: Secondary | ICD-10-CM | POA: Diagnosis not present

## 2017-12-16 MED ORDER — DOXYCYCLINE HYCLATE 100 MG PO TABS: 100.0000 mg | ORAL_TABLET | Freq: Two times a day (BID) | ORAL | 0 refills | Status: DC

## 2017-12-16 MED ORDER — EMPAGLIFLOZIN 25 MG PO TABS: 25.0000 mg | ORAL_TABLET | Freq: Every day | ORAL | Status: DC

## 2017-12-16 NOTE — Progress Notes (Signed)
Bitten by tick on L forearm, removed quickly, but had gotten red and drained in the meantime.  Still tender.  Kept it covered with neosporin.  Not using warm compresses.  No fevers.    On jardiance now and sugar dec'd to 160-180s, down from >200s.  Routine cautions with jardiance d/w pt.    Meds, vitals, and allergies reviewed.   ROS: Per HPI unless specifically indicated in ROS section   Nad ncat L forearm with 2cm irritated area and draining superficial abscess remnant.  He does not have a sealed over fluctuant lesion.  He has some scant drainage from small openings in the skin in the center of the lesion. Mildly ttp.   No spreading erythema otherwise.  Distally with normal capillary refill.

## 2017-12-16 NOTE — Patient Instructions (Addendum)
Let me know if you need a refill on the Jardiance.  Use warm compresses and change the dressing daily.  Start doxycycline in the meantime and update me as needed.  Take care.  Glad to see you.

## 2017-12-18 DIAGNOSIS — L089 Local infection of the skin and subcutaneous tissue, unspecified: Secondary | ICD-10-CM | POA: Insufficient documentation

## 2017-12-18 NOTE — Assessment & Plan Note (Signed)
Given his situation with diabetes I would treat him with doxycycline.  Lesion covered with Neosporin and a nonstick bandage.  It does not need incision and drainage since it is already actively draining some.  He can use warm compresses to encourage further drainage.  Update me as needed.  He agrees.  Okay for outpatient follow-up.

## 2017-12-18 NOTE — Assessment & Plan Note (Signed)
He has been on Jardiance with improved sugar recently and he will update me about how he does with that medication.  He will let me know about his sugar readings.  We can refill the medication if needed. He agrees.

## 2017-12-29 DIAGNOSIS — R0602 Shortness of breath: Secondary | ICD-10-CM | POA: Diagnosis not present

## 2017-12-29 DIAGNOSIS — I25119 Atherosclerotic heart disease of native coronary artery with unspecified angina pectoris: Secondary | ICD-10-CM | POA: Diagnosis not present

## 2017-12-29 DIAGNOSIS — R0789 Other chest pain: Secondary | ICD-10-CM | POA: Diagnosis not present

## 2017-12-30 ENCOUNTER — Ambulatory Visit
Admission: RE | Admit: 2017-12-30 | Discharge: 2017-12-30 | Disposition: A | Discharge status: Home / Self Care | Payer: Medicare HMO | Source: Ambulatory Visit | Attending: Cardiology | Admitting: Cardiology

## 2017-12-30 ENCOUNTER — Other Ambulatory Visit (HOSPITAL_COMMUNITY): Payer: Self-pay | Admitting: Cardiology

## 2017-12-30 DIAGNOSIS — I2510 Atherosclerotic heart disease of native coronary artery without angina pectoris: Secondary | ICD-10-CM

## 2017-12-30 DIAGNOSIS — R0602 Shortness of breath: Secondary | ICD-10-CM | POA: Diagnosis not present

## 2017-12-31 ENCOUNTER — Ambulatory Visit: Payer: Self-pay | Admitting: *Deleted

## 2017-12-31 NOTE — Telephone Encounter (Signed)
Patient advised.

## 2017-12-31 NOTE — Telephone Encounter (Signed)
Pt called in c/o having chest tightness, soreness and burning.   This has been going on for years.   I saw a cardiologist day before yesterday and my EKG and troponins  Were negative.  I had a chest x-ray yesterday but I don't know the results.   I'm for a stress test next Friday.   I mainly called to make an appt with Dr. [name] per my cardiologist's instructions.  See triage notes.   He denies having any of the cardiac symptoms except some shortness of breath.    He does have a history of heart attack with 5 stents inserted.   I advised him per the protocol he needs to go to the ED but he stated,   "I don't need to go to the ED again".    "I just had a cardiac work up day before yesterday and everything was normal".   "I'm not going to go over there and be there for hours again when I don't need to".    I instructed him to go to the ED/call 911 if he experienced worsening shortness of breath, increased discomfort in his chest, nausea, radiation of discomfort/pain to either arm, neck, jaw, or back, breaks out into a sweat to call 911.  He verbalized understanding of these instructions and agreed with this plan.    The agent made him an appt with Dr. [name] for Monday 01/03/18 at 3:30 per his cardiologist's instructions when he called the cardiologist's office to let them know he was having the chest pain still.  I routed a note to Dr. [name] making him aware of this call.   Reason for Disposition . [1] Chest pain lasts > 5 minutes AND [2] history of heart disease  (i.e., heart attack, bypass surgery, angina, angioplasty, CHF; not just a heart murmur)  Answer Assessment - Initial Assessment Questions 1. LOCATION: "Where does it hurt?"       Been to cardiologist day before yesterday.    Did EKG and troponins.  I'm for a stress test next Friday.      It hurts, tight and sore and burns.   I had a x-ray yesterday.   I don't want to go to the ED and spend all day.  I've  had this problem for years.  My  heart doctor told me to call my primary doctor.  Dr. [name] is aware of this. 2. 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 3. ONSET: "When did the chest pain begin?" (Minutes, hours or days)      This pain continues.  It never stops.   That's the reason I saw the cardiologist. 4. PATTERN "Does the pain come and go, or has it been constant since it started?"  "Does it get worse with exertion?"      I've had this pain for years.    5. DURATION: "How long does it last" (e.g., seconds, minutes, hours)     I've had this for years. 6. SEVERITY: "How bad is the pain?"  (e.g., Scale 1-10; mild, moderate, or severe)    - MILD (1-3): doesn't interfere with normal activities     - MODERATE (4-7): interferes with normal activities or awakens from sleep    - SEVERE (8-10): excruciating pain, unable to do any normal activities       Tightness when I'm breathing and shortness of breath.   This is  nothing new.   All my tests with the heart doctor are normal.   7. CARDIAC RISK FACTORS: "Do you have any history of heart problems or risk factors for heart disease?" (e.g., prior heart attack, angina; high blood pressure, diabetes, being overweight, high cholesterol, smoking, or strong family history of heart disease)     I had a heart attack before with 5 stents put in.   The heart doctor said all of this is not related to my heart. 8. PULMONARY RISK FACTORS: "Do you have any history of lung disease?"  (e.g., blood clots in lung, asthma, emphysema, birth control pills)     No 9. CAUSE: "What do you think is causing the chest pain?"     I think it's something about the lining of my chest. 10. OTHER SYMPTOMS: "Do you have any other symptoms?" (e.g., dizziness, nausea, vomiting, sweating, fever, difficulty breathing, cough)       None of the above 11. PREGNANCY: "Is there any chance you are pregnant?" "When was your last menstrual period?"        N/A  Protocols used: CHEST PAIN-A-AH

## 2017-12-31 NOTE — Telephone Encounter (Signed)
I'm glad to see him as scheduled but if he declined ER eval then at least have him update cardiology to make sure they don't want to do anything else sooner. Thanks.

## 2018-01-01 ENCOUNTER — Other Ambulatory Visit: Payer: Self-pay | Admitting: Family Medicine

## 2018-01-03 ENCOUNTER — Encounter: Payer: Self-pay | Admitting: Family Medicine

## 2018-01-03 ENCOUNTER — Ambulatory Visit (INDEPENDENT_AMBULATORY_CARE_PROVIDER_SITE_OTHER): Payer: Medicare HMO | Admitting: Family Medicine

## 2018-01-03 DIAGNOSIS — R079 Chest pain, unspecified: Secondary | ICD-10-CM

## 2018-01-03 DIAGNOSIS — E1149 Type 2 diabetes mellitus with other diabetic neurological complication: Secondary | ICD-10-CM | POA: Diagnosis not present

## 2018-01-03 DIAGNOSIS — Z794 Long term (current) use of insulin: Secondary | ICD-10-CM

## 2018-01-03 MED ORDER — INSULIN PEN NEEDLE 31G X 4 MM MISC: 1.0000 | Freq: Every day | 3 refills | Status: DC

## 2018-01-03 MED ORDER — EMPAGLIFLOZIN 25 MG PO TABS: 25.0000 mg | ORAL_TABLET | Freq: Every day | ORAL | 3 refills | Status: DC

## 2018-01-03 NOTE — Progress Notes (Signed)
Burning and stinging in the chest wall/anterior chest.  Can be tender to the touch.  And can be tender in the upper abd wall.  Intermittent sx may or may not be worse with exertion.  He is fatigued with less ability to tolerate exertion.  He'll have diffuse but not focal fatigue/weakness in the legs with exertion.  He has seen cards with f/u stress testing and echo pending.   Recent CXR: Scarring and postoperative change right lower lobe. No edema or consolidation. Heart size within normal limits.  NTG helped some but caused a HA last week.    DM2.  Sugar has been 150-240 on current meds with jardiance.  He tapered his insulin slightly, usually taking 40-50 units per day.    Meds, vitals, and allergies reviewed.   ROS: Per HPI unless specifically indicated in ROS section   Nad ncat OP wnl Neck supple, no LA rrr ctab abd soft, normal BS, not ttp Chest wall is ttp B, pain with deep breath, but better with external compression during a deep breath.   Ext w/o edema.

## 2018-01-03 NOTE — Patient Instructions (Signed)
Continue jardiance.  Try using diclofenac gel on your chest wall and I'll await the stress test results . Take care.  Glad to see you.

## 2018-01-04 ENCOUNTER — Other Ambulatory Visit: Payer: Self-pay | Admitting: *Deleted

## 2018-01-04 DIAGNOSIS — G4733 Obstructive sleep apnea (adult) (pediatric): Secondary | ICD-10-CM | POA: Diagnosis not present

## 2018-01-04 MED ORDER — INSULIN PEN NEEDLE 32G X 4 MM MISC
3 refills | Status: DC
Start: 2018-01-04 — End: 2022-05-06

## 2018-01-04 NOTE — Assessment & Plan Note (Signed)
He has follow-up with cardiology pending.  Reasonable to go through with stress testing as planned.  He does have chest wall pain that is noted on palpation.  He has pain with deep breath that is better with external compression.  He could be that he just has a musculoskeletal component to his chest wall pain.  He has a prescription for diclofenac gel.  He can try that on the chest wall to see if that helps.  He has bilateral symptoms so that he can just use it on one side so he will have results for comparison.  He does not have typical heartburn symptoms.  Chest x-ray recently unremarkable.  Still okay for outpatient follow-up.

## 2018-01-04 NOTE — Assessment & Plan Note (Addendum)
He sugars are some better and he was able to taper his insulin some with Jardiance.  Continue as is.  Prescription sent.  Continue work on diet.  More reasonable for him to get through the stress test first then to change his medications otherwise right now.  He agrees.

## 2018-01-06 ENCOUNTER — Other Ambulatory Visit: Payer: Self-pay | Admitting: Family Medicine

## 2018-01-07 DIAGNOSIS — R0789 Other chest pain: Secondary | ICD-10-CM | POA: Diagnosis not present

## 2018-01-08 ENCOUNTER — Other Ambulatory Visit: Payer: Self-pay | Admitting: Family Medicine

## 2018-01-09 DIAGNOSIS — G4733 Obstructive sleep apnea (adult) (pediatric): Secondary | ICD-10-CM | POA: Diagnosis not present

## 2018-01-20 ENCOUNTER — Ambulatory Visit: Payer: Self-pay | Admitting: Internal Medicine

## 2018-01-27 ENCOUNTER — Other Ambulatory Visit: Payer: Self-pay | Admitting: Family Medicine

## 2018-01-27 ENCOUNTER — Ambulatory Visit: Payer: Medicare HMO

## 2018-01-27 DIAGNOSIS — E559 Vitamin D deficiency, unspecified: Secondary | ICD-10-CM

## 2018-01-27 DIAGNOSIS — E1149 Type 2 diabetes mellitus with other diabetic neurological complication: Secondary | ICD-10-CM

## 2018-01-27 DIAGNOSIS — Z794 Long term (current) use of insulin: Secondary | ICD-10-CM

## 2018-01-28 ENCOUNTER — Encounter: Payer: Self-pay | Admitting: Internal Medicine

## 2018-02-01 DIAGNOSIS — I25119 Atherosclerotic heart disease of native coronary artery with unspecified angina pectoris: Secondary | ICD-10-CM | POA: Diagnosis not present

## 2018-02-01 DIAGNOSIS — R0789 Other chest pain: Secondary | ICD-10-CM | POA: Diagnosis not present

## 2018-02-04 ENCOUNTER — Ambulatory Visit: Payer: Medicare HMO

## 2018-02-07 ENCOUNTER — Encounter: Payer: Medicare HMO | Admitting: Family Medicine

## 2018-02-09 DIAGNOSIS — G4733 Obstructive sleep apnea (adult) (pediatric): Secondary | ICD-10-CM | POA: Diagnosis not present

## 2018-02-10 DIAGNOSIS — R0789 Other chest pain: Secondary | ICD-10-CM | POA: Diagnosis not present

## 2018-02-10 DIAGNOSIS — E118 Type 2 diabetes mellitus with unspecified complications: Secondary | ICD-10-CM | POA: Diagnosis not present

## 2018-02-10 DIAGNOSIS — I2510 Atherosclerotic heart disease of native coronary artery without angina pectoris: Secondary | ICD-10-CM | POA: Diagnosis not present

## 2018-02-10 DIAGNOSIS — E1165 Type 2 diabetes mellitus with hyperglycemia: Secondary | ICD-10-CM | POA: Diagnosis not present

## 2018-03-11 DIAGNOSIS — G4733 Obstructive sleep apnea (adult) (pediatric): Secondary | ICD-10-CM | POA: Diagnosis not present

## 2018-03-16 ENCOUNTER — Ambulatory Visit: Payer: Medicare HMO | Admitting: Family Medicine

## 2018-03-21 ENCOUNTER — Ambulatory Visit (INDEPENDENT_AMBULATORY_CARE_PROVIDER_SITE_OTHER)
Admission: RE | Admit: 2018-03-21 | Discharge: 2018-03-21 | Disposition: A | Discharge status: Home / Self Care | Payer: Medicare HMO | Source: Ambulatory Visit | Attending: Family Medicine | Admitting: Family Medicine

## 2018-03-21 ENCOUNTER — Ambulatory Visit (INDEPENDENT_AMBULATORY_CARE_PROVIDER_SITE_OTHER): Payer: Medicare HMO | Admitting: Family Medicine

## 2018-03-21 ENCOUNTER — Encounter: Payer: Self-pay | Admitting: Family Medicine

## 2018-03-21 VITALS — BP 102/64 | HR 67 | Temp 98.4°F | Ht 70.0 in | Wt 207.2 lb

## 2018-03-21 DIAGNOSIS — E1149 Type 2 diabetes mellitus with other diabetic neurological complication: Secondary | ICD-10-CM | POA: Diagnosis not present

## 2018-03-21 DIAGNOSIS — E559 Vitamin D deficiency, unspecified: Secondary | ICD-10-CM | POA: Diagnosis not present

## 2018-03-21 DIAGNOSIS — M549 Dorsalgia, unspecified: Secondary | ICD-10-CM

## 2018-03-21 DIAGNOSIS — Z794 Long term (current) use of insulin: Secondary | ICD-10-CM

## 2018-03-21 DIAGNOSIS — R319 Hematuria, unspecified: Secondary | ICD-10-CM | POA: Diagnosis not present

## 2018-03-21 DIAGNOSIS — R109 Unspecified abdominal pain: Secondary | ICD-10-CM | POA: Diagnosis not present

## 2018-03-21 DIAGNOSIS — N200 Calculus of kidney: Secondary | ICD-10-CM

## 2018-03-21 LAB — POC URINALSYSI DIPSTICK (AUTOMATED)
Bilirubin, UA: NEGATIVE
Glucose, UA: POSITIVE — AB
Ketones, UA: NEGATIVE
Leukocytes, UA: NEGATIVE
Nitrite, UA: NEGATIVE
Protein, UA: NEGATIVE
Spec Grav, UA: >=1.03 — AB (ref 1.010–1.025)
Urobilinogen, UA: 0.2 E.U./dL
pH, UA: 6 (ref 5.0–8.0)

## 2018-03-21 LAB — VITAMIN D 25 HYDROXY (VIT D DEFICIENCY, FRACTURES): VITD: 19.37 ng/mL — ABNORMAL LOW (ref 30.00–100.00)

## 2018-03-21 LAB — TSH: TSH: 2.22 u[IU]/mL (ref 0.35–4.50)

## 2018-03-21 LAB — HEMOGLOBIN A1C: Hgb A1c MFr Bld: 7.8 % — ABNORMAL HIGH (ref 4.6–6.5)

## 2018-03-21 MED ORDER — OXYCODONE HCL 5 MG PO TABS: 5.0000 mg | ORAL_TABLET | Freq: Four times a day (QID) | ORAL | 0 refills | Status: DC | PRN

## 2018-03-21 MED ORDER — INSULIN DETEMIR 100 UNIT/ML FLEXPEN: PEN_INJECTOR | SUBCUTANEOUS | 0 refills | Status: DC

## 2018-03-21 NOTE — Progress Notes (Signed)
He thought he could have a renal stone.  Intermittent blood in urine.  Sx started about 3 days ago.  Nausea and vomiting.  Back pain, episodic.  Flares of pain.  No burning with urination.  R flank pain.  No L sided sx. tmax 99.9 this weekend. He has had mult renal stones in the past.  Already on flomax.   D/w pt about urology eval re: h/o many stones, even if this episode wasn't stone related.   Due for f/u labs.  Pending, d/w pt.   Meds, vitals, and allergies reviewed.   ROS: Per HPI unless specifically indicated in ROS section   GEN: nad, alert and oriented HEENT: mucous membranes moist NECK: supple w/o LA CV: rrr.  PULM: ctab, no inc wob ABD: soft, +bs, not ttp EXT: no edema SKIN: no acute rash  No pain at time of exam.

## 2018-03-21 NOTE — Patient Instructions (Addendum)
We will call about your referral.  Rosaria Ferries or Azalee Course will call you if you don't see one of them on the way out.  Go to the lab on the way out.  We'll contact you with your lab report. Take care.  Glad to see you.  Keep taking flomax and use the oxycodone if needed.  Drink a lot of water.   Update me as needed.

## 2018-03-22 ENCOUNTER — Other Ambulatory Visit: Payer: Self-pay | Admitting: Family Medicine

## 2018-03-22 DIAGNOSIS — N200 Calculus of kidney: Secondary | ICD-10-CM | POA: Insufficient documentation

## 2018-03-22 DIAGNOSIS — E559 Vitamin D deficiency, unspecified: Secondary | ICD-10-CM

## 2018-03-22 DIAGNOSIS — E1149 Type 2 diabetes mellitus with other diabetic neurological complication: Secondary | ICD-10-CM

## 2018-03-22 DIAGNOSIS — R319 Hematuria, unspecified: Secondary | ICD-10-CM | POA: Insufficient documentation

## 2018-03-22 DIAGNOSIS — Z794 Long term (current) use of insulin: Secondary | ICD-10-CM

## 2018-03-22 LAB — URINE CULTURE
MICRO NUMBER:: 90834992
Result:: NO GROWTH
SPECIMEN QUALITY:: ADEQUATE

## 2018-03-22 MED ORDER — VITAMIN D (ERGOCALCIFEROL) 1.25 MG (50000 UNIT) PO CAPS: 50000.0000 [IU] | ORAL_CAPSULE | ORAL | 0 refills | Status: DC

## 2018-03-22 NOTE — Assessment & Plan Note (Signed)
See notes on labs. 

## 2018-03-22 NOTE — Assessment & Plan Note (Signed)
Presumed stone.  Still okay for outpatient follow-up.  No pain at time of exam.  See notes on KUB.  Continue Flomax.  Refer to urology given his history of many, many stones over the years.  Use oxycodone as needed for pain with routine cautions. He agrees.

## 2018-03-24 ENCOUNTER — Telehealth: Payer: Self-pay

## 2018-03-24 NOTE — Telephone Encounter (Signed)
Left message for patient to call Akelia Husted back in regards to a referral-Jordyn Hofacker V Yuval Rubens, RMA   

## 2018-03-25 ENCOUNTER — Ambulatory Visit: Payer: Medicare HMO | Admitting: Family Medicine

## 2018-04-02 ENCOUNTER — Other Ambulatory Visit: Payer: Self-pay | Admitting: Family Medicine

## 2018-04-08 ENCOUNTER — Ambulatory Visit: Payer: Self-pay

## 2018-04-08 NOTE — Telephone Encounter (Signed)
Incoming call from patient  Stating that he has had diarrhea since 10 pm last night.  Patient states that he uses the bathroom approximately every 30 minutes with a loose stool.  Denies vomiting  Abdominal pain, vomiting, states he is a little weak " nothing out of the usual"  Has not been exposed to any one with diarrhea.  Denies eating food that was spoiled.  Denies fever.  Has not been on any antibiotic recently. Patient states that he has had 2 doses of Imodium since 10 pm last night.  Patient inquires if something may be called into [facility]. Will monitor BMs over the weekend and call back if symptoms worsen.   Reason for Disposition . [1] MILD diarrhea (e.g., 1-3 or more stools than normal in past 24 hours) without known cause AND [2] present >  7 days  Answer Assessment - Initial Assessment Questions 1. DIARRHEA SEVERITY: "How bad is the diarrhea?" "How many extra stools have you had in the past 24 hours than normal?"    - NO DIARRHEA (SCALE 0)   - MILD (SCALE 1-3): Few loose or mushy BMs; increase of 1-3 stools over normal daily number of stools; mild increase in ostomy output.   -  MODERATE (SCALE 4-7): Increase of 4-6 stools daily over normal; moderate increase in ostomy output. * SEVERE (SCALE 8-10; OR 'WORST POSSIBLE'): Increase of 7 or more stools daily over normal; moderate increase in ostomy output; incontinence.     sice 10 pm every 82minutes 2. ONSET: "When did the diarrhea begin?"       10 pm last night 3. BM CONSISTENCY: "How loose or watery is the diarrhea?"      Loose stools 4. VOMITING: "Are you also vomiting?" If so, ask: "How many times in the past 24 hours?"      no 5. ABDOMINAL PAIN: "Are you having any abdominal pain?" If yes: "What does it feel like?" (e.g., crampy, dull, intermittent, constant)      no 6. ABDOMINAL PAIN SEVERITY: If present, ask: "How bad is the pain?"  (e.g., Scale 1-10; mild, moderate, or severe)   - MILD (1-3): doesn't interfere with normal  activities, abdomen soft and not tender to touch    - MODERATE (4-7): interferes with normal activities or awakens from sleep, tender to touch    - SEVERE (8-10): excruciating pain, doubled over, unable to do any normal activities       no 7. ORAL INTAKE: If vomiting, "Have you been able to drink liquids?" "How much fluids have you had in the past 24 hours?"     na 8. HYDRATION: "Any signs of dehydration?" (e.g., dry mouth [not just dry lips], too weak to stand, dizziness, new weight loss) "When did you last urinate?"    A little weak 9. EXPOSURE: "Have you traveled to a foreign country recently?" "Have you been exposed to anyone with diarrhea?" "Could you have eaten any food that was spoiled?"     no 10. ANTIBIOTIC USE: "Are you taking antibiotics now or have you taken antibiotics in the past 2 months?"       no 11. OTHER SYMPTOMS: "Do you have any other symptoms?" (e.g., fever, blood in stool)       no 12. PREGNANCY: "Is there any chance you are pregnant?" "When was your last menstrual period?"       na  Protocols used: [facility]

## 2018-04-08 NOTE — Telephone Encounter (Signed)
I would not try anything stronger than imodium for mult reasons.  If imodium isn't working, then other meds likely wouldn't work or shouldn't be used in the acute setting, meaning he needs to let this run its course.  This assumes he is able to stay hydrated.  If not able to keep up with fluid intake, then needs to get checked over the weekend as this sounds like it could be viral.   Update [place] as needed.  Thanks.

## 2018-04-08 NOTE — Telephone Encounter (Signed)
Left detailed message on voicemail.  

## 2018-04-11 DIAGNOSIS — G4733 Obstructive sleep apnea (adult) (pediatric): Secondary | ICD-10-CM | POA: Diagnosis not present

## 2018-04-14 ENCOUNTER — Ambulatory Visit: Payer: Self-pay | Admitting: Internal Medicine

## 2018-04-19 DIAGNOSIS — R1111 Vomiting without nausea: Secondary | ICD-10-CM | POA: Diagnosis not present

## 2018-04-19 DIAGNOSIS — N200 Calculus of kidney: Secondary | ICD-10-CM | POA: Diagnosis not present

## 2018-04-21 ENCOUNTER — Encounter: Payer: Self-pay | Admitting: Internal Medicine

## 2018-04-21 ENCOUNTER — Ambulatory Visit: Payer: Medicare HMO | Admitting: Internal Medicine

## 2018-04-21 VITALS — BP 137/69 | HR 60 | Resp 16 | Ht 70.5 in | Wt 209.0 lb

## 2018-04-21 DIAGNOSIS — G4733 Obstructive sleep apnea (adult) (pediatric): Secondary | ICD-10-CM

## 2018-04-21 DIAGNOSIS — Z9989 Dependence on other enabling machines and devices: Secondary | ICD-10-CM | POA: Diagnosis not present

## 2018-04-21 DIAGNOSIS — I2583 Coronary atherosclerosis due to lipid rich plaque: Secondary | ICD-10-CM

## 2018-04-21 DIAGNOSIS — I2510 Atherosclerotic heart disease of native coronary artery without angina pectoris: Secondary | ICD-10-CM

## 2018-04-21 NOTE — Progress Notes (Signed)
[facility], [place] [address]  Pulmonary Sleep Medicine   Office Visit Note  Patient Name: Tony Manning DOB: July 13, 1961 MRN [id number]  Date of Service: 04/21/2018  Complaints/HPI: Pt here for follow for OSA.  He reports using his CPAP every night, he is sleeping well most nights he reports getting 5-6 hours of sleep.  He denies mask leak, and states he is cleaning his equipment with "SOclean" machine.    ROS  General: (-) fever, (-) chills, (-) night sweats, (-) weakness Skin: (-) rashes, (-) itching,. Eyes: (-) visual changes, (-) redness, (-) itching. Nose and Sinuses: (-) nasal stuffiness or itchiness, (-) postnasal drip, (-) nosebleeds, (-) sinus trouble. Mouth and Throat: (-) sore throat, (-) hoarseness. Neck: (-) swollen glands, (-) enlarged thyroid, (-) neck pain. Respiratory: - cough, (-) bloody sputum, - shortness of breath, - wheezing. Cardiovascular: - ankle swelling, (-) chest pain. Lymphatic: (-) lymph node enlargement. Neurologic: (-) numbness, (-) tingling. Psychiatric: (-) anxiety, (-) depression   Current Medication: Outpatient Encounter Medications as of 04/21/2018  Medication Sig  . aspirin 81 MG chewable tablet Chew 81 mg by mouth at bedtime.  [name] Kitchen atorvastatin (LIPITOR) 40 MG tablet TAKE 1 TABLET (40 MG TOTAL) BY MOUTH DAILY.  . carvedilol (COREG) 6.25 MG tablet Take 6.25 mg by mouth 2 (two) times daily with a meal.  . clopidogrel (PLAVIX) 75 MG tablet Take by mouth.  . diclofenac sodium (VOLTAREN) 1 % GEL Apply 2-4 g topically 4 (four) times daily.  [name] Kitchen glimepiride (AMARYL) 4 MG tablet TAKE 1 TABLET BY MOUTH TWICE A DAY  . glucose blood (ONE TOUCH ULTRA TEST) test strip Check sugar up to 6 times a day.  DM2, insulin treated, uncontrolled.  . Insulin Detemir (LEVEMIR FLEXTOUCH) 100 UNIT/ML Pen INJECT 40 UNITS SUBQ EVERY MORN. BS > 120: ADD 1 UNIT, BS <80: SUBTRACT 1 UNIT. BS 80-120: NO CHANGE  . Insulin Pen Needle 32G  X 4 MM MISC Use daily with insulin pen.  . losartan (COZAAR) 100 MG tablet Take 0.5 tablets (50 mg total) by mouth 2 (two) times daily.  . metFORMIN (GLUCOPHAGE) 1000 MG tablet TAKE 1 TABLET BY MOUTH TWICE A DAY WITH A MEAL  . nitroGLYCERIN (NITROSTAT) 0.4 MG SL tablet Place 1 tablet (0.4 mg total) under the tongue every 5 (five) minutes as needed for chest pain.  [name] Kitchen oxyCODONE (ROXICODONE) 5 MG immediate release tablet Take 1 tablet (5 mg total) by mouth every 6 (six) hours as needed for severe pain (for kidney stone).  . ranitidine (ZANTAC) 75 MG tablet Take 75 mg by mouth daily.  . tamsulosin (FLOMAX) 0.4 MG CAPS capsule TAKE 1 CAPSULE (0.4 MG TOTAL) BY MOUTH DAILY.  [name] Kitchen Vitamin D, Ergocalciferol, (DRISDOL) 50000 units CAPS capsule Take 1 capsule (50,000 Units total) by mouth every 7 (seven) days.   No facility-administered encounter medications on file as of 04/21/2018.     Surgical History: Past Surgical History:  Procedure Laterality Date  . ANTERIOR CERVICAL DECOMP/DISCECTOMY FUSION N/A 04/07/2017   Procedure: ANTERIOR CERVICAL DECOMPRESSION FUSION, CERVICAL 4-5 WITH INSTRUMENTATION AND ALLOGRAFT;  Surgeon: Phylliss Bob, MD;  Location: [facility];  Service: Orthopedics;  Laterality: N/A;  ANTERIOR CERVICAL DECOMPRESSION FUSION, CERVICAL 4-5 WITH INSTRUMENTATION AND ALLOGRAFT; REQUEST 2.5 HOURS AND FLIP ROOM  . APPENDECTOMY  2005  . CARPAL TUNNEL RELEASE    . CERVICAL FUSION  2009  . CORONARY ANGIOPLASTY WITH STENT PLACEMENT    . CORONARY STENT INTERVENTION N/A  10/16/2016   Procedure: Coronary Stent Intervention;  Surgeon: Adrian Prows, MD;  Location: [facility] CV LAB;  Service: Cardiovascular;  Laterality: N/A;  . ELBOW SURGERY     bilateral  . KNEE SURGERY     Bilateral   . LEFT HEART CATH AND CORONARY ANGIOGRAPHY N/A 10/16/2016   Procedure: Left Heart Cath and Coronary Angiography;  Surgeon: Adrian Prows, MD;  Location: [place] [facility];  Service: Cardiovascular;  Laterality: N/A;  . LEFT HEART  CATHETERIZATION WITH CORONARY ANGIOGRAM N/A 06/14/2014   Procedure: LEFT HEART CATHETERIZATION WITH CORONARY ANGIOGRAM;  Surgeon: Laverda Page, MD;  Location[facility] CATH LAB;  Service: Cardiovascular;  Laterality: N/A;  . LUNG BIOPSY     sarcoid  . ROTATOR CUFF REPAIR     left    Medical History: Past Medical History:  Diagnosis Date  . Anginal pain ([place])   . Basal cell carcinoma of face   . Chest pain, unspecified   . Coronary atherosclerosis of native coronary artery    s/p stent x4  . Depression   . Diabetes mellitus without mention of complication   . Diverticulosis   . Dyspnea   . Dysrhythmia   . Erectile dysfunction   . Essential hypertension, benign   . GERD (gastroesophageal reflux disease)   . Heart murmur   . History of kidney stones   . History of nephrolithiasis   . Migraine   . Myocardial infarction ([place])   . Neuromuscular disorder ([place])   . Osteoarthritis   . Pneumonia   . Postsurgical percutaneous transluminal coronary angioplasty status    s/p stent x4  . Sarcoidosis   . Unspecified sleep apnea     Family History: Family History  Problem Relation Age of Onset  . Arthritis Mother   . Depression Mother   . Stroke Mother   . Arthritis Father   . Diabetes Father   . Coronary artery disease Father   . Colon cancer Neg Hx   . Prostate cancer Neg Hx   . Esophageal cancer Neg Hx   . Rectal cancer Neg Hx   . Stomach cancer Neg Hx     Social History: Social History   Socioeconomic History  . Marital status: Married    Spouse name: Not on file  . Number of children: 2  . Years of education: Not on file  . Highest education level: Not on file  Occupational History  . Occupation: disabled    Fish farm manager: unemployed  Social Needs  . Financial resource strain: Not on file  . Food insecurity:    Worry: Not on file    Inability: Not on file  . Transportation needs:    Medical: Not on file    Non-medical: Not on file  Tobacco Use  . Smoking  status: Never Smoker  . Smokeless tobacco: Former Systems developer    Types: Chew  . Tobacco comment: trying to quit  Substance and Sexual Activity  . Alcohol use: No    Alcohol/week: 0.0 standard drinks  . Drug use: No  . Sexual activity: Yes    Birth control/protection: Condom  Lifestyle  . Physical activity:    Days per week: Not on file    Minutes per session: Not on file  . Stress: Not on file  Relationships  . Social connections:    Talks on phone: Not on file    Gets together: Not on file    Attends religious service: Not on file  Active member of club or organization: Not on file    Attends meetings of clubs or organizations: Not on file    Relationship status: Not on file  . Intimate partner violence:    Fear of current or ex partner: Not on file    Emotionally abused: Not on file    Physically abused: Not on file    Forced sexual activity: Not on file  Other Topics Concern  . Not on file  Social History Narrative   Married, 1987   2 kids   On disability    Vital Signs: Blood pressure 137/69, pulse 60, resp. rate 16, height 5' 10.5" (1.791 m), weight 209 lb (94.8 kg), SpO2 99 %.  Examination: General Appearance: The patient is well-developed, well-nourished, and in no distress. Skin: Gross inspection of skin unremarkable. Head: normocephalic, no gross deformities. Eyes: no gross deformities noted. ENT: ears appear grossly normal no exudates. Neck: Supple. No thyromegaly. No LAD. Respiratory: Clear to ausculation bilatrally. Cardiovascular: Normal S1 and S2 without murmur or rub. Extremities: No cyanosis. pulses are equal. Neurologic: Alert and oriented. No involuntary movements.  LABS: Recent Results (from the past 2160 hour(s))  POCT Urinalysis Dipstick (Automated)     Status: Abnormal   Collection Time: 03/21/18 11:33 AM  Result Value Ref Range   Color, UA Yellow    Clarity, UA Clear    Glucose, UA Positive (A) Negative   Bilirubin, UA Neg    Ketones, UA  Neg    Spec Grav, UA >=1.030 (A) 1.010 - 1.025   Blood, UA 1+    pH, UA 6.0 5.0 - 8.0   Protein, UA Negative Negative   Urobilinogen, UA 0.2 0.2 or 1.0 E.U./dL   Nitrite, UA Neg    Leukocytes, UA Negative Negative  Urine Culture     Status: None   Collection Time: 03/21/18 11:45 AM  Result Value Ref Range   MICRO NUMBER: [id number]    SPECIMEN QUALITY: ADEQUATE    Sample Source NOT GIVEN    STATUS: FINAL    Result: No Growth   Hemoglobin A1c     Status: Abnormal   Collection Time: 03/21/18 12:05 PM  Result Value Ref Range   Hgb A1c MFr Bld 7.8 (H) 4.6 - 6.5 %    Comment: Glycemic Control Guidelines for People with Diabetes:Non Diabetic:  <6%Goal of Therapy: <7%Additional Action Suggested:  >8%   TSH     Status: None   Collection Time: 03/21/18 12:05 PM  Result Value Ref Range   TSH 2.22 0.35 - 4.50 uIU/mL  VITAMIN D 25 Hydroxy (Vit-D Deficiency, Fractures)     Status: Abnormal   Collection Time: 03/21/18 12:05 PM  Result Value Ref Range   VITD 19.37 (L) 30.00 - 100.00 ng/mL    Radiology: Dg Abd 1 View  Result Date: 03/21/2018 CLINICAL DATA:  Right flank pain EXAM: ABDOMEN - 1 VIEW COMPARISON:  CT, 08/30/2017 FINDINGS: 5 mm well corticated bone density projects just lateral to the right upper endplate of L4. This was present on the prior CT consistent opacification along the disc annulus. No evidence of a renal or ureteral stone. Normal bowel gas pattern. Short anastomosis staple line adjacent to the cecum consistent with a prior appendectomy. No significant skeletal abnormality. IMPRESSION: 1. No acute findings. 2. No evidence a renal or ureteral stone Electronically Signed   By: Lajean Manes M.D.   On: 03/21/2018 13:38    No results found.  No results found.  Assessment and Plan: Patient Active Problem List   Diagnosis Date Noted  . Hematuria 03/22/2018  . Renal stones 03/22/2018  . Skin infection 12/18/2017  . Vitamin D deficiency, unspecified 10/04/2017  .  Chronic neck pain (Primary Area of Pain) (B) (L>R) 09/21/2017  . Chronic upper extremity pain (Secondary Area of Pain) (Bilateral) (L>R) 09/21/2017  . Chronic left shoulder pain (Tertiary Area of Pain) 09/21/2017  . Chronic low back pain (Fourth Area of Pain) (B) (R>L) 09/21/2017  . Chronic pain of lower extremity (B) (R>L) 09/21/2017  . Disorder of bone, unspecified 09/21/2017  . Other long term (current) drug therapy 09/21/2017  . Other specified health status 09/21/2017  . Other chronic pain 08/22/2017  . Other fatigue 05/10/2017  . Radiculopathy 04/07/2017  . LLQ pain 10/21/2016  . Angina pectoris ([place]) 10/15/2016  . Cervical radiculitis 07/23/2016  . Dysphagia 06/12/2016  . Sciatica 02/06/2016  . Plantar fasciitis 01/16/2016  . Right sided sciatica 05/16/2015  . Radicular pain in right arm 04/05/2015  . Hand pain 04/05/2015  . Premature ejaculation 09/17/2014  . Heel pain 09/17/2014  . NSTEMI (non-ST elevated myocardial infarction) ([place]) 06/13/2014  . Atypical chest pain 06/06/2014  . Memory change 05/24/2014  . Benign paroxysmal positional vertigo 07/13/2013  . Orthostasis 07/13/2013  . Migraine, unspecified, without mention of intractable migraine without mention of status migrainosus 04/03/2013  . Medicare annual wellness visit, initial 02/17/2013  . Pain in joint, ankle and foot 10/19/2012  . Irritable mood 01/18/2012  . Cough 12/21/2011  . Tobacco abuse 12/21/2011  . Skin lesion of face 04/27/2011  . Neck pain 12/03/2010  . Hearing loss 12/03/2010  . 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 HYPERCHOLESTEROLEMIA 10/20/2010  . DEPRESSION 10/20/2010  . OSTEOARTHRITIS 10/20/2010  . ARTHRITIS 10/20/2010  . NEPHROLITHIASIS, HX OF 10/20/2010  . CHICKENPOX, HX OF 10/20/2010  . ESSENTIAL HYPERTENSION, BENIGN 09/30/2010  . CORONARY ATHEROSCLEROSIS NATIVE CORONARY ARTERY 09/30/2010  . SLEEP APNEA 09/30/2010  . CHEST PAIN UNSPECIFIED 09/30/2010   . PERCUTANEOUS TRANSLUMINAL CORONARY ANGIOPLASTY, HX OF 09/30/2010    1. OSA on CPAP Pt doing well, continue compliance with CPAP.  Follow up in 6 months.  2. Coronary artery disease due to lipid rich plaque Follow up with cardiology as scheduled.  Continue current therapy.  3. Morbid obesity ([place]) Obesity Counseling: Risk Assessment: An assessment of behavioral risk factors was made today and includes lack of exercise sedentary lifestyle, lack of portion control and poor dietary habits.  Risk Modification Advice: She was counseled on portion control guidelines. Restricting daily caloric intake to. . The detrimental long term effects of obesity on her health and ongoing poor compliance was also discussed with the patient.    General Counseling: I have discussed the findings of the evaluation and examination with Harrell Gave.  I have also discussed any further diagnostic evaluation thatmay be needed or ordered today. Traycen verbalizes understanding of the findings of todays visit. We also reviewed his medications today and discussed drug interactions and side effects including but not limited excessive drowsiness and altered mental states. We also discussed that there is always a risk not just to him but also people around him. he has been encouraged to call the office with any questions or concerns that should arise related to todays visit.    Time spent: 25 This patient was seen by Orson Gear AGNP-C in Collaboration with Dr. Devona Konig as a part of collaborative care agreement.   I have personally obtained  a history, examined the patient, evaluated laboratory and imaging results, formulated the assessment and plan and placed orders.    Allyne Gee, MD [facility] Pulmonary and Critical Care Sleep medicine

## 2018-04-21 NOTE — Patient Instructions (Signed)

## 2018-05-04 DIAGNOSIS — M67912 Unspecified disorder of synovium and tendon, left shoulder: Secondary | ICD-10-CM | POA: Diagnosis not present

## 2018-05-04 DIAGNOSIS — M25522 Pain in left elbow: Secondary | ICD-10-CM | POA: Diagnosis not present

## 2018-05-05 ENCOUNTER — Encounter: Payer: Self-pay | Admitting: Family Medicine

## 2018-05-05 ENCOUNTER — Ambulatory Visit (INDEPENDENT_AMBULATORY_CARE_PROVIDER_SITE_OTHER): Payer: Medicare HMO | Admitting: Family Medicine

## 2018-05-05 VITALS — BP 138/70 | HR 57 | Temp 98.0°F | Ht 70.5 in | Wt 209.5 lb

## 2018-05-05 DIAGNOSIS — R197 Diarrhea, unspecified: Secondary | ICD-10-CM

## 2018-05-05 LAB — COMPREHENSIVE METABOLIC PANEL
ALT: 23 U/L (ref 0–53)
AST: 17 U/L (ref 0–37)
Albumin: 4.5 g/dL (ref 3.5–5.2)
Alkaline Phosphatase: 77 U/L (ref 39–117)
BUN: 19 mg/dL (ref 6–23)
CO2: 27 mEq/L (ref 19–32)
Calcium: 10.3 mg/dL (ref 8.4–10.5)
Chloride: 106 mEq/L (ref 96–112)
Creatinine, Ser: 1.24 mg/dL (ref 0.40–1.50)
GFR: 63.6 mL/min (ref >60.00)
Glucose, Bld: 233 mg/dL — ABNORMAL HIGH (ref 70–99)
Potassium: 5.1 mEq/L (ref 3.5–5.1)
Sodium: 138 mEq/L (ref 135–145)
Total Bilirubin: 0.4 mg/dL (ref 0.2–1.2)
Total Protein: 7.7 g/dL (ref 6.0–8.3)

## 2018-05-05 LAB — CBC WITH DIFFERENTIAL/PLATELET
Basophils Absolute: 0.1 10*3/uL (ref 0.0–0.1)
Basophils Relative: 0.7 % (ref 0.0–3.0)
Eosinophils Absolute: 0.3 10*3/uL (ref 0.0–0.7)
Eosinophils Relative: 3.4 % (ref 0.0–5.0)
HCT: 43.2 % (ref 39.0–52.0)
Hemoglobin: 14.6 g/dL (ref 13.0–17.0)
Lymphocytes Relative: 20.3 % (ref 12.0–46.0)
Lymphs Abs: 1.7 10*3/uL (ref 0.7–4.0)
MCHC: 33.8 g/dL (ref 30.0–36.0)
MCV: 88.4 fl (ref 78.0–100.0)
Monocytes Absolute: 0.6 10*3/uL (ref 0.1–1.0)
Monocytes Relative: 6.5 % (ref 3.0–12.0)
Neutro Abs: 6 10*3/uL (ref 1.4–7.7)
Neutrophils Relative %: 69.1 % (ref 43.0–77.0)
Platelets: 231 10*3/uL (ref 150.0–400.0)
RBC: 4.89 Mil/uL (ref 4.22–5.81)
RDW: 14.3 % (ref 11.5–15.5)
WBC: 8.6 10*3/uL (ref 4.0–10.5)

## 2018-05-05 NOTE — Progress Notes (Signed)
Stressors noted with dealing with his father's old home.  "I wonder if that is what is going on."  Last solid BM was about 1 month ago.  Diarrhea vs loose stools, worse initially, now usually 3-4x per day.  No blood in stool. No vomiting.  No mucous in stools known.  No black stools.  Can be greenish vs brown.    Imodium didn't help much.  No recent use of Imodium, not in the last few days.  No fevers.  No rash.  No abd pain except from renal stones.  He has fecal urgency.  Urine is a little darker recently.  He has passed some kidney stones recently.    Similar sx with other patients in the community recently.    He is avoiding spicy foods.  Well water.  No one else sick at home.    Meds, vitals, and allergies reviewed.   ROS: Per HPI unless specifically indicated in ROS section   GEN: nad, alert and oriented HEENT: mucous membranes moist NECK: supple w/o LA CV: rrr. PULM: ctab, no inc wob ABD: soft, +bs, not ttp  EXT: no edema SKIN: no acute rash

## 2018-05-05 NOTE — Patient Instructions (Signed)
Cut back on the metformin for a few days and see if that helps.  Go to the lab on the way out.  We'll contact you with your lab report. Drink enough fluid to keep your urine light colored.  Take care.  Glad to see you.

## 2018-05-06 ENCOUNTER — Other Ambulatory Visit: Payer: Self-pay | Admitting: Orthopedic Surgery

## 2018-05-06 ENCOUNTER — Telehealth: Payer: Self-pay | Admitting: *Deleted

## 2018-05-06 DIAGNOSIS — R197 Diarrhea, unspecified: Secondary | ICD-10-CM | POA: Diagnosis not present

## 2018-05-06 NOTE — Telephone Encounter (Signed)
[facility] called to say that the surgical clearance that was faxed over was from Dr. Tania Ade.  The form was sent without that information included.

## 2018-05-09 DIAGNOSIS — R197 Diarrhea, unspecified: Secondary | ICD-10-CM | POA: Insufficient documentation

## 2018-05-09 NOTE — Telephone Encounter (Signed)
This needs to come through the patient's cardiologist.  I will defer.  Thanks.

## 2018-05-09 NOTE — Assessment & Plan Note (Signed)
Unclear source.  Still okay for outpatient follow-up.  Discussed with patient about differential diagnosis.  Unclear if he could have a self-limited infectious process.  Unclear if metformin contributes.  He will cut back on metformin in the meantime and update me.  See notes on labs.  Nontoxic.  Discussed with patient about maintaining adequate hydration.  See after visit summary.  Okay for outpatient follow-up.

## 2018-05-10 NOTE — Telephone Encounter (Signed)
Lisa at Dr. Bettina Gavia office notified as instructed by telephone and verbalized understanding. Spoke to patient and was advised that the form has been sent to his cardiologist also.

## 2018-05-12 DIAGNOSIS — G4733 Obstructive sleep apnea (adult) (pediatric): Secondary | ICD-10-CM | POA: Diagnosis not present

## 2018-05-12 LAB — GASTROINTESTINAL PATHOGEN PANEL PCR
C. difficile Tox A/B, PCR: NOT DETECTED
Campylobacter, PCR: NOT DETECTED
Cryptosporidium, PCR: NOT DETECTED
E coli (ETEC) LT/ST PCR: NOT DETECTED
E coli (STEC) stx1/stx2, PCR: NOT DETECTED
E coli 0157, PCR: NOT DETECTED
Giardia lamblia, PCR: NOT DETECTED
Norovirus, PCR: NOT DETECTED
Rotavirus A, PCR: NOT DETECTED
Salmonella, PCR: NOT DETECTED
Shigella, PCR: NOT DETECTED

## 2018-05-13 ENCOUNTER — Other Ambulatory Visit (INDEPENDENT_AMBULATORY_CARE_PROVIDER_SITE_OTHER): Payer: Medicare HMO

## 2018-05-13 ENCOUNTER — Other Ambulatory Visit: Payer: Self-pay | Admitting: Family Medicine

## 2018-05-13 DIAGNOSIS — R197 Diarrhea, unspecified: Secondary | ICD-10-CM

## 2018-05-13 LAB — FECAL OCCULT BLOOD, IMMUNOCHEMICAL: Fecal Occult Bld: NEGATIVE

## 2018-05-24 ENCOUNTER — Telehealth: Payer: Self-pay | Admitting: Family Medicine

## 2018-05-24 ENCOUNTER — Telehealth: Payer: Self-pay | Admitting: Internal Medicine

## 2018-05-24 DIAGNOSIS — E1149 Type 2 diabetes mellitus with other diabetic neurological complication: Secondary | ICD-10-CM

## 2018-05-24 DIAGNOSIS — Z794 Long term (current) use of insulin: Secondary | ICD-10-CM

## 2018-05-24 NOTE — Telephone Encounter (Signed)
Patient has been advised for cpap compliance he will need to bring his machine in for download.

## 2018-05-24 NOTE — Telephone Encounter (Signed)
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 Please call patient when this is completed

## 2018-05-24 NOTE — Telephone Encounter (Signed)
Pt came in and dropped of DOT forms that need to be filled out by doctor concerning his diabetes. Put in [employer]

## 2018-05-24 NOTE — Telephone Encounter (Signed)
Form is on your desk.

## 2018-05-25 NOTE — Telephone Encounter (Signed)
I'll work on the hard copy.  Thanks.  

## 2018-05-26 DIAGNOSIS — R0789 Other chest pain: Secondary | ICD-10-CM | POA: Diagnosis not present

## 2018-05-26 NOTE — Telephone Encounter (Signed)
Pt called back to check status of form. Pt would like a call back when ready.   Cb# [phone]

## 2018-05-26 NOTE — Telephone Encounter (Addendum)
We'll let him know when done.  Tomorrow at the earliest, likely next week.  Thanks.

## 2018-05-26 NOTE — Telephone Encounter (Signed)
Spoke to pt and advised that we will contact him when the form is available for pickup

## 2018-05-29 NOTE — Telephone Encounter (Signed)
Notify patient.  I have never seen a form like this before.  Upon reviewing it, it appears that he will need to maintain 3 months worth of ongoing blood glucose monitoring records with an electronic glucometer that stores all the readings, records the date, the times of the readings, and then he will need to provide a download of that data.  I do not have the ability to download his data here in clinic.  We can refer him to endocrinology if he desires.  I am unable to fill out the form at this point.

## 2018-05-30 NOTE — Addendum Note (Signed)
Addended by: Tonia Ghent on: 05/30/2018 09:53 PM   Modules accepted: Orders

## 2018-05-30 NOTE — Telephone Encounter (Signed)
Ordered referral. Thanks.  

## 2018-05-30 NOTE — Telephone Encounter (Signed)
Patient notified as instructed by telephone and verbalized understanding. Patient stated that he would like a referral to endocrinology.  Left paperwork up front for patient to pickup per his request. Advised patient that one of the referral coordinators will be in touch to get this set up for him.

## 2018-06-02 DIAGNOSIS — R69 Illness, unspecified: Secondary | ICD-10-CM | POA: Diagnosis not present

## 2018-06-06 NOTE — Pre-Procedure Instructions (Signed)
Tony Manning  06/06/2018      [id number] - [place], [place] [address] [place] [address] Phone: [phone] Fax: [phone]    Your procedure is scheduled on 06-16-2018 .Thursday   Report to [facility] Admitting at 8:00 A.M.   Call this number if you have problems the morning of surgery:  [phone]   Remember:  Do not eat food or drink liquids after midnight.  .                          Take these medicines the morning of surgery with A SIP OF WATER  Atorvastatin(Lipitor) Carvedilol(Coreg) Oxycodone if needed Ranitidine(Zantac) Tamsulosin(Flomax)  STOP TAKING ANY ASPIRIN (UNLESS OTHERWISE INSTRUCTED BY YOUR SURGEON),ANTIINFLAMATORIES (IBUPROFEN,ALEVE,MOTRIN,ADVIL,GOODY'S POWDERS),HERBAL SUPPLEMENTS,FISH OIL,AND VITAMINS 5-7 DAYS PRIOR TO SURGERY    Follow your surgeon's orders on when to stop Plavix . If no instructions were given call your pres prescribing physician for instructions.      How to Manage Your Diabetes Before and After Surgery  Why is it important to control my blood sugar before and after surgery? . Improving blood sugar levels before and after surgery helps healing and can limit problems. . A way of improving blood sugar control is eating a healthy diet by: o  Eating less sugar and carbohydrates o  Increasing activity/exercise o  Talking with your doctor about reaching your blood sugar goals . High blood sugars (greater than 180 mg/dL) can raise your risk of infections and slow your recovery, so you will need to focus on controlling your diabetes during the weeks before surgery. . Make sure that the doctor who takes care of your diabetes knows about your planned surgery including the date and location.  How do I manage my blood sugar before surgery? . Check your blood sugar at least 4 times a day, starting 2 days before surgery, to make sure that the level is not too high or  low. o Check your blood sugar the morning of your surgery when you wake up and every 2 hours until you get to the Short Stay unit. . If your blood sugar is less than 70 mg/dL, you will need to treat for low blood sugar: o Do not take insulin. o Treat a low blood sugar (less than 70 mg/dL) with  cup of clear juice (cranberry or apple), 4 glucose tablets, OR glucose gel. Recheck blood sugar in 15 minutes after treatment (to make sure it is greater than 70 mg/dL). If your blood sugar is not greater than 70 mg/dL on recheck, call [phone] o  for further instructions. . Report your blood sugar to the short stay nurse when you get to Short Stay.  . If you are admitted to the hospital after surgery: o Your blood sugar will be checked by the staff and you will probably be given insulin after surgery (instead of oral diabetes medicines) to make sure you have good blood sugar levels. o The goal for blood sugar control after surgery is 80-180 mg/dL.              WHAT DO I DO ABOUT MY DIABETES MEDICATION?   [name] Kitchen Do not take oral diabetes medicines (pills) the morning of surgery.Metformin)Glucophage),glimepiride(Amaryl)  . [name] Kitchen       THE MORNING OF SURGERY, take 1/2 of usual morning dose of Levemir____________   . If your CBG is greater than 220 mg/dL, you may  take  of your sliding scale (correction) dose of insulin.  Other Instructions:          Patient Signature:  Date:   . Nurse Signature: ER), V (dulaglutide). Date:   Reviewed and Endorsed by [facility] Patient Education Committee, August 2015   [facility] - Preparing for Surgery  Before surgery, you can play an important role.  Because skin is not sterile, your skin needs to be as free of germs as possible.  You can reduce the number of germs on you skin by washing with CHG (chlorahexidine gluconate) soap before surgery.  CHG is an antiseptic cleaner which kills germs and bonds with the skin to continue killing germs  even after washing.  Oral Hygiene is also important in reducing the risk of infection.  Remember to brush your teeth with your regular toothpaste the morning of surgery.  Please DO NOT use if you have an allergy to CHG or antibacterial soaps.  If your skin becomes reddened/irritated stop using the CHG and inform your nurse when you arrive at Short Stay.  Do not shave (including legs and underarms) for at least 48 hours prior to the first CHG shower.  You may shave your face.  Please follow these instructions carefully:   1.  Shower with CHG Soap the night before surgery and the morning of Surgery.  2.  If you choose to wash your hair, wash your hair first as usual with your normal shampoo.  3.  After you shampoo, rinse your hair and body thoroughly to remove the shampoo. 4.  Use CHG as you would any other liquid soap.  You can apply chg directly to the skin and wash gently with a      scrungie or washcloth.           5.  Apply the CHG Soap to your body ONLY FROM THE NECK DOWN.   Do not use on open wounds or open sores. Avoid contact with your eyes, ears, mouth and genitals (private parts).  Wash genitals (private parts) with your normal soap.  6.  Wash thoroughly, paying special attention to the area where your surgery will be performed.  7.  Thoroughly rinse your body with warm water from the neck down.  8.  DO NOT shower/wash with your normal soap after using and rinsing off the CHG Soap.  9.  Pat yourself dry with a clean towel.            10.  Wear clean pajamas.            11.  Place clean sheets on your bed the night of your first shower and do not sleep with pets.  Day of Surgery  Do not apply any lotions/deoderants the morning of surgery.   Please wear clean clothes to the hospital/surgery center. Remember to brush your teeth with toothpaste.      Do not wear jewelry, make-up or nail polish.  Do not wear lotions, powders, or perfumes, or deodorant.  Do not shave 48 hours prior  to surgery.  Men may shave face and neck.  Do not bring valuables to the hospital.  [facility] is not responsible for any belongings or valuables.  Contacts, dentures or bridgework may not be worn into surgery.  Leave your suitcase in the car.  After surgery it may be brought to your room.  For patients admitted to the hospital, discharge time will be determined by your treatment team.  Patients discharged  the day of surgery will not be allowed to drive home.     Please read over the following fact sheets that you were given. Pain Booklet, Coughing and Deep Breathing and Surgical Site Infection Prevention

## 2018-06-07 ENCOUNTER — Other Ambulatory Visit: Payer: Self-pay

## 2018-06-07 ENCOUNTER — Encounter (HOSPITAL_COMMUNITY): Payer: Self-pay

## 2018-06-07 ENCOUNTER — Encounter (HOSPITAL_COMMUNITY)
Admission: RE | Admit: 2018-06-07 | Discharge: 2018-06-07 | Disposition: A | Discharge status: Home / Self Care | Payer: Medicare HMO | Source: Ambulatory Visit | Attending: Orthopedic Surgery | Admitting: Orthopedic Surgery

## 2018-06-07 DIAGNOSIS — I252 Old myocardial infarction: Secondary | ICD-10-CM | POA: Diagnosis not present

## 2018-06-07 DIAGNOSIS — Z7902 Long term (current) use of antithrombotics/antiplatelets: Secondary | ICD-10-CM | POA: Diagnosis not present

## 2018-06-07 DIAGNOSIS — Z79899 Other long term (current) drug therapy: Secondary | ICD-10-CM | POA: Insufficient documentation

## 2018-06-07 DIAGNOSIS — F329 Major depressive disorder, single episode, unspecified: Secondary | ICD-10-CM | POA: Insufficient documentation

## 2018-06-07 DIAGNOSIS — K219 Gastro-esophageal reflux disease without esophagitis: Secondary | ICD-10-CM | POA: Insufficient documentation

## 2018-06-07 DIAGNOSIS — E119 Type 2 diabetes mellitus without complications: Secondary | ICD-10-CM | POA: Insufficient documentation

## 2018-06-07 DIAGNOSIS — I10 Essential (primary) hypertension: Secondary | ICD-10-CM | POA: Insufficient documentation

## 2018-06-07 DIAGNOSIS — Z794 Long term (current) use of insulin: Secondary | ICD-10-CM | POA: Insufficient documentation

## 2018-06-07 DIAGNOSIS — M25812 Other specified joint disorders, left shoulder: Secondary | ICD-10-CM | POA: Diagnosis not present

## 2018-06-07 DIAGNOSIS — N529 Male erectile dysfunction, unspecified: Secondary | ICD-10-CM | POA: Insufficient documentation

## 2018-06-07 DIAGNOSIS — I2510 Atherosclerotic heart disease of native coronary artery without angina pectoris: Secondary | ICD-10-CM | POA: Insufficient documentation

## 2018-06-07 DIAGNOSIS — R69 Illness, unspecified: Secondary | ICD-10-CM | POA: Diagnosis not present

## 2018-06-07 DIAGNOSIS — Z01818 Encounter for other preprocedural examination: Secondary | ICD-10-CM | POA: Insufficient documentation

## 2018-06-07 DIAGNOSIS — M75102 Unspecified rotator cuff tear or rupture of left shoulder, not specified as traumatic: Secondary | ICD-10-CM | POA: Insufficient documentation

## 2018-06-07 DIAGNOSIS — G4730 Sleep apnea, unspecified: Secondary | ICD-10-CM | POA: Insufficient documentation

## 2018-06-07 DIAGNOSIS — Z7982 Long term (current) use of aspirin: Secondary | ICD-10-CM | POA: Diagnosis not present

## 2018-06-07 LAB — CBC
HCT: 43.2 % (ref 39.0–52.0)
Hemoglobin: 14.1 g/dL (ref 13.0–17.0)
MCH: 29.4 pg (ref 26.0–34.0)
MCHC: 32.6 g/dL (ref 30.0–36.0)
MCV: 90.2 fL (ref 78.0–100.0)
Platelets: 210 10*3/uL (ref 150–400)
RBC: 4.79 MIL/uL (ref 4.22–5.81)
RDW: 13.6 % (ref 11.5–15.5)
WBC: 8 10*3/uL (ref 4.0–10.5)

## 2018-06-07 LAB — BASIC METABOLIC PANEL
Anion gap: 8 (ref 5–15)
BUN: 17 mg/dL (ref 6–20)
CO2: 24 mmol/L (ref 22–32)
Calcium: 9.2 mg/dL (ref 8.9–10.3)
Chloride: 106 mmol/L (ref 98–111)
Creatinine, Ser: 1.26 mg/dL — ABNORMAL HIGH (ref 0.61–1.24)
GFR calc Af Amer: >60 mL/min (ref >60)
GFR calc non Af Amer: >60 mL/min (ref >60)
Glucose, Bld: 235 mg/dL — ABNORMAL HIGH (ref 70–99)
Potassium: 4 mmol/L (ref 3.5–5.1)
Sodium: 138 mmol/L (ref 135–145)

## 2018-06-07 LAB — GLUCOSE, CAPILLARY: Glucose-Capillary: 191 mg/dL — ABNORMAL HIGH (ref 70–99)

## 2018-06-07 NOTE — Progress Notes (Addendum)
PCP: [name], MD  Cardiologist: Adrian Prows, MD  EKG: requested from Dr. Einar Gip  Stress test: 05/31/16 in EPIC  ECHO: 02/01/18 in EPIC  Cardiac Cath: 10/16/16 in EPIC  Chest x-ray: 12/30/17 in Epic  Patient on Plavix-last dose 06/10/18 per Dr. Tamera Punt

## 2018-06-08 LAB — HEMOGLOBIN A1C
Hgb A1c MFr Bld: 8.6 % — ABNORMAL HIGH (ref 4.8–5.6)
Mean Plasma Glucose: 200 mg/dL

## 2018-06-08 NOTE — Progress Notes (Addendum)
Anesthesia Chart Review:  Case:  [id number] Date/Time:  06/16/18 0939   Procedure:  LEFT SHOULDER ARTHROSCOPY WITH ROTATOR CUFF DEBRIDEMENT AND  SUBACROMIAL DECOMPRESSION (Left )   Anesthesia type:  Choice   Pre-op diagnosis:  LEFT SHOULDER ROTATOR CUFF TEAR, TENDINODIS, IMPINGEMENT   Location:  [place] OR ROOM 07 / [place] OR   Surgeon:  Tania Ade, MD      DISCUSSION: Patient is a 57 year old male scheduled for the above procedure.  History includes never smoker, HTN, CAD (MI 11/2005 and STEMI 06/14/14; s/p DES LAD and PTCA OM1 07/30/03; s/p DES RCA 11/19/05; s/p thrombectomy/DES RCA and DES LAD 06/14/14; DES RCA 10/16/16), dysrhythmia (not specified), murmur, DM2, OSA, Sarcoidosis, dyspnea, GERD, neuromuscular disorder (not specified). BMI is consistent with mild obesity.   His cardiologist is Dr. Einar Gip. On 05/06/18, he wrote that he did not seen any contraindication for shoulder surgery from a cardiac standpoint. Patient did have an acute visit at [facility] on 05/26/18 with Jeri Lager, FNP-C for left arm pain which was felt to be low suspicion for cardiac etiology and more likely referred pain from his left shoulder orthopedic issues. EKG was stable. Troponin was negative. Continued medical therapy recommended. Patient last had a DES RCA on 10/16/16 and had a low risk stress test < 5 months ago for evaluation of chest pain. Last dose Plavix 06/10/18.   Anesthesiologist to evaluate on the day of surgery.    VS: BP 138/73   Pulse 66   Temp 36.5 C   Resp 20   Ht 5\' 10"  (1.778 m)   Wt 95.6 kg   SpO2 100%   BMI 30.23 kg/m   PROVIDERS: Tonia Ghent, MD is PCP Adrian Prows, MD is cardiologist.     LABS: Preoperative labs noted. A1c 8.6--result routed to Dr. Tamera Punt.  (all labs ordered are listed, but only abnormal results are displayed)  Labs Reviewed  GLUCOSE, CAPILLARY - Abnormal; Notable for the following components:      Result Value   Glucose-Capillary 191 (*)    All  other components within normal limits  BASIC METABOLIC PANEL - Abnormal; Notable for the following components:   Glucose, Bld 235 (*)    Creatinine, Ser 1.26 (*)    All other components within normal limits  HEMOGLOBIN A1C - Abnormal; Notable for the following components:   Hgb A1c MFr Bld 8.6 (*)    All other components within normal limits  CBC   Lab Results  Component Value Date   CREATININE 1.26 (H) 06/07/2018   CREATININE 1.24 05/05/2018   CREATININE 1.05 12/07/2017    IMAGES: CXR 12/30/17: IMPRESSION: Scarring and postoperative change right lower lobe. No edema or consolidation. Heart size within normal limits.   EKG: 05/26/18 [facility] CV): NSR   CV: Echo 02/01/18 [facility] CV): Conclusions: 1.  Left ventricle cavity is normal in size.  Normal global wall motion.  Indeterminate diastolic filling pattern, indeterminate LAP.  Calculated EF 55%. 2.  No significant valvular abnormality. 3.  Inadequate tricuspid regurgitation jet to estimate pulmonary artery pressure.  Normal right atrial pressure.  Nuclear stress test 01/07/18 ([place] CV): The overall quality of the study is excellent.  There is no evidence of abnormal lung activity.  Stress and rest SPECT images demonstrate homogeneous trace distribution throughout the myocardium.  Gated SPECT imaging reveals normal myocardial thickening and wall motion.  The left ventricular ejection fraction was normal at 60%.  Low risk study.  Cardiac cath 10/16/16:  Mid Cx to Dist Cx lesion, 65 %stenosed.  1st Mrg lesion, 90 %stenosed.  Dist LAD lesion, 40 %stenosed.  Mid LAD lesion, 0 %stenosed.  Prox RCA-1 lesion, 90 %stenosed.  A STENT RESOLUTE [id number] drug eluting stent was successfully placed, and overlaps previously placed stent.  Prox RCA-2 lesion, 60 %stenosed.  Post intervention, there is a 0% residual stenosis.  Mid RCA to Dist RCA lesion, 10 %stenosed.  Dist RCA lesion, 5 %stenosed.  Ost RPDA to RPDA  lesion, 60 %stenosed.  The left ventricular systolic function is normal.  LV end diastolic pressure is normal. - Severe diffuse triple-vessel coronary artery disease, circumflex coronary arteries diffusely diseased and very small calibered with multiple lesions. Highly OM1 is very large and is relatively well-preserved. OM 2 and 3 are diffusely diseased with OM 2 midsegment 90% stenosis.  - Mid LAD stents are widely patent with diffuse disease in the distal LAD.  - Proximal RCA has a 90% stenosis. Mid RCA stent has in-stent restenosis of 60%. Distal ICA stent is minimally narrowed. S/P 3.0 x 38 mm Onyx in the proximal RCA covering the previously placed mid RCA stent, 90% to 0%. - LVEF 55-60%. Normal LVEDP.   Past Medical History:  Diagnosis Date  . Anginal pain ([place])   . Basal cell carcinoma of face   . Chest pain, unspecified   . Coronary atherosclerosis of native coronary artery    s/p stent x4  . Depression   . Diabetes mellitus without mention of complication   . Diverticulosis   . Dyspnea   . Dysrhythmia   . Erectile dysfunction   . Essential hypertension, benign   . GERD (gastroesophageal reflux disease)   . Heart murmur   . History of kidney stones   . History of nephrolithiasis   . Migraine   . Myocardial infarction ([place])   . Neuromuscular disorder ([place])   . Osteoarthritis   . Pneumonia   . Postsurgical percutaneous transluminal coronary angioplasty status    s/p stent x4  . Sarcoidosis   . Unspecified sleep apnea     Past Surgical History:  Procedure Laterality Date  . ANTERIOR CERVICAL DECOMP/DISCECTOMY FUSION N/A 04/07/2017   Procedure: ANTERIOR CERVICAL DECOMPRESSION FUSION, CERVICAL 4-5 WITH INSTRUMENTATION AND ALLOGRAFT;  Surgeon: Phylliss Bob, MD;  Location: [place];  Service: Orthopedics;  Laterality: N/A;  ANTERIOR CERVICAL DECOMPRESSION FUSION, CERVICAL 4-5 WITH INSTRUMENTATION AND ALLOGRAFT; REQUEST 2.5 HOURS AND FLIP ROOM  . APPENDECTOMY  2005  . CARPAL  TUNNEL RELEASE    . CERVICAL FUSION  2009  . CORONARY ANGIOPLASTY WITH STENT PLACEMENT    . CORONARY STENT INTERVENTION N/A 10/16/2016   Procedure: Coronary Stent Intervention;  Surgeon: Adrian Prows, MD;  Location: [place] CV LAB;  Service: Cardiovascular;  Laterality: N/A;  . ELBOW SURGERY     bilateral  . KNEE SURGERY     Bilateral   . LEFT HEART CATH AND CORONARY ANGIOGRAPHY N/A 10/16/2016   Procedure: Left Heart Cath and Coronary Angiography;  Surgeon: Adrian Prows, MD;  Location: [place] CV LAB;  Service: Cardiovascular;  Laterality: N/A;  . LEFT HEART CATHETERIZATION WITH CORONARY ANGIOGRAM N/A 06/14/2014   Procedure: LEFT HEART CATHETERIZATION WITH CORONARY ANGIOGRAM;  Surgeon: Laverda Page, MD;  Location: [facility] CATH LAB;  Service: Cardiovascular;  Laterality: N/A;  . LUNG BIOPSY     sarcoid  . ROTATOR CUFF REPAIR     left    MEDICATIONS: . aspirin 81 MG chewable tablet  . atorvastatin (  LIPITOR) 40 MG tablet  . carvedilol (COREG) 6.25 MG tablet  . clopidogrel (PLAVIX) 75 MG tablet  . diclofenac sodium (VOLTAREN) 1 % GEL  . glimepiride (AMARYL) 4 MG tablet  . glucose blood (ONE TOUCH ULTRA TEST) test strip  . Insulin Detemir (LEVEMIR FLEXTOUCH) 100 UNIT/ML Pen  . Insulin Pen Needle 32G X 4 MM MISC  . losartan (COZAAR) 100 MG tablet  . metFORMIN (GLUCOPHAGE) 1000 MG tablet  . nitroGLYCERIN (NITROSTAT) 0.4 MG SL tablet  . oxyCODONE (ROXICODONE) 5 MG immediate release tablet  . ranitidine (ZANTAC) 150 MG tablet  . tamsulosin (FLOMAX) 0.4 MG CAPS capsule  . Vitamin D, Ergocalciferol, (DRISDOL) 50000 units CAPS capsule   No current facility-administered medications for this encounter.     George Hugh [facility] Short Stay Center/Anesthesiology Phone [phone] 06/09/2018 9:30 AM

## 2018-06-10 ENCOUNTER — Other Ambulatory Visit: Payer: Self-pay | Admitting: Family Medicine

## 2018-06-10 DIAGNOSIS — E1149 Type 2 diabetes mellitus with other diabetic neurological complication: Secondary | ICD-10-CM

## 2018-06-10 DIAGNOSIS — E559 Vitamin D deficiency, unspecified: Secondary | ICD-10-CM

## 2018-06-10 DIAGNOSIS — Z794 Long term (current) use of insulin: Secondary | ICD-10-CM

## 2018-06-10 NOTE — Telephone Encounter (Signed)
Electronic refill request. Drisdol Last office visit:   05/05/18 Last Filled:    12 capsule 0 03/22/2018  Please advise.

## 2018-06-12 NOTE — Telephone Encounter (Signed)
Needs repeat vitamin D level prior to refill.  Order is in EMR.  Thanks.

## 2018-06-13 NOTE — Telephone Encounter (Signed)
Left detailed message on voicemail.  

## 2018-06-16 ENCOUNTER — Ambulatory Visit (HOSPITAL_COMMUNITY): Payer: Medicare HMO | Admitting: Anesthesiology

## 2018-06-16 ENCOUNTER — Other Ambulatory Visit: Payer: Self-pay

## 2018-06-16 ENCOUNTER — Ambulatory Visit (HOSPITAL_COMMUNITY): Payer: Medicare HMO | Admitting: Vascular Surgery

## 2018-06-16 ENCOUNTER — Encounter (HOSPITAL_COMMUNITY)
Admission: RE | Disposition: A | Discharge status: Home / Self Care | Payer: Self-pay | Source: Ambulatory Visit | Attending: Orthopedic Surgery

## 2018-06-16 ENCOUNTER — Ambulatory Visit (HOSPITAL_COMMUNITY)
Admission: RE | Admit: 2018-06-16 | Discharge: 2018-06-16 | Disposition: A | Discharge status: Home / Self Care | Payer: Medicare HMO | Source: Ambulatory Visit | Attending: Orthopedic Surgery | Admitting: Orthopedic Surgery

## 2018-06-16 ENCOUNTER — Encounter (HOSPITAL_COMMUNITY): Payer: Self-pay | Admitting: *Deleted

## 2018-06-16 DIAGNOSIS — M7542 Impingement syndrome of left shoulder: Secondary | ICD-10-CM | POA: Insufficient documentation

## 2018-06-16 DIAGNOSIS — Z8249 Family history of ischemic heart disease and other diseases of the circulatory system: Secondary | ICD-10-CM | POA: Diagnosis not present

## 2018-06-16 DIAGNOSIS — I252 Old myocardial infarction: Secondary | ICD-10-CM | POA: Diagnosis not present

## 2018-06-16 DIAGNOSIS — G4730 Sleep apnea, unspecified: Secondary | ICD-10-CM | POA: Diagnosis not present

## 2018-06-16 DIAGNOSIS — M75112 Incomplete rotator cuff tear or rupture of left shoulder, not specified as traumatic: Secondary | ICD-10-CM | POA: Diagnosis not present

## 2018-06-16 DIAGNOSIS — Z7982 Long term (current) use of aspirin: Secondary | ICD-10-CM | POA: Diagnosis not present

## 2018-06-16 DIAGNOSIS — I2510 Atherosclerotic heart disease of native coronary artery without angina pectoris: Secondary | ICD-10-CM | POA: Diagnosis not present

## 2018-06-16 DIAGNOSIS — E119 Type 2 diabetes mellitus without complications: Secondary | ICD-10-CM | POA: Insufficient documentation

## 2018-06-16 DIAGNOSIS — Z79899 Other long term (current) drug therapy: Secondary | ICD-10-CM | POA: Insufficient documentation

## 2018-06-16 DIAGNOSIS — M7552 Bursitis of left shoulder: Secondary | ICD-10-CM | POA: Diagnosis not present

## 2018-06-16 DIAGNOSIS — Z794 Long term (current) use of insulin: Secondary | ICD-10-CM | POA: Diagnosis not present

## 2018-06-16 DIAGNOSIS — K219 Gastro-esophageal reflux disease without esophagitis: Secondary | ICD-10-CM | POA: Diagnosis not present

## 2018-06-16 DIAGNOSIS — Z87891 Personal history of nicotine dependence: Secondary | ICD-10-CM | POA: Insufficient documentation

## 2018-06-16 DIAGNOSIS — Z9989 Dependence on other enabling machines and devices: Secondary | ICD-10-CM | POA: Insufficient documentation

## 2018-06-16 DIAGNOSIS — R011 Cardiac murmur, unspecified: Secondary | ICD-10-CM | POA: Insufficient documentation

## 2018-06-16 DIAGNOSIS — M1990 Unspecified osteoarthritis, unspecified site: Secondary | ICD-10-CM | POA: Diagnosis not present

## 2018-06-16 DIAGNOSIS — M75102 Unspecified rotator cuff tear or rupture of left shoulder, not specified as traumatic: Secondary | ICD-10-CM | POA: Diagnosis not present

## 2018-06-16 DIAGNOSIS — Z85828 Personal history of other malignant neoplasm of skin: Secondary | ICD-10-CM | POA: Diagnosis not present

## 2018-06-16 DIAGNOSIS — Z981 Arthrodesis status: Secondary | ICD-10-CM | POA: Insufficient documentation

## 2018-06-16 DIAGNOSIS — Z955 Presence of coronary angioplasty implant and graft: Secondary | ICD-10-CM | POA: Insufficient documentation

## 2018-06-16 DIAGNOSIS — D869 Sarcoidosis, unspecified: Secondary | ICD-10-CM | POA: Insufficient documentation

## 2018-06-16 DIAGNOSIS — Z888 Allergy status to other drugs, medicaments and biological substances status: Secondary | ICD-10-CM | POA: Insufficient documentation

## 2018-06-16 DIAGNOSIS — F329 Major depressive disorder, single episode, unspecified: Secondary | ICD-10-CM | POA: Diagnosis not present

## 2018-06-16 DIAGNOSIS — I10 Essential (primary) hypertension: Secondary | ICD-10-CM | POA: Diagnosis not present

## 2018-06-16 DIAGNOSIS — G8918 Other acute postprocedural pain: Secondary | ICD-10-CM | POA: Diagnosis not present

## 2018-06-16 DIAGNOSIS — R69 Illness, unspecified: Secondary | ICD-10-CM | POA: Diagnosis not present

## 2018-06-16 HISTORY — PX: SHOULDER ARTHROSCOPY WITH SUBACROMIAL DECOMPRESSION: SHX5684

## 2018-06-16 LAB — GLUCOSE, CAPILLARY
Glucose-Capillary: 185 mg/dL — ABNORMAL HIGH (ref 70–99)
Glucose-Capillary: 157 mg/dL — ABNORMAL HIGH (ref 70–99)

## 2018-06-16 SURGERY — SHOULDER ARTHROSCOPY WITH SUBACROMIAL DECOMPRESSION
Anesthesia: General | Site: Shoulder | Laterality: Left

## 2018-06-16 MED ORDER — LIDOCAINE 2% (20 MG/ML) 5 ML SYRINGE
INTRAMUSCULAR | Status: AC
  Filled 2018-06-16: qty 5

## 2018-06-16 MED ORDER — SUCCINYLCHOLINE CHLORIDE 200 MG/10ML IV SOSY
PREFILLED_SYRINGE | INTRAVENOUS | Status: AC
  Filled 2018-06-16: qty 10

## 2018-06-16 MED ORDER — METHOCARBAMOL 500 MG PO TABS: 500.0000 mg | ORAL_TABLET | Freq: Three times a day (TID) | ORAL | 0 refills | Status: DC | PRN

## 2018-06-16 MED ORDER — FENTANYL CITRATE (PF) 100 MCG/2ML IJ SOLN
INTRAMUSCULAR | Status: AC
  Administered 2018-06-16: 50 ug via INTRAVENOUS
  Filled 2018-06-16: qty 2

## 2018-06-16 MED ORDER — POVIDONE-IODINE 7.5 % EX SOLN
Freq: Once | CUTANEOUS | Status: DC
  Filled 2018-06-16: qty 118

## 2018-06-16 MED ORDER — ROCURONIUM BROMIDE 50 MG/5ML IV SOSY
PREFILLED_SYRINGE | INTRAVENOUS | Status: AC
  Filled 2018-06-16: qty 5

## 2018-06-16 MED ORDER — CEFAZOLIN SODIUM-DEXTROSE 2-4 GM/100ML-% IV SOLN
2.0000 g | INTRAVENOUS | Status: AC
  Administered 2018-06-16: 2 g via INTRAVENOUS
  Filled 2018-06-16: qty 100

## 2018-06-16 MED ORDER — SODIUM CHLORIDE 0.9 % IV SOLN
INTRAVENOUS | Status: DC | PRN
  Administered 2018-06-16: 25 ug/min via INTRAVENOUS

## 2018-06-16 MED ORDER — BUPIVACAINE LIPOSOME 1.3 % IJ SUSP
INTRAMUSCULAR | Status: DC | PRN
  Administered 2018-06-16: 10 mL via PERINEURAL

## 2018-06-16 MED ORDER — SODIUM CHLORIDE 0.9 % IR SOLN
Status: DC | PRN
  Administered 2018-06-16: 3000 mL

## 2018-06-16 MED ORDER — FENTANYL CITRATE (PF) 100 MCG/2ML IJ SOLN
INTRAMUSCULAR | Status: DC | PRN
  Administered 2018-06-16: 50 ug via INTRAVENOUS

## 2018-06-16 MED ORDER — ROCURONIUM BROMIDE 100 MG/10ML IV SOLN
INTRAVENOUS | Status: DC | PRN
  Administered 2018-06-16: 50 mg via INTRAVENOUS
  Administered 2018-06-16: 20 mg via INTRAVENOUS

## 2018-06-16 MED ORDER — LIDOCAINE 2% (20 MG/ML) 5 ML SYRINGE
INTRAMUSCULAR | Status: DC | PRN
  Administered 2018-06-16: 60 mg via INTRAVENOUS

## 2018-06-16 MED ORDER — MIDAZOLAM HCL 5 MG/5ML IJ SOLN
INTRAMUSCULAR | Status: DC | PRN
  Administered 2018-06-16: 2 mg via INTRAVENOUS

## 2018-06-16 MED ORDER — MIDAZOLAM HCL 2 MG/2ML IJ SOLN
2.0000 mg | Freq: Once | INTRAMUSCULAR | Status: AC
  Administered 2018-06-16: 2 mg via INTRAVENOUS

## 2018-06-16 MED ORDER — FENTANYL CITRATE (PF) 100 MCG/2ML IJ SOLN
50.0000 ug | Freq: Once | INTRAMUSCULAR | Status: AC
  Administered 2018-06-16: 50 ug via INTRAVENOUS

## 2018-06-16 MED ORDER — PROPOFOL 10 MG/ML IV BOLUS
INTRAVENOUS | Status: AC
  Filled 2018-06-16: qty 20

## 2018-06-16 MED ORDER — PHENYLEPHRINE 40 MCG/ML (10ML) SYRINGE FOR IV PUSH (FOR BLOOD PRESSURE SUPPORT)
PREFILLED_SYRINGE | INTRAVENOUS | Status: AC
  Filled 2018-06-16: qty 10

## 2018-06-16 MED ORDER — MIDAZOLAM HCL 2 MG/2ML IJ SOLN
INTRAMUSCULAR | Status: AC
  Filled 2018-06-16: qty 2

## 2018-06-16 MED ORDER — SUGAMMADEX SODIUM 200 MG/2ML IV SOLN
INTRAVENOUS | Status: DC | PRN
  Administered 2018-06-16: 400 mg via INTRAVENOUS

## 2018-06-16 MED ORDER — PROPOFOL 10 MG/ML IV BOLUS
INTRAVENOUS | Status: DC | PRN
  Administered 2018-06-16: 120 mg via INTRAVENOUS

## 2018-06-16 MED ORDER — MIDAZOLAM HCL 2 MG/2ML IJ SOLN
INTRAMUSCULAR | Status: AC
  Administered 2018-06-16: 2 mg via INTRAVENOUS
  Filled 2018-06-16: qty 2

## 2018-06-16 MED ORDER — FENTANYL CITRATE (PF) 250 MCG/5ML IJ SOLN
INTRAMUSCULAR | Status: AC
  Filled 2018-06-16: qty 5

## 2018-06-16 MED ORDER — LACTATED RINGERS IV SOLN
INTRAVENOUS | Status: DC
  Administered 2018-06-16 (×2): via INTRAVENOUS

## 2018-06-16 MED ORDER — BUPIVACAINE HCL (PF) 0.5 % IJ SOLN
INTRAMUSCULAR | Status: DC | PRN
  Administered 2018-06-16: 15 mL via PERINEURAL

## 2018-06-16 MED ORDER — HYDROMORPHONE HCL 1 MG/ML IJ SOLN: 0.2500 mg | INTRAMUSCULAR | Status: DC | PRN

## 2018-06-16 MED ORDER — OXYCODONE-ACETAMINOPHEN 5-325 MG PO TABS: 1.0000 | ORAL_TABLET | ORAL | 0 refills | Status: DC | PRN

## 2018-06-16 MED ORDER — ONDANSETRON HCL 4 MG/2ML IJ SOLN
INTRAMUSCULAR | Status: DC | PRN
  Administered 2018-06-16: 4 mg via INTRAVENOUS

## 2018-06-16 MED ORDER — ONDANSETRON HCL 4 MG/2ML IJ SOLN
INTRAMUSCULAR | Status: AC
  Filled 2018-06-16: qty 2

## 2018-06-16 SURGICAL SUPPLY — 60 items
BLADE SURG 11 STRL SS (BLADE) ×2 IMPLANT
BUR OVAL 4.0 (BURR) ×2 IMPLANT
CANNULA 5.75X71 LONG (CANNULA) ×1 IMPLANT
CANNULA TWIST IN 8.25X7CM (CANNULA) IMPLANT
CHLORAPREP W/TINT 26ML (MISCELLANEOUS) ×3 IMPLANT
COVER WAND RF STERILE (DRAPES) ×2 IMPLANT
DRAPE INCISE IOBAN 66X45 STRL (DRAPES) ×2 IMPLANT
DRAPE ORTHO SPLIT 77X108 STRL (DRAPES) ×4
DRAPE STERI 35X30 U-POUCH (DRAPES) ×2 IMPLANT
DRAPE SURG 17X23 STRL (DRAPES) ×2 IMPLANT
DRAPE SURG ORHT 6 SPLT 77X108 (DRAPES) ×2 IMPLANT
DRAPE U-SHAPE 47X51 STRL (DRAPES) ×2 IMPLANT
DRAPE U-SHAPE 76X120 STRL (DRAPES) ×2 IMPLANT
DRSG PAD ABDOMINAL 8X10 ST (GAUZE/BANDAGES/DRESSINGS) ×4 IMPLANT
GAUZE SPONGE 4X4 12PLY STRL (GAUZE/BANDAGES/DRESSINGS) ×2 IMPLANT
GAUZE XEROFORM 1X8 LF (GAUZE/BANDAGES/DRESSINGS) ×2 IMPLANT
GLOVE BIO SURGEON STRL SZ7 (GLOVE) ×4 IMPLANT
GLOVE BIO SURGEON STRL SZ7.5 (GLOVE) ×2 IMPLANT
GLOVE BIOGEL PI IND STRL 7.0 (GLOVE) ×2 IMPLANT
GLOVE BIOGEL PI IND STRL 8 (GLOVE) ×1 IMPLANT
GLOVE BIOGEL PI INDICATOR 7.0 (GLOVE) ×2
GLOVE BIOGEL PI INDICATOR 8 (GLOVE) ×1
GOWN STRL REUS W/ TWL LRG LVL3 (GOWN DISPOSABLE) ×3 IMPLANT
GOWN STRL REUS W/ TWL XL LVL3 (GOWN DISPOSABLE) ×1 IMPLANT
GOWN STRL REUS W/TWL LRG LVL3 (GOWN DISPOSABLE) ×6
GOWN STRL REUS W/TWL XL LVL3 (GOWN DISPOSABLE) ×2
KIT BASIN OR (CUSTOM PROCEDURE TRAY) ×2 IMPLANT
KIT TURNOVER KIT B (KITS) ×2 IMPLANT
MANIFOLD NEPTUNE II (INSTRUMENTS) ×2 IMPLANT
NDL HYPO 25GX1X1/2 BEV (NEEDLE) IMPLANT
NDL SCORPION MULTI FIRE (NEEDLE) IMPLANT
NDL SPNL 18GX3.5 QUINCKE PK (NEEDLE) ×1 IMPLANT
NDL SUT 6 .5 CRC .975X.05 MAYO (NEEDLE) IMPLANT
NEEDLE HYPO 25GX1X1/2 BEV (NEEDLE) IMPLANT
NEEDLE MAYO TAPER (NEEDLE)
NEEDLE SCORPION MULTI FIRE (NEEDLE) IMPLANT
NEEDLE SPNL 18GX3.5 QUINCKE PK (NEEDLE) ×2 IMPLANT
PACK SHOULDER (CUSTOM PROCEDURE TRAY) ×2 IMPLANT
PAD ARMBOARD 7.5X6 YLW CONV (MISCELLANEOUS) ×3 IMPLANT
PROBE BIPOLAR ATHRO 135MM 90D (MISCELLANEOUS) ×1 IMPLANT
RESECTOR FULL RADIUS 4.2MM (BLADE) ×1 IMPLANT
SLING ARM FOAM STRAP LRG (SOFTGOODS) ×2 IMPLANT
SLING ARM FOAM STRAP MED (SOFTGOODS) IMPLANT
SPONGE LAP 4X18 RFD (DISPOSABLE) IMPLANT
SUPPORT WRAP ARM LG (MISCELLANEOUS) ×2 IMPLANT
SUT 2 FIBERLOOP 20 STRT BLUE (SUTURE)
SUT ETHILON 3 0 PS 1 (SUTURE) ×2 IMPLANT
SUT TIGER TAPE 7 IN WHITE (SUTURE) IMPLANT
SUTURE 2 FIBERLOOP 20 STRT BLU (SUTURE) IMPLANT
SUTURE TAPE 1.3 40 TPR END (SUTURE) IMPLANT
SUTURETAPE 1.3 40 TPR END (SUTURE)
SYR CONTROL 10ML LL (SYRINGE) ×2 IMPLANT
TAPE FIBER 2MM 7IN #2 BLUE (SUTURE) IMPLANT
TOWEL OR 17X24 6PK STRL BLUE (TOWEL DISPOSABLE) ×2 IMPLANT
TOWEL OR 17X26 10 PK STRL BLUE (TOWEL DISPOSABLE) ×2 IMPLANT
TOWEL OR NON WOVEN STRL DISP B (DISPOSABLE) ×2 IMPLANT
TUBE CONNECTING 12X1/4 (SUCTIONS) ×1 IMPLANT
TUBING ARTHROSCOPY IRRIG 16FT (MISCELLANEOUS) ×1 IMPLANT
WAND HAND CNTRL MULTIVAC 90 (MISCELLANEOUS) ×1 IMPLANT
WATER STERILE IRR 1000ML POUR (IV SOLUTION) ×2 IMPLANT

## 2018-06-16 NOTE — Discharge Instructions (Signed)

## 2018-06-16 NOTE — Anesthesia Procedure Notes (Signed)
Procedure Name: Intubation Date/Time: 06/16/2018 10:00 AM Performed by: Lavell Luster, CRNA Pre-anesthesia Checklist: Patient identified, Emergency Drugs available, Suction available, Patient being monitored and Timeout performed Patient Re-evaluated:Patient Re-evaluated prior to induction Oxygen Delivery Method: Circle system utilized Preoxygenation: Pre-oxygenation with 100% oxygen Induction Type: IV induction Ventilation: Mask ventilation without difficulty Laryngoscope Size: Mac, 4 and Glidescope Grade View: Grade I Tube type: Oral Tube size: 7.5 mm Number of attempts: 1 Airway Equipment and Method: Stylet and Video-laryngoscopy Placement Confirmation: ETT inserted through vocal cords under direct vision,  positive ETCO2 and breath sounds checked- equal and bilateral Secured at: 22 cm Tube secured with: Tape Dental Injury: Teeth and Oropharynx as per pre-operative assessment  Difficulty Due To: Difficulty was anticipated Comments: Pt has h/o cervical fusion, DL with videolaryngoscope, good view of cords and ETT passed easily.  Dr Ola Spurr verified placement.  Henderson Cloud, CRNA

## 2018-06-16 NOTE — Transfer of Care (Signed)
Immediate Anesthesia Transfer of Care Note  Patient: Tony Manning  Procedure(s) Performed: LEFT SHOULDER ARTHROSCOPY WITH ROTATOR CUFF DEBRIDEMENT AND  SUBACROMIAL DECOMPRESSION (Left Shoulder)  Patient Location: PACU  Anesthesia Type:General  Level of Consciousness: awake, alert  and sedated  Airway & Oxygen Therapy: Patient connected to face mask oxygen  Post-op Assessment: Post -op Vital signs reviewed and stable  Post vital signs: stable  Last Vitals:  Vitals Value Taken Time  BP    Temp    Pulse 67 06/16/2018 11:09 AM  Resp 16 06/16/2018 11:09 AM  SpO2 99 % 06/16/2018 11:09 AM  Vitals shown include unvalidated device data.  Last Pain:  Vitals:   06/16/18 0918  TempSrc:   PainSc: 0-No pain      Patients Stated Pain Goal: 0 (88/91/69 4503)  Complications: No apparent anesthesia complications

## 2018-06-16 NOTE — Anesthesia Procedure Notes (Signed)
Anesthesia Regional Block: Interscalene brachial plexus block   Pre-Anesthetic Checklist: ,, timeout performed, Correct Patient, Correct Site, Correct Laterality, Correct Procedure, Correct Position, site marked, Risks and benefits discussed, pre-op evaluation,  At surgeon's request and post-op pain management  Laterality: Left  Prep: Maximum Sterile Barrier Precautions used, chloraprep       Needles:  Injection technique: Single-shot  Needle Type: Echogenic Stimulator Needle     Needle Length: 5cm  Needle Gauge: 22     Additional Needles:   Procedures:,,,, ultrasound used (permanent image in chart),,,,  Narrative:  Start time: 06/16/2018 9:02 AM End time: 06/16/2018 9:12 AM Injection made incrementally with aspirations every 5 mL. Anesthesiologist: Roderic Palau, MD  Additional Notes: 2% Lidocaine skin wheel.

## 2018-06-16 NOTE — Anesthesia Preprocedure Evaluation (Addendum)
Anesthesia Evaluation  Patient identified by MRN, date of birth, ID band Patient awake    Reviewed: Allergy & Precautions, H&P , NPO status , Patient's Chart, lab work & pertinent test results, reviewed documented beta blocker date and time   Airway Mallampati: III  TM Distance: >3 FB Neck ROM: Full    Dental no notable dental hx. (+) Teeth Intact, Dental Advisory Given   Pulmonary sleep apnea and Continuous Positive Airway Pressure Ventilation ,    Pulmonary exam normal breath sounds clear to auscultation       Cardiovascular hypertension, Pt. on medications and Pt. on home beta blockers + CAD, + Past MI and + Cardiac Stents   Rhythm:Regular Rate:Normal     Neuro/Psych  Headaches, Depression    GI/Hepatic Neg liver ROS, GERD  Medicated and Controlled,  Endo/Other  diabetes, Insulin Dependent, Oral Hypoglycemic Agents  Renal/GU negative Renal ROS  negative genitourinary   Musculoskeletal   Abdominal   Peds  Hematology negative hematology ROS (+)   Anesthesia Other Findings   Reproductive/Obstetrics negative OB ROS                            Anesthesia Physical Anesthesia Plan  ASA: III  Anesthesia Plan: General   Post-op Pain Management:  Regional for Post-op pain   Induction: Intravenous  PONV Risk Score and Plan: 3 and Ondansetron, Midazolam and Treatment may vary due to age or medical condition  Airway Management Planned: Oral ETT  Additional Equipment:   Intra-op Plan:   Post-operative Plan: Extubation in OR  Informed Consent: I have reviewed the patients History and Physical, chart, labs and discussed the procedure including the risks, benefits and alternatives for the proposed anesthesia with the patient or authorized representative who has indicated his/her understanding and acceptance.   Dental advisory given  Plan Discussed with: CRNA  Anesthesia Plan Comments:          Anesthesia Quick Evaluation

## 2018-06-16 NOTE — H&P (Signed)
Tony Manning is an 57 y.o. male.   Chief Complaint: L shoulder pain and dysfunction HPI: Left shoulder pain and dysfunction with chronic impingement and unfavorable acromial anatomy, failed conservative management.  Past Medical History:  Diagnosis Date  . Anginal pain ([place])   . Basal cell carcinoma of face   . Chest pain, unspecified   . Coronary atherosclerosis of native coronary artery    s/p stent x4  . Depression   . Diabetes mellitus without mention of complication   . Diverticulosis   . Dyspnea   . Dysrhythmia   . Erectile dysfunction   . Essential hypertension, benign   . GERD (gastroesophageal reflux disease)   . Heart murmur   . History of kidney stones   . History of nephrolithiasis   . Migraine   . Myocardial infarction ([place])   . Neuromuscular disorder ([place])   . Osteoarthritis   . Pneumonia   . Postsurgical percutaneous transluminal coronary angioplasty status    s/p stent x4  . Sarcoidosis   . Unspecified sleep apnea     Past Surgical History:  Procedure Laterality Date  . ANTERIOR CERVICAL DECOMP/DISCECTOMY FUSION N/A 04/07/2017   Procedure: ANTERIOR CERVICAL DECOMPRESSION FUSION, CERVICAL 4-5 WITH INSTRUMENTATION AND ALLOGRAFT;  Surgeon: Phylliss Bob, MD;  Location: [facility];  Service: Orthopedics;  Laterality: N/A;  ANTERIOR CERVICAL DECOMPRESSION FUSION, CERVICAL 4-5 WITH INSTRUMENTATION AND ALLOGRAFT; REQUEST 2.5 HOURS AND FLIP ROOM  . APPENDECTOMY  2005  . CARPAL TUNNEL RELEASE    . CERVICAL FUSION  2009  . CORONARY ANGIOPLASTY WITH STENT PLACEMENT    . CORONARY STENT INTERVENTION N/A 10/16/2016   Procedure: Coronary Stent Intervention;  Surgeon: Adrian Prows, MD;  Location: [facility];  Service: Cardiovascular;  Laterality: N/A;  . ELBOW SURGERY     bilateral  . KNEE SURGERY     Bilateral   . LEFT HEART CATH AND CORONARY ANGIOGRAPHY N/A 10/16/2016   Procedure: Left Heart Cath and Coronary Angiography;  Surgeon: Adrian Prows, MD;  Location: [facility];  Service: Cardiovascular;  Laterality: N/A;  . LEFT HEART CATHETERIZATION WITH CORONARY ANGIOGRAM N/A 06/14/2014   Procedure: LEFT HEART CATHETERIZATION WITH CORONARY ANGIOGRAM;  Surgeon: Laverda Page, MD;  Location: [facility];  Service: Cardiovascular;  Laterality: N/A;  . LUNG BIOPSY     sarcoid  . ROTATOR CUFF REPAIR     left    Family History  Problem Relation Age of Onset  . Arthritis Mother   . Depression Mother   . Stroke Mother   . Arthritis Father   . Diabetes Father   . Coronary artery disease Father   . Colon cancer Neg Hx   . Prostate cancer Neg Hx   . Esophageal cancer Neg Hx   . Rectal cancer Neg Hx   . Stomach cancer Neg Hx    Social History:  reports that he has never smoked. He quit smokeless tobacco use about 2 years ago.  His smokeless tobacco use included chew. He reports that he does not drink alcohol or use drugs.  Allergies:  Allergies  Allergen Reactions  . Ambien [Zolpidem] Other (See Comments)    Parasomnias, sleep walking  . Lisinopril Cough  . Tramadol Diarrhea    Medications Prior to Admission  Medication Sig Dispense Refill  . aspirin 81 MG chewable tablet Chew 81 mg by mouth at bedtime.    [name] Kitchen atorvastatin (LIPITOR) 40 MG tablet TAKE 1 TABLET (40 MG TOTAL) BY  MOUTH DAILY. 90 tablet 1  . carvedilol (COREG) 6.25 MG tablet Take 6.25 mg by mouth 2 (two) times daily with a meal.    . diclofenac sodium (VOLTAREN) 1 % GEL Apply 2-4 g topically 4 (four) times daily. (Patient taking differently: Apply 2-4 g topically daily as needed (joint pain). ) 100 g 3  . glimepiride (AMARYL) 4 MG tablet TAKE 1 TABLET BY MOUTH TWICE A DAY (Patient taking differently: Take 4 mg by mouth 2 (two) times daily. ) 180 tablet 1  . Insulin Detemir (LEVEMIR FLEXTOUCH) 100 UNIT/ML Pen INJECT 40 UNITS SUBQ EVERY MORN. BS > 120: ADD 1 UNIT, BS <80: SUBTRACT 1 UNIT. BS 80-120: NO CHANGE 45 pen 0  . losartan (COZAAR) 100 MG tablet Take 0.5 tablets (50 mg  total) by mouth 2 (two) times daily. (Patient taking differently: Take 100 mg by mouth daily. )    . metFORMIN (GLUCOPHAGE) 1000 MG tablet TAKE 1 TABLET BY MOUTH TWICE A DAY WITH A MEAL (Patient taking differently: Take 1,000 mg by mouth daily with breakfast. TAKE 1 TABLET BY MOUTH TWICE A DAY WITH A MEAL) 180 tablet 0  . nitroGLYCERIN (NITROSTAT) 0.4 MG SL tablet Place 1 tablet (0.4 mg total) under the tongue every 5 (five) minutes as needed for chest pain. 25 tablet 3  . oxyCODONE (ROXICODONE) 5 MG immediate release tablet Take 1 tablet (5 mg total) by mouth every 6 (six) hours as needed for severe pain (for kidney stone). 20 tablet 0  . ranitidine (ZANTAC) 150 MG tablet Take 150 mg by mouth daily.     . tamsulosin (FLOMAX) 0.4 MG CAPS capsule TAKE 1 CAPSULE (0.4 MG TOTAL) BY MOUTH DAILY. 30 capsule 5  . Vitamin D, Ergocalciferol, (DRISDOL) 50000 units CAPS capsule Take 1 capsule (50,000 Units total) by mouth every 7 (seven) days. (Patient taking differently: Take 50,000 Units by mouth every Monday. ) 12 capsule 0  . clopidogrel (PLAVIX) 75 MG tablet Take 75 mg by mouth daily.     [name] Kitchen glucose blood (ONE TOUCH ULTRA TEST) test strip Check sugar up to 6 times a day.  DM2, insulin treated, uncontrolled. 200 each 12  . Insulin Pen Needle 32G X 4 MM MISC Use daily with insulin pen. 100 each 3    Results for orders placed or performed during the hospital encounter of 06/16/18 (from the past 48 hour(s))  Glucose, capillary     Status: Abnormal   Collection Time: 06/16/18  8:33 AM  Result Value Ref Range   Glucose-Capillary 185 (H) 70 - 99 mg/dL   No results found.  Review of Systems  All other systems reviewed and are negative.   Blood pressure (!) 153/76, pulse 61, temperature 98.3 F (36.8 C), temperature source Oral, resp. rate 17, height 5\' 10"  (1.778 m), weight 94.3 kg, SpO2 99 %. Physical Exam  Constitutional: He is oriented to person, place, and time. He appears well-developed and  well-nourished.  HENT:  Head: Atraumatic.  Eyes: EOM are normal.  Cardiovascular: Intact distal pulses.  Respiratory: Effort normal.  Musculoskeletal:  L shoulder pain with impingement testing. NVID.  Neurological: He is alert and oriented to person, place, and time.  Skin: Skin is warm and dry.  Psychiatric: He has a normal mood and affect.     Assessment/Plan Left shoulder pain and dysfunction with chronic impingement and unfavorable acromial anatomy, failed conservative management. Plan left shoulder arthroscopic debridement partial rotator cuff tear, subacromial decompression. Risks / benefits of surgery discussed  Consent on chart  NPO for OR Preop antibiotics   Isabella Stalling, MD 06/16/2018, 9:19 AM

## 2018-06-16 NOTE — Op Note (Signed)
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 06/16/2018  Preoperative diagnosis: Left shoulder partial-thickness rotator cuff tear, chronic impingement with unfavorable acromial anatomy  Postoperative diagnosis: Same  Procedure(s) and Anesthesia Type:    #1 left shoulder arthroscopic extensive debridement partial rotator cuff tear, subacromial bursitis #2 left shoulder arthroscopic subacromial decompression  Surgeon(s) and Role:    Tania Ade, MD - Primary     Surgeon: Isabella Stalling   Assistants: Jeanmarie Hubert PA-C (Danielle was present and scrubbed throughout the procedure and was essential in positioning, assisting with the camera and instrumentation,, and closure)  Anesthesia: General endotracheal anesthesia with preoperative interscalene block given by attending anesthesiologist    Procedure Detail   Estimated Blood Loss: Min         Drains: none  Blood Given: none         Specimens: none        Complications:  * No complications entered in OR log *         Disposition: PACU - hemodynamically stable.         Condition: stable    Procedure:   INDICATIONS FOR SURGERY: The patient is 57 y.o. male who has had a long history of left shoulder pain and dysfunction.  He has had some partial tearing of the rotator cuff noted on MRI but no full tears.  He has chronic impingement with type III unfavorable acromial anatomy.  Indicated for surgical management to decrease pain and restore function.  OPERATIVE FINDINGS: Examination under anesthesia: No stiffness or instability   DESCRIPTION OF PROCEDURE: The patient was identified in preoperative  holding area where I personally marked the operative site after  verifying site, side, and procedure with the patient. An interscalene block was given by the attending anesthesiologist the holding area.  The  patient was taken back to the operating room where general anesthesia was induced without complication and was placed in the beach-chair position with the back  elevated about 60 degrees and all extremities and head and neck carefully padded and  positioned.   The left upper extremity was then prepped and  draped in a standard sterile fashion. The appropriate time-out  procedure was carried out. The patient did receive IV antibiotics  within 30 minutes of incision.   A small posterior portal incision was made and the arthroscope was introduced into the joint. An anterior portal was then established above the subscapularis using needle localization. Small cannula was placed anteriorly. Diagnostic arthroscopy was then carried out .  The subscapularis was completely intact.  The superior labrum had some mild fraying which was debrided but grossly intact.  The biceps tendon looked healthy.  Glenohumeral joint surfaces were intact.  There is an area on the posterior superior humeral head with some mild cystic changes consistent with chronic impingement.  The undersurface of the supraspinatus and infraspinatus were intact with only mild fraying.  The arthroscope was then introduced into the subacromial space a standard lateral portal was established with needle localization. The shaver was used through the lateral portal to perform extensive bursectomy. Coracoacromial ligament was examined and found to be frayed indicating chronic impingement.  The bursal surface of the rotator cuff was examined and noted to have some extensive fraying with some areas of deep fissures.  This was extensively debrided down to healthy tendon with a shaver.  There were no areas of exposed tuberosity.  The  coracoacromial ligament was taken down off the anterior acromion with the ArthroCare exposing a very large downsloping anterior acromial spur which significantly narrows the subacromial space anteriorly.. A high-speed bur  was then used through the lateral portal to take down the anterior acromial spur from lateral to medial in a standard acromioplasty.  The acromioplasty was also viewed from the lateral portal and the bur was used as necessary to ensure that the acromion was completely flat from posterior to anterior.  The arthroscopic equipment was removed from the joint and the portals were closed with 3-0 nylon in an interrupted fashion. Sterile dressings were then applied including Xeroform 4 x 4's ABDs and tape. The patient was then allowed to awaken from general anesthesia, placed in a sling, transferred to the stretcher and taken to the recovery room in stable condition.   POSTOPERATIVE PLAN: The patient will be discharged home today and will followup in one week for suture removal and wound check.

## 2018-06-16 NOTE — Anesthesia Postprocedure Evaluation (Signed)
Anesthesia Post Note  Patient: Tony Manning  Procedure(s) Performed: LEFT SHOULDER ARTHROSCOPY WITH ROTATOR CUFF DEBRIDEMENT AND  SUBACROMIAL DECOMPRESSION (Left Shoulder)     Patient location during evaluation: PACU Anesthesia Type: General and Regional Level of consciousness: awake and alert Pain management: pain level controlled Vital Signs Assessment: post-procedure vital signs reviewed and stable Respiratory status: spontaneous breathing, nonlabored ventilation and respiratory function stable Cardiovascular status: blood pressure returned to baseline and stable Postop Assessment: no apparent nausea or vomiting Anesthetic complications: no    Last Vitals:  Vitals:   06/16/18 1125 06/16/18 1130  BP: 103/72   Pulse: 62 61  Resp: 15 14  Temp:    SpO2: 95% 93%    Last Pain:  Vitals:   06/16/18 1130  TempSrc:   PainSc: 0-No pain                 Tye Juarez,W. EDMOND

## 2018-06-17 ENCOUNTER — Encounter (HOSPITAL_COMMUNITY): Payer: Self-pay | Admitting: Orthopedic Surgery

## 2018-06-21 DIAGNOSIS — G4733 Obstructive sleep apnea (adult) (pediatric): Secondary | ICD-10-CM | POA: Diagnosis not present

## 2018-06-27 DIAGNOSIS — Z9889 Other specified postprocedural states: Secondary | ICD-10-CM | POA: Diagnosis not present

## 2018-07-13 ENCOUNTER — Encounter: Payer: Self-pay | Admitting: Internal Medicine

## 2018-07-13 ENCOUNTER — Ambulatory Visit (INDEPENDENT_AMBULATORY_CARE_PROVIDER_SITE_OTHER): Payer: Medicare HMO | Admitting: Internal Medicine

## 2018-07-13 VITALS — BP 122/70 | HR 66 | Temp 98.1°F | Wt 215.0 lb

## 2018-07-13 DIAGNOSIS — E1169 Type 2 diabetes mellitus with other specified complication: Secondary | ICD-10-CM

## 2018-07-13 DIAGNOSIS — Z794 Long term (current) use of insulin: Secondary | ICD-10-CM | POA: Diagnosis not present

## 2018-07-13 DIAGNOSIS — L02419 Cutaneous abscess of limb, unspecified: Secondary | ICD-10-CM

## 2018-07-13 DIAGNOSIS — L02211 Cutaneous abscess of abdominal wall: Secondary | ICD-10-CM

## 2018-07-13 MED ORDER — DOXYCYCLINE HYCLATE 100 MG PO TABS: 100.0000 mg | ORAL_TABLET | Freq: Two times a day (BID) | ORAL | 0 refills | Status: DC

## 2018-07-13 NOTE — Patient Instructions (Signed)
Skin Abscess A skin abscess is an infected area on or under your skin that contains pus and other material. An abscess can happen almost anywhere on your body. Some abscesses break open (rupture) on their own. Most continue to get worse unless they are treated. The infection can spread deeper into the body and into your blood, which can make you feel sick. Treatment usually involves draining the abscess. Follow these instructions at home: Abscess Care  If you have an abscess that has not drained, place a warm, clean, wet washcloth over the abscess several times a day. Do this as told by your doctor.  Follow instructions from your doctor about how to take care of your abscess. Make sure you: ? Cover the abscess with a bandage (dressing). ? Change your bandage or gauze as told by your doctor. ? Wash your hands with soap and water before you change the bandage or gauze. If you cannot use soap and water, use hand sanitizer.  Check your abscess every day for signs that the infection is getting worse. Check for: ? More redness, swelling, or pain. ? More fluid or blood. ? Warmth. ? More pus or a bad smell. Medicines   Take over-the-counter and prescription medicines only as told by your doctor.  If you were prescribed an antibiotic medicine, take it as told by your doctor. Do not stop taking the antibiotic even if you start to feel better. General instructions  To avoid spreading the infection: ? Do not share personal care items, towels, or hot tubs with others. ? Avoid making skin-to-skin contact with other people.  Keep all follow-up visits as told by your doctor. This is important. Contact a doctor if:  You have more redness, swelling, or pain around your abscess.  You have more fluid or blood coming from your abscess.  Your abscess feels warm when you touch it.  You have more pus or a bad smell coming from your abscess.  You have a fever.  Your muscles ache.  You have  chills.  You feel sick. Get help right away if:  You have very bad (severe) pain.  You see red streaks on your skin spreading away from the abscess. This information is not intended to replace advice given to you by your health care provider. Make sure you discuss any questions you have with your health care provider. Document Released: 02/10/2008 Document Revised: 04/19/2016 Document Reviewed: 07/03/2015 Elsevier Interactive Patient Education  2018 Elsevier Inc.  

## 2018-07-13 NOTE — Progress Notes (Signed)
Subjective:    Patient ID: Tony Manning, male    DOB: 12/11/1960, 57 y.o.   MRN: [id number]  HPI  Pt presents to the clinic today with concern of recurrent boils. He reports he has had 2 boils in her right armpit within the last 2 weeks. He has one now that has drained blood tinged pus. He now reports that he has one coming up on his abdomen. He reports they are painful. He denies fever, chills or body aches. He has tried warm compresses with some relief. He does have a history of DM 2.  Review of Systems      Past Medical History:  Diagnosis Date  . Anginal pain ([place])   . Basal cell carcinoma of face   . Chest pain, unspecified   . Coronary atherosclerosis of native coronary artery    s/p stent x4  . Depression   . Diabetes mellitus without mention of complication   . Diverticulosis   . Dyspnea   . Dysrhythmia   . Erectile dysfunction   . Essential hypertension, benign   . GERD (gastroesophageal reflux disease)   . Heart murmur   . History of kidney stones   . History of nephrolithiasis   . Migraine   . Myocardial infarction ([place])   . Neuromuscular disorder ([place])   . Osteoarthritis   . Pneumonia   . Postsurgical percutaneous transluminal coronary angioplasty status    s/p stent x4  . Sarcoidosis   . Unspecified sleep apnea     Current Outpatient Medications  Medication Sig Dispense Refill  . aspirin 81 MG chewable tablet Chew 81 mg by mouth at bedtime.    [name] Kitchen atorvastatin (LIPITOR) 40 MG tablet TAKE 1 TABLET (40 MG TOTAL) BY MOUTH DAILY. 90 tablet 1  . carvedilol (COREG) 6.25 MG tablet Take 6.25 mg by mouth 2 (two) times daily with a meal.    . clopidogrel (PLAVIX) 75 MG tablet Take 75 mg by mouth daily.     . diclofenac sodium (VOLTAREN) 1 % GEL Apply 2-4 g topically 4 (four) times daily. (Patient taking differently: Apply 2-4 g topically daily as needed (joint pain). ) 100 g 3  . glimepiride (AMARYL) 4 MG tablet TAKE 1 TABLET BY MOUTH TWICE A DAY (Patient  taking differently: Take 4 mg by mouth 2 (two) times daily. ) 180 tablet 1  . glucose blood (ONE TOUCH ULTRA TEST) test strip Check sugar up to 6 times a day.  DM2, insulin treated, uncontrolled. 200 each 12  . Insulin Detemir (LEVEMIR FLEXTOUCH) 100 UNIT/ML Pen INJECT 40 UNITS SUBQ EVERY MORN. BS > 120: ADD 1 UNIT, BS <80: SUBTRACT 1 UNIT. BS 80-120: NO CHANGE 45 pen 0  . Insulin Pen Needle 32G X 4 MM MISC Use daily with insulin pen. 100 each 3  . losartan (COZAAR) 100 MG tablet Take 0.5 tablets (50 mg total) by mouth 2 (two) times daily. (Patient taking differently: Take 100 mg by mouth daily. )    . metFORMIN (GLUCOPHAGE) 1000 MG tablet TAKE 1 TABLET BY MOUTH TWICE A DAY WITH A MEAL (Patient taking differently: Take 1,000 mg by mouth daily with breakfast. TAKE 1 TABLET BY MOUTH TWICE A DAY WITH A MEAL) 180 tablet 0  . methocarbamol (ROBAXIN) 500 MG tablet Take 1 tablet (500 mg total) by mouth every 8 (eight) hours as needed for muscle spasms. 30 tablet 0  . nitroGLYCERIN (NITROSTAT) 0.4 MG SL tablet Place 1 tablet (0.4 mg total) under  the tongue every 5 (five) minutes as needed for chest pain. 25 tablet 3  . oxyCODONE-acetaminophen (PERCOCET) 5-325 MG tablet Take 1-2 tablets by mouth every 4 (four) hours as needed for severe pain. 30 tablet 0  . ranitidine (ZANTAC) 150 MG tablet Take 150 mg by mouth daily.     . tamsulosin (FLOMAX) 0.4 MG CAPS capsule TAKE 1 CAPSULE (0.4 MG TOTAL) BY MOUTH DAILY. 30 capsule 5  . Vitamin D, Ergocalciferol, (DRISDOL) 50000 units CAPS capsule Take 1 capsule (50,000 Units total) by mouth every 7 (seven) days. (Patient taking differently: Take 50,000 Units by mouth every Monday. ) 12 capsule 0   No current facility-administered medications for this visit.     Allergies  Allergen Reactions  . Ambien [Zolpidem] Other (See Comments)    Parasomnias, sleep walking  . Lisinopril Cough  . Tramadol Diarrhea    Family History  Problem Relation Age of Onset  .  Arthritis Mother   . Depression Mother   . Stroke Mother   . Arthritis Father   . Diabetes Father   . Coronary artery disease Father   . Colon cancer Neg Hx   . Prostate cancer Neg Hx   . Esophageal cancer Neg Hx   . Rectal cancer Neg Hx   . Stomach cancer Neg Hx     Social History   Socioeconomic History  . Marital status: Married    Spouse name: Not on file  . Number of children: 2  . Years of education: Not on file  . Highest education level: Not on file  Occupational History  . Occupation: disabled    Fish farm manager: unemployed  Social Needs  . Financial resource strain: Not on file  . Food insecurity:    Worry: Not on file    Inability: Not on file  . Transportation needs:    Medical: Not on file    Non-medical: Not on file  Tobacco Use  . Smoking status: Never Smoker  . Smokeless tobacco: Former Systems developer    Types: Chew  . Tobacco comment: trying to quit  Substance and Sexual Activity  . Alcohol use: No    Alcohol/week: 0.0 standard drinks  . Drug use: No  . Sexual activity: Yes    Birth control/protection: Condom  Lifestyle  . Physical activity:    Days per week: Not on file    Minutes per session: Not on file  . Stress: Not on file  Relationships  . Social connections:    Talks on phone: Not on file    Gets together: Not on file    Attends religious service: Not on file    Active member of club or organization: Not on file    Attends meetings of clubs or organizations: Not on file    Relationship status: Not on file  . Intimate partner violence:    Fear of current or ex partner: Not on file    Emotionally abused: Not on file    Physically abused: Not on file    Forced sexual activity: Not on file  Other Topics Concern  . Not on file  Social History Narrative   Married, 1987   2 kids   On disability     Constitutional: Denies fever, malaise, fatigue, headache or abrupt weight changes.  Skin: Pt reports boil to right armpit, abdomen.    No other  specific complaints in a complete review of systems (except as listed in HPI above).  Objective:   Physical Exam  BP 122/70   Pulse 66   Temp 98.1 F (36.7 C) (Oral)   Wt 215 lb (97.5 kg)   SpO2 98%   BMI 30.85 kg/m  Wt Readings from Last 3 Encounters:  07/13/18 215 lb (97.5 kg)  06/16/18 208 lb (94.3 kg)  06/07/18 210 lb 11.2 oz (95.6 kg)    General: Appears his stated age, obese, in NAD. Skin: 1.5 cm fluctuant abscess noted in right axilla. 1 cm hardened abscess with surrounding cellulitis noted of abdomen. Abdomen: Soft and nontender. Normal bowel sounds. No distention noted.  Neurological: Alert and oriented.   BMET    Component Value Date/Time   NA 138 06/07/2018 0909   NA 138 09/21/2017 1156   K 4.0 06/07/2018 0909   CL 106 06/07/2018 0909   CO2 24 06/07/2018 0909   GLUCOSE 235 (H) 06/07/2018 0909   BUN 17 06/07/2018 0909   BUN 17 09/21/2017 1156   CREATININE 1.26 (H) 06/07/2018 0909   CALCIUM 9.2 06/07/2018 0909   GFRNONAA >60 06/07/2018 0909   GFRAA >60 06/07/2018 0909    Lipid Panel     Component Value Date/Time   CHOL 129 12/07/2017 1043   TRIG 172.0 (H) 12/07/2017 1043   HDL 38.60 (L) 12/07/2017 1043   CHOLHDL 3 12/07/2017 1043   VLDL 34.4 12/07/2017 1043   LDLCALC 56 12/07/2017 1043    CBC    Component Value Date/Time   WBC 8.0 06/07/2018 0909   RBC 4.79 06/07/2018 0909   HGB 14.1 06/07/2018 0909   HCT 43.2 06/07/2018 0909   PLT 210 06/07/2018 0909   MCV 90.2 06/07/2018 0909   MCH 29.4 06/07/2018 0909   MCHC 32.6 06/07/2018 0909   RDW 13.6 06/07/2018 0909   LYMPHSABS 1.7 05/05/2018 1221   MONOABS 0.6 05/05/2018 1221   EOSABS 0.3 05/05/2018 1221   BASOSABS 0.1 05/05/2018 1221    Hgb A1C Lab Results  Component Value Date   HGBA1C 8.6 (H) 06/07/2018            Assessment & Plan:   Abscess, Right Axilla:  I&D- see procedure note Aftercare instructions provided eRx for Doxycycline 100 mg BID x 10 days Continue warm  compresses prn  Abscess, Abdomen:  Unable to dray Continue warm compresses prn eRx for Doxycycline 100 mg BID x 10 days  Procedure Note:  Discussed risks of infection, bleeding and pain Informed consent obtained Area cleansed with Betadine x 3 Area numbed with 2% Lidocaine 1.5 mL Incised using #11 blade Expressed small amount of hard pus, small amount of blood Area packed with gauze- advised him to remove this in 24 hours Area covered with TAB, 2x2 and Band Aid  Return precautions discussed Webb Silversmith, NP

## 2018-07-19 ENCOUNTER — Telehealth: Payer: Self-pay

## 2018-07-19 NOTE — Telephone Encounter (Signed)
Diane with med compliance with [place] [employer] has tried 27 out reachs in past 1 -2 years and unable to reach pt. [employer] cannot reach pt presently; Diane said Atorvastatin 40 mg and Glimepiride 4 mg were last filled on 04/04/18 and Losartan 100 mg was last filled on 04/12/18. Diane request that I try to reach pt because pt is behind on picking up meds. Unable to reach pt. FYI to Dr [name] at [facility] request and [place] CMA. [facility] [employer]

## 2018-07-20 NOTE — Telephone Encounter (Signed)
Rena-I thank you for your effort. I think this note demonstrates ample effort in terms of patient outreach.  I will defer to the patient otherwise.  I do not think it is reasonable to spend additional time here at clinic fielding phone calls from insurance companies.

## 2018-07-22 DIAGNOSIS — G4733 Obstructive sleep apnea (adult) (pediatric): Secondary | ICD-10-CM | POA: Diagnosis not present

## 2018-07-22 IMAGING — CR DG CHEST 2V
2 series · 2 of 2 positions shown · non-contrast
Comparison: Chest x-ray 09/29/2010.

CLINICAL DATA: Spine surgery.  Preoperative exam .

EXAM:
CHEST  2 VIEW

[w chest pa]
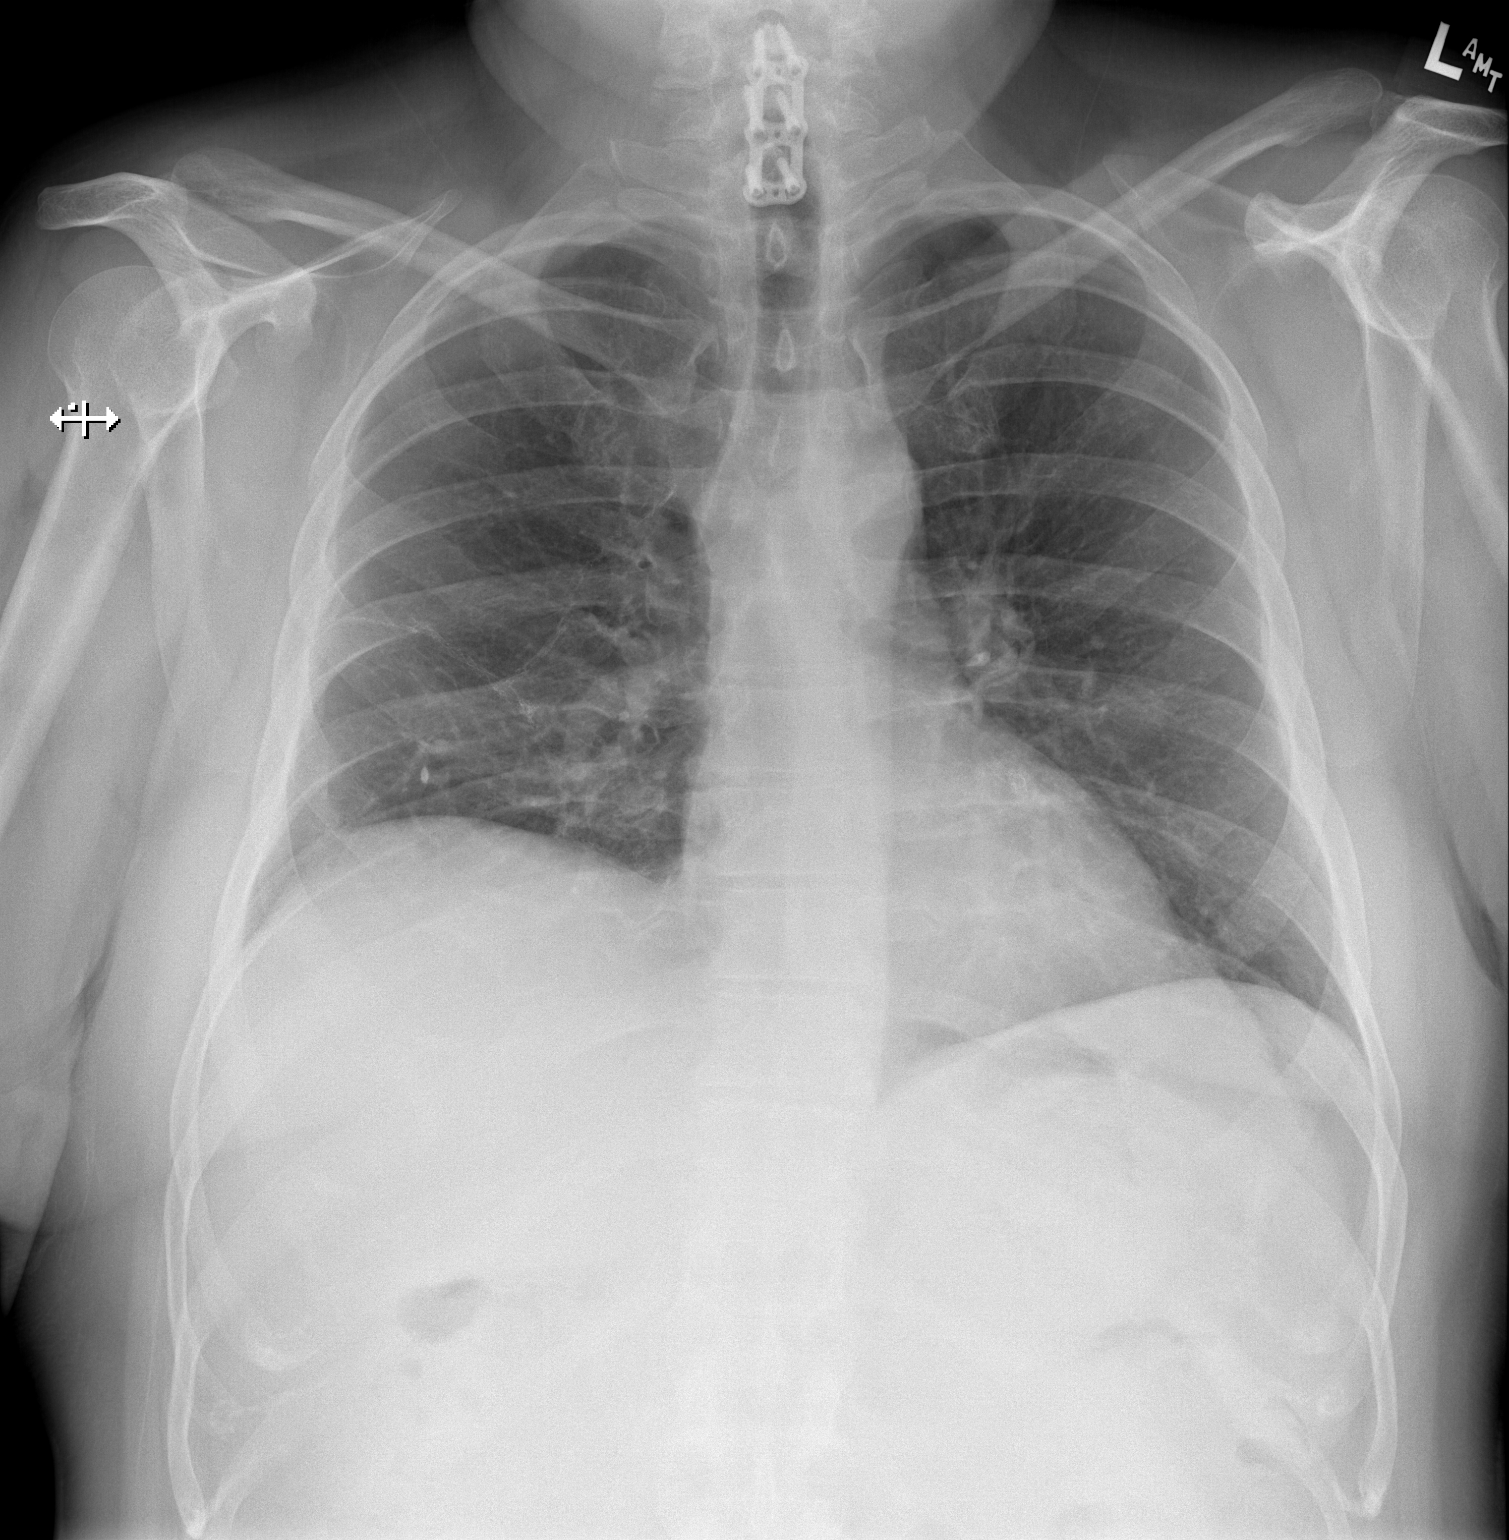

[w chest lat]
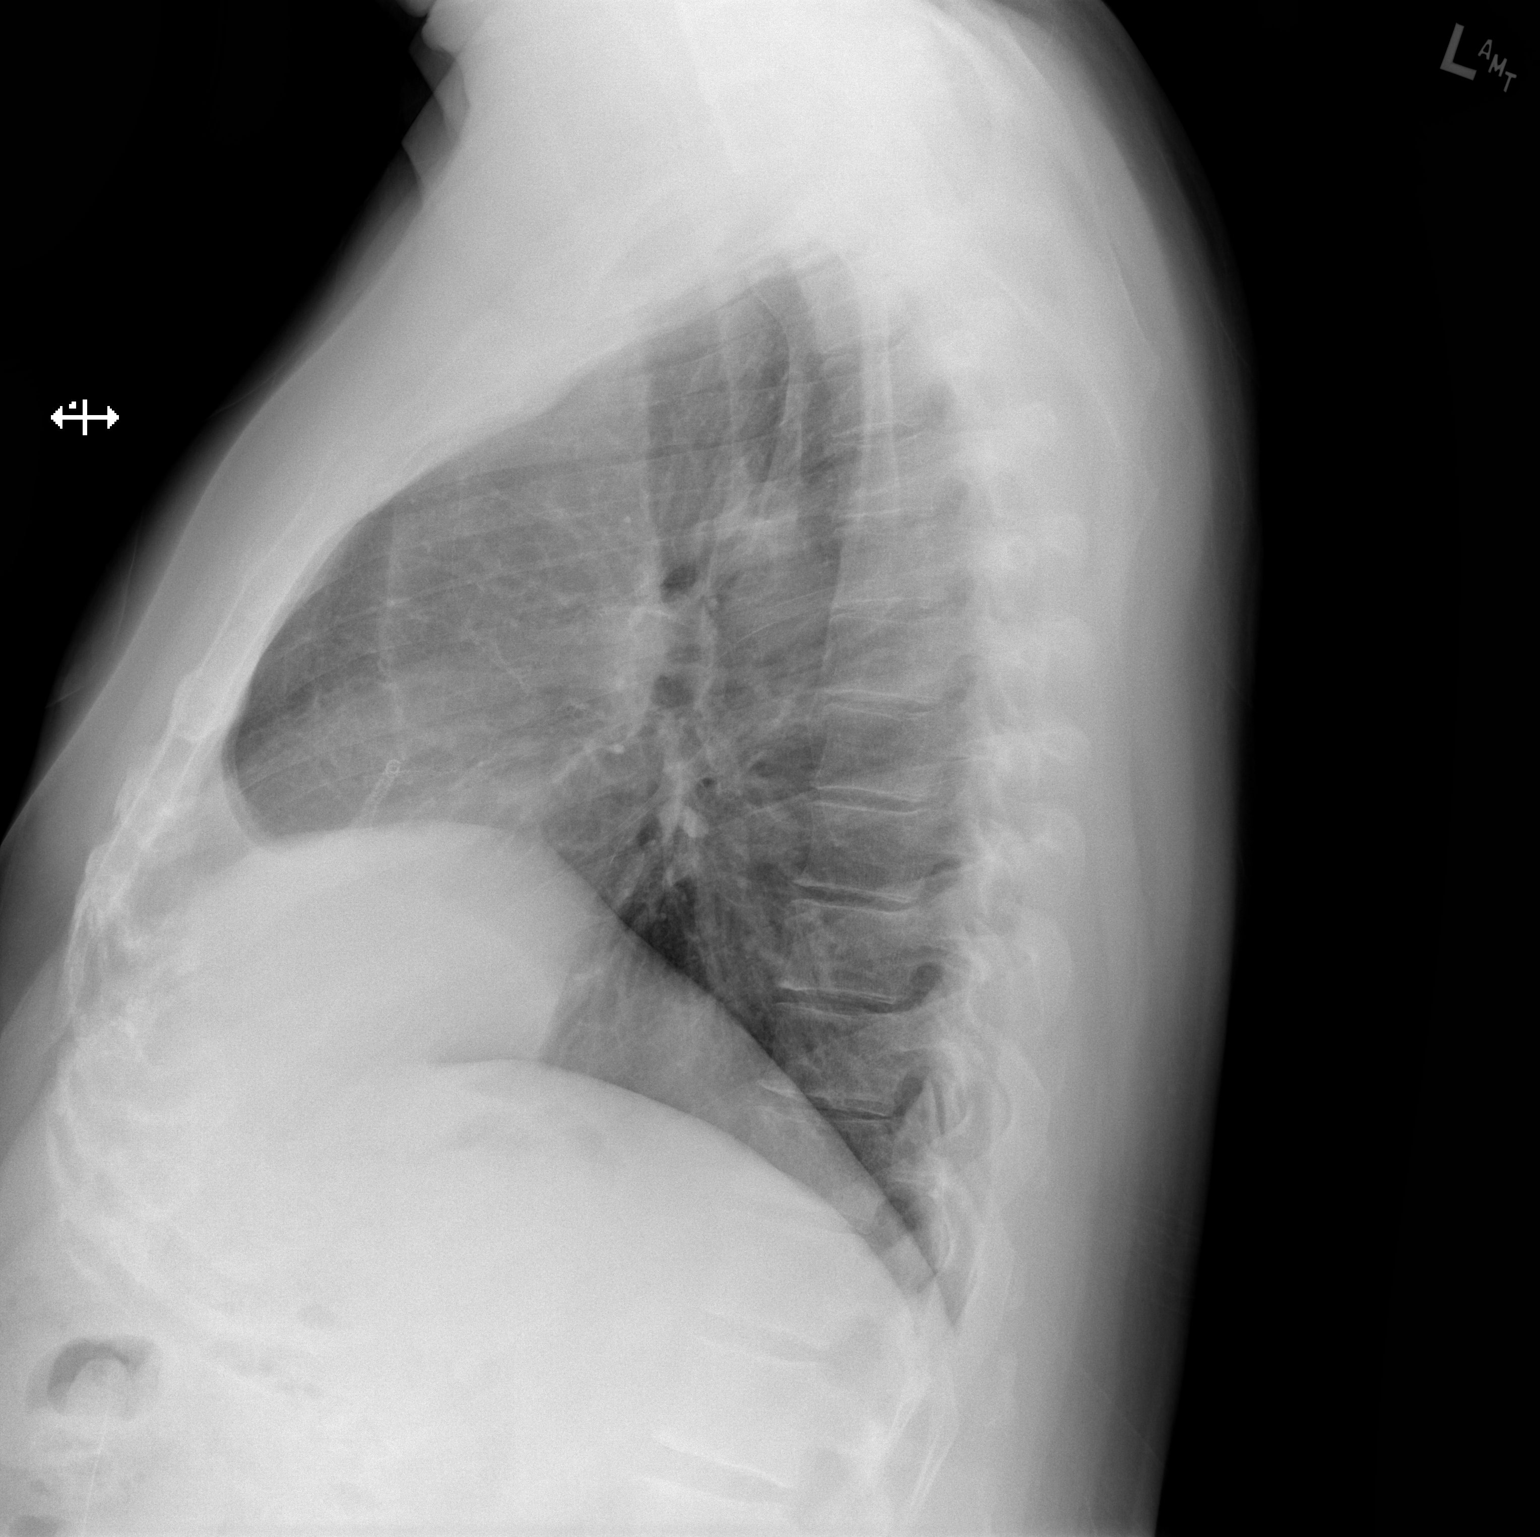

[2 of 2 positions shown; findings below may reference images not displayed]

FINDINGS: Mediastinum and hilar structures normal. Surgical sutures right mid
lung with postsurgical changes in the right lung base. No pleural
effusion or pneumothorax. Prior cervical spine fusion.
IMPRESSION: Postsurgical changes right lung base. No acute cardiopulmonary
disease .

## 2018-07-28 IMAGING — RF DG CERVICAL SPINE 1V
1 series · 1 of 1 positions shown · non-contrast
Comparison: None.

CLINICAL DATA: 56-year-old male with a history of ACDF

EXAM:
DG C-ARM 61-120 MIN; DG CERVICAL SPINE - 1 VIEW

[Series 1: run · 1 of 1 slices shown]
[im 1/1]
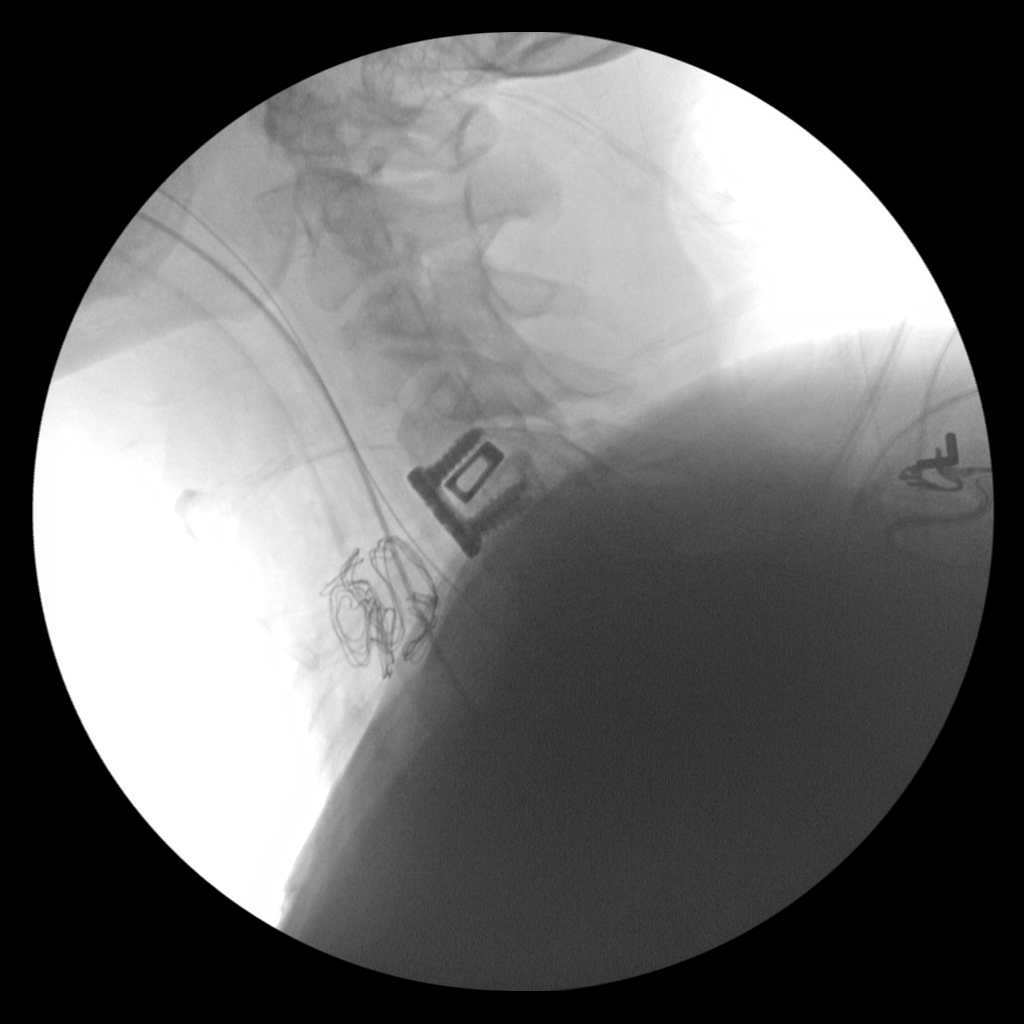

[1 of 1 positions shown; findings below may reference images not displayed]

FINDINGS: Single limited intraoperative cross-table lateral of the cervical
spine, with surgical changes of anterior cervical discectomy
infusion with anterior plate and screw fixation and interbody spacer
placement at C4-C5.

Gastric tube and endotracheal tube in position as well as surgical
sponge within the anterior neck
IMPRESSION: Cross-table lateral demonstrating anterior cervical discectomy
infusion with anterior plate screw fixation and interbody spacer
placement at C4-C5. Please refer to the dictated operative report
for full details of intraoperative findings and procedure.

## 2018-08-07 ENCOUNTER — Other Ambulatory Visit: Payer: Self-pay | Admitting: Family Medicine

## 2018-08-22 DIAGNOSIS — G4733 Obstructive sleep apnea (adult) (pediatric): Secondary | ICD-10-CM | POA: Diagnosis not present

## 2018-09-02 ENCOUNTER — Other Ambulatory Visit: Payer: Self-pay | Admitting: Family Medicine

## 2018-09-06 DIAGNOSIS — S61234A Puncture wound without foreign body of right ring finger without damage to nail, initial encounter: Secondary | ICD-10-CM | POA: Diagnosis not present

## 2018-09-08 DIAGNOSIS — S61234A Puncture wound without foreign body of right ring finger without damage to nail, initial encounter: Secondary | ICD-10-CM | POA: Diagnosis not present

## 2018-09-14 DIAGNOSIS — S61441A Puncture wound with foreign body of right hand, initial encounter: Secondary | ICD-10-CM | POA: Diagnosis not present

## 2018-09-21 DIAGNOSIS — S61245A Puncture wound with foreign body of left ring finger without damage to nail, initial encounter: Secondary | ICD-10-CM | POA: Diagnosis not present

## 2018-09-21 DIAGNOSIS — M65322 Trigger finger, left index finger: Secondary | ICD-10-CM | POA: Diagnosis not present

## 2018-09-22 DIAGNOSIS — G4733 Obstructive sleep apnea (adult) (pediatric): Secondary | ICD-10-CM | POA: Diagnosis not present

## 2018-09-27 ENCOUNTER — Ambulatory Visit (INDEPENDENT_AMBULATORY_CARE_PROVIDER_SITE_OTHER): Payer: Medicare HMO | Admitting: Family Medicine

## 2018-09-27 ENCOUNTER — Encounter: Payer: Self-pay | Admitting: Family Medicine

## 2018-09-27 DIAGNOSIS — Z794 Long term (current) use of insulin: Secondary | ICD-10-CM

## 2018-09-27 DIAGNOSIS — E1149 Type 2 diabetes mellitus with other diabetic neurological complication: Secondary | ICD-10-CM

## 2018-09-27 NOTE — Assessment & Plan Note (Signed)
No charge for visit

## 2018-09-27 NOTE — Progress Notes (Signed)
Patient had to leave office quickly.  I was running behind schedule and I apologized to the patient.  He had gotten a call that the alarm was going off at his house and he needed to return home.    I no charged the visit and I'll be glad to see him when possible.

## 2018-10-06 ENCOUNTER — Encounter: Payer: Self-pay | Admitting: Family Medicine

## 2018-10-06 ENCOUNTER — Ambulatory Visit (INDEPENDENT_AMBULATORY_CARE_PROVIDER_SITE_OTHER): Payer: Medicare HMO | Admitting: Family Medicine

## 2018-10-06 VITALS — BP 118/60 | HR 78 | Temp 98.4°F | Ht 70.0 in

## 2018-10-06 DIAGNOSIS — M5431 Sciatica, right side: Secondary | ICD-10-CM

## 2018-10-06 DIAGNOSIS — E1149 Type 2 diabetes mellitus with other diabetic neurological complication: Secondary | ICD-10-CM | POA: Diagnosis not present

## 2018-10-06 DIAGNOSIS — Z87442 Personal history of urinary calculi: Secondary | ICD-10-CM | POA: Diagnosis not present

## 2018-10-06 DIAGNOSIS — Z794 Long term (current) use of insulin: Secondary | ICD-10-CM | POA: Diagnosis not present

## 2018-10-06 MED ORDER — METFORMIN HCL 1000 MG PO TABS: ORAL_TABLET | ORAL | Status: DC

## 2018-10-06 MED ORDER — DICLOFENAC SODIUM 1 % TD GEL: 2.0000 g | Freq: Every day | TRANSDERMAL | Status: DC | PRN

## 2018-10-06 MED ORDER — OXYCODONE-ACETAMINOPHEN 5-325 MG PO TABS: 1.0000 | ORAL_TABLET | Freq: Four times a day (QID) | ORAL | 0 refills | Status: DC | PRN

## 2018-10-06 MED ORDER — TIZANIDINE HCL 4 MG PO TABS: 2.0000 mg | ORAL_TABLET | Freq: Three times a day (TID) | ORAL | 1 refills | Status: DC | PRN

## 2018-10-06 MED ORDER — TAMSULOSIN HCL 0.4 MG PO CAPS: 0.4000 mg | ORAL_CAPSULE | Freq: Every day | ORAL | 2 refills | Status: DC

## 2018-10-06 MED ORDER — GLIMEPIRIDE 4 MG PO TABS: 4.0000 mg | ORAL_TABLET | Freq: Two times a day (BID) | ORAL | Status: DC

## 2018-10-06 MED ORDER — LOSARTAN POTASSIUM 100 MG PO TABS: 100.0000 mg | ORAL_TABLET | Freq: Every day | ORAL | Status: DC

## 2018-10-06 NOTE — Patient Instructions (Signed)
Go to the lab on the way out.  We'll contact you with your lab report. Use tizanidine for muscle spasms.  Oxycodone for renal stone pain.  Try double dose of flomax and see if that helps with urination.  Take care.  Glad to see you.

## 2018-10-06 NOTE — Progress Notes (Signed)
Sciatica.  He has f/u with ortho pending.  R sided, down the R leg into the R foot.  Spasms in the back recently.  No trauma.  No foot drop.   He has L arm and upper back/neck pain with neck flexion but less with neck extension.    Methocarbamol didn't help.    H/o renal stones.  He has passed ~6 in the last week.    He had to leave last OV due to house alarm.  It turned out to be a false alarm.  His dog was recently killed when it was hit by a car.  Condolences offered.  Discussed.  He is back on insulin in the meantime, he had trouble getting coverage for insulin at the end of 2019.  130-160s usually recently.  No lows.  He has nocturia.  Stream is okay when he isn't having stone passage.  Still on flomax at baseline.    nad ncat Neck supple, no LA rrr ctab abd soft, not ttp Back not tender to palpation in the midline but R SLR positive.   Grip wnl B hands.   Able to bear weight.  [place] data base reviewed, appropriate.

## 2018-10-07 LAB — BASIC METABOLIC PANEL
BUN: 20 mg/dL (ref 6–23)
CO2: 27 mEq/L (ref 19–32)
Calcium: 9.5 mg/dL (ref 8.4–10.5)
Chloride: 102 mEq/L (ref 96–112)
Creatinine, Ser: 1.42 mg/dL (ref 0.40–1.50)
GFR: 51.1 mL/min — ABNORMAL LOW (ref >60.00)
Glucose, Bld: 116 mg/dL — ABNORMAL HIGH (ref 70–99)
Potassium: 4.3 mEq/L (ref 3.5–5.1)
Sodium: 137 mEq/L (ref 135–145)

## 2018-10-07 LAB — HEMOGLOBIN A1C: Hgb A1c MFr Bld: 8.8 % — ABNORMAL HIGH (ref 4.6–6.5)

## 2018-10-09 NOTE — Assessment & Plan Note (Signed)
See notes on follow-up labs.  I expect his A1c to be up as he had a time off of insulin due to lack of coverage.

## 2018-10-09 NOTE — Assessment & Plan Note (Signed)
Use tizanidine for muscle spasms.  He has follow-up with orthopedics pending.

## 2018-10-09 NOTE — Assessment & Plan Note (Signed)
Oxycodone for renal stone pain.  Reasonable to try double dose of flomax and see if that helps with urination, to see if that helps with nocturia.  He can update [place] as needed.

## 2018-10-13 ENCOUNTER — Encounter: Payer: Self-pay | Admitting: *Deleted

## 2018-10-17 ENCOUNTER — Ambulatory Visit: Payer: Medicare HMO | Admitting: Family Medicine

## 2018-10-17 ENCOUNTER — Ambulatory Visit (INDEPENDENT_AMBULATORY_CARE_PROVIDER_SITE_OTHER): Payer: Medicare HMO | Admitting: Family Medicine

## 2018-10-17 ENCOUNTER — Encounter: Payer: Self-pay | Admitting: Family Medicine

## 2018-10-17 DIAGNOSIS — E1149 Type 2 diabetes mellitus with other diabetic neurological complication: Secondary | ICD-10-CM | POA: Diagnosis not present

## 2018-10-17 DIAGNOSIS — Z794 Long term (current) use of insulin: Secondary | ICD-10-CM

## 2018-10-17 MED ORDER — INSULIN DETEMIR 100 UNIT/ML FLEXPEN: PEN_INJECTOR | SUBCUTANEOUS | 0 refills | Status: DC

## 2018-10-17 NOTE — Patient Instructions (Addendum)
Keep adding 1 unit per day as needed in the meantime.    Price check N or NPH insulin at the pharmacy.  See how much a 10 ml via will be.  That will have 1000 units (more than 3 pens).   You would need to give yourself two shots a day with a syringe.    Take care.  Glad to see you.   Recheck A1c in about 3 months at OV.

## 2018-10-17 NOTE — Progress Notes (Signed)
Diabetes:  Using medications without difficulties:yes, taking 50 units of insulin per day Hypoglycemic episodes: no Hyperglycemic episodes: up to 250s Feet problems: still with back pain radiating down to R>L feet.   Blood Sugars averaging: 168s this AM, hadn't checked o/w with recent illness.  A1c d/w pt.   He was prev off insulin for about 6 weeks.  Cost is an issue for patient.    He has back clinic f/u pending.    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    Mult family members recently sick.  He had some GI upset and sinus pressure.  He is better in the meantime.    He is still passing renal stones.     Meds, vitals, and allergies reviewed.   ROS: Per HPI unless specifically indicated in ROS section   GEN: nad, alert and oriented HEENT: mucous membranes moist NECK: supple w/o LA CV: rrr. PULM: ctab, no inc wob ABD: soft, +bs EXT: no edema SKIN: no acute rash

## 2018-10-18 ENCOUNTER — Other Ambulatory Visit: Payer: Self-pay | Admitting: Family Medicine

## 2018-10-18 DIAGNOSIS — M545 Low back pain: Secondary | ICD-10-CM | POA: Diagnosis not present

## 2018-10-18 DIAGNOSIS — M542 Cervicalgia: Secondary | ICD-10-CM | POA: Diagnosis not present

## 2018-10-18 DIAGNOSIS — M5416 Radiculopathy, lumbar region: Secondary | ICD-10-CM | POA: Diagnosis not present

## 2018-10-18 NOTE — Telephone Encounter (Signed)
Pt called pharmacy and insurance. He is requesting insulin vial and syringes as discussed at 2/10 appt sent to [facility].

## 2018-10-19 ENCOUNTER — Telehealth: Payer: Self-pay | Admitting: Family Medicine

## 2018-10-19 MED ORDER — TAMSULOSIN HCL 0.4 MG PO CAPS: 0.8000 mg | ORAL_CAPSULE | Freq: Every day | ORAL | 3 refills | Status: DC

## 2018-10-19 MED ORDER — INSULIN NPH (HUMAN) (ISOPHANE) 100 UNIT/ML ~~LOC~~ SUSP: SUBCUTANEOUS | 11 refills | Status: DC

## 2018-10-19 MED ORDER — "INSULIN SYRINGE 30G X 5/16"" 1 ML MISC": 12 refills | Status: DC

## 2018-10-19 NOTE — Telephone Encounter (Signed)
I sent the prescription.  He was previously taking 50 units of insulin once a day. I changed his prescription to NPH so that he will take 30 units in the morning and 15 units in the evening. He is slightly and intentionally underdosed to try to limit hypoglycemia.  Have him update me with his sugar next week and we will go from there.  We will likely have to change his insulin dosing gradually. Also sent prescription for syringes.  Update me as needed in the meantime.  Thanks.

## 2018-10-19 NOTE — Telephone Encounter (Signed)
Patient notified as instructed by telephone and verbalized understanding. 

## 2018-10-19 NOTE — Telephone Encounter (Signed)
Pt came into office and states he has run out of Flomax. The pharmacy can't refill the prescription again until March. He states the provider asked him to double up on taking the medicine and he ran out. Is there something else that could be called in? Please advise.

## 2018-10-19 NOTE — Addendum Note (Signed)
Addended by: Tonia Ghent on: 10/19/2018 04:43 PM   Modules accepted: Orders

## 2018-10-19 NOTE — Telephone Encounter (Signed)
I resent the Flomax with the higher dose listed.  That should allow for the earlier refill.  If that does not work and let me know.  Thanks.

## 2018-10-20 ENCOUNTER — Ambulatory Visit: Payer: Self-pay | Admitting: Internal Medicine

## 2018-10-20 ENCOUNTER — Other Ambulatory Visit: Payer: Self-pay | Admitting: *Deleted

## 2018-10-20 MED ORDER — "INSULIN SYRINGE 31G X 5/16"" 1 ML MISC": 3 refills | Status: DC

## 2018-10-20 NOTE — Assessment & Plan Note (Signed)
A1c d/w pt.   He was prev off insulin for about 6 weeks.  Cost is an issue for patient.   We will try changing to NPH insulin.  See follow-up phone conversation and orders.  Rationale for change with twice daily dosing discussed with patient.  He was willing to draw up his insulin in a syringe.  He knows how to do that. See after visit summary.

## 2018-10-23 ENCOUNTER — Other Ambulatory Visit: Payer: Self-pay | Admitting: Family Medicine

## 2018-10-25 ENCOUNTER — Telehealth: Payer: Self-pay | Admitting: *Deleted

## 2018-10-25 MED ORDER — INSULIN NPH (HUMAN) (ISOPHANE) 100 UNIT/ML ~~LOC~~ SUSP: SUBCUTANEOUS | 11 refills | Status: DC

## 2018-10-25 NOTE — Telephone Encounter (Signed)
Patient advised.

## 2018-10-25 NOTE — Telephone Encounter (Signed)
Patient phoned in saying that he is at the pharmacy and the insulin that has been called in is too expensive and that he was told they had one that was cheaper.  I seem to remember a phone note about this but I am unable to locate it in Epic.  Patient doesn't know the name of the medication.

## 2018-10-25 NOTE — Telephone Encounter (Addendum)
Changed and sent. Thanks!

## 2018-10-25 NOTE — Addendum Note (Signed)
Addended by: Tonia Ghent on: 10/25/2018 10:49 AM   Modules accepted: Orders

## 2018-10-27 DIAGNOSIS — M545 Low back pain: Secondary | ICD-10-CM | POA: Diagnosis not present

## 2018-10-31 ENCOUNTER — Ambulatory Visit: Payer: Self-pay | Admitting: Cardiology

## 2018-10-31 DIAGNOSIS — M5416 Radiculopathy, lumbar region: Secondary | ICD-10-CM | POA: Diagnosis not present

## 2018-11-01 DIAGNOSIS — H35013 Changes in retinal vascular appearance, bilateral: Secondary | ICD-10-CM | POA: Diagnosis not present

## 2018-11-01 DIAGNOSIS — H524 Presbyopia: Secondary | ICD-10-CM | POA: Diagnosis not present

## 2018-11-01 DIAGNOSIS — H35033 Hypertensive retinopathy, bilateral: Secondary | ICD-10-CM | POA: Diagnosis not present

## 2018-11-01 DIAGNOSIS — H25041 Posterior subcapsular polar age-related cataract, right eye: Secondary | ICD-10-CM | POA: Diagnosis not present

## 2018-11-01 DIAGNOSIS — E119 Type 2 diabetes mellitus without complications: Secondary | ICD-10-CM | POA: Diagnosis not present

## 2018-11-01 DIAGNOSIS — H5203 Hypermetropia, bilateral: Secondary | ICD-10-CM | POA: Diagnosis not present

## 2018-11-01 DIAGNOSIS — I10 Essential (primary) hypertension: Secondary | ICD-10-CM | POA: Diagnosis not present

## 2018-11-01 LAB — HM DIABETES EYE EXAM

## 2018-11-03 ENCOUNTER — Telehealth: Payer: Self-pay

## 2018-11-03 NOTE — Telephone Encounter (Signed)
The team health note did not come through the computer but was paper faxed to [facility].  On 11/02/18 at 5:42 pm pt spoke with TH that his BS was 539. Then the note had pt BS was 509; pt uses Novalin 30 units in Am and 15 units in PM. Unable to reach pt; left v/m for pt to cb to [facility]. I did speak with Tony Manning, pts daughter (DPR signed) and Tony Manning has not spoken with pt today but she will try to call her mom and dad and have one of them call our office back or Tony Manning will cb with update on how pt is doing today and what his BS is today. Pt already had an appt on 11/04/18 at 8:30. Copy of TH note in Dr Josefine Class in box. FYI to Dr [name].

## 2018-11-03 NOTE — Telephone Encounter (Signed)
kristen pts daughter called back and pt is feeling OK today; pts BS today is either 206 or 260 which is still high but pt feeling OK today and will keep appt on 11/04/18 at 8:30. If pt condition changes or worsens or BS goes 500> prior to appt pt will go to [facility] or ED. FYI to Dr [name].

## 2018-11-04 ENCOUNTER — Ambulatory Visit (INDEPENDENT_AMBULATORY_CARE_PROVIDER_SITE_OTHER): Payer: Medicare HMO | Admitting: Family Medicine

## 2018-11-04 ENCOUNTER — Encounter: Payer: Self-pay | Admitting: Family Medicine

## 2018-11-04 DIAGNOSIS — Z794 Long term (current) use of insulin: Secondary | ICD-10-CM | POA: Diagnosis not present

## 2018-11-04 DIAGNOSIS — E1149 Type 2 diabetes mellitus with other diabetic neurological complication: Secondary | ICD-10-CM | POA: Diagnosis not present

## 2018-11-04 NOTE — Progress Notes (Signed)
Diabetes:  Using medications without difficulties: yes Hypoglycemic episodes: one episodes, early AM.  Cautions d/w pt.   Hyperglycemic episodes: one episode up to 500 without sx Feet problems: R leg tingling from back source.  Blood Sugars averaging: usually 100-200 recently.   Exercise limited by back pain.   Eye exam recently done at [facility].   He prev had 1.5 months off insulin.  Now on novolin N.   Previous time off insulin likely affects his A1c.  He had one episode with a low blood sugar of 68 overnight.  No other similar lows.  He had one morning where his a.m. blood sugar was 215.  It increased to 539 after he had eaten a jelly sandwich at lunch.  Discussed options about insulin dosing and also avoiding jelly sandwiches and high carbohydrate meals.  Meds, vitals, and allergies reviewed.   ROS: Per HPI unless specifically indicated in ROS section   GEN: nad, alert and oriented HEENT: mucous membranes moist NECK: supple w/o LA CV: rrr. PULM: ctab, no inc wob ABD: soft, +bs EXT: no edema SKIN: no acute rash

## 2018-11-04 NOTE — Patient Instructions (Addendum)
Keep checking your sugar through the day.  If you have any lows in the middle of the night, then cut the evening dose of insulin back to 12 units, down from 15.  If you have sugar >200 in the AM prior to eating, given 32 units instead of 30.  Let me know that does in the next 10 days.   Plan to recheck here in early May. A1c at the visit.  Take care.  Glad to see you.

## 2018-11-04 NOTE — Telephone Encounter (Signed)
Noted.  Thanks.  We will see at office visit. 

## 2018-11-06 NOTE — Assessment & Plan Note (Signed)
Discussed options. Would keep checking sugar through the day.  If any lows in the middle of the night, then cut the evening dose of insulin back to 12 units, down from 15.  If sugar >200 in the AM prior to eating, given 32 units instead of 30.  He will let me know that does in the next 10 days.  Plan to recheck here in early May. A1c at the visit.  Routine cautions given to patient.  He agrees.

## 2018-11-11 DIAGNOSIS — M5416 Radiculopathy, lumbar region: Secondary | ICD-10-CM | POA: Diagnosis not present

## 2018-11-15 ENCOUNTER — Encounter: Payer: Self-pay | Admitting: Family Medicine

## 2018-11-16 ENCOUNTER — Other Ambulatory Visit: Payer: Self-pay | Admitting: Family Medicine

## 2018-11-16 NOTE — Telephone Encounter (Signed)
Left message on voicemail for patient to call back.  See recent lab results on 10/06/18 regarding appointment with endocrinology.

## 2018-11-18 NOTE — Telephone Encounter (Signed)
Sent. I don't want him to run out in the meantime.  Thanks.

## 2018-11-22 ENCOUNTER — Ambulatory Visit: Payer: Self-pay | Admitting: Family Medicine

## 2018-11-22 MED ORDER — OXYCODONE-ACETAMINOPHEN 5-325 MG PO TABS: 1.0000 | ORAL_TABLET | Freq: Four times a day (QID) | ORAL | 0 refills | Status: DC | PRN

## 2018-11-22 NOTE — Telephone Encounter (Signed)
I sent percocet rx.  He can break them in half if needing a lower dose. Update [place] as needed.  Thanks.

## 2018-11-22 NOTE — Telephone Encounter (Signed)
Pt. Reports he is having kidney stone pain. States he has passed 140 stones over time. Has  Bilateral flank pain, low abdomen and groin pain. Passed a stone yesterday the size "of an eraser." Has tried calling his "kidney doctor and can't get any body." Pt. Does not want an appointment. States he needs pain medication. Has 3 Percocet left."I don't need anything real strong, just something to help when they start moving." Can not take Tramadol. Please send it to [facility]. Please advise.  Answer Assessment - Initial Assessment Questions 1. LOCATION: "Where does it hurt?" (e.g., left, right)     Both sides 2. ONSET: "When did the pain start?"      Started this week 3. SEVERITY: "How bad is the pain?" (e.g., Scale 1-10; mild, moderate, or severe)   - MILD (1-3): doesn't interfere with normal activities    - MODERATE (4-7): interferes with normal activities or awakens from sleep    - SEVERE (8-10): excruciating pain and patient unable to do normal activities (stays in bed)       At it's worse 8-9 4. PATTERN: "Does the pain come and go, or is it constant?"      Comes and goes 5. CAUSE: "What do you think is causing the pain?"     Kidney stone 6. OTHER SYMPTOMS:  "Do you have any other symptoms?" (e.g., fever, abdominal pain, vomiting, leg weakness, burning with urination, blood in urine)     Low abdominal pain and groin pain, has had blood in urine 7. PREGNANCY:  "Is there any chance you are pregnant?" "When was your last menstrual period?"     n/a  Protocols used: FLANK PAIN-A-AH

## 2018-11-22 NOTE — Telephone Encounter (Signed)
Patient advised.

## 2018-11-25 ENCOUNTER — Telehealth: Payer: Self-pay

## 2018-11-25 NOTE — Telephone Encounter (Signed)
Pt called and stated that he has an order to stop Plavix in order to get shot would like to know if he should post pone it due to the virus.

## 2018-11-25 NOTE — Telephone Encounter (Signed)
Left vm with notice.

## 2018-11-25 NOTE — Telephone Encounter (Signed)
He can stop and have it done. Stop for 6 days

## 2018-11-29 ENCOUNTER — Other Ambulatory Visit: Payer: Self-pay | Admitting: Cardiology

## 2018-12-05 ENCOUNTER — Ambulatory Visit: Payer: Self-pay | Admitting: Cardiology

## 2018-12-30 ENCOUNTER — Telehealth: Payer: Self-pay | Admitting: Family Medicine

## 2018-12-30 NOTE — Telephone Encounter (Signed)
Placed form in Dr. Duncan's inbox 

## 2018-12-30 NOTE — Telephone Encounter (Signed)
Call patient when form is ready.

## 2018-12-30 NOTE — Telephone Encounter (Signed)
PT dropped off Disability Parking Placard form to be filled out. Placed form in [place] tower for Dr. [name].

## 2019-01-01 NOTE — Telephone Encounter (Signed)
Done. Thanks.

## 2019-01-03 NOTE — Telephone Encounter (Signed)
Patient advised. Placed form up front for pick up. Copy made. No charge.

## 2019-01-11 ENCOUNTER — Other Ambulatory Visit: Payer: Self-pay | Admitting: Cardiology

## 2019-01-11 MED ORDER — LOSARTAN POTASSIUM 100 MG PO TABS: 100.0000 mg | ORAL_TABLET | Freq: Every day | ORAL | 2 refills | Status: DC

## 2019-01-12 ENCOUNTER — Encounter: Payer: Self-pay | Admitting: Family Medicine

## 2019-01-12 ENCOUNTER — Ambulatory Visit (INDEPENDENT_AMBULATORY_CARE_PROVIDER_SITE_OTHER): Payer: Medicare HMO | Admitting: Family Medicine

## 2019-01-12 ENCOUNTER — Other Ambulatory Visit: Payer: Self-pay

## 2019-01-12 VITALS — BP 116/80 | HR 62 | Temp 97.8°F | Ht 70.0 in | Wt 217.0 lb

## 2019-01-12 DIAGNOSIS — R300 Dysuria: Secondary | ICD-10-CM

## 2019-01-12 DIAGNOSIS — E1149 Type 2 diabetes mellitus with other diabetic neurological complication: Secondary | ICD-10-CM | POA: Diagnosis not present

## 2019-01-12 DIAGNOSIS — Z794 Long term (current) use of insulin: Secondary | ICD-10-CM

## 2019-01-12 LAB — POC URINALSYSI DIPSTICK (AUTOMATED)
Bilirubin, UA: NEGATIVE
Blood, UA: NEGATIVE
Glucose, UA: POSITIVE — AB
Ketones, UA: NEGATIVE
Leukocytes, UA: NEGATIVE
Nitrite, UA: NEGATIVE
Protein, UA: NEGATIVE
Spec Grav, UA: 1.025 (ref 1.010–1.025)
Urobilinogen, UA: 0.2 E.U./dL
pH, UA: 5.5 (ref 5.0–8.0)

## 2019-01-12 MED ORDER — CEPHALEXIN 500 MG PO CAPS: 500.0000 mg | ORAL_CAPSULE | Freq: Two times a day (BID) | ORAL | 0 refills | Status: DC

## 2019-01-12 NOTE — Progress Notes (Signed)
His daughter recently gave birth to a baby boy.  Congrats offered.    covid cautions d/w pt.    3-4 days of sx.  Burning with urination.  U/a with glucose noted.  This didn't feel likely typical kidney stone pain.  No rash, no discharge.   No FCNAVD.  No blood in urine.  Not pain sitting.  Still on flomax, 2 tabs a day.    Diabetes.  D/w pt about endo referral, ordered. Sugar has been 140-160 recently, on overage.    Meds, vitals, and allergies reviewed.   ROS: Per HPI unless specifically indicated in ROS section   nad ncat Neck supple, no LA ctab rrr Abdomen soft.  Nontender.  No rebound. Prostate not ttp Normal ext genitalia w/o discharge or rash.  Skin appears well perfused.

## 2019-01-12 NOTE — Patient Instructions (Addendum)
Rosaria Ferries will call you about the endocrine referral.  Drink plenty of water and start the antibiotics today.  We'll contact you with your lab report.  Take care.

## 2019-01-13 LAB — URINE CULTURE
MICRO NUMBER:: 454138
Result:: NO GROWTH
SPECIMEN QUALITY:: ADEQUATE

## 2019-01-15 DIAGNOSIS — R300 Dysuria: Secondary | ICD-10-CM | POA: Insufficient documentation

## 2019-01-15 NOTE — Assessment & Plan Note (Signed)
Prostate not tender to palpation.  Possible UTI.  Start Keflex.  Check urine culture.  This does not seem typical for kidney stone for patient.  Routine cautions given to patient.  He agrees.  Nontoxic.  Okay for outpatient follow-up.

## 2019-01-15 NOTE — Assessment & Plan Note (Addendum)
Refer back to endocrinology.  No change in meds at this point.

## 2019-01-20 ENCOUNTER — Telehealth: Payer: Self-pay

## 2019-01-20 NOTE — Telephone Encounter (Signed)
Left message for patient to call back about results. 

## 2019-01-26 ENCOUNTER — Telehealth: Payer: Self-pay | Admitting: Family Medicine

## 2019-01-26 NOTE — Telephone Encounter (Signed)
Noted in lab results.

## 2019-01-26 NOTE — Telephone Encounter (Signed)
Best number [phone] Pt returned your call he said he is doing better everything is ok right

## 2019-02-09 ENCOUNTER — Encounter: Payer: Self-pay | Admitting: Cardiology

## 2019-02-09 ENCOUNTER — Ambulatory Visit: Payer: Medicare HMO | Admitting: Cardiology

## 2019-02-09 ENCOUNTER — Other Ambulatory Visit: Payer: Self-pay

## 2019-02-09 VITALS — BP 141/78 | HR 70 | Ht 70.0 in | Wt 217.0 lb

## 2019-02-09 DIAGNOSIS — E1122 Type 2 diabetes mellitus with diabetic chronic kidney disease: Secondary | ICD-10-CM | POA: Diagnosis not present

## 2019-02-09 DIAGNOSIS — E782 Mixed hyperlipidemia: Secondary | ICD-10-CM | POA: Diagnosis not present

## 2019-02-09 DIAGNOSIS — I10 Essential (primary) hypertension: Secondary | ICD-10-CM

## 2019-02-09 DIAGNOSIS — N183 Chronic kidney disease, stage 3 unspecified: Secondary | ICD-10-CM

## 2019-02-09 DIAGNOSIS — E1165 Type 2 diabetes mellitus with hyperglycemia: Secondary | ICD-10-CM

## 2019-02-09 DIAGNOSIS — I25118 Atherosclerotic heart disease of native coronary artery with other forms of angina pectoris: Secondary | ICD-10-CM | POA: Diagnosis not present

## 2019-02-09 DIAGNOSIS — I129 Hypertensive chronic kidney disease with stage 1 through stage 4 chronic kidney disease, or unspecified chronic kidney disease: Secondary | ICD-10-CM | POA: Diagnosis not present

## 2019-02-09 DIAGNOSIS — IMO0002 Reserved for concepts with insufficient information to code with codable children: Secondary | ICD-10-CM

## 2019-02-09 MED ORDER — NITROGLYCERIN 0.4 MG SL SUBL: 0.4000 mg | SUBLINGUAL_TABLET | SUBLINGUAL | 3 refills | Status: DC | PRN

## 2019-02-09 NOTE — Progress Notes (Signed)
Primary Physician/Referring:  Tonia Ghent, MD  Patient ID: Tony Manning, male    DOB: 02-02-1961, 58 y.o.   MRN: [id number]  Chief Complaint  Patient presents with  . Coronary Artery Disease  . Hypertension  . Follow-up    HPI: Tony Manning  is a 58 y.o. male  with coronary artery disease status post PCI to RCA and LAD, long-standing degenerative disc disease and involving neck and shoulders, hyperlipidemia, OSA on CPAP, controlled hypertension presents for annual visit.   DM continues to be uncontrolled. Has continued arthritis issues especially back and also bilateral shoulder.  Continues to have occasional angina which is NTG responsive. He has not been very active.   Past Medical History:  Diagnosis Date  . Anginal pain ([place])   . Basal cell carcinoma of face   . Chest pain, unspecified   . Coronary atherosclerosis of native coronary artery    s/p stent x4  . Depression   . Diabetes mellitus without mention of complication   . Diverticulosis   . Dyspnea   . Dysrhythmia   . Erectile dysfunction   . Essential hypertension, benign   . GERD (gastroesophageal reflux disease)   . Heart murmur   . History of kidney stones   . History of nephrolithiasis   . Migraine   . Myocardial infarction ([place])   . Neuromuscular disorder ([place])   . Osteoarthritis   . Pneumonia   . Postsurgical percutaneous transluminal coronary angioplasty status    s/p stent x4  . Sarcoidosis   . Unspecified sleep apnea     Past Surgical History:  Procedure Laterality Date  . ANTERIOR CERVICAL DECOMP/DISCECTOMY FUSION N/A 04/07/2017   Procedure: ANTERIOR CERVICAL DECOMPRESSION FUSION, CERVICAL 4-5 WITH INSTRUMENTATION AND ALLOGRAFT;  Surgeon: Phylliss Bob, MD;  Location: [facility];  Service: Orthopedics;  Laterality: N/A;  ANTERIOR CERVICAL DECOMPRESSION FUSION, CERVICAL 4-5 WITH INSTRUMENTATION AND ALLOGRAFT; REQUEST 2.5 HOURS AND FLIP ROOM  . APPENDECTOMY  2005  . CARPAL TUNNEL  RELEASE    . CERVICAL FUSION  2009  . CORONARY ANGIOPLASTY WITH STENT PLACEMENT    . CORONARY STENT INTERVENTION N/A 10/16/2016   Procedure: Coronary Stent Intervention;  Surgeon: Adrian Prows, MD;  Location: [facility];  Service: Cardiovascular;  Laterality: N/A;  . ELBOW SURGERY     bilateral  . KNEE SURGERY     Bilateral   . LEFT HEART CATH AND CORONARY ANGIOGRAPHY N/A 10/16/2016   Procedure: Left Heart Cath and Coronary Angiography;  Surgeon: Adrian Prows, MD;  Location: [facility];  Service: Cardiovascular;  Laterality: N/A;  . LEFT HEART CATHETERIZATION WITH CORONARY ANGIOGRAM N/A 06/14/2014   Procedure: LEFT HEART CATHETERIZATION WITH CORONARY ANGIOGRAM;  Surgeon: Laverda Page, MD;  Location: [facility];  Service: Cardiovascular;  Laterality: N/A;  . LUNG BIOPSY     sarcoid  . ROTATOR CUFF REPAIR     left  . SHOULDER ARTHROSCOPY WITH SUBACROMIAL DECOMPRESSION Left 06/16/2018   Procedure: LEFT SHOULDER ARTHROSCOPY WITH ROTATOR CUFF DEBRIDEMENT AND  SUBACROMIAL DECOMPRESSION;  Surgeon: Tania Ade, MD;  Location: [facility];  Service: Orthopedics;  Laterality: Left;    Social History   Socioeconomic History  . Marital status: Married    Spouse name: Not on file  . Number of children: 2  . Years of education: Not on file  . Highest education level: Not on file  Occupational History  . Occupation: disabled    Fish farm manager: unemployed  Social Needs  .  Financial resource strain: Not on file  . Food insecurity:    Worry: Not on file    Inability: Not on file  . Transportation needs:    Medical: Not on file    Non-medical: Not on file  Tobacco Use  . Smoking status: Never Smoker  . Smokeless tobacco: Former Systems developer    Types: Chew  . Tobacco comment: trying to quit  Substance and Sexual Activity  . Alcohol use: No    Alcohol/week: 0.0 standard drinks  . Drug use: No  . Sexual activity: Yes    Birth control/protection: Condom  Lifestyle  . Physical activity:     Days per week: Not on file    Minutes per session: Not on file  . Stress: Not on file  Relationships  . Social connections:    Talks on phone: Not on file    Gets together: Not on file    Attends religious service: Not on file    Active member of club or organization: Not on file    Attends meetings of clubs or organizations: Not on file    Relationship status: Not on file  . Intimate partner violence:    Fear of current or ex partner: Not on file    Emotionally abused: Not on file    Physically abused: Not on file    Forced sexual activity: Not on file  Other Topics Concern  . Not on file  Social History Narrative   Married, 1987   2 kids   On disability   Review of Systems  Constitution: Negative for chills, decreased appetite, malaise/fatigue and weight gain.  Cardiovascular: Positive for chest pain and dyspnea on exertion. Negative for leg swelling and syncope.  Endocrine: Negative for cold intolerance.  Hematologic/Lymphatic: Does not bruise/bleed easily.  Musculoskeletal: Positive for back pain, joint pain (bilateral shoulders) and neck pain. Negative for joint swelling.  Gastrointestinal: Negative for abdominal pain, anorexia, change in bowel habit, hematochezia and melena.  Neurological: Negative for headaches and light-headedness.  Psychiatric/Behavioral: Negative for depression and substance abuse.  All other systems reviewed and are negative.     Objective  Blood pressure (!) 141/78, pulse 70, height 5\' 10"  (1.778 m), weight 217 lb (98.4 kg), SpO2 100 %. Body mass index is 31.14 kg/m.    Physical Exam  Constitutional: He appears well-developed and well-nourished. No distress.  HENT:  Head: Atraumatic.  Eyes: Conjunctivae are normal.  Neck: Neck supple. No JVD present. No thyromegaly present.  Cardiovascular: Normal rate, regular rhythm, normal heart sounds and intact distal pulses. Exam reveals no gallop.  No murmur heard. Trace edema  Pulmonary/Chest:  Effort normal and breath sounds normal.  Abdominal: Soft. Bowel sounds are normal.  Musculoskeletal: Normal range of motion.  Neurological: He is alert.  Skin: Skin is warm and dry.  Psychiatric: He has a normal mood and affect.   Radiology: No results found.  Laboratory examination:   CMP Latest Ref Rng & Units 10/06/2018 06/07/2018 05/05/2018  Glucose 70 - 99 mg/dL 116(H) 235(H) 233(H)  BUN 6 - 23 mg/dL 20 17 19   Creatinine 0.40 - 1.50 mg/dL 1.42 1.26(H) 1.24  Sodium 135 - 145 mEq/L 137 138 138  Potassium 3.5 - 5.1 mEq/L 4.3 4.0 5.1  Chloride 96 - 112 mEq/L 102 106 106  CO2 19 - 32 mEq/L 27 24 27   Calcium 8.4 - 10.5 mg/dL 9.5 9.2 10.3  Total Protein 6.0 - 8.3 g/dL - - 7.7  Total Bilirubin 0.2 -  1.2 mg/dL - - 0.4  Alkaline Phos 39 - 117 U/L - - 77  AST 0 - 37 U/L - - 17  ALT 0 - 53 U/L - - 23   CBC Latest Ref Rng & Units 06/07/2018 05/05/2018 05/07/2017  WBC 4.0 - 10.5 K/uL 8.0 8.6 7.0  Hemoglobin 13.0 - 17.0 g/dL 14.1 14.6 14.3  Hematocrit 39.0 - 52.0 % 43.2 43.2 43.0  Platelets 150 - 400 K/uL 210 231.0 203.0   Lipid Panel     Component Value Date/Time   CHOL 129 12/07/2017 1043   TRIG 172.0 (H) 12/07/2017 1043   HDL 38.60 (L) 12/07/2017 1043   CHOLHDL 3 12/07/2017 1043   VLDL 34.4 12/07/2017 1043   LDLCALC 56 12/07/2017 1043   HEMOGLOBIN A1C Lab Results  Component Value Date   HGBA1C 8.8 (H) 10/06/2018   MPG 200 06/07/2018   TSH Recent Labs    03/21/18 1205  TSH 2.22   PRN Meds:. Medications Discontinued During This Encounter  Medication Reason  . cephALEXin (KEFLEX) 500 MG capsule Error  . nitroGLYCERIN (NITROSTAT) 0.4 MG SL tablet Reorder   Current Meds  Medication Sig  . aspirin 81 MG chewable tablet Chew 81 mg by mouth at bedtime.  [name] Kitchen atorvastatin (LIPITOR) 40 MG tablet TAKE 1 TABLET BY MOUTH EVERY DAY  . carvedilol (COREG) 6.25 MG tablet TAKE 1 TABLET BY MOUTH TWICE A DAY  . clopidogrel (PLAVIX) 75 MG tablet Take 75 mg by mouth daily.   .  cyclobenzaprine (FLEXERIL) 5 MG tablet Take 5-10 mg by mouth at bedtime as needed.  . diclofenac sodium (VOLTAREN) 1 % GEL Apply 2-4 g topically daily as needed (joint pain).  [name] Kitchen glimepiride (AMARYL) 4 MG tablet TAKE 1 TABLET BY MOUTH TWICE A DAY  . insulin NPH Human (NOVOLIN N) 100 UNIT/ML injection Inject 30 units in the AM, 15 units in the PM  . losartan (COZAAR) 100 MG tablet Take 1 tablet (100 mg total) by mouth daily.  . meloxicam (MOBIC) 15 MG tablet Take 1 tablet by mouth daily as needed.   . metFORMIN (GLUCOPHAGE) 1000 MG tablet TAKE 1 TABLET BY MOUTH TWICE A DAY WITH MEALS  . methocarbamol (ROBAXIN) 500 MG tablet Take 1 tablet by mouth 4 (four) times daily as needed.  . nitroGLYCERIN (NITROSTAT) 0.4 MG SL tablet Place 1 tablet (0.4 mg total) under the tongue every 5 (five) minutes as needed for chest pain.  [name] Kitchen oxyCODONE-acetaminophen (PERCOCET) 5-325 MG tablet Take 1 tablet by mouth every 6 (six) hours as needed for severe pain (for kidney stones).  . tamsulosin (FLOMAX) 0.4 MG CAPS capsule Take 2 capsules (0.8 mg total) by mouth daily. (Patient taking differently: Take 0.4 mg by mouth daily. )  . [DISCONTINUED] nitroGLYCERIN (NITROSTAT) 0.4 MG SL tablet Place 1 tablet (0.4 mg total) under the tongue every 5 (five) minutes as needed for chest pain.    Cardiac Studies:   Lexiscan myoview stress test 06/05/2016: 1. The resting electrocardiogram demonstrated normal sinus rhythm, normal resting conduction, no resting arrhythmias and nonspecific T changes. Stress EKG is non-diagnostic for ischemia as it a pharmacologic stress using Lexiscan. Stress symptoms included dyspnea. 2. The perfusion imaging study demonstrates a small sized moderate to severe ischemia involving the apical anterior, apical septal and apical anterolateral wall probably in the distribution of mid to distal LAD. Left ventricular systolic function calculated by QGS was mildly depressed at 46%. Compared to the study done on  10/31/2010, ischemia is new. Intermediate  risk scan. Impression: EKG 12/06/2017: Normal sinus rhythm at rate of 63 beats ON, normal axis. Poor progression, cannot exclude anteroseptal infarct old. No evidence of ischemia, normal QT interval. No significant change from 06/07/2017.  Echocardiogram 02/01/2018: Left ventricle cavity is normal in size. Normal global wall motion. Indeterminate diastolic filling pattern, indeterminate LAP. Calculated EF 55%. No significant valvular abnormality. Inadequate tricuspid regurgitation jet to estimate pulmonary artery pressure. Normal right atrial pressure.  Coronary angiogram 10/16/2016: 3.0 x 38 mm onyx DES Prox RCA. 06/14/2014: Thrombectomy of the mid RCA & Stenting with 3.0 x 18 mm Xience Alpine DES. Stenting mid LAD 3.0 x 18 mm Xience Alpine DES. Patent Mid LAD 2.75x12 mm Taxus, 2.5x12 Distal RCA stent x 2 from 2004 and 2007. Normal LVEF. Diffuse CAD  Assessment   Essential hypertension, benign - Plan: EKG 12-Lead  Coronary artery disease of native artery of native heart with stable angina pectoris ([place]) - 10/16/2016: 3.0 x 38 mm onyx DES Prox RCA. 06/14/2014: mid RCA 3.0 x 18 mm Xience Alpine DES. Mid 3.0 x 18 mm Xience Alpine. LAD 2.75x12 mm Taxus, 2.5x12 04 & 07 - Plan: nitroGLYCERIN (NITROSTAT) 0.4 MG SL tablet  Mixed hyperlipidemia  Uncontrolled diabetes with stage 3 chronic kidney disease GFR 30-59 (HCC)  EKG 02/09/2019: Normal sinus rhythm with rate of 68 bpm, normal axis.  No evidence of ischemia, normal EKG.    Recommendations:   Patient presents here for annual visit and follow-up of coronary artery disease.  I refilled his prescription for nitroglycerin.  He has stable angina class 1-2.  Lipids are still uncontrolled with elevated triglycerides probably related to uncontrolled diabetes mellitus. This should improve with diabetes control.  Otherwise could consider Vascepa. He has now developed stage III chronic kidney disease related to  uncontrolled diabetes mellitus.  I have discussed with the patient regarding making lifestyle changes again and weight loss and watching his carbohydrate intake.  Otherwise he is doing well from cardiac standpoint, fortunately has not had any ACS presentations recently.  EKG reveals normal sinus rhythm.  I'll see him back on an annual basis.  Adrian Prows, MD, [facility] 02/09/2019, 11:05 PM [facility]. [place] Pager: [phone] Office: [phone] If no answer Cell [phone]

## 2019-02-10 ENCOUNTER — Ambulatory Visit: Payer: Medicare HMO | Admitting: Family Medicine

## 2019-02-21 ENCOUNTER — Other Ambulatory Visit: Payer: Self-pay | Admitting: Family Medicine

## 2019-03-01 ENCOUNTER — Other Ambulatory Visit: Payer: Self-pay | Admitting: Cardiology

## 2019-03-01 NOTE — Telephone Encounter (Signed)
Please fill

## 2019-04-14 ENCOUNTER — Telehealth: Payer: Self-pay

## 2019-04-14 NOTE — Telephone Encounter (Signed)
Pt said he has a kidney stone that is larger and wants Dr [name] to give him med for lower rt back pain. I explained Dr [name] is not in office today and pt said he had tried to reach [facility] but can't get them on the phone. I called [facility] and spoke with Mickel Baas a nurse and she said she would be glad to get pt an appt to be seen this afternoon. I conferenced pt in the call and Mickel Baas offered pt an appt this afternoon since he had not been seen there in a year. Pt said no he is not going to make appt and waste his time. Mickel Baas advised pt if changes his mind to cb and will schedule appt. Pt disconnected himself from call and I thanked Mickel Baas for being willing to schedule pt an appt since he said he was hurting. FYI to Dr [name] and Dr Danise Mina. Pt last seen 01/12/19.

## 2019-04-15 NOTE — Telephone Encounter (Signed)
Late entry- noted yesterday afternoon. We tried to set pt up with urology yesterday, he declined further evaluation.

## 2019-04-16 NOTE — Telephone Encounter (Signed)
I was not in clinic on Friday.  I think it was entirely reasonable to try to get him set up with urology and he declined that.  See if he will come in this week so we can evaluate him and potentially get a KUB done.  Thanks.

## 2019-04-17 NOTE — Telephone Encounter (Signed)
Left message for patient to call back  

## 2019-04-18 NOTE — Telephone Encounter (Signed)
Left detailed message on voicemail. DPR 

## 2019-04-24 DIAGNOSIS — E119 Type 2 diabetes mellitus without complications: Secondary | ICD-10-CM | POA: Diagnosis not present

## 2019-04-24 DIAGNOSIS — Z794 Long term (current) use of insulin: Secondary | ICD-10-CM | POA: Diagnosis not present

## 2019-04-24 DIAGNOSIS — I10 Essential (primary) hypertension: Secondary | ICD-10-CM | POA: Diagnosis not present

## 2019-04-24 DIAGNOSIS — E559 Vitamin D deficiency, unspecified: Secondary | ICD-10-CM | POA: Diagnosis not present

## 2019-04-24 DIAGNOSIS — E1165 Type 2 diabetes mellitus with hyperglycemia: Secondary | ICD-10-CM | POA: Diagnosis not present

## 2019-04-24 DIAGNOSIS — E785 Hyperlipidemia, unspecified: Secondary | ICD-10-CM | POA: Diagnosis not present

## 2019-04-24 DIAGNOSIS — Z8639 Personal history of other endocrine, nutritional and metabolic disease: Secondary | ICD-10-CM | POA: Diagnosis not present

## 2019-04-24 DIAGNOSIS — E1169 Type 2 diabetes mellitus with other specified complication: Secondary | ICD-10-CM | POA: Diagnosis not present

## 2019-04-24 DIAGNOSIS — E1159 Type 2 diabetes mellitus with other circulatory complications: Secondary | ICD-10-CM | POA: Diagnosis not present

## 2019-05-01 ENCOUNTER — Other Ambulatory Visit: Payer: Self-pay

## 2019-05-01 ENCOUNTER — Encounter: Payer: Self-pay | Admitting: Family Medicine

## 2019-05-01 ENCOUNTER — Ambulatory Visit (INDEPENDENT_AMBULATORY_CARE_PROVIDER_SITE_OTHER): Payer: Medicare HMO | Admitting: Family Medicine

## 2019-05-01 VITALS — BP 104/62 | HR 68 | Temp 98.2°F | Ht 70.0 in | Wt 212.1 lb

## 2019-05-01 DIAGNOSIS — Z87442 Personal history of urinary calculi: Secondary | ICD-10-CM | POA: Diagnosis not present

## 2019-05-01 DIAGNOSIS — R1310 Dysphagia, unspecified: Secondary | ICD-10-CM

## 2019-05-01 MED ORDER — PANTOPRAZOLE SODIUM 40 MG PO TBEC: 40.0000 mg | DELAYED_RELEASE_TABLET | Freq: Every day | ORAL | 3 refills | Status: DC

## 2019-05-01 MED ORDER — OXYCODONE-ACETAMINOPHEN 5-325 MG PO TABS: 1.0000 | ORAL_TABLET | Freq: Four times a day (QID) | ORAL | 0 refills | Status: DC | PRN

## 2019-05-01 NOTE — Progress Notes (Signed)
Dysphagia.  He had episodes of food sticking mid esophagus and "Ican't get it to go down."  It will usually pass after a few minutes w/o intervention.  Not vomiting.  No blood in stool.  No fever, no chills.  During an episode, he can't take fluids.  No choking.  Noted a few times a week.  Not SOB with the episodes.    He passed a kidney stone yesterday.  He is going to f/u with urology when possible.  He needed a refill on oxycodone to have on hand. No ADE on med.    He had f/u with endocrine in the meantime.   Meds, vitals, and allergies reviewed.   ROS: Per HPI unless specifically indicated in ROS section   GEN: nad, alert and oriented HEENT: ncat NECK: supple w/o LA, no stridor CV: rrr.  PULM: ctab, no inc wob ABD: soft, +bs EXT: no edema SKIN: no acute rash

## 2019-05-01 NOTE — Patient Instructions (Addendum)
We'll call about seeing GI.  Start taking pantoprazole.  Small bites, chew well.  Don't eat in a hurry.  Update me as needed.   Take care.  Glad to see you.

## 2019-05-03 NOTE — Assessment & Plan Note (Signed)
Refer to GI.  Start taking pantoprazole.  Small bites, chew well.  Don't eat in a hurry.  Update me as needed.   He agrees.   Anatomy discussed with patient.

## 2019-05-03 NOTE — Assessment & Plan Note (Signed)
Prescription sent for oxycodone in case he has more pain/another stone.  He only uses this with kidney stones.

## 2019-05-05 ENCOUNTER — Emergency Department
Admission: EM | Admit: 2019-05-05 | Discharge: 2019-05-05 | Disposition: A | Discharge status: Home / Self Care | Payer: Medicare HMO | Attending: Emergency Medicine | Admitting: Emergency Medicine

## 2019-05-05 ENCOUNTER — Encounter: Payer: Self-pay | Admitting: Emergency Medicine

## 2019-05-05 ENCOUNTER — Other Ambulatory Visit: Payer: Self-pay

## 2019-05-05 ENCOUNTER — Telehealth: Payer: Self-pay | Admitting: Emergency Medicine

## 2019-05-05 DIAGNOSIS — Z5321 Procedure and treatment not carried out due to patient leaving prior to being seen by health care provider: Secondary | ICD-10-CM | POA: Diagnosis not present

## 2019-05-05 DIAGNOSIS — R109 Unspecified abdominal pain: Secondary | ICD-10-CM | POA: Insufficient documentation

## 2019-05-05 DIAGNOSIS — N200 Calculus of kidney: Secondary | ICD-10-CM | POA: Diagnosis not present

## 2019-05-05 DIAGNOSIS — N202 Calculus of kidney with calculus of ureter: Secondary | ICD-10-CM | POA: Diagnosis not present

## 2019-05-05 DIAGNOSIS — N132 Hydronephrosis with renal and ureteral calculous obstruction: Secondary | ICD-10-CM | POA: Diagnosis not present

## 2019-05-05 LAB — COMPREHENSIVE METABOLIC PANEL
ALT: 20 U/L (ref 0–44)
AST: 17 U/L (ref 15–41)
Albumin: 4.1 g/dL (ref 3.5–5.0)
Alkaline Phosphatase: 77 U/L (ref 38–126)
Anion gap: 9 (ref 5–15)
BUN: 22 mg/dL — ABNORMAL HIGH (ref 6–20)
CO2: 24 mmol/L (ref 22–32)
Calcium: 9.1 mg/dL (ref 8.9–10.3)
Chloride: 107 mmol/L (ref 98–111)
Creatinine, Ser: 1.98 mg/dL — ABNORMAL HIGH (ref 0.61–1.24)
GFR calc Af Amer: 42 mL/min — ABNORMAL LOW (ref >60)
GFR calc non Af Amer: 36 mL/min — ABNORMAL LOW (ref >60)
Glucose, Bld: 202 mg/dL — ABNORMAL HIGH (ref 70–99)
Potassium: 4.1 mmol/L (ref 3.5–5.1)
Sodium: 140 mmol/L (ref 135–145)
Total Bilirubin: 1.1 mg/dL (ref 0.3–1.2)
Total Protein: 7.8 g/dL (ref 6.5–8.1)

## 2019-05-05 LAB — CBC
HCT: 42.1 % (ref 39.0–52.0)
Hemoglobin: 14.2 g/dL (ref 13.0–17.0)
MCH: 30 pg (ref 26.0–34.0)
MCHC: 33.7 g/dL (ref 30.0–36.0)
MCV: 89 fL (ref 80.0–100.0)
Platelets: 228 10*3/uL (ref 150–400)
RBC: 4.73 MIL/uL (ref 4.22–5.81)
RDW: 12.8 % (ref 11.5–15.5)
WBC: 13.2 10*3/uL — ABNORMAL HIGH (ref 4.0–10.5)
nRBC: 0 % (ref 0.0–0.2)

## 2019-05-05 LAB — URINALYSIS, COMPLETE (UACMP) WITH MICROSCOPIC
Bacteria, UA: NONE SEEN
Bilirubin Urine: NEGATIVE
Glucose, UA: 50 mg/dL — AB
Ketones, ur: 5 mg/dL — AB
Leukocytes,Ua: NEGATIVE
Nitrite: NEGATIVE
Protein, ur: NEGATIVE mg/dL
Specific Gravity, Urine: 1.019 (ref 1.005–1.030)
pH: 5 (ref 5.0–8.0)

## 2019-05-05 NOTE — ED Triage Notes (Addendum)
Pt presents to ED with right lower abd pain that radiates around to his left flank. Pt states he has a hx of kidney stones. Onset of pain was Wednesday; pain worsened today. Denies difficulty urinating. abd tender to touch. `

## 2019-05-05 NOTE — ED Notes (Signed)
Pt called in the [place] with no response, pt is not visualized. Will attempt again in 5 min

## 2019-05-05 NOTE — ED Notes (Signed)
Patient called to be roomed. No answer at this time.

## 2019-05-05 NOTE — ED Notes (Signed)
Pt called in the [place] x3 with no response,.

## 2019-05-05 NOTE — Telephone Encounter (Signed)
Called patient due to lwot to inquire about condition and follow up plans. Left message asking him to have his doctor review his lab results and that he may return any time to the ED.

## 2019-05-07 ENCOUNTER — Telehealth: Payer: Self-pay | Admitting: Family Medicine

## 2019-05-07 DIAGNOSIS — R7989 Other specified abnormal findings of blood chemistry: Secondary | ICD-10-CM

## 2019-05-07 NOTE — Telephone Encounter (Signed)
Please call to get update on patient.  Labs reviewed in EMR.  His white count was elevated and his creatinine function is worse.  He is going to need recheck on both.  He also had blood in his urine.  At minimum needs lab visit, likely needs office visit with labs.

## 2019-05-08 ENCOUNTER — Telehealth: Payer: Self-pay | Admitting: Family Medicine

## 2019-05-08 ENCOUNTER — Other Ambulatory Visit: Payer: Self-pay | Admitting: Urology

## 2019-05-08 DIAGNOSIS — R69 Illness, unspecified: Secondary | ICD-10-CM | POA: Diagnosis not present

## 2019-05-08 NOTE — Telephone Encounter (Signed)
Pt is wondering if he is due for pneumonia shot.

## 2019-05-08 NOTE — Telephone Encounter (Addendum)
Left message on vm for pt to call back.  Need update on pt and to schedule in office with labs.

## 2019-05-09 NOTE — Telephone Encounter (Signed)
Left message for patient to call back-see other message also

## 2019-05-09 NOTE — Telephone Encounter (Signed)
Spoke with patient. Patient is not having any symptoms of concern at this time. Kidney stone possibly broke in pieces and the pain has resolved per patient. Cancelled urology procedure at this time. Appointment for lab is made.

## 2019-05-09 NOTE — Telephone Encounter (Signed)
Up-to-date.  Not due again until he turns 67.

## 2019-05-09 NOTE — Telephone Encounter (Signed)
Patient advised.

## 2019-05-10 ENCOUNTER — Other Ambulatory Visit (HOSPITAL_COMMUNITY)
Admission: RE | Admit: 2019-05-10 | Discharge: 2019-05-10 | Disposition: A | Discharge status: Home / Self Care | Payer: Medicare HMO | Source: Ambulatory Visit | Attending: Urology | Admitting: Urology

## 2019-05-10 ENCOUNTER — Other Ambulatory Visit: Payer: Self-pay | Admitting: Urology

## 2019-05-10 DIAGNOSIS — N201 Calculus of ureter: Secondary | ICD-10-CM | POA: Insufficient documentation

## 2019-05-10 DIAGNOSIS — Z01812 Encounter for preprocedural laboratory examination: Secondary | ICD-10-CM | POA: Diagnosis not present

## 2019-05-10 DIAGNOSIS — Z20828 Contact with and (suspected) exposure to other viral communicable diseases: Secondary | ICD-10-CM | POA: Diagnosis not present

## 2019-05-10 LAB — SARS CORONAVIRUS 2 (TAT 6-24 HRS): SARS Coronavirus 2: NEGATIVE

## 2019-05-10 NOTE — Patient Instructions (Addendum)
DUE TO COVID-19 ONLY ONE VISITOR IS ALLOWED TO COME WITH YOU AND STAY IN THE WAITING ROOM ONLY DURING PRE OP AND PROCEDURE DAY OF SURGERY.  THE 1 VISITOR MAY VISIT WITH YOU AFTER SURGERY IN YOUR PRIVATE ROOM DURING VISITING HOURS ONLY!  YOU NEED TO HAVE A COVID 19 TEST ON__9/1_____ @_______ , THIS TEST MUST BE DONE BEFORE SURGERY, COME  [address] , [id number].  ([place]) ONCE YOUR COVID TEST IS COMPLETED, PLEASE BEGIN THE QUARANTINE INSTRUCTIONS AS OUTLINED IN YOUR HANDOUT.                [place]   Your procedure is scheduled on: Friday 05/12/19   Report to [facility] Main  Entrance Report to admitting at  13:30 AM     Call this number if you have problems the morning of surgery [phone]    Remember: Do not eat food or drink liquids :After Midnight.   BRUSH YOUR TEETH MORNING OF SURGERY AND RINSE YOUR MOUTH OUT, NO CHEWING GUM CANDY OR MINTS.     Take these medicines the morning of surgery with A SIP OF WATER: Coreg, Flomax, Protoix  DO NOT TAKE ANY DIABETIC MEDICATIONS DAY OF YOUR SURGERY                     How to Manage Your Diabetes Before and After Surgery  Why is it important to control my blood sugar before and after surgery? . Improving blood sugar levels before and after surgery helps healing and can limit problems. . A way of improving blood sugar control is eating a healthy diet by: o  Eating less sugar and carbohydrates o  Increasing activity/exercise o  Talking with your doctor about reaching your blood sugar goals . High blood sugars (greater than 180 mg/dL) can raise your risk of infections and slow your recovery, so you will need to focus on controlling your diabetes during the weeks before surgery. . Make sure that the doctor who takes care of your diabetes knows about your planned surgery including the date and location.  How do I manage my blood sugar before surgery? . Check your blood sugar at least  4 times a day, starting 2 days before surgery, to make sure that the level is not too high or low. o Check your blood sugar the morning of your surgery when you wake up and every 2 hours until you get to the Short Stay unit. . If your blood sugar is less than 70 mg/dL, you will need to treat for low blood sugar: o Do not take insulin. o Treat a low blood sugar (less than 70 mg/dL) with  cup of clear juice (cranberry or apple), 4 glucose tablets, OR glucose gel. o Recheck blood sugar in 15 minutes after treatment (to make sure it is greater than 70 mg/dL). If your blood sugar is not greater than 70 mg/dL on recheck, call [phone] for further instructions. . Report your blood sugar to the short stay nurse when you get to Short Stay.  . If you are admitted to the hospital after surgery: o Your blood sugar will be checked by the staff and you will probably be given insulin after surgery (instead of oral diabetes medicines) to make sure you have good blood sugar levels. o The goal for blood sugar control after surgery is 80-180 mg/dL.   WHAT DO I DO ABOUT MY DIABETES MEDICATION?  [name] Kitchen Do not  take oral diabetes medicines (pills) the morning of surgery.  . THE NIGHT BEFORE SURGERY, take  7   units of       insulin.       . THE MORNING OF SURGERY, take  15 units of         Insulin.   . The day of surgery, do not take other diabetes injectables, including Byetta (exenatide), Bydureon (exenatide ER), Victoza (liraglutide), or Trulicity (dulaglutide).  . If your CBG is greater than 220 mg/dL, you may take  of your sliding scale  . (correction) dose of insulin.               You may not have any metal on your body including piercings              Do not wear jewelry, lotions, powders or  deodorant .              Men may shave face and neck.   Do not bring valuables to the hospital. [place].  Contacts, dentures or bridgework may not be worn  into surgery.      Patients discharged the day of surgery will not be allowed to drive home.  IF YOU ARE HAVING SURGERY AND GOING HOME THE SAME DAY, YOU MUST HAVE AN ADULT TO DRIVE YOU HOME AND BE WITH YOU FOR 24 HOURS.  YOU MAY GO HOME BY TAXI OR UBER OR ORTHERWISE, BUT AN ADULT MUST ACCOMPANY YOU HOME AND STAY WITH YOU FOR 24 HOURS.  Name and phone number of your driver:  Special Instructions: N/A              Please read over the following fact sheets you were given: _____________________________________________________________________             [facility] - Preparing for Surgery  Before surgery, you can play an important role.   Because skin is not sterile, your skin needs to be as free of germs as possible.   You can reduce the number of germs on your skin by washing with CHG (chlorahexidine gluconate) soap before surgery.   CHG is an antiseptic cleaner which kills germs and bonds with the skin to continue killing germs even after washing. Please DO NOT use if you have an allergy to CHG or antibacterial soaps.   If your skin becomes reddened/irritated stop using the CHG and inform your nurse when you arrive at Short Stay.  You may shave your face/neck.  Please follow these instructions carefully:  1.  Shower with CHG Soap the night before surgery and the  morning of Surgery.  2.  If you choose to wash your hair, wash your hair first as usual with your  normal  shampoo.  3.  After you shampoo, rinse your hair and body thoroughly to remove the  shampoo.                                        4.  Use CHG as you would any other liquid soap.  You can apply chg directly  to the skin and wash                       Gently with a scrungie or clean washcloth.  5.  Apply the CHG Soap to your body ONLY FROM THE NECK DOWN.   Do not use on face/ open                           Wound or open sores. Avoid contact with eyes, ears mouth and genitals (private parts).                       Wash  face,  Genitals (private parts) with your normal soap.             6.  Wash thoroughly, paying special attention to the area where your surgery  will be performed.  7.  Thoroughly rinse your body with warm water from the neck down.  8.  DO NOT shower/wash with your normal soap after using and rinsing off  the CHG Soap.                9.  Pat yourself dry with a clean towel.            10.  Wear clean pajamas.            11.  Place clean sheets on your bed the night of your first shower and do not  sleep with pets. Day of Surgery : Do not apply any lotions/deodorants the morning of surgery.  Please wear clean clothes to the hospital/surgery center.  FAILURE TO FOLLOW THESE INSTRUCTIONS MAY RESULT IN THE CANCELLATION OF YOUR SURGERY PATIENT SIGNATURE_________________________________  NURSE SIGNATURE__________________________________  ________________________________________________________________________

## 2019-05-10 NOTE — Telephone Encounter (Signed)
Noted. Thanks.

## 2019-05-11 ENCOUNTER — Encounter (HOSPITAL_COMMUNITY)
Admission: RE | Admit: 2019-05-11 | Discharge: 2019-05-11 | Disposition: A | Discharge status: Home / Self Care | Payer: Medicare HMO | Source: Ambulatory Visit | Attending: Urology | Admitting: Urology

## 2019-05-11 ENCOUNTER — Other Ambulatory Visit: Payer: Self-pay

## 2019-05-11 ENCOUNTER — Encounter (HOSPITAL_COMMUNITY): Payer: Self-pay

## 2019-05-11 DIAGNOSIS — E7800 Pure hypercholesterolemia, unspecified: Secondary | ICD-10-CM | POA: Diagnosis not present

## 2019-05-11 DIAGNOSIS — Z7982 Long term (current) use of aspirin: Secondary | ICD-10-CM | POA: Diagnosis not present

## 2019-05-11 DIAGNOSIS — G4733 Obstructive sleep apnea (adult) (pediatric): Secondary | ICD-10-CM | POA: Diagnosis not present

## 2019-05-11 DIAGNOSIS — N202 Calculus of kidney with calculus of ureter: Secondary | ICD-10-CM | POA: Diagnosis not present

## 2019-05-11 DIAGNOSIS — Z6831 Body mass index (BMI) 31.0-31.9, adult: Secondary | ICD-10-CM | POA: Diagnosis not present

## 2019-05-11 DIAGNOSIS — Z981 Arthrodesis status: Secondary | ICD-10-CM | POA: Diagnosis not present

## 2019-05-11 DIAGNOSIS — Z79899 Other long term (current) drug therapy: Secondary | ICD-10-CM | POA: Diagnosis not present

## 2019-05-11 DIAGNOSIS — I252 Old myocardial infarction: Secondary | ICD-10-CM | POA: Diagnosis not present

## 2019-05-11 DIAGNOSIS — Z8249 Family history of ischemic heart disease and other diseases of the circulatory system: Secondary | ICD-10-CM | POA: Diagnosis not present

## 2019-05-11 DIAGNOSIS — M1990 Unspecified osteoarthritis, unspecified site: Secondary | ICD-10-CM | POA: Diagnosis not present

## 2019-05-11 DIAGNOSIS — G8929 Other chronic pain: Secondary | ICD-10-CM | POA: Diagnosis not present

## 2019-05-11 DIAGNOSIS — Z888 Allergy status to other drugs, medicaments and biological substances status: Secondary | ICD-10-CM | POA: Diagnosis not present

## 2019-05-11 DIAGNOSIS — I4891 Unspecified atrial fibrillation: Secondary | ICD-10-CM | POA: Diagnosis not present

## 2019-05-11 DIAGNOSIS — Z833 Family history of diabetes mellitus: Secondary | ICD-10-CM | POA: Diagnosis not present

## 2019-05-11 DIAGNOSIS — Z7902 Long term (current) use of antithrombotics/antiplatelets: Secondary | ICD-10-CM | POA: Diagnosis not present

## 2019-05-11 DIAGNOSIS — E119 Type 2 diabetes mellitus without complications: Secondary | ICD-10-CM | POA: Diagnosis not present

## 2019-05-11 DIAGNOSIS — K219 Gastro-esophageal reflux disease without esophagitis: Secondary | ICD-10-CM | POA: Diagnosis not present

## 2019-05-11 DIAGNOSIS — E669 Obesity, unspecified: Secondary | ICD-10-CM | POA: Diagnosis not present

## 2019-05-11 DIAGNOSIS — N23 Unspecified renal colic: Secondary | ICD-10-CM | POA: Diagnosis not present

## 2019-05-11 DIAGNOSIS — I119 Hypertensive heart disease without heart failure: Secondary | ICD-10-CM | POA: Diagnosis not present

## 2019-05-11 DIAGNOSIS — Z955 Presence of coronary angioplasty implant and graft: Secondary | ICD-10-CM | POA: Diagnosis not present

## 2019-05-11 DIAGNOSIS — I2510 Atherosclerotic heart disease of native coronary artery without angina pectoris: Secondary | ICD-10-CM | POA: Diagnosis not present

## 2019-05-11 DIAGNOSIS — Z794 Long term (current) use of insulin: Secondary | ICD-10-CM | POA: Diagnosis not present

## 2019-05-11 LAB — GLUCOSE, CAPILLARY: Glucose-Capillary: 156 mg/dL — ABNORMAL HIGH (ref 70–99)

## 2019-05-11 LAB — HEMOGLOBIN A1C
Hgb A1c MFr Bld: 7.9 % — ABNORMAL HIGH (ref 4.8–5.6)
Mean Plasma Glucose: 180.03 mg/dL

## 2019-05-11 NOTE — Progress Notes (Signed)
PCP - Dr. Orlinda Blalock Cardiologist - Dr Einar Gip  Chest x-ray - no EKG - 02/09/19 Stress Test - no ECHO - 02/01/18 Cardiac Cath - 10/16/16  Sleep Study - no CPAP - no  Fasting Blood Sugar - 156 Checks Blood Sugar every other day_____ times a day  Blood Thinner Instructions:Plavix and ASA Aspirin Instructions:Dr. Bell's office Last Dose:05/10/19  Anesthesia review:   Patient denies shortness of breath, fever, cough and chest pain at PAT appointment yes  Patient verbalized understanding of instructions that were given to them at the PAT appointment. Patient was also instructed that they will need to review over the PAT instructions again at home before surgery. yes

## 2019-05-12 ENCOUNTER — Other Ambulatory Visit: Payer: Self-pay

## 2019-05-12 ENCOUNTER — Encounter: Payer: Self-pay | Admitting: *Deleted

## 2019-05-12 ENCOUNTER — Ambulatory Visit (HOSPITAL_COMMUNITY): Admission: RE | Admit: 2019-05-12 | Payer: Medicare HMO | Source: Home / Self Care | Admitting: Urology

## 2019-05-12 ENCOUNTER — Encounter (HOSPITAL_COMMUNITY)
Admission: RE | Disposition: A | Discharge status: Home / Self Care | Payer: Self-pay | Source: Home / Self Care | Attending: Urology

## 2019-05-12 ENCOUNTER — Encounter (HOSPITAL_COMMUNITY): Admission: RE | Payer: Self-pay | Source: Home / Self Care

## 2019-05-12 ENCOUNTER — Ambulatory Visit (HOSPITAL_COMMUNITY): Payer: Medicare HMO

## 2019-05-12 ENCOUNTER — Ambulatory Visit (HOSPITAL_COMMUNITY)
Admission: RE | Admit: 2019-05-12 | Discharge: 2019-05-12 | Disposition: A | Discharge status: Home / Self Care | Payer: Medicare HMO | Attending: Urology | Admitting: Urology

## 2019-05-12 ENCOUNTER — Encounter (HOSPITAL_COMMUNITY): Payer: Self-pay | Admitting: *Deleted

## 2019-05-12 ENCOUNTER — Ambulatory Visit (HOSPITAL_COMMUNITY): Payer: Medicare HMO | Admitting: Anesthesiology

## 2019-05-12 DIAGNOSIS — Z955 Presence of coronary angioplasty implant and graft: Secondary | ICD-10-CM | POA: Insufficient documentation

## 2019-05-12 DIAGNOSIS — N202 Calculus of kidney with calculus of ureter: Secondary | ICD-10-CM | POA: Insufficient documentation

## 2019-05-12 DIAGNOSIS — E669 Obesity, unspecified: Secondary | ICD-10-CM | POA: Insufficient documentation

## 2019-05-12 DIAGNOSIS — I119 Hypertensive heart disease without heart failure: Secondary | ICD-10-CM | POA: Insufficient documentation

## 2019-05-12 DIAGNOSIS — Z833 Family history of diabetes mellitus: Secondary | ICD-10-CM | POA: Insufficient documentation

## 2019-05-12 DIAGNOSIS — K219 Gastro-esophageal reflux disease without esophagitis: Secondary | ICD-10-CM | POA: Insufficient documentation

## 2019-05-12 DIAGNOSIS — M1990 Unspecified osteoarthritis, unspecified site: Secondary | ICD-10-CM | POA: Diagnosis not present

## 2019-05-12 DIAGNOSIS — E119 Type 2 diabetes mellitus without complications: Secondary | ICD-10-CM | POA: Diagnosis not present

## 2019-05-12 DIAGNOSIS — R69 Illness, unspecified: Secondary | ICD-10-CM | POA: Diagnosis not present

## 2019-05-12 DIAGNOSIS — G4730 Sleep apnea, unspecified: Secondary | ICD-10-CM | POA: Diagnosis not present

## 2019-05-12 DIAGNOSIS — I252 Old myocardial infarction: Secondary | ICD-10-CM | POA: Insufficient documentation

## 2019-05-12 DIAGNOSIS — Z79899 Other long term (current) drug therapy: Secondary | ICD-10-CM | POA: Insufficient documentation

## 2019-05-12 DIAGNOSIS — Z8249 Family history of ischemic heart disease and other diseases of the circulatory system: Secondary | ICD-10-CM | POA: Insufficient documentation

## 2019-05-12 DIAGNOSIS — Z6831 Body mass index (BMI) 31.0-31.9, adult: Secondary | ICD-10-CM | POA: Insufficient documentation

## 2019-05-12 DIAGNOSIS — Z7982 Long term (current) use of aspirin: Secondary | ICD-10-CM | POA: Insufficient documentation

## 2019-05-12 DIAGNOSIS — G4733 Obstructive sleep apnea (adult) (pediatric): Secondary | ICD-10-CM | POA: Insufficient documentation

## 2019-05-12 DIAGNOSIS — Z888 Allergy status to other drugs, medicaments and biological substances status: Secondary | ICD-10-CM | POA: Insufficient documentation

## 2019-05-12 DIAGNOSIS — I2510 Atherosclerotic heart disease of native coronary artery without angina pectoris: Secondary | ICD-10-CM | POA: Insufficient documentation

## 2019-05-12 DIAGNOSIS — Z794 Long term (current) use of insulin: Secondary | ICD-10-CM | POA: Insufficient documentation

## 2019-05-12 DIAGNOSIS — Z981 Arthrodesis status: Secondary | ICD-10-CM | POA: Insufficient documentation

## 2019-05-12 DIAGNOSIS — N23 Unspecified renal colic: Secondary | ICD-10-CM | POA: Diagnosis not present

## 2019-05-12 DIAGNOSIS — N201 Calculus of ureter: Secondary | ICD-10-CM | POA: Diagnosis not present

## 2019-05-12 DIAGNOSIS — G8929 Other chronic pain: Secondary | ICD-10-CM | POA: Diagnosis not present

## 2019-05-12 DIAGNOSIS — Z7902 Long term (current) use of antithrombotics/antiplatelets: Secondary | ICD-10-CM | POA: Insufficient documentation

## 2019-05-12 DIAGNOSIS — E7800 Pure hypercholesterolemia, unspecified: Secondary | ICD-10-CM | POA: Insufficient documentation

## 2019-05-12 DIAGNOSIS — I4891 Unspecified atrial fibrillation: Secondary | ICD-10-CM | POA: Insufficient documentation

## 2019-05-12 DIAGNOSIS — I10 Essential (primary) hypertension: Secondary | ICD-10-CM | POA: Diagnosis not present

## 2019-05-12 HISTORY — PX: CYSTOSCOPY WITH RETROGRADE PYELOGRAM, URETEROSCOPY AND STENT PLACEMENT: SHX5789

## 2019-05-12 LAB — GLUCOSE, CAPILLARY
Glucose-Capillary: 94 mg/dL (ref 70–99)
Glucose-Capillary: 84 mg/dL (ref 70–99)

## 2019-05-12 SURGERY — CYSTOSCOPY/URETEROSCOPY/HOLMIUM LASER/STENT PLACEMENT
Anesthesia: Choice | Laterality: Right

## 2019-05-12 SURGERY — CYSTOURETEROSCOPY, WITH RETROGRADE PYELOGRAM AND STENT INSERTION
Anesthesia: General | Site: Ureter | Laterality: Right

## 2019-05-12 MED ORDER — LIDOCAINE 2% (20 MG/ML) 5 ML SYRINGE
INTRAMUSCULAR | Status: DC | PRN
  Administered 2019-05-12: 80 mg via INTRAVENOUS

## 2019-05-12 MED ORDER — HYDROCODONE-ACETAMINOPHEN 5-325 MG PO TABS: 1.0000 | ORAL_TABLET | ORAL | 0 refills | Status: DC | PRN

## 2019-05-12 MED ORDER — OXYCODONE HCL 5 MG PO TABS: 5.0000 mg | ORAL_TABLET | Freq: Once | ORAL | Status: DC | PRN

## 2019-05-12 MED ORDER — OXYCODONE HCL 5 MG/5ML PO SOLN: 5.0000 mg | Freq: Once | ORAL | Status: DC | PRN

## 2019-05-12 MED ORDER — FENTANYL CITRATE (PF) 100 MCG/2ML IJ SOLN
INTRAMUSCULAR | Status: DC | PRN
  Administered 2019-05-12: 50 ug via INTRAVENOUS

## 2019-05-12 MED ORDER — FENTANYL CITRATE (PF) 100 MCG/2ML IJ SOLN: 25.0000 ug | INTRAMUSCULAR | Status: DC | PRN

## 2019-05-12 MED ORDER — PROMETHAZINE HCL 25 MG/ML IJ SOLN: 6.2500 mg | INTRAMUSCULAR | Status: DC | PRN

## 2019-05-12 MED ORDER — ONDANSETRON HCL 4 MG/2ML IJ SOLN
INTRAMUSCULAR | Status: DC | PRN
  Administered 2019-05-12: 4 mg via INTRAVENOUS

## 2019-05-12 MED ORDER — CEFAZOLIN SODIUM-DEXTROSE 2-4 GM/100ML-% IV SOLN
2.0000 g | INTRAVENOUS | Status: AC
  Administered 2019-05-12: 14:00:00 2 g via INTRAVENOUS
  Filled 2019-05-12: qty 100

## 2019-05-12 MED ORDER — LACTATED RINGERS IV SOLN
INTRAVENOUS | Status: DC
  Administered 2019-05-12 (×2): via INTRAVENOUS

## 2019-05-12 MED ORDER — 0.9 % SODIUM CHLORIDE (POUR BTL) OPTIME
TOPICAL | Status: DC | PRN
  Administered 2019-05-12: 1000 mL

## 2019-05-12 MED ORDER — DEXAMETHASONE SODIUM PHOSPHATE 10 MG/ML IJ SOLN
INTRAMUSCULAR | Status: DC | PRN
  Administered 2019-05-12: 5 mg via INTRAVENOUS

## 2019-05-12 MED ORDER — MIDAZOLAM HCL 5 MG/5ML IJ SOLN
INTRAMUSCULAR | Status: DC | PRN
  Administered 2019-05-12: 2 mg via INTRAVENOUS

## 2019-05-12 MED ORDER — PROPOFOL 10 MG/ML IV BOLUS
INTRAVENOUS | Status: DC | PRN
  Administered 2019-05-12: 150 mg via INTRAVENOUS

## 2019-05-12 SURGICAL SUPPLY — 22 items
BAG URO CATCHER STRL LF (MISCELLANEOUS) ×2 IMPLANT
BASKET LASER NITINOL 1.9FR (BASKET) IMPLANT
BASKET ZERO TIP NITINOL 2.4FR (BASKET) IMPLANT
BSKT STON RTRVL 120 1.9FR (BASKET)
BSKT STON RTRVL ZERO TP 2.4FR (BASKET)
CATH INTERMIT  6FR 70CM (CATHETERS) ×1 IMPLANT
CLOTH BEACON ORANGE TIMEOUT ST (SAFETY) ×2 IMPLANT
COVER WAND RF STERILE (DRAPES) IMPLANT
EXTRACTOR STONE 1.7FRX115CM (UROLOGICAL SUPPLIES) IMPLANT
FIBER LASER FLEXIVA 365 (UROLOGICAL SUPPLIES) IMPLANT
FIBER LASER TRAC TIP (UROLOGICAL SUPPLIES) ×1 IMPLANT
GLOVE BIO SURGEON STRL SZ7.5 (GLOVE) ×2 IMPLANT
GOWN STRL REUS W/TWL XL LVL3 (GOWN DISPOSABLE) ×2 IMPLANT
GUIDEWIRE ANG ZIPWIRE 038X150 (WIRE) IMPLANT
GUIDEWIRE STR DUAL SENSOR (WIRE) ×2 IMPLANT
KIT TURNOVER KIT A (KITS) IMPLANT
MANIFOLD NEPTUNE II (INSTRUMENTS) ×2 IMPLANT
PACK CYSTO (CUSTOM PROCEDURE TRAY) ×2 IMPLANT
SHEATH URETERAL 12FRX28CM (UROLOGICAL SUPPLIES) IMPLANT
SHEATH URETERAL 12FRX35CM (MISCELLANEOUS) IMPLANT
STENT URET 6FRX24 CONTOUR (STENTS) ×1 IMPLANT
TUBING UROLOGY SET (TUBING) ×2 IMPLANT

## 2019-05-12 NOTE — H&P (Signed)
CC/HPI: 1 - Recurrent Nephrolithiasis -  s/p many medical passage and SWL episodes.   08/2017 - CT punctate Rt renal stones only (too small to measure)  04/2018 - CT fusiform left proximal 53m stone (L3 TP), 300HU. NOT easily seen on scout images + punctuate left mid, mild hyro.   2 - Medical Stone Disease / Oliguria / Hypocitraturia - Eval 2018 with low volume, low citrate. Composition 100% CaOx. Could not afford K-Cit.   3 - Left Flank-Groin Pain - 1 day of colicky left groin / flank pain, feel similar to prior stones. NO fevers. CT today withy fusiform left proximal stone. (04/2018)   PMH sig for CAD/MI/Plavix, Obesity, DM2, Chronic Pain, OSA/CPAP, neck fusion, orhto surgeyr x many.   August 2019: "Chirs' is seen as work in to r/o ureteral stone.   05/05/2019: Pt did not f/u after trial of MET after last OV. He walks in today requesting an appointment for evaluation of possible KS. Symptoms began 2 days ago with severe right lower back pain radiating into the flank and lower abdomen. Associated with nausea and increased urinary frequency. Denies fevers. He passed a small stone at the beginning of the week without significant issue. Patient continues tamsulosin at baseline. He went to the emergency department this morning for pain management and evaluation but left without being seen. He's been taking Percocet without any significant relief of symptoms.     ALLERGIES: Ambien lisinopril Tramadol    MEDICATIONS: Metformin Hcl 1,000 mg tablet  Percocet 5 mg-325 mg tablet 1-2 tablet PO Q6PRN Pain  Tamsulosin Hcl 0.4 mg capsule  Aspirin Ec 81 mg tablet, delayed release  Atorvastatin Calcium 40 mg tablet  Carvedilol 6.25 mg tablet  Clopidogrel 75 mg tablet  Glimepiride 4 mg tablet  Levemir  Losartan Potassium 100 mg tablet  Zantac 75 75 mg tablet     GU PSH: None     PSH Notes: lung biopsy   Hand surgery   Elbow surgery   NON-GU PSH: Cardiac Stent Placement Knee Arthroscopy Neck  Surgery (Unspecified) Shoulder Surgery (Unspecified)         GU PMH: Hypocitraturia (Improving, Chronic) - 04/19/2018 Renal calculus - 09/02/2017, - 07/08/2017      PMH Notes:  1898-09-07 00:00:00 - Note: Normal Routine History And Physical Adult   NON-GU PMH: Oliguria (Improving, Chronic) - 04/19/2018 Arrhythmia Arthritis Atrial Fibrillation Cardiac murmur, unspecified Depression Diabetes Type 2 GERD Heart disease, unspecified Hypercholesterolemia Hypertension Myocardial Infarction Skin Cancer, History Sleep Apnea    FAMILY HISTORY: cardiac disorder - Father, Mother Death of family member - Mother Diabetes - Father Hematuria - Father Kidney Stones - Father pneumonia - Mother    Notes: 1 son; 1 daughter   SOCIAL HISTORY: Marital Status: Married Preferred Language: English; Ethnicity: Not Hispanic Or Latino; Race: White Current Smoking Status: Patient has never smoked.  <DIV'  Tobacco Use Assessment Completed:  Used Tobacco in last 30 days?   Does not use smokeless tobacco. Has never drank.  Drinks 1 caffeinated drink per day. 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    REVIEW OF SYSTEMS:     GU Review Male:  Patient denies frequent urination, hard to postpone urination, burning/ pain with urination, get up at night to urinate, leakage of urine, stream starts and stops, trouble starting your stream, have to strain to urinate , erection problems, and penile pain.    Gastrointestinal (Upper):  Patient denies nausea, vomiting, and indigestion/ heartburn.    Gastrointestinal (Lower):  Patient denies  diarrhea and constipation.    Constitutional:  Patient denies fever, night sweats, weight loss, and fatigue.    Skin:  Patient denies skin rash/ lesion and itching.    Eyes:  Patient denies blurred vision and double vision.    Ears/ Nose/ Throat:  Patient denies sore throat and sinus problems.    Hematologic/Lymphatic:  Patient denies swollen glands and easy bruising.     Cardiovascular:  Patient denies leg swelling and chest pains.    Respiratory:  Patient denies cough and shortness of breath.    Endocrine:  Patient denies excessive thirst.    Musculoskeletal:  Patient denies back pain and joint pain.    Neurological:  Patient denies headaches and dizziness.    Psychologic:  Patient denies depression and anxiety.    VITAL SIGNS:       05/05/2019 02:42 PM     Weight 212 lb / 96.16 kg     Height 70 in / 177.8 cm     BP 173/105 mmHg     Pulse 80 /min     Temperature 97.5 F / 36.3 C     BMI 30.4 kg/m     MULTI-SYSTEM PHYSICAL EXAMINATION:      Constitutional: Well-nourished. No physical deformities. Normally developed. Good grooming.     Neck: Neck symmetrical, not swollen. Normal tracheal position.     Respiratory: No labored breathing, no use of accessory muscles.      Cardiovascular: Normal temperature, normal extremity pulses, no swelling, no varicosities.     Skin: No paleness, no jaundice, no cyanosis. No lesion, no ulcer, no rash.     Neurologic / Psychiatric: Oriented to time, oriented to place, oriented to person. No depression, no anxiety, no agitation.     Gastrointestinal: No mass, no tenderness, no rigidity, moderate truncal obesity. Right CVA tenderness.     Musculoskeletal: Normal gait and station of head and neck.            PAST DATA REVIEWED:   Source Of History:  Patient, Medical Record Summary  Lab Test Review:  Stone Analysis  Records Review:  Previous Hospital Records, Previous Patient Records  Urine Test Review:  Urinalysis, 24 Hour Urine  X-Ray Review: C.T. Stone Protocol: Reviewed Films. Discussed With Patient.     PROCEDURES:    C.T. Urogram - [id number]      Patient confirmed No Neulasta OnPro Device.      Urinalysis w/Scope  Dipstick Dipstick Cont'd Micro  Color: Yellow Bilirubin: Neg mg/dL WBC/hpf: 0 - 5/hpf  Appearance: Clear Ketones: Trace mg/dL RBC/hpf: 20 - 40/hpf  Specific Gravity: 1.025 Blood: 3+ ery/uL  Bacteria: NS (Not Seen)  pH: <=5.0 Protein: 1+ mg/dL Cystals: NS (Not Seen)  Glucose: 1+ mg/dL Urobilinogen: 0.2 mg/dL Casts: Granular   Nitrites: Neg Trichomonas: Not Present   Leukocyte Esterase: Neg leu/uL Mucous: Not Present    Epithelial Cells: NS (Not Seen)    Yeast: NS (Not Seen)    Sperm: Not Present    Ketoralac '60mg'$  - [id number], [id number]  Skin was prepped with alcohol. Site was injected. Pt tolerated well.   Qty: 60 Adm. By: Pricilla Riffle  Unit: mg Lot No [id number]  Route: IM Exp. Date 09/28/2018  Freq: None Mfgr.:   Site: Left Hip  Phenergan '25mg'$  - [id number], [id number]  Qty: 25 Adm. By: Pricilla Riffle  Unit: mg Lot No [id number]  Route: IM Exp. Date 09/08/2019  Freq: None Mfgr.:   Site: Right Hip   ASSESSMENT:  ICD-10 Details  1 GU:  Renal calculus - N20.0   2  Ureteral calculus - N20.1 Right  3  Renal colic - M03    PLAN:   Medications  New Meds: Ketorolac Tromethamine 10 mg tablet 1 tablet PO Q 8 H PRN For severe pain #15 0 Refill(s)  Ondansetron Hcl 4 mg tablet 1 tablet PO Q 6 H PRN #20 0 Refill(s)  Oxycodone-Acetaminophen 5 mg-325 mg tablet 1-2 tablet PO Q 6 H PRN #20 0 Refill(s)    Orders  Labs Urine Culture  X-Rays: C.T. Stone Protocol Without Contrast  X-Ray Notes: History:  Hematuria: Yes/No  Patient to see MD after exam: Yes/No  Previous exam: CT / IVP/ US/ KUB/ None  When:  Where:  Diabetic: Yes/ No  BUN/ Creatinine:  Date of last BUN Creatinine:  Weight in pounds:  Allergy- IV Contrast: Yes/ No  Conflicting diabetic meds: Yes/ No  Diabetic Meds:  Prior Authorization #: Per Pieter Partridge clinical review nurse w/[employer] Josem Kaufmann as follows: [id number]  Schedule  Return Visit/Planned Activity: Next Available Appointment - Schedule Surgery  Procedure: 05/05/2019 at [facility] - [phone] - Ketoralac '60mg'$  (Toradol Per 15 Mg) - [id number], [phone]  Procedure: 05/05/2019 at [facility] - [phone] - Phenergan '25mg'$  (Phenergan  Per 50 Mg) - [id number], [id number]  Document  Letter(s):  Created for Patient: Clinical Summary   Notes:  Obstructing right proximal ureteral calculi noted on imaging today. HU 332, likely not a good candidate for ESWL due to density and inability to see on KUB. I'll discuss with Dr Gloriann Loan but pt will likely need URS to treat the obstructing calculus.   For ureteroscopy I described the risks which include heart attack, stroke, pulmonary embolus, death, bleeding, infection, damage to contiguous structures, positioning injury, ureteral stricture, ureteral avulsion, ureteral injury, need for ureteral stent, inability to perform ureteroscopy, need for an interval procedure, inability to clear stone burden, stent discomfort and pain.   I'll administer Toradol for acute pain management now as well send in the oral preperation and refill his pain medication. Phenergan administered today as well for his nausea. I'll send in some oral nausea medication as well. He will continue tamsulosin. ED f/u instructions discussed for worsening symptoms over the weekend. Urine c/s sent.  He'll be contacted to schedule once I complete my discussion with Dr Gloriann Loan. Pt will likely need cardiac clearance to d/c plavix for his pending procedure.   Signed by Jiles Crocker, NP on 05/05/19 at 4:00 PM (EDT

## 2019-05-12 NOTE — Anesthesia Preprocedure Evaluation (Addendum)
Anesthesia Evaluation  Patient identified by MRN, date of birth, ID band Patient awake    Reviewed: Allergy & Precautions, NPO status , Patient's Chart, lab work & pertinent test results, reviewed documented beta blocker date and time   History of Anesthesia Complications Negative for: history of anesthetic complications  Airway Mallampati: II  TM Distance: >3 FB Neck ROM: Full    Dental  (+) Dental Advisory Given, Missing   Pulmonary sleep apnea and Continuous Positive Airway Pressure Ventilation ,    Pulmonary exam normal        Cardiovascular hypertension, Pt. on home beta blockers and Pt. on medications (-) angina+ Past MI and + Cardiac Stents  Normal cardiovascular exam+ dysrhythmias    '19 TTE - EF 55%. Trace MR, TR, and PR.    Neuro/Psych  Headaches, PSYCHIATRIC DISORDERS Depression    GI/Hepatic Neg liver ROS, GERD  Medicated and Controlled,  Endo/Other  diabetes, Type 2, Insulin Dependent, Oral Hypoglycemic Agents  Renal/GU Renal InsufficiencyRenal disease Kidney stones     ED     Musculoskeletal  (+) Arthritis ,   Abdominal   Peds  Hematology negative hematology ROS (+)   Anesthesia Other Findings Sarcoidosis   Reproductive/Obstetrics                            Anesthesia Physical Anesthesia Plan  ASA: III  Anesthesia Plan: General   Post-op Pain Management:    Induction: Intravenous  PONV Risk Score and Plan: 2 and Treatment may vary due to age or medical condition, Ondansetron and Dexamethasone  Airway Management Planned: LMA  Additional Equipment: None  Intra-op Plan:   Post-operative Plan: Extubation in OR  Informed Consent: I have reviewed the patients History and Physical, chart, labs and discussed the procedure including the risks, benefits and alternatives for the proposed anesthesia with the patient or authorized representative who has indicated  his/her understanding and acceptance.     Dental advisory given  Plan Discussed with: CRNA and Anesthesiologist  Anesthesia Plan Comments:        Anesthesia Quick Evaluation

## 2019-05-12 NOTE — Op Note (Signed)
Operative Note  Preoperative diagnosis:  1.  Right ureteral calculus  Postoperative diagnosis: 1.  Right ureteral calculus  Procedure(s): 1.  Cystoscopy with right ureteroscopy, laser lithotripsy, ureteral stent placement  Surgeon: Link Snuffer, MD  Assistants: None  Anesthesia: General  Complications: None immediate  EBL: Minimal  Specimens: 1.  None  Drains/Catheters: 1.  6 x 26 double-J ureteral stent  Intraoperative findings: 1.  Normal urethra 2.  Normal bladder mucosa 3.  Right ureteroscopy revealed an approximately 1 cm calculus fragmented to tiny fragments  Indication: 58 year old male with a right ureteral calculus failed trial passage.  He presents for the previously mentioned operation.  Description of procedure:  The patient was identified and consent was obtained.  The patient was taken to the operating room and placed in the supine position.  The patient was placed under general anesthesia.  Perioperative antibiotics were administered.  The patient was placed in dorsal lithotomy.  Patient was prepped and draped in a standard sterile fashion and a timeout was performed.  A 21 French rigid cystoscope was advanced into the urethra and into the bladder.  Complete cystoscopy was performed with no abnormal findings.  The right ureter was cannulated with a sensor wire which was advanced up to the kidney under fluoroscopic guidance.  A semirigid ureteroscope was advanced alongside the wire up to the stone of interest which was fragmented to tiny fragments.  I advanced the scope up to the renal pelvis and no other ureteral calculi were seen.  I then withdrew the scope carefully visualizing the entire ureter upon removal.  No significant calculi were seen.  No significant injury to the ureter was seen though there was some periureteral inflammation from where the stone was impacted.  I backloaded the wire onto a rigid cystoscope and advanced that into the bladder followed by  routine placement of a 6 x 26 double-J ureteral stent.  Fluoroscopy confirmed proximal placement and direct visualization confirmed a good coil in the bladder.  I drained the bladder withdrew the scope.  Patient tolerated procedure well and was stable postoperative.  Plan:- Follow-up in 1 week for stent removal

## 2019-05-12 NOTE — Anesthesia Postprocedure Evaluation (Signed)
Anesthesia Post Note  Patient: Tony Manning  Procedure(s) Performed: CYSTOSCOPY WITH RIGHT RETROGRADE PYELOGRAM, URETEROSCOPY HOLMIUM LASER AND STENT PLACEMENT (Right Ureter)     Patient location during evaluation: PACU Anesthesia Type: General Level of consciousness: awake and alert Pain management: pain level controlled Vital Signs Assessment: post-procedure vital signs reviewed and stable Respiratory status: spontaneous breathing, nonlabored ventilation and respiratory function stable Cardiovascular status: blood pressure returned to baseline and stable Postop Assessment: no apparent nausea or vomiting Anesthetic complications: no    Last Vitals:  Vitals:   05/12/19 1500 05/12/19 1510  BP: (!) 142/73 (!) 147/75  Pulse: (!) 57 60  Resp: 12 15  Temp:  36.8 C  SpO2: 96% 98%    Last Pain:  Vitals:   05/12/19 1510  TempSrc:   PainSc: 0-No pain                 Audry Pili

## 2019-05-12 NOTE — Discharge Instructions (Signed)

## 2019-05-12 NOTE — Transfer of Care (Signed)
Immediate Anesthesia Transfer of Care Note  Patient: Tony Manning  Procedure(s) Performed: CYSTOSCOPY WITH RIGHT RETROGRADE PYELOGRAM, URETEROSCOPY HOLMIUM LASER AND STENT PLACEMENT (Right )  Patient Location: PACU  Anesthesia Type:General  Level of Consciousness: awake, alert  and oriented  Airway & Oxygen Therapy: Patient Spontanous Breathing and Patient connected to face mask oxygen  Post-op Assessment: Report given to RN  Post vital signs: Reviewed and stable  Last Vitals:  Vitals Value Taken Time  BP 142/80 05/12/19 1438  Temp    Pulse 65 05/12/19 1439  Resp 11 05/12/19 1439  SpO2 100 % 05/12/19 1439  Vitals shown include unvalidated device data.  Last Pain:  Vitals:   05/12/19 1229  TempSrc: Oral  PainSc: 4       Patients Stated Pain Goal: 5 ([id number])  Complications: No apparent anesthesia complications

## 2019-05-12 NOTE — Anesthesia Procedure Notes (Signed)
Procedure Name: LMA Insertion Performed by: Versa Craton J, CRNA Pre-anesthesia Checklist: Patient identified, Emergency Drugs available, Suction available, Patient being monitored and Timeout performed Patient Re-evaluated:Patient Re-evaluated prior to induction Oxygen Delivery Method: Circle system utilized Preoxygenation: Pre-oxygenation with 100% oxygen Induction Type: IV induction Ventilation: Mask ventilation without difficulty LMA: LMA inserted LMA Size: 4.0 Number of attempts: 1 Placement Confirmation: positive ETCO2 and breath sounds checked- equal and bilateral Tube secured with: Tape Dental Injury: Teeth and Oropharynx as per pre-operative assessment        

## 2019-05-13 ENCOUNTER — Encounter (HOSPITAL_COMMUNITY): Payer: Self-pay | Admitting: Urology

## 2019-05-18 ENCOUNTER — Other Ambulatory Visit (INDEPENDENT_AMBULATORY_CARE_PROVIDER_SITE_OTHER): Payer: Medicare HMO

## 2019-05-18 DIAGNOSIS — R7989 Other specified abnormal findings of blood chemistry: Secondary | ICD-10-CM | POA: Diagnosis not present

## 2019-05-18 LAB — CBC WITH DIFFERENTIAL/PLATELET
Basophils Absolute: 0 10*3/uL (ref 0.0–0.1)
Basophils Relative: 0.4 % (ref 0.0–3.0)
Eosinophils Absolute: 0.3 10*3/uL (ref 0.0–0.7)
Eosinophils Relative: 3.8 % (ref 0.0–5.0)
HCT: 38.3 % — ABNORMAL LOW (ref 39.0–52.0)
Hemoglobin: 12.8 g/dL — ABNORMAL LOW (ref 13.0–17.0)
Lymphocytes Relative: 20.7 % (ref 12.0–46.0)
Lymphs Abs: 1.7 10*3/uL (ref 0.7–4.0)
MCHC: 33.4 g/dL (ref 30.0–36.0)
MCV: 90.3 fl (ref 78.0–100.0)
Monocytes Absolute: 0.6 10*3/uL (ref 0.1–1.0)
Monocytes Relative: 6.8 % (ref 3.0–12.0)
Neutro Abs: 5.8 10*3/uL (ref 1.4–7.7)
Neutrophils Relative %: 68.3 % (ref 43.0–77.0)
Platelets: 286 10*3/uL (ref 150.0–400.0)
RBC: 4.24 Mil/uL (ref 4.22–5.81)
RDW: 13.3 % (ref 11.5–15.5)
WBC: 8.4 10*3/uL (ref 4.0–10.5)

## 2019-05-18 LAB — BASIC METABOLIC PANEL
BUN: 29 mg/dL — ABNORMAL HIGH (ref 6–23)
CO2: 26 mEq/L (ref 19–32)
Calcium: 9.6 mg/dL (ref 8.4–10.5)
Chloride: 108 mEq/L (ref 96–112)
Creatinine, Ser: 1.66 mg/dL — ABNORMAL HIGH (ref 0.40–1.50)
GFR: 42.58 mL/min — ABNORMAL LOW (ref >60.00)
Glucose, Bld: 203 mg/dL — ABNORMAL HIGH (ref 70–99)
Potassium: 4.7 mEq/L (ref 3.5–5.1)
Sodium: 140 mEq/L (ref 135–145)

## 2019-05-19 DIAGNOSIS — N201 Calculus of ureter: Secondary | ICD-10-CM | POA: Diagnosis not present

## 2019-05-20 ENCOUNTER — Other Ambulatory Visit: Payer: Self-pay | Admitting: Family Medicine

## 2019-05-23 DIAGNOSIS — N200 Calculus of kidney: Secondary | ICD-10-CM | POA: Diagnosis not present

## 2019-05-29 ENCOUNTER — Other Ambulatory Visit: Payer: Self-pay

## 2019-05-29 ENCOUNTER — Ambulatory Visit (INDEPENDENT_AMBULATORY_CARE_PROVIDER_SITE_OTHER): Payer: Medicare HMO | Admitting: Family Medicine

## 2019-05-29 ENCOUNTER — Encounter: Payer: Self-pay | Admitting: Family Medicine

## 2019-05-29 VITALS — BP 128/68 | HR 76 | Temp 98.0°F | Ht 70.0 in | Wt 213.3 lb

## 2019-05-29 DIAGNOSIS — E1149 Type 2 diabetes mellitus with other diabetic neurological complication: Secondary | ICD-10-CM

## 2019-05-29 DIAGNOSIS — Z794 Long term (current) use of insulin: Secondary | ICD-10-CM | POA: Diagnosis not present

## 2019-05-29 DIAGNOSIS — Z87442 Personal history of urinary calculi: Secondary | ICD-10-CM | POA: Diagnosis not present

## 2019-05-29 LAB — CBC WITH DIFFERENTIAL/PLATELET
Basophils Absolute: 0 10*3/uL (ref 0.0–0.1)
Basophils Relative: 0.4 % (ref 0.0–3.0)
Eosinophils Absolute: 0.2 10*3/uL (ref 0.0–0.7)
Eosinophils Relative: 2.3 % (ref 0.0–5.0)
HCT: 40.8 % (ref 39.0–52.0)
Hemoglobin: 13.7 g/dL (ref 13.0–17.0)
Lymphocytes Relative: 23 % (ref 12.0–46.0)
Lymphs Abs: 2 10*3/uL (ref 0.7–4.0)
MCHC: 33.6 g/dL (ref 30.0–36.0)
MCV: 88.7 fl (ref 78.0–100.0)
Monocytes Absolute: 0.6 10*3/uL (ref 0.1–1.0)
Monocytes Relative: 7.2 % (ref 3.0–12.0)
Neutro Abs: 5.7 10*3/uL (ref 1.4–7.7)
Neutrophils Relative %: 67.1 % (ref 43.0–77.0)
Platelets: 236 10*3/uL (ref 150.0–400.0)
RBC: 4.61 Mil/uL (ref 4.22–5.81)
RDW: 13.6 % (ref 11.5–15.5)
WBC: 8.5 10*3/uL (ref 4.0–10.5)

## 2019-05-29 LAB — BASIC METABOLIC PANEL
BUN: 20 mg/dL (ref 6–23)
CO2: 28 mEq/L (ref 19–32)
Calcium: 10.1 mg/dL (ref 8.4–10.5)
Chloride: 106 mEq/L (ref 96–112)
Creatinine, Ser: 1.43 mg/dL (ref 0.40–1.50)
GFR: 50.58 mL/min — ABNORMAL LOW (ref >60.00)
Glucose, Bld: 88 mg/dL (ref 70–99)
Potassium: 4.7 mEq/L (ref 3.5–5.1)
Sodium: 141 mEq/L (ref 135–145)

## 2019-05-29 NOTE — Progress Notes (Signed)
Diabetes:  Using medications without difficulties: yes Hypoglycemic episodes: no Hyperglycemic episodes: see below.   Blood Sugars averaging: see below.  eye exam within last year: Last A1c 7.9.    Sugar has usually been >200 recently.  He is going to see endocrine soon.  He hasn't been active for the last 2 weeks due to renal stones and that may have likely affected his readings.   He has felt a little lightheaded over the last week, while his sugar was elevated.    Prior to renal stone, his sugar was ~150, clearly <200.   He had urology follow up, w/o stone removal and then stent removal.    He has had some R sided flank pain that he attributed to a muscle strain or from the renal stones/procedure.  He is still drinking about 32 oz of water a day.    Flu shot done at [facility] 3 weeks ago per patient report.   Meds, vitals, and allergies reviewed.  ROS: Per HPI unless specifically indicated in ROS section   GEN: nad, alert and oriented HEENT: ncat NECK: supple w/o LA CV: rrr. PULM: ctab, no inc wob ABD: soft, +bs EXT: no edema SKIN: no acute rash  He feels slightly lightheaded but clearly felt better after taking a snack here at the office  See notes on labs.

## 2019-05-29 NOTE — Patient Instructions (Signed)
Go to the lab on the way out.  We'll contact you with your lab report. Likely that the residual right flank pain is either a holdover from previous renal stones or from a muscle strain.  Keep drinking plenty of water, enough to keep your urine clear.   If your sugar doesn't improve with activity, then gradually increase your insulin to 32/16--->34/17 units.  Keep the appointment with endocrinology.  Take care.  Glad to see you.

## 2019-05-31 NOTE — Assessment & Plan Note (Signed)
He had urology follow up, w/o stone removal and then stent removal.    He has had some R sided flank pain that he attributed to a muscle strain or from the renal stones/procedure.  He is still drinking about 32 oz of water a day.

## 2019-05-31 NOTE — Assessment & Plan Note (Addendum)
Last A1c 7.9.    Sugar has usually been >200 recently.  He is going to see endocrine soon.  He hasn't been active for the last 2 weeks due to renal stones and that may have likely affected his readings.   He has felt a little lightheaded over the last week, while his sugar was elevated.    Prior to renal stone, his sugar was ~150, clearly <200.   He may have been having some episodic hypoglycemia.  If he has persistent consistent sugar elevations then he could gradually increase his insulin, with hypoglycemia cautions.  He can also follow-up with endocrinology.  >25 minutes spent in face to face time with patient, >50% spent in counselling or coordination of care.

## 2019-06-06 DIAGNOSIS — N200 Calculus of kidney: Secondary | ICD-10-CM | POA: Diagnosis not present

## 2019-06-06 DIAGNOSIS — M545 Low back pain: Secondary | ICD-10-CM | POA: Diagnosis not present

## 2019-06-07 DIAGNOSIS — Z8639 Personal history of other endocrine, nutritional and metabolic disease: Secondary | ICD-10-CM | POA: Diagnosis not present

## 2019-06-07 DIAGNOSIS — E1165 Type 2 diabetes mellitus with hyperglycemia: Secondary | ICD-10-CM | POA: Diagnosis not present

## 2019-06-07 DIAGNOSIS — Z794 Long term (current) use of insulin: Secondary | ICD-10-CM | POA: Diagnosis not present

## 2019-06-07 DIAGNOSIS — E785 Hyperlipidemia, unspecified: Secondary | ICD-10-CM | POA: Diagnosis not present

## 2019-06-07 DIAGNOSIS — E1169 Type 2 diabetes mellitus with other specified complication: Secondary | ICD-10-CM | POA: Diagnosis not present

## 2019-06-07 DIAGNOSIS — I10 Essential (primary) hypertension: Secondary | ICD-10-CM | POA: Diagnosis not present

## 2019-06-07 DIAGNOSIS — E1159 Type 2 diabetes mellitus with other circulatory complications: Secondary | ICD-10-CM | POA: Diagnosis not present

## 2019-06-19 ENCOUNTER — Telehealth: Payer: Self-pay

## 2019-06-19 ENCOUNTER — Ambulatory Visit: Payer: Medicare HMO | Admitting: Family Medicine

## 2019-06-19 NOTE — Telephone Encounter (Signed)
Noted. Thanks.

## 2019-06-19 NOTE — Telephone Encounter (Signed)
Called pt and he said he hurt his back over the weekend and was unable to make it. He said he will call back and reschedule. I removed him from schedule.

## 2019-06-19 NOTE — Telephone Encounter (Signed)
[facility] Night - Client Nonclinical Telephone Record AccessNurse Client [facility] Night - Client Client Site [facility] [place] Physician Renford Dills - MD Contact Type Call Who Is Calling Patient / Member / Family / Caregiver Caller Name Winner Phone Number [phone] Patient Name Tony Manning Patient DOB August 14, 1961 Call Type Message Only Information Provided Reason for Call Request to Reschedule Office Appointment Initial Comment Caller states he has an 8am appt today, not able to make it, down in back, and needing a call back to reschedule it. Additional Comment Provided information for a call back from the office. Call Closed By: Rosana Fret Transaction Date/Time: 06/19/2019 7:35:06 AM (ET)

## 2019-06-22 ENCOUNTER — Other Ambulatory Visit: Payer: Self-pay | Admitting: Family Medicine

## 2019-06-23 ENCOUNTER — Other Ambulatory Visit: Payer: Self-pay

## 2019-06-23 MED ORDER — CARVEDILOL 6.25 MG PO TABS: 6.2500 mg | ORAL_TABLET | Freq: Two times a day (BID) | ORAL | 0 refills | Status: DC

## 2019-06-26 DIAGNOSIS — R82998 Other abnormal findings in urine: Secondary | ICD-10-CM | POA: Diagnosis not present

## 2019-06-26 DIAGNOSIS — N200 Calculus of kidney: Secondary | ICD-10-CM | POA: Diagnosis not present

## 2019-07-05 DIAGNOSIS — G4733 Obstructive sleep apnea (adult) (pediatric): Secondary | ICD-10-CM | POA: Diagnosis not present

## 2019-07-07 ENCOUNTER — Other Ambulatory Visit: Payer: Self-pay | Admitting: Cardiology

## 2019-07-07 ENCOUNTER — Other Ambulatory Visit: Payer: Self-pay | Admitting: Family Medicine

## 2019-07-13 DIAGNOSIS — Z8639 Personal history of other endocrine, nutritional and metabolic disease: Secondary | ICD-10-CM | POA: Diagnosis not present

## 2019-07-13 DIAGNOSIS — E785 Hyperlipidemia, unspecified: Secondary | ICD-10-CM | POA: Diagnosis not present

## 2019-07-13 DIAGNOSIS — E1169 Type 2 diabetes mellitus with other specified complication: Secondary | ICD-10-CM | POA: Diagnosis not present

## 2019-07-13 DIAGNOSIS — E1159 Type 2 diabetes mellitus with other circulatory complications: Secondary | ICD-10-CM | POA: Diagnosis not present

## 2019-07-13 DIAGNOSIS — E1165 Type 2 diabetes mellitus with hyperglycemia: Secondary | ICD-10-CM | POA: Diagnosis not present

## 2019-07-13 DIAGNOSIS — Z794 Long term (current) use of insulin: Secondary | ICD-10-CM | POA: Diagnosis not present

## 2019-07-13 DIAGNOSIS — I10 Essential (primary) hypertension: Secondary | ICD-10-CM | POA: Diagnosis not present

## 2019-07-14 ENCOUNTER — Other Ambulatory Visit: Payer: Self-pay

## 2019-07-14 MED ORDER — CLOPIDOGREL BISULFATE 75 MG PO TABS: 75.0000 mg | ORAL_TABLET | Freq: Every day | ORAL | 3 refills | Status: DC

## 2019-09-02 ENCOUNTER — Other Ambulatory Visit: Payer: Self-pay | Admitting: Family Medicine

## 2019-09-14 ENCOUNTER — Telehealth: Payer: Self-pay

## 2019-09-14 NOTE — Telephone Encounter (Signed)
He can stop plavix now and have procedure 5 days later. But many dentists do not need this stopped. He has any ways completed the course and can stop permanently.

## 2019-09-14 NOTE — Telephone Encounter (Signed)
Patient wants to have some teeth extracted. How many days does he need to be off of Plavix before he has teeth extracted and when does he need to start taking it again after procedure. Please advise.

## 2019-09-15 NOTE — Telephone Encounter (Signed)
Discussed recommendation. Patient verbalized understanding.

## 2019-10-04 ENCOUNTER — Ambulatory Visit: Payer: Medicare HMO | Admitting: Cardiology

## 2019-10-04 ENCOUNTER — Encounter: Payer: Self-pay | Admitting: Cardiology

## 2019-10-04 ENCOUNTER — Other Ambulatory Visit: Payer: Self-pay

## 2019-10-04 VITALS — BP 130/69 | HR 65 | Temp 98.1°F | Ht 70.0 in | Wt 219.6 lb

## 2019-10-04 DIAGNOSIS — E782 Mixed hyperlipidemia: Secondary | ICD-10-CM | POA: Diagnosis not present

## 2019-10-04 DIAGNOSIS — I25118 Atherosclerotic heart disease of native coronary artery with other forms of angina pectoris: Secondary | ICD-10-CM

## 2019-10-04 DIAGNOSIS — I10 Essential (primary) hypertension: Secondary | ICD-10-CM | POA: Diagnosis not present

## 2019-10-04 MED ORDER — NITROGLYCERIN 0.4 MG SL SUBL: 0.4000 mg | SUBLINGUAL_TABLET | SUBLINGUAL | 3 refills | Status: DC | PRN

## 2019-10-04 NOTE — Progress Notes (Signed)
Primary Physician/Referring:  Tonia Ghent, MD  Patient ID: Tony Manning, male    DOB: [birth year], 59 y.o.   MRN: [id number]  Chief Complaint  Patient presents with  . Coronary Artery Disease  . Chest Pain  . Follow-up    15mo    HPI: [name]  is a 59 y.o. male  with coronary artery disease status post PCI to RCA and LAD, long-standing degenerative disc disease and involving neck and shoulders, hyperlipidemia, OSA on CPAP, controlled hypertension presents for acute visit for chest pain.   Reports that while lying in bed last night he had chest pain that radiated down his left arm. He took nitroglycerin. Pain resolved 30-40 mins. Nitro did not completely resolve, but did ease it off. Other than this one episode, he has overall felt well.   DM continues to be uncontrolled. Has continued arthritis issues especially back and also bilateral shoulder.    Past Medical History:  Diagnosis Date  . Anginal pain ([place])   . Basal cell carcinoma of face   . Chest pain, unspecified   . Coronary atherosclerosis of native coronary artery    s/p stent x4  . Depression   . Diabetes mellitus without mention of complication   . Diverticulosis   . Dyspnea   . Dysrhythmia   . Erectile dysfunction   . Essential hypertension, benign   . GERD (gastroesophageal reflux disease)   . Heart murmur   . History of kidney stones   . History of nephrolithiasis   . Migraine   . Myocardial infarction ([place])   . Neuromuscular disorder ([place])   . Osteoarthritis   . Pneumonia   . Postsurgical percutaneous transluminal coronary angioplasty status    s/p stent x4  . Sarcoidosis   . Unspecified sleep apnea    uses C-pap    Past Surgical History:  Procedure Laterality Date  . ANTERIOR CERVICAL DECOMP/DISCECTOMY FUSION N/A 04/07/2017   Procedure: ANTERIOR CERVICAL DECOMPRESSION FUSION, CERVICAL 4-5 WITH INSTRUMENTATION AND ALLOGRAFT;  Surgeon: Phylliss Bob, MD;  Location: [facility];   Service: Orthopedics;  Laterality: N/A;  ANTERIOR CERVICAL DECOMPRESSION FUSION, CERVICAL 4-5 WITH INSTRUMENTATION AND ALLOGRAFT; REQUEST 2.5 HOURS AND FLIP ROOM  . APPENDECTOMY  2005  . CARPAL TUNNEL RELEASE    . CERVICAL FUSION  2009  . CORONARY ANGIOPLASTY WITH STENT PLACEMENT    . CORONARY STENT INTERVENTION N/A 10/16/2016   Procedure: Coronary Stent Intervention;  Surgeon: Adrian Prows, MD;  Location: [facility];  Service: Cardiovascular;  Laterality: N/A;  . CYSTOSCOPY WITH RETROGRADE PYELOGRAM, URETEROSCOPY AND STENT PLACEMENT Right 05/12/2019   Procedure: CYSTOSCOPY WITH RIGHT RETROGRADE PYELOGRAM, URETEROSCOPY HOLMIUM LASER AND STENT PLACEMENT;  Surgeon: Lucas Mallow, MD;  Location: [facility];  Service: Urology;  Laterality: Right;  . ELBOW SURGERY     bilateral  . KNEE SURGERY     Bilateral   . LEFT HEART CATH AND CORONARY ANGIOGRAPHY N/A 10/16/2016   Procedure: Left Heart Cath and Coronary Angiography;  Surgeon: Adrian Prows, MD;  Location: [facility];  Service: Cardiovascular;  Laterality: N/A;  . LEFT HEART CATHETERIZATION WITH CORONARY ANGIOGRAM N/A 06/14/2014   Procedure: LEFT HEART CATHETERIZATION WITH CORONARY ANGIOGRAM;  Surgeon: Laverda Page, MD;  Location: [facility];  Service: Cardiovascular;  Laterality: N/A;  . LUNG BIOPSY     sarcoid  . ROTATOR CUFF REPAIR     left  . SHOULDER ARTHROSCOPY WITH SUBACROMIAL DECOMPRESSION Left 06/16/2018  Procedure: LEFT SHOULDER ARTHROSCOPY WITH ROTATOR CUFF DEBRIDEMENT AND  SUBACROMIAL DECOMPRESSION;  Surgeon: Tania Ade, MD;  Location: [facility];  Service: Orthopedics;  Laterality: Left;    Social History   Socioeconomic History  . Marital status: Married    Spouse name: Not on file  . Number of children: 2  . Years of education: Not on file  . Highest education level: Not on file  Occupational History  . Occupation: disabled    Fish farm manager: unemployed  Tobacco Use  . Smoking status: Never Smoker  . Smokeless  tobacco: Former Systems developer    Types: Chew  . Tobacco comment: trying to quit  Substance and Sexual Activity  . Alcohol use: No    Alcohol/week: 0.0 standard drinks  . Drug use: No  . Sexual activity: Yes    Birth control/protection: Condom  Other Topics Concern  . Not on file  Social History Narrative   Married, 1987   2 kids   On disability   Social Determinants of Health   Financial Resource Strain:   . Difficulty of Paying Living Expenses: Not on file  Food Insecurity:   . Worried About Charity fundraiser in the Last Year: Not on file  . Ran Out of Food in the Last Year: Not on file  Transportation Needs:   . Lack of Transportation (Medical): Not on file  . Lack of Transportation (Non-Medical): Not on file  Physical Activity:   . Days of Exercise per Week: Not on file  . Minutes of Exercise per Session: Not on file  Stress:   . Feeling of Stress : Not on file  Social Connections:   . Frequency of Communication with Friends and Family: Not on file  . Frequency of Social Gatherings with Friends and Family: Not on file  . Attends Religious Services: Not on file  . Active Member of Clubs or Organizations: Not on file  . Attends Archivist Meetings: Not on file  . Marital Status: Not on file  Intimate Partner Violence:   . Fear of Current or Ex-Partner: Not on file  . Emotionally Abused: Not on file  . Physically Abused: Not on file  . Sexually Abused: Not on file   Review of Systems  Constitution: Negative for chills, decreased appetite, malaise/fatigue and weight gain.  Cardiovascular: Positive for chest pain and dyspnea on exertion. Negative for leg swelling and syncope.  Endocrine: Negative for cold intolerance.  Hematologic/Lymphatic: Does not bruise/bleed easily.  Musculoskeletal: Positive for back pain, joint pain (bilateral shoulders) and neck pain. Negative for joint swelling.  Gastrointestinal: Negative for abdominal pain, anorexia, change in bowel  habit, hematochezia and melena.  Neurological: Negative for headaches and light-headedness.  Psychiatric/Behavioral: Negative for depression and substance abuse.  All other systems reviewed and are negative.     Objective  Blood pressure 130/69, pulse 65, temperature 98.1 F (36.7 C), height 5\' 10"  (1.778 m), weight 219 lb 9.6 oz (99.6 kg), SpO2 97 %. Body mass index is 31.51 kg/m.    Physical Exam  Constitutional: He appears well-developed and well-nourished. No distress.  HENT:  Head: Atraumatic.  Eyes: Conjunctivae are normal.  Neck: No JVD present. No thyromegaly present.  Cardiovascular: Normal rate, regular rhythm, normal heart sounds and intact distal pulses. Exam reveals no gallop.  No murmur heard. Trace edema  Pulmonary/Chest: Effort normal and breath sounds normal.  Abdominal: Soft. Bowel sounds are normal.  Musculoskeletal:        General: Normal range  of motion.     Cervical back: Neck supple.  Neurological: He is alert.  Skin: Skin is warm and dry.  Psychiatric: He has a normal mood and affect.   Radiology: No results found.  Laboratory examination:   CMP Latest Ref Rng & Units 05/29/2019 05/18/2019 05/05/2019  Glucose 70 - 99 mg/dL 88 203(H) 202(H)  BUN 6 - 23 mg/dL 20 29(H) 22(H)  Creatinine 0.40 - 1.50 mg/dL 1.43 1.66(H) 1.98(H)  Sodium 135 - 145 mEq/L 141 140 140  Potassium 3.5 - 5.1 mEq/L 4.7 4.7 4.1  Chloride 96 - 112 mEq/L 106 108 107  CO2 19 - 32 mEq/L 28 26 24   Calcium 8.4 - 10.5 mg/dL 10.1 9.6 9.1  Total Protein 6.5 - 8.1 g/dL - - 7.8  Total Bilirubin 0.3 - 1.2 mg/dL - - 1.1  Alkaline Phos 38 - 126 U/L - - 77  AST 15 - 41 U/L - - 17  ALT 0 - 44 U/L - - 20   CBC Latest Ref Rng & Units 05/29/2019 05/18/2019 05/05/2019  WBC 4.0 - 10.5 K/uL 8.5 8.4 13.2(H)  Hemoglobin 13.0 - 17.0 g/dL 13.7 12.8(L) 14.2  Hematocrit 39.0 - 52.0 % 40.8 38.3(L) 42.1  Platelets 150.0 - 400.0 K/uL 236.0 286.0 228   Lipid Panel     Component Value Date/Time   CHOL 129  12/07/2017 1043   TRIG 172.0 (H) 12/07/2017 1043   HDL 38.60 (L) 12/07/2017 1043   CHOLHDL 3 12/07/2017 1043   VLDL 34.4 12/07/2017 1043   LDLCALC 56 12/07/2017 1043   HEMOGLOBIN A1C Lab Results  Component Value Date   HGBA1C 7.9 (H) 05/11/2019   MPG 180.03 05/11/2019   TSH No results for input(s): TSH in the last 8760 hours. PRN Meds:. Medications Discontinued During This Encounter  Medication Reason  . oxyCODONE-acetaminophen (PERCOCET) 5-325 MG tablet Completed Course  . Potassium 99 MG TABS Patient Preference  . nitroGLYCERIN (NITROSTAT) 0.4 MG SL tablet Reorder   Current Meds  Medication Sig  . aspirin 81 MG chewable tablet Chew 81 mg by mouth at bedtime.  [name] Kitchen atorvastatin (LIPITOR) 40 MG tablet TAKE 1 TABLET (40 MG TOTAL) BY MOUTH DAILY. NEEDS OFFICE VISIT  . carvedilol (COREG) 6.25 MG tablet TAKE 1 TABLET BY MOUTH TWICE A DAY  . clopidogrel (PLAVIX) 75 MG tablet Take 1 tablet (75 mg total) by mouth daily.  . cyclobenzaprine (FLEXERIL) 5 MG tablet Take 5 mg by mouth at bedtime as needed for muscle spasms.   . diclofenac sodium (VOLTAREN) 1 % GEL Apply 2-4 g topically daily as needed (joint pain).  [name] Kitchen glimepiride (AMARYL) 4 MG tablet TAKE 1 TABLET BY MOUTH TWICE A DAY  . insulin NPH Human (NOVOLIN N) 100 UNIT/ML injection Inject 30 units in the AM, 15 units in the PM (Patient taking differently: Inject into the skin See admin instructions. Inject 22 units subcutaneously in the morning and 14 units in the evening)  . losartan (COZAAR) 100 MG tablet Take 1 tablet (100 mg total) by mouth daily.  . metFORMIN (GLUCOPHAGE) 1000 MG tablet TAKE 1 TABLET BY MOUTH TWICE A DAY WITH MEALS  . methocarbamol (ROBAXIN) 500 MG tablet Take 500 mg by mouth every 6 (six) hours as needed for muscle spasms.   . Multiple Vitamins-Minerals (AIRBORNE PO) Take 1 tablet by mouth. occas  . nitroGLYCERIN (NITROSTAT) 0.4 MG SL tablet Place 1 tablet (0.4 mg total) under the tongue every 5 (five) minutes as  needed for chest pain.  [name] Kitchen  pantoprazole (PROTONIX) 40 MG tablet TAKE 1 TABLET BY MOUTH EVERY DAY  . tamsulosin (FLOMAX) 0.4 MG CAPS capsule TAKE 2 CAPSULES BY MOUTH EVERY DAY (Patient taking differently: Take 0.4 mg by mouth daily. )  . [DISCONTINUED] nitroGLYCERIN (NITROSTAT) 0.4 MG SL tablet Place 1 tablet (0.4 mg total) under the tongue every 5 (five) minutes as needed for chest pain.    Cardiac Studies:   Lexiscan myoview stress test 01/07/2018: 1. Lexiscan stress test performed. Exercise capacity was not assessed. Stress symptoms included dyspnea, flushing. Normal blood pressure. The resting electrocardiogram demonstrated normal sinus rhythm, normal resting conduction, no resting arrhythmias and normal rest repolarization. Stress EKG is non diagnostic for ischemia as it is a pharmacologic stress. 2. The overall quality of the study is excellent. There is no evidence of abnormal lung activity. Stress and rest SPECT images demonstrate homogeneous tracer distribution throughout the myocardium. Gated SPECT imaging reveals normal myocardial thickening and wall motion. The left ventricular ejection fraction was normal (60%).  3. Low risk study.  Echocardiogram 02/01/2018: Left ventricle cavity is normal in size. Normal global wall motion. Indeterminate diastolic filling pattern, indeterminate LAP. Calculated EF 55%. No significant valvular abnormality. Inadequate tricuspid regurgitation jet to estimate pulmonary artery pressure. Normal right atrial pressure.  Coronary angiogram 10/16/2016: 3.0 x 38 mm onyx DES Prox RCA. 06/14/2014: Thrombectomy of the mid RCA & Stenting with 3.0 x 18 mm Xience Alpine DES. Stenting mid LAD 3.0 x 18 mm Xience Alpine DES. Patent Mid LAD 2.75x12 mm Taxus, 2.5x12 Distal RCA stent x 2 from 2004 and 2007. Normal LVEF. Diffuse CAD  Assessment   Coronary artery disease of native artery of native heart with stable angina pectoris ([place]) - Plan: EKG 12-Lead, nitroGLYCERIN  (NITROSTAT) 0.4 MG SL tablet  Essential hypertension, benign  Mixed hyperlipidemia  EKG 02/09/2019: Normal sinus rhythm with rate of 68 bpm, normal axis.  No evidence of ischemia, normal EKG.    Recommendations:   Patient is here for acute visit after having 1 episode of angina that occurred last night somewhat resolved with nitroglycerin.  He has overall been doing well without any significant episodes of angina.  I have refilled his nitroglycerin as his current prescription is expired and could have contributed to reason why he did not have complete resolution in chest pain.  He is without chest pain today.  Blood pressure is well controlled.  I would recommend that we continue with watchful waiting, he is instructed to contact me should he start having more frequent episodes of angina, can consider daily therapy including amlodipine or Imdur.  Potentially had coronary vasospasm contributing to his symptoms of angina.  No significant changes are noted to his EKG, he does have 2 PVCs on EKG today that are asymptomatic.  Encouraged to continue to work on diabetes control.  He has gained a few pounds since last seen by [place], encouraged him to work on this.  We will see him back as previously scheduled in June, but encouraged him to contact us sooner if needed.  Miquel Dunn, MSN, APRN, FNP-C [facility]. [place] Office: [phone] Fax: [phone]

## 2019-10-06 ENCOUNTER — Encounter: Payer: Self-pay | Admitting: Cardiology

## 2019-10-10 DIAGNOSIS — N200 Calculus of kidney: Secondary | ICD-10-CM | POA: Diagnosis not present

## 2019-10-11 DIAGNOSIS — N132 Hydronephrosis with renal and ureteral calculous obstruction: Secondary | ICD-10-CM | POA: Diagnosis not present

## 2019-10-11 DIAGNOSIS — N23 Unspecified renal colic: Secondary | ICD-10-CM | POA: Diagnosis not present

## 2019-10-11 DIAGNOSIS — N200 Calculus of kidney: Secondary | ICD-10-CM | POA: Diagnosis not present

## 2019-10-11 DIAGNOSIS — N202 Calculus of kidney with calculus of ureter: Secondary | ICD-10-CM | POA: Diagnosis not present

## 2019-10-22 ENCOUNTER — Other Ambulatory Visit: Payer: Self-pay | Admitting: Family Medicine

## 2019-10-26 ENCOUNTER — Other Ambulatory Visit: Payer: Self-pay | Admitting: Family Medicine

## 2019-10-27 NOTE — Telephone Encounter (Signed)
Patient states he is still taking Flomax and has been for a long time. He is taking 1 tablet daily not 2 daily. Updated this in the system and refill sent in

## 2019-11-10 ENCOUNTER — Other Ambulatory Visit: Payer: Self-pay | Admitting: Family Medicine

## 2019-11-10 MED ORDER — "INSULIN SYRINGE 31G X 5/16"" 1 ML MISC": 3 refills | Status: DC

## 2019-11-10 NOTE — Telephone Encounter (Signed)
Pt has requested novolin insulin from [facility]. Pt only has 1 more shot left; pt is also requesting refill for insulin syringes to [facility] as well.

## 2019-11-16 ENCOUNTER — Encounter: Payer: Self-pay | Admitting: Family Medicine

## 2019-11-16 ENCOUNTER — Ambulatory Visit (INDEPENDENT_AMBULATORY_CARE_PROVIDER_SITE_OTHER): Payer: Medicare HMO | Admitting: Family Medicine

## 2019-11-16 ENCOUNTER — Other Ambulatory Visit: Payer: Self-pay

## 2019-11-16 ENCOUNTER — Inpatient Hospital Stay: Admission: RE | Admit: 2019-11-16 | Payer: Medicare HMO | Source: Ambulatory Visit

## 2019-11-16 VITALS — BP 122/64 | HR 71 | Temp 96.7°F | Ht 70.0 in | Wt 216.5 lb

## 2019-11-16 DIAGNOSIS — Z794 Long term (current) use of insulin: Secondary | ICD-10-CM | POA: Diagnosis not present

## 2019-11-16 DIAGNOSIS — R29810 Facial weakness: Secondary | ICD-10-CM | POA: Insufficient documentation

## 2019-11-16 DIAGNOSIS — R29818 Other symptoms and signs involving the nervous system: Secondary | ICD-10-CM | POA: Diagnosis not present

## 2019-11-16 DIAGNOSIS — E119 Type 2 diabetes mellitus without complications: Secondary | ICD-10-CM | POA: Diagnosis not present

## 2019-11-16 DIAGNOSIS — E1149 Type 2 diabetes mellitus with other diabetic neurological complication: Secondary | ICD-10-CM | POA: Diagnosis not present

## 2019-11-16 LAB — POCT GLYCOSYLATED HEMOGLOBIN (HGB A1C): Hemoglobin A1C: 7.8 % — AB (ref 4.0–5.6)

## 2019-11-16 MED ORDER — INSULIN NPH (HUMAN) (ISOPHANE) 100 UNIT/ML ~~LOC~~ SUSP: SUBCUTANEOUS | Status: DC

## 2019-11-16 NOTE — Assessment & Plan Note (Signed)
Needs CT given weakness to exclude CVA.  D/w pt.  Not in window for ER eval with sx going on for days.  ddx d/w pt. He agrees.  If any new sx, then to ER/911.  He understood.

## 2019-11-16 NOTE — Progress Notes (Signed)
This visit occurred during the SARS-CoV-2 public health emergency.  Safety protocols were in place, including screening questions prior to the visit, additional usage of staff PPE, and extensive cleaning of exam room while observing appropriate contact time as indicated for disinfecting solutions.  Diabetes:  Using medications without difficulties: yes Hypoglycemic episodes: rare, see below.   Hyperglycemic episodes: no Feet problems: some aches, at baseline.   Blood Sugars averaging: 150-190 in the AMs usually.  He is on less insulin now, A1c similar.  Rare lows, d/w pt.   eye exam within last year: due, d/w pt.   A1c d/w pt at OV.   He hasn't been as active recently.    Numbness on the R side of the face in R V3 distribution.  Sx started a few days ago.  Sensation changes come and go.  He feels like the R facial changes started about 1 week ago.  In the last few days with feeling of episodic weakness on R side of face, noted more at the end of the day.  No L sided facial sx.  No vision changes. Normal grip now. No weakness in the hands or feet now but has had some episodes in the past where his R foot felt clumsy.  No L hand sx.  No rash.   Meds, vitals, and allergies reviewed.  ROS: Per HPI unless specifically indicated in ROS section   GEN: nad, alert and oriented HEENT: ncat, MMM.  NECK: supple w/o LA CV: rrr. PULM: ctab, no inc wob ABD: soft, +bs EXT: no edema SKIN: well perfused.    Slight facial R upper eyelid droop and and slight R lip droop.  No L sided sx.   CN 2-12 wnl B, S/S grossly wnl x4 o/w.  R biceps and R hip flexion weaker than L side and R grip weaker then L grip.

## 2019-11-16 NOTE — Assessment & Plan Note (Signed)
No change in meds at this point, will need CT done first given neuro sx.  He agrees.

## 2019-11-16 NOTE — Patient Instructions (Signed)
See Rosaria Ferries on the way out.   If you have new changes, then go to the ER.  We'll go from there.  Take care.  Glad to see you.

## 2019-11-17 ENCOUNTER — Other Ambulatory Visit: Payer: Self-pay | Admitting: Family Medicine

## 2019-11-17 ENCOUNTER — Ambulatory Visit (INDEPENDENT_AMBULATORY_CARE_PROVIDER_SITE_OTHER)
Admission: RE | Admit: 2019-11-17 | Discharge: 2019-11-17 | Disposition: A | Discharge status: Home / Self Care | Payer: Medicare HMO | Source: Ambulatory Visit | Attending: Family Medicine | Admitting: Family Medicine

## 2019-11-17 DIAGNOSIS — R200 Anesthesia of skin: Secondary | ICD-10-CM | POA: Diagnosis not present

## 2019-11-17 DIAGNOSIS — R29810 Facial weakness: Secondary | ICD-10-CM

## 2019-11-17 DIAGNOSIS — R29818 Other symptoms and signs involving the nervous system: Secondary | ICD-10-CM | POA: Diagnosis not present

## 2019-11-20 ENCOUNTER — Encounter: Payer: Self-pay | Admitting: Neurology

## 2019-12-04 ENCOUNTER — Other Ambulatory Visit: Payer: Self-pay | Admitting: Family Medicine

## 2019-12-04 ENCOUNTER — Other Ambulatory Visit: Payer: Self-pay | Admitting: Cardiology

## 2019-12-24 ENCOUNTER — Other Ambulatory Visit: Payer: Self-pay | Admitting: Family Medicine

## 2019-12-25 ENCOUNTER — Other Ambulatory Visit: Payer: Self-pay | Admitting: Family Medicine

## 2020-01-01 DIAGNOSIS — E113293 Type 2 diabetes mellitus with mild nonproliferative diabetic retinopathy without macular edema, bilateral: Secondary | ICD-10-CM | POA: Diagnosis not present

## 2020-01-01 DIAGNOSIS — H5203 Hypermetropia, bilateral: Secondary | ICD-10-CM | POA: Diagnosis not present

## 2020-01-01 DIAGNOSIS — H35033 Hypertensive retinopathy, bilateral: Secondary | ICD-10-CM | POA: Diagnosis not present

## 2020-01-01 DIAGNOSIS — H25041 Posterior subcapsular polar age-related cataract, right eye: Secondary | ICD-10-CM | POA: Diagnosis not present

## 2020-01-01 DIAGNOSIS — H524 Presbyopia: Secondary | ICD-10-CM | POA: Diagnosis not present

## 2020-01-01 DIAGNOSIS — I10 Essential (primary) hypertension: Secondary | ICD-10-CM | POA: Diagnosis not present

## 2020-01-01 DIAGNOSIS — H35013 Changes in retinal vascular appearance, bilateral: Secondary | ICD-10-CM | POA: Diagnosis not present

## 2020-01-01 LAB — HM DIABETES EYE EXAM

## 2020-01-02 ENCOUNTER — Other Ambulatory Visit: Payer: Self-pay | Admitting: Family Medicine

## 2020-01-09 ENCOUNTER — Ambulatory Visit: Payer: Medicare HMO | Admitting: Family Medicine

## 2020-01-09 NOTE — Progress Notes (Signed)
NEUROLOGY CONSULTATION NOTE  Tony CLEMON MRN: [id number] DOB: [birth year]  Referring provider: Elveria Manning. Tony Dunnings, MD Primary care provider: Elveria Manning. Tony Dunnings, MD  Reason for consult:  Facial weakness  HISTORY OF PRESENT ILLNESS: Tony Manning. Peot is a 59 year old right-handed white male with diabetes mellitus, CAD s/p stent x4, HTN, and sarcoidosis who presents for facial weakness.  History supplemented by referring provider's note.  In March, he was diagnosed with right Bell's palsy.  He had right sided facial tingling and drooping.  No difficulty closing right eye or raising right eyebrow.  No change in taste.  He has poor vision but thinks that his vision may have been more blurry.  No hyperacusis in the right ear.  No headache.  No preceding viral illness.  Symptoms lasted about a month.  He has diabetic neuropathy but for quite some time, he notes progressive right sided arm and leg weakness.  He has chronic back and right hip pain.  CT head without contrast from 11/17/2019 personally reviewed and was unremarkable.    He has uncontrolled diabetes.  Hgb A1c was 7.8. He has past history of sarcoidosis involving the lung.  PAST MEDICAL HISTORY: Past Medical History:  Diagnosis Date  . Anginal pain ([place])   . Basal cell carcinoma of face   . Chest pain, unspecified   . Coronary atherosclerosis of native coronary artery    s/p stent x4  . Depression   . Diabetes mellitus without mention of complication   . Diverticulosis   . Dyspnea   . Dysrhythmia   . Erectile dysfunction   . Essential hypertension, benign   . GERD (gastroesophageal reflux disease)   . Heart murmur   . History of kidney stones   . History of nephrolithiasis   . Migraine   . Myocardial infarction ([place])   . Neuromuscular disorder ([place])   . Osteoarthritis   . Pneumonia   . Postsurgical percutaneous transluminal coronary angioplasty status    s/p stent x4  . Sarcoidosis   . Unspecified sleep apnea     uses C-pap    PAST SURGICAL HISTORY: Past Surgical History:  Procedure Laterality Date  . ANTERIOR CERVICAL DECOMP/DISCECTOMY FUSION N/A 04/07/2017   Procedure: ANTERIOR CERVICAL DECOMPRESSION FUSION, CERVICAL 4-5 WITH INSTRUMENTATION AND ALLOGRAFT;  Surgeon: Phylliss Bob, MD;  Location: [facility];  Service: Orthopedics;  Laterality: N/A;  ANTERIOR CERVICAL DECOMPRESSION FUSION, CERVICAL 4-5 WITH INSTRUMENTATION AND ALLOGRAFT; REQUEST 2.5 HOURS AND FLIP ROOM  . APPENDECTOMY  2005  . CARPAL TUNNEL RELEASE    . CERVICAL FUSION  2009  . CORONARY ANGIOPLASTY WITH STENT PLACEMENT    . CORONARY STENT INTERVENTION N/A 10/16/2016   Procedure: Coronary Stent Intervention;  Surgeon: Adrian Prows, MD;  Location: [facility];  Service: Cardiovascular;  Laterality: N/A;  . CYSTOSCOPY WITH RETROGRADE PYELOGRAM, URETEROSCOPY AND STENT PLACEMENT Right 05/12/2019   Procedure: CYSTOSCOPY WITH RIGHT RETROGRADE PYELOGRAM, URETEROSCOPY HOLMIUM LASER AND STENT PLACEMENT;  Surgeon: Lucas Mallow, MD;  Location: [facility];  Service: Urology;  Laterality: Right;  . ELBOW SURGERY     bilateral  . KNEE SURGERY     Bilateral   . LEFT HEART CATH AND CORONARY ANGIOGRAPHY N/A 10/16/2016   Procedure: Left Heart Cath and Coronary Angiography;  Surgeon: Adrian Prows, MD;  Location: [facility];  Service: Cardiovascular;  Laterality: N/A;  . LEFT HEART CATHETERIZATION WITH CORONARY ANGIOGRAM N/A 06/14/2014   Procedure: LEFT HEART CATHETERIZATION WITH CORONARY ANGIOGRAM;  Surgeon: Laverda Page, MD;  Location: [facility];  Service: Cardiovascular;  Laterality: N/A;  . LUNG BIOPSY     sarcoid  . ROTATOR CUFF REPAIR     left  . SHOULDER ARTHROSCOPY WITH SUBACROMIAL DECOMPRESSION Left 06/16/2018   Procedure: LEFT SHOULDER ARTHROSCOPY WITH ROTATOR CUFF DEBRIDEMENT AND  SUBACROMIAL DECOMPRESSION;  Surgeon: Tania Ade, MD;  Location: [facility];  Service: Orthopedics;  Laterality: Left;    MEDICATIONS: Current  Outpatient Medications on File Prior to Visit  Medication Sig Dispense Refill  . aspirin 81 MG chewable tablet Chew 81 mg by mouth at bedtime.    [name] Kitchen atorvastatin (LIPITOR) 40 MG tablet TAKE 1 TABLET (40 MG TOTAL) BY MOUTH DAILY. NEEDS OFFICE VISIT 90 tablet 1  . carvedilol (COREG) 6.25 MG tablet TAKE 1 TABLET BY MOUTH TWICE A DAY 180 tablet 3  . clopidogrel (PLAVIX) 75 MG tablet Take 1 tablet (75 mg total) by mouth daily. 90 tablet 3  . cyclobenzaprine (FLEXERIL) 5 MG tablet Take 5 mg by mouth at bedtime as needed for muscle spasms.     . diclofenac sodium (VOLTAREN) 1 % GEL Apply 2-4 g topically daily as needed (joint pain).    [name] Kitchen glimepiride (AMARYL) 4 MG tablet TAKE 1 TABLET BY MOUTH TWICE A DAY 180 tablet 1  . glucose blood (ONE TOUCH ULTRA TEST) test strip Check sugar up to 6 times a day.  DM2, insulin treated, uncontrolled. 200 each 12  . insulin NPH Human (NOVOLIN N) 100 UNIT/ML injection INJECT 30 UNITS IN THE AM, 15 UNITS IN THE PM 10 mL 1  . Insulin Pen Needle 32G X 4 MM MISC Use daily with insulin pen. 100 each 3  . Insulin Syringe-Needle U-100 (INSULIN SYRINGE 1CC/31GX5/16") 31G X 5/16" 1 ML MISC Use with NPH insulin twice daily. 100 each 3  . losartan (COZAAR) 100 MG tablet TAKE 1 TABLET BY MOUTH EVERY DAY 90 tablet 2  . metFORMIN (GLUCOPHAGE) 1000 MG tablet TAKE 1 TABLET BY MOUTH TWICE A DAY WITH MEALS 180 tablet 1  . methocarbamol (ROBAXIN) 500 MG tablet Take 500 mg by mouth every 6 (six) hours as needed for muscle spasms.     . Multiple Vitamins-Minerals (AIRBORNE PO) Take 1 tablet by mouth. occas    . nitroGLYCERIN (NITROSTAT) 0.4 MG SL tablet Place 1 tablet (0.4 mg total) under the tongue every 5 (five) minutes as needed for chest pain. 25 tablet 3  . pantoprazole (PROTONIX) 40 MG tablet TAKE 1 TABLET BY MOUTH EVERY DAY 30 tablet 3  . tamsulosin (FLOMAX) 0.4 MG CAPS capsule Take 1 capsule (0.4 mg total) by mouth daily. 90 capsule 2   No current facility-administered medications  on file prior to visit.    ALLERGIES: Allergies  Allergen Reactions  . Ambien [Zolpidem] Other (See Comments)    Parasomnias, sleep walking  . Lisinopril Cough  . Tramadol Diarrhea    FAMILY HISTORY: Family History  Problem Relation Age of Onset  . Arthritis Mother   . Depression Mother   . Stroke Mother   . Arthritis Father   . Diabetes Father   . Coronary artery disease Father   . Colon cancer Neg Hx   . Prostate cancer Neg Hx   . Esophageal cancer Neg Hx   . Rectal cancer Neg Hx   . Stomach cancer Neg Hx     SOCIAL HISTORY: Social History   Socioeconomic History  . Marital status: Married    Spouse name: Not  on file  . Number of children: 2  . Years of education: Not on file  . Highest education level: Not on file  Occupational History  . Occupation: disabled    Fish farm manager: unemployed  Tobacco Use  . Smoking status: Never Smoker  . Smokeless tobacco: Former Systems developer    Types: Chew  . Tobacco comment: trying to quit  Substance and Sexual Activity  . Alcohol use: No    Alcohol/week: 0.0 standard drinks  . Drug use: No  . Sexual activity: Yes    Birth control/protection: Condom  Other Topics Concern  . Not on file  Social History Narrative   Married, 1987   2 kids   On disability   Social Determinants of Health   Financial Resource Strain:   . Difficulty of Paying Living Expenses:   Food Insecurity:   . Worried About Charity fundraiser in the Last Year:   . Arboriculturist in the Last Year:   Transportation Needs:   . Film/video editor (Medical):   [name] Kitchen Lack of Transportation (Non-Medical):   Physical Activity:   . Days of Exercise per Week:   . Minutes of Exercise per Session:   Stress:   . Feeling of Stress :   Social Connections:   . Frequency of Communication with Friends and Family:   . Frequency of Social Gatherings with Friends and Family:   . Attends Religious Services:   . Active Member of Clubs or Organizations:   . Attends Theatre manager Meetings:   [name] Kitchen Marital Status:   Intimate Partner Violence:   . Fear of Current or Ex-Partner:   . Emotionally Abused:   [name] Kitchen Physically Abused:   . Sexually Abused:     PHYSICAL EXAM: Blood pressure 130/68, pulse 63, height 5\' 10"  ([id number] m), weight 214 lb (97.1 kg), SpO2 98 %. General: No acute distress.  Patient appears well-groomed.  Head:  Normocephalic/atraumatic Eyes:  fundi examined but not visualized Neck: supple, no paraspinal tenderness, full range of motion Back: No paraspinal tenderness Heart: regular rate and rhythm Lungs: Clear to auscultation bilaterally. Vascular: No carotid bruits. Neurological Exam: Mental status: alert and oriented to person, place, and time, recent and remote memory intact, fund of knowledge intact, attention and concentration intact, speech fluent and not dysarthric, language intact. Cranial nerves: CN I: not tested CN II: pupils equal, round and reactive to light, visual fields intact CN III, IV, VI:  full range of motion, no nystagmus, no ptosis CN V: facial sensation intact CN VII: upper and lower face symmetric CN VIII: hearing intact CN IX, X: gag intact, uvula midline CN XI: sternocleidomastoid and trapezius muscles intact CN XII: tongue midline Bulk & Tone: normal, no fasciculations. Motor:  5-/5 right triceps and give-way weakness right hip flexion, limited by pain.  Otherwise, 5/5. Sensation:  Pinprick sensation intact and vibration sensation slightly reduced in fingers and toes on right. . Deep Tendon Reflexes:  2+ throughout, toes downgoing.  Finger to nose testing:  Without dysmetria.  Heel to shin:  Without dysmetria.  Gait:  Normal station and stride.  Able to turn; tandem walk with slight unsteadiness. Romberg with mild sway.  IMPRESSION: Episode of right sided facial numbness and possibly facial weakness.  Unclear etiology.  He describes right facial numbness and only lower facial weakness, which is not consistent  with facial nerve palsy, however I wasn't there to personally evaluate.  Separately, he endorses some longstanding right arm and leg  weakness which may be related to pain (history of cervical radiculitis and lumbar radiculopathy) and a separate issue.  However, given these other symptoms, and history of sarcoidosis, further testing is warranted.    PLAN: MRI of brain with and without contrast Further recommendations pending results.  Thank you for allowing me to take part in the care of this patient.  Metta Clines, DO  CC: [name], MD

## 2020-01-11 ENCOUNTER — Ambulatory Visit: Payer: Medicare HMO | Admitting: Neurology

## 2020-01-11 ENCOUNTER — Encounter: Payer: Self-pay | Admitting: Neurology

## 2020-01-11 ENCOUNTER — Other Ambulatory Visit: Payer: Self-pay

## 2020-01-11 VITALS — BP 130/68 | HR 63 | Ht 70.0 in | Wt 214.0 lb

## 2020-01-11 DIAGNOSIS — E1149 Type 2 diabetes mellitus with other diabetic neurological complication: Secondary | ICD-10-CM | POA: Diagnosis not present

## 2020-01-11 DIAGNOSIS — R29810 Facial weakness: Secondary | ICD-10-CM | POA: Diagnosis not present

## 2020-01-11 DIAGNOSIS — R29898 Other symptoms and signs involving the musculoskeletal system: Secondary | ICD-10-CM | POA: Diagnosis not present

## 2020-01-11 DIAGNOSIS — Z794 Long term (current) use of insulin: Secondary | ICD-10-CM | POA: Diagnosis not present

## 2020-01-11 DIAGNOSIS — R200 Anesthesia of skin: Secondary | ICD-10-CM | POA: Diagnosis not present

## 2020-01-11 NOTE — Patient Instructions (Addendum)
Will check MRI of brain with and without contrast. We have sent a referral to [facility] for your MRI and they will call you directly to schedule your appointment. They are located at [place]. If you need to contact them directly please call [phone].   Further recommendations pending result.

## 2020-01-22 DIAGNOSIS — N200 Calculus of kidney: Secondary | ICD-10-CM | POA: Diagnosis not present

## 2020-01-22 DIAGNOSIS — R310 Gross hematuria: Secondary | ICD-10-CM | POA: Diagnosis not present

## 2020-01-29 ENCOUNTER — Other Ambulatory Visit: Payer: Self-pay

## 2020-01-29 ENCOUNTER — Ambulatory Visit (INDEPENDENT_AMBULATORY_CARE_PROVIDER_SITE_OTHER): Payer: Medicare HMO | Admitting: Family Medicine

## 2020-01-29 ENCOUNTER — Encounter: Payer: Self-pay | Admitting: Family Medicine

## 2020-01-29 VITALS — BP 118/68 | HR 68 | Temp 97.9°F | Ht 70.0 in | Wt 216.2 lb

## 2020-01-29 DIAGNOSIS — M5440 Lumbago with sciatica, unspecified side: Secondary | ICD-10-CM | POA: Diagnosis not present

## 2020-01-29 DIAGNOSIS — G8929 Other chronic pain: Secondary | ICD-10-CM | POA: Diagnosis not present

## 2020-01-29 DIAGNOSIS — R300 Dysuria: Secondary | ICD-10-CM | POA: Diagnosis not present

## 2020-01-29 DIAGNOSIS — M545 Low back pain, unspecified: Secondary | ICD-10-CM | POA: Insufficient documentation

## 2020-01-29 LAB — POC URINALSYSI DIPSTICK (AUTOMATED)
Bilirubin, UA: NEGATIVE
Blood, UA: NEGATIVE
Glucose, UA: NEGATIVE
Ketones, UA: NEGATIVE
Leukocytes, UA: NEGATIVE
Nitrite, UA: NEGATIVE
Protein, UA: NEGATIVE
Spec Grav, UA: >=1.03 — AB (ref 1.010–1.025)
Urobilinogen, UA: 0.2 E.U./dL
pH, UA: 6 (ref 5.0–8.0)

## 2020-01-29 MED ORDER — AMITRIPTYLINE HCL 10 MG PO TABS: 10.0000 mg | ORAL_TABLET | Freq: Every day | ORAL | 1 refills | Status: DC

## 2020-01-29 NOTE — Patient Instructions (Addendum)
Use heat or ice for back pain Walking is a good strategy   Start amitriptyline at bedtime 10 mg Let Dr [name] know how your pain is in 1-2 week   Return to orthopedics is an option   Urine is clear today (no blood or signs of infection)    Keep [place] posted

## 2020-01-29 NOTE — Assessment & Plan Note (Signed)
ua clear but concentrated  Enc pt co inc fluid level to make urine more dilute and update if no imp

## 2020-01-29 NOTE — Progress Notes (Signed)
   Subjective:    Patient ID: Tony Manning, male    DOB: [birth year], 60 y.o.   MRN: [id number]  HPI    Review of Systems     Objective:   Physical Exam        Assessment & Plan:

## 2020-01-29 NOTE — Progress Notes (Signed)
Subjective:    Patient ID: Tony Manning, male    DOB: Dec 14, 1960, 59 y.o.   MRN: [id number]  This visit occurred during the SARS-CoV-2 public health emergency.  Safety protocols were in place, including screening questions prior to the visit, additional usage of staff PPE, and extensive cleaning of exam room while observing appropriate contact time as indicated for disinfecting solutions.    HPI 59 yo pt of Dr [name] presents with low back pain / ? If injured kidney  Had a fall  H/o kidney stone   Wt Readings from Last 3 Encounters:  01/29/20 216 lb 4 oz (98.1 kg)  01/11/20 214 lb (97.1 kg)  11/16/19 216 lb 8 oz (98.2 kg)   31.03 kg/m   He fell a few weeks ago  [place] off a ladder  [place] on the R side  Saw urologist last week and has stones on the right  Thinks he "bruised a kidney" -told him to take it easy for a week   Pain is L sided -with spasms /occ sharp pain  Had rib pain on r-that is improved  Still fatigued   For the L sided back pain  Muscle relaxers don't work  Oxycodone- takes 1/2 pill occ for severe   Has had orthopedic care in the past  Did exercises-no help   Had amitriptyline in the past    Last MRI showed some bulging disc (unsure what level)  Thinks it was 2 years ago  [place] [facility]  He is open to giving it more time     UA today Results for orders placed or performed in visit on 01/29/20  POCT Urinalysis Dipstick (Automated)  Result Value Ref Range   Color, UA Yellow    Clarity, UA Clear    Glucose, UA Negative Negative   Bilirubin, UA Negative    Ketones, UA Negative    Spec Grav, UA >=1.030 (A) 1.010 - 1.025   Blood, UA Negative    pH, UA 6.0 5.0 - 8.0   Protein, UA Negative Negative   Urobilinogen, UA 0.2 0.2 or 1.0 E.U./dL   Nitrite, UA Negative    Leukocytes, UA Negative Negative    Has had dysuria on/off in past  Does not drink enough water  Patient Active Problem List   Diagnosis Date Noted  . Left low back  pain 01/29/2020  . Facial weakness 11/16/2019  . Dysuria 01/15/2019  . Diarrhea 05/09/2018  . Hematuria 03/22/2018  . Renal stones 03/22/2018  . Skin infection 12/18/2017  . Vitamin D deficiency, unspecified 10/04/2017  . Chronic neck pain (Primary Area of Pain) (B) (L>R) 09/21/2017  . Chronic upper extremity pain (Secondary Area of Pain) (Bilateral) (L>R) 09/21/2017  . Chronic left shoulder pain (Tertiary Area of Pain) 09/21/2017  . Chronic low back pain (Fourth Area of Pain) (B) (R>L) 09/21/2017  . Chronic pain of lower extremity (B) (R>L) 09/21/2017  . Disorder of bone, unspecified 09/21/2017  . Other long term (current) drug therapy 09/21/2017  . Other specified health status 09/21/2017  . Other chronic pain 08/22/2017  . Other fatigue 05/10/2017  . Radiculopathy 04/07/2017  . LLQ pain 10/21/2016  . Angina pectoris ([place]) 10/15/2016  . Cervical radiculitis 07/23/2016  . Dysphagia 06/12/2016  . Sciatica 02/06/2016  . Plantar fasciitis 01/16/2016  . Right sided sciatica 05/16/2015  . Radicular pain in right arm 04/05/2015  . Hand pain 04/05/2015  . Premature ejaculation 09/17/2014  . Heel pain 09/17/2014  . NSTEMI (non-ST  elevated myocardial infarction) ([place]) 06/13/2014  . Atypical chest pain 06/06/2014  . Memory change 05/24/2014  . Benign paroxysmal positional vertigo 07/13/2013  . Orthostasis 07/13/2013  . Migraine, unspecified, without mention of intractable migraine without mention of status migrainosus 04/03/2013  . Medicare annual wellness visit, initial 02/17/2013  . Pain in joint, ankle and foot 10/19/2012  . Irritable mood 01/18/2012  . Cough 12/21/2011  . Tobacco abuse 12/21/2011  . Skin lesion of face 04/27/2011  . Neck pain 12/03/2010  . Hearing loss 12/03/2010  . 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[place]) 10/20/2010  . HYPERCHOLESTEROLEMIA 10/20/2010  . DEPRESSION 10/20/2010  . OSTEOARTHRITIS 10/20/2010  . ARTHRITIS 10/20/2010  .  NEPHROLITHIASIS, HX OF 10/20/2010  . ESSENTIAL HYPERTENSION, BENIGN 09/30/2010  . CORONARY ATHEROSCLEROSIS NATIVE CORONARY ARTERY 09/30/2010  . SLEEP APNEA 09/30/2010  . CHEST PAIN UNSPECIFIED 09/30/2010  . PERCUTANEOUS TRANSLUMINAL CORONARY ANGIOPLASTY, HX OF 09/30/2010   Past Medical History:  Diagnosis Date  . Anginal pain ([place])   . Basal cell carcinoma of face   . Chest pain, unspecified   . Coronary atherosclerosis of native coronary artery    s/p stent x4  . Depression   . Diabetes mellitus without mention of complication   . Diverticulosis   . Dyspnea   . Dysrhythmia   . Erectile dysfunction   . Essential hypertension, benign   . GERD (gastroesophageal reflux disease)   . Heart murmur   . History of kidney stones   . History of nephrolithiasis   . Migraine   . Myocardial infarction ([place])   . Neuromuscular disorder ([place])   . Osteoarthritis   . Pneumonia   . Postsurgical percutaneous transluminal coronary angioplasty status    s/p stent x4  . Sarcoidosis   . Unspecified sleep apnea    uses C-pap   Past Surgical History:  Procedure Laterality Date  . ANTERIOR CERVICAL DECOMP/DISCECTOMY FUSION N/A 04/07/2017   Procedure: ANTERIOR CERVICAL DECOMPRESSION FUSION, CERVICAL 4-5 WITH INSTRUMENTATION AND ALLOGRAFT;  Surgeon: Phylliss Bob, MD;  Location: [facility];  Service: Orthopedics;  Laterality: N/A;  ANTERIOR CERVICAL DECOMPRESSION FUSION, CERVICAL 4-5 WITH INSTRUMENTATION AND ALLOGRAFT; REQUEST 2.5 HOURS AND FLIP ROOM  . APPENDECTOMY  2005  . CARPAL TUNNEL RELEASE    . CERVICAL FUSION  2009  . CORONARY ANGIOPLASTY WITH STENT PLACEMENT    . CORONARY STENT INTERVENTION N/A 10/16/2016   Procedure: Coronary Stent Intervention;  Surgeon: Adrian Prows, MD;  Location: [facility];  Service: Cardiovascular;  Laterality: N/A;  . CYSTOSCOPY WITH RETROGRADE PYELOGRAM, URETEROSCOPY AND STENT PLACEMENT Right 05/12/2019   Procedure: CYSTOSCOPY WITH RIGHT RETROGRADE PYELOGRAM,  URETEROSCOPY HOLMIUM LASER AND STENT PLACEMENT;  Surgeon: Lucas Mallow, MD;  Location: [facility];  Service: Urology;  Laterality: Right;  . ELBOW SURGERY     bilateral  . KNEE SURGERY     Bilateral   . LEFT HEART CATH AND CORONARY ANGIOGRAPHY N/A 10/16/2016   Procedure: Left Heart Cath and Coronary Angiography;  Surgeon: Adrian Prows, MD;  Location: [facility];  Service: Cardiovascular;  Laterality: N/A;  . LEFT HEART CATHETERIZATION WITH CORONARY ANGIOGRAM N/A 06/14/2014   Procedure: LEFT HEART CATHETERIZATION WITH CORONARY ANGIOGRAM;  Surgeon: Laverda Page, MD;  Location: [facility];  Service: Cardiovascular;  Laterality: N/A;  . LUNG BIOPSY     sarcoid  . ROTATOR CUFF REPAIR     left  . SHOULDER ARTHROSCOPY WITH SUBACROMIAL DECOMPRESSION Left 06/16/2018   Procedure: LEFT SHOULDER ARTHROSCOPY WITH  ROTATOR CUFF DEBRIDEMENT AND  SUBACROMIAL DECOMPRESSION;  Surgeon: Tania Ade, MD;  Location: [facility];  Service: Orthopedics;  Laterality: Left;   Social History   Tobacco Use  . Smoking status: Never Smoker  . Smokeless tobacco: Former Systems developer    Types: Chew  . Tobacco comment: trying to quit  Substance Use Topics  . Alcohol use: No    Alcohol/week: 0.0 standard drinks  . Drug use: No   Family History  Problem Relation Age of Onset  . Arthritis Mother   . Depression Mother   . Stroke Mother   . Arthritis Father   . Diabetes Father   . Coronary artery disease Father   . Colon cancer Neg Hx   . Prostate cancer Neg Hx   . Esophageal cancer Neg Hx   . Rectal cancer Neg Hx   . Stomach cancer Neg Hx    Allergies  Allergen Reactions  . Ambien [Zolpidem] Other (See Comments)    Parasomnias, sleep walking  . Lisinopril Cough  . Tramadol Diarrhea   Current Outpatient Medications on File Prior to Visit  Medication Sig Dispense Refill  . aspirin 81 MG chewable tablet Chew 81 mg by mouth at bedtime.    [name] Kitchen atorvastatin (LIPITOR) 40 MG tablet TAKE 1 TABLET (40 MG TOTAL) BY  MOUTH DAILY. NEEDS OFFICE VISIT 90 tablet 1  . carvedilol (COREG) 6.25 MG tablet TAKE 1 TABLET BY MOUTH TWICE A DAY 180 tablet 3  . clopidogrel (PLAVIX) 75 MG tablet Take 1 tablet (75 mg total) by mouth daily. 90 tablet 3  . cyclobenzaprine (FLEXERIL) 5 MG tablet Take 5 mg by mouth at bedtime as needed for muscle spasms.     . diclofenac sodium (VOLTAREN) 1 % GEL Apply 2-4 g topically daily as needed (joint pain).    [name] Kitchen glimepiride (AMARYL) 4 MG tablet TAKE 1 TABLET BY MOUTH TWICE A DAY 180 tablet 1  . glucose blood (ONE TOUCH ULTRA TEST) test strip Check sugar up to 6 times a day.  DM2, insulin treated, uncontrolled. 200 each 12  . insulin NPH Human (NOVOLIN N) 100 UNIT/ML injection INJECT 30 UNITS IN THE AM, 15 UNITS IN THE PM 10 mL 1  . Insulin Pen Needle 32G X 4 MM MISC Use daily with insulin pen. 100 each 3  . Insulin Syringe-Needle U-100 (INSULIN SYRINGE 1CC/31GX5/16") 31G X 5/16" 1 ML MISC Use with NPH insulin twice daily. 100 each 3  . losartan (COZAAR) 100 MG tablet TAKE 1 TABLET BY MOUTH EVERY DAY 90 tablet 2  . metFORMIN (GLUCOPHAGE) 1000 MG tablet TAKE 1 TABLET BY MOUTH TWICE A DAY WITH MEALS 180 tablet 1  . methocarbamol (ROBAXIN) 500 MG tablet Take 500 mg by mouth every 6 (six) hours as needed for muscle spasms.     . Multiple Vitamins-Minerals (AIRBORNE PO) Take 1 tablet by mouth. occas    . nitroGLYCERIN (NITROSTAT) 0.4 MG SL tablet Place 1 tablet (0.4 mg total) under the tongue every 5 (five) minutes as needed for chest pain. 25 tablet 3  . pantoprazole (PROTONIX) 40 MG tablet TAKE 1 TABLET BY MOUTH EVERY DAY 30 tablet 3  . tamsulosin (FLOMAX) 0.4 MG CAPS capsule Take 1 capsule (0.4 mg total) by mouth daily. 90 capsule 2   No current facility-administered medications on file prior to visit.    Review of Systems  Constitutional: Negative for activity change, appetite change, fatigue, fever and unexpected weight change.  HENT: Negative for congestion, rhinorrhea, sore throat  and  trouble swallowing.   Eyes: Negative for pain, redness, itching and visual disturbance.  Respiratory: Negative for cough, chest tightness, shortness of breath and wheezing.   Cardiovascular: Negative for chest pain and palpitations.  Gastrointestinal: Negative for abdominal pain, blood in stool, constipation, diarrhea and nausea.  Endocrine: Negative for cold intolerance, heat intolerance, polydipsia and polyuria.  Genitourinary: Negative for difficulty urinating, dysuria, frequency and urgency.  Musculoskeletal: Positive for arthralgias and back pain. Negative for joint swelling, myalgias and neck pain.  Skin: Negative for pallor and rash.  Neurological: Negative for dizziness, tremors, weakness, numbness and headaches.  Hematological: Negative for adenopathy. Does not bruise/bleed easily.  Psychiatric/Behavioral: Negative for decreased concentration and dysphoric mood. The patient is not nervous/anxious.        Objective:   Physical Exam Constitutional:      General: He is not in acute distress.    Appearance: Normal appearance. He is well-developed. He is obese. He is not ill-appearing.  HENT:     Head: Normocephalic and atraumatic.     Mouth/Throat:     Mouth: Mucous membranes are moist.  Eyes:     General: No scleral icterus.    Conjunctiva/sclera: Conjunctivae normal.     Pupils: Pupils are equal, round, and reactive to light.  Neck:     Vascular: No carotid bruit.  Cardiovascular:     Rate and Rhythm: Normal rate and regular rhythm.  Pulmonary:     Effort: Pulmonary effort is normal.     Breath sounds: Normal breath sounds. No wheezing or rales.  Abdominal:     General: Bowel sounds are normal. There is no distension.     Palpations: Abdomen is soft.     Tenderness: There is no abdominal tenderness. There is no right CVA tenderness or left CVA tenderness.     Comments: No suprapubic tenderness or fullness   No cva tenderness   Musculoskeletal:        General:  Tenderness present.     Cervical back: Normal range of motion and neck supple.     Lumbar back: Spasms and tenderness present. No swelling, edema, deformity or bony tenderness. Decreased range of motion. Negative right straight leg raise test and negative left straight leg raise test.     Comments: Some pain with int rotation of L hip (also trochanteric tenderness)  Tender in L lumbar musculature with some spasm  No bony tenderness today Flex 40 deg  Unable to ext LS due to pain  Lateral bend hurts to the R  Lymphadenopathy:     Cervical: No cervical adenopathy.  Skin:    General: Skin is warm and dry.     Coloration: Skin is not pale.     Findings: No erythema or rash.  Neurological:     Mental Status: He is alert.     Cranial Nerves: No cranial nerve deficit.     Sensory: No sensory deficit.     Motor: No atrophy or abnormal muscle tone.     Coordination: Coordination normal.     Deep Tendon Reflexes: Reflexes are normal and symmetric. Reflexes normal.     Comments: Negative SLR  Psychiatric:        Mood and Affect: Mood normal.        Judgment: Judgment normal.           Assessment & Plan:   Problem List Items Addressed This Visit      Other   Dysuria    ua  clear but concentrated  Enc pt co inc fluid level to make urine more dilute and update if no imp      Relevant Orders   POCT Urinalysis Dipstick (Automated) (Completed)   Left low back pain - Primary    This is acute on chronic after a fall (from ladder) 2 wk ago  Also felt rib contusion on R-better now  Was worried about kidney (ua is reassuring today) and has f/u with his urologist  Reviewed hx of low back pain and sciatica with pt-including imaging (mri per pt report), orthopedic visits, past PT and medications  No neuro change on exam today  Pt states that no muscle relaxers work for him -but amitriptyline did in the past (was sedating however)  Will px 10 mg to start with at bedtime -inst pt to update  with progress and consider gradually increasing dose  Enc pt to consider f/u with orthopedics as well

## 2020-01-29 NOTE — Assessment & Plan Note (Signed)
This is acute on chronic after a fall (from ladder) 2 wk ago  Also felt rib contusion on R-better now  Was worried about kidney (ua is reassuring today) and has f/u with his urologist  Reviewed hx of low back pain and sciatica with pt-including imaging (mri per pt report), orthopedic visits, past PT and medications  No neuro change on exam today  Pt states that no muscle relaxers work for him -but amitriptyline did in the past (was sedating however)  Will px 10 mg to start with at bedtime -inst pt to update with progress and consider gradually increasing dose  Enc pt to consider f/u with orthopedics as well

## 2020-02-06 ENCOUNTER — Encounter: Payer: Self-pay | Admitting: Family Medicine

## 2020-02-07 ENCOUNTER — Telehealth: Payer: Self-pay | Admitting: Family Medicine

## 2020-02-07 NOTE — Telephone Encounter (Signed)
Left message for patient to call back and schedule Medicare Annual Wellness Visit (AWV) with Nurse Health Advisor   This should be a telephone visit only.  Last AWV 02/16/13

## 2020-02-09 DIAGNOSIS — N201 Calculus of ureter: Secondary | ICD-10-CM | POA: Diagnosis not present

## 2020-02-09 DIAGNOSIS — R109 Unspecified abdominal pain: Secondary | ICD-10-CM | POA: Diagnosis not present

## 2020-02-13 ENCOUNTER — Ambulatory Visit: Payer: Medicare HMO | Admitting: Family Medicine

## 2020-02-15 ENCOUNTER — Ambulatory Visit: Payer: Medicare HMO | Admitting: Cardiology

## 2020-02-23 ENCOUNTER — Other Ambulatory Visit: Payer: Self-pay | Admitting: Family Medicine

## 2020-03-05 ENCOUNTER — Other Ambulatory Visit: Payer: Self-pay

## 2020-03-05 ENCOUNTER — Ambulatory Visit (INDEPENDENT_AMBULATORY_CARE_PROVIDER_SITE_OTHER): Payer: Medicare HMO | Admitting: Family Medicine

## 2020-03-05 ENCOUNTER — Ambulatory Visit (INDEPENDENT_AMBULATORY_CARE_PROVIDER_SITE_OTHER)
Admission: RE | Admit: 2020-03-05 | Discharge: 2020-03-05 | Disposition: A | Discharge status: Home / Self Care | Payer: Medicare HMO | Source: Ambulatory Visit | Attending: Family Medicine | Admitting: Family Medicine

## 2020-03-05 ENCOUNTER — Encounter: Payer: Self-pay | Admitting: Family Medicine

## 2020-03-05 VITALS — BP 116/76 | HR 83 | Temp 97.3°F | Ht 70.0 in | Wt 214.1 lb

## 2020-03-05 DIAGNOSIS — Z794 Long term (current) use of insulin: Secondary | ICD-10-CM | POA: Diagnosis not present

## 2020-03-05 DIAGNOSIS — R531 Weakness: Secondary | ICD-10-CM

## 2020-03-05 DIAGNOSIS — M1990 Unspecified osteoarthritis, unspecified site: Secondary | ICD-10-CM | POA: Diagnosis not present

## 2020-03-05 DIAGNOSIS — M545 Low back pain, unspecified: Secondary | ICD-10-CM

## 2020-03-05 DIAGNOSIS — R319 Hematuria, unspecified: Secondary | ICD-10-CM | POA: Diagnosis not present

## 2020-03-05 DIAGNOSIS — E1149 Type 2 diabetes mellitus with other diabetic neurological complication: Secondary | ICD-10-CM

## 2020-03-05 LAB — POC URINALSYSI DIPSTICK (AUTOMATED)
Bilirubin, UA: NEGATIVE
Glucose, UA: NEGATIVE
Ketones, UA: NEGATIVE
Leukocytes, UA: NEGATIVE
Nitrite, UA: NEGATIVE
Protein, UA: POSITIVE — AB
Spec Grav, UA: >=1.03 — AB (ref 1.010–1.025)
Urobilinogen, UA: 0.2 E.U./dL
pH, UA: 5.5 (ref 5.0–8.0)

## 2020-03-05 MED ORDER — DICLOFENAC SODIUM 1 % EX GEL: 2.0000 g | Freq: Four times a day (QID) | CUTANEOUS | 5 refills | Status: DC

## 2020-03-05 MED ORDER — CYCLOBENZAPRINE HCL 5 MG PO TABS: 5.0000 mg | ORAL_TABLET | Freq: Every evening | ORAL | Status: DC | PRN

## 2020-03-05 MED ORDER — INSULIN NPH (HUMAN) (ISOPHANE) 100 UNIT/ML ~~LOC~~ SUSP: SUBCUTANEOUS | Status: DC

## 2020-03-05 NOTE — Patient Instructions (Signed)
Let me check with neurology.   Go to the lab on the way out.   If you have mychart we'll likely use that to update you.     I'll work on a letter for the post office.    We'll go from there.   Take care.  Glad to see you.

## 2020-03-05 NOTE — Progress Notes (Signed)
This visit occurred during the SARS-CoV-2 public health emergency.  Safety protocols were in place, including screening questions prior to the visit, additional usage of staff PPE, and extensive cleaning of exam room while observing appropriate contact time as indicated for disinfecting solutions.  Back pain.  He'll have an instant jabbing severe pain, quick onset/ quick relief pain.  Always in the L lower back but not constant.  May happen a few times in a row.  No R sided pain.  Going on for about a few months.  This doesn't feel like a renal stone.  No dysuria.  Pain doesn't radiate to the legs.  Sensation on skin is normal.  No rash locally.  No symptoms at time of exam.  DM2.  Sugar ~150 recently.  20 units BID of 70/30.  Still on glimepiride and metformin.    He had prev R facial weakness and R arm and leg weakness, some days worse than others.  No more weakness in the face.  MRI was prev denied.  Discussed with patient.  He needed a letter to get his mailbox moved to his side of the road.  I told him I would work on that.  He is used Voltaren gel on his hands for joint pain.  It has been helpful.  He needed a refill done.  Completed at office visit.  Meds, vitals, and allergies reviewed.   ROS: Per HPI unless specifically indicated in ROS section   GEN: nad, alert and oriented HEENT: ncat NECK: supple w/o LA CV: rrr.  PULM: ctab, no inc wob ABD: soft, +bs, flank nontender to palpation.  Normal sensation. EXT: no edema SKIN: no acute rash His muscle tone is weaker on the right side compared to the left.  He is weaker at the biceps and also for hip flexion on the right side compared to the left.

## 2020-03-06 DIAGNOSIS — R531 Weakness: Secondary | ICD-10-CM | POA: Insufficient documentation

## 2020-03-06 LAB — CBC WITH DIFFERENTIAL/PLATELET
Basophils Absolute: 0.1 10*3/uL (ref 0.0–0.1)
Basophils Relative: 0.8 % (ref 0.0–3.0)
Eosinophils Absolute: 0.2 10*3/uL (ref 0.0–0.7)
Eosinophils Relative: 2.4 % (ref 0.0–5.0)
HCT: 39.1 % (ref 39.0–52.0)
Hemoglobin: 13.6 g/dL (ref 13.0–17.0)
Lymphocytes Relative: 21.8 % (ref 12.0–46.0)
Lymphs Abs: 1.8 10*3/uL (ref 0.7–4.0)
MCHC: 34.7 g/dL (ref 30.0–36.0)
MCV: 89.8 fl (ref 78.0–100.0)
Monocytes Absolute: 0.7 10*3/uL (ref 0.1–1.0)
Monocytes Relative: 8.5 % (ref 3.0–12.0)
Neutro Abs: 5.4 10*3/uL (ref 1.4–7.7)
Neutrophils Relative %: 66.5 % (ref 43.0–77.0)
Platelets: 209 10*3/uL (ref 150.0–400.0)
RBC: 4.36 Mil/uL (ref 4.22–5.81)
RDW: 13.6 % (ref 11.5–15.5)
WBC: 8.1 10*3/uL (ref 4.0–10.5)

## 2020-03-06 LAB — COMPREHENSIVE METABOLIC PANEL
ALT: 21 U/L (ref 0–53)
AST: 20 U/L (ref 0–37)
Albumin: 4.6 g/dL (ref 3.5–5.2)
Alkaline Phosphatase: 67 U/L (ref 39–117)
BUN: 18 mg/dL (ref 6–23)
CO2: 27 mEq/L (ref 19–32)
Calcium: 9.8 mg/dL (ref 8.4–10.5)
Chloride: 107 mEq/L (ref 96–112)
Creatinine, Ser: 1.43 mg/dL (ref 0.40–1.50)
GFR: 50.44 mL/min — ABNORMAL LOW (ref >60.00)
Glucose, Bld: 80 mg/dL (ref 70–99)
Potassium: 4.4 mEq/L (ref 3.5–5.1)
Sodium: 141 mEq/L (ref 135–145)
Total Bilirubin: 0.5 mg/dL (ref 0.2–1.2)
Total Protein: 7.5 g/dL (ref 6.0–8.3)

## 2020-03-06 LAB — LIPID PANEL
Cholesterol: 120 mg/dL (ref 0–200)
HDL: 43 mg/dL (ref >39.00)
LDL Cholesterol: 59 mg/dL (ref 0–99)
NonHDL: 76.88
Total CHOL/HDL Ratio: 3
Triglycerides: 89 mg/dL (ref 0.0–149.0)
VLDL: 17.8 mg/dL (ref 0.0–40.0)

## 2020-03-06 LAB — URINE CULTURE
MICRO NUMBER:: 10647402
SPECIMEN QUALITY:: ADEQUATE

## 2020-03-06 LAB — HEMOGLOBIN A1C: Hgb A1c MFr Bld: 7.8 % — ABNORMAL HIGH (ref 4.6–6.5)

## 2020-03-06 NOTE — Assessment & Plan Note (Signed)
Reasonable to continue Voltaren gel for his hands as needed.

## 2020-03-06 NOTE — Assessment & Plan Note (Signed)
He has nonacute right arm and right leg weakness compared to the left side and I need neurology input about follow-up imaging and intervention.  If he has any new neurologic symptoms then I want him to go to the hospital.  He agrees with plan.

## 2020-03-06 NOTE — Assessment & Plan Note (Signed)
No change in meds at this point.  See notes on labs.

## 2020-03-06 NOTE — Assessment & Plan Note (Signed)
Not typical for renal stone pain.  Still reasonable to check basic labs and urinalysis.  Check plain films of the lower back.  I question if he has a muscle spasm in his paraspinal muscles but is causing intermittent symptoms.

## 2020-03-07 ENCOUNTER — Other Ambulatory Visit: Payer: Self-pay | Admitting: Family Medicine

## 2020-03-07 ENCOUNTER — Telehealth: Payer: Self-pay

## 2020-03-07 ENCOUNTER — Ambulatory Visit: Payer: Medicare HMO | Admitting: Family Medicine

## 2020-03-07 NOTE — Telephone Encounter (Signed)
LMOVM, Please give [place] a call back Order for MRI was approved by insurance.

## 2020-03-07 NOTE — Telephone Encounter (Signed)
-----   Message from Pieter Partridge, DO sent at 03/07/2020  6:30 AM EDT ----- Vita Barley I had previously ordered a brain MRI for this patient for right facial droop, right facial numbness, right arm weakness, right leg weakness.  It was not performed and I received a note from his PCP stating it was apparently denied (I never received any information about this).  Could we look into this?

## 2020-03-11 ENCOUNTER — Telehealth: Payer: Self-pay | Admitting: Family Medicine

## 2020-03-11 NOTE — Telephone Encounter (Signed)
Please send copy of letter to patient after I sign it.  Please make sure it printed.  Thanks.

## 2020-03-12 ENCOUNTER — Encounter: Payer: Self-pay | Admitting: *Deleted

## 2020-03-12 ENCOUNTER — Encounter: Payer: Self-pay | Admitting: Family Medicine

## 2020-03-12 NOTE — Telephone Encounter (Signed)
Printed and signed.  

## 2020-03-22 DIAGNOSIS — G4733 Obstructive sleep apnea (adult) (pediatric): Secondary | ICD-10-CM | POA: Diagnosis not present

## 2020-04-04 DIAGNOSIS — N202 Calculus of kidney with calculus of ureter: Secondary | ICD-10-CM | POA: Diagnosis not present

## 2020-04-04 DIAGNOSIS — N201 Calculus of ureter: Secondary | ICD-10-CM | POA: Diagnosis not present

## 2020-04-16 ENCOUNTER — Ambulatory Visit: Payer: Medicare HMO

## 2020-04-16 ENCOUNTER — Other Ambulatory Visit: Payer: Self-pay

## 2020-04-16 ENCOUNTER — Telehealth: Payer: Self-pay

## 2020-04-16 ENCOUNTER — Other Ambulatory Visit: Payer: Medicare HMO

## 2020-04-16 NOTE — Telephone Encounter (Signed)
Called patient 3 times trying to complete medicare visit. Patient never answered. Left message on voicemail notifying patient appointment would be cancelled and to call office and reschedule.

## 2020-04-19 ENCOUNTER — Encounter: Payer: Medicare HMO | Admitting: Family Medicine

## 2020-04-25 DIAGNOSIS — N200 Calculus of kidney: Secondary | ICD-10-CM | POA: Diagnosis not present

## 2020-04-30 ENCOUNTER — Encounter: Payer: Self-pay | Admitting: Family Medicine

## 2020-04-30 ENCOUNTER — Ambulatory Visit (INDEPENDENT_AMBULATORY_CARE_PROVIDER_SITE_OTHER): Payer: Medicare HMO | Admitting: Family Medicine

## 2020-04-30 ENCOUNTER — Other Ambulatory Visit: Payer: Self-pay

## 2020-04-30 VITALS — BP 116/62 | HR 61 | Temp 96.8°F | Ht 70.0 in | Wt 212.4 lb

## 2020-04-30 DIAGNOSIS — R351 Nocturia: Secondary | ICD-10-CM | POA: Diagnosis not present

## 2020-04-30 DIAGNOSIS — Z125 Encounter for screening for malignant neoplasm of prostate: Secondary | ICD-10-CM

## 2020-04-30 DIAGNOSIS — R29810 Facial weakness: Secondary | ICD-10-CM

## 2020-04-30 LAB — POC URINALSYSI DIPSTICK (AUTOMATED)
Bilirubin, UA: NEGATIVE
Glucose, UA: POSITIVE — AB
Ketones, UA: NEGATIVE
Leukocytes, UA: NEGATIVE
Nitrite, UA: NEGATIVE
Protein, UA: POSITIVE — AB
Spec Grav, UA: 1.025 (ref 1.010–1.025)
Urobilinogen, UA: 0.2 E.U./dL
pH, UA: 6 (ref 5.0–8.0)

## 2020-04-30 LAB — PSA: PSA: 2.51 ng/mL (ref 0.10–4.00)

## 2020-04-30 MED ORDER — TAMSULOSIN HCL 0.4 MG PO CAPS: 0.8000 mg | ORAL_CAPSULE | Freq: Every day | ORAL | Status: DC

## 2020-04-30 NOTE — Progress Notes (Signed)
This visit occurred during the SARS-CoV-2 public health emergency.  Safety protocols were in place, including screening questions prior to the visit, additional usage of staff PPE, and extensive cleaning of exam room while observing appropriate contact time as indicated for disinfecting solutions.  He has cardiology f/u pending.    We talked about insulin use, sugar rarely low.  Occ elevations with lack of exercise.    Taking flomax but still with nocturia mult times per night.  No burning with urination.  Not having frequency in the day.  Sugar has been ~160.  No diuretic use.  No FCANVD.  No blood in urine except with a renal stone.  He isn't lightheaded on standing.  No FH prostate cancer.    Had seen Dr. Tomi Likens.  Per patient previous MRI was not done.  He was asking about options.  I told him I will check with neurology.  No new symptoms.  Meds, vitals, and allergies reviewed.   ROS: Per HPI unless specifically indicated in ROS section   GEN: nad, alert and oriented HEENT: ncat NECK: supple w/o LA CV: rrr.  PULM: ctab, no inc wob ABD: soft, +bs EXT: no edema SKIN: no acute rash

## 2020-04-30 NOTE — Patient Instructions (Signed)
Go to the lab on the way out.   If you have mychart we'll likely use that to update you. (urine and blood). Try taking 2 flomax and update me in about 10 days.  Take care.  Glad to see you.

## 2020-05-01 LAB — URINE CULTURE
MICRO NUMBER:: 10865006
SPECIMEN QUALITY:: ADEQUATE

## 2020-05-03 ENCOUNTER — Telehealth: Payer: Self-pay | Admitting: Family Medicine

## 2020-05-03 ENCOUNTER — Other Ambulatory Visit: Payer: Self-pay

## 2020-05-03 ENCOUNTER — Telehealth: Payer: Self-pay

## 2020-05-03 ENCOUNTER — Ambulatory Visit: Payer: Medicare HMO | Admitting: Cardiology

## 2020-05-03 ENCOUNTER — Encounter: Payer: Self-pay | Admitting: Cardiology

## 2020-05-03 VITALS — BP 131/73 | HR 60 | Resp 17 | Ht 70.0 in | Wt 213.0 lb

## 2020-05-03 DIAGNOSIS — E1122 Type 2 diabetes mellitus with diabetic chronic kidney disease: Secondary | ICD-10-CM

## 2020-05-03 DIAGNOSIS — E782 Mixed hyperlipidemia: Secondary | ICD-10-CM | POA: Diagnosis not present

## 2020-05-03 DIAGNOSIS — R351 Nocturia: Secondary | ICD-10-CM | POA: Insufficient documentation

## 2020-05-03 DIAGNOSIS — E1165 Type 2 diabetes mellitus with hyperglycemia: Secondary | ICD-10-CM | POA: Diagnosis not present

## 2020-05-03 DIAGNOSIS — I10 Essential (primary) hypertension: Secondary | ICD-10-CM

## 2020-05-03 DIAGNOSIS — IMO0002 Reserved for concepts with insufficient information to code with codable children: Secondary | ICD-10-CM

## 2020-05-03 DIAGNOSIS — N183 Chronic kidney disease, stage 3 unspecified: Secondary | ICD-10-CM | POA: Diagnosis not present

## 2020-05-03 DIAGNOSIS — I25118 Atherosclerotic heart disease of native coronary artery with other forms of angina pectoris: Secondary | ICD-10-CM

## 2020-05-03 NOTE — Assessment & Plan Note (Signed)
No new symptoms.  I am asking for neurology input regarding MRI.

## 2020-05-03 NOTE — Assessment & Plan Note (Signed)
Reasonable to check urine studies and PSA.  Increase Flomax to 2 tablets a day, 0.8 mg a day.  See notes on labs.  Okay for outpatient follow-up.

## 2020-05-03 NOTE — Progress Notes (Signed)
Primary Physician/Referring:  Tonia Ghent, MD  Patient ID: Tony Manning, male    DOB: Feb 01, 1961, 59 y.o.   MRN: [id number]  Chief Complaint  Patient presents with  . Hypertension  . Coronary Artery Disease  . Follow-up    1 year    HPI: Tony Manning  is a 59 y.o. male  with coronary artery disease status post PCI to RCA and LAD, long-standing degenerative disc disease and involving neck and shoulders, hyperlipidemia, OSA on CPAP, uncontrolled DM, hypertension presents for annual visit and follow-up of coronary disease, hypertension and hyperlipidemia.  Fortunately has not had recurrent episodes of angina recently.  Has used sublingual nitroglycerin occasionally.  States that overall he is feeling much better than he did a year ago and diabetes has improved.  Past Medical History:  Diagnosis Date  . Anginal pain ([place])   . Basal cell carcinoma of face   . Chest pain, unspecified   . Coronary atherosclerosis of native coronary artery    s/p stent x4  . Depression   . Diabetes mellitus without mention of complication   . Diverticulosis   . Dyspnea   . Dysrhythmia   . Erectile dysfunction   . Essential hypertension, benign   . GERD (gastroesophageal reflux disease)   . Heart murmur   . History of kidney stones   . History of nephrolithiasis   . Migraine   . Myocardial infarction ([place])   . Neuromuscular disorder ([place])   . Osteoarthritis   . Pneumonia   . Postsurgical percutaneous transluminal coronary angioplasty status    s/p stent x4  . Sarcoidosis   . Unspecified sleep apnea    uses C-pap    Past Surgical History:  Procedure Laterality Date  . ANTERIOR CERVICAL DECOMP/DISCECTOMY FUSION N/A 04/07/2017   Procedure: ANTERIOR CERVICAL DECOMPRESSION FUSION, CERVICAL 4-5 WITH INSTRUMENTATION AND ALLOGRAFT;  Surgeon: Phylliss Bob, MD;  Location: [facility];  Service: Orthopedics;  Laterality: N/A;  ANTERIOR CERVICAL DECOMPRESSION FUSION, CERVICAL 4-5 WITH  INSTRUMENTATION AND ALLOGRAFT; REQUEST 2.5 HOURS AND FLIP ROOM  . APPENDECTOMY  2005  . CARPAL TUNNEL RELEASE    . CERVICAL FUSION  2009  . CORONARY ANGIOPLASTY WITH STENT PLACEMENT    . CORONARY STENT INTERVENTION N/A 10/16/2016   Procedure: Coronary Stent Intervention;  Surgeon: Adrian Prows, MD;  Location: [facility];  Service: Cardiovascular;  Laterality: N/A;  . CYSTOSCOPY WITH RETROGRADE PYELOGRAM, URETEROSCOPY AND STENT PLACEMENT Right 05/12/2019   Procedure: CYSTOSCOPY WITH RIGHT RETROGRADE PYELOGRAM, URETEROSCOPY HOLMIUM LASER AND STENT PLACEMENT;  Surgeon: Lucas Mallow, MD;  Location: [facility];  Service: Urology;  Laterality: Right;  . ELBOW SURGERY     bilateral  . KNEE SURGERY     Bilateral   . LEFT HEART CATH AND CORONARY ANGIOGRAPHY N/A 10/16/2016   Procedure: Left Heart Cath and Coronary Angiography;  Surgeon: Adrian Prows, MD;  Location: [facility];  Service: Cardiovascular;  Laterality: N/A;  . LEFT HEART CATHETERIZATION WITH CORONARY ANGIOGRAM N/A 06/14/2014   Procedure: LEFT HEART CATHETERIZATION WITH CORONARY ANGIOGRAM;  Surgeon: Laverda Page, MD;  Location: [facility];  Service: Cardiovascular;  Laterality: N/A;  . LUNG BIOPSY     sarcoid  . ROTATOR CUFF REPAIR     left  . SHOULDER ARTHROSCOPY WITH SUBACROMIAL DECOMPRESSION Left 06/16/2018   Procedure: LEFT SHOULDER ARTHROSCOPY WITH ROTATOR CUFF DEBRIDEMENT AND  SUBACROMIAL DECOMPRESSION;  Surgeon: Tania Ade, MD;  Location: [facility];  Service: Orthopedics;  Laterality: Left;   Social History   Tobacco Use  . Smoking status: Never Smoker  . Smokeless tobacco: Former Systems developer    Types: Chew  . Tobacco comment: trying to quit  Substance Use Topics  . Alcohol use: No    Alcohol/week: 0.0 standard drinks   Marital Status: Married   Review of Systems  Cardiovascular: Positive for dyspnea on exertion (mild). Negative for chest pain and leg swelling.  Musculoskeletal: Positive for arthritis (right  knee) and back pain.  Gastrointestinal: Negative for melena.   Objective  Blood pressure 131/73, pulse 60, resp. rate 17, height 5\' 10"  (1.778 m), weight 213 lb (96.6 kg), SpO2 98 %. Body mass index is 30.56 kg/m.  Vitals with BMI 05/03/2020 04/30/2020 03/05/2020  Height 5\' 10"  5\' 10"  5\' 10"   Weight 213 lbs 212 lbs 7 oz 214 lbs 1 oz  BMI 30.56 22.48 25.00  Systolic 370 488 891  Diastolic 73 62 76  Pulse 60 61 83     Physical Exam Constitutional:      General: He is not in acute distress.    Appearance: He is well-developed.  Neck:     Thyroid: No thyromegaly.     Vascular: No JVD.  Cardiovascular:     Rate and Rhythm: Normal rate and regular rhythm.     Pulses: Intact distal pulses.     Heart sounds: Normal heart sounds. No murmur heard.  No gallop.      Comments: Trace edema Pulmonary:     Effort: Pulmonary effort is normal.     Breath sounds: Normal breath sounds.  Abdominal:     General: Bowel sounds are normal.     Palpations: Abdomen is soft.    Radiology: No results found.  Laboratory examination:   CMP Latest Ref Rng & Units 03/05/2020 05/29/2019 05/18/2019  Glucose 70 - 99 mg/dL 80 88 203(H)  BUN 6 - 23 mg/dL 18 20 29(H)  Creatinine 0.40 - 1.50 mg/dL 1.43 1.43 1.66(H)  Sodium 135 - 145 mEq/L 141 141 140  Potassium 3.5 - 5.1 mEq/L 4.4 4.7 4.7  Chloride 96 - 112 mEq/L 107 106 108  CO2 19 - 32 mEq/L 27 28 26   Calcium 8.4 - 10.5 mg/dL 9.8 10.1 9.6  Total Protein 6.0 - 8.3 g/dL 7.5 - -  Total Bilirubin 0.2 - 1.2 mg/dL 0.5 - -  Alkaline Phos 39 - 117 U/L 67 - -  AST 0 - 37 U/L 20 - -  ALT 0 - 53 U/L 21 - -   CBC Latest Ref Rng & Units 03/05/2020 05/29/2019 05/18/2019  WBC 4.0 - 10.5 K/uL 8.1 8.5 8.4  Hemoglobin 13.0 - 17.0 g/dL 13.6 13.7 12.8(L)  Hematocrit 39 - 52 % 39.1 40.8 38.3(L)  Platelets 150 - 400 K/uL 209.0 236.0 286.0   Lipid Panel Recent Labs    03/05/20 1446  CHOL 120  TRIG 89.0  LDLCALC 59  VLDL 17.8  HDL 43.00  CHOLHDL 3    HEMOGLOBIN  A1C Lab Results  Component Value Date   HGBA1C 7.8 (H) 03/05/2020   MPG 180.03 05/11/2019   TSH No results for input(s): TSH in the last 8760 hours.   Outpatient Medications Prior to Visit  Medication Sig Dispense Refill  . amitriptyline (ELAVIL) 10 MG tablet Take 1 tablet (10 mg total) by mouth at bedtime. 30 tablet 1  . aspirin 81 MG chewable tablet Chew 81 mg by mouth at bedtime.    [name] Kitchen atorvastatin (LIPITOR) 40  MG tablet TAKE 1 TABLET (40 MG TOTAL) BY MOUTH DAILY. NEEDS OFFICE VISIT 90 tablet 1  . carvedilol (COREG) 6.25 MG tablet TAKE 1 TABLET BY MOUTH TWICE A DAY 180 tablet 3  . cyclobenzaprine (FLEXERIL) 5 MG tablet Take 1 tablet (5 mg total) by mouth at bedtime as needed for muscle spasms.    . diclofenac Sodium (VOLTAREN) 1 % GEL Apply 2-4 g topically 4 (four) times daily. 100 g 5  . glimepiride (AMARYL) 4 MG tablet TAKE 1 TABLET BY MOUTH TWICE A DAY 180 tablet 1  . glucose blood (ONE TOUCH ULTRA TEST) test strip Check sugar up to 6 times a day.  DM2, insulin treated, uncontrolled. 200 each 12  . ibuprofen (ADVIL) 600 MG tablet Take 600 mg by mouth every 8 (eight) hours as needed.    . insulin NPH Human (NOVOLIN N) 100 UNIT/ML injection INJECT 20 UNITS TWICE A DAY    . Insulin Pen Needle 32G X 4 MM MISC Use daily with insulin pen. 100 each 3  . Insulin Syringe-Needle U-100 (INSULIN SYRINGE 1CC/31GX5/16") 31G X 5/16" 1 ML MISC Use with NPH insulin twice daily. 100 each 3  . losartan (COZAAR) 100 MG tablet TAKE 1 TABLET BY MOUTH EVERY DAY 90 tablet 2  . metFORMIN (GLUCOPHAGE) 1000 MG tablet TAKE 1 TABLET BY MOUTH TWICE A DAY WITH MEALS 180 tablet 1  . nitroGLYCERIN (NITROSTAT) 0.4 MG SL tablet Place 1 tablet (0.4 mg total) under the tongue every 5 (five) minutes as needed for chest pain. 25 tablet 3  . ondansetron (ZOFRAN) 4 MG tablet Take 4 mg by mouth every 8 (eight) hours as needed.    [name] Kitchen oxyCODONE (OXY IR/ROXICODONE) 5 MG immediate release tablet Take 5 mg by mouth every 6 (six)  hours as needed.    . pantoprazole (PROTONIX) 40 MG tablet TAKE 1 TABLET BY MOUTH EVERY DAY 30 tablet 3  . tamsulosin (FLOMAX) 0.4 MG CAPS capsule Take 2 capsules (0.8 mg total) by mouth daily.    . clopidogrel (PLAVIX) 75 MG tablet Take 1 tablet (75 mg total) by mouth daily. 90 tablet 3  . Multiple Vitamins-Minerals (AIRBORNE PO) Take 1 tablet by mouth. occas     No facility-administered medications prior to visit.   Cardiac Studies:   Lexiscan myoview stress test 01/07/2018: 1. Lexiscan stress test performed. Exercise capacity was not assessed. Stress symptoms included dyspnea, flushing. Normal blood pressure. The resting electrocardiogram demonstrated normal sinus rhythm, normal resting conduction, no resting arrhythmias and normal rest repolarization. Stress EKG is non diagnostic for ischemia as it is a pharmacologic stress. 2. The overall quality of the study is excellent. There is no evidence of abnormal lung activity. Stress and rest SPECT images demonstrate homogeneous tracer distribution throughout the myocardium. Gated SPECT imaging reveals normal myocardial thickening and wall motion. The left ventricular ejection fraction was normal (60%).  3. Low risk study.  Echocardiogram 02/01/2018: Left ventricle cavity is normal in size. Normal global wall motion. Indeterminate diastolic filling pattern, indeterminate LAP. Calculated EF 55%. No significant valvular abnormality. Inadequate tricuspid regurgitation jet to estimate pulmonary artery pressure. Normal right atrial pressure.  Coronary angiogram 10/16/2016: 3.0 x 38 mm onyx DES Prox RCA. 06/14/2014: Thrombectomy of the mid RCA & Stenting with 3.0 x 18 mm Xience Alpine DES. Stenting mid LAD 3.0 x 18 mm Xience Alpine DES. Patent Mid LAD 2.75x12 mm Taxus, 2.5x12 Distal RCA stent x 2 from 2004 and 2007. Normal LVEF. Diffuse CAD.  EKG:  EKG 05/03/2020: Normal sinus rhythm at rate of 56 bpm, normal axis.  Poor R wave progression, probably  normal variant.  Normal EKG.    Assessment   Coronary artery disease of native artery of native heart with stable angina pectoris ([place]) - Plan: EKG 12-Lead  Essential hypertension, benign  Mixed hyperlipidemia  Uncontrolled diabetes with stage 3 chronic kidney disease GFR 30-59 (HCC)  Medications Discontinued During This Encounter  Medication Reason  . Multiple Vitamins-Minerals (AIRBORNE PO) Patient Preference  . clopidogrel (PLAVIX) 75 MG tablet Completed Course    No orders of the defined types were placed in this encounter.   Recommendations:   Tony Manning  is a 59 y.o. male  with coronary artery disease status post PCI to RCA and LAD, long-standing degenerative disc disease and involving neck and shoulders, hyperlipidemia, OSA on CPAP, uncontrolled DM, hypertension presents for annual visit and follow-up of coronary disease, hypertension and hyperlipidemia.  He is presently doing well and has not had any significant anginal episodes, blood pressure is well controlled and lipids are at goal.  Only continued risk factor is uncontrolled diabetes with stage III chronic kidney disease.  Fortunately he has not had any recent hospitalization.  He is presently on dual antiplatelet therapy I will discontinue Plavix as he has completed the course.  I will see him back on annual basis.  Encouraged him to lose weight and also to have his diabetes controlled.   Adrian Prows, MD, [facility] 05/05/2020, 1:21 PM Office: [phone]

## 2020-05-03 NOTE — Telephone Encounter (Signed)
Patient advised.

## 2020-05-03 NOTE — Telephone Encounter (Signed)
See below.  [facility] prev contacted patient.  Pt can call them back to schedule.  Thanks.

## 2020-05-03 NOTE — Telephone Encounter (Signed)
-----   Message from Pieter Partridge, DO sent at 05/03/2020  4:14 PM EDT ----- Regarding: RE: MRI status [facility] called him 4 times.  He never responded or called back ----- Message ----- From: Tonia Ghent, MD Sent: 05/03/2020   2:16 PM EDT To: Pieter Partridge, DO Subject: MRI status                                     What is the status of his follow-up MRI?  I saw him in clinic recently and he was asking about this.   I appreciate your help.  Brigitte Pulse

## 2020-05-08 NOTE — Telephone Encounter (Signed)
error 

## 2020-05-10 DIAGNOSIS — N200 Calculus of kidney: Secondary | ICD-10-CM | POA: Diagnosis not present

## 2020-05-17 ENCOUNTER — Ambulatory Visit (INDEPENDENT_AMBULATORY_CARE_PROVIDER_SITE_OTHER): Payer: Medicare HMO | Admitting: Family Medicine

## 2020-05-17 ENCOUNTER — Encounter: Payer: Self-pay | Admitting: Family Medicine

## 2020-05-17 ENCOUNTER — Other Ambulatory Visit: Payer: Self-pay

## 2020-05-17 DIAGNOSIS — R351 Nocturia: Secondary | ICD-10-CM | POA: Diagnosis not present

## 2020-05-17 MED ORDER — OXYBUTYNIN CHLORIDE 5 MG PO TABS: 5.0000 mg | ORAL_TABLET | Freq: Every day | ORAL | 1 refills | Status: DC

## 2020-05-17 NOTE — Patient Instructions (Signed)
Try adding on oxybutynin at night and see if that helps.  If not then I will need input from urology.  I would continue flomax as is for now.  Take care.  Glad to see you.

## 2020-05-17 NOTE — Progress Notes (Signed)
This visit occurred during the SARS-CoV-2 public health emergency.  Safety protocols were in place, including screening questions prior to the visit, additional usage of staff PPE, and extensive cleaning of exam room while observing appropriate contact time as indicated for disinfecting solutions.  D/w pt about imaging.  He can call to f/u with the imaging site for scheduling.    He passed a renal stone in the meantime.  That likely explained the blood seen in his urine.  D/w pt about getting stone analysis done through urology.  He has a strainer to use.    Recent PSA 2.5, wnl.   Still with nocturia q2 hours.  No help with higher dose of flomax, ie still with nocturia.  No burning with urination.  His stream is still okay at this point.  He cut back on fluid in the PMs.  No diuretics taken at night.      Sugar was 117 this AM, 107 yesterday.  He doesn't have hyperglycemia documented that would typically cause the nocturia.  Discussed.   Meds, vitals, and allergies reviewed.   ROS: Per HPI unless specifically indicated in ROS section   GEN: nad, alert and oriented HEENT: ncat NECK: supple w/o LA CV: rrr PULM: ctab, no inc wob ABD: soft, +bs EXT: no edema SKIN: no acute rash

## 2020-05-22 NOTE — Assessment & Plan Note (Signed)
Discussed options. I would try adding on oxybutynin at night and see if that helps.  If not then I will need input from urology.  I would continue flomax as is for now.  He agrees with plan.  He will update me as needed.

## 2020-05-23 ENCOUNTER — Telehealth: Payer: Self-pay

## 2020-05-23 ENCOUNTER — Other Ambulatory Visit: Payer: Self-pay | Admitting: Family Medicine

## 2020-05-23 DIAGNOSIS — Z20822 Contact with and (suspected) exposure to covid-19: Secondary | ICD-10-CM | POA: Diagnosis not present

## 2020-05-23 NOTE — Telephone Encounter (Signed)
Tamsulosin increase to 2 a day is working well. Needs new rx sent to [facility]. Please call [phone] with any questions.

## 2020-05-24 MED ORDER — TAMSULOSIN HCL 0.4 MG PO CAPS
0.8000 mg | ORAL_CAPSULE | Freq: Every day | ORAL | 1 refills | Status: DC
Start: 2020-05-24 — End: 2020-06-06

## 2020-05-24 NOTE — Telephone Encounter (Signed)
Sent. Thanks.   

## 2020-05-27 DIAGNOSIS — R1084 Generalized abdominal pain: Secondary | ICD-10-CM | POA: Diagnosis not present

## 2020-05-27 DIAGNOSIS — N200 Calculus of kidney: Secondary | ICD-10-CM | POA: Diagnosis not present

## 2020-05-27 DIAGNOSIS — I700 Atherosclerosis of aorta: Secondary | ICD-10-CM | POA: Diagnosis not present

## 2020-05-27 DIAGNOSIS — R69 Illness, unspecified: Secondary | ICD-10-CM | POA: Diagnosis not present

## 2020-05-27 DIAGNOSIS — N1330 Unspecified hydronephrosis: Secondary | ICD-10-CM | POA: Diagnosis not present

## 2020-05-27 DIAGNOSIS — N202 Calculus of kidney with calculus of ureter: Secondary | ICD-10-CM | POA: Diagnosis not present

## 2020-05-27 DIAGNOSIS — R310 Gross hematuria: Secondary | ICD-10-CM | POA: Diagnosis not present

## 2020-05-28 ENCOUNTER — Other Ambulatory Visit: Payer: Self-pay | Admitting: Urology

## 2020-05-29 DIAGNOSIS — N23 Unspecified renal colic: Secondary | ICD-10-CM | POA: Diagnosis not present

## 2020-05-29 DIAGNOSIS — N202 Calculus of kidney with calculus of ureter: Secondary | ICD-10-CM | POA: Diagnosis not present

## 2020-06-04 ENCOUNTER — Other Ambulatory Visit: Payer: Self-pay

## 2020-06-04 ENCOUNTER — Other Ambulatory Visit
Admission: RE | Admit: 2020-06-04 | Discharge: 2020-06-04 | Disposition: A | Discharge status: Home / Self Care | Payer: Medicare HMO | Source: Ambulatory Visit | Attending: Urology | Admitting: Urology

## 2020-06-04 DIAGNOSIS — Z20822 Contact with and (suspected) exposure to covid-19: Secondary | ICD-10-CM | POA: Insufficient documentation

## 2020-06-04 DIAGNOSIS — Z01812 Encounter for preprocedural laboratory examination: Secondary | ICD-10-CM | POA: Insufficient documentation

## 2020-06-04 LAB — SARS CORONAVIRUS 2 (TAT 6-24 HRS): SARS Coronavirus 2: NEGATIVE

## 2020-06-05 DIAGNOSIS — N202 Calculus of kidney with calculus of ureter: Secondary | ICD-10-CM | POA: Diagnosis not present

## 2020-06-05 DIAGNOSIS — N201 Calculus of ureter: Secondary | ICD-10-CM | POA: Diagnosis not present

## 2020-06-05 DIAGNOSIS — N23 Unspecified renal colic: Secondary | ICD-10-CM | POA: Diagnosis not present

## 2020-06-06 ENCOUNTER — Other Ambulatory Visit: Payer: Self-pay | Admitting: Family Medicine

## 2020-06-07 ENCOUNTER — Ambulatory Visit (HOSPITAL_BASED_OUTPATIENT_CLINIC_OR_DEPARTMENT_OTHER): Admit: 2020-06-07 | Payer: Medicare HMO | Admitting: Urology

## 2020-06-07 SURGERY — CYSTOSCOPY/URETEROSCOPY/HOLMIUM LASER/STENT PLACEMENT
Anesthesia: General | Laterality: Left

## 2020-06-21 ENCOUNTER — Telehealth: Payer: Self-pay

## 2020-06-21 NOTE — Telephone Encounter (Addendum)
Noted! Thank you

## 2020-06-21 NOTE — Telephone Encounter (Signed)
Per note by Larene Beach RN she has already spoke with pt.

## 2020-06-21 NOTE — Telephone Encounter (Signed)
Received call to the clinic from Access Nurse reporting pt called and was triaged with disposition to go to ER immediately for chest burning and pt has refused. They want nurse in this office to talk to pt.   Pt c/o intermittent chest burning from hx or sardoidosis. He describes the chest burn as a bad strain and comes and goes at night and during the day and is under both breasts. He reported he almost always has this burning but it has been getting consistently getting worse over the last several months. He reports PCP has treated this before and there is no reason for him to go to the ER for this and that is what he has been told by PCP in the past. Pt denies any sharp chest pains, jaw or arm pains, SOB or radiating pain. Pt requested the soonest available apt with PCP but said it did not have to be today. Advised next apt is 10/21. Pt agreed to that apt. Advised pt to contact office if any symptoms worsened and if he developed any new symptoms. Advised of ER precautions. Pt verbalized understanding.   Scheduled pt for 10/21

## 2020-06-21 NOTE — Telephone Encounter (Signed)
[facility] - Client TELEPHONE ADVICE RECORD AccessNurse Patient Name: Tony Manning Gender: Male DOB: Mar 12, 1961 Age: 59 Y 3 M 17 D Return Phone Number: [phone] (Primary), [phone] (Secondary) Address: City/State/Zip: [place] [address] Client [facility] - Client Client Site [facility] - Day Physician Renford Dills - MD Contact Type Call Who Is Calling Patient / Member / Family / Caregiver Call Type Triage / Clinical Relationship To Patient Self Return Phone Number [phone] (Secondary) Chief Complaint BREATHING - shortness of breath or sounds breathless Reason for Call Symptomatic / Request for [facility] caller states it hurts to breathe, burns, and his chest hurts Translation No Nurse Assessment Nurse: Cherre Robins, RN, Ria Comment Date/Time (Eastern Time): 06/21/2020 1:18:46 PM Confirm and document reason for call. If symptomatic, describe symptoms. ---Caller states he has chest pain that burns and it hurts to breath. States he sees a cardiologist. No fever. Does the patient have any new or worsening symptoms? ---Yes Will a triage be completed? ---Yes Related visit to physician within the last 2 weeks? ---No Does the PT have any chronic conditions? (i.e. diabetes, asthma, this includes High risk factors for pregnancy, etc.) ---Yes List chronic conditions. ---hx of heart attack and has five heart stents, diabetes Is this a behavioral health or substance abuse call? ---No Guidelines Guideline Title Affirmed Question Affirmed Notes Nurse Date/Time (Eastern Time) Chest Pain [1] Chest pain lasts > 5 minutes AND [2] age > 103 Weiss-Hilton, RN, Ria Comment 06/21/2020 1:20:47 PM Disp. Time Eilene Ghazi Time) Disposition Final User 06/21/2020 1:17:43 PM Send to Urgent Queue Birdena Jubilee 06/21/2020 1:22:46 PM Call EMS 911 Now Yes Weiss-Hilton, RN, Ivonne Andrew  Disagree/Comply Disagree Caller Understands Yes PLEASE NOTE: All timestamps contained within this report are represented as [place] Standard Time. CONFIDENTIALTY NOTICE: This fax transmission is intended only for the addressee. It contains information that is legally privileged, confidential or otherwise protected from use or disclosure. If you are not the intended recipient, you are strictly prohibited from reviewing, disclosing, copying using or disseminating any of this information or taking any action in reliance on or regarding this information. If you have received this fax in error, please notify us immediately by telephone so that we can arrange for its return to [place]. Phone: [phone], Toll-Free: [phone], Fax: [phone] Page: 2 of 2 Call Id: [id number] PreDisposition InappropriateToAsk Care Advice Given Per Guideline CALL EMS 911 NOW: * Immediate medical attention is needed. You need to hang up and call 911 (or an ambulance). * Triager Discretion: I'll call you back in a few minutes to be sure you were able to reach them. CARE ADVICE given per Chest Pain (Adult) guideline. Comments User: Carolan Clines, RN Date/Time Eilene Ghazi Time): 06/21/2020 1:23:35 PM Caller states he will not call 911 bc he doesn't need to and it's nothing do to with his heart and he is requesting to speak with the office to schedule and appt with his PCP stating his PCP is familiar with his conditions. User: Carolan Clines, RN Date/Time Eilene Ghazi Time): 06/21/2020 1:26:35 PM RN attempted to contact the back line three times consecutively and it rang for 40 seconds and then disconnected with a fast busy signal. User: Carolan Clines, RN Date/Time Eilene Ghazi Time): 06/21/2020 1:33:23 PM RN contacted the main office nurmber due to being unable to reach anyone via the backline number and spoke with Alice and updated her on caller's info and final dispo. She stated she will give the  message to  her clinical supervisor who will contact the caller directly.

## 2020-06-24 NOTE — Telephone Encounter (Signed)
Noted. Thanks.

## 2020-06-27 ENCOUNTER — Other Ambulatory Visit: Payer: Self-pay

## 2020-06-27 ENCOUNTER — Encounter: Payer: Self-pay | Admitting: Family Medicine

## 2020-06-27 ENCOUNTER — Ambulatory Visit (INDEPENDENT_AMBULATORY_CARE_PROVIDER_SITE_OTHER): Payer: Medicare HMO | Admitting: Family Medicine

## 2020-06-27 DIAGNOSIS — R079 Chest pain, unspecified: Secondary | ICD-10-CM
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
## 2020-07-01 DIAGNOSIS — Z20822 Contact with and (suspected) exposure to covid-19: Secondary | ICD-10-CM | POA: Diagnosis not present

## 2020-07-03 ENCOUNTER — Other Ambulatory Visit: Payer: Self-pay | Admitting: Cardiology

## 2020-07-08 ENCOUNTER — Ambulatory Visit: Payer: Medicare HMO | Admitting: Family Medicine

## 2020-07-11 ENCOUNTER — Ambulatory Visit (INDEPENDENT_AMBULATORY_CARE_PROVIDER_SITE_OTHER): Payer: Medicare HMO | Admitting: Family Medicine

## 2020-07-11 ENCOUNTER — Ambulatory Visit (INDEPENDENT_AMBULATORY_CARE_PROVIDER_SITE_OTHER)
Admission: RE | Admit: 2020-07-11 | Discharge: 2020-07-11 | Disposition: A | Discharge status: Home / Self Care | Payer: Medicare HMO | Source: Ambulatory Visit | Attending: Family Medicine | Admitting: Family Medicine

## 2020-07-11 ENCOUNTER — Other Ambulatory Visit: Payer: Self-pay

## 2020-07-11 ENCOUNTER — Encounter: Payer: Self-pay | Admitting: Family Medicine

## 2020-07-11 VITALS — BP 140/78 | HR 60 | Ht 70.0 in | Wt 214.0 lb

## 2020-07-11 DIAGNOSIS — R079 Chest pain, unspecified: Secondary | ICD-10-CM

## 2020-07-11 DIAGNOSIS — E1149 Type 2 diabetes mellitus with other diabetic neurological complication: Secondary | ICD-10-CM

## 2020-07-11 DIAGNOSIS — Z794 Long term (current) use of insulin: Secondary | ICD-10-CM

## 2020-07-11 LAB — POCT GLYCOSYLATED HEMOGLOBIN (HGB A1C): Hemoglobin A1C: 7.6 % — AB (ref 4.0–5.6)

## 2020-07-11 MED ORDER — INSULIN NPH (HUMAN) (ISOPHANE) 100 UNIT/ML ~~LOC~~ SUSP
SUBCUTANEOUS | Status: DC
Start: 2020-07-11 — End: 2020-07-29
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
## 2020-07-11 NOTE — Patient Instructions (Signed)
Go to the lab on the way out.   If you have mychart we'll likely use that to update you.    Take care.  Glad to see you. 

## 2020-07-12 ENCOUNTER — Encounter: Payer: Self-pay | Admitting: Student

## 2020-07-12 ENCOUNTER — Ambulatory Visit: Payer: Medicare HMO | Admitting: Student

## 2020-07-12 ENCOUNTER — Telehealth: Payer: Self-pay | Admitting: *Deleted

## 2020-07-12 ENCOUNTER — Other Ambulatory Visit: Payer: Self-pay

## 2020-07-12 VITALS — BP 136/61 | HR 66 | Ht 70.0 in | Wt 215.0 lb

## 2020-07-12 DIAGNOSIS — I25118 Atherosclerotic heart disease of native coronary artery with other forms of angina pectoris: Secondary | ICD-10-CM

## 2020-07-12 DIAGNOSIS — I10 Essential (primary) hypertension: Secondary | ICD-10-CM | POA: Diagnosis not present

## 2020-07-12 DIAGNOSIS — R079 Chest pain, unspecified: Secondary | ICD-10-CM | POA: Diagnosis not present

## 2020-07-12 MED ORDER — ISOSORBIDE MONONITRATE ER 30 MG PO TB24: 30.0000 mg | ORAL_TABLET | Freq: Every day | ORAL | 3 refills | Status: DC

## 2020-07-12 NOTE — Telephone Encounter (Signed)
Patient left a voicemail stating that he saw Dr. [name] yesterday and had a chest x-ray done. Patient stated that he had chest pain last night and is on his way to his cardiologist now.Patient wants to know what was found on his chest x-ray and to share the results with his cardiologist.

## 2020-07-12 NOTE — Telephone Encounter (Signed)
Spoke with patient and updated him as instructed per Dr. [name]. Patient verbalized understanding. Instructed patient to reach out to office with any further concerns. No questions or concerns stated by patient at this time.

## 2020-07-12 NOTE — Telephone Encounter (Signed)
CXR is normal.  A1c slightly better.  I saw cardiology was adding on imdur.  I would make one change at a time and follow cards recs.  He is going to have f/u with cards again soon and I'll await those notes . Thanks.

## 2020-07-12 NOTE — Progress Notes (Signed)
Primary Physician/Referring:  Tonia Ghent, MD  Patient ID: Tony Manning, male    DOB: 04-19-1961, 59 y.o.   MRN: [id number]  Chief Complaint  Patient presents with  . Coronary Artery Disease    pt c/o chest pain  . Follow-up    HPI: Tony Manning  is a 59 y.o. male  with coronary artery disease status post PCI to RCA and LAD, long-standing degenerative disc disease and involving neck and shoulders, hyperlipidemia, OSA on CPAP, uncontrolled DM, hypertension presents for annual visit and follow-up of coronary disease, hypertension and hyperlipidemia.   Patient presents for urgent visit with complaints of chest pressure and left arm pain worsening last night.  He states that since his last office visit in August he has had more frequent anginal episodes as well as mild left arm pain over the last couple weeks.  Last night his chest pressure increased in severity to 5/10 and left arm pain got worse.  He took sublingual nitroglycerin, which provided some relief for about 1 hour, he then had to take another which again provided some relief.  This morning chest pressure and left arm pain have improved but continue to be bothersome.  Last night he also had an episode of shortness of breath.  Patient symptoms last night presented while he was at rest.  Denies diaphoresis, nausea, vomiting, dizziness, syncope, near syncope.  He states symptoms do not get worse with exertion.  Of note his diabetes is still uncontrolled, and he did start a part-time job moving freight approximately 3 weeks ago.   Past Medical History:  Diagnosis Date  . Anginal pain ([place])   . Basal cell carcinoma of face   . Chest pain, unspecified   . Coronary atherosclerosis of native coronary artery    s/p stent x4  . Depression   . Diabetes mellitus without mention of complication   . Diverticulosis   . Dyspnea   . Dysrhythmia   . Erectile dysfunction   . Essential hypertension, benign   . GERD  (gastroesophageal reflux disease)   . Heart murmur   . History of kidney stones   . History of nephrolithiasis   . Migraine   . Myocardial infarction ([place])   . Neuromuscular disorder ([place])   . Osteoarthritis   . Pneumonia   . Postsurgical percutaneous transluminal coronary angioplasty status    s/p stent x4  . Sarcoidosis   . Unspecified sleep apnea    uses C-pap    Past Surgical History:  Procedure Laterality Date  . ANTERIOR CERVICAL DECOMP/DISCECTOMY FUSION N/A 04/07/2017   Procedure: ANTERIOR CERVICAL DECOMPRESSION FUSION, CERVICAL 4-5 WITH INSTRUMENTATION AND ALLOGRAFT;  Surgeon: Phylliss Bob, MD;  Location: [facility];  Service: Orthopedics;  Laterality: N/A;  ANTERIOR CERVICAL DECOMPRESSION FUSION, CERVICAL 4-5 WITH INSTRUMENTATION AND ALLOGRAFT; REQUEST 2.5 HOURS AND FLIP ROOM  . APPENDECTOMY  2005  . CARPAL TUNNEL RELEASE    . CERVICAL FUSION  2009  . CORONARY ANGIOPLASTY WITH STENT PLACEMENT    . CORONARY STENT INTERVENTION N/A 10/16/2016   Procedure: Coronary Stent Intervention;  Surgeon: Adrian Prows, MD;  Location: [facility];  Service: Cardiovascular;  Laterality: N/A;  . CYSTOSCOPY WITH RETROGRADE PYELOGRAM, URETEROSCOPY AND STENT PLACEMENT Right 05/12/2019   Procedure: CYSTOSCOPY WITH RIGHT RETROGRADE PYELOGRAM, URETEROSCOPY HOLMIUM LASER AND STENT PLACEMENT;  Surgeon: Lucas Mallow, MD;  Location: [facility];  Service: Urology;  Laterality: Right;  . ELBOW SURGERY     bilateral  .  KNEE SURGERY     Bilateral   . LEFT HEART CATH AND CORONARY ANGIOGRAPHY N/A 10/16/2016   Procedure: Left Heart Cath and Coronary Angiography;  Surgeon: Adrian Prows, MD;  Location: [facility];  Service: Cardiovascular;  Laterality: N/A;  . LEFT HEART CATHETERIZATION WITH CORONARY ANGIOGRAM N/A 06/14/2014   Procedure: LEFT HEART CATHETERIZATION WITH CORONARY ANGIOGRAM;  Surgeon: Laverda Page, MD;  Location: [facility];  Service: Cardiovascular;  Laterality: N/A;  . LUNG BIOPSY      sarcoid  . ROTATOR CUFF REPAIR     left  . SHOULDER ARTHROSCOPY WITH SUBACROMIAL DECOMPRESSION Left 06/16/2018   Procedure: LEFT SHOULDER ARTHROSCOPY WITH ROTATOR CUFF DEBRIDEMENT AND  SUBACROMIAL DECOMPRESSION;  Surgeon: Tania Ade, MD;  Location: [facility];  Service: Orthopedics;  Laterality: Left;   Social History   Tobacco Use  . Smoking status: Never Smoker  . Smokeless tobacco: Former Systems developer    Types: Chew  . Tobacco comment: trying to quit  Substance Use Topics  . Alcohol use: No    Alcohol/week: 0.0 standard drinks   Marital Status: Married   ROS  Review of Systems  Constitutional: Negative for malaise/fatigue and weight gain.  Cardiovascular: Positive for chest pain and dyspnea on exertion (mild). Negative for claudication, leg swelling, near-syncope, orthopnea, palpitations, paroxysmal nocturnal dyspnea and syncope.  Hematologic/Lymphatic: Does not bruise/bleed easily.  Musculoskeletal: Positive for arthritis (right knee) and back pain.       Left upper arm pain   Gastrointestinal: Negative for melena.  Neurological: Negative for dizziness and weakness.   Objective  Blood pressure 136/61, pulse 66, height 5\' 10"  (1.778 m), weight 215 lb (97.5 kg), SpO2 98 %. Body mass index is 30.85 kg/m.  Vitals with BMI 07/12/2020 07/11/2020 06/27/2020  Height 5\' 10"  5\' 10"  5\' 10"   Weight 215 lbs 214 lbs 216 lbs 4 oz  BMI 30.85 57.01 77.93  Systolic 903 009 233  Diastolic 61 78 60  Pulse 66 60 67     Physical Exam Vitals reviewed.  Constitutional:      General: He is not in acute distress.    Appearance: He is well-developed.  HENT:     Head: Normocephalic and atraumatic.  Neck:     Thyroid: No thyromegaly.     Vascular: No JVD.  Cardiovascular:     Rate and Rhythm: Normal rate and regular rhythm.     Pulses: Intact distal pulses.     Heart sounds: Normal heart sounds, S1 normal and S2 normal. No murmur heard.  No gallop.   Pulmonary:     Effort: Pulmonary effort is  normal. No respiratory distress.     Breath sounds: Normal breath sounds. No wheezing, rhonchi or rales.  Abdominal:     General: Bowel sounds are normal.     Palpations: Abdomen is soft.  Musculoskeletal:        General: Tenderness (Along sternum, ribs, and left arm) present. Normal range of motion.     Right lower leg: No edema.     Left lower leg: No edema.  Neurological:     Mental Status: He is alert.    Laboratory examination:   CMP Latest Ref Rng & Units 03/05/2020 05/29/2019 05/18/2019  Glucose 70 - 99 mg/dL 80 88 203(H)  BUN 6 - 23 mg/dL 18 20 29(H)  Creatinine 0.40 - 1.50 mg/dL 1.43 1.43 1.66(H)  Sodium 135 - 145 mEq/L 141 141 140  Potassium 3.5 - 5.1 mEq/L 4.4 4.7 4.7  Chloride 96 - 112 mEq/L 107 106 108  CO2 19 - 32 mEq/L 27 28 26   Calcium 8.4 - 10.5 mg/dL 9.8 10.1 9.6  Total Protein 6.0 - 8.3 g/dL 7.5 - -  Total Bilirubin 0.2 - 1.2 mg/dL 0.5 - -  Alkaline Phos 39 - 117 U/L 67 - -  AST 0 - 37 U/L 20 - -  ALT 0 - 53 U/L 21 - -   CBC Latest Ref Rng & Units 03/05/2020 05/29/2019 05/18/2019  WBC 4.0 - 10.5 K/uL 8.1 8.5 8.4  Hemoglobin 13.0 - 17.0 g/dL 13.6 13.7 12.8(L)  Hematocrit 39 - 52 % 39.1 40.8 38.3(L)  Platelets 150 - 400 K/uL 209.0 236.0 286.0   Lipid Panel Recent Labs    03/05/20 1446  CHOL 120  TRIG 89.0  LDLCALC 59  VLDL 17.8  HDL 43.00  CHOLHDL 3    HEMOGLOBIN A1C Lab Results  Component Value Date   HGBA1C 7.6 (A) 07/11/2020   MPG 180.03 05/11/2019   TSH No results for input(s): TSH in the last 8760 hours.   Outpatient Medications Prior to Visit  Medication Sig Dispense Refill  . amitriptyline (ELAVIL) 10 MG tablet Take 1 tablet (10 mg total) by mouth at bedtime. 30 tablet 1  . aspirin 81 MG chewable tablet Chew 81 mg by mouth at bedtime.    [name] Kitchen atorvastatin (LIPITOR) 40 MG tablet TAKE 1 TABLET (40 MG TOTAL) BY MOUTH DAILY. NEEDS OFFICE VISIT 90 tablet 1  . carvedilol (COREG) 6.25 MG tablet TAKE 1 TABLET BY MOUTH TWICE A DAY 180 tablet 3  .  cyclobenzaprine (FLEXERIL) 5 MG tablet Take 1 tablet (5 mg total) by mouth at bedtime as needed for muscle spasms.    . diclofenac Sodium (VOLTAREN) 1 % GEL Apply 2-4 g topically 4 (four) times daily. 100 g 5  . glimepiride (AMARYL) 4 MG tablet TAKE 1 TABLET BY MOUTH TWICE A DAY 180 tablet 1  . glucose blood (ONE TOUCH ULTRA TEST) test strip Check sugar up to 6 times a day.  DM2, insulin treated, uncontrolled. 200 each 12  . ibuprofen (ADVIL) 600 MG tablet Take 600 mg by mouth every 8 (eight) hours as needed.    . insulin NPH Human (NOVOLIN N) 100 UNIT/ML injection INJECT 20 UNITS IN THE MORNING AND 20 UNITS IN THE EVENING    . Insulin Pen Needle 32G X 4 MM MISC Use daily with insulin pen. 100 each 3  . Insulin Syringe-Needle U-100 (INSULIN SYRINGE 1CC/31GX5/16") 31G X 5/16" 1 ML MISC Use with NPH insulin twice daily. 100 each 3  . losartan (COZAAR) 100 MG tablet TAKE 1 TABLET BY MOUTH EVERY DAY 90 tablet 2  . metFORMIN (GLUCOPHAGE) 1000 MG tablet TAKE 1 TABLET BY MOUTH TWICE A DAY WITH MEALS 180 tablet 1  . nitroGLYCERIN (NITROSTAT) 0.4 MG SL tablet Place 1 tablet (0.4 mg total) under the tongue every 5 (five) minutes as needed for chest pain. 25 tablet 3  . ondansetron (ZOFRAN) 4 MG tablet Take 4 mg by mouth every 8 (eight) hours as needed.    [name] Kitchen oxyCODONE (OXY IR/ROXICODONE) 5 MG immediate release tablet Take 5 mg by mouth every 6 (six) hours as needed.    . pantoprazole (PROTONIX) 40 MG tablet TAKE 1 TABLET BY MOUTH EVERY DAY 30 tablet 3  . tamsulosin (FLOMAX) 0.4 MG CAPS capsule TAKE 1 CAPSULE BY MOUTH EVERY DAY 90 capsule 2   No facility-administered medications prior to visit.  Cardiac Studies:   Lexiscan myoview stress test 01/07/2018: 1. Lexiscan stress test performed. Exercise capacity was not assessed. Stress symptoms included dyspnea, flushing. Normal blood pressure. The resting electrocardiogram demonstrated normal sinus rhythm, normal resting conduction, no resting arrhythmias and  normal rest repolarization. Stress EKG is non diagnostic for ischemia as it is a pharmacologic stress. 2. The overall quality of the study is excellent. There is no evidence of abnormal lung activity. Stress and rest SPECT images demonstrate homogeneous tracer distribution throughout the myocardium. Gated SPECT imaging reveals normal myocardial thickening and wall motion. The left ventricular ejection fraction was normal (60%).  3. Low risk study.  Echocardiogram 02/01/2018: Left ventricle cavity is normal in size. Normal global wall motion. Indeterminate diastolic filling pattern, indeterminate LAP. Calculated EF 55%. No significant valvular abnormality. Inadequate tricuspid regurgitation jet to estimate pulmonary artery pressure. Normal right atrial pressure.  Coronary angiogram 10/16/2016: 3.0 x 38 mm onyx DES Prox RCA. 06/14/2014: Thrombectomy of the mid RCA & Stenting with 3.0 x 18 mm Xience Alpine DES. Stenting mid LAD 3.0 x 18 mm Xience Alpine DES. Patent Mid LAD 2.75x12 mm Taxus, 2.5x12 Distal RCA stent x 2 from 2004 and 2007. Normal LVEF. Diffuse CAD.   EKG   EKG 07/12/2020: Sinus rhythm at a rate of 63 bpm.  Normal axis.  Poor R wave progression, cannot exclude anterior septal infarct old however likely normal variant.  No evidence of ischemia.  Compared to EKG 05/03/2020, no significant change.  Assessment   Coronary artery disease of native artery of native heart with stable angina pectoris ([place]) - Plan: EKG 12-Lead, isosorbide mononitrate (IMDUR) 30 MG 24 hr tablet  Essential hypertension, benign  There are no discontinued medications.  Meds ordered this encounter  Medications  . isosorbide mononitrate (IMDUR) 30 MG 24 hr tablet    Sig: Take 1 tablet (30 mg total) by mouth daily.    Dispense:  90 tablet    Refill:  3    Recommendations:   Tony Manning  is a 59 y.o. male  with coronary artery disease status post PCI to RCA and LAD, long-standing degenerative disc  disease and involving neck and shoulders, hyperlipidemia, OSA on CPAP, uncontrolled DM, hypertension presents for annual visit and follow-up of coronary disease, hypertension and hyperlipidemia.  Patient presents for an urgent visit with complaints of symptoms suggestive of angina pectoris.  Although his symptoms are atypical, he has a history of chronic occasional anginal episodes which have typically been well controlled in the past with occasional use of sublingual nitroglycerin.  However over the last 3 months patient has had more frequent episodes of angina.  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  However last night he did have an episode of worsening arm pain.  Patient's EKG today reveals no evidence of ischemia, and physical exam is reassuring, especially in view of tenderness along patient's sternum, ribs, and left upper arm.  Patient's diabetes remains uncontrolled, however other cardiovascular risk factors are well controlled.  Blood pressure and lipids are well controlled.    Discussed with patient options for management of angina including antianginal medications, and/or nuclear stress testing or cardiac catheterization.  Patient understands the management options and associated risks and benefits. At this time patient wishes to try medications first to control symptoms.  We will start him on Imdur 30 mg daily.  We will see  him back to reevaluate symptoms, if he continues to have anginal symptoms can consider nuclear stress testing or cardiac catheterization in view of patient's known coronary artery disease.  Educated patient regarding signs and symptoms that would warrant sooner evaluation or evaluation in the emergency department, he verbalized understanding and agreement.    Follow up in 2 weeks, soon if needed or symptoms do not improve.   Patient was seen in collaboration with Dr. Einar Gip.  He also reviewed patient's chart and Dr. Einar Gip is in agreement of the plan.    This was a 30-minute encounter with face-to-face counseling regarding management options and return precautions, medical records review, coordination of care, explanation of complex medical issues, complex medical decision making.    Alethia Berthold, PA-C 07/12/2020, 2:20 PM Office: [phone]

## 2020-07-14 NOTE — Assessment & Plan Note (Addendum)
A1c is slightly better.  See following phone note.  I would not change in medications at this point other than as directed by cardiology.  I think it makes sense to address that first. I would continue glimepiride, insulin, Metformin as listed.

## 2020-07-14 NOTE — Assessment & Plan Note (Signed)
Atypical.  He has had recent cardiology follow-up.  History of sarcoid.  His main concern was about occult cancer and we thought it was reasonable to recheck a plain chest x-ray today.  See notes on imaging.

## 2020-07-17 NOTE — Telephone Encounter (Signed)
Tony Prows, MD  You; Tony Manning, Tony Legacy, PA-C 2 hours ago (5:27 PM)   I am aware of his chest pain and we have advised him to come in and or consider coronary angiogram or a stress test. I will f/u on this.  He is complex and I am happy to do the needful.  Celeste please see if you can see him tomorrow    ===================== App help of all involved.  Will await cards input.

## 2020-07-17 NOTE — Telephone Encounter (Signed)
Pt left v/m that the problem in his chest is no better and cardiology advised pt the problem is not from his heart. Pt wants to know where he can be referred to to find out what is wrong in chest. Pt said it is getting more aggravating.

## 2020-07-17 NOTE — Telephone Encounter (Signed)
I need input from Dr. Einar Gip.  I will send him a separate staff message.  I thank all involved.

## 2020-07-18 NOTE — Telephone Encounter (Signed)
Staff is reaching out to schedule him for urgent visit with me today.

## 2020-07-22 DIAGNOSIS — M25522 Pain in left elbow: Secondary | ICD-10-CM | POA: Diagnosis not present

## 2020-07-22 NOTE — Progress Notes (Signed)
Primary Physician/Referring:  Tonia Ghent, MD  Patient ID: Tony Manning, male    DOB: [birth year], 60 y.o.   MRN: [id number]  Chief Complaint  Patient presents with  . Coronary Artery Disease    pt c/o ongoing chest pain  . Chest Pain  . Follow-up    HPI: Tony Manning  is a 59 y.o. male  with coronary artery disease status post PCI to RCA and LAD, long-standing degenerative disc disease and involving neck and shoulders, hyperlipidemia, OSA on CPAP, uncontrolled DM, hypertension presents for annual visit and follow-up of coronary disease, hypertension and hyperlipidemia.   Patient presents for 2 week follow up of atypical chest pain. At last visit started patient on Imdur and discussed stress testing or cardiac catheterization and patient opted for medical management.  Since last visit patient has continued to have intermittent chest pain which he describes as pressure.  He has not taken sublingual nitroglycerin.  He states that Imdur seems to have reduced the frequency with which he has these episodes and he has only had one episode that he describes as "more intense" with a 5/10 severity.  He has not been monitoring his blood pressure at home.  Of note he has recently seen an orthopedic provider for left arm pain, and reports that they suspect musculoskeletal etiology and have started him on Voltaren gel.    Past Medical History:  Diagnosis Date  . Anginal pain ([place])   . Basal cell carcinoma of face   . Chest pain, unspecified   . Coronary atherosclerosis of native coronary artery    s/p stent x4  . Depression   . Diabetes mellitus without mention of complication   . Diverticulosis   . Dyspnea   . Dysrhythmia   . Erectile dysfunction   . Essential hypertension, benign   . GERD (gastroesophageal reflux disease)   . Heart murmur   . History of kidney stones   . History of nephrolithiasis   . Migraine   . Myocardial infarction ([place])   . Neuromuscular disorder  ([place])   . Osteoarthritis   . Pneumonia   . Postsurgical percutaneous transluminal coronary angioplasty status    s/p stent x4  . Sarcoidosis   . Unspecified sleep apnea    uses C-pap    Past Surgical History:  Procedure Laterality Date  . ANTERIOR CERVICAL DECOMP/DISCECTOMY FUSION N/A 04/07/2017   Procedure: ANTERIOR CERVICAL DECOMPRESSION FUSION, CERVICAL 4-5 WITH INSTRUMENTATION AND ALLOGRAFT;  Surgeon: Phylliss Bob, MD;  Location: [facility];  Service: Orthopedics;  Laterality: N/A;  ANTERIOR CERVICAL DECOMPRESSION FUSION, CERVICAL 4-5 WITH INSTRUMENTATION AND ALLOGRAFT; REQUEST 2.5 HOURS AND FLIP ROOM  . APPENDECTOMY  2005  . CARPAL TUNNEL RELEASE    . CERVICAL FUSION  2009  . CORONARY ANGIOPLASTY WITH STENT PLACEMENT    . CORONARY STENT INTERVENTION N/A 10/16/2016   Procedure: Coronary Stent Intervention;  Surgeon: Adrian Prows, MD;  Location: [facility];  Service: Cardiovascular;  Laterality: N/A;  . CYSTOSCOPY WITH RETROGRADE PYELOGRAM, URETEROSCOPY AND STENT PLACEMENT Right 05/12/2019   Procedure: CYSTOSCOPY WITH RIGHT RETROGRADE PYELOGRAM, URETEROSCOPY HOLMIUM LASER AND STENT PLACEMENT;  Surgeon: Lucas Mallow, MD;  Location: [facility];  Service: Urology;  Laterality: Right;  . ELBOW SURGERY     bilateral  . KNEE SURGERY     Bilateral   . LEFT HEART CATH AND CORONARY ANGIOGRAPHY N/A 10/16/2016   Procedure: Left Heart Cath and Coronary Angiography;  Surgeon: Adrian Prows, MD;  Location: [facility];  Service: Cardiovascular;  Laterality: N/A;  . LEFT HEART CATHETERIZATION WITH CORONARY ANGIOGRAM N/A 06/14/2014   Procedure: LEFT HEART CATHETERIZATION WITH CORONARY ANGIOGRAM;  Surgeon: Laverda Page, MD;  Location: [facility];  Service: Cardiovascular;  Laterality: N/A;  . LUNG BIOPSY     sarcoid  . ROTATOR CUFF REPAIR     left  . SHOULDER ARTHROSCOPY WITH SUBACROMIAL DECOMPRESSION Left 06/16/2018   Procedure: LEFT SHOULDER ARTHROSCOPY WITH ROTATOR CUFF DEBRIDEMENT AND   SUBACROMIAL DECOMPRESSION;  Surgeon: Tania Ade, MD;  Location: [facility];  Service: Orthopedics;  Laterality: Left;   Social History   Tobacco Use  . Smoking status: Never Smoker  . Smokeless tobacco: Former Systems developer    Types: Chew  . Tobacco comment: trying to quit  Substance Use Topics  . Alcohol use: No    Alcohol/week: 0.0 standard drinks   Marital Status: Married   ROS  Review of Systems  Constitutional: Negative for malaise/fatigue and weight gain.  Cardiovascular: Positive for chest pain and dyspnea on exertion (mild). Negative for claudication, leg swelling, near-syncope, orthopnea, palpitations, paroxysmal nocturnal dyspnea and syncope.  Hematologic/Lymphatic: Does not bruise/bleed easily.  Musculoskeletal: Positive for arthritis (right knee) and back pain.       Left upper arm pain   Gastrointestinal: Negative for melena.  Neurological: Negative for dizziness and weakness.   Objective  Blood pressure (!) 148/67, pulse 67, height 5\' 10"  (1.778 m), weight 215 lb (97.5 kg), SpO2 100 %. Body mass index is 30.85 kg/m.  Vitals with BMI 07/23/2020 07/12/2020 07/11/2020  Height 5\' 10"  5\' 10"  5\' 10"   Weight 215 lbs 215 lbs 214 lbs  BMI 30.85 38.10 17.51  Systolic 025 852 778  Diastolic 67 61 78  Pulse 67 66 60    Physical Exam Vitals reviewed.  Constitutional:      General: He is not in acute distress.    Appearance: He is well-developed.  HENT:     Head: Normocephalic and atraumatic.  Neck:     Thyroid: No thyromegaly.     Vascular: No JVD.  Cardiovascular:     Rate and Rhythm: Normal rate and regular rhythm.     Pulses: Intact distal pulses.     Heart sounds: Normal heart sounds, S1 normal and S2 normal. No murmur heard.  No gallop.   Pulmonary:     Effort: Pulmonary effort is normal. No respiratory distress.     Breath sounds: Normal breath sounds. No wheezing, rhonchi or rales.  Abdominal:     General: Bowel sounds are normal.     Palpations: Abdomen is soft.   Musculoskeletal:        General: Tenderness (Along sternum, ribs, and left arm) present. Normal range of motion.     Right lower leg: No edema.     Left lower leg: No edema.  Neurological:     Mental Status: He is alert.    Laboratory examination:   CMP Latest Ref Rng & Units 03/05/2020 05/29/2019 05/18/2019  Glucose 70 - 99 mg/dL 80 88 203(H)  BUN 6 - 23 mg/dL 18 20 29(H)  Creatinine 0.40 - 1.50 mg/dL 1.43 1.43 1.66(H)  Sodium 135 - 145 mEq/L 141 141 140  Potassium 3.5 - 5.1 mEq/L 4.4 4.7 4.7  Chloride 96 - 112 mEq/L 107 106 108  CO2 19 - 32 mEq/L 27 28 26   Calcium 8.4 - 10.5 mg/dL 9.8 10.1 9.6  Total Protein 6.0 - 8.3 g/dL 7.5 - -  Total Bilirubin 0.2 - 1.2 mg/dL 0.5 - -  Alkaline Phos 39 - 117 U/L 67 - -  AST 0 - 37 U/L 20 - -  ALT 0 - 53 U/L 21 - -   CBC Latest Ref Rng & Units 03/05/2020 05/29/2019 05/18/2019  WBC 4.0 - 10.5 K/uL 8.1 8.5 8.4  Hemoglobin 13.0 - 17.0 g/dL 13.6 13.7 12.8(L)  Hematocrit 39 - 52 % 39.1 40.8 38.3(L)  Platelets 150 - 400 K/uL 209.0 236.0 286.0   Lipid Panel Recent Labs    03/05/20 1446  CHOL 120  TRIG 89.0  LDLCALC 59  VLDL 17.8  HDL 43.00  CHOLHDL 3    HEMOGLOBIN A1C Lab Results  Component Value Date   HGBA1C 7.6 (A) 07/11/2020   MPG 180.03 05/11/2019   TSH No results for input(s): TSH in the last 8760 hours.   Outpatient Medications Prior to Visit  Medication Sig Dispense Refill  . aspirin 81 MG chewable tablet Chew 81 mg by mouth at bedtime.    [name] Kitchen atorvastatin (LIPITOR) 40 MG tablet TAKE 1 TABLET (40 MG TOTAL) BY MOUTH DAILY. NEEDS OFFICE VISIT 90 tablet 1  . carvedilol (COREG) 6.25 MG tablet TAKE 1 TABLET BY MOUTH TWICE A DAY 180 tablet 3  . cyclobenzaprine (FLEXERIL) 5 MG tablet Take 1 tablet (5 mg total) by mouth at bedtime as needed for muscle spasms.    . diclofenac Sodium (VOLTAREN) 1 % GEL Apply 2-4 g topically 4 (four) times daily. 100 g 5  . glimepiride (AMARYL) 4 MG tablet TAKE 1 TABLET BY MOUTH TWICE A DAY 180 tablet 1   . glucose blood (ONE TOUCH ULTRA TEST) test strip Check sugar up to 6 times a day.  DM2, insulin treated, uncontrolled. 200 each 12  . ibuprofen (ADVIL) 600 MG tablet Take 600 mg by mouth every 8 (eight) hours as needed.    . insulin NPH Human (NOVOLIN N) 100 UNIT/ML injection INJECT 20 UNITS IN THE MORNING AND 20 UNITS IN THE EVENING    . Insulin Pen Needle 32G X 4 MM MISC Use daily with insulin pen. 100 each 3  . Insulin Syringe-Needle U-100 (INSULIN SYRINGE 1CC/31GX5/16") 31G X 5/16" 1 ML MISC Use with NPH insulin twice daily. 100 each 3  . isosorbide mononitrate (IMDUR) 30 MG 24 hr tablet Take 1 tablet (30 mg total) by mouth daily. 90 tablet 3  . losartan (COZAAR) 100 MG tablet TAKE 1 TABLET BY MOUTH EVERY DAY 90 tablet 2  . metFORMIN (GLUCOPHAGE) 1000 MG tablet TAKE 1 TABLET BY MOUTH TWICE A DAY WITH MEALS 180 tablet 1  . nitroGLYCERIN (NITROSTAT) 0.4 MG SL tablet Place 1 tablet (0.4 mg total) under the tongue every 5 (five) minutes as needed for chest pain. 25 tablet 3  . ondansetron (ZOFRAN) 4 MG tablet Take 4 mg by mouth every 8 (eight) hours as needed.    [name] Kitchen oxyCODONE (OXY IR/ROXICODONE) 5 MG immediate release tablet Take 5 mg by mouth every 6 (six) hours as needed.    . pantoprazole (PROTONIX) 40 MG tablet TAKE 1 TABLET BY MOUTH EVERY DAY 30 tablet 3  . tamsulosin (FLOMAX) 0.4 MG CAPS capsule TAKE 1 CAPSULE BY MOUTH EVERY DAY 90 capsule 2  . amitriptyline (ELAVIL) 10 MG tablet Take 1 tablet (10 mg total) by mouth at bedtime. (Patient not taking: Reported on 07/23/2020) 30 tablet 1   No facility-administered medications prior to visit.   Cardiac Studies:   Lexiscan myoview stress test 01/07/2018:  1. Lexiscan stress test performed. Exercise capacity was not assessed. Stress symptoms included dyspnea, flushing. Normal blood pressure. The resting electrocardiogram demonstrated normal sinus rhythm, normal resting conduction, no resting arrhythmias and normal rest repolarization. Stress EKG  is non diagnostic for ischemia as it is a pharmacologic stress. 2. The overall quality of the study is excellent. There is no evidence of abnormal lung activity. Stress and rest SPECT images demonstrate homogeneous tracer distribution throughout the myocardium. Gated SPECT imaging reveals normal myocardial thickening and wall motion. The left ventricular ejection fraction was normal (60%).  3. Low risk study.  Echocardiogram 02/01/2018: Left ventricle cavity is normal in size. Normal global wall motion. Indeterminate diastolic filling pattern, indeterminate LAP. Calculated EF 55%. No significant valvular abnormality. Inadequate tricuspid regurgitation jet to estimate pulmonary artery pressure. Normal right atrial pressure.  Coronary angiogram 10/16/2016: 3.0 x 38 mm onyx DES Prox RCA. 06/14/2014: Thrombectomy of the mid RCA & Stenting with 3.0 x 18 mm Xience Alpine DES. Stenting mid LAD 3.0 x 18 mm Xience Alpine DES. Patent Mid LAD 2.75x12 mm Taxus, 2.5x12 Distal RCA stent x 2 from 2004 and 2007. Normal LVEF. Diffuse CAD.   EKG   EKG 07/23/2020: Sinus rhythm at a rate of 64 bpm, left atrial enlargement.  Normal axis.  No evidence of ischemia.  Compared to EKG 07/12/2020, no poor R wave progression noted.  Assessment   Coronary artery disease of native artery of native heart with stable angina pectoris ([place]) - Plan: EKG 12-Lead, amLODipine (NORVASC) 5 MG tablet, PCV MYOCARDIAL PERFUSION WITH LEXISCAN  Essential hypertension, benign - Plan: amLODipine (NORVASC) 5 MG tablet  Uncontrolled diabetes with stage 3 chronic kidney disease GFR 30-59 (HCC)  There are no discontinued medications.  Meds ordered this encounter  Medications  . amLODipine (NORVASC) 5 MG tablet    Sig: Take 1 tablet (5 mg total) by mouth daily.    Dispense:  30 tablet    Refill:  3    Recommendations:   Tony Manning  is a 59 y.o. male  with coronary artery disease status post PCI to RCA and LAD, long-standing  degenerative disc disease and involving neck and shoulders, hyperlipidemia, OSA on CPAP, uncontrolled DM, hypertension presents for annual visit and follow-up of coronary disease, hypertension and hyperlipidemia.  Patient presents for 2-week follow-up of anginal symptoms.  He states Imdur has mildly improved severity as well as frequency of symptoms however he continues to have relatively frequent episodes of anginal pain. Will continue Imdur.  He has not taken nitroglycerin.  At last office visit patient had opted for medical management, however now recommend further ischemic evaluation.  Discussed with patient regarding benefits of nuclear stress testing versus cardiac catheterization.  After discussion of risks and benefits patient prefers to do nuclear stress testing first with the understanding that he may need subsequent cardiac catheterization.  We will schedule patient for nuclear stress test with Lexiscan as he cannot walk on a treadmill due to back pain and bilateral lower extremity neuropathy.  Patient's blood pressure elevated in the office today, and was noted to be high at previous 2 visits.  Patient does not monitor his blood pressure at home.  We will start him on amlodipine 5 mg daily for improved blood pressure control as well as antianginal therapy.  Patient will obtain blood pressure cuff and keep a written log.  Our office will contact him in 2 weeks for blood pressure readings.  Counseled patient regarding adverse effects of amlodipine.  Also discussed signs and symptoms that would warrant sooner evaluation.  In regard to arm pain, suspect musculoskeletal etiology.  Will defer management to primary care and orthopedics.  Follow-up in approximately 6 weeks for hypertension and angina following nuclear stress test.     Alethia Berthold, PA-C 07/23/2020, 9:52 AM Office: [phone]

## 2020-07-23 ENCOUNTER — Ambulatory Visit: Payer: Medicare HMO | Admitting: Student

## 2020-07-23 ENCOUNTER — Other Ambulatory Visit: Payer: Self-pay

## 2020-07-23 ENCOUNTER — Encounter: Payer: Self-pay | Admitting: Student

## 2020-07-23 VITALS — BP 148/67 | HR 67 | Ht 70.0 in | Wt 215.0 lb

## 2020-07-23 DIAGNOSIS — N183 Chronic kidney disease, stage 3 unspecified: Secondary | ICD-10-CM | POA: Diagnosis not present

## 2020-07-23 DIAGNOSIS — I10 Essential (primary) hypertension: Secondary | ICD-10-CM

## 2020-07-23 DIAGNOSIS — IMO0002 Reserved for concepts with insufficient information to code with codable children: Secondary | ICD-10-CM

## 2020-07-23 DIAGNOSIS — E1165 Type 2 diabetes mellitus with hyperglycemia: Secondary | ICD-10-CM | POA: Diagnosis not present

## 2020-07-23 DIAGNOSIS — E1122 Type 2 diabetes mellitus with diabetic chronic kidney disease: Secondary | ICD-10-CM

## 2020-07-23 DIAGNOSIS — I25118 Atherosclerotic heart disease of native coronary artery with other forms of angina pectoris: Secondary | ICD-10-CM | POA: Diagnosis not present

## 2020-07-23 MED ORDER — AMLODIPINE BESYLATE 5 MG PO TABS: 5.0000 mg | ORAL_TABLET | Freq: Every day | ORAL | 3 refills | Status: DC

## 2020-07-23 NOTE — Patient Instructions (Signed)
Monitor blood pressure and keep a written log.

## 2020-07-25 ENCOUNTER — Other Ambulatory Visit: Payer: Self-pay | Admitting: Cardiology

## 2020-07-27 ENCOUNTER — Other Ambulatory Visit: Payer: Self-pay | Admitting: Family Medicine

## 2020-07-29 ENCOUNTER — Telehealth: Payer: Self-pay | Admitting: Student

## 2020-07-29 ENCOUNTER — Ambulatory Visit
Admission: RE | Admit: 2020-07-29 | Discharge: 2020-07-29 | Disposition: A | Discharge status: Home / Self Care | Payer: Medicare HMO | Source: Ambulatory Visit | Attending: Neurology | Admitting: Neurology

## 2020-07-29 ENCOUNTER — Telehealth: Payer: Self-pay

## 2020-07-29 DIAGNOSIS — R29898 Other symptoms and signs involving the musculoskeletal system: Secondary | ICD-10-CM

## 2020-07-29 DIAGNOSIS — R200 Anesthesia of skin: Secondary | ICD-10-CM

## 2020-07-29 DIAGNOSIS — R29810 Facial weakness: Secondary | ICD-10-CM | POA: Diagnosis not present

## 2020-07-29 MED ORDER — GADOBENATE DIMEGLUMINE 529 MG/ML IV SOLN
20.0000 mL | Freq: Once | INTRAVENOUS | Status: AC | PRN
  Administered 2020-07-29: 20 mL via INTRAVENOUS

## 2020-07-29 MED ORDER — AMLODIPINE BESYLATE 10 MG PO TABS: 10.0000 mg | ORAL_TABLET | Freq: Every day | ORAL | 3 refills | Status: DC

## 2020-07-29 NOTE — Telephone Encounter (Signed)
Spoke with patient to review her written home blood pressure log he had dropped off to the office.  Blood pressure remains elevated above goal.  Will increase amlodipine from 5 to 10 mg daily.  Patient will continue to monitor blood pressure and report if it remains elevated above goal to our office.  We will keep follow-up appointment in January otherwise.

## 2020-07-29 NOTE — Telephone Encounter (Signed)
Pt advised of his MRI results.  Pt wanted to know what the steps would be?

## 2020-07-29 NOTE — Telephone Encounter (Signed)
Sig updated.  rx sent.  Thanks.

## 2020-07-29 NOTE — Telephone Encounter (Signed)
-----   Message from Pieter Partridge, DO sent at 07/29/2020 11:47 AM EST ----- MRI of brain is unremarkable.  No concerning findings to explain the right sided weakness.

## 2020-07-29 NOTE — Telephone Encounter (Signed)
Pt advised.    Front desk please call pt. He is ok with schedule after the first of the year.

## 2020-07-29 NOTE — Telephone Encounter (Signed)
Pharmacy requests refill on: Novolin N 100 unit/mL  LAST REFILL: 07/11/2020 LAST OV: 07/11/2020 NEXT OV: Not Scheduled  PHARMACY: [id number] [place], [place]   Prescription refill order is different from order in chart. Please advise.

## 2020-07-29 NOTE — Telephone Encounter (Signed)
Called patient to sch appt lmom for him to call back

## 2020-07-29 NOTE — Telephone Encounter (Signed)
I would order NCV-EMG of right arm and leg, in which we apply electrodes to his skin and give him little shocks to test the nerves, as well as very small/thin needle into the muscles to assess the nerves to those muscles.

## 2020-07-30 NOTE — Telephone Encounter (Signed)
Patient is sch for RLE & RUE EMG on 09/11/20. Please add order. Thanks!

## 2020-08-07 ENCOUNTER — Other Ambulatory Visit: Payer: Self-pay

## 2020-08-07 MED ORDER — AMLODIPINE BESYLATE 10 MG PO TABS: 10.0000 mg | ORAL_TABLET | Freq: Every day | ORAL | 3 refills | Status: DC

## 2020-08-12 ENCOUNTER — Telehealth: Payer: Self-pay | Admitting: *Deleted

## 2020-08-12 MED ORDER — TIZANIDINE HCL 4 MG PO TABS
2.0000 mg | ORAL_TABLET | Freq: Three times a day (TID) | ORAL | 0 refills | Status: DC | PRN
Start: 2020-08-12 — End: 2021-05-05

## 2020-08-12 NOTE — Telephone Encounter (Signed)
Patient left a voicemail stating that he is having shoulder blade spasms again. Patient stated that he has taken methocarbamol which has not helped. Patient wants to know if Dr. [name] will call something in for him or does he need to come in to be seen?  Ph[facility]acy [facility]

## 2020-08-12 NOTE — Telephone Encounter (Signed)
If he is having pain that is restricted to the muscle in/around his shoulder, he could try tizanidine to see if that helps.  Sedation caution, since it is a muscle relaxer.  If that is not helping then I think he needs to get checked in clinic.  I sent the medication.  I would not take it with Flexeril/cyclobenzaprine or methocarbamol.  Thanks.

## 2020-08-13 NOTE — Telephone Encounter (Signed)
Message sent thru MyChart 

## 2020-08-20 DIAGNOSIS — M545 Low back pain, unspecified: Secondary | ICD-10-CM | POA: Diagnosis not present

## 2020-08-20 DIAGNOSIS — M542 Cervicalgia: Secondary | ICD-10-CM | POA: Diagnosis not present

## 2020-08-27 DIAGNOSIS — M545 Low back pain, unspecified: Secondary | ICD-10-CM | POA: Diagnosis not present

## 2020-08-28 DIAGNOSIS — S46112D Strain of muscle, fascia and tendon of long head of biceps, left arm, subsequent encounter: Secondary | ICD-10-CM | POA: Diagnosis not present

## 2020-08-28 DIAGNOSIS — M4003 Postural kyphosis, cervicothoracic region: Secondary | ICD-10-CM | POA: Diagnosis not present

## 2020-08-28 DIAGNOSIS — S29012D Strain of muscle and tendon of back wall of thorax, subsequent encounter: Secondary | ICD-10-CM | POA: Diagnosis not present

## 2020-08-29 ENCOUNTER — Other Ambulatory Visit: Payer: Self-pay | Admitting: Family Medicine

## 2020-09-02 ENCOUNTER — Telehealth: Payer: Self-pay | Admitting: Family Medicine

## 2020-09-02 NOTE — Chronic Care Management (AMB) (Signed)
  Chronic Care Management   Note  09/02/2020 Name: Tony Manning MRN: [id number] DOB: 22-Dec-1960  Tony Manning is a 59 y.o. year old male who is a primary care patient of Joaquim Nam, MD. I reached out to Agustina Caroli by phone today in response to a referral sent by [name]'s PCP, Joaquim Nam, MD.   [name] was given information about Chronic Care Management services today including:  1. CCM service includes personalized support from designated clinical staff supervised by his physician, including individualized plan of care and coordination with other care providers 2. 24/7 contact phone numbers for assistance for urgent and routine care needs. 3. Service will only be billed when office clinical staff spend 20 minutes or more in a month to coordinate care. 4. Only one practitioner may furnish and bill the service in a calendar month. 5. The patient may stop CCM services at any time (effective at the end of the month) by phone call to the office staff.   Patient agreed to services and verbal consent obtained.   Follow up plan:   Aggie Hacker  [employer] Scheduler

## 2020-09-09 ENCOUNTER — Other Ambulatory Visit: Payer: Medicare HMO

## 2020-09-11 ENCOUNTER — Encounter: Payer: Medicare HMO | Admitting: Neurology

## 2020-09-17 ENCOUNTER — Ambulatory Visit: Payer: Medicare HMO | Admitting: Student

## 2020-09-18 DIAGNOSIS — N202 Calculus of kidney with calculus of ureter: Secondary | ICD-10-CM | POA: Diagnosis not present

## 2020-09-20 ENCOUNTER — Other Ambulatory Visit: Payer: Self-pay

## 2020-09-20 ENCOUNTER — Ambulatory Visit (INDEPENDENT_AMBULATORY_CARE_PROVIDER_SITE_OTHER): Payer: Medicare HMO

## 2020-09-20 DIAGNOSIS — Z0000 Encounter for general adult medical examination without abnormal findings: Secondary | ICD-10-CM | POA: Diagnosis not present

## 2020-09-20 NOTE — Progress Notes (Signed)
PCP notes:  Health Maintenance: Tdap- insurance Colonoscopy- declined Foot exam- due   Abnormal Screenings: none   Patient concerns: none   Nurse concerns: none   Next PCP appt.: none

## 2020-09-20 NOTE — Patient Instructions (Signed)
[name] , Thank you for taking time to come for your Medicare Wellness Visit. I appreciate your ongoing commitment to your health goals. Please review the following plan we discussed and let me know if I can assist you in the future.   Screening recommendations/referrals: Colonoscopy: declined Recommended yearly ophthalmology/optometry visit for glaucoma screening and checkup Recommended yearly dental visit for hygiene and checkup  Vaccinations: Influenza vaccine: Up to date, completed 05/27/2020, due 04/2021 Pneumococcal vaccine: Up to date, due at age 60, completed 02/16/2013, due at age 62  Tdap vaccine: decline-insurance Shingles vaccine: due, check with your insurance regarding coverage if interested    Covid-19: Completed series  Advanced directives: Advance directive discussed with you today. Even though you declined this today please call our office should you change your mind and we can give you the proper paperwork for you to fill out.  Conditions/risks identified: Diabetes  Next appointment: Follow up in one year for your annual wellness visit.   Preventive Care 61-46 Years Old, Male Preventive care refers to lifestyle choices and visits with your health care provider that can promote health and wellness. What does preventive care include?  A yearly physical exam. This is also called an annual well check.  Dental exams once or twice a year.  Routine eye exams. Ask your health care provider how often you should have your eyes checked.  Personal lifestyle choices, including:  Daily care of your teeth and gums.  Regular physical activity.  Eating a healthy diet.  Avoiding tobacco and drug use.  Limiting alcohol use.  Practicing safe sex.  Taking low doses of aspirin every day.  Taking vitamin and mineral supplements as recommended by your health care provider. What happens during an annual well check? The services and screenings done by your health care provider during your  annual well check will depend on your age, overall health, lifestyle risk factors, and family history of disease. Counseling  Your health care provider may ask you questions about your:  Alcohol use.  Tobacco use.  Drug use.  Emotional well-being.  Home and relationship well-being.  Sexual activity.  Eating habits.  History of falls.  Memory and ability to understand (cognition).  Work and work Statistician. Screening  You may have the following tests or measurements:  Height, weight, and BMI.  Blood pressure.  Lipid and cholesterol levels. These may be checked every 5 years, or more frequently if you are over 55 years old.  Skin check.  Lung cancer screening. You may have this screening every year starting at age 28 if you have a 30-pack-year history of smoking and currently smoke or have quit within the past 15 years.  Fecal occult blood test (FOBT) of the stool. You may have this test every year starting at age 36.  Flexible sigmoidoscopy or colonoscopy. You may have a sigmoidoscopy every 5 years or a colonoscopy every 10 years starting at age 82.  Prostate cancer screening. Recommendations will vary depending on your family history and other risks.  Hepatitis C blood test.  Hepatitis B blood test.  Sexually transmitted disease (STD) testing.  Diabetes screening. This is done by checking your blood sugar (glucose) after you have not eaten for a while (fasting). You may have this done every 1-3 years.  Abdominal aortic aneurysm (AAA) screening. You may need this if you are a current or former smoker.  Osteoporosis. You may be screened starting at age 71 if you are at high risk. Talk with your health care provider about  your test results, treatment options, and if necessary, the need for more tests. Vaccines  Your health care provider may recommend certain vaccines, such as:  Influenza vaccine. This is recommended every year.  Tetanus, diphtheria, and  acellular pertussis (Tdap, Td) vaccine. You may need a Td booster every 10 years.  Zoster vaccine. You may need this after age 40.  Pneumococcal 13-valent conjugate (PCV13) vaccine. One dose is recommended after age 27.  Pneumococcal polysaccharide (PPSV23) vaccine. One dose is recommended after age 74. Talk to your health care provider about which screenings and vaccines you need and how often you need them. This information is not intended to replace advice given to you by your health care provider. Make sure you discuss any questions you have with your health care provider. Document Released: 09/20/2015 Document Revised: 05/13/2016 Document Reviewed: 06/25/2015 Elsevier Interactive Patient Education  2017 [place] Prevention in the Home Falls can cause injuries. They can happen to people of all ages. There are many things you can do to make your home safe and to help prevent falls. What can I do on the outside of my home?  Regularly fix the edges of walkways and driveways and fix any cracks.  Remove anything that might make you trip as you walk through a door, such as a raised step or threshold.  Trim any bushes or trees on the path to your home.  Use bright outdoor lighting.  Clear any walking paths of anything that might make someone trip, such as rocks or tools.  Regularly check to see if handrails are loose or broken. Make sure that both sides of any steps have handrails.  Any raised decks and porches should have guardrails on the edges.  Have any leaves, snow, or ice cleared regularly.  Use sand or salt on walking paths during winter.  Clean up any spills in your garage right away. This includes oil or grease spills. What can I do in the bathroom?  Use night lights.  Install grab bars by the toilet and in the tub and shower. Do not use towel bars as grab bars.  Use non-skid mats or decals in the tub or shower.  If you need to sit down in the shower, use  a plastic, non-slip stool.  Keep the floor dry. Clean up any water that spills on the floor as soon as it happens.  Remove soap buildup in the tub or shower regularly.  Attach bath mats securely with double-sided non-slip rug tape.  Do not have throw rugs and other things on the floor that can make you trip. What can I do in the bedroom?  Use night lights.  Make sure that you have a light by your bed that is easy to reach.  Do not use any sheets or blankets that are too big for your bed. They should not hang down onto the floor.  Have a firm chair that has side arms. You can use this for support while you get dressed.  Do not have throw rugs and other things on the floor that can make you trip. What can I do in the kitchen?  Clean up any spills right away.  Avoid walking on wet floors.  Keep items that you use a lot in easy-to-reach places.  If you need to reach something above you, use a strong step stool that has a grab bar.  Keep electrical cords out of the way.  Do not use floor polish or wax  that makes floors slippery. If you must use wax, use non-skid floor wax.  Do not have throw rugs and other things on the floor that can make you trip. What can I do with my stairs?  Do not leave any items on the stairs.  Make sure that there are handrails on both sides of the stairs and use them. Fix handrails that are broken or loose. Make sure that handrails are as long as the stairways.  Check any carpeting to make sure that it is firmly attached to the stairs. Fix any carpet that is loose or worn.  Avoid having throw rugs at the top or bottom of the stairs. If you do have throw rugs, attach them to the floor with carpet tape.  Make sure that you have a light switch at the top of the stairs and the bottom of the stairs. If you do not have them, ask someone to add them for you. What else can I do to help prevent falls?  Wear shoes that:  Do not have high heels.  Have  rubber bottoms.  Are comfortable and fit you well.  Are closed at the toe. Do not wear sandals.  If you use a stepladder:  Make sure that it is fully opened. Do not climb a closed stepladder.  Make sure that both sides of the stepladder are locked into place.  Ask someone to hold it for you, if possible.  Clearly mark and make sure that you can see:  Any grab bars or handrails.  First and last steps.  Where the edge of each step is.  Use tools that help you move around (mobility aids) if they are needed. These include:  Canes.  Walkers.  Scooters.  Crutches.  Turn on the lights when you go into a dark area. Replace any light bulbs as soon as they burn out.  Set up your furniture so you have a clear path. Avoid moving your furniture around.  If any of your floors are uneven, fix them.  If there are any pets around you, be aware of where they are.  Review your medicines with your doctor. Some medicines can make you feel dizzy. This can increase your chance of falling. Ask your doctor what other things that you can do to help prevent falls. This information is not intended to replace advice given to you by your health care provider. Make sure you discuss any questions you have with your health care provider. Document Released: 06/20/2009 Document Revised: 01/30/2016 Document Reviewed: 09/28/2014 Elsevier Interactive Patient Education  2017 [employer].

## 2020-09-20 NOTE — Progress Notes (Signed)
Subjective:   Tony Manning is a 60 y.o. male who presents for Medicare Annual/Subsequent preventive examination.  Review of Systems: N/A      I connected with the patient today by telephone and verified that I am speaking with the correct person using two identifiers. Location patient: home Location nurse: work Persons participating in the telephone visit: patient, nurse.   I discussed the limitations, risks, security and privacy concerns of performing an evaluation and management service by telephone and the availability of in person appointments. I also discussed with the patient that there may be a patient responsible charge related to this service. The patient expressed understanding and verbally consented to this telephonic visit.        Cardiac Risk Factors include: advanced age (>54men, >52 women);male gender;diabetes mellitus     Objective:    Today's Vitals   09/20/20 1357  PainSc: 10-Worst pain ever   There is no height or weight on file to calculate BMI.  Advanced Directives 09/20/2020 01/11/2020 05/11/2019 05/05/2019 06/16/2018 06/07/2018 09/21/2017  Does Patient Have a Medical Advance Directive? No Yes Yes Yes No No Yes  Type of Advance Directive - Healthcare Power of [place];Living will [place];Living will - - [place];Living will  Copy of [place] in Chart? - - - - - - No - copy requested  Would patient like information on creating a medical advance directive? No - Patient declined - - - No - Patient declined No - Patient declined -    Current Medications (verified) Outpatient Encounter Medications as of 09/20/2020  Medication Sig  . amLODipine (NORVASC) 10 MG tablet Take 1 tablet (10 mg total) by mouth daily.  [name] Kitchen aspirin 81 MG chewable tablet Chew 81 mg by mouth at bedtime.  [name] Kitchen atorvastatin (LIPITOR) 40 MG tablet TAKE 1 TABLET (40 MG TOTAL) BY MOUTH DAILY. NEEDS OFFICE  VISIT  . carvedilol (COREG) 6.25 MG tablet TAKE 1 TABLET BY MOUTH TWICE A DAY  . diclofenac Sodium (VOLTAREN) 1 % GEL Apply 2-4 g topically 4 (four) times daily.  [name] Kitchen glimepiride (AMARYL) 4 MG tablet TAKE 1 TABLET BY MOUTH TWICE A DAY  . glucose blood (ONE TOUCH ULTRA TEST) test strip Check sugar up to 6 times a day.  DM2, insulin treated, uncontrolled.  [name] Kitchen ibuprofen (ADVIL) 600 MG tablet Take 600 mg by mouth every 8 (eight) hours as needed.  . insulin NPH Human (NOVOLIN N) 100 UNIT/ML injection Inject 0.2 mLs (20 Units total) into the skin 2 (two) times daily before a meal.  . Insulin Pen Needle 32G X 4 MM MISC Use daily with insulin pen.  . Insulin Syringe-Needle U-100 (INSULIN SYRINGE 1CC/31GX5/16") 31G X 5/16" 1 ML MISC Use with NPH insulin twice daily.  . isosorbide mononitrate (IMDUR) 30 MG 24 hr tablet Take 1 tablet (30 mg total) by mouth daily.  [name] Kitchen losartan (COZAAR) 100 MG tablet TAKE 1 TABLET BY MOUTH EVERY DAY  . metFORMIN (GLUCOPHAGE) 1000 MG tablet TAKE 1 TABLET BY MOUTH TWICE A DAY WITH MEALS  . nitroGLYCERIN (NITROSTAT) 0.4 MG SL tablet Place 1 tablet (0.4 mg total) under the tongue every 5 (five) minutes as needed for chest pain.  [name] Kitchen ondansetron (ZOFRAN) 4 MG tablet Take 4 mg by mouth every 8 (eight) hours as needed.  [name] Kitchen oxyCODONE (OXY IR/ROXICODONE) 5 MG immediate release tablet Take 5 mg by mouth every 6 (six) hours as needed.  . pantoprazole (PROTONIX)  40 MG tablet TAKE 1 TABLET BY MOUTH EVERY DAY  . tamsulosin (FLOMAX) 0.4 MG CAPS capsule TAKE 1 CAPSULE BY MOUTH EVERY DAY  . tiZANidine (ZANAFLEX) 4 MG tablet Take 0.5-1 tablets (2-4 mg total) by mouth every 8 (eight) hours as needed for muscle spasms (Sedation caution).  [name] Kitchen amitriptyline (ELAVIL) 10 MG tablet Take 1 tablet (10 mg total) by mouth at bedtime. (Patient not taking: No sig reported)   No facility-administered encounter medications on file as of 09/20/2020.    Allergies (verified) Ambien [zolpidem], Lisinopril, and  Tramadol   History: Past Medical History:  Diagnosis Date  . Anginal pain ([place])   . Basal cell carcinoma of face   . Chest pain, unspecified   . Coronary atherosclerosis of native coronary artery    s/p stent x4  . Depression   . Diabetes mellitus without mention of complication   . Diverticulosis   . Dyspnea   . Dysrhythmia   . Erectile dysfunction   . Essential hypertension, benign   . GERD (gastroesophageal reflux disease)   . Heart murmur   . History of kidney stones   . History of nephrolithiasis   . Migraine   . Myocardial infarction ([place])   . Neuromuscular disorder ([place])   . Osteoarthritis   . Pneumonia   . Postsurgical percutaneous transluminal coronary angioplasty status    s/p stent x4  . Sarcoidosis   . Unspecified sleep apnea    uses C-pap   Past Surgical History:  Procedure Laterality Date  . ANTERIOR CERVICAL DECOMP/DISCECTOMY FUSION N/A 04/07/2017   Procedure: ANTERIOR CERVICAL DECOMPRESSION FUSION, CERVICAL 4-5 WITH INSTRUMENTATION AND ALLOGRAFT;  Surgeon: Phylliss Bob, MD;  Location: [place];  Service: Orthopedics;  Laterality: N/A;  ANTERIOR CERVICAL DECOMPRESSION FUSION, CERVICAL 4-5 WITH INSTRUMENTATION AND ALLOGRAFT; REQUEST 2.5 HOURS AND FLIP ROOM  . APPENDECTOMY  2005  . CARPAL TUNNEL RELEASE    . CERVICAL FUSION  2009  . CORONARY ANGIOPLASTY WITH STENT PLACEMENT    . CORONARY STENT INTERVENTION N/A 10/16/2016   Procedure: Coronary Stent Intervention;  Surgeon: Adrian Prows, MD;  Location: [place] [facility];  Service: Cardiovascular;  Laterality: N/A;  . CYSTOSCOPY WITH RETROGRADE PYELOGRAM, URETEROSCOPY AND STENT PLACEMENT Right 05/12/2019   Procedure: CYSTOSCOPY WITH RIGHT RETROGRADE PYELOGRAM, URETEROSCOPY HOLMIUM LASER AND STENT PLACEMENT;  Surgeon: Lucas Mallow, MD;  Location: [facility];  Service: Urology;  Laterality: Right;  . ELBOW SURGERY     bilateral  . KNEE SURGERY     Bilateral   . LEFT HEART CATH AND CORONARY ANGIOGRAPHY N/A 10/16/2016    Procedure: Left Heart Cath and Coronary Angiography;  Surgeon: Adrian Prows, MD;  Location: [place] [facility];  Service: Cardiovascular;  Laterality: N/A;  . LEFT HEART CATHETERIZATION WITH CORONARY ANGIOGRAM N/A 06/14/2014   Procedure: LEFT HEART CATHETERIZATION WITH CORONARY ANGIOGRAM;  Surgeon: Laverda Page, MD;  Location: [facility];  Service: Cardiovascular;  Laterality: N/A;  . LUNG BIOPSY     sarcoid  . ROTATOR CUFF REPAIR     left  . SHOULDER ARTHROSCOPY WITH SUBACROMIAL DECOMPRESSION Left 06/16/2018   Procedure: LEFT SHOULDER ARTHROSCOPY WITH ROTATOR CUFF DEBRIDEMENT AND  SUBACROMIAL DECOMPRESSION;  Surgeon: Tania Ade, MD;  Location: [facility];  Service: Orthopedics;  Laterality: Left;   Family History  Problem Relation Age of Onset  . Arthritis Mother   . Depression Mother   . Stroke Mother   . Arthritis Father   . Diabetes Father   .  Coronary artery disease Father   . Colon cancer Neg Hx   . Prostate cancer Neg Hx   . Esophageal cancer Neg Hx   . Rectal cancer Neg Hx   . Stomach cancer Neg Hx    Social History   Socioeconomic History  . Marital status: Married    Spouse name: Not on file  . Number of children: 2  . Years of education: Not on file  . Highest education level: Not on file  Occupational History  . Occupation: disabled    Associate Professormployer: unemployed  Tobacco Use  . Smoking status: Never Smoker  . Smokeless tobacco: Former NeurosurgeonUser    Types: Chew  . Tobacco comment: trying to quit  Vaping Use  . Vaping Use: Never used  Substance and Sexual Activity  . Alcohol use: No    Alcohol/week: 0.0 standard drinks  . Drug use: No  . Sexual activity: Yes    Birth control/protection: Condom  Other Topics Concern  . Not on file  Social History Narrative   Married, 1987   2 kids   On disability   Social Determinants of Health   Financial Resource Strain: Low Risk   . Difficulty of Paying Living Expenses: Not hard at all  Food Insecurity: No Food Insecurity   . Worried About Programme researcher, broadcasting/film/videounning Out of Food in the Last Year: Never true  . Ran Out of Food in the Last Year: Never true  Transportation Needs: No Transportation Needs  . Lack of Transportation (Medical): No  . Lack of Transportation (Non-Medical): No  Physical Activity: Inactive  . Days of Exercise per Week: 0 days  . Minutes of Exercise per Session: 0 min  Stress: No Stress Concern Present  . Feeling of Stress : Not at all  Social Connections: Not on file    Tobacco Counseling Counseling given: Not Answered Comment: trying to quit   Clinical Intake:  Pre-visit preparation completed: Yes  Pain : 0-10 Pain Score: 10-Worst pain ever Pain Type: Chronic pain Pain Location: Back Pain Onset: More than a month ago Pain Frequency: Intermittent     Nutritional Risks: None Diabetes: Yes CBG done?: No Did pt. bring in CBG monitor from home?: No  How often do you need to have someone help you when you read instructions, pamphlets, or other written materials from your doctor or pharmacy?: 1 - Never What is the last grade level you completed in school?: 12th, tech school  Diabetic: Yes Nutrition Risk Assessment:  Has the patient had any N/V/D within the last 2 months?  No  Does the patient have any non-healing wounds?  No  Has the patient had any unintentional weight loss or weight gain?  No   Diabetes:  Is the patient diabetic?  Yes  If diabetic, was a CBG obtained today?  No  telephone visit Did the patient bring in their glucometer from home?  No  telephone visit  How often do you monitor your CBG's? Once in the morning.   Financial Strains and Diabetes Management:  Are you having any financial strains with the device, your supplies or your medication? No .  Does the patient want to be seen by Chronic Care Management for management of their diabetes?  No  Would the patient like to be referred to a Nutritionist or for Diabetic Management?  No   Diabetic Exams:  Diabetic  Eye Exam: Completed 01/01/2020 Diabetic Foot Exam: Overdue, Pt has been advised about the importance in completing this exam.  Interpreter Needed?: No  Information entered by :: CJohnson, LPN   Activities of Daily Living In your present state of health, do you have any difficulty performing the following activities: 09/20/2020  Hearing? Y  Comment some hearing issues noted  Vision? N  Difficulty concentrating or making decisions? Y  Comment some short term memory problems  Walking or climbing stairs? N  Dressing or bathing? N  Doing errands, shopping? N  Preparing Food and eating ? N  Using the Toilet? N  In the past six months, have you accidently leaked urine? N  Do you have problems with loss of bowel control? N  Managing your Medications? N  Managing your Finances? N  Housekeeping or managing your Housekeeping? N  Some recent data might be hidden    Patient Care Team: Tonia Ghent, MD as PCP - General (Family Medicine) Adrian Prows, MD as Consulting Physician (Cardiology) Debbora Dus, [facility] as Pharmacist (Pharmacist)  Indicate any recent Medical Services you may have received from other than [facility] providers in the past year (date may be approximate).     Assessment:   This is a routine wellness examination for [place].  Hearing/Vision screen  Hearing Screening   125Hz  250Hz  500Hz  1000Hz  2000Hz  3000Hz  4000Hz  6000Hz  8000Hz   Right ear:           Left ear:           Vision Screening Comments: Patient gets annual eye exams   Dietary issues and exercise activities discussed: Current Exercise Habits: The patient does not participate in regular exercise at present, Exercise limited by: None identified  Goals    . Patient Stated     09/20/2020, I will maintain and continue medications as prescribed.       Depression Screen PHQ 2/9 Scores 09/20/2020 04/21/2018 10/11/2017 09/21/2017 02/16/2013  PHQ - 2 Score 0 0 0 0 0  PHQ- 9 Score 0 - - - -    Fall Risk Fall Risk   09/20/2020 01/11/2020 04/21/2018 10/11/2017 09/21/2017  Falls in the past year? 1 1 No Yes Yes  Number falls in past yr: 1 1 - 2 or more 2 or more  Comment - - - - pt states he has falles 3 or 4 times in the past year, inside and outside  Injury with Fall? 0 1 - No No  Risk for fall due to : History of fall(s);Medication side effect - - - (No Data)  Risk for fall due to: Comment - - - - pt states sometimes he gets dizzy and loses balance  Follow up Falls evaluation completed;Falls prevention discussed - - - Falls prevention discussed    FALL RISK PREVENTION PERTAINING TO THE HOME:  Any stairs in or around the home? Yes  If so, are there any without handrails? No  Home free of loose throw rugs in walkways, pet beds, electrical cords, etc? Yes  Adequate lighting in your home to reduce risk of falls? Yes   ASSISTIVE DEVICES UTILIZED TO PREVENT FALLS:  Life alert? No  Use of a cane, walker or w/c? No  Grab bars in the bathroom? No  Shower chair or bench in shower? No  Elevated toilet seat or a handicapped toilet? No   TIMED UP AND GO:  Was the test performed? N/A telephone visit .    Cognitive Function: MMSE - Mini Mental State Exam 09/20/2020  Orientation to time 5  Orientation to Place 5  Registration 3  Attention/ Calculation 5  Recall  3  Language- repeat 1       Mini Cog  Mini-Cog screen was completed. Maximum score is 22. A value of 0 denotes this part of the MMSE was not completed or the patient failed this part of the Mini-Cog screening.  Immunizations Immunization History  Administered Date(s) Administered  . Influenza Inj Mdck Quad Pf 06/02/2018  . Influenza Whole 09/17/2010  . Influenza,inj,Quad PF,6+ Mos 06/08/2013, 05/22/2014, 05/12/2016, 05/08/2019, 05/27/2020  . Influenza-Unspecified 06/08/2013, 05/22/2014, 06/08/2015, 05/12/2016, 05/21/2017, 06/02/2018, 05/09/2019  . PFIZER SARS-COV-2 Vaccination 11/27/2019, 12/25/2019, 08/07/2020  . Pneumococcal  Polysaccharide-23 09/08/2007, 02/16/2013  . Td 09/07/2009    TDAP status: Due, Education has been provided regarding the importance of this vaccine. Advised may receive this vaccine at local pharmacy or Health Dept. Aware to provide a copy of the vaccination record if obtained from local pharmacy or Health Dept. Verbalized acceptance and understanding.  Flu Vaccine status: Up to date  Pneumococcal vaccine status: Up to date  Covid-19 vaccine status: Completed vaccines  Qualifies for Shingles Vaccine? Yes   Zostavax completed No   Shingrix Completed?: No.    Education has been provided regarding the importance of this vaccine. Patient has been advised to call insurance company to determine out of pocket expense if they have not yet received this vaccine. Advised may also receive vaccine at local pharmacy or Health Dept. Verbalized acceptance and understanding.  Screening Tests Health Maintenance  Topic Date Due  . FOOT EXAM  12/08/2018  . COLONOSCOPY (Pts 45-64yrs Insurance coverage will need to be confirmed)  09/20/2021 (Originally 03/23/2018)  . TETANUS/TDAP  09/21/2023 (Originally 09/08/2019)  . OPHTHALMOLOGY EXAM  12/31/2020  . HEMOGLOBIN A1C  01/08/2021  . INFLUENZA VACCINE  Completed  . PNEUMOCOCCAL POLYSACCHARIDE VACCINE AGE 75-64 HIGH RISK  Completed  . COVID-19 Vaccine  Completed  . Hepatitis C Screening  Completed  . HIV Screening  Completed    Health Maintenance  Health Maintenance Due  Topic Date Due  . FOOT EXAM  12/08/2018    Colorectal cancer screening: declined  Lung Cancer Screening: (Low Dose CT Chest recommended if Age 89-80 years, 30 pack-year currently smoking OR have quit w/in 15years.) does not qualify.    Additional Screening:  Hepatitis C Screening: does not qualify; Completed 11/15/2015  Vision Screening: Recommended annual ophthalmology exams for early detection of glaucoma and other disorders of the eye. Is the patient up to date with their annual  eye exam?  Yes  Who is the provider or what is the name of the office in which the patient attends annual eye exams? [facility] If pt is not established with a provider, would they like to be referred to a provider to establish care? No .   Dental Screening: Recommended annual dental exams for proper oral hygiene  Community Resource Referral / Chronic Care Management: CRR required this visit?  No   CCM required this visit?  No      Plan:     I have personally reviewed and noted the following in the patient's chart:   . Medical and social history . Use of alcohol, tobacco or illicit drugs  . Current medications and supplements . Functional ability and status . Nutritional status . Physical activity . Advanced directives . List of other physicians . Hospitalizations, surgeries, and ER visits in previous 12 months . Vitals . Screenings to include cognitive, depression, and falls . Referrals and appointments  In addition, I have reviewed and discussed with patient certain preventive  protocols, quality metrics, and best practice recommendations. A written personalized care plan for preventive services as well as general preventive health recommendations were provided to patient.   Due to this being a telephonic visit, the after visit summary with patients personalized plan was offered to patient via office or my-chart. Patient preferred to pick up at office at next visit or via mychart.   Andrez Grime, LPN   2/95/[id number]

## 2020-09-30 DIAGNOSIS — M5416 Radiculopathy, lumbar region: Secondary | ICD-10-CM | POA: Diagnosis not present

## 2020-10-01 ENCOUNTER — Ambulatory Visit (INDEPENDENT_AMBULATORY_CARE_PROVIDER_SITE_OTHER): Payer: Medicare HMO | Admitting: Neurology

## 2020-10-01 ENCOUNTER — Telehealth: Payer: Self-pay

## 2020-10-01 ENCOUNTER — Other Ambulatory Visit: Payer: Self-pay

## 2020-10-01 DIAGNOSIS — G5601 Carpal tunnel syndrome, right upper limb: Secondary | ICD-10-CM

## 2020-10-01 DIAGNOSIS — R29898 Other symptoms and signs involving the musculoskeletal system: Secondary | ICD-10-CM

## 2020-10-01 DIAGNOSIS — M5412 Radiculopathy, cervical region: Secondary | ICD-10-CM

## 2020-10-01 NOTE — Chronic Care Management (AMB) (Addendum)
Chronic Care Management Pharmacy Assistant   Name: Tony Manning  MRN: [id number] DOB: 01-Feb-1961  Reason for Encounter: Initial questions for CCM visit 10/04/20   PCP : Tonia Ghent, MD  Allergies:   Allergies  Allergen Reactions   Ambien [Zolpidem] Other (See Comments)    Parasomnias, sleep walking   Lisinopril Cough   Tramadol Diarrhea    Medications: Outpatient Encounter Medications as of 10/01/2020  Medication Sig   amitriptyline (ELAVIL) 10 MG tablet Take 1 tablet (10 mg total) by mouth at bedtime. (Patient not taking: No sig reported)   amLODipine (NORVASC) 10 MG tablet Take 1 tablet (10 mg total) by mouth daily.   aspirin 81 MG chewable tablet Chew 81 mg by mouth at bedtime.   atorvastatin (LIPITOR) 40 MG tablet TAKE 1 TABLET (40 MG TOTAL) BY MOUTH DAILY. NEEDS OFFICE VISIT   carvedilol (COREG) 6.25 MG tablet TAKE 1 TABLET BY MOUTH TWICE A DAY   diclofenac Sodium (VOLTAREN) 1 % GEL Apply 2-4 g topically 4 (four) times daily.   glimepiride (AMARYL) 4 MG tablet TAKE 1 TABLET BY MOUTH TWICE A DAY   glucose blood (ONE TOUCH ULTRA TEST) test strip Check sugar up to 6 times a day.  DM2, insulin treated, uncontrolled.   ibuprofen (ADVIL) 600 MG tablet Take 600 mg by mouth every 8 (eight) hours as needed.   insulin NPH Human (NOVOLIN N) 100 UNIT/ML injection Inject 0.2 mLs (20 Units total) into the skin 2 (two) times daily before a meal.   Insulin Pen Needle 32G X 4 MM MISC Use daily with insulin pen.   Insulin Syringe-Needle U-100 (INSULIN SYRINGE 1CC/31GX5/16") 31G X 5/16" 1 ML MISC Use with NPH insulin twice daily.   isosorbide mononitrate (IMDUR) 30 MG 24 hr tablet Take 1 tablet (30 mg total) by mouth daily.   losartan (COZAAR) 100 MG tablet TAKE 1 TABLET BY MOUTH EVERY DAY   metFORMIN (GLUCOPHAGE) 1000 MG tablet TAKE 1 TABLET BY MOUTH TWICE A DAY WITH MEALS   nitroGLYCERIN (NITROSTAT) 0.4 MG SL tablet Place 1 tablet (0.4 mg total) under the tongue every 5 (five)  minutes as needed for chest pain.   ondansetron (ZOFRAN) 4 MG tablet Take 4 mg by mouth every 8 (eight) hours as needed.   oxyCODONE (OXY IR/ROXICODONE) 5 MG immediate release tablet Take 5 mg by mouth every 6 (six) hours as needed.   pantoprazole (PROTONIX) 40 MG tablet TAKE 1 TABLET BY MOUTH EVERY DAY   tamsulosin (FLOMAX) 0.4 MG CAPS capsule TAKE 1 CAPSULE BY MOUTH EVERY DAY   tiZANidine (ZANAFLEX) 4 MG tablet Take 0.5-1 tablets (2-4 mg total) by mouth every 8 (eight) hours as needed for muscle spasms (Sedation caution).   No facility-administered encounter medications on file as of 10/01/2020.    Current Diagnosis: Patient Active Problem List   Diagnosis Date Noted   Nocturia 05/03/2020   Right sided weakness 03/06/2020   Low back pain 01/29/2020   Facial weakness 11/16/2019   Hematuria 03/22/2018   Renal stones 03/22/2018   Vitamin D deficiency, unspecified 10/04/2017   Chronic neck pain (Primary Area of Pain) (B) (L>R) 09/21/2017   Chronic upper extremity pain (Secondary Area of Pain) (Bilateral) (L>R) 09/21/2017   Chronic left shoulder pain (Tertiary Area of Pain) 09/21/2017   Chronic low back pain (Fourth Area of Pain) (B) (R>L) 09/21/2017   Chronic pain of lower extremity (B) (R>L) 09/21/2017   Other long term (current) drug therapy 09/21/2017  Other specified health status 09/21/2017   Other chronic pain 08/22/2017   Other fatigue 05/10/2017   Radiculopathy 04/07/2017   Angina pectoris ([place]) 10/15/2016   Cervical radiculitis 07/23/2016   Dysphagia 06/12/2016   Sciatica 02/06/2016   Plantar fasciitis 01/16/2016   Right sided sciatica 05/16/2015   Hand pain 04/05/2015   Premature ejaculation 09/17/2014   Heel pain 09/17/2014   NSTEMI (non-ST elevated myocardial infarction) ([place]) 06/13/2014   Atypical chest pain 06/06/2014   Memory change 05/24/2014   Benign paroxysmal positional vertigo 07/13/2013   Orthostasis 07/13/2013   Migraine, unspecified, without mention of  intractable migraine without mention of status migrainosus 04/03/2013   Medicare annual wellness visit, initial 02/17/2013   Pain in joint, ankle and foot 10/19/2012   Irritable mood 01/18/2012   Tobacco abuse 12/21/2011   Neck pain 12/03/2010   Hearing loss 12/03/2010   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 Yes   10/01/20- Dr. Narda Amber- Neurology- Procedure- NCV/EMG  07/23/20- Lawerance Cruel, PA-C- Cardiology   07/22/20- Dr. Tania Ade- orthopedics  07/12/20- Lawerance Cruel, PA-C cardiology  Any changes in your medications or health?  08/13/20- Dr. [name] started patient on tizanidine.  07/29/20- Celeste Cantwell, PA-C increased amlodipine from 5 mg to 10 mg daily.   07/23/20- Celeste Cantwell started patient on Amlodipine 5 mg.   07/12/20- Celeste Cantwell started patient on Isosorbide Mononitrate 30 mg  Any side effects from any medications? No  Do you have an symptoms or problems not managed by your medications? States he has chronic back pain that no one seems to be able to help.   Any concerns about your health right now? Concerned about his hearing.   Has your provider asked that you check blood pressure, blood sugar, or follow special diet at home?  Checks blood pressure periodically. Unable to check blood sugar due to drug store being out of stock of his test strips.  Do you get any type of exercise on a regular basis? No formal exercise. States he is active. He is supposed to start working part time  tomorrow 10/03/20.   Can you think of a goal you would like to reach for your health? None  Do you have any problems getting your medications? Drug store does not have strips in stock. States it has been a while since he was able to purchase them.  Is there anything that you would like to discuss during the appointment? Not that he can think of  Patient reminded to have all medications, supplements and any blood sugar and blood pressure readings available for review with Debbora Dus, Pharm. D, at their telephone visit on 10/04/20.    Follow-Up:  Pharmacist Review   Debbora Dus, CPP notified  Margaretmary Dys, [place] Pharmacy Assistant [phone]

## 2020-10-01 NOTE — Procedures (Signed)
[facility]  [place], [address]  [place], [place] [address] Tel: [phone] Fax:  [phone] Test Date:  10/01/2020  Patient: Tony Manning DOB: 1960-10-15 Physician: Narda Amber, DO  Sex: Male Height: 5\' 10"  Ref Phys: Metta Clines, D.O.  ID#: [id number]   Technician:    Patient Complaints: This is a 60 year old man with history of cervical surgery and right CTS release referred for evaluation of right-sided weakness.  NCV & EMG Findings: Extensive electrodiagnostic testing of the right upper and lower extremity shows:  1. Right mixed palmar sensory response shows prolonged latency.  Right median, ulnar, sural, and superficial peroneal sensory responses are within normal limits. 2. Right median motor response was prolonged latency (4.1 ms).  Right ulnar, peroneal, and tibial motor responses are within normal limits. 3. Right tibial H reflex study is within normal limits. 4. In the right upper extremity, chronic motor axonal loss changes are seen affecting the abductor pollicis brevis, pronator teres, biceps, and triceps muscles.  There is no evidence of accompanied active denervation. 5. In the right lower extremity, there is no evidence of active or chronic motor axonal loss changes affecting any of the tested muscles.  Motor unit configuration and recruitment pattern is within normal limits.  Impression: 1. Chronic C5-7 radiculopathy affecting the right upper extremity, mild. 2. Residuals of a previously treated right carpal tunnel syndrome, mild. 3. There is no evidence of a sensorimotor polyneuropathy or lumbosacral radiculopathy affecting the right lower extremity.   ___________________________ Narda Amber, DO    Nerve Conduction Studies Anti Sensory Summary Table   Stim Site NR Peak (ms) Norm Peak (ms) P-T Amp (V) Norm P-T Amp  Right Median Anti Sensory (2nd Digit)  32C  Wrist    3.7 <3.8 22.2 >10  Right Sup Peroneal Anti Sensory (Ant Lat Mall)   32C  12 cm    4.0 <4.6 4.4 >3  Right Sural Anti Sensory (Lat Mall)  32C  Calf    2.8 <4.6 7.0 >3  Right Ulnar Anti Sensory (5th Digit)  32C  Wrist    3.0 <3.2 20.7 >5   Motor Summary Table   Stim Site NR Onset (ms) Norm Onset (ms) O-P Amp (mV) Norm O-P Amp Site1 Site2 Delta-0 (ms) Dist (cm) Vel (m/s) Norm Vel (m/s)  Right Median Motor (Abd Poll Brev)  32C  Wrist    4.1 <4.0 5.7 >5 Elbow Wrist 4.8 31.0 65 >50  Elbow    8.9  5.0         Right Peroneal Motor (Ext Dig Brev)  32C  Ankle    4.1 <6.0 5.0 >2.5 B Fib Ankle 8.3 38.0 46 >40  B Fib    12.4  4.6  Poplt B Fib 1.7 9.0 53 >40  Poplt    14.1  4.5         Right Tibial Motor (Abd Hall Brev)  32C  Ankle    5.5 <6.0 8.0 >4 Knee Ankle 10.0 42.0 42 >40  Knee    15.5  5.9         Right Ulnar Motor (Abd Dig Minimi)  32C  Wrist    2.7 <3.1 10.4 >7 B Elbow Wrist 4.7 24.0 51 >50  B Elbow    7.4  9.3  A Elbow B Elbow 1.8 10.0 56 >50  A Elbow    9.2  9.1          Comparison Summary Table   Stim Site NR  Peak (ms) Norm Peak (ms) P-T Amp (V) Site1 Site2 Delta-P (ms) Norm Delta (ms)  Right Median/Ulnar Palm Comparison (Wrist - 8cm)  32C  Median Palm    2.1 <2.2 19.8 Median Palm Ulnar Palm 0.4   Ulnar Palm    1.7 <2.2 8.3       H Reflex Studies   NR H-Lat (ms) Lat Norm (ms) L-R H-Lat (ms)  Right Tibial (Gastroc)  32C     34.15 <35    EMG   Side Muscle Ins Act Fibs Psw Fasc Number Recrt Dur Dur. Amp Amp. Poly Poly. Comment  Right AntTibialis Nml Nml Nml Nml Nml Nml Nml Nml Nml Nml Nml Nml N/A  Right Gastroc Nml Nml Nml Nml Nml Nml Nml Nml Nml Nml Nml Nml N/A  Right Flex Dig Long Nml Nml Nml Nml Nml Nml Nml Nml Nml Nml Nml Nml N/A  Right RectFemoris Nml Nml Nml Nml Nml Nml Nml Nml Nml Nml Nml Nml N/A  Right GluteusMed Nml Nml Nml Nml Nml Nml Nml Nml Nml Nml Nml Nml N/A  Right 1stDorInt Nml Nml Nml Nml Nml Nml Nml Nml Nml Nml Nml Nml N/A  Right Abd Poll Brev Nml Nml Nml Nml 1- Rapid Some 1+ Some 1+ Some 1+ N/A  Right  PronatorTeres Nml Nml Nml Nml 1- Rapid Some 1+ Some 1+ Some 1+ N/A  Right Biceps Nml Nml Nml Nml 1- Rapid Some 1+ Some 1+ Some 1+ N/A  Right Triceps Nml Nml Nml Nml 1- Rapid Some 1+ Some 1+ Some 1+ N/A  Right Deltoid Nml Nml Nml Nml Nml Nml Nml Nml Nml Nml Nml Nml N/A      Waveforms:

## 2020-10-02 ENCOUNTER — Telehealth: Payer: Self-pay

## 2020-10-02 DIAGNOSIS — R29898 Other symptoms and signs involving the musculoskeletal system: Secondary | ICD-10-CM

## 2020-10-02 NOTE — Telephone Encounter (Signed)
-----   Message from Pieter Partridge, DO sent at 10/02/2020  7:21 AM EST ----- Nerve study shows chronic but mild changes of nerve irritation in the neck.  As he endorses right arm weakness, I would like to check MRI of cervical spine without contrast.  He needs to make a follow up appointment.

## 2020-10-02 NOTE — Telephone Encounter (Signed)
Pt advised of EMG results. MRI cervical spine w/o contrast ordered

## 2020-10-03 NOTE — Chronic Care Management (AMB) (Signed)
Chronic Care Management Pharmacy  Name: Tony Manning  MRN: [id number] DOB: 1960/12/25  Initial Questions reviewed.  Chief Complaint/ HPI  Tony Manning,  60 y.o. , male presents for their Initial CCM visit with the clinical pharmacist via telephone.  PCP : Tonia Ghent, MD  Their chronic conditions include: HTN, CAD, Sleep apnea, DM, OA, chronic pain, HLD, depression, tobacco use, vitamin D deficiency  CCM Consent 09/02/20  Patient concerns: [facility]   Office Visits: 08/12/20: Patient call - shoulder blade spasms, try tizanidine 2-4 mg - q8h  07/11/20: A1c slightly better, continue same at this time, hold off on changes until cardiology changes completed 05/17/20: Nocturia - Still with nocturia q2 hours.  No help with higher dose of flomax, ie still with nocturia.  No burning with urination.  His stream is still okay at this point.  He cut back on fluid in the PMs.  No diuretics taken at night.  Continue flomax same now. Try adding oxybutynin.   Consult Visit: 07/29/20: MRI of brain is unremarkable.  No concerning findings to explain the right sided weakness. 07/29/20: Cardiology - Increased amlodipine from 5 mg to 10 mg daily.  07/23/20: Cardiology - Patient presents for 2-week follow-up of anginal symptoms.  He states Imdur has mildly improved severity as well as frequency of symptoms however he continues to have relatively frequent episodes of anginal pain. Will continue Imdur.  He has not taken nitroglycerin.  At last office visit patient had opted for medical management, however now recommend further ischemic evaluation. Start on amlodipine 5 mg daily for elevated BP. Check home BP in 2 weeks. 07/12/20: Cardiology - Start Isosorbide Mononitrate 30 mg 06/27/20: Office visit cancelled 06/05/20: Calculus of kidney - no documentation 05/03/20: Cardiology - He is presently doing well and has not had any significant anginal  episodes, blood pressure is well controlled and lipids are at goal.  Only continued risk factor is uncontrolled diabetes with stage III chronic kidney disease.  Fortunately he has not had any recent hospitalization.  He is presently on dual antiplatelet therapy I will discontinue Plavix as he has completed the course.   Allergies  Allergen Reactions  . Ambien [Zolpidem] Other (See Comments)    Parasomnias, sleep walking  . Lisinopril Cough  . Tramadol Diarrhea    Medications: Outpatient Encounter Medications as of 10/04/2020  Medication Sig  . amitriptyline (ELAVIL) 10 MG tablet Take 1 tablet (10 mg total) by mouth at bedtime. (Patient not taking: No sig reported)  . amLODipine (NORVASC) 10 MG tablet Take 1 tablet (10 mg total) by mouth daily.  [name] Kitchen aspirin 81 MG chewable tablet Chew 81 mg by mouth at bedtime.  [name] Kitchen atorvastatin (LIPITOR) 40 MG tablet TAKE 1 TABLET (40 MG TOTAL) BY MOUTH DAILY. NEEDS OFFICE VISIT  . carvedilol (COREG) 6.25 MG tablet TAKE 1 TABLET BY MOUTH TWICE A DAY  . diclofenac Sodium (VOLTAREN) 1 % GEL Apply 2-4 g topically 4 (four) times daily.  [name] Kitchen glimepiride (AMARYL) 4 MG tablet TAKE 1 TABLET BY MOUTH TWICE A DAY  . glucose blood (ONE TOUCH ULTRA TEST) test strip Check sugar up to 6 times a day.  DM2, insulin treated, uncontrolled.  [name] Kitchen ibuprofen (ADVIL) 600 MG tablet Take 600 mg by mouth every 8 (eight) hours as needed.  . insulin NPH Human (NOVOLIN N) 100 UNIT/ML injection Inject 0.2 mLs (20 Units total) into the skin 2 (two) times daily  before a meal.  . Insulin Pen Needle 32G X 4 MM MISC Use daily with insulin pen.  . Insulin Syringe-Needle U-100 (INSULIN SYRINGE 1CC/31GX5/16") 31G X 5/16" 1 ML MISC Use with NPH insulin twice daily.  . isosorbide mononitrate (IMDUR) 30 MG 24 hr tablet Take 1 tablet (30 mg total) by mouth daily.  [name] Kitchen losartan (COZAAR) 100 MG tablet TAKE 1 TABLET BY MOUTH EVERY DAY  . metFORMIN (GLUCOPHAGE) 1000 MG tablet TAKE 1 TABLET BY MOUTH TWICE A DAY  WITH MEALS  . nitroGLYCERIN (NITROSTAT) 0.4 MG SL tablet Place 1 tablet (0.4 mg total) under the tongue every 5 (five) minutes as needed for chest pain.  [name] Kitchen ondansetron (ZOFRAN) 4 MG tablet Take 4 mg by mouth every 8 (eight) hours as needed.  [name] Kitchen oxyCODONE (OXY IR/ROXICODONE) 5 MG immediate release tablet Take 5 mg by mouth every 6 (six) hours as needed.  . pantoprazole (PROTONIX) 40 MG tablet TAKE 1 TABLET BY MOUTH EVERY DAY  . tamsulosin (FLOMAX) 0.4 MG CAPS capsule TAKE 1 CAPSULE BY MOUTH EVERY DAY  . tiZANidine (ZANAFLEX) 4 MG tablet Take 0.5-1 tablets (2-4 mg total) by mouth every 8 (eight) hours as needed for muscle spasms (Sedation caution).   No facility-administered encounter medications on file as of 10/04/2020.     Current Diagnosis/Assessment: Goals    . Patient Stated     09/20/2020, I will maintain and continue medications as prescribed.     . Pharmacy Care Plan     CARE PLAN ENTRY (see longitudinal plan of care for additional care plan information)  Current Barriers:  . Chronic Disease Management support, education, and care coordination needs related to Hypertension and Diabetes   Hypertension BP Readings from Last 3 Encounters:  07/23/20 (!) 148/67  07/12/20 136/61  07/11/20 140/78   . Pharmacist Clinical Goal(s): o Over the next 90 days, patient will work with PharmD and providers to maintain BP goal <130/80 . Current regimen:   Amlodipine 10 mg - 1 tablet daily (morning)  Carvedilol 6.25 mg - 1 tablet twice daily (morning and bedtime)  Losartan 100 mg - 1 tablet daily (bedtime) . Interventions: o Reviewed home BP monitoring: Checks once daily in the morning.BP is 119/67 mmHg today during visit using home Omron arm cuff o  Yesterday - 127/64 Other this month - 118/73, 120/74  . Patient self care activities - Over the next 90 days, patient will: o Check BP weekly, document, and provide at future appointments o Ensure daily salt intake < 2300  mg/day   Diabetes Lab Results  Component Value Date/Time   HGBA1C 7.6 (A) 07/11/2020 08:55 AM   HGBA1C 7.8 (H) 03/05/2020 02:46 PM   HGBA1C 7.8 (A) 11/16/2019 08:32 AM   HGBA1C 7.9 (H) 05/11/2019 12:14 PM   . Pharmacist Clinical Goal(s): o Over the next 30 days, patient will work with PharmD and providers to achieve A1c goal <7% . Current regimen:   Glimepiride 4 mg - 1 tablet twice daily  Insulin NPH Vial - Inject 20 units into skin twice daily before meals   Metformin 1000 mg - 1 tablet twice daily with meals . Interventions: o Coordinate affordable test strips o Coordinate switch of insulin to pen injector o Coordinate follow up to discuss PAP for insulin   . Patient self care activities - Over the next 30 days, patient will: o Check blood sugar once daily, document, and provide at future appointments o Contact provider with any episodes of hypoglycemia  Medication  management . Pharmacist Clinical Goal(s): o Over the next 3 months, patient will work with PharmD and providers to achieve optimal medication adherence . [facility] [place] . Interventions o Comprehensive medication review performed. o Utilize [facility] pharmacy for medication synchronization, packaging and delivery . Patient self care activities - Over the next 3 months, patient will: o Focus on medication adherence by utilizing adherence packaging  o Take medications as prescribed o Report any questions or concerns to PharmD and/or provider(s)  Initial goal documentation       Hypertension   CMP Latest Ref Rng & Units 03/05/2020 05/29/2019 05/18/2019  Glucose 70 - 99 mg/dL 80 88 203(H)  BUN 6 - 23 mg/dL 18 20 29(H)  Creatinine 0.40 - 1.50 mg/dL 1.43 1.43 1.66(H)  Sodium 135 - 145 mEq/L 141 141 140  Potassium 3.5 - 5.1 mEq/L 4.4 4.7 4.7  Chloride 96 - 112 mEq/L 107 106 108  CO2 19 - 32 mEq/L $Remove'27 28 26  'kvjdvfk$ Calcium 8.4 - 10.5 mg/dL 9.8 10.1 9.6  Total Protein 6.0 - 8.3 g/dL 7.5 - -  Total  Bilirubin 0.2 - 1.2 mg/dL 0.5 - -  Alkaline Phos 39 - 117 U/L 67 - -  AST 0 - 37 U/L 20 - -  ALT 0 - 53 U/L 21 - -   Office blood pressures are: BP Readings from Last 3 Encounters:  07/23/20 (!) 148/67  07/12/20 136/61  07/11/20 140/78   BP goal < 130/80 mmHg  Patient has failed these meds in the past: lisinopril - cough Patient checks BP at home daily  Patient is currently controlled on the following medications:   Amlodipine 10 mg - 1 tablet daily (morning)  Carvedilol 6.25 mg - 1 tablet twice daily (morning and bedtime)  Losartan 100 mg - 1 tablet daily (bedtime)  We discussed:  BP is 119/67 mmHg today during visit using home Omron arm cuff Checks once daily in the morning. Yesterday - 127/64 Other this month - 118/73, 120/74   Plan: Continue current medications  Hyperlipidemia   LDL goal < 70 (CAD)  Last lipids Lab Results  Component Value Date   CHOL 120 03/05/2020   HDL 43.00 03/05/2020   LDLCALC 59 03/05/2020   TRIG 89.0 03/05/2020   CHOLHDL 3 03/05/2020   Hepatic Function Latest Ref Rng & Units 03/05/2020 05/05/2019 05/05/2018  Total Protein 6.0 - 8.3 g/dL 7.5 7.8 7.7  Albumin 3.5 - 5.2 g/dL 4.6 4.1 4.5  AST 0 - 37 U/L $Remo'20 17 17  'oVgpl$ ALT 0 - 53 U/L $Remo'21 20 23  'UBLrz$ Alk Phosphatase 39 - 117 U/L 67 77 77  Total Bilirubin 0.2 - 1.2 mg/dL 0.5 1.1 0.4  Bilirubin, Direct 0.0 - 0.3 mg/dL - - -     The ASCVD Risk score ([place]., et al., 2013) failed to calculate for the following reasons:   The patient has a prior MI or stroke diagnosis   Patient has failed these meds in past: none reported Patient is currently controlled on the following medications:  . Atorvastatin 40 mg - 1 tablet daily (morning) . Aspirin 81 mg - 1 tablet daily (morning) . Isosorbide Mononitrate 30 mg - 1 tablet daily (bedtime) . Nitroglycerin 0.4 mg SL - PRN   We discussed: denies any concerns, LDL at goal  Plan: Continue current medications  Diabetes   07/13/19 - last visit with  endocrinology, seems lost to follow up  Recent Relevant Labs: Lab Results  Component Value Date/Time  HGBA1C 7.6 (A) 07/11/2020 08:55 AM   HGBA1C 7.8 (H) 03/05/2020 02:46 PM   HGBA1C 7.8 (A) 11/16/2019 08:32 AM   HGBA1C 7.9 (H) 05/11/2019 12:14 PM   MICROALBUR 1.5 08/15/2012 09:05 AM    A1c goal < 7% Checking BG: has not checked in about 4 weeks. [facility]ses [facility] because the other brands were too expensive and these are out of stock. He is also interested in Dexcom, however, unlikely to be covered due to not injected insulin three times daily.  Today - found 1 test strip during call, BG 184, has not eaten yet today Diet:  Eats one large evening meal most days. Not hungry at breakfast. May eat lunch twice per week.   Drinking 3 sodas a day, likes caffeine in the morning. Drinks Sweet tea occasionally. Denies juices. Dislikes diet sodas. Drinks Therapist, nutritional. Reports this is a decrease from his previous soda intake.   Patient has failed these meds in past: none  Patient is currently uncontrolled on the following medications:   Glimepiride 4 mg - 1 tablet twice daily  Insulin NPH Vial - Inject 20 units into skin BID before meals   Metformin 1000 mg - 1 tablet BID with meals  Last diabetic eye exam:  Lab Results  Component Value Date/Time   HMDIABEYEEXA Retinopathy (A) 01/01/2020 12:00 AM    Last diabetic foot exam:  12/07/17 - due   Update 10/04/20: Patient reports taking 20 units once daily to save on insulin cost. The price stays at $47 per 30 day refill usually. Reports he used to be on pen but it was too expensive. His hands are shaky, vision poor, would like pen. Let him know price is the same. Would like to switch back to pen now that price is the same. Pt reports he would be open to something like Ozempic if affordable.  Plan: Continue current medications; Contacted insurance to determine preferred brand for glucometer and new rx was sent In for patient. Begin checking BG  daily. Cut back on sodas. Switch insulin to flexpen (same price). Resume BID dosing. Apply for patient assistance if needed. CMA follow up to coordinate.    Chronic Pain    Patient has failed these meds in past: Tylenol, methocarbamol, ketorolac, tizanidine - reports all of these are ineffective Patient is currently controlled on the following medications:   Oxycodone - PRN kidney stones per pt report  Amitriptyline - PRN muscle pain per pt report  Cyclobenzaprine - PRN muscle pain per pt report  Voltaren Gel 1% - uses PRN for arm and hand pain (hasnt used in last 2 weeks).   Reports he takes the cyclobenzaprine PRN sparingly, has not taken in > 30 days. Takes amitriptyline PRN sparingly due to next day grogginess. Doing PT, sees [facility]. Also receives Cortisone injections for pain in legs   We discussed: Avoid NSAIDs. Discard old rx of ketorolac. Not safe for self-administration.   Plan: Continue current medications  Medication Management   Allergies  Allergen Reactions  . Ambien [Zolpidem] Other (See Comments)    Parasomnias, sleep walking  . Lisinopril Cough  . Tramadol Diarrhea   Patient's preferred pharmacy [facility]:  [id number] [place], [address] [place] [address] Phone: [phone] Fax: [phone]  Adherence packaging preferred. Takes all in the morning or at bedtime.  Uses pill box? Yes - fills it out a couple weeks in advance  We discussed: Benefits of medication synchronization, packaging  and delivery as well as enhanced pharmacist oversight with [facility].  Plan Utilize [facility] pharmacy for medication synchronization, packaging and delivery  Follow up: 2 month phone visit  Debbora Dus, PharmD Clinical Pharmacist [facility] at [facility] [phone]

## 2020-10-04 ENCOUNTER — Other Ambulatory Visit: Payer: Self-pay

## 2020-10-04 ENCOUNTER — Telehealth: Payer: Self-pay

## 2020-10-04 ENCOUNTER — Telehealth: Payer: Self-pay | Admitting: Family Medicine

## 2020-10-04 ENCOUNTER — Ambulatory Visit: Payer: Medicare HMO

## 2020-10-04 DIAGNOSIS — E1149 Type 2 diabetes mellitus with other diabetic neurological complication: Secondary | ICD-10-CM

## 2020-10-04 DIAGNOSIS — E7800 Pure hypercholesterolemia, unspecified: Secondary | ICD-10-CM

## 2020-10-04 DIAGNOSIS — I10 Essential (primary) hypertension: Secondary | ICD-10-CM

## 2020-10-04 DIAGNOSIS — Z794 Long term (current) use of insulin: Secondary | ICD-10-CM

## 2020-10-04 NOTE — Telephone Encounter (Signed)
Pt called in wanted to know about why he is was billed for phone call . And he called [facility] and they said it didn't go through insurance

## 2020-10-04 NOTE — Telephone Encounter (Signed)
Patient reports he is due for colonoscopy and would like assistance scheduling.   Debbora Dus, PharmD Clinical Pharmacist [facility] at [facility] [phone]

## 2020-10-06 NOTE — Telephone Encounter (Signed)
Dr. Archer Asa have your staff contact this patient about scheduling follow-up colonoscopy.  I appreciate your help.

## 2020-10-07 NOTE — Telephone Encounter (Signed)
Please schedule surveillance colon in [place] Hx of polyps Thanks JMP

## 2020-10-07 NOTE — Telephone Encounter (Signed)
He should be fine for just previsit and colon based on Dr. Vena Rua note.

## 2020-10-07 NOTE — Telephone Encounter (Signed)
Please see note below and schedule pt for Colon in the [place].

## 2020-10-08 ENCOUNTER — Telehealth: Payer: Self-pay | Admitting: Internal Medicine

## 2020-10-08 ENCOUNTER — Telehealth: Payer: Self-pay

## 2020-10-08 NOTE — Telephone Encounter (Signed)
Attempted to reach pt to discuss. I do not see where he owes a balance. Lvm asking him to call office.

## 2020-10-08 NOTE — Chronic Care Management (AMB) (Addendum)
Chronic Care Management Pharmacy Assistant   Name: Tony Manning  MRN: [id number] DOB: [birth year]  Reason for Encounter: Medication Review-  Glucometer Insurance Coverage Verification  PCP : Tonia Ghent, MD  Allergies:   Allergies  Allergen Reactions   Ambien [Zolpidem] Other (See Comments)    Parasomnias, sleep walking   Lisinopril Cough   Tramadol Diarrhea    Medications: Outpatient Encounter Medications as of 10/08/2020  Medication Sig   amitriptyline (ELAVIL) 10 MG tablet Take 1 tablet (10 mg total) by mouth at bedtime. (Patient taking differently: Take 10 mg by mouth at bedtime. Taking PRN)   amLODipine (NORVASC) 10 MG tablet Take 1 tablet (10 mg total) by mouth daily.   aspirin 81 MG chewable tablet Chew 81 mg by mouth at bedtime.   atorvastatin (LIPITOR) 40 MG tablet TAKE 1 TABLET (40 MG TOTAL) BY MOUTH DAILY. NEEDS OFFICE VISIT   carvedilol (COREG) 6.25 MG tablet TAKE 1 TABLET BY MOUTH TWICE A DAY   diclofenac Sodium (VOLTAREN) 1 % GEL Apply 2-4 g topically 4 (four) times daily.   glimepiride (AMARYL) 4 MG tablet TAKE 1 TABLET BY MOUTH TWICE A DAY   glucose blood (ONE TOUCH ULTRA TEST) test strip Check sugar up to 6 times a day.  DM2, insulin treated, uncontrolled.   ibuprofen (ADVIL) 600 MG tablet Take 600 mg by mouth every 8 (eight) hours as needed. (Patient not taking: Reported on 10/04/2020)   insulin NPH Human (NOVOLIN N) 100 UNIT/ML injection Inject 0.2 mLs (20 Units total) into the skin 2 (two) times daily before a meal. (Patient taking differently: Inject 20 Units into the skin 2 (two) times daily before a meal. Taking 20 units in the morning only due to cost 10/04/20)   Insulin Pen Needle 32G X 4 MM MISC Use daily with insulin pen.   Insulin Syringe-Needle U-100 (INSULIN SYRINGE 1CC/31GX5/16") 31G X 5/16" 1 ML MISC Use with NPH insulin twice daily.   isosorbide mononitrate (IMDUR) 30 MG 24 hr tablet Take 1 tablet (30 mg total) by mouth daily.   losartan  (COZAAR) 100 MG tablet TAKE 1 TABLET BY MOUTH EVERY DAY   metFORMIN (GLUCOPHAGE) 1000 MG tablet TAKE 1 TABLET BY MOUTH TWICE A DAY WITH MEALS   nitroGLYCERIN (NITROSTAT) 0.4 MG SL tablet Place 1 tablet (0.4 mg total) under the tongue every 5 (five) minutes as needed for chest pain.   ondansetron (ZOFRAN) 4 MG tablet Take 4 mg by mouth every 8 (eight) hours as needed.   oxyCODONE (OXY IR/ROXICODONE) 5 MG immediate release tablet Take 5 mg by mouth every 6 (six) hours as needed.   pantoprazole (PROTONIX) 40 MG tablet TAKE 1 TABLET BY MOUTH EVERY DAY   tamsulosin (FLOMAX) 0.4 MG CAPS capsule TAKE 1 CAPSULE BY MOUTH EVERY DAY (Patient taking differently: Reports BID)   tiZANidine (ZANAFLEX) 4 MG tablet Take 0.5-1 tablets (2-4 mg total) by mouth every 8 (eight) hours as needed for muscle spasms (Sedation caution). (Patient not taking: Reported on 10/04/2020)   No facility-administered encounter medications on file as of 10/08/2020.    Current Diagnosis: Patient Active Problem List   Diagnosis Date Noted   Nocturia 05/03/2020   Right sided weakness 03/06/2020   Low back pain 01/29/2020   Facial weakness 11/16/2019   Hematuria 03/22/2018   Renal stones 03/22/2018   Vitamin D deficiency, unspecified 10/04/2017   Chronic neck pain (Primary Area of Pain) (B) (L>R) 09/21/2017   Chronic upper extremity pain (Secondary  Area of Pain) (Bilateral) (L>R) 09/21/2017   Chronic left shoulder pain (Tertiary Area of Pain) 09/21/2017   Chronic low back pain (Fourth Area of Pain) (B) (R>L) 09/21/2017   Chronic pain of lower extremity (B) (R>L) 09/21/2017   Other long term (current) drug therapy 09/21/2017   Other specified health status 09/21/2017   Other chronic pain 08/22/2017   Other fatigue 05/10/2017   Radiculopathy 04/07/2017   Angina pectoris ([place]) 10/15/2016   Cervical radiculitis 07/23/2016   Dysphagia 06/12/2016   Sciatica 02/06/2016   Plantar fasciitis 01/16/2016   Right sided sciatica  05/16/2015   Hand pain 04/05/2015   Premature ejaculation 09/17/2014   Heel pain 09/17/2014   NSTEMI (non-ST elevated myocardial infarction) ([place]) 06/13/2014   Atypical chest pain 06/06/2014   Memory change 05/24/2014   Benign paroxysmal positional vertigo 07/13/2013   Orthostasis 07/13/2013   Migraine, unspecified, without mention of intractable migraine without mention of status migrainosus 04/03/2013   Medicare annual wellness visit, initial 02/17/2013   Pain in joint, ankle and foot 10/19/2012   Irritable mood 01/18/2012   Tobacco abuse 12/21/2011   Neck pain 12/03/2010   Hearing loss 12/03/2010   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 Patient has not been checking BG because of affordability of supplies. Contacted patients insurance to inquire which glucose meter is covered by his insurance plan. Per [place] at [employer] would not be any cost to the patient. Coordinated prescriptions to [facility] and patient would like glucometer and test strips delivered if no copay.  Follow-Up:  Medication Cost Review and Pharmacist Review  Debbora Dus, CPP notified  Margaretmary Dys, [place] Assistant [phone]  I have reviewed the care management and care coordination activities outlined in this encounter and I am certifying that I agree with the content of this note. No further action required.  Debbora Dus, PharmD Clinical Pharmacist [place]  [facility] at [facility] [phone]

## 2020-10-08 NOTE — Telephone Encounter (Signed)
Left message to call back  

## 2020-10-09 ENCOUNTER — Other Ambulatory Visit: Payer: Self-pay

## 2020-10-09 DIAGNOSIS — M67912 Unspecified disorder of synovium and tendon, left shoulder: Secondary | ICD-10-CM | POA: Diagnosis not present

## 2020-10-09 MED ORDER — ONETOUCH ULTRASOFT LANCETS MISC: 12 refills | Status: DC

## 2020-10-09 MED ORDER — ONETOUCH VERIO VI STRP: ORAL_STRIP | 12 refills | Status: DC

## 2020-10-09 MED ORDER — ONETOUCH VERIO W/DEVICE KIT: PACK | 0 refills | Status: DC

## 2020-10-10 ENCOUNTER — Other Ambulatory Visit: Payer: Self-pay

## 2020-10-10 MED ORDER — TAMSULOSIN HCL 0.4 MG PO CAPS: 0.8000 mg | ORAL_CAPSULE | Freq: Every day | ORAL | 2 refills | Status: DC

## 2020-10-10 MED ORDER — METFORMIN HCL 1000 MG PO TABS: 1000.0000 mg | ORAL_TABLET | Freq: Two times a day (BID) | ORAL | 1 refills | Status: DC

## 2020-10-10 MED ORDER — ATORVASTATIN CALCIUM 40 MG PO TABS: 40.0000 mg | ORAL_TABLET | Freq: Every day | ORAL | 1 refills | Status: DC

## 2020-10-15 DIAGNOSIS — M19041 Primary osteoarthritis, right hand: Secondary | ICD-10-CM | POA: Diagnosis not present

## 2020-10-16 DIAGNOSIS — M25512 Pain in left shoulder: Secondary | ICD-10-CM | POA: Diagnosis not present

## 2020-10-17 ENCOUNTER — Telehealth: Payer: Self-pay

## 2020-10-17 NOTE — Telephone Encounter (Signed)
Pt called to give bp readings,   10/14/20 99-55 hr 52 10/15/20 132/76 hr 62  10/16/20 114 66 hr 68 10/17/20 141 84 hr 60  Per [place], BP readings are good and he should continue current treatment, also advised pt to make a f/u appt, he said he will after his stress test coming up.

## 2020-10-19 ENCOUNTER — Other Ambulatory Visit: Payer: Self-pay

## 2020-10-19 ENCOUNTER — Ambulatory Visit
Admission: RE | Admit: 2020-10-19 | Discharge: 2020-10-19 | Disposition: A | Discharge status: Home / Self Care | Payer: Medicare HMO | Source: Ambulatory Visit | Attending: Neurology | Admitting: Neurology

## 2020-10-19 DIAGNOSIS — R29898 Other symptoms and signs involving the musculoskeletal system: Secondary | ICD-10-CM

## 2020-10-19 DIAGNOSIS — M4802 Spinal stenosis, cervical region: Secondary | ICD-10-CM | POA: Diagnosis not present

## 2020-10-21 DIAGNOSIS — M67912 Unspecified disorder of synovium and tendon, left shoulder: Secondary | ICD-10-CM | POA: Diagnosis not present

## 2020-10-25 ENCOUNTER — Ambulatory Visit (INDEPENDENT_AMBULATORY_CARE_PROVIDER_SITE_OTHER): Payer: Medicare HMO | Admitting: Family Medicine

## 2020-10-25 ENCOUNTER — Encounter: Payer: Self-pay | Admitting: Family Medicine

## 2020-10-25 ENCOUNTER — Other Ambulatory Visit: Payer: Self-pay

## 2020-10-25 VITALS — BP 102/54 | HR 60 | Temp 97.6°F | Ht 70.0 in | Wt 218.0 lb

## 2020-10-25 DIAGNOSIS — Z794 Long term (current) use of insulin: Secondary | ICD-10-CM | POA: Diagnosis not present

## 2020-10-25 DIAGNOSIS — E1149 Type 2 diabetes mellitus with other diabetic neurological complication: Secondary | ICD-10-CM | POA: Diagnosis not present

## 2020-10-25 DIAGNOSIS — Z9889 Other specified postprocedural states: Secondary | ICD-10-CM | POA: Diagnosis not present

## 2020-10-25 DIAGNOSIS — R079 Chest pain, unspecified: Secondary | ICD-10-CM | POA: Diagnosis not present

## 2020-10-25 LAB — POCT GLYCOSYLATED HEMOGLOBIN (HGB A1C): Hemoglobin A1C: 7.3 % — AB (ref 4.0–5.6)

## 2020-10-25 MED ORDER — AMITRIPTYLINE HCL 10 MG PO TABS: 10.0000 mg | ORAL_TABLET | Freq: Every evening | ORAL | Status: DC | PRN

## 2020-10-25 MED ORDER — INSULIN NPH (HUMAN) (ISOPHANE) 100 UNIT/ML ~~LOC~~ SUSP: 20.0000 [IU] | Freq: Two times a day (BID) | SUBCUTANEOUS | Status: DC

## 2020-10-25 NOTE — Patient Instructions (Addendum)
Let us get the results from the stress test prior to putting in the referral for pulmonary.   We'll call about seeing GI.  Take care.  Glad to see you. I wouldn't change your meds for now.  Plan on recheck in about 3 months.

## 2020-10-25 NOTE — Progress Notes (Signed)
This visit occurred during the SARS-CoV-2 public health emergency.  Safety protocols were in place, including screening questions prior to the visit, additional usage of staff PPE, and extensive cleaning of exam room while observing appropriate contact time as indicated for disinfecting solutions.  Diabetes:  Using medications without difficulties: yes Hypoglycemic episodes:no Hyperglycemic episodes:no Feet problems: no change in sensation.   Blood Sugars averaging: 100-200 eye exam within last year: yes A1c done at OV.  D/w pt.  7.3.  He is trying to get back injections set up through outside clinic.  D/w pt.    BP noted, he isn't lightheaded.  Similar at home.  Usually below 191 systolic.    Discussed GI and pulmonary referral.    He is still having diffuse chest discomfort with prev cards eval, burning in the B lateral pecs. He noted some SOB.  He had seen pulmonary in the past but not recently.  He notes sx are worse with exertion.  He has stress test pending for Monday through cards.  Unclear if starting imdur helped.    No blood in stool but more flatus recently.  He is due for colonoscopy.  Unclear is sx are related to metformin, d/w pt.   Meds, vitals, and allergies reviewed.   ROS: Per HPI unless specifically indicated in ROS section   GEN: nad, alert and oriented HEENT: ncat NECK: supple w/o LA CV: rrr. PULM: ctab, no inc wob, chest wall ttp B, at the lateral pec area, no rash or bruising.   ABD: soft, +bs EXT: no edema SKIN: no acute rash  Diabetic foot exam: Normal inspection No skin breakdown No calluses  Normal DP pulses Normal sensation to light touch and monofilament Nails normal

## 2020-10-25 NOTE — Telephone Encounter (Signed)
Called and discussed with patient. I let him know that he does not owe anything for our office. I verified that insurance was attached for his visits. Pt verbalized understanding.

## 2020-10-27 DIAGNOSIS — Z9889 Other specified postprocedural states: Secondary | ICD-10-CM | POA: Insufficient documentation

## 2020-10-27 NOTE — Assessment & Plan Note (Signed)
Refer to GI 

## 2020-10-27 NOTE — Assessment & Plan Note (Signed)
A1c done at OV.  D/w pt.  7.3. No change in meds at this point. Continue glimepiride insulin and Metformin. Recheck periodically.

## 2020-10-27 NOTE — Assessment & Plan Note (Signed)
He is stress test pending for Monday and I think it makes sense to get through that before we do anything extra. He agrees. We can refer him to pulmonary after the fact if needed.

## 2020-10-28 ENCOUNTER — Ambulatory Visit: Payer: Medicare HMO

## 2020-10-28 ENCOUNTER — Other Ambulatory Visit: Payer: Self-pay

## 2020-10-28 DIAGNOSIS — I25118 Atherosclerotic heart disease of native coronary artery with other forms of angina pectoris: Secondary | ICD-10-CM

## 2020-10-29 ENCOUNTER — Telehealth: Payer: Self-pay

## 2020-10-29 NOTE — Chronic Care Management (AMB) (Addendum)
Chronic Care Management Pharmacy Assistant   Name: Tony Manning  MRN: [id number] DOB: [birth year]  Reason for Encounter: Medication Review- Medication Adherence and Delivery Coordination  Patient Question:  1.  Have you seen any other providers since your last visit? Yes 10/25/20- Dr. [name]- PCP  PCP : Tonia Ghent, MD  Allergies:   Allergies  Allergen Reactions   Ambien [Zolpidem] Other (See Comments)    Parasomnias, sleep walking   Lisinopril Cough   Tramadol Diarrhea    Medications: Outpatient Encounter Medications as of 10/29/2020  Medication Sig   amitriptyline (ELAVIL) 10 MG tablet Take 1 tablet (10 mg total) by mouth at bedtime as needed for sleep.   amLODipine (NORVASC) 10 MG tablet Take 1 tablet (10 mg total) by mouth daily.   aspirin 81 MG chewable tablet Chew 81 mg by mouth at bedtime.   atorvastatin (LIPITOR) 40 MG tablet Take 1 tablet (40 mg total) by mouth daily. NEEDS OFFICE VISIT   Blood Glucose Monitoring Suppl (ONETOUCH VERIO) w/Device KIT Use to check blood sugars TID. Dx: E11.9   carvedilol (COREG) 6.25 MG tablet TAKE 1 TABLET BY MOUTH TWICE A DAY   diclofenac Sodium (VOLTAREN) 1 % GEL Apply 2-4 g topically 4 (four) times daily.   glimepiride (AMARYL) 4 MG tablet TAKE 1 TABLET BY MOUTH TWICE A DAY   glucose blood (ONETOUCH VERIO) test strip Use to check blood sugars TID. Dx: E11.9   ibuprofen (ADVIL) 600 MG tablet Take 600 mg by mouth every 8 (eight) hours as needed.   insulin NPH Human (NOVOLIN N) 100 UNIT/ML injection Inject 0.2 mLs (20 Units total) into the skin 2 (two) times daily before a meal.   Insulin Pen Needle 32G X 4 MM MISC Use daily with insulin pen.   Insulin Syringe-Needle U-100 (INSULIN SYRINGE 1CC/31GX5/16") 31G X 5/16" 1 ML MISC Use with NPH insulin twice daily.   isosorbide mononitrate (IMDUR) 30 MG 24 hr tablet Take 1 tablet (30 mg total) by mouth daily.   Lancets (ONETOUCH ULTRASOFT) lancets Use to check blood sugars TID. Dx:  E11.9   losartan (COZAAR) 100 MG tablet TAKE 1 TABLET BY MOUTH EVERY DAY   metFORMIN (GLUCOPHAGE) 1000 MG tablet Take 1 tablet (1,000 mg total) by mouth 2 (two) times daily with a meal.   nitroGLYCERIN (NITROSTAT) 0.4 MG SL tablet Place 1 tablet (0.4 mg total) under the tongue every 5 (five) minutes as needed for chest pain.   ondansetron (ZOFRAN) 4 MG tablet Take 4 mg by mouth every 8 (eight) hours as needed.   oxyCODONE (OXY IR/ROXICODONE) 5 MG immediate release tablet Take 5 mg by mouth every 6 (six) hours as needed.   pantoprazole (PROTONIX) 40 MG tablet TAKE 1 TABLET BY MOUTH EVERY DAY   tamsulosin (FLOMAX) 0.4 MG CAPS capsule Take 2 capsules (0.8 mg total) by mouth daily.   tiZANidine (ZANAFLEX) 4 MG tablet Take 0.5-1 tablets (2-4 mg total) by mouth every 8 (eight) hours as needed for muscle spasms (Sedation caution).   No facility-administered encounter medications on file as of 10/29/2020.    Current Diagnosis: Patient Active Problem List   Diagnosis Date Noted   H/O colonoscopy 10/27/2020   Nocturia 05/03/2020   Right sided weakness 03/06/2020   Low back pain 01/29/2020   Facial weakness 11/16/2019   Hematuria 03/22/2018   Renal stones 03/22/2018   Vitamin D deficiency, unspecified 10/04/2017   Chronic neck pain (Primary Area of Pain) (B) (L>R) 09/21/2017  Chronic upper extremity pain (Secondary Area of Pain) (Bilateral) (L>R) 09/21/2017   Chronic left shoulder pain (Tertiary Area of Pain) 09/21/2017   Chronic low back pain (Fourth Area of Pain) (B) (R>L) 09/21/2017   Chronic pain of lower extremity (B) (R>L) 09/21/2017   Other long term (current) drug therapy 09/21/2017   Other specified health status 09/21/2017   Other chronic pain 08/22/2017   Other fatigue 05/10/2017   Radiculopathy 04/07/2017   Angina pectoris ([place]) 10/15/2016   Cervical radiculitis 07/23/2016   Dysphagia 06/12/2016   Sciatica 02/06/2016   Plantar fasciitis 01/16/2016   Right sided sciatica  05/16/2015   Hand pain 04/05/2015   Premature ejaculation 09/17/2014   Heel pain 09/17/2014   NSTEMI (non-ST elevated myocardial infarction) ([place]) 06/13/2014   Atypical chest pain 06/06/2014   Memory change 05/24/2014   Benign paroxysmal positional vertigo 07/13/2013   Orthostasis 07/13/2013   Migraine, unspecified, without mention of intractable migraine without mention of status migrainosus 04/03/2013   Medicare annual wellness visit, initial 02/17/2013   Pain in joint, ankle and foot 10/19/2012   Irritable mood 01/18/2012   Tobacco abuse 12/21/2011   Neck pain 12/03/2010   Hearing loss 12/03/2010   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  Recent OV, Consult or Hospital visit: Dr. [name], PCP 10/25/20 No medication changes indicated  BP Readings from Last 3 Encounters:  10/25/20 (!) 102/54  07/23/20 (!) 148/67  07/12/20 136/61    Lab Results  Component Value Date   HGBA1C 7.3 (A) 10/25/2020     Patient obtains medications through Adherence Packaging  30 Days   Last adherence delivery date: N/A - First adherence medication fill  Patient declined the following medications last delivery: N/A  Patient is due for next adherence delivery on: 11/08/20  Contacted patient and reviewed medications and coordinated delivery.  This delivery to include: Adherence Packaging  30 Days  Amlodipine 10 mg- 1 tablet daily (breakfast) Atorvastatin 40 mg- 1 tablet daily (breakfast) Carvedilol 6.25 mg- 2  tablets daily (breakfast, evening meal) Glimepiride 4 mg- 2 tablets daily (breakfast, evening meal) isosorbide mononitrate 30 mg- 1 tablet daily (bedtime) Losartan 100 mg- 1 tablet daily- (bedtime) Metformin 1000 mg- 2 tablets daily (breakfast, evening meal) Pantoprazole 40 mg- 1 tablet daily (breakfast) Tamsulosin 0.4 mg- 2 tablets daily- (breakfast, bedtime)  PRN/VIAL medications: Novolin N 100 unit/ mL- Inject 20 units twice daily  Patient declined the following medications this month: Amitriptyline 10 mg- PRN use Insulin syringe  31G X 5/16" 1 mL- 1 BID- States he has plenty on hand. Has some of his dads that were not used. One Touch Verio meter / test strips- use to check BG up to 6 times a day- States he found a box of 100 test strips in his cabinet. Cyclobenzaprine 5 mg- PRN use Diclofenac Gel 1%- PRN use  Patient does not need refills on any medications.  Confirmed delivery date of 11/08/20, advised patient that pharmacy will contact him the morning of delivery.  Recent blood glucose readings are as follows:     AM fasting   PM  before evening meal 10/21/20:     197  145 10/22/20:     165             117 10/23/20:      88   186 10/24/20      195  291-(had already eaten before checking) 10/26/19:     181  119 10/27/19:     102  109 10/28/19:     135  166 10/28/20:     166   191 10/29/20:     182   Original delivery planned for 11/05/20 but patient states he has 2.5 - 3 weeks of medications on hand. He would like to try to use up some of what he has before delivery. Patient requests delivery date of 11/08/20. Patient also interested in insulin pen instead of the vial he is currently using. Sharyn Lull advised and will look into this. Patient aware she will contact him tomorrow afternoon.   Follow-Up:  Pharmacist Review  Debbora Dus, CPP notified  Margaretmary Dys, [place] Pharmacy Assistant [phone]  Reviewed insurance formulary. Flexpen same price as vials. Discussed  switch to FlexPen with patient. Asked PCP to send new Rx. I also discussed Ozempic with patient and he is open to trying it. Will have Ria Comment review affordability/PAP with patient.  Debbora Dus, PharmD Clinical Pharmacist [place] [facility] at [facility] [phone]

## 2020-10-30 ENCOUNTER — Other Ambulatory Visit: Payer: Self-pay

## 2020-10-30 ENCOUNTER — Other Ambulatory Visit: Payer: Self-pay | Admitting: Student

## 2020-10-30 DIAGNOSIS — I25118 Atherosclerotic heart disease of native coronary artery with other forms of angina pectoris: Secondary | ICD-10-CM

## 2020-10-30 NOTE — Progress Notes (Signed)
Attempted to call pt, no answer. Left vm requesting call back.

## 2020-10-30 NOTE — Progress Notes (Signed)
Pt called back, spoke to him regarding stress test results. Pt voiced understanding.

## 2020-10-30 NOTE — Progress Notes (Signed)
Please inform patient his nuclear stress test is normal.

## 2020-11-04 NOTE — Patient Instructions (Signed)
Dear Tony Manning,  It was a pleasure meeting you during our initial appointment on October 03, 2020. Below is a summary of the goals we discussed and components of chronic care management. Please contact me anytime with questions or concerns.   Visit Information  Goals Addressed            This Visit's Progress   . Pharmacy Care Plan       CARE PLAN ENTRY (see longitudinal plan of care for additional care plan information)  Current Barriers:  . Chronic Disease Management support, education, and care coordination needs related to Hypertension and Diabetes   Hypertension BP Readings from Last 3 Encounters:  07/23/20 (!) 148/67  07/12/20 136/61  07/11/20 140/78   . Pharmacist Clinical Goal(s): o Over the next 90 days, patient will work with PharmD and providers to maintain BP goal <130/80 . Current regimen:   Amlodipine 10 mg - 1 tablet daily (morning)  Carvedilol 6.25 mg - 1 tablet twice daily (morning and bedtime)  Losartan 100 mg - 1 tablet daily (bedtime) . Interventions: o Reviewed home BP monitoring: Checks once daily in the morning.BP is 119/67 mmHg today during visit using home Omron arm cuff o  Yesterday - 127/64 Other this month - 118/73, 120/74  . Patient self care activities - Over the next 90 days, patient will: o Check BP weekly, document, and provide at future appointments o Ensure daily salt intake < 2300 mg/day   Diabetes Lab Results  Component Value Date/Time   HGBA1C 7.6 (A) 07/11/2020 08:55 AM   HGBA1C 7.8 (H) 03/05/2020 02:46 PM   HGBA1C 7.8 (A) 11/16/2019 08:32 AM   HGBA1C 7.9 (H) 05/11/2019 12:14 PM   . Pharmacist Clinical Goal(s): o Over the next 30 days, patient will work with PharmD and providers to achieve A1c goal <7% . Current regimen:   Glimepiride 4 mg - 1 tablet twice daily  Insulin NPH Vial - Inject 20 units into skin twice daily before meals   Metformin 1000 mg - 1 tablet twice daily with  meals . Interventions: o Coordinate affordable test strips o Coordinate switch of insulin to pen injector o Coordinate follow up to discuss PAP for insulin   . Patient self care activities - Over the next 30 days, patient will: o Check blood sugar once daily, document, and provide at future appointments o Contact provider with any episodes of hypoglycemia  Medication management . Pharmacist Clinical Goal(s): o Over the next 3 months, patient will work with PharmD and providers to achieve optimal medication adherence . Current pharmacy: [facility] . Interventions o Comprehensive medication review performed. o Utilize [facility] for medication synchronization, packaging and delivery . Patient self care activities - Over the next 3 months, patient will: o Focus on medication adherence by utilizing adherence packaging  o Take medications as prescribed o Report any questions or concerns to PharmD and/or provider(s)  Initial goal documentation      There are no care plans to display for this patient.   Tony Manning was given information about Chronic Care Management services today including:  1. CCM service includes personalized support from designated clinical staff supervised by his physician, including individualized plan of care and coordination with other care providers 2. 24/7 contact phone numbers for assistance for urgent and routine care needs. 3. Standard insurance, coinsurance, copays and deductibles apply for chronic care management only during months in which we provide at least 20 minutes of these services. Most insurances  cover these services at 100%, however patients may be responsible for any copay, coinsurance and/or deductible if applicable. This service may help you avoid the need for more expensive face-to-face services. 4. Only one practitioner may furnish and bill the service in a calendar month. 5. The patient may stop CCM services at any time (effective at  the end of the month) by phone call to the office staff.  Patient agreed to services and verbal consent obtained.   The patient verbalized understanding of instructions, educational materials, and care plan provided today and declined offer to receive copy of patient instructions, educational materials, and care plan.  The pharmacy team will reach out to the patient again over the next 30 days.    Debbora Dus, PharmD Clinical Pharmacist [place] [facility] at [facility] [phone]

## 2020-11-04 NOTE — Telephone Encounter (Signed)
Ria Comment, can you contact patient and see if he would be interested/eligible for patient assistance for Ozempic. Total household income must be at or below 400% of the federal poverty level (FPL). This is based on the number of persons in household.   Debbora Dus, PharmD Clinical Pharmacist [place] [facility] at [facility] [phone]

## 2020-11-04 NOTE — Telephone Encounter (Signed)
Patient would like to switch his Novolin N vials to the Novolin N FlexPen. Same price per his insurance formulary. Taking 20 units BID.  Debbora Dus, PharmD Clinical Pharmacist [place] [facility] at [facility] [phone]

## 2020-11-05 ENCOUNTER — Other Ambulatory Visit: Payer: Self-pay | Admitting: Family Medicine

## 2020-11-05 NOTE — Chronic Care Management (AMB) (Signed)
Attempted to contact patient to discuss patient assistance. Patient did not answer. Will try again this week.    Follow-Up:  Patient Assistance Coordination and Pharmacist Review  Debbora Dus, CPP notified  Margaretmary Dys, [place] [phone]  Total time spent for month: 4

## 2020-11-05 NOTE — Progress Notes (Signed)
Primary Physician/Referring:  Tonia Ghent, MD  Patient ID: Tony Manning, male    DOB: 03/30/1961, 60 y.o.   MRN: [id number]  Chief Complaint  Patient presents with  . Follow-up  . Hypertension    HPI: Tony Manning  is a 60 y.o. male  with coronary artery disease status post PCI to RCA and LAD, long-standing degenerative disc disease and involving neck and shoulders, hyperlipidemia, OSA on CPAP, uncontrolled DM, hypertension presents for annual visit and follow-up of coronary disease, hypertension and hyperlipidemia.   Patient presents for 2-week follow-up of hypertension and results of cardiac testing.  At last visit increased amlodipine from 5 mg to 10 mg daily.  Patient's Lexiscan nuclear stress test showed normal myocardial perfusion, low risk study.  Patient reports he continues to have intermittent chest pain with atypical features on most.  He follows with Dr. [name] for management of diabetes, A1c in February 2022 was 7.3%.  He brings with him a written log of home blood pressure readings which are under excellent control.  Past Medical History:  Diagnosis Date  . Anginal pain ([place])   . Basal cell carcinoma of face   . Chest pain, unspecified   . Coronary atherosclerosis of native coronary artery    s/p stent x4  . Depression   . Diabetes mellitus without mention of complication   . Diverticulosis   . Dyspnea   . Dysrhythmia   . Erectile dysfunction   . Essential hypertension, benign   . GERD (gastroesophageal reflux disease)   . Heart murmur   . History of kidney stones   . History of nephrolithiasis   . Migraine   . Myocardial infarction ([place])   . Neuromuscular disorder ([place])   . Osteoarthritis   . Pneumonia   . Postsurgical percutaneous transluminal coronary angioplasty status    s/p stent x4  . Sarcoidosis   . Unspecified sleep apnea    uses C-pap    Past Surgical History:  Procedure Laterality Date  . ANTERIOR CERVICAL DECOMP/DISCECTOMY  FUSION N/A 04/07/2017   Procedure: ANTERIOR CERVICAL DECOMPRESSION FUSION, CERVICAL 4-5 WITH INSTRUMENTATION AND ALLOGRAFT;  Surgeon: Phylliss Bob, MD;  Location: [facility];  Service: Orthopedics;  Laterality: N/A;  ANTERIOR CERVICAL DECOMPRESSION FUSION, CERVICAL 4-5 WITH INSTRUMENTATION AND ALLOGRAFT; REQUEST 2.5 HOURS AND FLIP ROOM  . APPENDECTOMY  2005  . CARPAL TUNNEL RELEASE    . CERVICAL FUSION  2009  . CORONARY ANGIOPLASTY WITH STENT PLACEMENT    . CORONARY STENT INTERVENTION N/A 10/16/2016   Procedure: Coronary Stent Intervention;  Surgeon: Adrian Prows, MD;  Location: [facility];  Service: Cardiovascular;  Laterality: N/A;  . CYSTOSCOPY WITH RETROGRADE PYELOGRAM, URETEROSCOPY AND STENT PLACEMENT Right 05/12/2019   Procedure: CYSTOSCOPY WITH RIGHT RETROGRADE PYELOGRAM, URETEROSCOPY HOLMIUM LASER AND STENT PLACEMENT;  Surgeon: Lucas Mallow, MD;  Location: [facility];  Service: Urology;  Laterality: Right;  . ELBOW SURGERY     bilateral  . KNEE SURGERY     Bilateral   . LEFT HEART CATH AND CORONARY ANGIOGRAPHY N/A 10/16/2016   Procedure: Left Heart Cath and Coronary Angiography;  Surgeon: Adrian Prows, MD;  Location: [facility];  Service: Cardiovascular;  Laterality: N/A;  . LEFT HEART CATHETERIZATION WITH CORONARY ANGIOGRAM N/A 06/14/2014   Procedure: LEFT HEART CATHETERIZATION WITH CORONARY ANGIOGRAM;  Surgeon: Laverda Page, MD;  Location: [facility];  Service: Cardiovascular;  Laterality: N/A;  . LUNG BIOPSY     sarcoid  .  ROTATOR CUFF REPAIR     left  . SHOULDER ARTHROSCOPY WITH SUBACROMIAL DECOMPRESSION Left 06/16/2018   Procedure: LEFT SHOULDER ARTHROSCOPY WITH ROTATOR CUFF DEBRIDEMENT AND  SUBACROMIAL DECOMPRESSION;  Surgeon: Tania Ade, MD;  Location: [facility];  Service: Orthopedics;  Laterality: Left;   Social History   Tobacco Use  . Smoking status: Never Smoker  . Smokeless tobacco: Former Systems developer    Types: Chew  . Tobacco comment: trying to quit  Substance  Use Topics  . Alcohol use: No    Alcohol/week: 0.0 standard drinks   Marital Status: Married   ROS  Review of Systems  Constitutional: Negative for malaise/fatigue and weight gain.  Cardiovascular: Positive for chest pain and dyspnea on exertion (mild). Negative for claudication, leg swelling, near-syncope, orthopnea, palpitations, paroxysmal nocturnal dyspnea and syncope.  Hematologic/Lymphatic: Does not bruise/bleed easily.  Musculoskeletal: Positive for arthritis (right knee) and back pain.  Gastrointestinal: Negative for melena.  Neurological: Negative for dizziness and weakness.   Objective  Blood pressure 123/73, pulse 60, temperature 97.9 F (36.6 C), temperature source Temporal, resp. rate 17, height 5' 10" (1.778 m), weight 220 lb 9.6 oz (100.1 kg), SpO2 99 %. Body mass index is 31.65 kg/m.  Vitals with BMI 11/06/2020 10/25/2020 09/20/2020  Height 5' 10" 5' 10" (No Data)  Weight 220 lbs 10 oz 218 lbs (No Data)  BMI 88.89 16.94 -  Systolic 503 888 (No Data)  Diastolic 73 54 (No Data)  Pulse 60 60 (No Data)    Physical Exam Vitals reviewed.  Constitutional:      General: He is not in acute distress.    Appearance: He is well-developed.  HENT:     Head: Normocephalic and atraumatic.  Neck:     Thyroid: No thyromegaly.     Vascular: No JVD.  Cardiovascular:     Rate and Rhythm: Normal rate and regular rhythm.     Pulses: Intact distal pulses.     Heart sounds: Normal heart sounds, S1 normal and S2 normal. No murmur heard. No gallop.   Pulmonary:     Effort: Pulmonary effort is normal. No respiratory distress.     Breath sounds: Normal breath sounds. No wheezing, rhonchi or rales.  Abdominal:     General: Bowel sounds are normal.     Palpations: Abdomen is soft.  Musculoskeletal:        General: Tenderness (along sternum) present. Normal range of motion.     Right lower leg: No edema.     Left lower leg: No edema.  Skin:    General: Skin is warm and dry.   Neurological:     General: No focal deficit present.     Mental Status: He is alert and oriented to person, place, and time.    Laboratory examination:   CMP Latest Ref Rng & Units 03/05/2020 05/29/2019 05/18/2019  Glucose 70 - 99 mg/dL 80 88 203(H)  BUN 6 - 23 mg/dL 18 20 29(H)  Creatinine 0.40 - 1.50 mg/dL 1.43 1.43 1.66(H)  Sodium 135 - 145 mEq/L 141 141 140  Potassium 3.5 - 5.1 mEq/L 4.4 4.7 4.7  Chloride 96 - 112 mEq/L 107 106 108  CO2 19 - 32 mEq/L _0 Calcium 8.4 - 10.5 mg/dL 9.8 10.1 9.6  Total Protein 6.0 - 8.3 g/dL 7.5 - -  Total Bilirubin 0.2 - 1.2 mg/dL 0.5 - -  Alkaline Phos 39 - 117 U/L 67 - -  AST 0 - 37 U/L 20 - -  ALT 0 - 53 U/L 21 - -   CBC Latest Ref Rng & Units 03/05/2020 05/29/2019 05/18/2019  WBC 4.0 - 10.5 K/uL 8.1 8.5 8.4  Hemoglobin 13.0 - 17.0 g/dL 13.6 13.7 12.8(L)  Hematocrit 39.0 - 52.0 % 39.1 40.8 38.3(L)  Platelets 150.0 - 400.0 K/uL 209.0 236.0 286.0   Lipid Panel Recent Labs    03/05/20 1446  CHOL 120  TRIG 89.0  LDLCALC 59  VLDL 17.8  HDL 43.00  CHOLHDL 3    HEMOGLOBIN A1C Lab Results  Component Value Date   HGBA1C 7.3 (A) 10/25/2020   MPG 180.03 05/11/2019   TSH No results for input(s): TSH in the last 8760 hours.   Outpatient Medications Prior to Visit  Medication Sig Dispense Refill  . amitriptyline (ELAVIL) 10 MG tablet Take 1 tablet (10 mg total) by mouth at bedtime as needed for sleep.    [name] Kitchen amLODipine (NORVASC) 10 MG tablet Take 1 tablet (10 mg total) by mouth daily. 90 tablet 3  . aspirin 81 MG chewable tablet Chew 81 mg by mouth at bedtime.    [name] Kitchen atorvastatin (LIPITOR) 40 MG tablet Take 1 tablet (40 mg total) by mouth daily. 90 tablet 3  . Blood Glucose Monitoring Suppl (ONETOUCH VERIO) w/Device KIT Use to check blood sugars TID. Dx: E11.9 1 kit 0  . carvedilol (COREG) 6.25 MG tablet TAKE 1 TABLET BY MOUTH TWICE A DAY 180 tablet 3  . cyclobenzaprine (FLEXERIL) 5 MG tablet Take 5 mg by mouth 2 (two) times daily.    .  diclofenac Sodium (VOLTAREN) 1 % GEL Apply 2-4 g topically 4 (four) times daily. 100 g 5  . glimepiride (AMARYL) 4 MG tablet TAKE 1 TABLET BY MOUTH TWICE A DAY 180 tablet 1  . glucose blood (ONETOUCH VERIO) test strip Use to check blood sugars TID. Dx: E11.9 300 each 12  . ibuprofen (ADVIL) 600 MG tablet Take 600 mg by mouth every 8 (eight) hours as needed.    . insulin NPH Human (NOVOLIN N) 100 UNIT/ML injection Inject 0.2 mLs (20 Units total) into the skin 2 (two) times daily before a meal.    . Insulin Pen Needle 32G X 4 MM MISC Use daily with insulin pen. 100 each 3  . Insulin Syringe-Needle U-100 (INSULIN SYRINGE 1CC/31GX5/16") 31G X 5/16" 1 ML MISC Use with NPH insulin twice daily. 100 each 3  . isosorbide mononitrate (IMDUR) 30 MG 24 hr tablet Take 1 tablet (30 mg total) by mouth daily. 90 tablet 3  . Lancets (ONETOUCH ULTRASOFT) lancets Use to check blood sugars TID. Dx: E11.9 300 each 12  . losartan (COZAAR) 100 MG tablet TAKE 1 TABLET BY MOUTH EVERY DAY 90 tablet 2  . metFORMIN (GLUCOPHAGE) 1000 MG tablet Take 1 tablet (1,000 mg total) by mouth 2 (two) times daily with a meal. 180 tablet 1  . nitroGLYCERIN (NITROSTAT) 0.4 MG SL tablet Place 1 tablet (0.4 mg total) under the tongue every 5 (five) minutes as needed for chest pain. 25 tablet 3  . ondansetron (ZOFRAN) 4 MG tablet Take 4 mg by mouth every 8 (eight) hours as needed.    [name] Kitchen oxyCODONE (OXY IR/ROXICODONE) 5 MG immediate release tablet Take 5 mg by mouth every 6 (six) hours as needed.    . pantoprazole (PROTONIX) 40 MG tablet TAKE 1 TABLET BY MOUTH EVERY DAY 30 tablet 3  . tamsulosin (FLOMAX) 0.4 MG CAPS capsule Take 2 capsules (0.8 mg total) by mouth daily. 90 capsule  2  . tiZANidine (ZANAFLEX) 4 MG tablet Take 0.5-1 tablets (2-4 mg total) by mouth every 8 (eight) hours as needed for muscle spasms (Sedation caution). 30 tablet 0   No facility-administered medications prior to visit.   Allergies  Allergen Reactions  . Ambien  [Zolpidem] Other (See Comments)    Parasomnias, sleep walking  . Lisinopril Cough  . Tramadol Diarrhea    Cardiac Studies:   PCV MYOCARDIAL PERFUSION WITH LEXISCAN 10/30/2020 Lexiscan nuclear stress test performed using 1-day protocol. SPECT imaging shows diaphragmatic tissue attenuation, but otherwise normal myocardial perfusion. Stress LVEF 63%. Low risk study.  Lexiscan myoview stress test 01/07/2018: 1. Lexiscan stress test performed. Exercise capacity was not assessed. Stress symptoms included dyspnea, flushing. Normal blood pressure. The resting electrocardiogram demonstrated normal sinus rhythm, normal resting conduction, no resting arrhythmias and normal rest repolarization. Stress EKG is non diagnostic for ischemia as it is a pharmacologic stress. 2. The overall quality of the study is excellent. There is no evidence of abnormal lung activity. Stress and rest SPECT images demonstrate homogeneous tracer distribution throughout the myocardium. Gated SPECT imaging reveals normal myocardial thickening and wall motion. The left ventricular ejection fraction was normal (60%).  3. Low risk study.  Echocardiogram 02/01/2018: Left ventricle cavity is normal in size. Normal global wall motion. Indeterminate diastolic filling pattern, indeterminate LAP. Calculated EF 55%. No significant valvular abnormality. Inadequate tricuspid regurgitation jet to estimate pulmonary artery pressure. Normal right atrial pressure.  Coronary angiogram 10/16/2016: 3.0 x 38 mm onyx DES Prox RCA. 06/14/2014: Thrombectomy of the mid RCA & Stenting with 3.0 x 18 mm Xience Alpine DES. Stenting mid LAD 3.0 x 18 mm Xience Alpine DES. Patent Mid LAD 2.75x12 mm Taxus, 2.5x12 Distal RCA stent x 2 from 2004 and 2007. Normal LVEF. Diffuse CAD.   EKG   EKG 07/23/2020: Sinus rhythm at a rate of 64 bpm, left atrial enlargement.  Normal axis.  No evidence of ischemia.  Compared to EKG 07/12/2020, no poor R wave progression  noted.  Assessment   Essential hypertension, benign  Coronary artery disease of native artery of native heart with stable angina pectoris ([place])  Mixed hyperlipidemia  Non-cardiac chest pain  There are no discontinued medications.  No orders of the defined types were placed in this encounter.   Recommendations:   Tony Manning  is a 60 y.o. male  with coronary artery disease status post PCI to RCA and LAD, long-standing degenerative disc disease and involving neck and shoulders, hyperlipidemia, OSA on CPAP, uncontrolled DM, hypertension presents for annual visit and follow-up of coronary disease, hypertension and hyperlipidemia.  Patient presents for 6-week follow-up of hypertension and results of stress testing.  Stress test revealed normal myocardial cardial perfusion and normal stress LVEF, low risk study.  In view of low risk nuclear stress test, suspect noncardiac etiology of patient's chest pain.  Physical exam suggestive of musculoskeletal chest pain.  Patient is working with his PCP to proceed with both GI and pulmonary evaluation of symptoms.  Cardiac work-up has been unyielding for etiology of patient's chest pain.  Patient's blood pressure is not well controlled.  Last lipid panel available for review showed lipids controlled.  Recommend aggressive risk factor management and primary prevention.  Will not make changes to patient's medications at this time.  Reviewed and discussed with patient results of nuclear stress test, details above.  Discussed with patient regarding the importance of risk factor modification including diet and lifestyle changes as well as weight loss,  patient verbalized understanding and agreement.  Patient is otherwise stable from a cardiovascular standpoint.  Follow-up in 6 months, sooner if needed, for hypertension, hyperlipidemia, CAD.   Alethia Berthold, PA-C 11/06/2020, 9:53 AM Office: [phone]

## 2020-11-06 ENCOUNTER — Other Ambulatory Visit: Payer: Self-pay

## 2020-11-06 ENCOUNTER — Encounter: Payer: Self-pay | Admitting: Student

## 2020-11-06 ENCOUNTER — Telehealth: Payer: Self-pay | Admitting: Family Medicine

## 2020-11-06 ENCOUNTER — Ambulatory Visit: Payer: Medicare HMO | Admitting: Student

## 2020-11-06 VITALS — BP 123/73 | HR 60 | Temp 97.9°F | Resp 17 | Ht 70.0 in | Wt 220.6 lb

## 2020-11-06 DIAGNOSIS — I10 Essential (primary) hypertension: Secondary | ICD-10-CM

## 2020-11-06 DIAGNOSIS — E782 Mixed hyperlipidemia: Secondary | ICD-10-CM | POA: Diagnosis not present

## 2020-11-06 DIAGNOSIS — I25118 Atherosclerotic heart disease of native coronary artery with other forms of angina pectoris: Secondary | ICD-10-CM

## 2020-11-06 DIAGNOSIS — R0789 Other chest pain: Secondary | ICD-10-CM | POA: Diagnosis not present

## 2020-11-06 DIAGNOSIS — R0602 Shortness of breath: Secondary | ICD-10-CM

## 2020-11-06 MED ORDER — NOVOLIN N FLEXPEN 100 UNIT/ML ~~LOC~~ SUPN: 20.0000 [IU] | PEN_INJECTOR | Freq: Two times a day (BID) | SUBCUTANEOUS | 11 refills | Status: DC

## 2020-11-06 NOTE — Telephone Encounter (Signed)
novolin pen rx sent.  Thanks.

## 2020-11-06 NOTE — Addendum Note (Signed)
Addended by: Tonia Ghent on: 11/06/2020 02:59 PM   Modules accepted: Orders

## 2020-11-06 NOTE — Telephone Encounter (Signed)
Please let patient know I saw his cardiology notes and I put in the referral for pulmonary.  We will work on getting this set up.  Thanks.

## 2020-11-07 ENCOUNTER — Encounter: Payer: Self-pay | Admitting: Internal Medicine

## 2020-11-07 NOTE — Telephone Encounter (Signed)
Notified patient that referral was done for pulmonary

## 2020-11-11 ENCOUNTER — Telehealth: Payer: Self-pay | Admitting: Adult Health

## 2020-11-11 NOTE — Telephone Encounter (Signed)
Lm requesting that patient bring SD card to ov on 11/12/2020

## 2020-11-12 ENCOUNTER — Other Ambulatory Visit
Admission: RE | Admit: 2020-11-12 | Discharge: 2020-11-12 | Disposition: A | Discharge status: Home / Self Care | Payer: Medicare HMO | Source: Ambulatory Visit | Attending: Adult Health | Admitting: Adult Health

## 2020-11-12 ENCOUNTER — Encounter: Payer: Self-pay | Admitting: Adult Health

## 2020-11-12 ENCOUNTER — Other Ambulatory Visit: Payer: Self-pay

## 2020-11-12 ENCOUNTER — Ambulatory Visit (INDEPENDENT_AMBULATORY_CARE_PROVIDER_SITE_OTHER): Payer: Medicare HMO | Admitting: Adult Health

## 2020-11-12 VITALS — BP 126/72 | HR 61 | Temp 97.3°F | Ht 70.0 in | Wt 222.0 lb

## 2020-11-12 DIAGNOSIS — R0600 Dyspnea, unspecified: Secondary | ICD-10-CM | POA: Diagnosis not present

## 2020-11-12 DIAGNOSIS — D869 Sarcoidosis, unspecified: Secondary | ICD-10-CM | POA: Diagnosis not present

## 2020-11-12 DIAGNOSIS — G4730 Sleep apnea, unspecified: Secondary | ICD-10-CM

## 2020-11-12 DIAGNOSIS — R0602 Shortness of breath: Secondary | ICD-10-CM | POA: Diagnosis not present

## 2020-11-12 DIAGNOSIS — G4733 Obstructive sleep apnea (adult) (pediatric): Secondary | ICD-10-CM | POA: Diagnosis not present

## 2020-11-12 DIAGNOSIS — R079 Chest pain, unspecified: Secondary | ICD-10-CM

## 2020-11-12 LAB — CBC WITH DIFFERENTIAL/PLATELET
Abs Immature Granulocytes: 0.02 10*3/uL (ref 0.00–0.07)
Basophils Absolute: 0 10*3/uL (ref 0.0–0.1)
Basophils Relative: 1 %
Eosinophils Absolute: 0.2 10*3/uL (ref 0.0–0.5)
Eosinophils Relative: 3 %
HCT: 37.9 % — ABNORMAL LOW (ref 39.0–52.0)
Hemoglobin: 13.3 g/dL (ref 13.0–17.0)
Immature Granulocytes: 0 %
Lymphocytes Relative: 22 %
Lymphs Abs: 1.5 10*3/uL (ref 0.7–4.0)
MCH: 31.1 pg (ref 26.0–34.0)
MCHC: 35.1 g/dL (ref 30.0–36.0)
MCV: 88.6 fL (ref 80.0–100.0)
Monocytes Absolute: 0.5 10*3/uL (ref 0.1–1.0)
Monocytes Relative: 8 %
Neutro Abs: 4.6 10*3/uL (ref 1.7–7.7)
Neutrophils Relative %: 66 %
Platelets: 215 10*3/uL (ref 150–400)
RBC: 4.28 MIL/uL (ref 4.22–5.81)
RDW: 13 % (ref 11.5–15.5)
WBC: 6.8 10*3/uL (ref 4.0–10.5)
nRBC: 0 % (ref 0.0–0.2)

## 2020-11-12 LAB — BASIC METABOLIC PANEL
Anion gap: 10 (ref 5–15)
BUN: 25 mg/dL — ABNORMAL HIGH (ref 6–20)
CO2: 21 mmol/L — ABNORMAL LOW (ref 22–32)
Calcium: 10 mg/dL (ref 8.9–10.3)
Chloride: 109 mmol/L (ref 98–111)
Creatinine, Ser: 1.47 mg/dL — ABNORMAL HIGH (ref 0.61–1.24)
GFR, Estimated: 54 mL/min — ABNORMAL LOW (ref >60)
Glucose, Bld: 168 mg/dL — ABNORMAL HIGH (ref 70–99)
Potassium: 4.7 mmol/L (ref 3.5–5.1)
Sodium: 140 mmol/L (ref 135–145)

## 2020-11-12 MED ORDER — FAMOTIDINE 20 MG PO TABS: 20.0000 mg | ORAL_TABLET | Freq: Every day | ORAL | 2 refills | Status: DC

## 2020-11-12 NOTE — Progress Notes (Signed)
_0  ID: Tony Manning, male    DOB: [birth year], 60 y.o.   MRN: [id number]  Chief Complaint  Patient presents with  . sleep consult    Referring provider: Tonia Ghent, MD  HPI: 60 year old male never smoker seen for sleep consult November 12, 2020 for sleep apnea and Sarciod and Dyspnea  Medical history significant for coronary artery disease status post PCI to RCA and LAD, hypertension, diabetes, hyperlipidemia, degenerative disc disease  TEST/EVENTS :  Chest x-ray November 2021 lungs clear  2D echo May 2019 EF 55%  October 30, 2020 nuclear stress test normal  11/12/2020 Pulmonary and Sleep Consult  Patient presents for a pulmonary and sleep consult.  Patient carries a diagnosis of sarcoidosis.  Patient says he was diagnosed many years ago with sarcoidosis.  He underwent a lung biopsy greater than 20 years ago.  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  Has had no treatment for sarcoid in many years.  He denies any other involvement such as eye liver or cardiac or skin involvement that he knows off.  Complains of chronic shortness of breath and tightness has been present for many years.  Patient complains that he has shortness of breath that seems to be present most of the time.  Has intermittent tightness along the chest.  Denies any significant cough or wheezing.  Patient is followed by cardiology and has had extensive cardiac work-ups.  Patient denies any orthopnea or increased leg swelling.  He has never smoked.  Denies any alcohol or drug use.  Has adult children and is married.  Patient is disabled and is an active.  Says he has chronic pain and is unable to do much activities.  Chest x-ray November 2021 showed clear lungs.  Patient denies any increased heartburn is on Protonix daily.  But feels that he gets tightness up under his rib cage at times.  And has a burning sensation in the epigastric region  intermittently   Patient would like to establish for sleep apnea.  Diagnosed with sleep apnea almost 20 years ago.  Has been on CPAP for very long time.  He would like to establish here for his sleep apnea so that he can get his CPAP supplies.  Says he is doing well on CPAP.  He wears his CPAP every single night gets in about 7 to 8 hours of sleep.  Says he cannot sleep without it.  He does feel that the CPAP really helps him.  But he has many medical problems and has fatigue throughout the day.  He was previously followed with Dr. Chancy Milroy but has not seen him for a long time.  Patient needs new CPAP supplies.  Typically goes to bed around 11-11 30 gets up multiple times during the night due to frequent urination and back and joint pains.  Gets up at different times in the morning.  Patient is disabled due to his diabetes and heart problems.  Typically drinks about 2 sodas a day.  Uses a nasal mask.  CPAP download shows excellent compliance with 100% usage.  Daily average usage over the last 30 days was 8 hours each night.  Patient is on CPAP 8 cm H2O.  AHI 0.8.  And minimum leaks. Currently on CPAP   Allergies  Allergen Reactions  . Ambien [Zolpidem] Other (See Comments)    Parasomnias, sleep walking  . Lisinopril Cough  . Tramadol Diarrhea  Immunization History  Administered Date(s) Administered  . Influenza Inj Mdck Quad Pf 06/02/2018  . Influenza Whole 09/17/2010  . Influenza,inj,Quad PF,6+ Mos 06/08/2013, 05/22/2014, 05/12/2016, 05/08/2019, 05/27/2020  . Influenza-Unspecified 06/08/2013, 05/22/2014, 06/08/2015, 05/12/2016, 05/21/2017, 06/02/2018, 05/09/2019  . PFIZER(Purple Top)SARS-COV-2 Vaccination 11/27/2019, 12/25/2019, 08/07/2020  . Pneumococcal Polysaccharide-23 09/08/2007, 02/16/2013  . Td 09/07/2009    Past Medical History:  Diagnosis Date  . Anginal pain ([place])   . Basal cell carcinoma of face   . Chest pain, unspecified   . Coronary atherosclerosis of native coronary  artery    s/p stent x4  . Depression   . Diabetes mellitus without mention of complication   . Diverticulosis   . Dyspnea   . Dysrhythmia   . Erectile dysfunction   . Essential hypertension, benign   . GERD (gastroesophageal reflux disease)   . Heart murmur   . History of kidney stones   . History of nephrolithiasis   . Migraine   . Myocardial infarction ([place])   . Neuromuscular disorder ([place])   . Osteoarthritis   . Pneumonia   . Postsurgical percutaneous transluminal coronary angioplasty status    s/p stent x4  . Sarcoidosis   . Unspecified sleep apnea    uses C-pap    Tobacco History: Social History   Tobacco Use  Smoking Status Never Smoker  Smokeless Tobacco Former Systems developer  . Types: Chew   Counseling given: Not Answered  Social history patient is married.  Has adult children.  Denies any alcohol or drug use.  Never smoker.  Patient is disabled  Outpatient Medications Prior to Visit  Medication Sig Dispense Refill  . amitriptyline (ELAVIL) 10 MG tablet Take 1 tablet (10 mg total) by mouth at bedtime as needed for sleep.    [name] Kitchen amLODipine (NORVASC) 10 MG tablet Take 1 tablet (10 mg total) by mouth daily. 90 tablet 3  . aspirin 81 MG chewable tablet Chew 81 mg by mouth at bedtime.    [name] Kitchen atorvastatin (LIPITOR) 40 MG tablet Take 1 tablet (40 mg total) by mouth daily. 90 tablet 3  . Blood Glucose Monitoring Suppl (ONETOUCH VERIO) w/Device KIT Use to check blood sugars TID. Dx: E11.9 1 kit 0  . carvedilol (COREG) 6.25 MG tablet TAKE 1 TABLET BY MOUTH TWICE A DAY 180 tablet 3  . cyclobenzaprine (FLEXERIL) 5 MG tablet Take 5 mg by mouth 2 (two) times daily.    . diclofenac Sodium (VOLTAREN) 1 % GEL Apply 2-4 g topically 4 (four) times daily. 100 g 5  . glimepiride (AMARYL) 4 MG tablet TAKE 1 TABLET BY MOUTH TWICE A DAY 180 tablet 1  . glucose blood (ONETOUCH VERIO) test strip Use to check blood sugars TID. Dx: E11.9 300 each 12  . ibuprofen (ADVIL) 600 MG tablet Take 600 mg by  mouth every 8 (eight) hours as needed.    . Insulin NPH, Human,, Isophane, (NOVOLIN N FLEXPEN) 100 UNIT/ML Kiwkpen Inject 20 Units into the skin in the morning and at bedtime. 15 mL 11  . Insulin Pen Needle 32G X 4 MM MISC Use daily with insulin pen. 100 each 3  . Insulin Syringe-Needle U-100 (INSULIN SYRINGE 1CC/31GX5/16") 31G X 5/16" 1 ML MISC Use with NPH insulin twice daily. 100 each 3  . isosorbide mononitrate (IMDUR) 30 MG 24 hr tablet Take 1 tablet (30 mg total) by mouth daily. 90 tablet 3  . Lancets (ONETOUCH ULTRASOFT) lancets Use to check blood sugars TID. Dx: E11.9 300 each 12  .  losartan (COZAAR) 100 MG tablet TAKE 1 TABLET BY MOUTH EVERY DAY 90 tablet 2  . metFORMIN (GLUCOPHAGE) 1000 MG tablet Take 1 tablet (1,000 mg total) by mouth 2 (two) times daily with a meal. 180 tablet 1  . nitroGLYCERIN (NITROSTAT) 0.4 MG SL tablet Place 1 tablet (0.4 mg total) under the tongue every 5 (five) minutes as needed for chest pain. 25 tablet 3  . ondansetron (ZOFRAN) 4 MG tablet Take 4 mg by mouth every 8 (eight) hours as needed.    [name] Kitchen oxyCODONE (OXY IR/ROXICODONE) 5 MG immediate release tablet Take 5 mg by mouth every 6 (six) hours as needed.    . pantoprazole (PROTONIX) 40 MG tablet TAKE 1 TABLET BY MOUTH EVERY DAY 30 tablet 3  . tamsulosin (FLOMAX) 0.4 MG CAPS capsule Take 2 capsules (0.8 mg total) by mouth daily. 90 capsule 2  . tiZANidine (ZANAFLEX) 4 MG tablet Take 0.5-1 tablets (2-4 mg total) by mouth every 8 (eight) hours as needed for muscle spasms (Sedation caution). 30 tablet 0   No facility-administered medications prior to visit.     Review of Systems:   Constitutional:   No  weight loss, night sweats,  Fevers, chills,  +fatigue, or  lassitude.  HEENT:   No headaches,  Difficulty swallowing,  Tooth/dental problems, or  Sore throat,                No sneezing, itching, ear ache, nasal congestion, post nasal drip,   CV:  No chest pain,  Orthopnea, PND, swelling in lower  extremities, anasarca, dizziness, palpitations, syncope.   GI  No heartburn, indigestion, abdominal pain, nausea, vomiting, diarrhea, change in bowel habits, loss of appetite, bloody stools.   Resp:  No chest wall deformity  Skin: no rash or lesions.  GU: no dysuria, change in color of urine, no urgency or frequency.  No flank pain, no hematuria   MS: Chronic back and joint pain     Physical Exam  BP 126/72 (BP Location: Left Arm, Cuff Size: Normal)   Pulse 61   Temp (!) 97.3 F (36.3 C) (Temporal)   Ht _0  (1.778 m)   Wt 222 lb (100.7 kg)   SpO2 99%   BMI 31.85 kg/m   GEN: A/Ox3; pleasant , NAD, well nourished    HEENT:  [place]/AT,  NOSE-clear, THROAT-clear, no lesions, no postnasal drip or exudate noted.  Class II-III NP airway  NECK:  Supple w/ fair ROM; no JVD; normal carotid impulses w/o bruits; no thyromegaly or nodules palpated; no lymphadenopathy.    RESP  Clear  P & A; w/o, wheezes/ rales/ or rhonchi. no accessory muscle use, no dullness to percussion  CARD:  RRR, no m/r/g, 1+ peripheral edema, pulses intact, no cyanosis or clubbing.  GI:   Soft & nt; nml bowel sounds; no organomegaly or masses detected.   Musco: Warm bil, no deformities or joint swelling noted.   Neuro: alert, no focal deficits noted.    Skin: Warm, no lesions or rashes     BNP No results found for: BNP  ProBNP    Component Value Date/Time   PROBNP <30.0 09/14/2007 1644    Imaging:     No flowsheet data found.  No results found for: NITRICOXIDE      Assessment & Plan:   Sleep apnea Obstructive sleep apnea diagnosed greater than 20 years ago patient has excellent control and compliance on nocturnal CPAP.  Patient is continue on CPAP at bedtime.  Order for CPAP supplies sent to his local DME company per request. Patient is advised on sedating medications does have chronic pain and uses pain medications. Sleep records requested.  Patient is advised on healthy weight  loss.  Plan  Patient Instructions  Continue on CPAP At bedtime   Keep up good work  Order to DME for supplies .  Do not drive if sleepy  Work on healthy weight .    Activity as tolerated.  Check PFT on return visit .  Labs today .  Continue Protonix 25m daily  GERD diet  Add Pepcid 215mAt bedtime    Follow up with Dr. OlAnder Sladen [place] in 4-6 weeks with PFTs and As needed         Dyspnea Chronic dyspnea questionable etiology.  Patient has multiple medical problems.  With a previous history of sarcoidosis.  Recent chest x-ray showed clear lungs. We will have patient return for pulmonary function testing.   Labs today with CBC and be met  Plan  Patient Instructions  Continue on CPAP At bedtime   Keep up good work  Order to DME for supplies .  Do not drive if sleepy  Work on healthy weight .    Activity as tolerated.  Check PFT on return visit .  Labs today .  Continue Protonix 4074maily  GERD diet  Add Pepcid 61m87m bedtime    Follow up with Dr. OlalAnder SladeGreeChickenice in 4-6 weeks with PFTs and As needed        Sarcoidosis Reported history of pulmonary sarcoidosis with previous lung biopsy.  Unable to locate records.  Does not appear to have any active symptoms and recent chest x-ray with no sarcoidosis changes.  LFTs in 2021 were normal will check pulmonary function testing on return visit.  CHEST PAIN UNSPECIFIED Atypical chest tightness.  Recent negative cardiac work-up.  Possible underlying GERD.  We will add Pepcid at bedtime.  GERD diet. Continue follow-up with cardiology Plan  Patient Instructions  Continue on CPAP At bedtime   Keep up good work  Order to DME for supplies .  Do not drive if sleepy  Work on healthy weight .    Activity as tolerated.  Check PFT on return visit .  Labs today .  Continue Protonix 40mg48mly  GERD diet  Add Pepcid 61mg 46medtime    Follow up with Dr. [place]alerAnder [place] in 4-6  weeks with PFTs and As needed           Tammy Rexene Edison/04/2021

## 2020-11-12 NOTE — Assessment & Plan Note (Signed)
Chronic dyspnea questionable etiology.  Patient has multiple medical problems.  With a previous history of sarcoidosis.  Recent chest x-ray showed clear lungs. We will have patient return for pulmonary function testing.   Labs today with CBC and be met  Plan  Patient Instructions  Continue on CPAP At bedtime   Keep up good work  Order to DME for supplies .  Do not drive if sleepy  Work on healthy weight .    Activity as tolerated.  Check PFT on return visit .  Labs today .  Continue Protonix $RemoveBeforeDEI'40mg'[id number]$  daily  GERD diet  Add Pepcid $RemoveBe'20mg'QffslFyKj$  At bedtime    Follow up with Dr. Ander Slade in [place] office in 4-6 weeks with PFTs and As needed

## 2020-11-12 NOTE — Assessment & Plan Note (Signed)
Obstructive sleep apnea diagnosed greater than 20 years ago patient has excellent control and compliance on nocturnal CPAP.  Patient is continue on CPAP at bedtime.  Order for CPAP supplies sent to his local DME company per request. Patient is advised on sedating medications does have chronic pain and uses pain medications. Sleep records requested.  Patient is advised on healthy weight loss.  Plan  Patient Instructions  Continue on CPAP At bedtime   Keep up good work  Order to DME for supplies .  Do not drive if sleepy  Work on healthy weight .    Activity as tolerated.  Check PFT on return visit .  Labs today .  Continue Protonix 40mg  daily  GERD diet  Add Pepcid 20mg  At bedtime    Follow up with Dr. Ander Slade in [place] office in 4-6 weeks with PFTs and As needed

## 2020-11-12 NOTE — Assessment & Plan Note (Addendum)
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  Unable to locate records.  Does not appear to have any active symptoms and recent chest x-ray with no sarcoidosis changes.  LFTs in 2021 were normal will check pulmonary function testing on return visit.

## 2020-11-12 NOTE — Patient Instructions (Addendum)
Continue on CPAP At bedtime   Keep up good work  Order to DME for supplies .  Do not drive if sleepy  Work on healthy weight .    Activity as tolerated.  Check PFT on return visit .  Labs today .  Continue Protonix 40mg  daily  GERD diet  Add Pepcid 20mg  At bedtime    Follow up with Dr. Ander Slade in [place] office in 4-6 weeks with PFTs and As needed

## 2020-11-12 NOTE — Assessment & Plan Note (Signed)
Atypical chest tightness.  Recent negative cardiac work-up.  Possible underlying GERD.  We will add Pepcid at bedtime.  GERD diet. Continue follow-up with cardiology Plan  Patient Instructions  Continue on CPAP At bedtime   Keep up good work  Order to DME for supplies .  Do not drive if sleepy  Work on healthy weight .    Activity as tolerated.  Check PFT on return visit .  Labs today .  Continue Protonix 40mg  daily  GERD diet  Add Pepcid 20mg  At bedtime    Follow up with Dr. Ander Slade in [place] office in 4-6 weeks with PFTs and As needed

## 2020-11-13 ENCOUNTER — Telehealth: Payer: Self-pay | Admitting: *Deleted

## 2020-11-13 NOTE — Telephone Encounter (Signed)
Patient left a voicemail stating that he got a call from [facility] telling him that he has a kidney disease. Patient stated that he figures that Dr. [name] will get the results also. Patient wants to know what the deal is and if he needs to come in and see Dr. [name] about this.

## 2020-11-13 NOTE — Telephone Encounter (Signed)
plz notify he does have a degree of chronic kidney impairment over the last few years. Likely from hypertension and diabetes history.  Will leave f/u plan up to Dr [name] when he returns next week.  In interim, recommend staying well hydrated with plenty of water, and ongoing efforts towards good blood pressure and sugar control to keep kidneys stable.

## 2020-11-13 NOTE — Progress Notes (Signed)
Called and spoke with patient, advised of results/recommendations per Tammy Parrett NP.  He verbalized understanding.  Nothing further needed.

## 2020-11-15 NOTE — Telephone Encounter (Signed)
Spoke with patient about below message from Dr. Danise Mina. Advised patient I will be forwarding message back to Dr. [name] as well for f/u plans and will give him a call back next week. Patient okay with this and will await our call back.

## 2020-11-17 NOTE — Telephone Encounter (Addendum)
I would be surprised if any patient with a history of diabetes had normal kidney function at age 60.  He has mildly impaired kidney function and I do not think this is nearly as a big of a deal as his other issues.  He is already on losartan which should help protect his kidney function.  Managing his diabetes and blood pressure will help protect his kidney function in the long run.  We can check his creatinine episodically but his kidney function now is better than it had been on some of the previous checks.  I think it makes sense to continue with routine diabetic follow-up as planned.

## 2020-11-18 NOTE — Telephone Encounter (Signed)
Spoke with patient about below message from Dr. [name]. Patient verbalized understanding and will call back to make appt for may when gets closer to time.

## 2020-11-21 ENCOUNTER — Telehealth: Payer: Self-pay

## 2020-11-21 DIAGNOSIS — M25561 Pain in right knee: Secondary | ICD-10-CM | POA: Diagnosis not present

## 2020-11-21 NOTE — Telephone Encounter (Addendum)
Patient assistance form for Ozempic 0.25 mg weekly completed, signed by patient and provider and sent to manufacturer.  Tony Manning will follow up on approval status next month.

## 2020-11-26 ENCOUNTER — Telehealth: Payer: Self-pay

## 2020-11-26 NOTE — Chronic Care Management (AMB) (Addendum)
Chronic Care Management Pharmacy Assistant   Name: Tony Manning  MRN: [id number] DOB: 09-28-1960  Reason for Encounter: Medication Adherence and Delivery Coordination   Recent office visits:  No recent PCP visits  Recent consult visits:  11/12/20- Rexene Edison, NP- Pulmonology- Started famotidine 20 mg 11/06/20- Lawerance Cruel, PA-C, Cardiology  Hospital visits:  None in previous 6 months  Medications: Outpatient Encounter Medications as of 11/26/2020  Medication Sig   amitriptyline (ELAVIL) 10 MG tablet Take 1 tablet (10 mg total) by mouth at bedtime as needed for sleep.   amLODipine (NORVASC) 10 MG tablet Take 1 tablet (10 mg total) by mouth daily.   aspirin 81 MG chewable tablet Chew 81 mg by mouth at bedtime.   atorvastatin (LIPITOR) 40 MG tablet Take 1 tablet (40 mg total) by mouth daily.   Blood Glucose Monitoring Suppl (ONETOUCH VERIO) w/Device KIT Use to check blood sugars TID. Dx: E11.9   carvedilol (COREG) 6.25 MG tablet TAKE 1 TABLET BY MOUTH TWICE A DAY   cyclobenzaprine (FLEXERIL) 5 MG tablet Take 5 mg by mouth 2 (two) times daily.   diclofenac Sodium (VOLTAREN) 1 % GEL Apply 2-4 g topically 4 (four) times daily.   famotidine (PEPCID) 20 MG tablet Take 1 tablet (20 mg total) by mouth at bedtime.   glimepiride (AMARYL) 4 MG tablet TAKE 1 TABLET BY MOUTH TWICE A DAY   glucose blood (ONETOUCH VERIO) test strip Use to check blood sugars TID. Dx: E11.9   ibuprofen (ADVIL) 600 MG tablet Take 600 mg by mouth every 8 (eight) hours as needed.   Insulin NPH, Human,, Isophane, (NOVOLIN N FLEXPEN) 100 UNIT/ML Kiwkpen Inject 20 Units into the skin in the morning and at bedtime.   Insulin Pen Needle 32G X 4 MM MISC Use daily with insulin pen.   Insulin Syringe-Needle U-100 (INSULIN SYRINGE 1CC/31GX5/16") 31G X 5/16" 1 ML MISC Use with NPH insulin twice daily.   isosorbide mononitrate (IMDUR) 30 MG 24 hr tablet Take 1 tablet (30 mg total) by mouth daily.   Lancets  (ONETOUCH ULTRASOFT) lancets Use to check blood sugars TID. Dx: E11.9   losartan (COZAAR) 100 MG tablet TAKE 1 TABLET BY MOUTH EVERY DAY   metFORMIN (GLUCOPHAGE) 1000 MG tablet Take 1 tablet (1,000 mg total) by mouth 2 (two) times daily with a meal.   nitroGLYCERIN (NITROSTAT) 0.4 MG SL tablet Place 1 tablet (0.4 mg total) under the tongue every 5 (five) minutes as needed for chest pain.   ondansetron (ZOFRAN) 4 MG tablet Take 4 mg by mouth every 8 (eight) hours as needed.   oxyCODONE (OXY IR/ROXICODONE) 5 MG immediate release tablet Take 5 mg by mouth every 6 (six) hours as needed.   pantoprazole (PROTONIX) 40 MG tablet TAKE 1 TABLET BY MOUTH EVERY DAY   tamsulosin (FLOMAX) 0.4 MG CAPS capsule Take 2 capsules (0.8 mg total) by mouth daily.   tiZANidine (ZANAFLEX) 4 MG tablet Take 0.5-1 tablets (2-4 mg total) by mouth every 8 (eight) hours as needed for muscle spasms (Sedation caution).   No facility-administered encounter medications on file as of 11/26/2020.   Reviewed chart for medication changes ahead of medication coordination call.   OVs, Consults, or hospital visits since last care coordination call/Pharmacist visit.  11/12/20- Rexene Edison, NP- Pulmonology- Started famotidine 20 mg 11/06/20- Lawerance Cruel, PA-C, Cardiology  Medication changes indicated Rexene Edison, NP- Pulmonology- Started famotidine 20 mg  BP Readings from Last 3 Encounters:  11/12/20 126/72  11/06/20  123/73  10/25/20 (!) 102/54    Lab Results  Component Value Date   HGBA1C 7.3 (A) 10/25/2020     Patient obtains medications through Adherence Packaging  30 Days   Last adherence delivery date: 11/06/20, 30 DS  Patient is due for next adherence delivery on: 12/05/20  Called patient 11/26/20 and reviewed medications and coordinated delivery.  This delivery to include: Packs, 30 DS: Aspirin 81 mg - 1 tablet daily (1 breakfast)  Amlodipine 10 mg- 1 tablet daily (1 breakfast) Atorvastatin 40 mg- 1 tablet  daily (1 breakfast) Carvedilol 6.25 mg- 1 tablet twice daily (1 breakfast, 1 evening meal) Famotidine 20 mg- 1 tablet daily (1 bedtime) Glimepiride 4 mg- 1 tablet twice daily (1 breakfast, 1 evening meal) Isosorbide mononitrate 30 mg- 1 tablet daily (1 bedtime) Losartan 100 mg- 1 tablet daily- (1 bedtime) Metformin 1000 mg- 1 tablet twice daily (1 breakfast, 1 evening meal) Pantoprazole 40 mg- 1 tablet daily (1 breakfast) Tamsulosin 0.4 mg- 2 capsule daily (1 breakfast, 1 bedtime)     Patient declined the following medications  Amitriptyline 10 mg- PRN use- states he does not like to take Insulin syringe  31G X 5/16" 1 mL- 1 BID- States he has plenty on hand. No longer using vial One Touch Verio meter / test strips- use to check BG up to 6 times a day- States he has not started using the ones we sent last delivery Cyclobenzaprine 5 mg- PRN use Diclofenac Gel 1%- PRN use- buys OTC  Novolin N Flexpen 100 unit/mL (3 mL)- Inject 20 units twice daily - Pt reports he still has 3 pens remaining- (states 1 pen lasts about 1 week). Reports he may miss some doses.   Patient does not need refills of any medications  Confirmed delivery date of 12/05/20, advised patient that pharmacy will contact them the morning of delivery.  No blood pressure readings available  Blood sugar readings are as follows:  Fasting AM Bedtime 11/19/20  90  81 11/20/20  111  141 11/21/20  155  165 11/22/20  142  157 11/23/20  83  - 11/24/20  125  - 11/25/20  202  58 (states he drank orange juice and ate a piece of chocolate to bring it up) 11/26/20  104  Reminded patient of 15x15 rule  The 15-15 rule for low sugars: If sugar reading below 70, have 15 grams of carbohydrate to raise your blood sugar and check it after 15 minutes. If it's still below 70 mg/dL, have another serving. 15 grams of carbs may be: -Glucose tablets (see instructions) -Gel tube (see instructions) -4 ounces (1/2 cup) of juice or regular soda (not  diet) -1 tablespoon of sugar, honey, or corn syrup -Hard candies, jellybeans or gumdrops--see food label for how many to consume  Repeat these steps until your blood sugar is at least 70 mg/dL. Once your blood sugar is back to normal, eat a meal or snack to make sure it doesn't lower again.    Star Rating Drugs: Atorvastatin 40 mg  11/06/20  30 DS Glimepiride  4 mg    11/06/20  30 DS Losartan  100 mg    11/06/20  30 DS Metformin 1000 mg 11/06/20  30 DS  Patient states he has not heard anything about Ozempic for his patient assistance application. Explained I would follow up with Manufacturer and let him know.   Follow-Up:  Comptroller and Pharmacist Review  Debbora Dus, CPP notified  Margaretmary Dys, [place] [phone]  I have reviewed the care management and care coordination activities outlined in this encounter and I am certifying that I agree with the content of this note. Will go ahead and send the Novolin with this delivery. Patient should run out around 12/17/20. Contacted patient to discuss hypoglycemia. See addendum.  Debbora Dus, PharmD Clinical Pharmacist [facility] [phone]  Total time spent for month: CPA 45 CPP 15

## 2020-11-27 ENCOUNTER — Ambulatory Visit (INDEPENDENT_AMBULATORY_CARE_PROVIDER_SITE_OTHER): Payer: Medicare HMO

## 2020-11-27 ENCOUNTER — Telehealth: Payer: Self-pay

## 2020-11-27 DIAGNOSIS — Z794 Long term (current) use of insulin: Secondary | ICD-10-CM | POA: Diagnosis not present

## 2020-11-27 DIAGNOSIS — I10 Essential (primary) hypertension: Secondary | ICD-10-CM

## 2020-11-27 DIAGNOSIS — E1149 Type 2 diabetes mellitus with other diabetic neurological complication: Secondary | ICD-10-CM | POA: Diagnosis not present

## 2020-11-27 NOTE — Telephone Encounter (Signed)
Patient reports symptoms of hypoglycemia with low of 58 this week due to increased activity. This seems to happen on long work days.  He has some elevated fasting readings as well. He is interested in [place], considering CV  History and low risk of hypoglycemia -- with goal of reducing insulin if approved for assistance. We have sent in application for assistance.  If approved, plan to start Ozempic 0.25 mg and reduce Novolin by 20% to 32 units BID.   Blood sugar readings are as follows:                Fasting AM         Bedtime 11/19/20  90                  81 11/20/20  111                141 11/21/20  155                165 11/22/20  142                157 11/23/20  83                  - 11/24/20  125                - 11/25/20  202                58 (states he drank orange juice and ate a piece of chocolate to bring it up) 11/26/20  [place], PharmD Clinical Pharmacist [facility] at [facility] [phone]

## 2020-11-27 NOTE — Patient Instructions (Signed)
Dear Tony Manning,  Below is a summary of the goals we discussed during our follow up call on November 27, 2020. Please contact me anytime with questions or concerns.   Visit Information  Patient Care Plan: CCM Pharmacy Care Plan    Problem Identified: [facility] AMB "PATIENT-SPECIFIC PROBLEM"     Long-Range Goal: Patient Stated   Start Date: 11/27/2020  Priority: High  Note:    Current Barriers:  . Hypoglycemia . Med adherence   Pharmacist Clinical Goal(s):  [name] Kitchen Patient will adhere to plan to optimize therapeutic regimen for diabetes as evidenced by report of adherence to recommended medication management changes through collaboration with PharmD and provider.   Interventions: . 1:1 collaboration with Tony Ghent, MD regarding development and update of comprehensive plan of care as evidenced by provider attestation and co-signature . Inter-disciplinary care team collaboration (see longitudinal plan of care) . Comprehensive medication review performed; medication list updated in electronic medical record  Hypertension (BP goal <130/80) -Controlled -Current treatment:  Amlodipine 10 mg - 1 tablet daily (morning)  Carvedilol 6.25 mg - 1 tablet twice daily (morning and bedtime)  Losartan 100 mg - 1 tablet daily (bedtime) -Medications previously tried: lisinopril  - cough -Current home readings: checks daily with Omron arm cuff - always less than 130/80 -Continue to monitor BP at home daily, document, and provide log at future appointments -Recommended to continue current medication CMA to review BP readings during monthly adherence calls.  Hyperlipidemia: (LDL goal < 70) -Controlled -Current treatment:  Atorvastatin 40 mg - 1 tablet daily (morning) -Medications previously tried: none  -Recommended to continue current medication  Diabetes (A1c goal <7%) -Uncontrolled -Current medications:  Glimepiride 4 mg - 1 tablet twice daily  Insulin NPH Flexpen (Novolin N) -  Inject 20 units into skin BID before meals   Metformin 1000 mg - 1 tablet BID with meals -Medications previously tried: none reported -Adherence: continues to miss second dose of Novolin N about once weekly  -Current home glucose readings - checking daily BID  Blood sugar readings are as follows:                Fasting AM         Bedtime 11/19/20  90                  81 11/20/20  111                141 11/21/20  155                165 11/22/20  142                157 11/23/20  83                  - 11/24/20  125                - 11/25/20  202                58 (states he drank orange juice and ate a piece of chocolate to bring it up) 11/26/20  104   -Reports hypoglycemic symptoms frequently when he has a long work day -Current meal patterns:   Eats one large evening meal most days. Not hungry at breakfast. May eat lunch twice per week.   Drinking 3 sodas a day, likes caffeine in the morning. Drinks Sweet tea occasionally. Denies juices. Dislikes diet sodas. Drinks Therapist, nutritional. Reports this  is a decrease from his previous soda intake.  -Current exercise: stays busy at work -In process of applying for assistance to start Ozempic in hopes of stabilizing BG, CV risk reduction, and lowering hypoglycemia risk. Pt aware to call if he hears from manufacturer program. Will reduce insulin by 20% at that time.  -Educated on Prevention and management of hypoglycemic episodes; -Counseled to check feet daily and get yearly eye exams -Recommended to continue current medication for now. Pt to call if approved for Ozempic. Recommend 20% insulin dose reduction at that time (16 units BID).     Patient Goals/Self-Care Activities . Patient will:  - check glucose twice daily, document, and provide at future appointments   Follow Up Plan: The care management team will reach out to the patient again over the next 30 days.   Medication Assistance: Application for Ozempic  medication assistance program. in  process.  Anticipated assistance start date TBD.  See plan of care for additional detail.       The patient verbalized understanding of instructions, educational materials, and care plan provided today and agreed to receive a mailed copy of patient instructions, educational materials, and care plan.  The pharmacy team will reach out to the patient again over the next 30 days.   Tony Manning, [facility]  Preventing Hypoglycemia Hypoglycemia occurs when the level of sugar (glucose) in the blood is too low. Hypoglycemia can happen in people who do or do not have diabetes (diabetes mellitus). It can develop quickly, and it can be a medical emergency. For most people with diabetes, a blood glucose level below 70 mg/dL (3.9 mmol/L) is considered hypoglycemia. Glucose is a type of sugar that provides the body's main source of energy. Certain hormones (insulin and glucagon) control the level of glucose in the blood. Insulin lowers blood glucose, and glucagon increases blood glucose. Hypoglycemia can result from having too much insulin in the bloodstream, or from not eating enough food that contains glucose. Your risk for hypoglycemia is higher:  If you take insulin or diabetes medicines to help lower your blood glucose or help your body make more insulin.  If you skip or delay a meal or snack.  If you are ill.  During and after exercise. You can prevent hypoglycemia by working with your health care provider to adjust your meal plan as needed and by taking other precautions. How can hypoglycemia affect me? Mild symptoms Mild hypoglycemia may not cause any symptoms. If you do have symptoms, they may include:  Hunger.  Anxiety.  Sweating and feeling clammy.  Dizziness or feeling light-headed.  Sleepiness.  Nausea.  Increased heart rate.  Headache.  Blurry vision.  Irritability.  Tingling or numbness around the mouth, lips, or tongue.  A change in coordination.  Restless sleep. If  mild hypoglycemia is not recognized and treated, it can quickly become moderate or severe hypoglycemia. Moderate symptoms Moderate hypoglycemia can cause:  Mental confusion and poor judgment.  Behavior changes.  Weakness.  Irregular heartbeat. Severe symptoms Severe hypoglycemia is a medical emergency. It can cause:  Fainting.  Seizures.  Loss of consciousness (coma).  Death. What nutrition changes can be made?  Work with your health care provider or diet and nutrition specialist (dietitian) to make a healthy meal plan that is right for you. Follow your meal plan carefully.  Eat meals at regular times.  If recommended by your health care provider, have snacks between meals.  Donot skip or delay meals or snacks. You can  be at risk for hypoglycemia if you are not getting enough carbohydrates. What lifestyle changes can be made?  Work closely with your health care provider to manage your blood glucose. Make sure you know: ? Your goal blood glucose levels. ? How and when to check your blood glucose. ? The symptoms of hypoglycemia. It is important to treat it right away to keep it from becoming severe.  Do not drink alcohol on an empty stomach.  When you are ill, check your blood glucose more often than usual. Follow your sick day plan whenever you cannot eat or drink normally. Make this plan in advance with your health care provider.  Always check your blood glucose before, during, and after exercise.   How is this treated? This condition can often be treated by immediately eating or drinking something that contains sugar, such as:  Fruit juice, 4-6 oz (120-150 mL).  Regular (not diet) soda, 4-6 oz (120-150 mL).  Low-fat milk, 4 oz (120 mL).  Several pieces of hard candy.  Sugar or honey, 1 Tbsp (15 mL). Treating hypoglycemia if you have diabetes If you are alert and able to swallow safely, follow the 15:15 rule:  Take 15 grams of a rapid-acting carbohydrate. Talk  with your health care provider about how much you should take.  Rapid-acting options include: ? Glucose pills (take 15 grams). ? 6-8 pieces of hard candy. ? 4-6 oz (120-150 mL) of fruit juice. ? 4-6 oz (120-150 mL) of regular (not diet) soda.  Check your blood glucose 15 minutes after you take the carbohydrate.  If the repeat blood glucose level is still at or below 70 mg/dL (3.9 mmol/L), take 15 grams of a carbohydrate again.  If your blood glucose level does not increase above 70 mg/dL (3.9 mmol/L) after 3 tries, seek emergency medical care.  After your blood glucose level returns to normal, eat a meal or a snack within 1 hour. Treating severe hypoglycemia Severe hypoglycemia is when your blood glucose level is at or below 54 mg/dL (3 mmol/L). Severe hypoglycemia is a medical emergency. Get medical help right away. If you have severe hypoglycemia and you cannot eat or drink, you may need an injection of glucagon. A family member or close friend should learn how to check your blood glucose and how to give you a glucagon injection. Ask your health care provider if you need to have an emergency glucagon injection kit available. Severe hypoglycemia may need to be treated in a hospital. The treatment may include getting glucose through an IV. You may also need treatment for the cause of your hypoglycemia. Where to find more information  American Diabetes Association: www.diabetes.[employer] of Diabetes and Digestive and Kidney Diseases: [employer] Contact a health care provider if:  You have problems keeping your blood glucose in your target range.  You have frequent episodes of hypoglycemia. Get help right away if:  You continue to have hypoglycemia symptoms after eating or drinking something containing glucose.  Your blood glucose level is at or below 54 mg/dL (3 mmol/L).  You faint.  You have a seizure. These symptoms may represent a serious problem that is  an emergency. Do not wait to see if the symptoms will go away. Get medical help right away. Call your local emergency services (911 in the [place] Summary  Know the symptoms of hypoglycemia, and when you are at risk for it (such as during exercise or when you are sick). Check your blood glucose often  when you are at risk for hypoglycemia.  Hypoglycemia can develop quickly, and it can be dangerous if it is not treated right away. If you have a history of severe hypoglycemia, make sure you know how to use your glucagon injection kit.  Make sure you know how to treat hypoglycemia. Keep a carbohydrate snack available when you may be at risk for hypoglycemia. This information is not intended to replace advice given to you by your health care provider. Make sure you discuss any questions you have with your health care provider. Document Revised: 12/16/2018 Document Reviewed: 04/21/2017 Elsevier Patient Education  2021 [employer].

## 2020-11-27 NOTE — Progress Notes (Addendum)
Chronic Care Management Pharmacy Note  11/27/2020 Name:  Tony Manning MRN:  [id number] DOB:  December 20, 1960  Subjective: Tony Manning is an 60 y.o. year old male who is a primary patient of Tony Manning, Tony Rising, MD.  The CCM team was consulted for assistance with disease management and care coordination needs.    Engaged with patient by telephone for follow up visit in response to provider referral for pharmacy case management and/or care coordination services.   Consent to Services:  The patient was given information about Chronic Care Management services, agreed to services, and gave verbal consent prior to initiation of services.  Please see initial visit note for detailed documentation.   Patient Care Team: Tonia Ghent, MD as PCP - General (Family Medicine) Adrian Prows, MD as Consulting Physician (Cardiology) Debbora Dus, [facility] as Pharmacist (Pharmacist)  Last CCM 10/04/20  Recent office visits: 10/25/20 - PCP - Cont current meds, refer to GI  Recent consult visits: 11/12/20- Rexene Edison, NP- Pulmonology- Started famotidine 20 mg 11/06/20- Lawerance Cruel, PA-C, Cardiology  Hospital visits: None in previous 6 months  Objective:  Lab Results  Component Value Date   CREATININE 1.47 (H) 11/12/2020   BUN 25 (H) 11/12/2020   GFR 50.44 (L) 03/05/2020   GFRNONAA 54 (L) 11/12/2020   GFRAA 42 (L) 05/05/2019   NA 140 11/12/2020   K 4.7 11/12/2020   CALCIUM 10.0 11/12/2020   CO2 21 (L) 11/12/2020   GLUCOSE 168 (H) 11/12/2020    Lab Results  Component Value Date/Time   HGBA1C 7.3 (A) 10/25/2020 08:20 AM   HGBA1C 7.6 (A) 07/11/2020 08:55 AM   HGBA1C 7.8 (H) 03/05/2020 02:46 PM   HGBA1C 7.9 (H) 05/11/2019 12:14 PM   GFR 50.44 (L) 03/05/2020 02:46 PM   GFR 50.58 (L) 05/29/2019 12:48 PM   MICROALBUR 1.5 08/15/2012 09:05 AM    Last diabetic Eye exam:  Lab Results  Component Value Date/Time   HMDIABEYEEXA Retinopathy (A) 01/01/2020 12:00 AM    Last diabetic  Foot exam: No results found for: HMDIABFOOTEX   Lab Results  Component Value Date   CHOL 120 03/05/2020   HDL 43.00 03/05/2020   LDLCALC 59 03/05/2020   TRIG 89.0 03/05/2020   CHOLHDL 3 03/05/2020    Hepatic Function Latest Ref Rng & Units 03/05/2020 05/05/2019 05/05/2018  Total Protein 6.0 - 8.3 g/dL 7.5 7.8 7.7  Albumin 3.5 - 5.2 g/dL 4.6 4.1 4.5  AST 0 - 37 U/L $Remo'20 17 17  'SFRcs$ ALT 0 - 53 U/L $Remo'21 20 23  'UHMIc$ Alk Phosphatase 39 - 117 U/L 67 77 77  Total Bilirubin 0.2 - 1.2 mg/dL 0.5 1.1 0.4  Bilirubin, Direct 0.0 - 0.3 mg/dL - - -    Lab Results  Component Value Date/Time   TSH 2.22 03/21/2018 12:05 PM   TSH 1.42 05/07/2017 01:30 PM    CBC Latest Ref Rng & Units 11/12/2020 03/05/2020 05/29/2019  WBC 4.0 - 10.5 K/uL 6.8 8.1 8.5  Hemoglobin 13.0 - 17.0 g/dL 13.3 13.6 13.7  Hematocrit 39.0 - 52.0 % 37.9(L) 39.1 40.8  Platelets 150 - 400 K/uL 215 209.0 236.0    Lab Results  Component Value Date/Time   VD25OH 19.37 (L) 03/21/2018 12:05 PM    Clinical ASCVD: Yes  The ASCVD Risk score Mikey Bussing DC Jr., et al., 2013) failed to calculate for the following reasons:   The patient has a prior MI or stroke diagnosis    Depression screen [facility] 2/9 09/20/2020 04/21/2018  10/11/2017  Decreased Interest 0 0 0  Down, Depressed, Hopeless 0 0 0  PHQ - 2 Score 0 0 0  Altered sleeping 0 - -  Tired, decreased energy 0 - -  Change in appetite 0 - -  Feeling bad or failure about yourself  0 - -  Trouble concentrating 0 - -  Moving slowly or fidgety/restless 0 - -  Suicidal thoughts 0 - -  PHQ-9 Score 0 - -  Difficult doing work/chores Not difficult at all - -  Some recent data might be hidden     Social History   Tobacco Use  Smoking Status Never Smoker  Smokeless Tobacco Former Neurosurgeon   Types: Chew   BP Readings from Last 3 Encounters:  11/12/20 126/72  11/06/20 123/73  10/25/20 (!) 102/54   Pulse Readings from Last 3 Encounters:  11/12/20 61  11/06/20 60  10/25/20 60   Wt Readings from Last 3  Encounters:  11/12/20 222 lb (100.7 kg)  11/06/20 220 lb 9.6 oz (100.1 kg)  10/25/20 218 lb (98.9 kg)   BMI Readings from Last 3 Encounters:  11/12/20 31.85 kg/m  11/06/20 31.65 kg/m  10/25/20 31.28 kg/m    Assessment/Interventions: Review of patient past medical history, allergies, medications, health status, including review of consultants reports, laboratory and other test data, was performed as part of comprehensive evaluation and provision of chronic care management services.   SDOH:  (Social Determinants of Health) assessments and interventions performed: Yes SDOH Interventions   Flowsheet Row Most Recent Value  SDOH Interventions   Financial Strain Interventions --  [Application for med assistance in process]      CCM Care Plan  Allergies  Allergen Reactions   Ambien [Zolpidem] Other (See Comments)    Parasomnias, sleep walking   Lisinopril Cough   Tramadol Diarrhea    Medications Reviewed Today    Reviewed by Rosaland Lao, CMA (Certified Medical Assistant) on 11/12/20 at [phone]  Med List Status: <None>  Medication Order Taking? Sig Documenting Provider Last Dose Status Informant  amitriptyline (ELAVIL) 10 MG tablet [id number] Yes Take 1 tablet (10 mg total) by mouth at bedtime as needed for sleep. Joaquim Nam, MD Taking Active   amLODipine (NORVASC) 10 MG tablet [id number] Yes Take 1 tablet (10 mg total) by mouth daily. Cantwell, Celeste C, PA-C Taking Active   aspirin 81 MG chewable tablet [id number] Yes Chew 81 mg by mouth at bedtime. [provider] Taking Active Self  atorvastatin (LIPITOR) 40 MG tablet [id number] Yes Take 1 tablet (40 mg total) by mouth daily. Joaquim Nam, MD Taking Active   Blood Glucose Monitoring Suppl [facility] VERIO) w/Device Andria Rhein [id number] Yes Use to check blood sugars TID. Dx: E11.9 Joaquim Nam, MD Taking Active   carvedilol (COREG) 6.25 MG tablet [id number] Yes TAKE 1 TABLET BY MOUTH TWICE A DAY Yates Decamp, MD  Taking Active   cyclobenzaprine (FLEXERIL) 5 MG tablet [id number] Yes Take 5 mg by mouth 2 (two) times daily. [provider] Taking Active   diclofenac Sodium (VOLTAREN) 1 % GEL [id number] Yes Apply 2-4 g topically 4 (four) times daily. Joaquim Nam, MD Taking Active   glimepiride (AMARYL) 4 MG tablet [id number] Yes TAKE 1 TABLET BY MOUTH TWICE A DAY Joaquim Nam, MD Taking Active   glucose blood [facility] VERIO) test strip [id number] Yes Use to check blood sugars TID. Dx: E11.9 Joaquim Nam, MD Taking Active   ibuprofen (ADVIL) 600 MG tablet [id number]  Yes Take 600 mg by mouth every 8 (eight) hours as needed. [provider] Taking Active   Insulin NPH, Human,, Isophane, (NOVOLIN N FLEXPEN) 100 UNIT/ML Mayer Masker [id number] Yes Inject 20 Units into the skin in the morning and at bedtime. Tonia Ghent, MD Taking Active   Insulin Pen Needle 32G X 4 MM MISC [id number] Yes Use daily with insulin pen. Tonia Ghent, MD Taking Active Self  Insulin Syringe-Needle U-100 (INSULIN SYRINGE 1CC/31GX5/16") 31G X 5/16" 1 ML MISC [id number] Yes Use with NPH insulin twice daily. Tonia Ghent, MD Taking Active   isosorbide mononitrate (IMDUR) 30 MG 24 hr tablet [id number] Yes Take 1 tablet (30 mg total) by mouth daily. Cantwell, Celeste C, PA-C Taking Active   Lancets [facility] ULTRASOFT) lancets [id number] Yes Use to check blood sugars TID. Dx: E11.9 Tonia Ghent, MD Taking Active   losartan (COZAAR) 100 MG tablet [id number] Yes TAKE 1 TABLET BY MOUTH EVERY DAY Adrian Prows, MD Taking Active   metFORMIN (GLUCOPHAGE) 1000 MG tablet [id number] Yes Take 1 tablet (1,000 mg total) by mouth 2 (two) times daily with a meal. Tonia Ghent, MD Taking Active   nitroGLYCERIN (NITROSTAT) 0.4 MG SL tablet [id number] Yes Place 1 tablet (0.4 mg total) under the tongue every 5 (five) minutes as needed for chest pain. Miquel Dunn, NP Taking Active   ondansetron [facility]) 4 MG tablet [id number]  Yes Take 4 mg by mouth every 8 (eight) hours as needed. [provider] Taking Active   oxyCODONE (OXY IR/ROXICODONE) 5 MG immediate release tablet [id number] Yes Take 5 mg by mouth every 6 (six) hours as needed. [provider] Taking Active   pantoprazole (PROTONIX) 40 MG tablet [id number] Yes TAKE 1 TABLET BY MOUTH EVERY DAY Tonia Ghent, MD Taking Active   tamsulosin [facility]) 0.4 MG CAPS capsule [id number] Yes Take 2 capsules (0.8 mg total) by mouth daily. Tonia Ghent, MD Taking Active   tiZANidine (ZANAFLEX) 4 MG tablet [id number] Yes Take 0.5-1 tablets (2-4 mg total) by mouth every 8 (eight) hours as needed for muscle spasms (Sedation caution). Tonia Ghent, MD Taking Active           Patient Active Problem List   Diagnosis Date Noted   Dyspnea 11/12/2020   Sarcoidosis 11/12/2020   H/O colonoscopy 10/27/2020   Nocturia 05/03/2020   Right sided weakness 03/06/2020   Low back pain 01/29/2020   Facial weakness 11/16/2019   Hematuria 03/22/2018   Renal stones 03/22/2018   Vitamin D deficiency, unspecified 10/04/2017   Chronic neck pain (Primary Area of Pain) (B) (L>R) 09/21/2017   Chronic upper extremity pain (Secondary Area of Pain) (Bilateral) (L>R) 09/21/2017   Chronic left shoulder pain (Tertiary Area of Pain) 09/21/2017   Chronic low back pain (Fourth Area of Pain) (B) (R>L) 09/21/2017   Chronic pain of lower extremity (B) (R>L) 09/21/2017   Other long term (current) drug therapy 09/21/2017   Other specified health status 09/21/2017   Other chronic pain 08/22/2017   Other fatigue 05/10/2017   Radiculopathy 04/07/2017   Angina pectoris ([place]) 10/15/2016   Cervical radiculitis 07/23/2016   Dysphagia 06/12/2016   Sciatica 02/06/2016   Plantar fasciitis 01/16/2016   Right sided sciatica 05/16/2015   Hand pain 04/05/2015   Premature ejaculation 09/17/2014   Heel pain 09/17/2014   NSTEMI (non-ST elevated myocardial  infarction) ([place]) 06/13/2014   Atypical chest pain 06/06/2014   Memory change 05/24/2014   Benign paroxysmal positional vertigo  07/13/2013   Orthostasis 07/13/2013   Migraine, unspecified, without mention of intractable migraine without mention of status migrainosus 04/03/2013   Medicare annual wellness visit, initial 02/17/2013   Pain in joint, ankle and foot 10/19/2012   Irritable mood 01/18/2012   Tobacco abuse 12/21/2011   Neck pain 12/03/2010   Hearing loss 12/03/2010   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   HYPERCHOLESTEROLEMIA 10/20/2010   DEPRESSION 10/20/2010   Osteoarthritis 10/20/2010   ARTHRITIS 10/20/2010   NEPHROLITHIASIS, HX OF 10/20/2010   ESSENTIAL HYPERTENSION, BENIGN 09/30/2010   CORONARY ATHEROSCLEROSIS NATIVE CORONARY ARTERY 09/30/2010   Sleep apnea 09/30/2010   CHEST PAIN UNSPECIFIED 09/30/2010   PERCUTANEOUS TRANSLUMINAL CORONARY ANGIOPLASTY, HX OF 09/30/2010    Immunization History  Administered Date(s) Administered   Influenza Inj Mdck Quad Pf 06/02/2018   Influenza Whole 09/17/2010   Influenza,inj,Quad PF,6+ Mos 06/08/2013, 05/22/2014, 05/12/2016, 05/08/2019, 05/27/2020   Influenza-Unspecified 06/08/2013, 05/22/2014, 06/08/2015, 05/12/2016, 05/21/2017, 06/02/2018, 05/09/2019   PFIZER(Purple Top)SARS-COV-2 Vaccination 11/27/2019, 12/25/2019, 08/07/2020   Pneumococcal Polysaccharide-23 09/08/2007, 02/16/2013   Td 09/07/2009    Conditions to be addressed/monitored:  Hypertension, Hyperlipidemia and Diabetes  Care Plan : [place]  Updates made by Debbora Dus, [place] since 11/27/2020 12:00 AM    Problem: CHL AMB "PATIENT-SPECIFIC PROBLEM"     Long-Range Goal: Patient Stated   Start Date: 11/27/2020  Priority: High  Note:    Current Barriers:   Hypoglycemia  Med adherence   Pharmacist Clinical Goal(s):   Patient will adhere to plan to optimize therapeutic regimen for  diabetes as evidenced by report of adherence to recommended medication management changes through collaboration with PharmD and provider.   Interventions:  1:1 collaboration with Tonia Ghent, MD regarding development and update of comprehensive plan of care as evidenced by provider attestation and co-signature  Inter-disciplinary care team collaboration (see longitudinal plan of care)  Comprehensive medication review performed; medication list updated in electronic medical record  Hypertension (BP goal <130/80) -Controlled -Current treatment:  Amlodipine 10 mg - 1 tablet daily (morning)  Carvedilol 6.25 mg - 1 tablet twice daily (morning and bedtime)  Losartan 100 mg - 1 tablet daily (bedtime) -Medications previously tried: lisinopril  - cough -Current home readings: checks daily with Omron arm cuff - always less than 130/80 -Continue to monitor BP at home daily, document, and provide log at future appointments -Recommended to continue current medication CMA to review BP readings during monthly adherence calls.  Hyperlipidemia: (LDL goal < 70) -Controlled -Current treatment:  Atorvastatin 40 mg - 1 tablet daily (morning) -Medications previously tried: none  -Recommended to continue current medication  Diabetes (A1c goal <7%) -Uncontrolled -Current medications:  Glimepiride 4 mg - 1 tablet twice daily  Insulin NPH Flexpen (Novolin N) - Inject 20 units into skin BID before meals   Metformin 1000 mg - 1 tablet BID with meals -Medications previously tried: none reported -Adherence: continues to miss second dose of Novolin N about once weekly  -Current home glucose readings - checking daily BID  Blood sugar readings are as follows:                Fasting AM         Bedtime 11/19/20  90                  81 11/20/20  111                141 11/21/20  155  165 11/22/20  142                157 11/23/20  83                  - 11/24/20  125                 - 11/25/20  202                58 (states he drank orange juice and ate a piece of chocolate to bring it up) 11/26/20  104   -Reports hypoglycemic symptoms frequently when he has a long work day -Current meal patterns:   Eats one large evening meal most days. Not hungry at breakfast. May eat lunch twice per week.   Drinking 3 sodas a day, likes caffeine in the morning. Drinks Sweet tea occasionally. Denies juices. Dislikes diet sodas. Drinks Therapist, nutritional. Reports this is a decrease from his previous soda intake.  -Current exercise: stays busy at work -In process of applying for assistance to start Ozempic in hopes of stabilizing BG, CV risk reduction, and lowering hypoglycemia risk. Pt aware to call if he hears from manufacturer program. Will reduce insulin by 20% at that time.  -Educated on Prevention and management of hypoglycemic episodes; -Counseled to check feet daily and get yearly eye exams -Recommended to continue current medication for now. Pt to call if approved for Ozempic. Recommend 20% insulin dose reduction at that time (16 units BID).     Patient Goals/Self-Care Activities  Patient will:  - check glucose twice daily, document, and provide at future appointments   Follow Up Plan: The care management team will reach out to the patient again over the next 30 days.   Medication Assistance: Application for Ozempic  medication assistance program. in process.  Anticipated assistance start date TBD.  See plan of care for additional detail.      Patient's preferred pharmacy is:  [facility] - [place], [place] - [address] [place] [address] Phone: [phone] Fax: [phone]  Reports adherence packaging is working well for him.  Care Plan and Follow Up Patient Decision:  Patient agrees to Care Plan and Follow-up.  Debbora Dus, PharmD Clinical Pharmacist [place] [facility] at [facility] [phone]    Encounter details: CCM Time Spent      Value Time User   Time spent with patient (minutes)  30 11/27/2020 11:44 AM Debbora Dus, [facility]   Time spent performing Chart review  30 11/27/2020 11:42 AM Debbora Dus, [facility]   Total time (minutes)  60 11/27/2020 11:44 AM Debbora Dus, RPH     Moderate to High Complex Decision Making      Value Time User   Moderate to High complex decision making  Yes 11/27/2020 11:42 AM Debbora Dus, [facility]     CCM Services: This encounter meets complex CCM services and moderate to high decision making.  Prior to outreach and patient consent for Chronic Care Management, I referred this patient for services after reviewing the nominated patient list or from a personal encounter with the patient.  I have personally reviewed this encounter including the documentation in this note and have collaborated with the care management provider regarding care management and care coordination activities to include development and update of the comprehensive care plan. I am certifying that I agree with the content of this note and encounter as supervising physician.  [name]

## 2020-11-27 NOTE — Telephone Encounter (Signed)
I have a question about his insulin dosing.  I thought he was taking 20 units twice a day.  A 20% reduction would be 16 units twice a day.  If I need to adjust his med list, let me know.  Let me know if/when I need to send the prescription for Ozempic.  Thanks.

## 2020-11-27 NOTE — Telephone Encounter (Signed)
Contacted patient to review Novolin N adherence. Patient reports about once a week he missed evening dose of Novolin. Taking 20 units BID most days. Reports frequent symptoms of hypoglycemia on days he is very active. Usually only eats once daily. Takes first dose Novolin 7-8:30 AM. Usually takes second dose between 4:30-5:30 PM before supper. Takes metformin 1000 mg BID and glimepiride 4 mg BID without missed doses. We discussed application for Ozempic. He is aware not to start medication until further contact from clinic.    Debbora Dus, PharmD Clinical Pharmacist [place] [facility] at [facility] [phone]

## 2020-11-27 NOTE — Telephone Encounter (Signed)
Oh yes, I was thinking starting dose was 40 units BID, my error. You are correct 16 units BID will be new dose.  Ozempic rx is not needed at this time. We sent it in with his patient assistance application.  Thank you!  Debbora Dus, PharmD Clinical Pharmacist [place] [facility] at [facility] [phone]

## 2020-11-28 DIAGNOSIS — M25561 Pain in right knee: Secondary | ICD-10-CM | POA: Diagnosis not present

## 2020-12-03 DIAGNOSIS — M1711 Unilateral primary osteoarthritis, right knee: Secondary | ICD-10-CM | POA: Diagnosis not present

## 2020-12-06 ENCOUNTER — Telehealth: Payer: Self-pay

## 2020-12-06 NOTE — Chronic Care Management (AMB) (Addendum)
Chronic Care Management Pharmacy Assistant   Name: Tony Manning  MRN: [id number] DOB: 09/05/1961  Reason for Encounter: Medication Review- Novolin and Patient Assistance Update- Ozempic  Recent office visits:  None since last CCM contact  Recent consult visits:  None since last CCM contact  Hospital visits:  None in previous 6 months  Medications: Outpatient Encounter Medications as of 12/06/2020  Medication Sig   amitriptyline (ELAVIL) 10 MG tablet Take 1 tablet (10 mg total) by mouth at bedtime as needed for sleep.   amLODipine (NORVASC) 10 MG tablet Take 1 tablet (10 mg total) by mouth daily.   aspirin 81 MG chewable tablet Chew 81 mg by mouth at bedtime.   atorvastatin (LIPITOR) 40 MG tablet Take 1 tablet (40 mg total) by mouth daily.   Blood Glucose Monitoring Suppl (ONETOUCH VERIO) w/Device KIT Use to check blood sugars TID. Dx: E11.9   carvedilol (COREG) 6.25 MG tablet TAKE 1 TABLET BY MOUTH TWICE A DAY   cyclobenzaprine (FLEXERIL) 5 MG tablet Take 5 mg by mouth 2 (two) times daily.   diclofenac Sodium (VOLTAREN) 1 % GEL Apply 2-4 g topically 4 (four) times daily.   famotidine (PEPCID) 20 MG tablet Take 1 tablet (20 mg total) by mouth at bedtime.   glimepiride (AMARYL) 4 MG tablet TAKE 1 TABLET BY MOUTH TWICE A DAY   glucose blood (ONETOUCH VERIO) test strip Use to check blood sugars TID. Dx: E11.9   ibuprofen (ADVIL) 600 MG tablet Take 600 mg by mouth every 8 (eight) hours as needed.   Insulin NPH, Human,, Isophane, (NOVOLIN N FLEXPEN) 100 UNIT/ML Kiwkpen Inject 20 Units into the skin in the morning and at bedtime.   Insulin Pen Needle 32G X 4 MM MISC Use daily with insulin pen.   Insulin Syringe-Needle U-100 (INSULIN SYRINGE 1CC/31GX5/16") 31G X 5/16" 1 ML MISC Use with NPH insulin twice daily.   isosorbide mononitrate (IMDUR) 30 MG 24 hr tablet Take 1 tablet (30 mg total) by mouth daily.   Lancets (ONETOUCH ULTRASOFT) lancets Use to check blood sugars TID. Dx:  E11.9   losartan (COZAAR) 100 MG tablet TAKE 1 TABLET BY MOUTH EVERY DAY   metFORMIN (GLUCOPHAGE) 1000 MG tablet Take 1 tablet (1,000 mg total) by mouth 2 (two) times daily with a meal.   nitroGLYCERIN (NITROSTAT) 0.4 MG SL tablet Place 1 tablet (0.4 mg total) under the tongue every 5 (five) minutes as needed for chest pain.   ondansetron (ZOFRAN) 4 MG tablet Take 4 mg by mouth every 8 (eight) hours as needed.   oxyCODONE (OXY IR/ROXICODONE) 5 MG immediate release tablet Take 5 mg by mouth every 6 (six) hours as needed.   pantoprazole (PROTONIX) 40 MG tablet TAKE 1 TABLET BY MOUTH EVERY DAY   tamsulosin (FLOMAX) 0.4 MG CAPS capsule Take 2 capsules (0.8 mg total) by mouth daily.   tiZANidine (ZANAFLEX) 4 MG tablet Take 0.5-1 tablets (2-4 mg total) by mouth every 8 (eight) hours as needed for muscle spasms (Sedation caution).   No facility-administered encounter medications on file as of 12/06/2020.    Patient called to inform he had 2 pens of Novolin left and each pen lasts about 1 week. Acute form was uploaded for delivery on 12/16/20 by [facility].  Also contacted [employer]  to follow up on patient assistance application for Ozempic. Per representative at [employer] they have not received any portion of the application. Application was faxed 0/71/21. Representative states they have a  new fax number [phone]. Will have application re-faxed.    Patient also states he had a high fasting BG reading on 12/04/20 of 496. States he did have cortisone injection on 12/03/20 and had a donut that evening. Patient notes cortisone does usually raise his blood sugar. Denies any hyperglycemia symptoms.   Follow-Up:  Patient Assistance Coordination and Pharmacist Review  Debbora Dus, CPP notified  Margaretmary Dys, [place] Assistant [phone]  I have reviewed the care management and care coordination activities outlined in this encounter and I am certifying that I agree with  the content of this note. No further action required.  Debbora Dus, PharmD Clinical Pharmacist [place] [facility] at [facility] [phone]

## 2020-12-10 NOTE — Telephone Encounter (Signed)
Re-faxed Ozempic application 02/12/00.  Debbora Dus, PharmD Clinical Pharmacist [place] [facility] at [facility] [phone]

## 2020-12-16 NOTE — Chronic Care Management (AMB) (Addendum)
[place]  to follow up on patient assistance application for Ozempic. Per representative, Doris, at [employer] they have not received any portion of the application. Application was re-faxed by Debbora Dus 03/15/01 will send application again.    Follow-Up:  Patient Assistance Coordination  Debbora Dus, CPP notified  Margaretmary Dys, [place] [facility] Assistant [phone]

## 2020-12-17 NOTE — Telephone Encounter (Signed)
It was determined that application was missing social security number. This was obtained from patient and application re-faxed 4/73/95.  Debbora Dus, PharmD Clinical Pharmacist [place] [facility] at [facility] [phone]

## 2020-12-18 ENCOUNTER — Telehealth: Payer: Self-pay

## 2020-12-18 NOTE — Chronic Care Management (AMB) (Addendum)
Chronic Care Management Pharmacy Assistant   Name: Tony Manning  MRN: [id number] DOB: 1960-09-29  Reason for Encounter: Medication Review- Insulin delivery coordination  Medications: Outpatient Encounter Medications as of 12/18/2020  Medication Sig   amitriptyline (ELAVIL) 10 MG tablet Take 1 tablet (10 mg total) by mouth at bedtime as needed for sleep.   amLODipine (NORVASC) 10 MG tablet Take 1 tablet (10 mg total) by mouth daily.   aspirin 81 MG chewable tablet Chew 81 mg by mouth at bedtime.   atorvastatin (LIPITOR) 40 MG tablet Take 1 tablet (40 mg total) by mouth daily.   Blood Glucose Monitoring Suppl (ONETOUCH VERIO) w/Device KIT Use to check blood sugars TID. Dx: E11.9   carvedilol (COREG) 6.25 MG tablet TAKE 1 TABLET BY MOUTH TWICE A DAY   cyclobenzaprine (FLEXERIL) 5 MG tablet Take 5 mg by mouth 2 (two) times daily.   diclofenac Sodium (VOLTAREN) 1 % GEL Apply 2-4 g topically 4 (four) times daily.   famotidine (PEPCID) 20 MG tablet Take 1 tablet (20 mg total) by mouth at bedtime.   glimepiride (AMARYL) 4 MG tablet TAKE 1 TABLET BY MOUTH TWICE A DAY   glucose blood (ONETOUCH VERIO) test strip Use to check blood sugars TID. Dx: E11.9   ibuprofen (ADVIL) 600 MG tablet Take 600 mg by mouth every 8 (eight) hours as needed.   Insulin NPH, Human,, Isophane, (NOVOLIN N FLEXPEN) 100 UNIT/ML Kiwkpen Inject 20 Units into the skin in the morning and at bedtime.   Insulin Pen Needle 32G X 4 MM MISC Use daily with insulin pen.   Insulin Syringe-Needle U-100 (INSULIN SYRINGE 1CC/31GX5/16") 31G X 5/16" 1 ML MISC Use with NPH insulin twice daily.   isosorbide mononitrate (IMDUR) 30 MG 24 hr tablet Take 1 tablet (30 mg total) by mouth daily.   Lancets (ONETOUCH ULTRASOFT) lancets Use to check blood sugars TID. Dx: E11.9   losartan (COZAAR) 100 MG tablet TAKE 1 TABLET BY MOUTH EVERY DAY   metFORMIN (GLUCOPHAGE) 1000 MG tablet Take 1 tablet (1,000 mg total) by mouth 2 (two) times daily  with a meal.   nitroGLYCERIN (NITROSTAT) 0.4 MG SL tablet Place 1 tablet (0.4 mg total) under the tongue every 5 (five) minutes as needed for chest pain.   ondansetron (ZOFRAN) 4 MG tablet Take 4 mg by mouth every 8 (eight) hours as needed.   oxyCODONE (OXY IR/ROXICODONE) 5 MG immediate release tablet Take 5 mg by mouth every 6 (six) hours as needed.   pantoprazole (PROTONIX) 40 MG tablet TAKE 1 TABLET BY MOUTH EVERY DAY   tamsulosin (FLOMAX) 0.4 MG CAPS capsule Take 2 capsules (0.8 mg total) by mouth daily.   tiZANidine (ZANAFLEX) 4 MG tablet Take 0.5-1 tablets (2-4 mg total) by mouth every 8 (eight) hours as needed for muscle spasms (Sedation caution).   No facility-administered encounter medications on file as of 12/18/2020.   Patient called to state that he has not yet received his Novolin N quick pen from [facility]. Notes that he has about 2 days remaining of insulin. He states otherwise he is doing well. Coordinated refill to home.   Follow-Up:  Coordination of Enhanced Pharmacy Services and Pharmacist Review  Phil Dopp, CPP notified  Tony Manning, [facility] Clinical Pharmacy Assistant [phone]  I have reviewed the care management and care coordination activities outlined in this encounter and I am certifying that I agree with the content of this note. No further action required.  Phil Dopp, PharmD  Clinical Pharmacist [facility] at [facility] [phone]

## 2020-12-19 DIAGNOSIS — M5416 Radiculopathy, lumbar region: Secondary | ICD-10-CM | POA: Diagnosis not present

## 2020-12-20 ENCOUNTER — Ambulatory Visit (INDEPENDENT_AMBULATORY_CARE_PROVIDER_SITE_OTHER): Payer: Medicare HMO

## 2020-12-20 DIAGNOSIS — Z794 Long term (current) use of insulin: Secondary | ICD-10-CM | POA: Diagnosis not present

## 2020-12-20 DIAGNOSIS — E1149 Type 2 diabetes mellitus with other diabetic neurological complication: Secondary | ICD-10-CM | POA: Diagnosis not present

## 2020-12-20 DIAGNOSIS — I10 Essential (primary) hypertension: Secondary | ICD-10-CM | POA: Diagnosis not present

## 2020-12-20 NOTE — Progress Notes (Addendum)
Chronic Care Management Pharmacy Note  Name:  Tony Manning MRN:  [id number] DOB:  1961-04-01  12/20/20 Subjective: Tony Manning is an 60 y.o. year old male who is a primary patient of Tony Manning, Tony Rising, MD.  The CCM team was consulted for assistance with disease management and care coordination needs.    Engaged with patient by telephone for follow up visit in response to provider referral for pharmacy case management and/or care coordination services.   Consent to Services:  The patient was given information about Chronic Care Management services, agreed to services, and gave verbal consent prior to initiation of services.  Please see initial visit note for detailed documentation.   Patient Care Team: Tony Ghent, MD as PCP - General (Family Medicine) Tony Prows, MD as Consulting Physician (Cardiology) Tony Manning, [facility] as Pharmacist (Pharmacist)  Recent office visits: 10/25/20 - PCP - Cont current meds, refer to GI  Recent consult visits: 11/12/20- Tony Edison, NP- Pulmonology- Started famotidine 20 mg 11/06/20- Tony Cruel, PA-C, Cardiology  Hospital visits: None in previous 6 months  Objective:  Lab Results  Component Value Date   CREATININE 1.47 (H) 11/12/2020   BUN 25 (H) 11/12/2020   GFR 50.44 (L) 03/05/2020   GFRNONAA 54 (L) 11/12/2020   GFRAA 42 (L) 05/05/2019   NA 140 11/12/2020   K 4.7 11/12/2020   CALCIUM 10.0 11/12/2020   CO2 21 (L) 11/12/2020   GLUCOSE 168 (H) 11/12/2020    Lab Results  Component Value Date/Time   HGBA1C 7.3 (A) 10/25/2020 08:20 AM   HGBA1C 7.6 (A) 07/11/2020 08:55 AM   HGBA1C 7.8 (H) 03/05/2020 02:46 PM   HGBA1C 7.9 (H) 05/11/2019 12:14 PM   GFR 50.44 (L) 03/05/2020 02:46 PM   GFR 50.58 (L) 05/29/2019 12:48 PM   MICROALBUR 1.5 08/15/2012 09:05 AM    Last diabetic Eye exam:  Lab Results  Component Value Date/Time   HMDIABEYEEXA Retinopathy (A) 01/01/2020 12:00 AM    Last diabetic Foot exam:  10/25/20  normal   Lab Results  Component Value Date   CHOL 120 03/05/2020   HDL 43.00 03/05/2020   LDLCALC 59 03/05/2020   TRIG 89.0 03/05/2020   CHOLHDL 3 03/05/2020    Hepatic Function Latest Ref Rng & Units 03/05/2020 05/05/2019 05/05/2018  Total Protein 6.0 - 8.3 g/dL 7.5 7.8 7.7  Albumin 3.5 - 5.2 g/dL 4.6 4.1 4.5  AST 0 - 37 U/L $Remo'20 17 17  'iIhrz$ ALT 0 - 53 U/L $Remo'21 20 23  'etMVi$ Alk Phosphatase 39 - 117 U/L 67 77 77  Total Bilirubin 0.2 - 1.2 mg/dL 0.5 1.1 0.4  Bilirubin, Direct 0.0 - 0.3 mg/dL - - -    Lab Results  Component Value Date/Time   TSH 2.22 03/21/2018 12:05 PM   TSH 1.42 05/07/2017 01:30 PM    CBC Latest Ref Rng & Units 11/12/2020 03/05/2020 05/29/2019  WBC 4.0 - 10.5 K/uL 6.8 8.1 8.5  Hemoglobin 13.0 - 17.0 g/dL 13.3 13.6 13.7  Hematocrit 39.0 - 52.0 % 37.9(L) 39.1 40.8  Platelets 150 - 400 K/uL 215 209.0 236.0    Lab Results  Component Value Date/Time   VD25OH 19.37 (L) 03/21/2018 12:05 PM    Clinical ASCVD: Yes  The ASCVD Risk score Mikey Bussing DC Jr., et al., 2013) failed to calculate for the following reasons:   The patient has a prior MI or stroke diagnosis    Depression screen Tony Manning 2/9 09/20/2020 04/21/2018 10/11/2017  Decreased Interest 0 0  0  Down, Depressed, Hopeless 0 0 0  PHQ - 2 Score 0 0 0  Altered sleeping 0 - -  Tired, decreased energy 0 - -  Change in appetite 0 - -  Feeling bad or failure about yourself  0 - -  Trouble concentrating 0 - -  Moving slowly or fidgety/restless 0 - -  Suicidal thoughts 0 - -  PHQ-9 Score 0 - -  Difficult doing work/chores Not difficult at all - -  Some recent data might be hidden     Social History   Tobacco Use  Smoking Status Never Smoker  Smokeless Tobacco Former Systems developer  . Types: Chew   BP Readings from Last 3 Encounters:  11/12/20 126/72  11/06/20 123/73  10/25/20 (!) 102/54   Pulse Readings from Last 3 Encounters:  11/12/20 61  11/06/20 60  10/25/20 60   Wt Readings from Last 3 Encounters:  11/12/20 222 lb (100.7  kg)  11/06/20 220 lb 9.6 oz (100.1 kg)  10/25/20 218 lb (98.9 kg)   BMI Readings from Last 3 Encounters:  11/12/20 31.85 kg/m  11/06/20 31.65 kg/m  10/25/20 31.28 kg/m    Assessment/Interventions: Review of patient past medical history, allergies, medications, health status, including review of consultants reports, laboratory and other test data, was performed as part of comprehensive evaluation and provision of chronic care management services.   SDOH:  (Social Determinants of Health) assessments and interventions performed: Yes SDOH Interventions   Flowsheet Row Most Recent Value  SDOH Interventions   Financial Strain Interventions Other (Comment)  [Ozempic patient assistance approved]      CCM Care Plan  Allergies  Allergen Reactions  . Ambien [Zolpidem] Other (See Comments)    Parasomnias, sleep walking  . Lisinopril Cough  . Tramadol Diarrhea    Medications Reviewed Today    Reviewed by Tony Manning, [facility] (Pharmacist) on 12/30/20 at [place] List Status: <None>  Medication Order Taking? Sig Documenting Provider Last Dose Status Informant  amitriptyline (ELAVIL) 10 MG tablet [id number] No Take 1 tablet (10 mg total) by mouth at bedtime as needed for sleep.  Patient not taking: Reported on 12/30/2020   Tony Ghent, MD Not Taking Active   amLODipine (NORVASC) 10 MG tablet [id number] Yes Take 1 tablet (10 mg total) by mouth daily. Manning, Tony C, PA-C Taking Active   aspirin 81 MG chewable tablet [id number] Yes Chew 81 mg by mouth at bedtime. [provider] Taking Active Self  atorvastatin (LIPITOR) 40 MG tablet [id number] Yes Take 1 tablet (40 mg total) by mouth daily. Tony Ghent, MD Taking Active   Blood Glucose Monitoring Suppl [facility] VERIO) w/Device Tony Manning [id number] Yes Use to check blood sugars TID. Dx: E11.9 Tony Ghent, MD Taking Active   carvedilol (COREG) 6.25 MG tablet [id number] Yes TAKE 1 TABLET BY MOUTH TWICE A DAY Tony Prows, MD  Taking Active   cyclobenzaprine (FLEXERIL) 5 MG tablet [id number]  Take 5 mg by mouth 2 (two) times daily. [provider]  Active   diclofenac Sodium (VOLTAREN) 1 % GEL [id number]  Apply 2-4 g topically 4 (four) times daily. Tony Ghent, MD  Active   famotidine (PEPCID) 20 MG tablet [id number] Yes Take 1 tablet (20 mg total) by mouth at bedtime. Parrett, Fonnie Mu, NP Taking Active   glimepiride (AMARYL) 4 MG tablet [id number] Yes Take 1 tablet (4 mg total) by mouth 2 (two) times daily. Tony Ghent, MD Taking Active   glucose  blood (ONETOUCH VERIO) test strip [id number] Yes Use to check blood sugars TID. Dx: E11.9 Tony Ghent, MD Taking Active   ibuprofen (ADVIL) 600 MG tablet [id number] Yes Take 600 mg by mouth every 8 (eight) hours as needed. [provider] Taking Active   Insulin NPH, Human,, Isophane, (NOVOLIN N FLEXPEN) 100 UNIT/ML Mayer Masker [id number] Yes Inject 20 Units into the skin in the morning and at bedtime. Tony Ghent, MD Taking Active   Insulin Pen Needle 32G X 4 MM MISC [id number] Yes Use daily with insulin pen. Tony Ghent, MD Taking Active Self  Insulin Syringe-Needle U-100 (INSULIN SYRINGE 1CC/31GX5/16") 31G X 5/16" 1 ML MISC [id number] Yes Use with NPH insulin twice daily. Tony Ghent, MD Taking Active   isosorbide mononitrate (IMDUR) 30 MG 24 hr tablet [id number] Yes Take 1 tablet (30 mg total) by mouth daily. Manning, Tony C, PA-C Taking Active   Lancets [facility] ULTRASOFT) lancets [id number] Yes Use to check blood sugars TID. Dx: E11.9 Tony Ghent, MD Taking Active   losartan (COZAAR) 100 MG tablet [id number] Yes TAKE 1 TABLET BY MOUTH EVERY DAY Tony Prows, MD Taking Active   metFORMIN (GLUCOPHAGE) 1000 MG tablet [id number] Yes Take 1 tablet (1,000 mg total) by mouth 2 (two) times daily with a meal. Tony Ghent, MD Taking Active   nitroGLYCERIN (NITROSTAT) 0.4 MG SL tablet [id number] Yes Place 1 tablet (0.4 mg total) under the  tongue every 5 (five) minutes as needed for chest pain. Miquel Dunn, NP Taking Active   ondansetron [facility]) 4 MG tablet [id number] Yes Take 4 mg by mouth every 8 (eight) hours as needed. [provider] Taking Active   oxyCODONE (OXY IR/ROXICODONE) 5 MG immediate release tablet [id number] Yes Take 5 mg by mouth every 6 (six) hours as needed. [provider] Taking Active   pantoprazole (PROTONIX) 40 MG tablet [id number] Yes Take 1 tablet (40 mg total) by mouth daily. Tony Ghent, MD Taking Active   tamsulosin [facility] Manning) 0.4 MG CAPS capsule [id number] Yes Take 2 capsules (0.8 mg total) by mouth daily. Tony Ghent, MD Taking Active   tiZANidine (ZANAFLEX) 4 MG tablet [id number] Yes Take 0.5-1 tablets (2-4 mg total) by mouth every 8 (eight) hours as needed for muscle spasms (Sedation caution). Tony Ghent, MD Taking Active           Patient Active Problem List   Diagnosis Date Noted  . Dyspnea 11/12/2020  . Sarcoidosis 11/12/2020  . H/O colonoscopy 10/27/2020  . Nocturia 05/03/2020  . Right sided weakness 03/06/2020  . Low back pain 01/29/2020  . Facial weakness 11/16/2019  . Hematuria 03/22/2018  . Renal stones 03/22/2018  . Vitamin D deficiency, unspecified 10/04/2017  . Chronic neck pain (Primary Area of Pain) (B) (L>R) 09/21/2017  . Chronic upper extremity pain (Secondary Area of Pain) (Bilateral) (L>R) 09/21/2017  . Chronic left shoulder pain (Tertiary Area of Pain) 09/21/2017  . Chronic low back pain (Fourth Area of Pain) (B) (R>L) 09/21/2017  . Chronic pain of lower extremity (B) (R>L) 09/21/2017  . Other long term (current) drug therapy 09/21/2017  . Other specified health status 09/21/2017  . Other chronic pain 08/22/2017  . Other fatigue 05/10/2017  . Radiculopathy 04/07/2017  . Angina pectoris ([place]) 10/15/2016  . Cervical radiculitis 07/23/2016  . Dysphagia 06/12/2016  . Sciatica 02/06/2016  . Plantar fasciitis 01/16/2016  . Right  sided sciatica 05/16/2015  . Hand pain 04/05/2015  . Premature ejaculation 09/17/2014  .  Heel pain 09/17/2014  . NSTEMI (non-ST elevated myocardial infarction) ([place]) 06/13/2014  . Atypical chest pain 06/06/2014  . Memory change 05/24/2014  . Benign paroxysmal positional vertigo 07/13/2013  . Orthostasis 07/13/2013  . Migraine, unspecified, without mention of intractable migraine without mention of status migrainosus 04/03/2013  . Medicare annual wellness visit, initial 02/17/2013  . Pain in joint, ankle and foot 10/19/2012  . Irritable mood 01/18/2012  . Tobacco abuse 12/21/2011  . Neck pain 12/03/2010  . Hearing loss 12/03/2010  . 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 HYPERCHOLESTEROLEMIA 10/20/2010  . DEPRESSION 10/20/2010  . Osteoarthritis 10/20/2010  . ARTHRITIS 10/20/2010  . NEPHROLITHIASIS, HX OF 10/20/2010  . ESSENTIAL HYPERTENSION, BENIGN 09/30/2010  . CORONARY ATHEROSCLEROSIS NATIVE CORONARY ARTERY 09/30/2010  . Sleep apnea 09/30/2010  . CHEST PAIN UNSPECIFIED 09/30/2010  . PERCUTANEOUS TRANSLUMINAL CORONARY ANGIOPLASTY, HX OF 09/30/2010    Immunization History  Administered Date(s) Administered  . Influenza Inj Mdck Quad Pf 06/02/2018  . Influenza Whole 09/17/2010  . Influenza,inj,Quad PF,6+ Mos 06/08/2013, 05/22/2014, 05/12/2016, 05/08/2019, 05/27/2020  . Influenza-Unspecified 06/08/2013, 05/22/2014, 06/08/2015, 05/12/2016, 05/21/2017, 06/02/2018, 05/09/2019  . PFIZER(Purple Top)SARS-COV-2 Vaccination 11/27/2019, 12/25/2019, 08/07/2020  . Pneumococcal Polysaccharide-23 09/08/2007, 02/16/2013  . Td 09/07/2009    Conditions to be addressed/monitored:  Hyperlipidemia and Diabetes  Care Plan : [place]  Updates made by Tony Manning, [facility] Manning since 12/30/2020 12:00 AM    Problem: CHL AMB "PATIENT-SPECIFIC PROBLEM"     Long-Range Goal: Patient Stated   Start Date: 11/27/2020  Priority: High  Note:     Current  Barriers:  . Uncontrolled diabetes  Pharmacist Clinical Goal(s):  [name] Kitchen Patient will adhere to plan to optimize therapeutic regimen for diabetes as evidenced by report of adherence to recommended medication management changes through collaboration with PharmD and provider.   Interventions: . 1:1 collaboration with Tony Ghent, MD regarding development and update of comprehensive plan of care as evidenced by provider attestation and co-signature . Inter-disciplinary care team collaboration (see longitudinal plan of care) . Comprehensive medication review performed; medication list updated in electronic medical record  Hyperlipidemia: (LDL goal < 70) -Controlled - LDL 59 -Current treatment:  Atorvastatin 40 mg - 1 tablet daily (morning) -Medications previously tried: none  -Recommended to continue current medication  Diabetes (A1c goal <7%) -Uncontrolled - A1c 7.3% -Current medications:  Glimepiride 4 mg - 1 tablet twice daily  Insulin NPH Flexpen (Novolin N) - Inject 20 units into skin BID before meals   Metformin 1000 mg - 1 tablet BID with meals -Medications previously tried: none reported -Adherence: self-adjusts Novolin -Current home glucose readings - checking daily BID (Received steroid shot yesterday) -Blood sugar readings are as follows:                Fasting AM         Bedtime 12/20/20 - 537 this morning 3 AM (Dizziness) - He took an addition Novolin 20 units. BG is slowly coming down.  11/19/20  90                  81 11/20/20  111                141 11/21/20  155                165 11/22/20  142                157 11/23/20  83                  -  11/24/20  125                - 11/25/20  202                58 (states he drank orange juice and ate a piece of chocolate to bring it up) 11/26/20  104  Fasting AM    Before evening meal 10/21/20:     197  145 10/22/20:     165             117 10/23/20:      88   186 10/24/20      195  291-(had already eaten before  checking) 10/26/19:     181  119 10/27/19:     102  109 10/28/19:     135  166 10/28/20:     166   191 10/29/20:     182  We discussed: Pt approved for Ozempic - enrollments starts today and ends 08/06/21. Shipped on 12/19/20 - 10-14 business days, estimated arrival at office Jan 09, 2021. Shipped 2 boxes - Ozempic 0.25 mg (4 month supply). -Pt will come pick up in office once it arrives. -Recommended start Ozempic 0.25 mg weekly on Sunday 12/29/20. Reduce Novolin to 16 units twice daily if any hypoglycemia.   Patient Goals/Self-Care Activities . Patient will:  - check glucose twice daily, document, and provide at future appointments   Follow Up Plan: The care management team will reach out to the patient again over the next 30 days.       Medication Assistance: Application for Ozempic  medication assistance program approved through 08/06/2021.  Anticipated assistance start date 12/29/20.  See plan of care for additional detail.  Patient's preferred pharmacy is:  [facility] - [place], [place] - [address] [place] [address] Phone: [phone] Fax: [phone]  Reports adherence packaging is working well for him. Denies missed doses.  Coordinated next delivery for 01/06/21 Packs, 30 DS: Aspirin 81 mg - 1 tablet daily (1 breakfast)  Amlodipine 10 mg- 1 tablet daily (1 breakfast) Atorvastatin 40 mg- 1 tablet daily (1 breakfast) Carvedilol 6.25 mg- 1 tablet twice daily (1 breakfast, 1 evening meal) Famotidine 20 mg- 1 tablet daily (1 bedtime) Glimepiride 4 mg- 1 tablet twice daily (1 breakfast, 1 evening meal) Isosorbide mononitrate 30 mg- 1 tablet daily (1 bedtime) Losartan 100 mg- 1 tablet daily- (1 bedtime) Metformin 1000 mg- 1 tablet twice daily (1 breakfast, 1 evening meal) Pantoprazole 40 mg- 1 tablet daily (1 breakfast) Tamsulosin 0.4 mg- 2 capsule daily (1 breakfast, 1 bedtime)    Care Plan and Follow Up Patient  Decision:  Patient agrees to Care Plan and Follow-up.  Tony Manning, PharmD Clinical Pharmacist [facility] at [facility] [phone]  Encounter details: CCM Time Spent      Value Time User   Time spent with patient (minutes)  80 12/30/2020  2:34 PM Tony Manning, [facility]   Time spent performing Chart review  30 12/30/2020  2:34 PM Tony Manning, [facility]   Total time (minutes)  110 12/30/2020  2:34 PM Tony Manning, RPH     Moderate to High Complex Decision Making      Value Time User   Moderate to High complex decision making  Yes 12/30/2020  2:34 PM Tony Manning, [facility] Manning     CCM Services: This encounter meets complex CCM services and moderate to high decision making.  Prior to  outreach and patient consent for Chronic Care Management, I referred this patient for services after reviewing the nominated patient list or from a personal encounter with the patient.  I have personally reviewed this encounter including the documentation in this note and have collaborated with the care management provider regarding care management and care coordination activities to include development and update of the comprehensive care plan. I am certifying that I agree with the content of this note and encounter as supervising physician.

## 2020-12-24 DIAGNOSIS — M1711 Unilateral primary osteoarthritis, right knee: Secondary | ICD-10-CM | POA: Diagnosis not present

## 2020-12-25 ENCOUNTER — Telehealth: Payer: Self-pay

## 2020-12-25 ENCOUNTER — Other Ambulatory Visit: Payer: Self-pay

## 2020-12-25 MED ORDER — PANTOPRAZOLE SODIUM 40 MG PO TBEC: 1.0000 | DELAYED_RELEASE_TABLET | Freq: Every day | ORAL | 3 refills | Status: DC

## 2020-12-25 MED ORDER — GLIMEPIRIDE 4 MG PO TABS: 4.0000 mg | ORAL_TABLET | Freq: Two times a day (BID) | ORAL | 1 refills | Status: DC

## 2020-12-25 NOTE — Telephone Encounter (Signed)
-----   Message from Debbora Dus, [facility] sent at 12/20/2020  8:42 AM EDT ----- Regarding: Refills Needed Patient requesting refills on glimepiride and pantoprazole to [facility].  Thank you,  Debbora Dus, PharmD Clinical Pharmacist [facility] at [facility] [phone]

## 2020-12-25 NOTE — Telephone Encounter (Signed)
Erxs sent to pharmacy.

## 2020-12-30 NOTE — Patient Instructions (Signed)
Dear [name],  Below is a summary of the goals we discussed during our follow up appointment on December 30, 2020. Please contact me anytime with questions or concerns.   Visit Information  Patient Care Plan: [facility] Pharmacy Care Plan    Problem Identified: [facility] AMB "PATIENT-SPECIFIC PROBLEM"     Long-Range Goal: Patient Stated   Start Date: 11/27/2020  Priority: High  Note:     Current Barriers:  . Uncontrolled diabetes  Pharmacist Clinical Goal(s):  [name] Kitchen Patient will adhere to plan to optimize therapeutic regimen for diabetes as evidenced by report of adherence to recommended medication management changes through collaboration with PharmD and provider.   Interventions: . 1:1 collaboration with Tonia Ghent, MD regarding development and update of comprehensive plan of care as evidenced by provider attestation and co-signature . Inter-disciplinary care team collaboration (see longitudinal plan of care) . Comprehensive medication review performed; medication list updated in electronic medical record  Hyperlipidemia: (LDL goal < 70) -Controlled - LDL 59 -Current treatment:  Atorvastatin 40 mg - 1 tablet daily (morning) -Medications previously tried: none  -Recommended to continue current medication  Diabetes (A1c goal <7%) -Uncontrolled - A1c 7.3% -Current medications:  Glimepiride 4 mg - 1 tablet twice daily  Insulin NPH Flexpen (Novolin N) - Inject 20 units into skin BID before meals   Metformin 1000 mg - 1 tablet BID with meals -Medications previously tried: none reported -Adherence: self-adjusts Novolin -Current home glucose readings - checking daily BID (Received steroid shot yesterday) -Blood sugar readings are as follows:                Fasting AM         Bedtime 12/20/20 - 537 this morning 3 AM (Dizziness) - He took an addition Novolin 20 units. BG is slowly coming down.  11/19/20  90                  81 11/20/20  111                141 11/21/20  155                 165 11/22/20  142                157 11/23/20  83                  - 11/24/20  125                - 11/25/20  202                58 (states he drank orange juice and ate a piece of chocolate to bring it up) 11/26/20  104  Fasting AM    Before evening meal 10/21/20:     197  145 10/22/20:     165             117 10/23/20:      88   186 10/24/20      195  291-(had already eaten before checking) 10/26/19:     181  119 10/27/19:     102  109 10/28/19:     135  166 10/28/20:     166   191 10/29/20:     182  We discussed: Pt approved for Ozempic - enrollments starts today and ends 08/06/21. Shipped on 12/19/20 - 10-14 business days, estimated arrival at office Jan 09, 2021. Shipped 2 boxes -  Ozempic 0.25 mg (4 month supply). -Pt will come pick up in office once it arrives. -Recommended start Ozempic 0.25 mg weekly on Sunday 12/29/20. Reduce Novolin to 16 units twice daily if any hypoglycemia.   Patient Goals/Self-Care Activities . Patient will:  - check glucose twice daily, document, and provide at future appointments   Follow Up Plan: The care management team will reach out to the patient again over the next 30 days.       The patient verbalized understanding of instructions, educational materials, and care plan provided today and agreed to receive a mailed copy of patient instructions, educational materials, and care plan.   Debbora Dus, PharmD Clinical Pharmacist [facility] at [facility] [phone]   Diabetes Mellitus and Sick Day Management Blood sugar (glucose) can be difficult to control when you are sick. Common illnesses that can cause problems for people with diabetes (diabetes mellitus) include colds, fever, flu (influenza), nausea, vomiting, and diarrhea. These illnesses can cause stress and loss of body fluids (dehydration), and those issues can cause blood glucose levels to increase. Because of this, it is very important to take your insulin and diabetes medicines  and eat some form of carbohydrate when you are sick. You should make a plan for days when you are sick (sick day plan) as part of your diabetes management plan. You and your health care provider should make this plan in advance. The following guidelines are intended to help you manage an illness that lasts for about 24 hours or less. Your health care provider may also give you more specific instructions. How to manage your blood glucose  Check your blood glucose every 2-4 hours, or as often as told by your health care provider.  If you use insulin, take your usual dose. If your blood glucose continues to be too high, you may need to take an additional insulin dose as told by your health care provider.  Know your sick day treatment goals. Your target blood glucose levels may be different when you are sick.  If you use oral diabetes medicine, continue to take your medicines. Have a plan with your health care provider for these medicines while you are sick.  If you use injectable hormone medicines other than insulin to control your diabetes, have a plan with your health care provider for these medicines while you are sick.   Follow these instructions at home Check your ketones  If you have type 1 diabetes, check your urine ketones every 4 hours.  If you have type 2 diabetes, check your urine ketones as often as told by your health care provider. Eating and drinking  Drink enough fluid to keep your urine pale yellow. This is especially important if you have a fever, vomiting, or diarrhea. Those symptoms can lead to dehydration.  Follow instructions from your health care provider about beverages to avoid.  Do not drink alcohol, caffeine, or drinks that contain a lot of sugar.  You need to eat some form of carbohydrates when you are sick. Eat 45-50 grams (45-50 g) of carbohydrates every 3-4 hours until you feel better. All of the food choices below contain about 15 g of carbohydrates. Plan ahead  and keep some of these foods around so you have them if you get sick. ? 4-6 oz (120-177 mL) carbonated beverage that contains sugar, such as regular (not diet) soda. You may be able to drink carbonated beverages more easily if you open the beverage and let it sit  at room temperature for a few minutes before drinking. ?  of a twin frozen ice pop. ? 4 oz (120 g) regular gelatin. ? 4 oz (120 mL) fruit juice. ? 4 oz (120 g) ice cream or frozen yogurt. ? 2 oz (60 g) sherbet. ? 1 slice bread or toast. ? 6 saltine crackers. ? 5 vanilla wafers. Medicines  Take-over-the-counter and prescription medicines only as told by your health care provider.  Check medicine labels for added sugars. Some medicines may contain sugar or types of sugars that can raise your blood glucose level. Questions to ask your health care provider  Should I adjust my diabetes medicines?  How often do I need to check my blood glucose?  What supplies do I need to manage my diabetes at home when I am sick?  What number can I call if I have questions?  What foods and drinks should I avoid? Contact a health care provider if:  You have been sick or have had a fever for 2 days or longer and you are not getting better.  Your blood glucose is at or above 240 mg/dl (13.3 mmol/L), even after you take an additional insulin dose.  You are unable to drink fluids without vomiting.  You have any of the following for more than 6 hours: ? Nausea. ? Vomiting. ? Diarrhea. Get help right away if:  You have difficulty breathing.  You have moderate or high ketone levels in your urine.  You have a change in how you think, feel, or act (mental status).  You develop symptoms of diabetic ketoacidosis. These include: ? Nausea. ? Vomiting. ? Excessive thirst. ? Excessive urination. ? Fruity or sweet smelling breath. ? Rapid breathing. ? Pain in the abdomen.  Your blood glucose is lower than 54mg /dl (3.0 mmol/L).  You used  emergency glucagon to treat low blood glucose. These symptoms may represent a serious problem that is an emergency. Do not wait to see if the symptoms will go away. Get medical help right away. Call your local emergency services (911 in the [place] Do not drive yourself to the hospital. Summary  Blood sugar (glucose) can be difficult to control when you are sick. Common illnesses that can cause problems for people with diabetes (diabetes mellitus) include colds, fever, flu (influenza), nausea, vomiting, and diarrhea.  Illnesses can cause stress and loss of body fluids (dehydration), and those issues can cause blood glucose levels to increase.  Make a plan for days when you are sick (sick day plan) as part of your diabetes management plan. You and your health care provider should make this plan in advance.  It is very important to take your insulin and diabetes medicines and to eat some form of carbohydrate when you are sick.  Contact your health care provider if have problems managing your blood glucose levels when you are sick, or if you have been sick or had a fever for 2 days or longer and are not getting better. This information is not intended to replace advice given to you by your health care provider. Make sure you discuss any questions you have with your health care provider. Document Revised: 09/14/2019 Document Reviewed: 09/14/2019 Elsevier Patient Education  2021 [employer].

## 2021-01-03 ENCOUNTER — Encounter: Payer: Self-pay | Admitting: Family Medicine

## 2021-01-03 ENCOUNTER — Ambulatory Visit (INDEPENDENT_AMBULATORY_CARE_PROVIDER_SITE_OTHER): Payer: Medicare HMO | Admitting: Family Medicine

## 2021-01-03 ENCOUNTER — Other Ambulatory Visit: Payer: Self-pay

## 2021-01-03 DIAGNOSIS — R109 Unspecified abdominal pain: Secondary | ICD-10-CM

## 2021-01-03 MED ORDER — OZEMPIC (0.25 OR 0.5 MG/DOSE) 2 MG/1.5ML ~~LOC~~ SOPN: 0.2500 mg | PEN_INJECTOR | SUBCUTANEOUS | Status: DC

## 2021-01-03 MED ORDER — METFORMIN HCL 1000 MG PO TABS: 500.0000 mg | ORAL_TABLET | Freq: Two times a day (BID) | ORAL | Status: DC

## 2021-01-03 MED ORDER — NOVOLIN N FLEXPEN 100 UNIT/ML ~~LOC~~ SUPN: 16.0000 [IU] | PEN_INJECTOR | Freq: Two times a day (BID) | SUBCUTANEOUS | 11 refills | Status: DC

## 2021-01-03 NOTE — Patient Instructions (Addendum)
Keep taking ozempic and if your AM sugar is usually above 200 then increase insulin back to 18 units twice a day.  Update Debbora Dus about your sugar in about 2 weeks.    Cut the metformin back to 1/2 tab twice a day and update me in a few days.   We may need to check an ultrasound or a gastric emptying test.  Take care.  Glad to see you.

## 2021-01-03 NOTE — Progress Notes (Signed)
This visit occurred during the SARS-CoV-2 public health emergency.  Safety protocols were in place, including screening questions prior to the visit, additional usage of staff PPE, and extensive cleaning of exam room while observing appropriate contact time as indicated for disinfecting solutions.  1st dose of 0.25mg  ozempic done 5 days ago.  Dec NPH down to 16 units BID in the meantime.  Sugar has been up this week, up to 220 recently.  Usually had been ~150 prior to dec in insulin.  occ above 200 in the AM.  This AM was 154.   He had stress test per cards, d/w pt.  I'll defer.    He has some upper abd pain.  More gas recently, over the last month.  Indigestion and bloating.  Predates ozempic use.  Discomfort with eating.  occ twinge near the RUQ.  No blood in stool.  No black stools.  Some nausea w/o vomiting.  Dec in appetite with episodes.  Sore constantly, but sometimes more than others.  No lower abd pain.  He had GI f/u pending.  Bloating may be worse with ozempic, but sx predate use.    Hiccups noted recently, episodically.  Predates ozempic.  No jaundice.    Meds, vitals, and allergies reviewed.   ROS: Per HPI unless specifically indicated in ROS section   nad ncat Neck supple, no LA rrr ctab abd soft, not ttp in lower abd.  LUQ slightly ttp, no rebound.  RUQ not ttp.   Normal BS Extremities well perfused.  No jaundice.

## 2021-01-05 DIAGNOSIS — R109 Unspecified abdominal pain: Secondary | ICD-10-CM | POA: Insufficient documentation

## 2021-01-05 NOTE — Assessment & Plan Note (Signed)
Unclear source.  Broad differential discussed with patient Gastroparesis could be possible. Discussed biliary symptoms but he is not tender in the right upper quadrant on exam. Symptoms could be related to metformin Symptoms could be exacerbated by Ozempic, but they clearly predate Ozempic start.   At this point, would keep taking ozempic and if AM sugar is usually above 200 then increase insulin back to 18 units twice a day.  I asked him to update Debbora Dus about his sugar in about 2 weeks.    Cut the metformin back to 1/2 tab twice a day and update me in a few days.  We will see how much that changes his symptoms. We may need to check an ultrasound or a gastric emptying test, but I want to hear how he is doing on the lower dose of metformin in the meantime.

## 2021-01-13 DIAGNOSIS — E113293 Type 2 diabetes mellitus with mild nonproliferative diabetic retinopathy without macular edema, bilateral: Secondary | ICD-10-CM | POA: Diagnosis not present

## 2021-01-13 DIAGNOSIS — H5203 Hypermetropia, bilateral: Secondary | ICD-10-CM | POA: Diagnosis not present

## 2021-01-13 DIAGNOSIS — H25041 Posterior subcapsular polar age-related cataract, right eye: Secondary | ICD-10-CM | POA: Diagnosis not present

## 2021-01-13 DIAGNOSIS — Z0100 Encounter for examination of eyes and vision without abnormal findings: Secondary | ICD-10-CM | POA: Diagnosis not present

## 2021-01-13 DIAGNOSIS — H35013 Changes in retinal vascular appearance, bilateral: Secondary | ICD-10-CM | POA: Diagnosis not present

## 2021-01-13 DIAGNOSIS — H524 Presbyopia: Secondary | ICD-10-CM | POA: Diagnosis not present

## 2021-01-13 DIAGNOSIS — H52223 Regular astigmatism, bilateral: Secondary | ICD-10-CM | POA: Diagnosis not present

## 2021-01-13 LAB — HM DIABETES EYE EXAM

## 2021-01-14 ENCOUNTER — Telehealth: Payer: Self-pay

## 2021-01-14 DIAGNOSIS — R109 Unspecified abdominal pain: Secondary | ICD-10-CM

## 2021-01-14 NOTE — Telephone Encounter (Signed)
Pt left v/m; pt was seen 01/03/21 and pt was to cb with update; in v/m pt has still having problems. Unable to reach pt by phone and left v/m requesting cb to get additional information.

## 2021-01-14 NOTE — Telephone Encounter (Signed)
I spoke with pt; pt said since reducing the metformin to taking 500 mg bid pt not having as much gas or indigestion; pt still has some gas but not as much. Pt continues with pain below ribs on rt side; now pain level is 2 - 3 but when has short episode of increased pain for few seconds the pain level goes to 10. Pt is not having fever and no vomiting but occasionally has nausea. [facility]. At last visit on 01/03/21 mentioned may need to do some testing such as [place] or gastric emptying. After reviewed by DR [name] pt request cb.

## 2021-01-15 ENCOUNTER — Encounter: Payer: Medicare HMO | Admitting: Internal Medicine

## 2021-01-15 NOTE — Telephone Encounter (Signed)
LMTCB

## 2021-01-15 NOTE — Telephone Encounter (Signed)
If having episodic right-sided abdominal discomfort, would avoid fatty meals, continue the lower dose of metformin, and set up abdominal ultrasound.  I put in the order for the ultrasound so he should get a call to schedule.  Thanks.

## 2021-01-16 NOTE — Telephone Encounter (Signed)
Noted. Nothing further needed. 

## 2021-01-16 NOTE — Telephone Encounter (Signed)
Patient advised. Patient agreed to the Ultrasound in [place].

## 2021-01-20 ENCOUNTER — Telehealth: Payer: Self-pay

## 2021-01-20 NOTE — Telephone Encounter (Addendum)
Pt reports elevated BG lately. He has not noticed much improvement on Ozempic 0.25 mg weekly x past 4 weeks. His metformin was cut back to 1/2 tablet (1000 mg/day) at last PCP visit 01/03/21 due to abdominal discomfort. We discussed the metformin dose decrease and low dose of Ozempic likely reason for elevated BG. He continues his insulin 16-20 units BID depending on his BG level.   Tolerance - Ongoing stomach pain prior to Ozempic. It is unclear if Ozempic or metformin contributing. He denies much improvement on lower dose metformin for past 2 weeks. He has a abdominal ultrasound ordered.  Reports increase in appetite lately and weight gain despite Ozempic. Weight 216 lbs this morning.   Current diabetes medications: Metformin 1000 mg - 1/2 tablet (500 mg) twice daily  Ozempic 0.25 mg - Inject once weekly  Novolin N Flexpen - Takes 16 units if BG < 200 and 20 units if BG > 200  Plan:  --Increase Ozempic to 0.5 mg weekly (Sundays) Sharyn Lull to call new dose into PAP program, if PCP approves) --Follow up in 1 month for BG log review --Call if any worsening in abdominal pain  Debbora Dus, PharmD Clinical Pharmacist [facility] at [facility] [phone]

## 2021-01-20 NOTE — Telephone Encounter (Signed)
Thanks.  I agree with your plan- inc in ozempic, continue metformin as is, and get the u/s done as scheduled.  Please send in the request for the higher dose of ozempic.

## 2021-01-21 NOTE — Telephone Encounter (Signed)
Novo Nordisk requires paper form faxed and signed for dose changes. Asked Ria Comment to complete and send back to me for review/signature.   Debbora Dus, PharmD Clinical Pharmacist [facility] at [facility] [phone]

## 2021-01-22 ENCOUNTER — Telehealth: Payer: Self-pay | Admitting: Adult Health

## 2021-01-22 NOTE — Telephone Encounter (Addendum)
When I called the patient back and told him he would have to get the Covid test before doing the PFT. He stated no he wasn't going to do the Covid test.  So we can't schedule his PFT

## 2021-01-22 NOTE — Chronic Care Management (AMB) (Signed)
Completed NovoNordisk dose change form and uploaded to Debbora Dus for provider signature.    Follow-Up:  Patient Assistance Coordination  Debbora Dus, CPP notified  Margaretmary Dys, [place] [facility] Assistant [phone]

## 2021-01-22 NOTE — Addendum Note (Signed)
Addended by: Claudette Head A on: 01/22/2021 03:11 PM   Modules accepted: Orders

## 2021-01-22 NOTE — Telephone Encounter (Signed)
Patient is calling to schedule PFT. He is willing to travel to [facility] if needed.  Rodena Piety, please advise. Thanks

## 2021-01-23 NOTE — Telephone Encounter (Signed)
Has to have Covid test prior to PFT

## 2021-01-23 NOTE — Telephone Encounter (Signed)
Will forward to Rexene Edison, NP, to inform.

## 2021-01-23 NOTE — Telephone Encounter (Signed)
ATC patient, per DPR left detailed message letting him know that it is required for him to get covid test 3 days before his PFT. If he was not wanting the covid test done then we would not be able to schedule him for PFT right now. Advised patient if he would like to schedule covid test and PFT for him to call the office back. Nothing further needed at this time.

## 2021-01-24 ENCOUNTER — Ambulatory Visit
Admission: RE | Admit: 2021-01-24 | Discharge: 2021-01-24 | Disposition: A | Discharge status: Home / Self Care | Payer: Medicare HMO | Source: Ambulatory Visit | Attending: Family Medicine | Admitting: Family Medicine

## 2021-01-24 ENCOUNTER — Ambulatory Visit: Payer: Medicare HMO | Admitting: Family Medicine

## 2021-01-24 DIAGNOSIS — G4733 Obstructive sleep apnea (adult) (pediatric): Secondary | ICD-10-CM | POA: Diagnosis not present

## 2021-01-24 DIAGNOSIS — R109 Unspecified abdominal pain: Secondary | ICD-10-CM

## 2021-01-24 DIAGNOSIS — K760 Fatty (change of) liver, not elsewhere classified: Secondary | ICD-10-CM | POA: Diagnosis not present

## 2021-01-27 ENCOUNTER — Ambulatory Visit (INDEPENDENT_AMBULATORY_CARE_PROVIDER_SITE_OTHER): Payer: Medicare HMO | Admitting: Family Medicine

## 2021-01-27 ENCOUNTER — Encounter: Payer: Self-pay | Admitting: Family Medicine

## 2021-01-27 ENCOUNTER — Telehealth: Payer: Self-pay

## 2021-01-27 ENCOUNTER — Other Ambulatory Visit: Payer: Self-pay

## 2021-01-27 VITALS — BP 124/80 | HR 62 | Temp 97.6°F | Ht 70.0 in | Wt 214.0 lb

## 2021-01-27 DIAGNOSIS — Z794 Long term (current) use of insulin: Secondary | ICD-10-CM

## 2021-01-27 DIAGNOSIS — E1149 Type 2 diabetes mellitus with other diabetic neurological complication: Secondary | ICD-10-CM | POA: Diagnosis not present

## 2021-01-27 DIAGNOSIS — R5383 Other fatigue: Secondary | ICD-10-CM

## 2021-01-27 DIAGNOSIS — R109 Unspecified abdominal pain: Secondary | ICD-10-CM | POA: Diagnosis not present

## 2021-01-27 LAB — CBC WITH DIFFERENTIAL/PLATELET
Basophils Absolute: 0 10*3/uL (ref 0.0–0.1)
Basophils Relative: 0.6 % (ref 0.0–3.0)
Eosinophils Absolute: 0.2 10*3/uL (ref 0.0–0.7)
Eosinophils Relative: 2.3 % (ref 0.0–5.0)
HCT: 40.6 % (ref 39.0–52.0)
Hemoglobin: 14 g/dL (ref 13.0–17.0)
Lymphocytes Relative: 17.8 % (ref 12.0–46.0)
Lymphs Abs: 1.4 10*3/uL (ref 0.7–4.0)
MCHC: 34.6 g/dL (ref 30.0–36.0)
MCV: 89.6 fl (ref 78.0–100.0)
Monocytes Absolute: 0.6 10*3/uL (ref 0.1–1.0)
Monocytes Relative: 8.1 % (ref 3.0–12.0)
Neutro Abs: 5.6 10*3/uL (ref 1.4–7.7)
Neutrophils Relative %: 71.2 % (ref 43.0–77.0)
Platelets: 223 10*3/uL (ref 150.0–400.0)
RBC: 4.53 Mil/uL (ref 4.22–5.81)
RDW: 13.7 % (ref 11.5–15.5)
WBC: 7.9 10*3/uL (ref 4.0–10.5)

## 2021-01-27 LAB — HEPATIC FUNCTION PANEL
ALT: 21 U/L (ref 0–53)
AST: 20 U/L (ref 0–37)
Albumin: 4.8 g/dL (ref 3.5–5.2)
Alkaline Phosphatase: 78 U/L (ref 39–117)
Bilirubin, Direct: 0.1 mg/dL (ref 0.0–0.3)
Total Bilirubin: 0.6 mg/dL (ref 0.2–1.2)
Total Protein: 7.5 g/dL (ref 6.0–8.3)

## 2021-01-27 LAB — BASIC METABOLIC PANEL
BUN: 24 mg/dL — ABNORMAL HIGH (ref 6–23)
CO2: 25 mEq/L (ref 19–32)
Calcium: 10.1 mg/dL (ref 8.4–10.5)
Chloride: 106 mEq/L (ref 96–112)
Creatinine, Ser: 1.66 mg/dL — ABNORMAL HIGH (ref 0.40–1.50)
GFR: 44.41 mL/min — ABNORMAL LOW (ref >60.00)
Glucose, Bld: 157 mg/dL — ABNORMAL HIGH (ref 70–99)
Potassium: 4.8 mEq/L (ref 3.5–5.1)
Sodium: 139 mEq/L (ref 135–145)

## 2021-01-27 LAB — IRON: Iron: 91 ug/dL (ref 42–165)

## 2021-01-27 LAB — VITAMIN B12: Vitamin B-12: 187 pg/mL — ABNORMAL LOW (ref 211–911)

## 2021-01-27 LAB — HEMOGLOBIN A1C: Hgb A1c MFr Bld: 7.5 % — ABNORMAL HIGH (ref 4.6–6.5)

## 2021-01-27 LAB — LIPASE: Lipase: 25 U/L (ref 11.0–59.0)

## 2021-01-27 LAB — TSH: TSH: 1.34 u[IU]/mL (ref 0.35–4.50)

## 2021-01-27 MED ORDER — OZEMPIC (0.25 OR 0.5 MG/DOSE) 2 MG/1.5ML ~~LOC~~ SOPN: 0.5000 mg | PEN_INJECTOR | SUBCUTANEOUS | Status: DC

## 2021-01-27 NOTE — Chronic Care Management (AMB) (Signed)
Completed [employer] order form. Uploaded to Debbora Dus for review and coordination of PCP signature.

## 2021-01-27 NOTE — Patient Instructions (Signed)
Go to the lab on the way out.   If you have mychart we'll likely use that to update you.    Don't change your meds yet.   Take care.  Glad to see you. I'll update the GI clinic.

## 2021-01-27 NOTE — Progress Notes (Signed)
This visit occurred during the SARS-CoV-2 public health emergency.  Safety protocols were in place, including screening questions prior to the visit, additional usage of staff PPE, and extensive cleaning of exam room while observing appropriate contact time as indicated for disinfecting solutions.  Recent u/s d/w pt.  No gallstones. He does have fatty deposits in the liver and the treatment is to control diabetes.  He is still having occ RUQ pain, noted for a few hours last night, "like it would expand and then contract" locally.  We talked about HIDA vs GI eval. I need input from the GI clinic and I will route this message.  Fatigue noted, worse in the last few weeks.  He ate steak last night and felt better this AM.  He hadn't been eating much meat in the last few weeks, not intentionally limiting meat prev but busy and didn't feel like cooking much.    Diabetes Sugar has been down to 56 when out in the yard working (witih sx but not prolonged prodrome) but usually ~150+/- 30.  Follow-up labs pending.  See notes on A1c.  He just started ozempic 0.5mg  dosing.  He is going to dec his insulin if sugar is decreasing, d/w pt.  Currently on 16 units BID.    Meds, vitals, and allergies reviewed.   ROS: Per HPI unless specifically indicated in ROS section   GEN: nad, alert and oriented HEENT: ncat NECK: supple w/o LA CV: rrr.  PULM: ctab, no inc wob ABD: soft, +bs, upper abd sore but not sig ttp, no rebound.   EXT: no edema SKIN: no acute rash

## 2021-01-27 NOTE — Telephone Encounter (Signed)
Patient called to inquire about CGM. He states he has a hard time drawing blood from his finger and due to busy schedule only checks BG twice daily. He is concerned about low blood glucose and would like to be able to check more often. Will have Ria Comment complete DME order form for freestyle libre. Once complete, I will review and coordinate PCP signature and fax to DME.  Debbora Dus, PharmD Clinical Pharmacist [facility] at [facility] [phone]

## 2021-01-27 NOTE — Chronic Care Management (AMB) (Addendum)
Chronic Care Management Pharmacy Assistant   Name: Tony Manning  MRN: [id number] DOB: March 17, 1961  Reason for Encounter: Medication Adherence and Delivery Coordination  Recent office visits:  01/03/21- Dr. [name]- Started Ozempic 0.25 weekly - Increased to 0.5 mg weekly. Decreased Novolin from 20 units to 16 units BID. Decreased Metformin from 1000 mg BID to 500 mg BID.   Recent consult visits:  None since last CCM contact.   Hospital visits:  None in previous 6 months  Medications: Outpatient Encounter Medications as of 01/27/2021  Medication Sig   amitriptyline (ELAVIL) 10 MG tablet Take 1 tablet (10 mg total) by mouth at bedtime as needed for sleep.   amLODipine (NORVASC) 10 MG tablet Take 1 tablet (10 mg total) by mouth daily.   aspirin 81 MG chewable tablet Chew 81 mg by mouth at bedtime.   atorvastatin (LIPITOR) 40 MG tablet Take 1 tablet (40 mg total) by mouth daily.   Blood Glucose Monitoring Suppl (ONETOUCH VERIO) w/Device KIT Use to check blood sugars TID. Dx: E11.9   carvedilol (COREG) 6.25 MG tablet TAKE 1 TABLET BY MOUTH TWICE A DAY   cyclobenzaprine (FLEXERIL) 5 MG tablet Take 5 mg by mouth 2 (two) times daily.   diclofenac Sodium (VOLTAREN) 1 % GEL Apply 2-4 g topically 4 (four) times daily.   famotidine (PEPCID) 20 MG tablet Take 1 tablet (20 mg total) by mouth at bedtime.   glimepiride (AMARYL) 4 MG tablet Take 1 tablet (4 mg total) by mouth 2 (two) times daily.   glucose blood (ONETOUCH VERIO) test strip Use to check blood sugars TID. Dx: E11.9   Insulin NPH, Human,, Isophane, (NOVOLIN N FLEXPEN) 100 UNIT/ML Kiwkpen Inject 16 Units into the skin in the morning and at bedtime.   Insulin Pen Needle 32G X 4 MM MISC Use daily with insulin pen.   Insulin Syringe-Needle U-100 (INSULIN SYRINGE 1CC/31GX5/16") 31G X 5/16" 1 ML MISC Use with NPH insulin twice daily.   isosorbide mononitrate (IMDUR) 30 MG 24 hr tablet Take 1 tablet (30 mg total) by mouth daily.    Lancets (ONETOUCH ULTRASOFT) lancets Use to check blood sugars TID. Dx: E11.9   losartan (COZAAR) 100 MG tablet TAKE 1 TABLET BY MOUTH EVERY DAY   metFORMIN (GLUCOPHAGE) 1000 MG tablet Take 0.5 tablets (500 mg total) by mouth 2 (two) times daily with a meal.   nitroGLYCERIN (NITROSTAT) 0.4 MG SL tablet Place 1 tablet (0.4 mg total) under the tongue every 5 (five) minutes as needed for chest pain.   ondansetron (ZOFRAN) 4 MG tablet Take 4 mg by mouth every 8 (eight) hours as needed.   oxyCODONE (OXY IR/ROXICODONE) 5 MG immediate release tablet Take 5 mg by mouth every 6 (six) hours as needed.   pantoprazole (PROTONIX) 40 MG tablet Take 1 tablet (40 mg total) by mouth daily.   Semaglutide,0.25 or 0.5MG /DOS, (OZEMPIC, 0.25 OR 0.5 MG/DOSE,) 2 MG/1.5ML SOPN Inject 0.5 mg into the skin once a week.   tamsulosin (FLOMAX) 0.4 MG CAPS capsule Take 2 capsules (0.8 mg total) by mouth daily.   tiZANidine (ZANAFLEX) 4 MG tablet Take 0.5-1 tablets (2-4 mg total) by mouth every 8 (eight) hours as needed for muscle spasms (Sedation caution).   No facility-administered encounter medications on file as of 01/27/2021.   BP Readings from Last 3 Encounters:  01/27/21 124/80  01/03/21 104/64  11/12/20 126/72    Lab Results  Component Value Date   HGBA1C 7.3 (A) 10/25/2020  Recent OV, Consult or Hospital visit: 01/03/21 Dr. [name].  Recent medication changes indicated:  Started Ozempic 0.25 weekly. Decreased Novolin from 20 units to 16 units BID. Decreased Metformin from 1000 mg BID to 500 mg BID  Last adherence delivery date: 01/02/21  30 DS Packs  Patient is due for next adherence delivery on: 02/05/21  Spoke with patient on 01/27/21 and reviewed medications and coordinated delivery.  This delivery to include: Adherence Packaging  30 Days  Packs: Aspirin 81mg - 1 tablet daily (breakfast) Pantoprazole 40 mg- 1 tablet daily (1 breakfast) Amlodipine 10mg - 1 tablet daily (1 breakfast) Famotidine 20mg - 1  tablet daily (1 bedtime) Losartan 100mg - 1 tablet daily- (1 bedtime) Isosorbide 30mg - 1 tablet daily (1 bedtime) Carvedilol 6.25mg - 1 tablet twice daily (1 breakfast, 1 evening meal) Atorvastatin 40mg - 1 tablet daily (1 breakfast) Glimepiride 4mg - 1 tablet twice daily (1 breakfast, 1 evening meal) Tamsulosin 0.4mg  2 capsule daily (1 breakfast, 1 bedtime)   Metformin 1000mg -1/2 tablet twice daily (1/2 breakfast, 1/2 evening meal)  PRN/VIAL medications: Novolin N Flexpen 100 unit/mL (3 mL)- Inject 16 units twice daily  Patient declined the following medications this month: Ozempic 0.5 mg weekly - gets through patient assistance (took 1st dose of 0.5 mg this Sunday)  No refill request needed from PCP.  Confirmed delivery date of 02/05/21, advised patient that pharmacy will contact him the morning of delivery.  Recent blood pressure readings are as follows: No recent readings available   Recent blood glucose readings are as follows: Fasting: ranges from 150-199  Patient discussed price of insulin - he would like to apply for patient assistance for this. Application completed and sent to Dr. Josefine Class clinic to mail to patient as requested.   Follow-Up:  Comptroller and Pharmacist Review  Debbora Dus, CPP notified  Margaretmary Dys, [place] Pharmacy Assistant [phone]

## 2021-01-29 DIAGNOSIS — R5383 Other fatigue: Secondary | ICD-10-CM | POA: Insufficient documentation

## 2021-01-29 NOTE — Assessment & Plan Note (Signed)
Unclear source.  Reasonable to check routine labs today.  See notes on labs.

## 2021-01-29 NOTE — Assessment & Plan Note (Signed)
Currently taking 16 units of insulin twice a day.  He just started Ozempic 0.5 mg.  He will decrease his insulin if his sugar continues to run low.  Recheck periodically.  He agrees with plan.

## 2021-01-29 NOTE — Assessment & Plan Note (Signed)
He is still having occ RUQ pain, noted for a few hours last night, "like it would expand and then contract" locally.  We talked about HIDA vs GI eval. I need input from the GI clinic and I will route this message.  At this point still okay for outpatient follow-up.  He agrees with plan.

## 2021-01-30 ENCOUNTER — Encounter: Payer: Self-pay | Admitting: Family Medicine

## 2021-01-30 ENCOUNTER — Other Ambulatory Visit: Payer: Self-pay | Admitting: Family Medicine

## 2021-01-30 DIAGNOSIS — E538 Deficiency of other specified B group vitamins: Secondary | ICD-10-CM | POA: Insufficient documentation

## 2021-01-30 MED ORDER — CYANOCOBALAMIN 1000 MCG/ML IJ SOLN: 1000.0000 ug | INTRAMUSCULAR | Status: DC

## 2021-01-30 NOTE — Telephone Encounter (Signed)
Order form for [facility] along with last 2 PCP OV notes has been signed and approved by Dr. [name]. Faxed to [employer] on 01/30/21.  Debbora Dus, PharmD Clinical Pharmacist [place] [facility] at [facility] [phone]

## 2021-01-31 ENCOUNTER — Telehealth: Payer: Self-pay

## 2021-01-31 MED ORDER — METFORMIN HCL 1000 MG PO TABS: 500.0000 mg | ORAL_TABLET | Freq: Two times a day (BID) | ORAL | 2 refills | Status: DC

## 2021-01-31 NOTE — Telephone Encounter (Signed)
Need new rx to [facility] reflecting current metformin dose 1000 mg - 1/2 tablet BID. Thanks!  Debbora Dus, PharmD Clinical Pharmacist [place] [facility] at [facility] [phone]

## 2021-01-31 NOTE — Telephone Encounter (Signed)
Rx sent 

## 2021-02-04 ENCOUNTER — Ambulatory Visit (INDEPENDENT_AMBULATORY_CARE_PROVIDER_SITE_OTHER): Payer: Medicare HMO

## 2021-02-04 ENCOUNTER — Other Ambulatory Visit: Payer: Self-pay

## 2021-02-04 DIAGNOSIS — E538 Deficiency of other specified B group vitamins: Secondary | ICD-10-CM

## 2021-02-04 MED ORDER — CYANOCOBALAMIN 1000 MCG/ML IJ SOLN
1000.0000 ug | Freq: Once | INTRAMUSCULAR | Status: AC
  Administered 2021-02-04: 1000 ug via INTRAMUSCULAR

## 2021-02-04 NOTE — Progress Notes (Signed)
Patient presented for B 12 injection given by Braelin Brosch, CMA to right deltoid, patient voiced no concerns nor showed any signs of distress during injection.  

## 2021-02-06 ENCOUNTER — Telehealth: Payer: Self-pay

## 2021-02-06 NOTE — Telephone Encounter (Signed)
Patient called back and discussed his B12; patient still very fatigued. Patients next injection is 03/06/21 and will wait and see how he feels then.

## 2021-02-06 NOTE — Telephone Encounter (Signed)
Pt left v/m that had first B 12 injection on 02/04/21 and pt said he feels no different(tired) than before taking the B 12 shot. Pt wants to know how long will he have to take the B12 injections before can feel better.

## 2021-02-06 NOTE — Telephone Encounter (Signed)
Unfortunately, the effect isn't immediate and it make take a few shots before he notes a change.

## 2021-02-06 NOTE — Telephone Encounter (Signed)
LMTCB

## 2021-02-08 ENCOUNTER — Telehealth: Payer: Self-pay | Admitting: Family Medicine

## 2021-02-08 NOTE — Telephone Encounter (Signed)
Pt currently scheduled for awv phone call and labs. Please reach out to patient to schedule 2nd part of annual visit with Dr. [name].

## 2021-02-13 ENCOUNTER — Telehealth: Payer: Self-pay

## 2021-02-13 NOTE — Telephone Encounter (Signed)
Letter of medical necessity for CGM printed for PCP signature. Placed in PCP's folder in front office. Please return to Tony Manning's folder once signed.  Contacted patient to let him know the letter was needed and he will be updated regarding approval/denial.   Debbora Dus, PharmD Clinical Pharmacist [place] [facility] at [facility] [phone]

## 2021-02-14 NOTE — Telephone Encounter (Signed)
Paperwork faxed as requested

## 2021-02-24 ENCOUNTER — Other Ambulatory Visit: Payer: Self-pay | Admitting: Cardiology

## 2021-02-25 ENCOUNTER — Telehealth: Payer: Self-pay

## 2021-02-25 ENCOUNTER — Telehealth: Payer: Self-pay | Admitting: *Deleted

## 2021-02-25 DIAGNOSIS — N200 Calculus of kidney: Secondary | ICD-10-CM | POA: Diagnosis not present

## 2021-02-25 NOTE — Telephone Encounter (Signed)
Patient left a voicemail requesting a call back. Tried to call patient back and got his voicemail. Left another message for patient to call the office back.

## 2021-02-25 NOTE — Chronic Care Management (AMB) (Addendum)
Chronic Care Management Pharmacy Assistant   Name: Tony Manning  MRN: [id number] DOB: [birth year]  Reason for Encounter: Medication Adherence and Delivery Coordination   Recent office visits:  None since last CCM contact  Recent consult visits:  None since last CCM contact  Hospital visits:  None in previous 6 months  Medications: Outpatient Encounter Medications as of 02/25/2021  Medication Sig   amitriptyline (ELAVIL) 10 MG tablet Take 1 tablet (10 mg total) by mouth at bedtime as needed for sleep.   amLODipine (NORVASC) 10 MG tablet Take 1 tablet (10 mg total) by mouth daily.   aspirin 81 MG chewable tablet Chew 81 mg by mouth at bedtime.   atorvastatin (LIPITOR) 40 MG tablet Take 1 tablet (40 mg total) by mouth daily.   Blood Glucose Monitoring Suppl (ONETOUCH VERIO) w/Device KIT Use to check blood sugars TID. Dx: E11.9   carvedilol (COREG) 6.25 MG tablet TAKE 1 TABLET BY MOUTH TWICE A DAY   cyanocobalamin (,VITAMIN B-12,) 1000 MCG/ML injection Inject 1 mL (1,000 mcg total) into the muscle every 30 (thirty) days.   cyclobenzaprine (FLEXERIL) 5 MG tablet Take 5 mg by mouth 2 (two) times daily.   diclofenac Sodium (VOLTAREN) 1 % GEL Apply 2-4 g topically 4 (four) times daily.   famotidine (PEPCID) 20 MG tablet Take 1 tablet (20 mg total) by mouth at bedtime.   glimepiride (AMARYL) 4 MG tablet Take 1 tablet (4 mg total) by mouth 2 (two) times daily.   glucose blood (ONETOUCH VERIO) test strip Use to check blood sugars TID. Dx: E11.9   Insulin NPH, Human,, Isophane, (NOVOLIN N FLEXPEN) 100 UNIT/ML Kiwkpen Inject 16 Units into the skin in the morning and at bedtime.   Insulin Pen Needle 32G X 4 MM MISC Use daily with insulin pen.   Insulin Syringe-Needle U-100 (INSULIN SYRINGE 1CC/31GX5/16") 31G X 5/16" 1 ML MISC Use with NPH insulin twice daily.   isosorbide mononitrate (IMDUR) 30 MG 24 hr tablet Take 1 tablet (30 mg total) by mouth daily.   Lancets (ONETOUCH ULTRASOFT)  lancets Use to check blood sugars TID. Dx: E11.9   losartan (COZAAR) 100 MG tablet TAKE ONE TABLET BY MOUTH EVERYDAY AT BEDTIME   metFORMIN (GLUCOPHAGE) 1000 MG tablet Take 0.5 tablets (500 mg total) by mouth 2 (two) times daily with a meal.   nitroGLYCERIN (NITROSTAT) 0.4 MG SL tablet Place 1 tablet (0.4 mg total) under the tongue every 5 (five) minutes as needed for chest pain.   ondansetron (ZOFRAN) 4 MG tablet Take 4 mg by mouth every 8 (eight) hours as needed.   oxyCODONE (OXY IR/ROXICODONE) 5 MG immediate release tablet Take 5 mg by mouth every 6 (six) hours as needed.   pantoprazole (PROTONIX) 40 MG tablet Take 1 tablet (40 mg total) by mouth daily.   Semaglutide,0.25 or 0.5MG /DOS, (OZEMPIC, 0.25 OR 0.5 MG/DOSE,) 2 MG/1.5ML SOPN Inject 0.5 mg into the skin once a week.   tamsulosin (FLOMAX) 0.4 MG CAPS capsule Take 2 capsules (0.8 mg total) by mouth daily.   tiZANidine (ZANAFLEX) 4 MG tablet Take 0.5-1 tablets (2-4 mg total) by mouth every 8 (eight) hours as needed for muscle spasms (Sedation caution).   No facility-administered encounter medications on file as of 02/25/2021.   BP Readings from Last 3 Encounters:  01/27/21 124/80  01/03/21 104/64  11/12/20 126/72    Lab Results  Component Value Date   HGBA1C 7.5 (H) 01/27/2021     No OVs, Consults, or  hospital visits since last care coordination call / Pharmacist visit. No medication changes indicated   Patient obtains medications through Adherence Packaging  30 Days   Patient is due for next adherence delivery on: 03/06/2021  Spoke with patient on 02/25/2021  and reviewed medications and coordinated delivery.  This delivery to include: Adherence Packaging  30 Days  Packs: Aspirin 81mg - 1 tablet daily (breakfast) Pantoprazole 40 mg- 1 tablet daily (1 breakfast) Amlodipine 10mg - 1 tablet daily (1 breakfast) Famotidine 20mg - 1 tablet daily (1 bedtime) Losartan 100mg - 1 tablet daily- (1 bedtime) Isosorbide 30mg - 1 tablet  daily (1 bedtime) Carvedilol 6.25mg - 1 tablet twice daily (1 breakfast, 1 evening meal) Atorvastatin 40mg - 1 tablet daily (1 breakfast) Glimepiride 4mg - 1 tablet twice daily (1 breakfast, 1 evening meal) Tamsulosin 0.4mg  2 capsule daily (1 breakfast, 1 bedtime)   Metformin 1000mg -1/2 tablet twice daily (1/2 breakfast, 1/2 evening meal)  PRN/VIAL medications: Novolin N Flexpen 100 unit/mL (3 mL)- Inject 16 units twice daily  Patient declined the following medications this month: Ozempic 0.5 mg weekly - gets through patient assistance  Refills requested from PCP include: Tamsulosin 0.4mg  2 capsule daily (1 breakfast, 1 bedtime)    Confirmed delivery date of 03/06/2021 , advised patient that pharmacy will contact him the morning of delivery.  Recent blood pressure readings are as follows: no readings available however reminded the patient to take some for future   Recent blood glucose readings are as follows: Fasting:  02/25/2021  174 Before Meals: n/a After Meals: 02/24/2021  76 Bedtime: n/a  Follow-Up:  Coordination of Enhanced Pharmacy Services and Pharmacist Review  Debbora Dus, CPP notified  Avel Sensor, [employer] Assistant [phone]  I have reviewed the care management and care coordination activities outlined in this encounter and I am certifying that I agree with the content of this note. No further action required.  Debbora Dus, PharmD Clinical Pharmacist [place] [facility] at [facility] [phone]

## 2021-02-25 NOTE — Telephone Encounter (Signed)
Patient left a voicemail stating that he is trying to get in touch with his kidney doctor. Patient stated that he has a history of kidney stones. Patient wanted to know if Dr. [name] can give him something for the kidney pain or does he need to come in to be seen first. Tried to call patient back and got his voicemail. Left a message on voicemail for patient to call the office back.

## 2021-02-28 NOTE — Telephone Encounter (Signed)
Contacted [employer] for status of CGM application. Awaiting reply.

## 2021-02-28 NOTE — Telephone Encounter (Signed)
Per Marzetta Board at [employer], they are still working on approval through [employer].

## 2021-02-28 NOTE — Telephone Encounter (Signed)
FYI Have left patient two messages to call the office back. Will wait for patient to call the office back.

## 2021-03-02 NOTE — Telephone Encounter (Signed)
Please see what details you can get on Monday.  Thanks.

## 2021-03-04 ENCOUNTER — Other Ambulatory Visit: Payer: Self-pay | Admitting: Family Medicine

## 2021-03-04 NOTE — Telephone Encounter (Signed)
Spoke with patient this morning. He was able to see his kidney doctor last week and does not need anything from [place] at this time.

## 2021-03-06 ENCOUNTER — Ambulatory Visit (INDEPENDENT_AMBULATORY_CARE_PROVIDER_SITE_OTHER): Payer: Medicare HMO | Admitting: *Deleted

## 2021-03-06 ENCOUNTER — Other Ambulatory Visit (INDEPENDENT_AMBULATORY_CARE_PROVIDER_SITE_OTHER): Payer: Medicare HMO

## 2021-03-06 ENCOUNTER — Other Ambulatory Visit: Payer: Self-pay

## 2021-03-06 DIAGNOSIS — E538 Deficiency of other specified B group vitamins: Secondary | ICD-10-CM

## 2021-03-06 LAB — VITAMIN B12: Vitamin B-12: 258 pg/mL (ref 211–911)

## 2021-03-06 MED ORDER — CYANOCOBALAMIN 1000 MCG/ML IJ SOLN
1000.0000 ug | Freq: Once | INTRAMUSCULAR | Status: AC
  Administered 2021-03-06: 1000 ug via INTRAMUSCULAR

## 2021-03-06 NOTE — Progress Notes (Signed)
Per orders of Dr. [name], injection of Vitamin B12 given in Left Deltoid by Lauralyn Primes.  Patient tolerated injection well.

## 2021-03-19 ENCOUNTER — Other Ambulatory Visit: Payer: Self-pay | Admitting: Family Medicine

## 2021-03-19 ENCOUNTER — Telehealth: Payer: Self-pay | Admitting: *Deleted

## 2021-03-19 DIAGNOSIS — E538 Deficiency of other specified B group vitamins: Secondary | ICD-10-CM

## 2021-03-19 MED ORDER — CYANOCOBALAMIN 1000 MCG/ML IJ SOLN: 1000.0000 ug | INTRAMUSCULAR | Status: DC

## 2021-03-19 NOTE — Telephone Encounter (Signed)
His B12 level improved but is still on the low end of normal.  We could increase his B12 replacement to every 14 days for the next few months and see if that makes a difference in his fatigue.  Please see about setting that up.  Thanks.

## 2021-03-19 NOTE — Telephone Encounter (Signed)
Patient left a voicemail stating that he wanted to let Dr. [name] know that the B-12 shots that he has been taking has not helped at all. Patient stated that he is still real tired. Patient wants to know what else can be done.

## 2021-03-20 ENCOUNTER — Other Ambulatory Visit: Payer: Self-pay

## 2021-03-20 ENCOUNTER — Ambulatory Visit (AMBULATORY_SURGERY_CENTER): Payer: Self-pay | Admitting: *Deleted

## 2021-03-20 ENCOUNTER — Telehealth: Payer: Self-pay | Admitting: *Deleted

## 2021-03-20 VITALS — Ht 70.0 in | Wt 212.6 lb

## 2021-03-20 DIAGNOSIS — Z86010 Personal history of colonic polyps: Secondary | ICD-10-CM

## 2021-03-20 MED ORDER — PEG-KCL-NACL-NASULF-NA ASC-C 100 G PO SOLR: 1.0000 | Freq: Once | ORAL | 0 refills | Status: AC

## 2021-03-20 NOTE — Telephone Encounter (Signed)
Dr. Hilarie Fredrickson,  I saw this pt for his PV- he is having a recall colonoscopy on 04-03-21 for a hx of polyps.  He states he constantly has reflux, whenever he eats.  He is wondering is he could get an upper endoscopy done at the same time.  Please advise.  Thanks, [employer]

## 2021-03-20 NOTE — Telephone Encounter (Signed)
I spoke to pt to give message below. Pt agrees with plan to schedule B12 replacement. He is currently at another healthcare facility and will call to schedule when he leaves.

## 2021-03-20 NOTE — Progress Notes (Signed)
  No trouble with anesthesia, denies being told they were difficult to intubate, or hx/fam hx of malignant hyperthermia per pt   No egg or soy allergy  No home oxygen use   No medications for weight loss taken  Pt denies constipation issues  Pt informed that we do not do prior authorizations for prep  Pt states he is having reflux "all the time, especially when I eat."  He takes Protonix daily; asking if he can have an EGD as well.  TE sent to Dr. Hilarie Fredrickson regarding this.  I told pt I will have to wait for Dr. Vena Rua response and procedure may be on a different day.  Pt voices understanding

## 2021-03-24 NOTE — Telephone Encounter (Signed)
Yes egd for Tony Manning can be added May have to resch if that sessions won't easily accommodate the egd JMP

## 2021-03-24 NOTE — Telephone Encounter (Signed)
Called patient and left a message for him to call back to reschedule colon and add the EGD with it.

## 2021-03-25 ENCOUNTER — Ambulatory Visit: Payer: Medicare HMO

## 2021-03-26 ENCOUNTER — Telehealth: Payer: Self-pay

## 2021-03-26 NOTE — Chronic Care Management (AMB) (Addendum)
Chronic Care Management Pharmacy Assistant   Name: Tony Manning  MRN: [id number] DOB: 11/27/1960  Reason for Encounter: Medication Adherence and Delivery Coordination  Recent office visits:  None since last CCM contact  Recent consult visits:  03/20/21 - Endocrinology  colonoscopy  Hospital visits:  None in previous 6 months  Medications: Outpatient Encounter Medications as of 03/26/2021  Medication Sig   amitriptyline (ELAVIL) 10 MG tablet Take 1 tablet (10 mg total) by mouth at bedtime as needed for sleep.   amLODipine (NORVASC) 10 MG tablet Take 1 tablet (10 mg total) by mouth daily.   aspirin 81 MG chewable tablet Chew 81 mg by mouth at bedtime.   atorvastatin (LIPITOR) 40 MG tablet Take 1 tablet (40 mg total) by mouth daily.   Blood Glucose Monitoring Suppl (ONETOUCH VERIO) w/Device KIT Use to check blood sugars TID. Dx: E11.9   carvedilol (COREG) 6.25 MG tablet TAKE 1 TABLET BY MOUTH TWICE A DAY   cyanocobalamin (,VITAMIN B-12,) 1000 MCG/ML injection Inject 1 mL (1,000 mcg total) into the muscle every 14 (fourteen) days.   cyclobenzaprine (FLEXERIL) 5 MG tablet Take 5 mg by mouth 2 (two) times daily.   diclofenac Sodium (VOLTAREN) 1 % GEL Apply 2-4 g topically 4 (four) times daily. (Patient taking differently: Apply 2-4 g topically 4 (four) times daily. PRN)   famotidine (PEPCID) 20 MG tablet Take 1 tablet (20 mg total) by mouth at bedtime.   glimepiride (AMARYL) 4 MG tablet Take 1 tablet (4 mg total) by mouth 2 (two) times daily.   glucose blood (ONETOUCH VERIO) test strip Use to check blood sugars TID. Dx: E11.9   Insulin NPH, Human,, Isophane, (NOVOLIN N FLEXPEN) 100 UNIT/ML Kiwkpen Inject 16 Units into the skin in the morning and at bedtime.   Insulin Pen Needle 32G X 4 MM MISC Use daily with insulin pen.   Insulin Syringe-Needle U-100 (INSULIN SYRINGE 1CC/31GX5/16") 31G X 5/16" 1 ML MISC Use with NPH insulin twice daily.   isosorbide mononitrate (IMDUR) 30 MG 24  hr tablet Take 1 tablet (30 mg total) by mouth daily.   Lancets (ONETOUCH ULTRASOFT) lancets Use to check blood sugars TID. Dx: E11.9   losartan (COZAAR) 100 MG tablet TAKE ONE TABLET BY MOUTH EVERYDAY AT BEDTIME   metFORMIN (GLUCOPHAGE) 1000 MG tablet Take 0.5 tablets (500 mg total) by mouth 2 (two) times daily with a meal.   nitroGLYCERIN (NITROSTAT) 0.4 MG SL tablet Place 1 tablet (0.4 mg total) under the tongue every 5 (five) minutes as needed for chest pain. (Patient not taking: Reported on 03/20/2021)   ondansetron (ZOFRAN) 4 MG tablet Take 4 mg by mouth every 8 (eight) hours as needed.   oxyCODONE (OXY IR/ROXICODONE) 5 MG immediate release tablet Take 5 mg by mouth every 6 (six) hours as needed.   pantoprazole (PROTONIX) 40 MG tablet Take 1 tablet (40 mg total) by mouth daily.   Semaglutide,0.25 or 0.5MG/DOS, (OZEMPIC, 0.25 OR 0.5 MG/DOSE,) 2 MG/1.5ML SOPN Inject 0.5 mg into the skin once a week.   tamsulosin (FLOMAX) 0.4 MG CAPS capsule TAKE ONE CAPSULE BY MOUTH EVERY MORNING and TAKE ONE CAPSULE BY MOUTH EVERYDAY AT BEDTIME   tiZANidine (ZANAFLEX) 4 MG tablet Take 0.5-1 tablets (2-4 mg total) by mouth every 8 (eight) hours as needed for muscle spasms (Sedation caution).   No facility-administered encounter medications on file as of 03/26/2021.   BP Readings from Last 3 Encounters:  01/27/21 124/80  01/03/21 104/64  11/12/20  126/72    Lab Results  Component Value Date   HGBA1C 7.5 (H) 01/27/2021    Recent OV, Consult or Hospital visit:  03/20/2021  Colonoscopy  Patient obtains medications through Adherence Packaging  30 Days   Last adherence delivery date:03/06/2021      Patient is due for next adherence delivery on: 04/04/2021  Multiple attempts made to reach patient on 03/26/21,03/27/21,03/31/21. Unsuccessful outreach. Will refill based off of last adherence fill.   This delivery to include: Adherence Packaging  30 Days  Packs: Aspirin 64m- 1 tablet daily  (breakfast) Pantoprazole 40 mg- 1 tablet daily (1 breakfast) Amlodipine 154m 1 tablet daily (1 breakfast) Famotidine 2072m1 tablet daily (1 bedtime) Losartan 100m30m tablet daily- (1 bedtime) Isosorbide 30mg88mtablet daily (1 bedtime) Carvedilol 6.25mg-2mablet twice daily (1 breakfast, 1 evening meal) Atorvastatin 40mg- 38mblet daily (1 breakfast) Glimepiride 4mg- 1 58mlet twice daily (1 breakfast, 1 evening meal) Tamsulosin 0.4mg 2 ca68mle daily (1 breakfast, 1 bedtime)   Metformin 500mg 1 ta59m twice daily (1 tablet  breakfast, 1 tablet evening meal)  PRN/VIAL medications: Novolin N Flexpen 100 unit/mL (3 mL)- Inject 16 units twice daily  Patient declined the following medications this month: Ozempic 0.5 mg weekly - gets through patient assistance  Refills requested from PCP include:  tamsulosin  Follow-Up:  Pharmacist Review  Michelle ADebbora Dusfied  Lyndsy Gilberto HuAvel SensorniGolden City [phone] the care management and care coordination activities outlined in this encounter and I am certifying that I agree with the content of this note. No further action required.  Michelle ADebbora Duslinical Pharmacist [place] [facility] [phone]

## 2021-03-27 NOTE — Telephone Encounter (Signed)
Attempted pt- his phone rang, it was answered but all I could hear was back ground noise- pt never answered and could not LM- Tony Manning PV

## 2021-03-28 NOTE — Telephone Encounter (Signed)
Looked at schedule at the end of the day and pt had called and cancelled his colon and did not r/s an endocolon, I could not find any note stating why.

## 2021-03-28 NOTE — Telephone Encounter (Signed)
Attempted pt and his wife 2 x each today, left messages for them to call with confirmation that he wants an endocolon instead of just the colonoscopy, sched for 8/11 but told them I will have to cancel if this is not confirmed today.

## 2021-04-01 ENCOUNTER — Telehealth: Payer: Self-pay

## 2021-04-01 MED ORDER — TAMSULOSIN HCL 0.4 MG PO CAPS: ORAL_CAPSULE | ORAL | 1 refills | Status: DC

## 2021-04-01 NOTE — Telephone Encounter (Signed)
Erx sent

## 2021-04-01 NOTE — Telephone Encounter (Signed)
-----   Message from [place], [facility] sent at 04/01/2021  3:48 PM EDT ----- Regarding: Med Refill Patient requests refill on tamsulosin 0.4 mg to [facility], thank you.  Debbora Dus, PharmD Clinical Pharmacist [place] [facility] at [facility] [phone]

## 2021-04-03 ENCOUNTER — Encounter: Payer: Medicare HMO | Admitting: Internal Medicine

## 2021-04-17 ENCOUNTER — Encounter: Payer: Medicare HMO | Admitting: Internal Medicine

## 2021-04-25 ENCOUNTER — Telehealth: Payer: Self-pay

## 2021-04-25 NOTE — Progress Notes (Addendum)
Chronic Care Management Pharmacy Assistant   Name: Tony Manning  MRN: [id number] DOB: [birth year]  Reason for Encounter: Medication Adherence and Delivery Coordination  Recent office visits:  None since last CCM call  Recent consult visits:  None since last CCM call  Hospital visits:  None in previous 6 months  Medications: Outpatient Encounter Medications as of 04/25/2021  Medication Sig   amitriptyline (ELAVIL) 10 MG tablet Take 1 tablet (10 mg total) by mouth at bedtime as needed for sleep.   amLODipine (NORVASC) 10 MG tablet Take 1 tablet (10 mg total) by mouth daily.   aspirin 81 MG chewable tablet Chew 81 mg by mouth at bedtime.   atorvastatin (LIPITOR) 40 MG tablet Take 1 tablet (40 mg total) by mouth daily.   Blood Glucose Monitoring Suppl (ONETOUCH VERIO) w/Device KIT Use to check blood sugars TID. Dx: E11.9   carvedilol (COREG) 6.25 MG tablet TAKE 1 TABLET BY MOUTH TWICE A DAY   cyanocobalamin (,VITAMIN B-12,) 1000 MCG/ML injection Inject 1 mL (1,000 mcg total) into the muscle every 14 (fourteen) days.   cyclobenzaprine (FLEXERIL) 5 MG tablet Take 5 mg by mouth 2 (two) times daily.   diclofenac Sodium (VOLTAREN) 1 % GEL Apply 2-4 g topically 4 (four) times daily. (Patient taking differently: Apply 2-4 g topically 4 (four) times daily. PRN)   famotidine (PEPCID) 20 MG tablet Take 1 tablet (20 mg total) by mouth at bedtime.   glimepiride (AMARYL) 4 MG tablet Take 1 tablet (4 mg total) by mouth 2 (two) times daily.   glucose blood (ONETOUCH VERIO) test strip Use to check blood sugars TID. Dx: E11.9   Insulin NPH, Human,, Isophane, (NOVOLIN N FLEXPEN) 100 UNIT/ML Kiwkpen Inject 16 Units into the skin in the morning and at bedtime.   Insulin Pen Needle 32G X 4 MM MISC Use daily with insulin pen.   Insulin Syringe-Needle U-100 (INSULIN SYRINGE 1CC/31GX5/16") 31G X 5/16" 1 ML MISC Use with NPH insulin twice daily.   isosorbide mononitrate (IMDUR) 30 MG 24 hr tablet  Take 1 tablet (30 mg total) by mouth daily.   Lancets (ONETOUCH ULTRASOFT) lancets Use to check blood sugars TID. Dx: E11.9   losartan (COZAAR) 100 MG tablet TAKE ONE TABLET BY MOUTH EVERYDAY AT BEDTIME   metFORMIN (GLUCOPHAGE) 1000 MG tablet Take 0.5 tablets (500 mg total) by mouth 2 (two) times daily with a meal.   nitroGLYCERIN (NITROSTAT) 0.4 MG SL tablet Place 1 tablet (0.4 mg total) under the tongue every 5 (five) minutes as needed for chest pain. (Patient not taking: Reported on 03/20/2021)   ondansetron (ZOFRAN) 4 MG tablet Take 4 mg by mouth every 8 (eight) hours as needed.   oxyCODONE (OXY IR/ROXICODONE) 5 MG immediate release tablet Take 5 mg by mouth every 6 (six) hours as needed.   pantoprazole (PROTONIX) 40 MG tablet Take 1 tablet (40 mg total) by mouth daily.   Semaglutide,0.25 or 0.5MG /DOS, (OZEMPIC, 0.25 OR 0.5 MG/DOSE,) 2 MG/1.5ML SOPN Inject 0.5 mg into the skin once a week.   tamsulosin (FLOMAX) 0.4 MG CAPS capsule TAKE ONE CAPSULE BY MOUTH EVERY MORNING and TAKE ONE CAPSULE BY MOUTH EVERYDAY AT BEDTIME   tiZANidine (ZANAFLEX) 4 MG tablet Take 0.5-1 tablets (2-4 mg total) by mouth every 8 (eight) hours as needed for muscle spasms (Sedation caution).   No facility-administered encounter medications on file as of 04/25/2021.   BP Readings from Last 3 Encounters:  01/27/21 124/80  01/03/21 104/64  11/12/20 126/72    Lab Results  Component Value Date   HGBA1C 7.5 (H) 01/27/2021      No OVs, Consults, or hospital visits since last care coordination call / Pharmacist visit. No medication changes indicated   Last adherence delivery date: 04/04/2021      Patient is due for next adherence delivery on: 05/06/2021  Spoke with patient on 04/25/21 reviewed medications and coordinated delivery.  This delivery to include: Adherence Packaging  30 Days  Packs: Losartan 100mg - 1 tablet daily- (1 bedtime) Famotidine 20mg - 1 tablet daily (1 bedtime) Isosorbide 30mg - 1 tablet daily  (1 bedtime) Carvedilol 6.25mg - 1 tablet twice daily (1 breakfast, 1 evening meal) Aspirin 81mg - 1 tablet daily (breakfast) Metformin 500mg  1 tablet twice daily (1 tablet  breakfast, 1 tablet evening meal) Amlodipine 10mg - 1 tablet daily (1 breakfast) Tamsulosin 0.4mg  2 capsule daily (1 breakfast, 1 bedtime)   Glimepiride 4mg - 1 tablet twice daily (1 breakfast, 1 evening meal) Atorvastatin 40mg - 1 tablet daily (1 breakfast) Pantoprazole 40 mg- 1 tablet daily (1 breakfast)  VIAL medications: Novolin N Flexpen 100 unit/mL (3 mL)- Inject 16 units twice daily (states sugars have been lower so he usually only injects about 3 times a week)  Patient declined the following medications this month: Ozempic 0.5 mg weekly - gets through patient assistance  Any concerns about your medications? No  How often do you forget or accidentally miss a dose? Once a month, sometimes twice a month  Do you use a pillbox? No  Is patient in packaging Yes  If yes  What is the date on your next pill pack? 04/23/21  Any concerns or issues with your packaging? no   Refills requested from PCP include: Pantoprazole and Atorvastatin requested by [facility].  Confirmed delivery date of 05/06/21, advised patient that pharmacy will contact them the morning of delivery.  Recent blood pressure readings are as follows: None  Recent blood glucose readings are as follows: Fasting: States readings have been ranging 100-120. Usually around 105.   Patient states he has one pen left of the Novolin. Notes that he has not had to use it every day due to blood sugars being in the low 100's. States if his blood sugar is around 105 he does not use it. Averages about 3 injections a week. Patient asked that [facility] only fill 2 pens. Does not want the full amount. Patient requested medications be left in the black mailbox- advised patient delivery drivers are unable to put anything in the mail box. Patient states he now has a  PO box so his mailbox does not receive any mail and is only used for packages.    Debbora Dus, CPP notified  Margaretmary Dys, [place] [facility]acy Assistant [phone]  I have reviewed the care management and care coordination activities outlined in this encounter and I am certifying that I agree with the content of this note. Scheduled CCM visit to discuss diabetes. No further action needed.  Debbora Dus, PharmD Clinical Pharmacist [facility] at [facility] [phone]

## 2021-04-28 ENCOUNTER — Other Ambulatory Visit: Payer: Self-pay | Admitting: Orthopedic Surgery

## 2021-04-28 DIAGNOSIS — M5412 Radiculopathy, cervical region: Secondary | ICD-10-CM | POA: Diagnosis not present

## 2021-04-28 DIAGNOSIS — M533 Sacrococcygeal disorders, not elsewhere classified: Secondary | ICD-10-CM | POA: Diagnosis not present

## 2021-05-01 ENCOUNTER — Ambulatory Visit
Admission: RE | Admit: 2021-05-01 | Discharge: 2021-05-01 | Disposition: A | Discharge status: Home / Self Care | Payer: Medicare HMO | Source: Ambulatory Visit | Attending: Orthopedic Surgery | Admitting: Orthopedic Surgery

## 2021-05-01 ENCOUNTER — Other Ambulatory Visit: Payer: Self-pay

## 2021-05-01 ENCOUNTER — Ambulatory Visit: Payer: Medicare HMO

## 2021-05-01 DIAGNOSIS — M533 Sacrococcygeal disorders, not elsewhere classified: Secondary | ICD-10-CM

## 2021-05-05 ENCOUNTER — Ambulatory Visit: Payer: Medicare HMO | Admitting: Cardiology

## 2021-05-05 ENCOUNTER — Encounter: Payer: Self-pay | Admitting: Cardiology

## 2021-05-05 ENCOUNTER — Other Ambulatory Visit: Payer: Self-pay

## 2021-05-05 VITALS — BP 126/72 | HR 65 | Temp 98.1°F | Resp 17 | Ht 70.0 in | Wt 202.0 lb

## 2021-05-05 DIAGNOSIS — E782 Mixed hyperlipidemia: Secondary | ICD-10-CM | POA: Diagnosis not present

## 2021-05-05 DIAGNOSIS — I25118 Atherosclerotic heart disease of native coronary artery with other forms of angina pectoris: Secondary | ICD-10-CM | POA: Diagnosis not present

## 2021-05-05 DIAGNOSIS — E1122 Type 2 diabetes mellitus with diabetic chronic kidney disease: Secondary | ICD-10-CM

## 2021-05-05 DIAGNOSIS — I10 Essential (primary) hypertension: Secondary | ICD-10-CM

## 2021-05-05 DIAGNOSIS — N183 Chronic kidney disease, stage 3 unspecified: Secondary | ICD-10-CM | POA: Diagnosis not present

## 2021-05-05 DIAGNOSIS — E1165 Type 2 diabetes mellitus with hyperglycemia: Secondary | ICD-10-CM | POA: Diagnosis not present

## 2021-05-05 DIAGNOSIS — IMO0002 Reserved for concepts with insufficient information to code with codable children: Secondary | ICD-10-CM

## 2021-05-05 NOTE — Progress Notes (Signed)
Primary Physician/Referring:  Tonia Ghent, MD  Patient ID: Tony Manning, male    DOB: Oct 22, 1960, 60 y.o.   MRN: [id number]  Chief Complaint  Patient presents with   Hypertension   Follow-up    6 month   hld    HPI: Tony Manning  is a 60 y.o. male  male  with coronary artery disease status post PCI to RCA and LAD, long-standing degenerative disc disease and involving neck and shoulders, sciatica, hyperlipidemia, OSA on CPAP, uncontrolled DM, hypertension presents for 69-monthvisit and follow-up of coronary disease, hypertension and hyperlipidemia.  Is a 655-monthffice visit, previously he had complained of chest pain underwent stress testing which was low risk.  He is presently doing well and has not had any recurrence of angina pectoris and has not not used any sublingual nitroglycerin.  Last night he sprained his back and his sciatica is acting up again.  Otherwise no specific complaints, dyspnea has remained stable and is mild with no PND or orthopnea or leg edema.  Past Medical History:  Diagnosis Date   Anginal pain ([place]   Basal cell carcinoma of face    Chest pain, unspecified    Chronic kidney disease    kidney stones   Coronary atherosclerosis of native coronary artery    s/p stent x4   Depression    Diabetes mellitus without mention of complication    Diverticulosis    Dyspnea    Dysrhythmia    Erectile dysfunction    Essential hypertension, benign    GERD (gastroesophageal reflux disease)    Heart murmur    History of kidney stones    History of nephrolithiasis    Hyperlipidemia    Migraine    [place]ocardial infarction ([place]   2016   Neuromuscular disorder ([facility]   Osteoarthritis    Pneumonia    Postsurgical percutaneous transluminal coronary angioplasty status    s/p stent x4   Sarcoidosis    Sleep apnea    uses CPAP   Unspecified sleep apnea    uses C-pap    Past Surgical History:  Procedure Laterality Date   ANTERIOR CERVICAL  DECOMP/DISCECTOMY FUSION N/A 04/07/2017   Procedure: ANTERIOR CERVICAL DECOMPRESSION FUSION, CERVICAL 4-5 WITH INSTRUMENTATION AND ALLOGRAFT;  Surgeon: DuPhylliss BobMD;  [place] [facility] Service: [place]ics;  Laterality: N/A;  ANTERIOR CERVICAL DECOMPRESSION FUSION, CERVICAL 4-5 WITH INSTRUMENTATION AND ALLOGRAFT; REQUEST 2.5 HOURS AND FLIP ROOM   APPENDECTOMY  2005   CARPAL TUNNEL RELEASE     CERVICAL FUSION  2009   COLONOSCOPY     CORONARY ANGIOPLASTY WITH STENT PLACEMENT     2004   CORONARY STENT INTERVENTION N/A 10/16/2016   Procedure: Coronary Stent Intervention;  Surgeon: JaAdrian ProwsMD;  [facility];  Service: Cardiovascular;  Laterality: N/A;   CYSTOSCOPY WITH RETROGRADE PYELOGRAM, URETEROSCOPY AND STENT PLACEMENT Right 05/12/2019   Procedure: CYSTOSCOPY WITH RIGHT RETROGRADE PYELOGRAM, URETEROSCOPY HOLMIUM LASER AND STENT PLACEMENT;  Surgeon: BeLucas MallowMD;  Location: [facility];  Service: Urology;  Laterality: Right;   ELBOW SURGERY     bilateral   HAND SURGERY Left    KNEE SURGERY Bilateral    Bilateral    LEFT HEART CATH AND CORONARY ANGIOGRAPHY N/A 10/16/2016   Procedure: Left Heart Cath and Coronary Angiography;  Surgeon: JaAdrian ProwsMD;  [facility];  Service: Cardiovascular;  Laterality: N/A;   LEFT HEART CATHETERIZATION WITH CORONARY  ANGIOGRAM N/A 06/14/2014   Procedure: LEFT HEART CATHETERIZATION WITH CORONARY ANGIOGRAM;  Surgeon: Laverda Page, MD;  Location: [facility] CATH LAB;  Service: Cardiovascular;  Laterality: N/A;   LUNG BIOPSY     sarcoid   ROTATOR CUFF REPAIR     left   SHOULDER ARTHROSCOPY WITH SUBACROMIAL DECOMPRESSION Left 06/16/2018   Procedure: LEFT SHOULDER ARTHROSCOPY WITH ROTATOR CUFF DEBRIDEMENT AND  SUBACROMIAL DECOMPRESSION;  Surgeon: Tania Ade, MD;  Location: [facility];  Service: Orthopedics;  Laterality: Left;   Social History   Tobacco Use   Smoking status: Never   Smokeless tobacco: Former    Types:  Chew    Quit date: 02/06/2016  Substance Use Topics   Alcohol use: No    Alcohol/week: 0.0 standard drinks   Marital Status: Married   ROS  Review of Systems  Cardiovascular:  Positive for dyspnea on exertion. Negative for chest pain and leg swelling.  Musculoskeletal:  Positive for arthritis and back pain.  Gastrointestinal:  Negative for melena.  Objective  Blood pressure 126/72, pulse 65, temperature 98.1 F (36.7 C), temperature source Temporal, resp. rate 17, height 5' 10" (1.778 m), weight 202 lb (91.6 kg), SpO2 99 %. Body mass index is 28.98 kg/m.  Vitals with BMI 05/05/2021 03/20/2021 01/27/2021  Height 5' 10" 5' 10" 5' 10"  Weight 202 lbs 212 lbs 10 oz 214 lbs  BMI 28.98 65.46 50.35  Systolic 465 - 681  Diastolic 72 - 80  Pulse 65 - 62    Physical Exam Vitals reviewed.  Constitutional:      General: He is not in acute distress.    Appearance: He is well-developed.  HENT:     Head: Normocephalic and atraumatic.  Neck:     Thyroid: No thyromegaly.     Vascular: No carotid bruit or JVD.  Cardiovascular:     Rate and Rhythm: Normal rate and regular rhythm.     Pulses: Normal pulses and intact distal pulses.     Heart sounds: Normal heart sounds, S1 normal and S2 normal. No murmur heard.   No gallop.  Pulmonary:     Effort: Pulmonary effort is normal. No respiratory distress.     Breath sounds: Normal breath sounds. No wheezing, rhonchi or rales.  Abdominal:     General: Bowel sounds are normal.     Palpations: Abdomen is soft.  Musculoskeletal:        General: Swelling present. Normal range of motion.     Right lower leg: No edema.     Left lower leg: No edema.  Skin:    General: Skin is warm and dry.  Neurological:     General: No focal deficit present.     Mental Status: He is alert and oriented to person, place, and time.   Laboratory examination:   CMP Latest Ref Rng & Units 01/27/2021 11/12/2020 03/05/2020  Glucose 70 - 99 mg/dL 157(H) 168(H) 80  BUN 6 -  23 mg/dL 24(H) 25(H) 18  Creatinine 0.40 - 1.50 mg/dL 1.66(H) 1.47(H) 1.43  Sodium 135 - 145 mEq/L 139 140 141  Potassium 3.5 - 5.1 mEq/L 4.8 4.7 4.4  Chloride 96 - 112 mEq/L 106 109 107  CO2 19 - 32 mEq/L 25 21(L) 27  Calcium 8.4 - 10.5 mg/dL 10.1 10.0 9.8  Total Protein 6.0 - 8.3 g/dL 7.5 - 7.5  Total Bilirubin 0.2 - 1.2 mg/dL 0.6 - 0.5  Alkaline Phos 39 - 117 U/L 78 - 67  AST 0 -  37 U/L 20 - 20  ALT 0 - 53 U/L 21 - 21   CBC Latest Ref Rng & Units 01/27/2021 11/12/2020 03/05/2020  WBC 4.0 - 10.5 K/uL 7.9 6.8 8.1  Hemoglobin 13.0 - 17.0 g/dL 14.0 13.3 13.6  Hematocrit 39.0 - 52.0 % 40.6 37.9(L) 39.1  Platelets 150.0 - 400.0 K/uL 223.0 215 209.0   Lipid Panel     Component Value Date/Time   CHOL 120 03/05/2020 1446   TRIG 89.0 03/05/2020 1446   HDL 43.00 03/05/2020 1446   CHOLHDL 3 03/05/2020 1446   VLDL 17.8 03/05/2020 1446   LDLCALC 59 03/05/2020 1446    Lipid Panel No results for input(s): CHOL, TRIG, LDLCALC, VLDL, HDL, CHOLHDL, LDLDIRECT in the last 8760 hours.   HEMOGLOBIN A1C Lab Results  Component Value Date   HGBA1C 7.5 (H) 01/27/2021   MPG 180.03 05/11/2019   TSH Recent Labs    01/27/21 1240  TSH 1.34    Outpatient Medications Prior to Visit  Medication Sig Dispense Refill   amitriptyline (ELAVIL) 10 MG tablet Take 1 tablet (10 mg total) by mouth at bedtime as needed for sleep.     amLODipine (NORVASC) 10 MG tablet Take 1 tablet (10 mg total) by mouth daily. 90 tablet 3   aspirin 81 MG chewable tablet Chew 81 mg by mouth at bedtime.     atorvastatin (LIPITOR) 40 MG tablet Take 1 tablet (40 mg total) by mouth daily. 90 tablet 3   Blood Glucose Monitoring Suppl (ONETOUCH VERIO) w/Device KIT Use to check blood sugars TID. Dx: E11.9 1 kit 0   carvedilol (COREG) 6.25 MG tablet TAKE 1 TABLET BY MOUTH TWICE A DAY 180 tablet 3   cyanocobalamin (,VITAMIN B-12,) 1000 MCG/ML injection Inject 1 mL (1,000 mcg total) into the muscle every 14 (fourteen) days. 1 mL     cyclobenzaprine (FLEXERIL) 5 MG tablet Take 5 mg by mouth 3 (three) times daily as needed for muscle spasms.     diclofenac Sodium (VOLTAREN) 1 % GEL Apply 2-4 g topically 4 (four) times daily. (Patient taking differently: Apply 2-4 g topically 4 (four) times daily. PRN) 100 g 5   famotidine (PEPCID) 20 MG tablet Take 1 tablet (20 mg total) by mouth at bedtime. 90 tablet 2   glimepiride (AMARYL) 4 MG tablet Take 1 tablet (4 mg total) by mouth 2 (two) times daily. 180 tablet 1   glucose blood (ONETOUCH VERIO) test strip Use to check blood sugars TID. Dx: E11.9 300 each 12   Insulin NPH, Human,, Isophane, (NOVOLIN N FLEXPEN) 100 UNIT/ML Kiwkpen Inject 16 Units into the skin in the morning and at bedtime. 15 mL 11   Insulin Pen Needle 32G X 4 MM MISC Use daily with insulin pen. 100 each 3   Insulin Syringe-Needle U-100 (INSULIN SYRINGE 1CC/31GX5/16") 31G X 5/16" 1 ML MISC Use with NPH insulin twice daily. 100 each 3   isosorbide mononitrate (IMDUR) 30 MG 24 hr tablet Take 1 tablet (30 mg total) by mouth daily. 90 tablet 3   Lancets (ONETOUCH ULTRASOFT) lancets Use to check blood sugars TID. Dx: E11.9 300 each 12   losartan (COZAAR) 100 MG tablet TAKE ONE TABLET BY MOUTH EVERYDAY AT BEDTIME 90 tablet 1   metFORMIN (GLUCOPHAGE) 1000 MG tablet Take 0.5 tablets (500 mg total) by mouth 2 (two) times daily with a meal. 90 tablet 2   nitroGLYCERIN (NITROSTAT) 0.4 MG SL tablet Place 1 tablet (0.4 mg total) under the tongue every  5 (five) minutes as needed for chest pain. 25 tablet 3   ondansetron (ZOFRAN) 4 MG tablet Take 4 mg by mouth every 8 (eight) hours as needed.     oxyCODONE (OXY IR/ROXICODONE) 5 MG immediate release tablet Take 5 mg by mouth every 6 (six) hours as needed.     pantoprazole (PROTONIX) 40 MG tablet Take 1 tablet (40 mg total) by mouth daily. 30 tablet 3   Semaglutide,0.25 or 0.5MG/DOS, (OZEMPIC, 0.25 OR 0.5 MG/DOSE,) 2 MG/1.5ML SOPN Inject 0.5 mg into the skin once a week.     tamsulosin  (FLOMAX) 0.4 MG CAPS capsule TAKE ONE CAPSULE BY MOUTH EVERY MORNING and TAKE ONE CAPSULE BY MOUTH EVERYDAY AT BEDTIME 60 capsule 1   cyclobenzaprine (FLEXERIL) 5 MG tablet Take 5 mg by mouth 2 (two) times daily.     tiZANidine (ZANAFLEX) 4 MG tablet Take 0.5-1 tablets (2-4 mg total) by mouth every 8 (eight) hours as needed for muscle spasms (Sedation caution). 30 tablet 0   No facility-administered medications prior to visit.   Allergies  Allergen Reactions   Ambien [Zolpidem] Other (See Comments)    Parasomnias, sleep walking   Lisinopril Cough   Tramadol Diarrhea    Cardiac Studies:   Echocardiogram 02/01/2018: Left ventricle cavity is normal in size. Normal global wall motion. Indeterminate diastolic filling pattern, indeterminate LAP. Calculated EF 55%. No significant valvular abnormality. Inadequate tricuspid regurgitation jet to estimate pulmonary artery pressure. Normal right atrial pressure.  Coronary angiogram 10/16/2016: 3.0 x 38 mm onyx DES Prox RCA. 06/14/2014: Thrombectomy of the mid RCA & Stenting with 3.0 x 18 mm Xience Alpine DES. Stenting mid LAD 3.0 x 18 mm Xience Alpine DES. Patent Mid LAD 2.75x12 mm Taxus, 2.5x12 Distal RCA stent x 2 from 2004 and 2007. Normal LVEF. Diffuse CAD.  PCV MYOCARDIAL PERFUSION WITH LEXISCAN 10/30/2020 Lexiscan nuclear stress test performed using 1-day protocol. SPECT imaging shows diaphragmatic tissue attenuation, but otherwise normal myocardial perfusion. Stress LVEF 63%. Low risk study.  EKG   EKG 05/05/2021: Normal sinus rhythm at rate of 60 bpm, poor R wave progression, cannot exclude anteroseptal infarct old.  No evidence of ischemia.  No significant change from 07/23/2020.   Assessment   Coronary artery disease of native artery of native heart with stable angina pectoris (HCC)  Essential hypertension, benign - Plan: EKG 12-Lead  Mixed hyperlipidemia  Uncontrolled diabetes with stage 3 chronic kidney disease GFR 30-59  (HCC)  Medications Discontinued During This Encounter  Medication Reason   cyclobenzaprine (FLEXERIL) 5 MG tablet Error   tiZANidine (ZANAFLEX) 4 MG tablet Error    No orders of the defined types were placed in this encounter.   Recommendations:   Tony Manning  is a 60 y.o. male  with coronary artery disease status post PCI to RCA and LAD, long-standing degenerative disc disease and involving neck and shoulders, sciatica, hyperlipidemia, OSA on CPAP, uncontrolled DM, hypertension presents for 88-monthvisit and follow-up of coronary disease, hypertension and hyperlipidemia.  Except for chronic back pain which has gotten worse and today he has had an episode of sciatica that got worse since last night, he has no specific complaints.  He has not had any recurrence of angina pectoris and he has not used sublingual nitroglycerin in the past 6 months.  Hemoglobin A1c is improved.  Renal function has remained stable.  I reviewed his labs, lipids are also very well controlled.  He has maintained weight loss.  Overall doing well from cardiac  standpoint, I will see him back on an annual basis.

## 2021-05-07 DIAGNOSIS — M542 Cervicalgia: Secondary | ICD-10-CM | POA: Diagnosis not present

## 2021-05-08 ENCOUNTER — Other Ambulatory Visit: Payer: Self-pay

## 2021-05-08 ENCOUNTER — Ambulatory Visit (INDEPENDENT_AMBULATORY_CARE_PROVIDER_SITE_OTHER): Payer: Medicare HMO

## 2021-05-08 DIAGNOSIS — E538 Deficiency of other specified B group vitamins: Secondary | ICD-10-CM

## 2021-05-08 MED ORDER — CYANOCOBALAMIN 1000 MCG/ML IJ SOLN
1000.0000 ug | Freq: Once | INTRAMUSCULAR | Status: AC
  Administered 2021-05-08: 1000 ug via INTRAMUSCULAR

## 2021-05-08 NOTE — Progress Notes (Signed)
Per orders of Dr. Duncan, injection of vit B12 given by Labrenda Lasky. Patient tolerated injection well.  

## 2021-05-16 DIAGNOSIS — M545 Low back pain, unspecified: Secondary | ICD-10-CM | POA: Diagnosis not present

## 2021-05-16 DIAGNOSIS — M542 Cervicalgia: Secondary | ICD-10-CM | POA: Diagnosis not present

## 2021-05-19 ENCOUNTER — Other Ambulatory Visit: Payer: Self-pay | Admitting: Family Medicine

## 2021-05-19 ENCOUNTER — Encounter: Payer: Medicare HMO | Admitting: Family Medicine

## 2021-05-20 ENCOUNTER — Telehealth: Payer: Self-pay

## 2021-05-20 NOTE — Telephone Encounter (Signed)
Patient called to cancel nursing visit on 05/22/21 this week due to exposure to COVID-19. He also missed physical 9/12 for same reason. Please call patient to reschedule. He tried to reach front desk but unable to reach, line disconnected multiple times.  Debbora Dus, PharmD Clinical Pharmacist [facility] at [facility] [phone]

## 2021-05-21 ENCOUNTER — Telehealth: Payer: Self-pay

## 2021-05-21 NOTE — Telephone Encounter (Signed)
Patient is enrolled in [employer] assistance program for Ozempic 0.5 mg weekly. A new form is needed to update address for shipping to: [facility] [address], [place], [place] [address]  Pages 4-5 of application completed with new address and will need PCP signature. Refill/reorder request form also completed and will need PCP signature. Applications have been sent to [place] for signature and fax to [employer] to avoid delays in patient receiving their medication. Patient will need to contacted upon arrival to [facility] to pick up medication delivery.  Debbora Dus, PharmD Clinical Pharmacist [facility] at [facility] [phone]

## 2021-05-21 NOTE — Telephone Encounter (Signed)
Patient also called to ask about freestyle libre. Elenor Legato was not approved by insurance despite appeal. Price without insurance is $110 per 28 day supply. Patient would like to hold off due to cost. He reports finger sticks very bothersome so he is unable to check glucose frequently. I let him know we have samples of Libre 14 day in office if he would like one, please let me know.  Debbora Dus, PharmD Clinical Pharmacist [facility] at [facility] [phone]

## 2021-05-22 ENCOUNTER — Ambulatory Visit: Payer: Medicare HMO

## 2021-05-22 NOTE — Telephone Encounter (Signed)
Forms have been signed and faxed to assistance program. Received confirmation they went thru.

## 2021-05-26 ENCOUNTER — Telehealth: Payer: Self-pay

## 2021-05-26 NOTE — Chronic Care Management (AMB) (Addendum)
Chronic Care Management Pharmacy Assistant   Name: Tony Manning  MRN: [id number] DOB: April 15, 1961  Reason for Encounter: CCM Reminder Call   Conditions to be addressed/monitored: HTN and DMII  Medications: Outpatient Encounter Medications as of 05/26/2021  Medication Sig   amitriptyline (ELAVIL) 10 MG tablet Take 1 tablet (10 mg total) by mouth at bedtime as needed for sleep.   amLODipine (NORVASC) 10 MG tablet Take 1 tablet (10 mg total) by mouth daily.   aspirin 81 MG chewable tablet Chew 81 mg by mouth at bedtime.   atorvastatin (LIPITOR) 40 MG tablet Take 1 tablet (40 mg total) by mouth daily.   Blood Glucose Monitoring Suppl (ONETOUCH VERIO) w/Device KIT Use to check blood sugars TID. Dx: E11.9   carvedilol (COREG) 6.25 MG tablet TAKE 1 TABLET BY MOUTH TWICE A DAY   cyanocobalamin (,VITAMIN B-12,) 1000 MCG/ML injection Inject 1 mL (1,000 mcg total) into the muscle every 14 (fourteen) days.   cyclobenzaprine (FLEXERIL) 5 MG tablet Take 5 mg by mouth 3 (three) times daily as needed for muscle spasms.   diclofenac Sodium (VOLTAREN) 1 % GEL Apply 2-4 g topically 4 (four) times daily. (Patient taking differently: Apply 2-4 g topically 4 (four) times daily. PRN)   famotidine (PEPCID) 20 MG tablet Take 1 tablet (20 mg total) by mouth at bedtime.   glimepiride (AMARYL) 4 MG tablet Take 1 tablet (4 mg total) by mouth 2 (two) times daily.   glucose blood (ONETOUCH VERIO) test strip Use to check blood sugars TID. Dx: E11.9   Insulin NPH, Human,, Isophane, (NOVOLIN N FLEXPEN) 100 UNIT/ML Kiwkpen Inject 16 Units into the skin in the morning and at bedtime.   Insulin Pen Needle 32G X 4 MM MISC Use daily with insulin pen.   Insulin Syringe-Needle U-100 (INSULIN SYRINGE 1CC/31GX5/16") 31G X 5/16" 1 ML MISC Use with NPH insulin twice daily.   isosorbide mononitrate (IMDUR) 30 MG 24 hr tablet Take 1 tablet (30 mg total) by mouth daily.   Lancets (ONETOUCH ULTRASOFT) lancets Use to check  blood sugars TID. Dx: E11.9   losartan (COZAAR) 100 MG tablet TAKE ONE TABLET BY MOUTH EVERYDAY AT BEDTIME   metFORMIN (GLUCOPHAGE) 1000 MG tablet Take 0.5 tablets (500 mg total) by mouth 2 (two) times daily with a meal.   nitroGLYCERIN (NITROSTAT) 0.4 MG SL tablet Place 1 tablet (0.4 mg total) under the tongue every 5 (five) minutes as needed for chest pain.   ondansetron (ZOFRAN) 4 MG tablet Take 4 mg by mouth every 8 (eight) hours as needed.   oxyCODONE (OXY IR/ROXICODONE) 5 MG immediate release tablet Take 5 mg by mouth every 6 (six) hours as needed.   pantoprazole (PROTONIX) 40 MG tablet Take 1 tablet (40 mg total) by mouth daily.   Semaglutide,0.25 or 0.5MG/DOS, (OZEMPIC, 0.25 OR 0.5 MG/DOSE,) 2 MG/1.5ML SOPN Inject 0.5 mg into the skin once a week.   tamsulosin (FLOMAX) 0.4 MG CAPS capsule TAKE ONE TABLET BY MOUTH EVERY MORNING and TAKE ONE TABLET BY MOUTH EVERYDAY AT BEDTIME   No facility-administered encounter medications on file as of 05/26/2021.    Tony Manning was contacted to remind him of his upcoming telephone visit with Debbora Dus on 06/02/21 at 10:00. Patient was reminded to have all medications, supplements and any blood glucose and blood pressure readings available for review at appointment.   Are you having any problems with your medications? No  Do you have any concerns you like to discuss  with the pharmacist? No    Star Rating Drugs: Medication:  Last Fill: Day Supply Atorvastatin 69m 05/02/21 30 Glimepiride 457m8/23/22 30 Novolin  04/29/21 30 Ozempic  Gets from patient assistance Losartan 10079m/23/22 30 Metformin 500m61m23/22 30  AdaP notified  VelmAvel SensorMACity Viewistant [phone] have reviewed the care management and care coordination activities outlined in this encounter and I am certifying that I agree with the content of this note. No further action required.  MichDebbora DusarmD Clinical  Pharmacist [facility]-[phone]

## 2021-05-27 ENCOUNTER — Telehealth: Payer: Self-pay

## 2021-05-27 ENCOUNTER — Ambulatory Visit: Payer: Medicare HMO

## 2021-05-27 NOTE — Progress Notes (Addendum)
Chronic Care Management Pharmacy Assistant   Name: Tony Manning  MRN: [id number] DOB: October 06, 1960  Reason for Encounter: Medication Adherence and Delivery Coordination   Recent office visits:  05/08/2021 - Patient presented for B12 injection.  Recent consult visits:  05/05/2020 - Adrian Prows, MD Cardiology - Patient presented for 6 month HTN follow up. Changed: cyclobenzaprine (FLEXERIL) 5 MG tablet take three times daily (was two times daily).  Hospital visits:  None in previous 6 months  Medications: Outpatient Encounter Medications as of 05/27/2021  Medication Sig   amitriptyline (ELAVIL) 10 MG tablet Take 1 tablet (10 mg total) by mouth at bedtime as needed for sleep.   amLODipine (NORVASC) 10 MG tablet Take 1 tablet (10 mg total) by mouth daily.   aspirin 81 MG chewable tablet Chew 81 mg by mouth at bedtime.   atorvastatin (LIPITOR) 40 MG tablet Take 1 tablet (40 mg total) by mouth daily.   Blood Glucose Monitoring Suppl (ONETOUCH VERIO) w/Device KIT Use to check blood sugars TID. Dx: E11.9   carvedilol (COREG) 6.25 MG tablet TAKE 1 TABLET BY MOUTH TWICE A DAY   cyanocobalamin (,VITAMIN B-12,) 1000 MCG/ML injection Inject 1 mL (1,000 mcg total) into the muscle every 14 (fourteen) days.   cyclobenzaprine (FLEXERIL) 5 MG tablet Take 5 mg by mouth 3 (three) times daily as needed for muscle spasms.   diclofenac Sodium (VOLTAREN) 1 % GEL Apply 2-4 g topically 4 (four) times daily. (Patient taking differently: Apply 2-4 g topically 4 (four) times daily. PRN)   famotidine (PEPCID) 20 MG tablet Take 1 tablet (20 mg total) by mouth at bedtime.   glimepiride (AMARYL) 4 MG tablet Take 1 tablet (4 mg total) by mouth 2 (two) times daily.   glucose blood (ONETOUCH VERIO) test strip Use to check blood sugars TID. Dx: E11.9   Insulin NPH, Human,, Isophane, (NOVOLIN N FLEXPEN) 100 UNIT/ML Kiwkpen Inject 16 Units into the skin in the morning and at bedtime.   Insulin Pen Needle 32G X 4 MM  MISC Use daily with insulin pen.   Insulin Syringe-Needle U-100 (INSULIN SYRINGE 1CC/31GX5/16") 31G X 5/16" 1 ML MISC Use with NPH insulin twice daily.   isosorbide mononitrate (IMDUR) 30 MG 24 hr tablet Take 1 tablet (30 mg total) by mouth daily.   Lancets (ONETOUCH ULTRASOFT) lancets Use to check blood sugars TID. Dx: E11.9   losartan (COZAAR) 100 MG tablet TAKE ONE TABLET BY MOUTH EVERYDAY AT BEDTIME   metFORMIN (GLUCOPHAGE) 1000 MG tablet Take 0.5 tablets (500 mg total) by mouth 2 (two) times daily with a meal.   nitroGLYCERIN (NITROSTAT) 0.4 MG SL tablet Place 1 tablet (0.4 mg total) under the tongue every 5 (five) minutes as needed for chest pain.   ondansetron (ZOFRAN) 4 MG tablet Take 4 mg by mouth every 8 (eight) hours as needed.   oxyCODONE (OXY IR/ROXICODONE) 5 MG immediate release tablet Take 5 mg by mouth every 6 (six) hours as needed.   pantoprazole (PROTONIX) 40 MG tablet Take 1 tablet (40 mg total) by mouth daily.   Semaglutide,0.25 or 0.5MG /DOS, (OZEMPIC, 0.25 OR 0.5 MG/DOSE,) 2 MG/1.5ML SOPN Inject 0.5 mg into the skin once a week.   tamsulosin (FLOMAX) 0.4 MG CAPS capsule TAKE ONE TABLET BY MOUTH EVERY MORNING and TAKE ONE TABLET BY MOUTH EVERYDAY AT BEDTIME   No facility-administered encounter medications on file as of 05/27/2021.   BP Readings from Last 3 Encounters:  05/05/21 126/72  01/27/21 124/80  01/03/21  104/64    Lab Results  Component Value Date   HGBA1C 7.5 (H) 01/27/2021    Recent OV, Consult or Hospital visit:  Recent medication changes indicated:   Last adherence delivery date: 05/06/2021      Patient is due for next adherence delivery on: 06/04/2021  Spoke with patient on 05/29/2021 reviewed medications and coordinated delivery.  This delivery to include: Adherence Packaging  30 Days  Packs: Losartan 100mg - 1 tablet daily- (1 bedtime) Famotidine 20mg - 1 tablet daily (1 bedtime) Isosorbide 30mg - 1 tablet daily (1 bedtime) Carvedilol 6.25mg - 1  tablet twice daily (1 breakfast, 1 evening meal) Aspirin 81mg - 1 tablet daily (breakfast) Metformin 500mg  1 tablet twice daily (1 tablet  breakfast, 1 tablet evening meal) Amlodipine 10mg - 1 tablet daily (1 breakfast) Tamsulosin 0.4mg  2 capsule daily (1 breakfast, 1 bedtime)   Glimepiride 4mg - 1 tablet twice daily (1 breakfast, 1 evening meal) Atorvastatin 40mg - 1 tablet daily (1 breakfast) Pantoprazole 40 mg- 1 tablet daily (1 breakfast)   VIAL medications: Novolin N Flexpen 100 unit/mL (3 mL)- Inject 16 units twice daily (states sugars have been lower so he usually only injects about 3 times a week) 3.5 remaining - only uses it once 2-3 days Onetouch Verio test strips - PRN   Patient declined the following medications this month: Ozempic 0.5 mg weekly - gets through patient assistance  Any concerns about your medications? No  How often do you forget or accidentally miss a dose? Rarely  Do you use a pillbox? No  Is patient in packaging Yes  If yes  What is the date on your next pill pack? Patient stated he did not know as he was not home.   Any concerns or issues with your packaging? No  Refills requested from providers include: Pantoprazole 40 mg- 1 tablet daily (1 breakfast)  Confirmed delivery date of 06/04/2021, advised patient that pharmacy will contact them the morning of delivery.  Recent blood pressure readings are as follows: Patient does not take blood pressure readings at home.   Recent blood glucose readings are as follows: Patient does not keep a log, but he does check it every morning before eating. This morning (05/29/2021) it was 142. Patient states it ranges from 110-150.  Annual wellness visit in last year? No - 02/17/2013 Most Recent BP reading: 126/72 on 05/05/2021  If Diabetic: Most recent A1C reading: 7.5 on 01/27/2021 Last eye exam / retinopathy screening: 01/13/2021 Last diabetic foot exam: 10/25/2020  Debbora Dus, CPP notified  Marijean Niemann,  [place] [phone]   Time Spent: 20 minutes  I have reviewed the care management and care coordination activities outlined in this encounter and I am certifying that I agree with the content of this note. No further action required.  Debbora Dus, PharmD Clinical Pharmacist [facility] at [facility] [phone]

## 2021-05-29 ENCOUNTER — Other Ambulatory Visit: Payer: Self-pay

## 2021-05-29 ENCOUNTER — Ambulatory Visit: Payer: Medicare HMO

## 2021-06-02 ENCOUNTER — Telehealth: Payer: Medicare HMO

## 2021-06-02 ENCOUNTER — Telehealth: Payer: Self-pay

## 2021-06-02 NOTE — Progress Notes (Addendum)
Chronic Care Management Pharmacy Assistant   Name: Tony Manning  MRN: [id number] DOB: 09-19-1960  Reason for Encounter: Diabetes Disease State  Recent office visits:  None since last CCM contact  Recent consult visits:  None since last CCM contact  Hospital visits:  None in previous 6 months  Medications: Outpatient Encounter Medications as of 06/02/2021  Medication Sig   amitriptyline (ELAVIL) 10 MG tablet Take 1 tablet (10 mg total) by mouth at bedtime as needed for sleep.   amLODipine (NORVASC) 10 MG tablet Take 1 tablet (10 mg total) by mouth daily.   aspirin 81 MG chewable tablet Chew 81 mg by mouth at bedtime.   atorvastatin (LIPITOR) 40 MG tablet Take 1 tablet (40 mg total) by mouth daily.   Blood Glucose Monitoring Suppl (ONETOUCH VERIO) w/Device KIT Use to check blood sugars TID. Dx: E11.9   carvedilol (COREG) 6.25 MG tablet TAKE 1 TABLET BY MOUTH TWICE A DAY   cyanocobalamin (,VITAMIN B-12,) 1000 MCG/ML injection Inject 1 mL (1,000 mcg total) into the muscle every 14 (fourteen) days.   cyclobenzaprine (FLEXERIL) 5 MG tablet Take 5 mg by mouth 3 (three) times daily as needed for muscle spasms.   diclofenac Sodium (VOLTAREN) 1 % GEL Apply 2-4 g topically 4 (four) times daily. (Patient taking differently: Apply 2-4 g topically 4 (four) times daily. PRN)   famotidine (PEPCID) 20 MG tablet Take 1 tablet (20 mg total) by mouth at bedtime.   glimepiride (AMARYL) 4 MG tablet Take 1 tablet (4 mg total) by mouth 2 (two) times daily.   glucose blood (ONETOUCH VERIO) test strip Use to check blood sugars TID. Dx: E11.9   Insulin NPH, Human,, Isophane, (NOVOLIN N FLEXPEN) 100 UNIT/ML Kiwkpen Inject 16 Units into the skin in the morning and at bedtime.   Insulin Pen Needle 32G X 4 MM MISC Use daily with insulin pen.   Insulin Syringe-Needle U-100 (INSULIN SYRINGE 1CC/31GX5/16") 31G X 5/16" 1 ML MISC Use with NPH insulin twice daily.   isosorbide mononitrate (IMDUR) 30 MG 24 hr  tablet Take 1 tablet (30 mg total) by mouth daily.   Lancets (ONETOUCH ULTRASOFT) lancets Use to check blood sugars TID. Dx: E11.9   losartan (COZAAR) 100 MG tablet TAKE ONE TABLET BY MOUTH EVERYDAY AT BEDTIME   metFORMIN (GLUCOPHAGE) 1000 MG tablet Take 0.5 tablets (500 mg total) by mouth 2 (two) times daily with a meal.   nitroGLYCERIN (NITROSTAT) 0.4 MG SL tablet Place 1 tablet (0.4 mg total) under the tongue every 5 (five) minutes as needed for chest pain.   ondansetron (ZOFRAN) 4 MG tablet Take 4 mg by mouth every 8 (eight) hours as needed.   oxyCODONE (OXY IR/ROXICODONE) 5 MG immediate release tablet Take 5 mg by mouth every 6 (six) hours as needed.   pantoprazole (PROTONIX) 40 MG tablet Take 1 tablet (40 mg total) by mouth daily.   Semaglutide,0.25 or 0.5MG /DOS, (OZEMPIC, 0.25 OR 0.5 MG/DOSE,) 2 MG/1.5ML SOPN Inject 0.5 mg into the skin once a week.   tamsulosin (FLOMAX) 0.4 MG CAPS capsule TAKE ONE TABLET BY MOUTH EVERY MORNING and TAKE ONE TABLET BY MOUTH EVERYDAY AT BEDTIME   No facility-administered encounter medications on file as of 06/02/2021.    Recent Relevant Labs: Lab Results  Component Value Date/Time   HGBA1C 7.5 (H) 01/27/2021 12:40 PM   HGBA1C 7.3 (A) 10/25/2020 08:20 AM   HGBA1C 7.6 (A) 07/11/2020 08:55 AM   HGBA1C 7.8 (H) 03/05/2020 02:46 PM  MICROALBUR 1.5 08/15/2012 09:05 AM    Kidney Function Lab Results  Component Value Date/Time   CREATININE 1.66 (H) 01/27/2021 12:40 PM   CREATININE 1.47 (H) 11/12/2020 10:52 AM   GFR 44.41 (L) 01/27/2021 12:40 PM   GFRNONAA 54 (L) 11/12/2020 10:52 AM   GFRAA 42 (L) 05/05/2019 05:22 AM   Contacted patient on to discuss diabetes disease state.   Current antihyperglycemic regimen:  Metformin 1000mg  tablets - Take 0.5 tablets (500 mg total) by mouth 2 (two) times daily with a meal. Semaglutide 0.25 or 0.5mg  - Inject 0.5 mg into the skin once a week Glimepiride 4mg  tablets - Take 1 tablet (4 mg total) by mouth 2 (two)  times daily. Novolin N Flexpen - Inject 16 units twice daily (pt taking as needed)   Patient verbally confirms he is taking the above medications as directed. Yes Patient stated he spoke with Debbora Dus last week and informed her he is needing Semaglutide 0.5mg . Patient states he ran out on Sunday 06/01/2021.  What diet changes have been made to improve diabetes control? Patient tries to stay active; Patient stated he does not watch what he eats.   What recent interventions/DTPs have been made to improve glycemic control:  He will decrease his insulin if his sugar continues to run low.  Recheck periodically.    Have there been any recent hospitalizations or ED visits since last visit with CPP? No  Patient denies hypoglycemic symptoms, including Pale, Sweaty, Shaky, Hungry, Nervous/irritable, and Vision changes  Patient denies hyperglycemic symptoms, including blurry vision, excessive thirst, fatigue, polyuria, and weakness  How often are you checking your blood sugar? once daily and in the morning before eating or drinking  What are your blood sugars ranging?  Fasting: Patient does keep a log, but was not at home to get it. He stated his sugar in the morning before eating or taking medication has been ranging around 150.  During the week, how often does your blood glucose drop below 70? Patient states once a week it will go under 70. Patient will eat candy or drink something sugary to get his glucose back up.   Are you checking your feet daily/regularly? No. Patient states he does not check his feet. States his feet and legs stay cold. States he wold like to take a blood circulation test. His right leg and foot get more cold then his left foot and leg.   Adherence Review: Is the patient currently on a STATIN medication? Yes Is the patient currently on ACE/ARB medication? Yes Does the patient have >5 day gap between last estimated fill dates? No  Care Gaps: Annual wellness visit in  last year? No 02/17/2013 Most recent A1C reading:  7.5 on 01/27/2021 Most Recent BP reading: 126/72 on 05/05/2021  Last eye exam / retinopathy screening: 01/13/2021 Last diabetic foot exam: 10/25/2020  Counseled patient on importance of annual eye and foot exam.   Star Rating Drugs:  Medication:  Last Fill: Day Supply Losartan 100mg  05/30/2021 30  Atorvastatin 40mg  05/30/2021 30 Metformin 500mg  05/30/2021 30  Glimepiride 4mg  05/30/2021 30  Ozempic 0.5 mg PAP - 05/21/2021  PCP appointment on 06/09/2021  Debbora Dus, CPP notified  Marijean Niemann, [place] Assistant [phone]   I have reviewed the care management and care coordination activities outlined in this encounter and I am certifying that I agree with the content of this note. Would like to increase his Ozempic so we stop his Novolin N. Will schedule OV  to discuss. Refill request was sent to [facility] last week. Will have team contact them to see if its been shipped.  Debbora Dus, PharmD Clinical Pharmacist [facility] at [facility] [phone]

## 2021-06-03 NOTE — Telephone Encounter (Addendum)
Contacted program for update on shipping of Ozempic 0.5 mg weekly. [facility] PAP is requesting an additional letter notifying change of address with PCP signature printed on company letter head. As we were not provided this information on original contact, they were willing to make exception and ship to [place] for this patient. All other patients will need letter faxed. Patient is out of medication as of 06/01/21. Medication (Ozempic 0.5 mg) should arrive at [facility] in 10-14 business day (120 day supply).    A new application and proof of income will be needed for 2023.  Debbora Dus, PharmD Clinical Pharmacist [facility] at [facility] [phone]

## 2021-06-03 NOTE — Progress Notes (Signed)
Spoke with patient. Informed him Ozempic would be in the office in 10-14 days and we would contact him when it arrived. Patient understood and thanked [place] for helping him.

## 2021-06-09 ENCOUNTER — Other Ambulatory Visit: Payer: Self-pay

## 2021-06-09 ENCOUNTER — Encounter: Payer: Self-pay | Admitting: Family Medicine

## 2021-06-09 ENCOUNTER — Ambulatory Visit (INDEPENDENT_AMBULATORY_CARE_PROVIDER_SITE_OTHER): Payer: Medicare HMO | Admitting: Family Medicine

## 2021-06-09 VITALS — BP 122/74 | HR 65 | Temp 98.2°F | Ht 70.0 in | Wt 200.0 lb

## 2021-06-09 DIAGNOSIS — I10 Essential (primary) hypertension: Secondary | ICD-10-CM

## 2021-06-09 DIAGNOSIS — Z794 Long term (current) use of insulin: Secondary | ICD-10-CM | POA: Diagnosis not present

## 2021-06-09 DIAGNOSIS — G4700 Insomnia, unspecified: Secondary | ICD-10-CM | POA: Diagnosis not present

## 2021-06-09 DIAGNOSIS — E538 Deficiency of other specified B group vitamins: Secondary | ICD-10-CM

## 2021-06-09 DIAGNOSIS — M5412 Radiculopathy, cervical region: Secondary | ICD-10-CM

## 2021-06-09 DIAGNOSIS — G4730 Sleep apnea, unspecified: Secondary | ICD-10-CM | POA: Diagnosis not present

## 2021-06-09 DIAGNOSIS — E1149 Type 2 diabetes mellitus with other diabetic neurological complication: Secondary | ICD-10-CM

## 2021-06-09 DIAGNOSIS — Z125 Encounter for screening for malignant neoplasm of prostate: Secondary | ICD-10-CM

## 2021-06-09 DIAGNOSIS — E7800 Pure hypercholesterolemia, unspecified: Secondary | ICD-10-CM | POA: Diagnosis not present

## 2021-06-09 DIAGNOSIS — Z0000 Encounter for general adult medical examination without abnormal findings: Secondary | ICD-10-CM | POA: Diagnosis not present

## 2021-06-09 DIAGNOSIS — Z7189 Other specified counseling: Secondary | ICD-10-CM

## 2021-06-09 LAB — COMPREHENSIVE METABOLIC PANEL
ALT: 23 U/L (ref 0–53)
AST: 18 U/L (ref 0–37)
Albumin: 4.3 g/dL (ref 3.5–5.2)
Alkaline Phosphatase: 90 U/L (ref 39–117)
BUN: 19 mg/dL (ref 6–23)
CO2: 24 mEq/L (ref 19–32)
Calcium: 9.6 mg/dL (ref 8.4–10.5)
Chloride: 108 mEq/L (ref 96–112)
Creatinine, Ser: 1.73 mg/dL — ABNORMAL HIGH (ref 0.40–1.50)
GFR: 42.15 mL/min — ABNORMAL LOW (ref >60.00)
Glucose, Bld: 179 mg/dL — ABNORMAL HIGH (ref 70–99)
Potassium: 4.1 mEq/L (ref 3.5–5.1)
Sodium: 142 mEq/L (ref 135–145)
Total Bilirubin: 0.5 mg/dL (ref 0.2–1.2)
Total Protein: 6.8 g/dL (ref 6.0–8.3)

## 2021-06-09 LAB — CBC WITH DIFFERENTIAL/PLATELET
Basophils Absolute: 0 10*3/uL (ref 0.0–0.1)
Basophils Relative: 0.6 % (ref 0.0–3.0)
Eosinophils Absolute: 0.4 10*3/uL (ref 0.0–0.7)
Eosinophils Relative: 4.9 % (ref 0.0–5.0)
HCT: 40.9 % (ref 39.0–52.0)
Hemoglobin: 13.7 g/dL (ref 13.0–17.0)
Lymphocytes Relative: 17.6 % (ref 12.0–46.0)
Lymphs Abs: 1.3 10*3/uL (ref 0.7–4.0)
MCHC: 33.5 g/dL (ref 30.0–36.0)
MCV: 89.6 fl (ref 78.0–100.0)
Monocytes Absolute: 0.5 10*3/uL (ref 0.1–1.0)
Monocytes Relative: 6.8 % (ref 3.0–12.0)
Neutro Abs: 5.1 10*3/uL (ref 1.4–7.7)
Neutrophils Relative %: 70.1 % (ref 43.0–77.0)
Platelets: 198 10*3/uL (ref 150.0–400.0)
RBC: 4.56 Mil/uL (ref 4.22–5.81)
RDW: 13.7 % (ref 11.5–15.5)
WBC: 7.2 10*3/uL (ref 4.0–10.5)

## 2021-06-09 LAB — LIPID PANEL
Cholesterol: 112 mg/dL (ref 0–200)
HDL: 40.2 mg/dL (ref >39.00)
LDL Cholesterol: 57 mg/dL (ref 0–99)
NonHDL: 71.81
Total CHOL/HDL Ratio: 3
Triglycerides: 73 mg/dL (ref 0.0–149.0)
VLDL: 14.6 mg/dL (ref 0.0–40.0)

## 2021-06-09 LAB — PSA, MEDICARE: PSA: 1.93 ng/ml (ref 0.10–4.00)

## 2021-06-09 LAB — HEMOGLOBIN A1C: Hgb A1c MFr Bld: 7.2 % — ABNORMAL HIGH (ref 4.6–6.5)

## 2021-06-09 LAB — VITAMIN B12: Vitamin B-12: 287 pg/mL (ref 211–911)

## 2021-06-09 MED ORDER — CYANOCOBALAMIN 1000 MCG/ML IJ SOLN
1000.0000 ug | Freq: Once | INTRAMUSCULAR | Status: AC
  Administered 2021-06-09: 1000 ug via INTRAMUSCULAR

## 2021-06-09 NOTE — Patient Instructions (Signed)
Go to the lab on the way out.   If you have mychart we'll likely use that to update you.    Take care.  Glad to see you. Plan on recheck in about 4 months with A1c at the visit.  Please schedule next B12 shot on the way out.

## 2021-06-09 NOTE — Progress Notes (Signed)
This visit occurred during the SARS-CoV-2 public health emergency.  Safety protocols were in place, including screening questions prior to the visit, additional usage of staff PPE, and extensive cleaning of exam room while observing appropriate contact time as indicated for disinfecting solutions.  I have personally reviewed the Medicare Annual Wellness questionnaire and have noted 1. The patient's medical and social history 2. Their use of alcohol, tobacco or illicit drugs 3. Their current medications and supplements 4. The patient's functional ability including ADL's, fall risks, home safety risks and hearing or visual             impairment. 5. Diet and physical activities 6. Evidence for depression or mood disorders  The patients weight, height, BMI have been recorded in the chart and visual acuity is per eye clinic.  I have made referrals, counseling and provided education to the patient based review of the above and I have provided the pt with a written personalized care plan for preventive services.  Provider list updated- see scanned forms.  Routine anticipatory guidance given to patient.  See health maintenance. The possibility exists that previously documented standard health maintenance information may have been brought forward from a previous encounter into this note.  If needed, that same information has been updated to reflect the current situation based on today's encounter.    Flu 2022 Shingles d/w pt.  PNA prev done Tetanus 2011 Covid prev done.   D/w patient SW:[id number] for colon cancer screening, including IFOB vs. colonoscopy.  Risks and benefits of both were discussed and patient voiced understanding.  Pt elects for: IFOB PSA pending 2022.   Advance directive d/w pt.  Wife would be designated if patient were incapacitated.   Cognitive function addressed- see scanned forms- and if abnormal then additional documentation follows.   In addition to [facility] Wellness, follow  up visit for the below conditions:  Diabetes:  Using medications without difficulties: see below.   Hypoglycemic episodes: no, see below.   Hyperglycemic episodes: no, see below.   Feet problems: some tingling at baseline.   Blood Sugars averaging: usually 100-150 recently.   eye exam within last year: yes He is going some days w/o insulin to prevent low sugars.  Usually taking about 4-5 doses of insulin in a week.  He ran out of ozempic about 2 weeks ago.  He is trying to get a refill in the meantime.  Still on metformin and glimepiride.    Injections from ortho didn't help and he is going to f/u with pain clinic re: neck pain.  I'll defer.  He agrees.    OSA on CPAP.  Still using nightly, compliant.  Sleeping better with that.    Hypertension:    Using medication without problems or lightheadedness:yes  Chest pain with exertion:no Edema:no Short of breath:no Labs pending.   Rare use of amitriptyline if needed for insomnia.    Elevated Cholesterol: Using medications without problems: see below.   Muscle aches: likely not from statin, more likely from ortho cuases.   Diet compliance: encouraged.   Exercise:encouraged  B12 def. Due for labs and dose today.  Compliant.    Granddaughter recently to ER with RSV, d/w pt.  At home now.  Support offered.  PMH and SH reviewed  Meds, vitals, and allergies reviewed.   ROS: Per HPI.  Unless specifically indicated otherwise in HPI, the patient denies:  General: fever. Eyes: acute vision changes ENT: sore throat Cardiovascular: chest pain Respiratory: SOB GI: vomiting GU:  dysuria Musculoskeletal: acute back pain Derm: acute rash Neuro: acute motor dysfunction Psych: worsening mood Endocrine: polydipsia Heme: bleeding Allergy: hayfever  GEN: nad, alert and oriented HEENT: ncat NECK: supple w/o LA CV: rrr. PULM: ctab, no inc wob ABD: soft, +bs EXT: no edema SKIN: no acute rash  Diabetic foot exam: Normal  inspection No skin breakdown No calluses  Normal DP pulses Dec sensation to light touch and monofilament Nails normal

## 2021-06-13 DIAGNOSIS — G4700 Insomnia, unspecified: Secondary | ICD-10-CM | POA: Insufficient documentation

## 2021-06-13 DIAGNOSIS — Z7189 Other specified counseling: Secondary | ICD-10-CM | POA: Insufficient documentation

## 2021-06-13 NOTE — Assessment & Plan Note (Signed)
He is going some days w/o insulin to prevent low sugars.  Usually taking about 4-5 doses of insulin in a week.  He ran out of ozempic about 2 weeks ago.  He is trying to get a refill in the meantime.  Still on metformin and glimepiride.  I think it makes sense to restart Ozempic in the meantime, check his labs today and go from there.  He agrees with plan.  Continue metformin glimepiride.

## 2021-06-13 NOTE — Assessment & Plan Note (Signed)
Continue atorvastatin.  See notes on labs.  Encourage work on diet and exercise.

## 2021-06-13 NOTE — Assessment & Plan Note (Signed)
Advance directive d/w pt.  Wife would be designated if patient were incapacitated.

## 2021-06-13 NOTE — Assessment & Plan Note (Signed)
  Rare use of amitriptyline if needed for insomnia.  Continue.  Use.

## 2021-06-13 NOTE — Assessment & Plan Note (Signed)
Continue amlodipine carvedilol isosorbide.  See notes on labs.  Continue work on diet and exercise.

## 2021-06-13 NOTE — Assessment & Plan Note (Signed)
Continue B12 replacement.  Dose given today after he had his labs collected.  See notes on labs.

## 2021-06-13 NOTE — Assessment & Plan Note (Signed)
Injections from ortho didn't help and he is going to f/u with pain clinic re: neck pain.  I'll defer.  He agrees.

## 2021-06-13 NOTE — Assessment & Plan Note (Signed)
OSA on CPAP.  Still using nightly, compliant.  Sleeping better with that.  Continue CPAP.

## 2021-06-13 NOTE — Assessment & Plan Note (Signed)
Flu 2022 Shingles d/w pt.  PNA prev done Tetanus 2011 Covid prev done.   D/w patient DO:DQVHQIT for colon cancer screening, including IFOB vs. colonoscopy.  Risks and benefits of both were discussed and patient voiced understanding.  Pt elects for: IFOB PSA pending 2022.   Advance directive d/w pt.  Wife would be designated if incapacitated.   Cognitive function addressed- see scanned forms- and if abnormal then additional documentation follows.

## 2021-06-16 DIAGNOSIS — M5030 Other cervical disc degeneration, unspecified cervical region: Secondary | ICD-10-CM | POA: Diagnosis not present

## 2021-06-16 DIAGNOSIS — Z79891 Long term (current) use of opiate analgesic: Secondary | ICD-10-CM | POA: Diagnosis not present

## 2021-06-16 DIAGNOSIS — Z79899 Other long term (current) drug therapy: Secondary | ICD-10-CM | POA: Diagnosis not present

## 2021-06-16 DIAGNOSIS — M545 Low back pain, unspecified: Secondary | ICD-10-CM | POA: Diagnosis not present

## 2021-06-16 DIAGNOSIS — G894 Chronic pain syndrome: Secondary | ICD-10-CM | POA: Diagnosis not present

## 2021-06-16 DIAGNOSIS — M4722 Other spondylosis with radiculopathy, cervical region: Secondary | ICD-10-CM | POA: Diagnosis not present

## 2021-06-19 ENCOUNTER — Other Ambulatory Visit: Payer: Self-pay | Admitting: Family Medicine

## 2021-06-19 ENCOUNTER — Other Ambulatory Visit: Payer: Self-pay | Admitting: Cardiology

## 2021-06-19 ENCOUNTER — Other Ambulatory Visit: Payer: Self-pay | Admitting: Student

## 2021-06-19 DIAGNOSIS — I25118 Atherosclerotic heart disease of native coronary artery with other forms of angina pectoris: Secondary | ICD-10-CM

## 2021-06-20 ENCOUNTER — Telehealth: Payer: Self-pay

## 2021-06-20 NOTE — Progress Notes (Addendum)
Chronic Care Management Pharmacy Assistant   Name: Tony Manning  MRN: [id number] DOB: [birth year]  Reason for Encounter: CCM (Medication Adherence and Delivery Coordination)   Recent office visits:  06/09/2021 - Tony Stain, MD - Patient presented for [facility]. Labs: CMP, A1c, CBC, Lipid, PSA and Vit B. Administered: cyanocobalamin ((VITAMIN B-12)) injection 1,000 mcg No medication changes.   Recent consult visits:  None since last CCM contact  Hospital visits:  None in previous 6 months  Medications: Outpatient Encounter Medications as of 06/20/2021  Medication Sig   amitriptyline (ELAVIL) 10 MG tablet Take 1 tablet (10 mg total) by mouth at bedtime as needed for sleep.   amLODipine (NORVASC) 10 MG tablet Take 1 tablet (10 mg total) by mouth daily.   aspirin 81 MG chewable tablet Chew 81 mg by mouth at bedtime.   atorvastatin (LIPITOR) 40 MG tablet Take 1 tablet (40 mg total) by mouth daily.   Blood Glucose Monitoring Suppl (ONETOUCH VERIO) w/Device KIT Use to check blood sugars TID. Dx: E11.9   carvedilol (COREG) 6.25 MG tablet TAKE ONE TABLET BY MOUTH TWICE DAILY   cyanocobalamin (,VITAMIN B-12,) 1000 MCG/ML injection Inject 1 mL (1,000 mcg total) into the muscle every 14 (fourteen) days.   cyclobenzaprine (FLEXERIL) 5 MG tablet Take 5 mg by mouth 3 (three) times daily as needed for muscle spasms.   diclofenac Sodium (VOLTAREN) 1 % GEL Apply 2-4 g topically 4 (four) times daily. (Patient taking differently: Apply 2-4 g topically 4 (four) times daily. PRN)   famotidine (PEPCID) 20 MG tablet Take 1 tablet (20 mg total) by mouth at bedtime.   glimepiride (AMARYL) 4 MG tablet Take 1 tablet (4 mg total) by mouth 2 (two) times daily.   glucose blood (ONETOUCH VERIO) test strip Use to check blood sugars TID. Dx: E11.9   Insulin NPH, Human,, Isophane, (NOVOLIN N FLEXPEN) 100 UNIT/ML Kiwkpen Inject 16 Units into the skin in the morning and at bedtime.   Insulin Pen  Needle 32G X 4 MM MISC Use daily with insulin pen.   Insulin Syringe-Needle U-100 (INSULIN SYRINGE 1CC/31GX5/16") 31G X 5/16" 1 ML MISC Use with NPH insulin twice daily.   isosorbide mononitrate (IMDUR) 30 MG 24 hr tablet TAKE ONE TABLET BY MOUTH EVERYDAY AT BEDTIME   Lancets (ONETOUCH ULTRASOFT) lancets Use to check blood sugars TID. Dx: E11.9   losartan (COZAAR) 100 MG tablet TAKE ONE TABLET BY MOUTH EVERYDAY AT BEDTIME   metFORMIN (GLUCOPHAGE) 1000 MG tablet Take 0.5 tablets (500 mg total) by mouth 2 (two) times daily with a meal.   nitroGLYCERIN (NITROSTAT) 0.4 MG SL tablet Place 1 tablet (0.4 mg total) under the tongue every 5 (five) minutes as needed for chest pain.   ondansetron (ZOFRAN) 4 MG tablet Take 4 mg by mouth every 8 (eight) hours as needed.   oxyCODONE (OXY IR/ROXICODONE) 5 MG immediate release tablet Take 5 mg by mouth every 6 (six) hours as needed.   pantoprazole (PROTONIX) 40 MG tablet Take 1 tablet (40 mg total) by mouth daily.   Semaglutide,0.25 or 0.5MG /DOS, (OZEMPIC, 0.25 OR 0.5 MG/DOSE,) 2 MG/1.5ML SOPN Inject 0.5 mg into the skin once a week.   tamsulosin (FLOMAX) 0.4 MG CAPS capsule TAKE ONE TABLET BY MOUTH EVERY MORNING and TAKE ONE TABLET BY MOUTH EVERYDAY AT BEDTIME   No facility-administered encounter medications on file as of 06/20/2021.   BP Readings from Last 3 Encounters:  06/09/21 122/74  05/05/21 126/72  01/27/21 124/80  Lab Results  Component Value Date   HGBA1C 7.2 (H) 06/09/2021     Recent OV, Consult or Hospital visit:  No medication changes indicated  Last adherence delivery date: 06/04/2021     Patient is due for next adherence delivery on: 07/03/2021  Multiple attempts made to reach patient. Unsuccessful outreach. Will refill based off of last adherence fill.   This delivery to include: Adherence Packaging  30 Days  Packs: Tamsulosin 0.4mg  2 capsule daily (1 breakfast, 1 bedtime)   Losartan 100mg - 1 tablet daily- (1  bedtime) Famotidine 20mg - 1 tablet daily (1 bedtime) Isosorbide 30mg - 1 tablet daily (1 bedtime) Carvedilol 6.25mg - 1 tablet twice daily (1 breakfast, 1 evening meal) Aspirin 81mg - 1 tablet daily (breakfast) Metformin 500mg  1 tablet twice daily (1 tablet  breakfast, 1 tablet evening meal) Amlodipine 10mg - 1 tablet daily (1 breakfast) Atorvastatin 40mg - 1 tablet daily (1 breakfast) Glimepiride 4mg - 1 tablet twice daily (1 breakfast, 1 evening meal) Pantoprazole 40 mg- 1 tablet daily (1 breakfast)  VIAL medications: None   Patient declined the following medications this month: Ozempic 0.5 mg weekly - gets through patient assistance Novolin N Flexpen 100 unit/mL (3 mL)- Inject 16 units twice daily (uses 3 days per week on average - unable to verify) Onetouch Verio test strips - PRN (unable to verify) Oxycodone-acetaminophen 5mg -325 - Take 1 tablet by mouth four times daily (no refills)  Refills requested from providers include:  Glimepiride 4mg - 1 tablet twice daily (1 breakfast, 1 evening meal) Pantoprazole 40 mg- 1 tablet daily (1 breakfast)  Delivery scheduled for 07/03/2021. Unable to speak with patient to confirm date.    Annual wellness visit in last year? No Most Recent BP reading: 122/74 on 06/09/2021  If Diabetic: Most recent A1C reading: 7.2 on 06/09/2021 Last eye exam / retinopathy screening: Up to date Last diabetic foot exam: Up to date  Tony Manning, CPP notified  Tony Manning, [facility] Assistant [phone]   I have reviewed the care management and care coordination activities outlined in this encounter and I am certifying that I agree with the content of this note. No further action required.  Tony Manning, PharmD Clinical Pharmacist [facility] at [facility] [phone]

## 2021-06-27 ENCOUNTER — Telehealth: Payer: Self-pay | Admitting: Family Medicine

## 2021-06-27 ENCOUNTER — Other Ambulatory Visit: Payer: Self-pay | Admitting: Family Medicine

## 2021-06-27 DIAGNOSIS — N189 Chronic kidney disease, unspecified: Secondary | ICD-10-CM

## 2021-06-27 NOTE — Telephone Encounter (Signed)
Spoke with patient about lab results. See result note.

## 2021-06-27 NOTE — Telephone Encounter (Signed)
Pt returning [place] call to go over labs

## 2021-07-02 ENCOUNTER — Encounter: Payer: Self-pay | Admitting: *Deleted

## 2021-07-04 ENCOUNTER — Telehealth: Payer: Self-pay

## 2021-07-04 NOTE — Telephone Encounter (Signed)
[name] - Client TELEPHONE ADVICE RECORD [facility] Patient Name: Tony Manning Gender: Male DOB: 10-11-1960 Age: 60 Y 13 M Return Phone Number: [phone] (Primary), [phone] (Secondary) Address: City/ State/ Zip: [place]  [address] Client [name] [name] - Client Client Site [name] Physician Tony Manning - MD Contact Type Call Who Is Calling Patient / Member / Family / Caregiver Call Type Triage / Clinical Relationship To Patient Self Return Phone Number [phone] (Secondary) Chief Complaint Leg Pain Reason for Call Symptomatic / Request for [place] states patient is having kidney, back and leg pain. Translation No Nurse Assessment Nurse: Rodney Cruise, RN, Hilliard Clark Date/Time (Eastern Time): 07/04/2021 11:55:37 AM Confirm and document reason for call. If symptomatic, describe symptoms. ---Caller states is having kidney, back pain, and leg pain. Pain has been happening for a long time. Back pain/kidney pain is a little worse than normal. Also has a rash on belt line that itches. Does the patient have any new or worsening symptoms? ---Yes Will a triage be completed? ---Yes Related visit to physician within the last 2 weeks? ---No Does the PT have any chronic conditions? (i.e. diabetes, asthma, this includes High risk factors for pregnancy, etc.) ---Yes List chronic conditions. ---Kidney concerns, back pain, Diabetes, Neuropathy, Heart concerns. Is this a behavioral health or substance abuse call? ---No Guidelines Guideline Title Affirmed Question Affirmed Notes Nurse Date/Time (Eastern Time) Flank Pain Diabetes mellitus or weak immune system (e.g., HIV positive, cancer chemo, splenectomy, organ transplant, chronic steroids) (Exception: mild pain Baxter, RN, Hilliard Clark 07/04/2021 12:00:07 PM PLEASE NOTE: All timestamps contained within  this report are represented as [place] Standard Time. CONFIDENTIALTY NOTICE: This fax transmission is intended only for the addressee. It contains information that is legally privileged, confidential or otherwise protected from use or disclosure. If you are not the intended recipient, you are strictly prohibited from reviewing, disclosing, copying using or disseminating any of this information or taking any action in reliance on or regarding this information. If you have received this fax in error, please notify us immediately by telephone so that we can arrange for its return to [place]. Phone: [phone], Toll-Free: [phone], Fax: [phone] Page: 2 of 2 Call Id: [id number] Guidelines Guideline Title Affirmed Question Affirmed Notes Nurse Date/Time Eilene Ghazi Time) that is only present with movement) Disp. Time Eilene Ghazi Time) Disposition Final User 07/04/2021 12:04:13 PM See PCP within 24 Hours Yes Baxter, RN, Hilliard Clark Caller Disagree/Comply Comply Caller Understands Yes PreDisposition Did not know what to do Care Advice Given Per Guideline SEE PCP WITHIN 24 HOURS: CALL BACK IF: * You become worse CARE ADVICE given per Flank Pain (Adult) guideline. Referrals REFERRED TO PCP OFFIC

## 2021-07-04 NOTE — Telephone Encounter (Signed)
I spoke with pt; pt said he had spoken to kidney dr and could be  1 - 2 wks before could get an appt with them. Pt said he already has appt with Romilda Garret NP on 07/07/21 at 8:40. Pt does not want to go to UC; pt said not really a lot worse but is still bothering pt. UC & ED precautions given and pt voiced understanding. Sending note to Dr [name] as PCP who is out of office and Romilda Garret NP who is out of office and Anastasiya CMA.

## 2021-07-04 NOTE — Telephone Encounter (Signed)
Thanks for checking with patient. I think it makes sense to keep the appointment as scheduled but get seen sooner at [facility] if needed.

## 2021-07-07 ENCOUNTER — Ambulatory Visit: Payer: Medicare HMO | Admitting: Nurse Practitioner

## 2021-07-10 ENCOUNTER — Telehealth: Payer: Self-pay

## 2021-07-10 NOTE — Progress Notes (Signed)
Chronic Care Management Pharmacy Assistant   Name: Tony Manning  MRN: [id number] DOB: [birth year]  Reason for Encounter: CCM (Appointment Reminder)  Medications: Outpatient Encounter Medications as of 07/10/2021  Medication Sig   amitriptyline (ELAVIL) 10 MG tablet Take 1 tablet (10 mg total) by mouth at bedtime as needed for sleep.   amLODipine (NORVASC) 10 MG tablet Take 1 tablet (10 mg total) by mouth daily.   aspirin 81 MG chewable tablet Chew 81 mg by mouth at bedtime.   atorvastatin (LIPITOR) 40 MG tablet Take 1 tablet (40 mg total) by mouth daily.   Blood Glucose Monitoring Suppl (ONETOUCH VERIO) w/Device KIT Use to check blood sugars TID. Dx: E11.9   carvedilol (COREG) 6.25 MG tablet TAKE ONE TABLET BY MOUTH TWICE DAILY   cyanocobalamin (,VITAMIN B-12,) 1000 MCG/ML injection Inject 1 mL (1,000 mcg total) into the muscle every 14 (fourteen) days.   cyclobenzaprine (FLEXERIL) 5 MG tablet Take 5 mg by mouth 3 (three) times daily as needed for muscle spasms.   diclofenac Sodium (VOLTAREN) 1 % GEL Apply 2-4 g topically 4 (four) times daily. (Patient taking differently: Apply 2-4 g topically 4 (four) times daily. PRN)   famotidine (PEPCID) 20 MG tablet Take 1 tablet (20 mg total) by mouth at bedtime.   glimepiride (AMARYL) 4 MG tablet TAKE ONE TABLET BY MOUTH TWICE DAILY   glucose blood (ONETOUCH VERIO) test strip Use to check blood sugars TID. Dx: E11.9   Insulin NPH, Human,, Isophane, (NOVOLIN N FLEXPEN) 100 UNIT/ML Kiwkpen Inject 16 Units into the skin in the morning and at bedtime.   Insulin Pen Needle 32G X 4 MM MISC Use daily with insulin pen.   Insulin Syringe-Needle U-100 (INSULIN SYRINGE 1CC/31GX5/16") 31G X 5/16" 1 ML MISC Use with NPH insulin twice daily.   isosorbide mononitrate (IMDUR) 30 MG 24 hr tablet TAKE ONE TABLET BY MOUTH EVERYDAY AT BEDTIME   Lancets (ONETOUCH ULTRASOFT) lancets Use to check blood sugars TID. Dx: E11.9   losartan (COZAAR) 100 MG tablet TAKE  ONE TABLET BY MOUTH EVERYDAY AT BEDTIME   metFORMIN (GLUCOPHAGE) 1000 MG tablet Take 0.5 tablets (500 mg total) by mouth 2 (two) times daily with a meal.   nitroGLYCERIN (NITROSTAT) 0.4 MG SL tablet Place 1 tablet (0.4 mg total) under the tongue every 5 (five) minutes as needed for chest pain.   ondansetron (ZOFRAN) 4 MG tablet Take 4 mg by mouth every 8 (eight) hours as needed.   oxyCODONE (OXY IR/ROXICODONE) 5 MG immediate release tablet Take 5 mg by mouth every 6 (six) hours as needed.   pantoprazole (PROTONIX) 40 MG tablet TAKE ONE TABLET BY MOUTH EVERY MORNING   Semaglutide,0.25 or 0.5MG /DOS, (OZEMPIC, 0.25 OR 0.5 MG/DOSE,) 2 MG/1.5ML SOPN Inject 0.5 mg into the skin once a week.   tamsulosin (FLOMAX) 0.4 MG CAPS capsule TAKE ONE TABLET BY MOUTH EVERY MORNING and TAKE ONE TABLET BY MOUTH EVERYDAY AT BEDTIME   No facility-administered encounter medications on file as of 07/10/2021.    Message was left for Tony Manning to remind him of his upcoming telephone visit with Tony Manning on 07/15/2021 at 2:00. Patient was reminded to have all medications, supplements and any blood glucose and blood pressure readings available for review at appointment.  Star Rating Drugs: Medication:  Last Fill: Day Supply Losartan 100mg  06/27/2021      30         Atorvastatin 40mg  06/27/2021      30 Metformin  $'500mg'j$  06/27/2021      30         Glimepiride $RemoveBefo'4mg'hSgsAuNrJKb$  06/27/2021      30         Ozempic 0.5 mg           PAP  Tony Manning, CPP notified  Tony Manning, [place] Clinical Pharmacy Assistant [phone]  Time Spent: 10 Minutes

## 2021-07-14 DIAGNOSIS — M4722 Other spondylosis with radiculopathy, cervical region: Secondary | ICD-10-CM | POA: Diagnosis not present

## 2021-07-14 DIAGNOSIS — M545 Low back pain, unspecified: Secondary | ICD-10-CM | POA: Diagnosis not present

## 2021-07-14 DIAGNOSIS — M5030 Other cervical disc degeneration, unspecified cervical region: Secondary | ICD-10-CM | POA: Diagnosis not present

## 2021-07-14 DIAGNOSIS — G894 Chronic pain syndrome: Secondary | ICD-10-CM | POA: Diagnosis not present

## 2021-07-15 ENCOUNTER — Ambulatory Visit (INDEPENDENT_AMBULATORY_CARE_PROVIDER_SITE_OTHER): Payer: Medicare HMO

## 2021-07-15 ENCOUNTER — Telehealth: Payer: Self-pay

## 2021-07-15 ENCOUNTER — Other Ambulatory Visit: Payer: Self-pay

## 2021-07-15 DIAGNOSIS — E7800 Pure hypercholesterolemia, unspecified: Secondary | ICD-10-CM

## 2021-07-15 DIAGNOSIS — M7061 Trochanteric bursitis, right hip: Secondary | ICD-10-CM | POA: Diagnosis not present

## 2021-07-15 DIAGNOSIS — E1149 Type 2 diabetes mellitus with other diabetic neurological complication: Secondary | ICD-10-CM

## 2021-07-15 DIAGNOSIS — M7062 Trochanteric bursitis, left hip: Secondary | ICD-10-CM | POA: Diagnosis not present

## 2021-07-15 DIAGNOSIS — I10 Essential (primary) hypertension: Secondary | ICD-10-CM

## 2021-07-15 MED ORDER — SEMAGLUTIDE (1 MG/DOSE) 4 MG/3ML ~~LOC~~ SOPN: 1.0000 mg | PEN_INJECTOR | SUBCUTANEOUS | Status: DC

## 2021-07-15 NOTE — Telephone Encounter (Signed)
Patient's enrollment for Ozempic through [employer] assistance program ends 08/06/21. Please start re-enrollment process. Dose was increased to 1 mg today.  Also, is patient actively enrolled in PAP for his insulin?  Thanks,  Debbora Dus, PharmD Clinical Pharmacist [facility] at [facility] [phone]

## 2021-07-15 NOTE — Patient Instructions (Signed)
Dear Tony Manning,  Below is a summary of the goals we discussed during our follow up appointment on July 15, 2021. Please contact me anytime with questions or concerns.   Visit Information  Patient Care Plan: CCM Pharmacy Care Plan     Problem Identified: CHL AMB "PATIENT-SPECIFIC PROBLEM"   Note:     Current Barriers:  CKD worsening with sub-optimal regimen for diabetes  Pharmacist Clinical Goal(s):  Patient will adhere to plan to optimize therapeutic regimen for diabetes as evidenced by report of adherence to recommended medication management changes through collaboration with PharmD and provider.   Interventions: 1:1 collaboration with Tonia Ghent, MD regarding development and update of comprehensive plan of care as evidenced by provider attestation and co-signature Inter-disciplinary care team collaboration (see longitudinal plan of care) Comprehensive medication review performed; medication list updated in electronic medical record  Hypertension (BP goal <130/80) -Controlled - per clinic readings  -Current treatment: Amlodipine 10 mg - 1 tablet daily (morning) Carvedilol 6.25 mg - 1 tablet twice daily (morning and bedtime) Losartan 100 mg - 1 tablet daily (bedtime) -Medications previously tried: lisinopril  - cough -Current home readings: none reported, has not checked at home recently -Continue to monitor BP at home daily, document, and provide log at future appointments -Recommended to continue current medication  Hyperlipidemia: (LDL goal < 70) -Controlled - LDL 59 -Current treatment: Atorvastatin 40 mg - 1 tablet daily (morning) -Medications previously tried: none  -Recommended to continue current medication  Diabetes (A1c goal <7%) -Uncontrolled - A1c 7.2% 06/2021 -07/15/21 - He has been back on Ozempic 0.5 mg weekly for past 3 weeks. He has less hypoglycemia since starting Ozempic. -Cost: Ozempic through PAP. Novolin cost $47 for 30 day supply.   -Current medications: Glimepiride 4 mg - 1 tablet twice daily Insulin NPH Flexpen (Novolin N) - Inject 16 units into skin BID before meals  Metformin 1000 mg - 1/2 tablet BID with meals Ozempic 0.5 mg - Inject 0.5 mg weekly  -Medications previously tried: none reported -Adherence: self-adjusts Novolin N; If BG > 170 takes 20 units in morning, if < 170 takes 16 units) takes 4-5 days per week once per day. He does not take it as prescribed due to low BG. -Current home glucose readings - checking daily, does not like finger sticks but CGM not covered - Blood sugar readings are as follows: - Fasting:  07/14/21 - 108 (no insulin)      07/15/21 - 118 (no insulin) -Recommended increase Ozempic to 1 mg weekly with goal of reducing insulin use. Consider SGLT-2 in future.  Other  - patient due for B12 injection, he would like me to send note to scheduling team - Vitamin D has not been updated since low. Recommend updating with next labs.  Patient Goals/Self-Care Activities Patient will:  - check glucose twice daily, document, and provide at future appointments   Follow Up Plan: CCM visit - 30 days     Patient verbalizes understanding of instructions provided today and agrees to view in [place].   Debbora Dus, PharmD Clinical Pharmacist [facility] at [facility] [phone]

## 2021-07-15 NOTE — Telephone Encounter (Signed)
Patient would like a call to schedule B12 injection.

## 2021-07-15 NOTE — Progress Notes (Signed)
Chronic Care Management Pharmacy Note  Name:  Tony Manning   MRN :  [id number] DOB:  10-19-1960  07/15/2021  Summary: We discussed diabetes. His BG varies but has improved with Ozempic 0.5 mg. BG 109 and 119 yesterday and today fasting. He has not taken insulin the past 2 days. He takes his insulin sliding scale depending on his glucose level 4-5 days per week. Continues glimepiride and Metformin 1000 mg/day. With his CKD, would like to re-trial SGLT through PAP but pt reports his income may change and likely wont be eligible. For now we discussed trial of Ozempic 1 mg in order to wean further off insulin.   Recommendations: Increase Ozempic from 0.5 mg to 1 mg weekly (will send to [employer])  Plan: CCM follow up 1 month  Subjective: Tony Manning is an 60 y.o. year old male who is a primary patient of [name], Elveria Rising, MD.  The CCM team was consulted for assistance with disease management and care coordination needs.    Engaged with patient by telephone for follow up visit in response to provider referral for pharmacy case management and/or care coordination services.   Consent to Services:  The patient was given information about Chronic Care Management services, agreed to services, and gave verbal consent prior to initiation of services.  Please see initial visit note for detailed documentation.   Patient Care Team: Tonia Ghent, MD as PCP - General (Family Medicine) Adrian Prows, MD as Consulting Physician (Cardiology) Debbora Dus, [facility] as Pharmacist (Pharmacist)  Recent office visits: 06/09/2021 - [name], MD - [facility]. Labs: CMP, A1c, CBC, Lipid, PSA and Vit B. Foot exam completed. Ran out of Ozempic, will restart. A1c stable 7.2%, Kidney function declined, refer to renal clinic. Administered: cyanocobalamin ((VITAMIN B-12)) injection 1,000 mcg. No medication changes. Follow up 3 months.    Recent consult visits:  None  since last CCM contact   Hospital visits:  None in previous 6 months  Objective:  Lab Results  Component Value Date   CREATININE 1.73 (H) 06/09/2021   BUN 19 06/09/2021   GFR 42.15 (L) 06/09/2021   GFRNONAA 54 (L) 11/12/2020   GFRAA 42 (L) 05/05/2019   NA 142 06/09/2021   K 4.1 06/09/2021   CALCIUM 9.6 06/09/2021   CO2 24 06/09/2021   GLUCOSE 179 (H) 06/09/2021    Lab Results  Component Value Date/Time   HGBA1C 7.2 (H) 06/09/2021 09:23 AM   HGBA1C 7.5 (H) 01/27/2021 12:40 PM   GFR 42.15 (L) 06/09/2021 09:23 AM   GFR 44.41 (L) 01/27/2021 12:40 PM   MICROALBUR 1.5 08/15/2012 09:05 AM    Last diabetic Eye exam:  Lab Results  Component Value Date/Time   HMDIABEYEEXA Retinopathy (A) 01/13/2021 12:00 AM    Last diabetic Foot exam:  10/25/20 normal   Lab Results  Component Value Date   CHOL 112 06/09/2021   HDL 40.20 06/09/2021   LDLCALC 57 06/09/2021   TRIG 73.0 06/09/2021   CHOLHDL 3 06/09/2021    Hepatic Function Latest Ref Rng & Units 06/09/2021 01/27/2021 03/05/2020  Total Protein 6.0 - 8.3 g/dL 6.8 7.5 7.5  Albumin 3.5 - 5.2 g/dL 4.3 4.8 4.6  AST 0 - 37 U/L $Remo'18 20 20  'wydUB$ ALT 0 - 53 U/L $Remo'23 21 21  'czOqa$ Alk Phosphatase 39 - 117 U/L 90 78 67  Total Bilirubin 0.2 - 1.2 mg/dL 0.5 0.6 0.5  Bilirubin, Direct 0.0 - 0.3 mg/dL -  0.1 -    Lab Results  Component Value Date/Time   TSH 1.34 01/27/2021 12:40 PM   TSH 2.22 03/21/2018 12:05 PM    CBC Latest Ref Rng & Units 06/09/2021 01/27/2021 11/12/2020  WBC 4.0 - 10.5 K/uL 7.2 7.9 6.8  Hemoglobin 13.0 - 17.0 g/dL 13.7 14.0 13.3  Hematocrit 39.0 - 52.0 % 40.9 40.6 37.9(L)  Platelets 150.0 - 400.0 K/uL 198.0 223.0 215    Lab Results  Component Value Date/Time   VD25OH 19.37 (L) 03/21/2018 12:05 PM    Clinical ASCVD: Yes  The ASCVD Risk score (Arnett DK, et al., 2019) failed to calculate for the following reasons:   The patient has a prior MI or stroke diagnosis    Depression screen [facility] 2/9 09/20/2020 04/21/2018 10/11/2017   Decreased Interest 0 0 0  Down, Depressed, Hopeless 0 0 0  PHQ - 2 Score 0 0 0  Altered sleeping 0 - -  Tired, decreased energy 0 - -  Change in appetite 0 - -  Feeling bad or failure about yourself  0 - -  Trouble concentrating 0 - -  Moving slowly or fidgety/restless 0 - -  Suicidal thoughts 0 - -  PHQ-9 Score 0 - -  Difficult doing work/chores Not difficult at all - -  Some recent data might be hidden    Social History   Tobacco Use  Smoking Status Never  Smokeless Tobacco Former   Types: Chew   Quit date: 02/06/2016   BP Readings from Last 3 Encounters:  06/09/21 122/74  05/05/21 126/72  01/27/21 124/80   Pulse Readings from Last 3 Encounters:  06/09/21 65  05/05/21 65  01/27/21 62   Wt Readings from Last 3 Encounters:  06/09/21 200 lb (90.7 kg)  05/05/21 202 lb (91.6 kg)  03/20/21 212 lb 9.6 oz (96.4 kg)   BMI Readings from Last 3 Encounters:  06/09/21 28.70 kg/m  05/05/21 28.98 kg/m  03/20/21 30.50 kg/m    Assessment/Interventions: Review of patient past medical history, allergies, medications, health status, including review of consultants reports, laboratory and other test data, was performed as part of comprehensive evaluation and provision of chronic care management services.   SDOH:  (Social Determinants of Health) assessments and interventions performed: Yes    CCM Care Plan  Allergies  Allergen Reactions   Ambien [Zolpidem] Other (See Comments)    Parasomnias, sleep walking   Lisinopril Cough   Tramadol Diarrhea    Medications Reviewed Today     Reviewed by Debbora Dus, [facility] (Pharmacist) on 07/15/21 at [place] List Status: <None>   Medication Order Taking? Sig Documenting Provider Last Dose Status Informant  amitriptyline (ELAVIL) 10 MG tablet [id number] Yes Take 1 tablet (10 mg total) by mouth at bedtime as needed for sleep. Tonia Ghent, MD Taking Active   amLODipine (NORVASC) 10 MG tablet [id number] Yes Take 1 tablet (10 mg  total) by mouth daily. Cantwell, Celeste C, PA-C Taking Active   aspirin 81 MG chewable tablet [id number] Yes Chew 81 mg by mouth at bedtime. [provider] Taking Active Self  atorvastatin (LIPITOR) 40 MG tablet [id number] Yes Take 1 tablet (40 mg total) by mouth daily. Tonia Ghent, MD Taking Active   Blood Glucose Monitoring Suppl [facility] VERIO) w/Device Drucie Opitz [id number] Yes Use to check blood sugars TID. Dx: E11.9 Tonia Ghent, MD Taking Active   carvedilol (COREG) 6.25 MG tablet [id number] Yes TAKE ONE TABLET BY MOUTH TWICE DAILY Adrian Prows,  MD Taking Active   cyanocobalamin (,VITAMIN B-12,) 1000 MCG/ML injection [id number] Yes Inject 1 mL (1,000 mcg total) into the muscle every 14 (fourteen) days. Tonia Ghent, MD Taking Active   cyclobenzaprine (FLEXERIL) 5 MG tablet [id number] Yes Take 5 mg by mouth 3 (three) times daily as needed for muscle spasms. [provider] Taking Active   diclofenac Sodium (VOLTAREN) 1 % GEL [id number] No Apply 2-4 g topically 4 (four) times daily.  Patient not taking: Reported on 07/15/2021   Tonia Ghent, MD Not Taking Active   famotidine (PEPCID) 20 MG tablet [id number] Yes Take 1 tablet (20 mg total) by mouth at bedtime. Parrett, Fonnie Mu, NP Taking Active   glimepiride (AMARYL) 4 MG tablet [id number] Yes TAKE ONE TABLET BY MOUTH TWICE DAILY Tonia Ghent, MD Taking Active   glucose blood [facility] VERIO) test strip [id number] Yes Use to check blood sugars TID. Dx: E11.9 Tonia Ghent, MD Taking Active   Insulin NPH, Human,, Isophane, (NOVOLIN N FLEXPEN) 100 UNIT/ML Mayer Masker [id number] Yes Inject 16 Units into the skin in the morning and at bedtime. Tonia Ghent, MD Taking Active   Insulin Pen Needle 32G X 4 MM MISC [id number] Yes Use daily with insulin pen. Tonia Ghent, MD Taking Active Self  Insulin Syringe-Needle U-100 (INSULIN SYRINGE 1CC/31GX5/16") 31G X 5/16" 1 ML MISC [id number] Yes Use with NPH insulin twice daily. Tonia Ghent, MD Taking Active   isosorbide mononitrate (IMDUR) 30 MG 24 hr tablet [id number] Yes TAKE ONE TABLET BY MOUTH EVERYDAY AT BEDTIME Adrian Prows, MD Taking Active   Lancets [facility] ULTRASOFT) lancets [id number] Yes Use to check blood sugars TID. Dx: E11.9 Tonia Ghent, MD Taking Active   losartan (COZAAR) 100 MG tablet [id number] Yes TAKE ONE TABLET BY MOUTH EVERYDAY AT BEDTIME Adrian Prows, MD Taking Active   metFORMIN (GLUCOPHAGE) 1000 MG tablet [id number] Yes Take 0.5 tablets (500 mg total) by mouth 2 (two) times daily with a meal. Tonia Ghent, MD Taking Active   nitroGLYCERIN (NITROSTAT) 0.4 MG SL tablet [id number] Yes Place 1 tablet (0.4 mg total) under the tongue every 5 (five) minutes as needed for chest pain. Miquel Dunn, NP Taking Active   ondansetron [facility]) 4 MG tablet [id number] Yes Take 4 mg by mouth every 8 (eight) hours as needed. [provider] Taking Active   oxyCODONE (OXY IR/ROXICODONE) 5 MG immediate release tablet [id number] Yes Take 5 mg by mouth every 6 (six) hours as needed. [provider] Taking Active   pantoprazole (PROTONIX) 40 MG tablet [id number] Yes TAKE ONE TABLET BY MOUTH EVERY MORNING Tonia Ghent, MD Taking Active   Semaglutide,0.25 or 0.5MG /DOS, (OZEMPIC, 0.25 OR 0.5 MG/DOSE,) 2 MG/1.5ML SOPN [id number] Yes Inject 0.5 mg into the skin once a week. Tonia Ghent, MD Taking Active   tamsulosin [facility]) 0.4 MG CAPS capsule [id number] Yes TAKE ONE TABLET BY MOUTH EVERY MORNING and TAKE ONE TABLET BY MOUTH EVERYDAY AT BEDTIME Tonia Ghent, MD Taking Active             Patient Active Problem List   Diagnosis Date Noted   Advance care planning 06/13/2021   Insomnia 06/13/2021   B12 deficiency 01/30/2021   Fatigue 01/29/2021   Abdominal discomfort 01/05/2021   Dyspnea 11/12/2020   Sarcoidosis 11/12/2020   H/O colonoscopy 10/27/2020   Nocturia 05/03/2020   Right sided weakness 03/06/2020   Low back pain 01/29/2020    Facial weakness 11/16/2019  Hematuria 03/22/2018   Renal stones 03/22/2018   Vitamin D deficiency, unspecified 10/04/2017   Chronic neck pain (Primary Area of Pain) (B) (L>R) 09/21/2017   Chronic upper extremity pain (Secondary Area of Pain) (Bilateral) (L>R) 09/21/2017   Chronic left shoulder pain (Tertiary Area of Pain) 09/21/2017   Chronic low back pain (Fourth Area of Pain) (B) (R>L) 09/21/2017   Chronic pain of lower extremity (B) (R>L) 09/21/2017   Other long term (current) drug therapy 09/21/2017   Other specified health status 09/21/2017   Other chronic pain 08/22/2017   Other fatigue 05/10/2017   Radiculopathy 04/07/2017   Angina pectoris ([place]) 10/15/2016   Cervical radiculitis 07/23/2016   Dysphagia 06/12/2016   Sciatica 02/06/2016   Plantar fasciitis 01/16/2016   Right sided sciatica 05/16/2015   Hand pain 04/05/2015   Premature ejaculation 09/17/2014   Heel pain 09/17/2014   NSTEMI (non-ST elevated myocardial infarction) ([place]) 06/13/2014   Atypical chest pain 06/06/2014   Memory change 05/24/2014   Benign paroxysmal positional vertigo 07/13/2013   Orthostasis 07/13/2013   Migraine, unspecified, without mention of intractable migraine without mention of status migrainosus 04/03/2013   Medicare annual wellness visit, subsequent 02/17/2013   Pain in joint, ankle and foot 10/19/2012   Irritable mood 01/18/2012   Tobacco abuse 12/21/2011   Neck pain 12/03/2010   Hearing loss 12/03/2010   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   HYPERCHOLESTEROLEMIA 10/20/2010   DEPRESSION 10/20/2010   Osteoarthritis 10/20/2010   ARTHRITIS 10/20/2010   NEPHROLITHIASIS, HX OF 10/20/2010   ESSENTIAL HYPERTENSION, BENIGN 09/30/2010   CORONARY ATHEROSCLEROSIS NATIVE CORONARY ARTERY 09/30/2010   Sleep apnea 09/30/2010   CHEST PAIN UNSPECIFIED 09/30/2010   PERCUTANEOUS TRANSLUMINAL CORONARY ANGIOPLASTY, HX OF 09/30/2010    Immunization History   Administered Date(s) Administered   Influenza Inj Mdck Quad Pf 06/02/2018   Influenza Whole 09/17/2010   Influenza,inj,Quad PF,6+ Mos 06/08/2013, 05/22/2014, 05/12/2016, 05/08/2019, 05/27/2020   Influenza-Unspecified 06/08/2013, 05/22/2014, 06/08/2015, 05/12/2016, 05/21/2017, 06/02/2018, 05/09/2019, 05/21/2021   PFIZER(Purple Top)SARS-COV-2 Vaccination 11/27/2019, 12/25/2019, 08/07/2020, 05/21/2021   Pneumococcal Polysaccharide-23 09/08/2007, 02/16/2013   Td 09/07/2009    Conditions to be addressed/monitored:  Hypertension, Hyperlipidemia, and Diabetes  Care Plan : [place]  Updates made by Debbora Dus, [place] since 07/15/2021 12:00 AM     Problem: CHL AMB "PATIENT-SPECIFIC PROBLEM"   Note:     Current Barriers:  CKD worsening with sub-optimal regimen for diabetes  Pharmacist Clinical Goal(s):  Patient will adhere to plan to optimize therapeutic regimen for diabetes as evidenced by report of adherence to recommended medication management changes through collaboration with PharmD and provider.   Interventions: 1:1 collaboration with Tonia Ghent, MD regarding development and update of comprehensive plan of care as evidenced by provider attestation and co-signature Inter-disciplinary care team collaboration (see longitudinal plan of care) Comprehensive medication review performed; medication list updated in electronic medical record  Hypertension (BP goal <130/80) -Controlled - per clinic readings  -Current treatment: Amlodipine 10 mg - 1 tablet daily (morning) Carvedilol 6.25 mg - 1 tablet twice daily (morning and bedtime) Losartan 100 mg - 1 tablet daily (bedtime) -Medications previously tried: lisinopril  - cough -Current home readings: none reported, has not checked at home recently -Continue to monitor BP at home daily, document, and provide log at future appointments -Recommended to continue current medication  Hyperlipidemia: (LDL goal <  70) -Controlled - LDL 59 -Current treatment: Atorvastatin 40 mg - 1 tablet daily (morning) -Medications previously tried: none  -Recommended  to continue current medication  Diabetes (A1c goal <7%) -Uncontrolled - A1c 7.2% 06/2021 -07/15/21 - He has been back on Ozempic 0.5 mg weekly for past 3 weeks. He has less hypoglycemia since starting Ozempic. -Cost: Ozempic through PAP. Novolin cost $47 for 30 day supply.  -Current medications: Glimepiride 4 mg - 1 tablet twice daily Insulin NPH Flexpen (Novolin N) - Inject 16 units into skin BID before meals  Metformin 1000 mg - 1/2 tablet BID with meals Ozempic 0.5 mg - Inject 0.5 mg weekly  -Medications previously tried: none reported -Adherence: self-adjusts Novolin N; If BG > 170 takes 20 units in morning, if < 170 takes 16 units) takes 4-5 days per week once per day. He does not take it as prescribed due to low BG. -Current home glucose readings - checking daily, does not like finger sticks but CGM not covered - Blood sugar readings are as follows: - Fasting:  07/14/21 - 108 (no insulin)      07/15/21 - 118 (no insulin) -Recommended increase Ozempic to 1 mg weekly with goal of reducing insulin use. Consider SGLT-2 in future.  Other  - patient due for B12 injection, he would like me to send note to scheduling team - Vitamin D has not been updated since low. Recommend updating with next labs.  Patient Goals/Self-Care Activities Patient will:  - check glucose twice daily, document, and provide at future appointments   Follow Up Plan: CCM visit - 30 days      Medication Assistance: Application for Ozempic medication assistance program approved through 08/06/2021.   Patient's preferred pharmacy is: [facility] - [place], [place] - [address] [place] [address] Phone: [phone] Fax: [phone]  No concerns with pill packs.  Packs, 30 DS: Aspirin 81 mg - 1 tablet daily  (1 breakfast)  Amlodipine 10 mg- 1 tablet daily (1 breakfast) Atorvastatin 40 mg- 1 tablet daily (1 breakfast) Carvedilol 6.25 mg- 1 tablet twice daily (1 breakfast, 1 evening meal) Famotidine 20 mg- 1 tablet daily (1 bedtime) Glimepiride 4 mg- 1 tablet twice daily (1 breakfast, 1 evening meal) Isosorbide mononitrate 30 mg- 1 tablet daily (1 bedtime) Losartan 100 mg- 1 tablet daily- (1 bedtime) Metformin 1000 mg- 1/2 tablet twice daily (1 breakfast, 1 evening meal) Pantoprazole 40 mg- 1 tablet daily (1 breakfast) Tamsulosin 0.4 mg- 2 capsule daily (1 breakfast, 1 bedtime)    Care Plan and Follow Up Patient Decision:  Patient agrees to Care Plan and Follow-up.  Debbora Dus, PharmD Clinical Pharmacist [facility] [phone]

## 2021-07-16 ENCOUNTER — Other Ambulatory Visit: Payer: Self-pay | Admitting: Family Medicine

## 2021-07-16 DIAGNOSIS — E559 Vitamin D deficiency, unspecified: Secondary | ICD-10-CM

## 2021-07-16 NOTE — Telephone Encounter (Signed)
Called and scheduled pt

## 2021-07-17 ENCOUNTER — Other Ambulatory Visit: Payer: Self-pay | Admitting: Family Medicine

## 2021-07-17 ENCOUNTER — Telehealth: Payer: Self-pay

## 2021-07-17 NOTE — Progress Notes (Signed)
    Chronic Care Management Pharmacy Assistant   Name: Tony Manning  MRN: [id number] DOB: 05-27-1961   Reason for Encounter: CCM ([employer]) 2023 Patient Assistance)   Patient assistance for [employer]) for 2023 has been completed and uploaded. Called patient to inform him and verify if he wanted forms mailed, e-mailed or left at [facility] location; No answer; left message.  Time Spent: 20 Minutes  Debbora Dus, CPP notified  Marijean Niemann, [place] Assistant [phone]

## 2021-07-17 NOTE — Progress Notes (Signed)
07/17/2021 - Patient Ozempic renewal through [employer] was completed and uploaded on 07/17/2021. Called patient to verify how he wanted to obtain the forms (mail, e-mail or [place] location). No answer; left message.  Debbora Dus, CPP notified  Marijean Niemann, [place] Clinical Pharmacy Assistant [phone]

## 2021-07-18 NOTE — Progress Notes (Signed)
Called patient; no answer; left message  Debbora Dus, CPP notified  Marijean Niemann, [place] Clinical Pharmacy Assistant [phone]  Time Spent:  3 minutes

## 2021-07-21 ENCOUNTER — Other Ambulatory Visit: Payer: Self-pay | Admitting: Family Medicine

## 2021-07-21 ENCOUNTER — Other Ambulatory Visit: Payer: Self-pay | Admitting: Student

## 2021-07-22 ENCOUNTER — Telehealth: Payer: Self-pay

## 2021-07-22 NOTE — Progress Notes (Addendum)
Chronic Care Management Pharmacy Assistant   Name: Tony Manning  MRN: [id number] DOB: Jan 29, 1961  Reason for Encounter: CCM (Medication Adherence and Delivery Coordination)   Recent office visits:  None since last CCM contact  Recent consult visits:  None since last CCM contact  Hospital visits:  None in previous 6 months  Medications: Outpatient Encounter Medications as of 07/22/2021  Medication Sig   amitriptyline (ELAVIL) 10 MG tablet Take 1 tablet (10 mg total) by mouth at bedtime as needed for sleep.   amLODipine (NORVASC) 10 MG tablet TAKE ONE TABLET BY MOUTH EVERY MORNING   aspirin 81 MG chewable tablet Chew 81 mg by mouth at bedtime.   atorvastatin (LIPITOR) 40 MG tablet TAKE ONE TABLET BY MOUTH EVERY MORNING   Blood Glucose Monitoring Suppl (ONETOUCH VERIO) w/Device KIT Use to check blood sugars TID. Dx: E11.9   carvedilol (COREG) 6.25 MG tablet TAKE ONE TABLET BY MOUTH TWICE DAILY   cyanocobalamin (,VITAMIN B-12,) 1000 MCG/ML injection Inject 1 mL (1,000 mcg total) into the muscle every 14 (fourteen) days.   cyclobenzaprine (FLEXERIL) 5 MG tablet Take 5 mg by mouth 3 (three) times daily as needed for muscle spasms.   diclofenac Sodium (VOLTAREN) 1 % GEL Apply 2-4 g topically 4 (four) times daily. (Patient not taking: Reported on 07/15/2021)   famotidine (PEPCID) 20 MG tablet Take 1 tablet (20 mg total) by mouth at bedtime.   glimepiride (AMARYL) 4 MG tablet TAKE ONE TABLET BY MOUTH TWICE DAILY   glucose blood (ONETOUCH VERIO) test strip Use to check blood sugars TID. Dx: E11.9   Insulin NPH, Human,, Isophane, (NOVOLIN N FLEXPEN) 100 UNIT/ML Kiwkpen Inject 16 Units into the skin in the morning and at bedtime.   Insulin Pen Needle 32G X 4 MM MISC Use daily with insulin pen.   Insulin Syringe-Needle U-100 (INSULIN SYRINGE 1CC/31GX5/16") 31G X 5/16" 1 ML MISC Use with NPH insulin twice daily.   isosorbide mononitrate (IMDUR) 30 MG 24 hr tablet TAKE ONE TABLET BY  MOUTH EVERYDAY AT BEDTIME   Lancets (ONETOUCH ULTRASOFT) lancets Use to check blood sugars TID. Dx: E11.9   losartan (COZAAR) 100 MG tablet TAKE ONE TABLET BY MOUTH EVERYDAY AT BEDTIME   metFORMIN (GLUCOPHAGE) 1000 MG tablet Take 0.5 tablets (500 mg total) by mouth 2 (two) times daily with a meal.   nitroGLYCERIN (NITROSTAT) 0.4 MG SL tablet Place 1 tablet (0.4 mg total) under the tongue every 5 (five) minutes as needed for chest pain.   ondansetron (ZOFRAN) 4 MG tablet Take 4 mg by mouth every 8 (eight) hours as needed.   oxyCODONE (OXY IR/ROXICODONE) 5 MG immediate release tablet Take 5 mg by mouth every 6 (six) hours as needed.   pantoprazole (PROTONIX) 40 MG tablet TAKE ONE TABLET BY MOUTH EVERY MORNING   Semaglutide, 1 MG/DOSE, 4 MG/3ML SOPN Inject 1 mg as directed once a week.   tamsulosin (FLOMAX) 0.4 MG CAPS capsule TAKE ONE TABLET BY MOUTH EVERY MORNING and TAKE ONE TABLET BY MOUTH EVERYDAY AT BEDTIME   No facility-administered encounter medications on file as of 07/22/2021.   BP Readings from Last 3 Encounters:  06/09/21 122/74  05/05/21 126/72  01/27/21 124/80    Lab Results  Component Value Date   HGBA1C 7.2 (H) 06/09/2021    No OVs, Consults, or hospital visits since last care coordination call / Pharmacist visit. No medication changes indicated  Last adherence delivery date: 07/03/2021  Patient is due for next adherence  delivery on: 08/04/2021  Multiple attempts made to reach patient. Unsuccessful outreach. Will refill based off of last adherence fill.    This delivery to include: Adherence Packaging  30 Days  Packs: Glimepiride 4mg - 1 tablet twice daily (1 breakfast, 1 evening meal) Pantoprazole 40 mg- 1 tablet daily (1 breakfast) Tamsulosin 0.4mg  2 capsule daily (1 breakfast, 1 bedtime)   Losartan 100mg - 1 tablet daily- (1 bedtime) Famotidine 20mg - 1 tablet daily (1 bedtime) Isosorbide 30mg - 1 tablet daily (1 bedtime) Carvedilol 6.25mg - 1 tablet twice daily (1  breakfast, 1 evening meal) Aspirin 81mg - 1 tablet daily (breakfast) Metformin 500mg  1 tablet twice daily (1 tablet  breakfast, 1 tablet evening meal) Amlodipine 10mg - 1 tablet daily (1 breakfast) Atorvastatin 40mg - 1 tablet daily (1 breakfast)   VIAL medications: None   Unable to verify if patient needed the following:  Ozempic  1 mg weekly - gets through patient assistance Novolin N Flexpen 100 unit/mL (3 mL)- Inject 16 units twice daily (uses 3 days per week on average) Onetouch Verio test strips - PRN Oxycodone-acetaminophen 5mg -325 - Take 1 tablet by mouth four times daily (no refills)  Refills requested from providers include: Pantoprazole 40 mg- 1 tablet daily (1 breakfast) Amlodipine 10mg - 1 tablet daily (1 breakfast) Atorvastatin 40mg - 1 tablet daily (1 breakfast)  Delivery scheduled for 08/04/2021. Unable to speak with patient to confirm date.   Annual wellness visit in last year? Yes 09/20/2020 Most Recent BP reading: 122/74 on 06/09/2021  If Diabetic: Most recent A1C reading: 7.2 on 06/09/2021 Last eye exam / retinopathy screening: Up to date Last diabetic foot exam: Up to date  Debbora Dus, CPP notified  Marijean Niemann, [place] Assistant [phone]  I have reviewed the care management and care coordination activities outlined in this encounter and I am certifying that I agree with the content of this note. No further action required.  Debbora Dus, PharmD Clinical Pharmacist [facility] [phone]

## 2021-07-22 NOTE — Progress Notes (Incomplete Revision)
Chronic Care Management Pharmacy Assistant   Name: Tony Manning  MRN: [id number] DOB: 1961-08-14  Reason for Encounter: CCM (Medication Adherence and Delivery Coordination)   Recent office visits:  None since last CCM contact  Recent consult visits:  None since last CCM contact  Hospital visits:  None in previous 6 months  Medications: Outpatient Encounter Medications as of 07/22/2021  Medication Sig   amitriptyline (ELAVIL) 10 MG tablet Take 1 tablet (10 mg total) by mouth at bedtime as needed for sleep.   amLODipine (NORVASC) 10 MG tablet TAKE ONE TABLET BY MOUTH EVERY MORNING   aspirin 81 MG chewable tablet Chew 81 mg by mouth at bedtime.   atorvastatin (LIPITOR) 40 MG tablet TAKE ONE TABLET BY MOUTH EVERY MORNING   Blood Glucose Monitoring Suppl (ONETOUCH VERIO) w/Device KIT Use to check blood sugars TID. Dx: E11.9   carvedilol (COREG) 6.25 MG tablet TAKE ONE TABLET BY MOUTH TWICE DAILY   cyanocobalamin (,VITAMIN B-12,) 1000 MCG/ML injection Inject 1 mL (1,000 mcg total) into the muscle every 14 (fourteen) days.   cyclobenzaprine (FLEXERIL) 5 MG tablet Take 5 mg by mouth 3 (three) times daily as needed for muscle spasms.   diclofenac Sodium (VOLTAREN) 1 % GEL Apply 2-4 g topically 4 (four) times daily. (Patient not taking: Reported on 07/15/2021)   famotidine (PEPCID) 20 MG tablet Take 1 tablet (20 mg total) by mouth at bedtime.   glimepiride (AMARYL) 4 MG tablet TAKE ONE TABLET BY MOUTH TWICE DAILY   glucose blood (ONETOUCH VERIO) test strip Use to check blood sugars TID. Dx: E11.9   Insulin NPH, Human,, Isophane, (NOVOLIN N FLEXPEN) 100 UNIT/ML Kiwkpen Inject 16 Units into the skin in the morning and at bedtime.   Insulin Pen Needle 32G X 4 MM MISC Use daily with insulin pen.   Insulin Syringe-Needle U-100 (INSULIN SYRINGE 1CC/31GX5/16") 31G X 5/16" 1 ML MISC Use with NPH insulin twice daily.   isosorbide mononitrate (IMDUR) 30 MG 24 hr tablet TAKE ONE TABLET BY  MOUTH EVERYDAY AT BEDTIME   Lancets (ONETOUCH ULTRASOFT) lancets Use to check blood sugars TID. Dx: E11.9   losartan (COZAAR) 100 MG tablet TAKE ONE TABLET BY MOUTH EVERYDAY AT BEDTIME   metFORMIN (GLUCOPHAGE) 1000 MG tablet Take 0.5 tablets (500 mg total) by mouth 2 (two) times daily with a meal.   nitroGLYCERIN (NITROSTAT) 0.4 MG SL tablet Place 1 tablet (0.4 mg total) under the tongue every 5 (five) minutes as needed for chest pain.   ondansetron (ZOFRAN) 4 MG tablet Take 4 mg by mouth every 8 (eight) hours as needed.   oxyCODONE (OXY IR/ROXICODONE) 5 MG immediate release tablet Take 5 mg by mouth every 6 (six) hours as needed.   pantoprazole (PROTONIX) 40 MG tablet TAKE ONE TABLET BY MOUTH EVERY MORNING   Semaglutide, 1 MG/DOSE, 4 MG/3ML SOPN Inject 1 mg as directed once a week.   tamsulosin (FLOMAX) 0.4 MG CAPS capsule TAKE ONE TABLET BY MOUTH EVERY MORNING and TAKE ONE TABLET BY MOUTH EVERYDAY AT BEDTIME   No facility-administered encounter medications on file as of 07/22/2021.   BP Readings from Last 3 Encounters:  06/09/21 122/74  05/05/21 126/72  01/27/21 124/80    Lab Results  Component Value Date   HGBA1C 7.2 (H) 06/09/2021    No OVs, Consults, or hospital visits since last care coordination call / Pharmacist visit. No medication changes indicated  Last adherence delivery date: 07/03/2021  Patient is due for next adherence  delivery on: 08/04/2021  Multiple attempts made to reach patient. Unsuccessful outreach. Will refill based off of last adherence fill.    This delivery to include: Adherence Packaging  30 Days  Packs: Glimepiride 4mg - 1 tablet twice daily (1 breakfast, 1 evening meal) Pantoprazole 40 mg- 1 tablet daily (1 breakfast) Tamsulosin 0.4mg  2 capsule daily (1 breakfast, 1 bedtime)   Losartan 100mg - 1 tablet daily- (1 bedtime) Famotidine 20mg - 1 tablet daily (1 bedtime) Isosorbide 30mg - 1 tablet daily (1 bedtime) Carvedilol 6.25mg - 1 tablet twice daily (1  breakfast, 1 evening meal) Aspirin 81mg - 1 tablet daily (breakfast) Metformin 500mg  1 tablet twice daily (1 tablet  breakfast, 1 tablet evening meal) Amlodipine 10mg - 1 tablet daily (1 breakfast) Atorvastatin 40mg - 1 tablet daily (1 breakfast)   VIAL medications: None   Patient declined the following medications this month: Ozempic 0.5 mg weekly - gets through patient assistance  Unable to verify if patient needed the following:  Novolin N Flexpen 100 unit/mL (3 mL)- Inject 16 units twice daily (uses 3 days per week on average) Onetouch Verio test strips - PRN Oxycodone-acetaminophen 5mg -325 - Take 1 tablet by mouth four times daily (no refills)  Refills requested from providers include: Pantoprazole 40 mg- 1 tablet daily (1 breakfast) Amlodipine 10mg - 1 tablet daily (1 breakfast) Atorvastatin 40mg - 1 tablet daily (1 breakfast)  Delivery scheduled for 08/04/2021. Unable to speak with patient to confirm date.   Annual wellness visit in last year? Yes 09/20/2020 Most Recent BP reading: 122/74 on 06/09/2021  If Diabetic: Most recent A1C reading: 7.2 on 06/09/2021 Last eye exam / retinopathy screening: Up to date Last diabetic foot exam: Up to date  Debbora Dus, CPP notified  Marijean Niemann, [place] Assistant [phone]  Need to send Ozempic dose increase to PAP.

## 2021-07-23 ENCOUNTER — Ambulatory Visit: Payer: Medicare HMO

## 2021-08-06 DIAGNOSIS — E7800 Pure hypercholesterolemia, unspecified: Secondary | ICD-10-CM | POA: Diagnosis not present

## 2021-08-06 DIAGNOSIS — I10 Essential (primary) hypertension: Secondary | ICD-10-CM

## 2021-08-06 DIAGNOSIS — Z7984 Long term (current) use of oral hypoglycemic drugs: Secondary | ICD-10-CM | POA: Diagnosis not present

## 2021-08-06 DIAGNOSIS — E1149 Type 2 diabetes mellitus with other diabetic neurological complication: Secondary | ICD-10-CM | POA: Diagnosis not present

## 2021-08-06 DIAGNOSIS — Z794 Long term (current) use of insulin: Secondary | ICD-10-CM | POA: Diagnosis not present

## 2021-08-06 NOTE — Progress Notes (Addendum)
Called patient to follow up on PAP forms. No answer; left voicemail.  Debbora Dus, CPP notified  Marijean Niemann, [place] Clinical Pharmacy Assistant [phone]  Time Spent:  5 Minutes

## 2021-08-06 NOTE — Telephone Encounter (Signed)
Application reviewed for Ozempic 1 mg renewal. Will go ahead and have [place] print and mail to patient.

## 2021-08-07 ENCOUNTER — Ambulatory Visit: Payer: Medicare HMO

## 2021-08-08 ENCOUNTER — Telehealth: Payer: Self-pay

## 2021-08-08 NOTE — Progress Notes (Signed)
Patient assistance forms for Ozempic printed and mailed to patient address with instructions .  Debbora Dus, CPP notified  Avel Sensor, [place] Assistant [phone]  Total time spent for month CPA: 10 min.

## 2021-08-11 DIAGNOSIS — M79652 Pain in left thigh: Secondary | ICD-10-CM | POA: Diagnosis not present

## 2021-08-11 DIAGNOSIS — G894 Chronic pain syndrome: Secondary | ICD-10-CM | POA: Diagnosis not present

## 2021-08-11 DIAGNOSIS — M545 Low back pain, unspecified: Secondary | ICD-10-CM | POA: Diagnosis not present

## 2021-08-11 DIAGNOSIS — M79604 Pain in right leg: Secondary | ICD-10-CM | POA: Diagnosis not present

## 2021-08-15 ENCOUNTER — Telehealth: Payer: Self-pay

## 2021-08-17 ENCOUNTER — Other Ambulatory Visit: Payer: Self-pay | Admitting: Cardiology

## 2021-08-20 ENCOUNTER — Telehealth: Payer: Medicare HMO

## 2021-08-25 ENCOUNTER — Other Ambulatory Visit: Payer: Self-pay | Admitting: Family Medicine

## 2021-08-25 ENCOUNTER — Other Ambulatory Visit: Payer: Self-pay | Admitting: Adult Health

## 2021-09-05 ENCOUNTER — Telehealth: Payer: Self-pay | Admitting: Family Medicine

## 2021-09-05 ENCOUNTER — Encounter: Payer: Self-pay | Admitting: Family Medicine

## 2021-09-05 ENCOUNTER — Telehealth (INDEPENDENT_AMBULATORY_CARE_PROVIDER_SITE_OTHER): Payer: Medicare HMO | Admitting: Family Medicine

## 2021-09-05 DIAGNOSIS — U071 COVID-19: Secondary | ICD-10-CM | POA: Diagnosis not present

## 2021-09-05 MED ORDER — MOLNUPIRAVIR EUA 200MG CAPSULE: 4.0000 | ORAL_CAPSULE | Freq: Two times a day (BID) | ORAL | 0 refills | Status: AC

## 2021-09-05 NOTE — Telephone Encounter (Signed)
Pt called stating that he tested positive for covid. Pt is asking since there are no available appt could he get something called in. Please advise.

## 2021-09-05 NOTE — Telephone Encounter (Signed)
Called patient per Tony Manning will add today at 4pm for virtual. Will get vitals if able to give to [facility] when she calls.

## 2021-09-05 NOTE — Progress Notes (Signed)
Virtual visit completed through WebEx or similar program Patient location: home  Provider location: [place] at [facility], office  Participants: Patient and me (unless stated otherwise below)  Pandemic considerations d/w pt.   Limitations and rationale for visit method d/w patient.  Patient agreed to proceed.   CC:  HPI: Patient started showing sx Sunday and took covid test last night and was positive. He had prev neg test earlier in the week. Patient has had fever, head congestion, cough, fatigue, loss of taste and smell. Patient has been taking tylenol and mucinex.  Can still take a deep breath.  Not SOB.  Aches are better in the meantime but he has more head congestion recently.    Sugar has been ~180 recently.  No low sugars.    Meds and allergies reviewed.   ROS: Per HPI unless specifically indicated in ROS section   NAD Speech wnl  A/P: COVID-positive.  Discussed options, specifically antiviral treatment.  Discussed emergency use authorization.  Reasonable to start [place].   Supportive care otherwise.  Rest and fluids.  Routine cautions given to patient.  Discussed routine masking and quarantine intervals.  He can update me as needed.  He agrees with plan.  Okay for outpatient follow-up.

## 2021-09-08 DIAGNOSIS — U071 COVID-19: Secondary | ICD-10-CM | POA: Insufficient documentation

## 2021-09-08 NOTE — Assessment & Plan Note (Signed)
COVID-positive.  Discussed options, specifically antiviral treatment.  Discussed emergency use authorization.  Reasonable to start [place].   Supportive care otherwise.  Rest and fluids.  Routine cautions given to patient.  Discussed routine masking and quarantine intervals.  He can update me as needed.  He agrees with plan.  Okay for outpatient follow-up.

## 2021-09-15 DIAGNOSIS — M5030 Other cervical disc degeneration, unspecified cervical region: Secondary | ICD-10-CM | POA: Diagnosis not present

## 2021-09-15 DIAGNOSIS — G894 Chronic pain syndrome: Secondary | ICD-10-CM | POA: Diagnosis not present

## 2021-09-15 DIAGNOSIS — M25511 Pain in right shoulder: Secondary | ICD-10-CM | POA: Diagnosis not present

## 2021-09-15 DIAGNOSIS — M4727 Other spondylosis with radiculopathy, lumbosacral region: Secondary | ICD-10-CM | POA: Diagnosis not present

## 2021-09-15 DIAGNOSIS — M4722 Other spondylosis with radiculopathy, cervical region: Secondary | ICD-10-CM | POA: Diagnosis not present

## 2021-09-19 ENCOUNTER — Other Ambulatory Visit: Payer: Self-pay | Admitting: Family Medicine

## 2021-09-22 ENCOUNTER — Ambulatory Visit: Payer: Self-pay

## 2021-09-22 ENCOUNTER — Telehealth: Payer: Self-pay

## 2021-09-22 NOTE — Progress Notes (Addendum)
Chronic Care Management Pharmacy Assistant   Name: Tony Manning  MRN: [id number] DOB: [birth year]  Reason for Encounter: CCM (Medication Adherence and Delivery Coordination)   Recent office visits:  09/05/2021 - Crawford Givens, MD - Patient presented for Covid positive. Start: molnupiravir EUA (LAGEVRIO) 200 mg CAPS capsule. Stop due to patient not taking: oxyCODONE (OXY IR/ROXICODONE) 5 MG immediate release tablet.  Recent consult visits:  None since last CCM contact  Hospital visits:  None in previous 6 months  Medications: Outpatient Encounter Medications as of 09/22/2021  Medication Sig   amitriptyline (ELAVIL) 10 MG tablet Take 1 tablet (10 mg total) by mouth at bedtime as needed for sleep.   amLODipine (NORVASC) 10 MG tablet TAKE ONE TABLET BY MOUTH EVERY MORNING   aspirin 81 MG chewable tablet Chew 81 mg by mouth at bedtime.   atorvastatin (LIPITOR) 40 MG tablet TAKE ONE TABLET BY MOUTH EVERY MORNING   Blood Glucose Monitoring Suppl (ONETOUCH VERIO) w/Device KIT Use to check blood sugars TID. Dx: E11.9   carvedilol (COREG) 6.25 MG tablet TAKE ONE TABLET BY MOUTH TWICE DAILY   cyanocobalamin (,VITAMIN B-12,) 1000 MCG/ML injection Inject 1 mL (1,000 mcg total) into the muscle every 14 (fourteen) days.   cyclobenzaprine (FLEXERIL) 5 MG tablet Take 5 mg by mouth 3 (three) times daily as needed for muscle spasms.   diclofenac Sodium (VOLTAREN) 1 % GEL Apply 2-4 g topically 4 (four) times daily.   famotidine (PEPCID) 20 MG tablet TAKE ONE TABLET BY MOUTH EVERYDAY AT BEDTIME   glimepiride (AMARYL) 4 MG tablet TAKE ONE TABLET BY MOUTH TWICE DAILY   glucose blood (ONETOUCH VERIO) test strip Use to check blood sugars TID. Dx: E11.9   Insulin NPH, Human,, Isophane, (NOVOLIN N FLEXPEN) 100 UNIT/ML Kiwkpen Inject 16 Units into the skin in the morning and at bedtime.   Insulin Pen Needle 32G X 4 MM MISC Use daily with insulin pen.   Insulin Syringe-Needle U-100 (INSULIN SYRINGE  1CC/31GX5/16") 31G X 5/16" 1 ML MISC Use with NPH insulin twice daily.   isosorbide mononitrate (IMDUR) 30 MG 24 hr tablet TAKE ONE TABLET BY MOUTH EVERYDAY AT BEDTIME   Lancets (ONETOUCH ULTRASOFT) lancets Use to check blood sugars TID. Dx: E11.9   losartan (COZAAR) 100 MG tablet TAKE ONE TABLET BY MOUTH EVERYDAY AT BEDTIME   metFORMIN (GLUCOPHAGE) 1000 MG tablet Take 0.5 tablets (500 mg total) by mouth 2 (two) times daily with a meal.   nitroGLYCERIN (NITROSTAT) 0.4 MG SL tablet Place 1 tablet (0.4 mg total) under the tongue every 5 (five) minutes as needed for chest pain.   ondansetron (ZOFRAN) 4 MG tablet Take 4 mg by mouth every 8 (eight) hours as needed.   oxyCODONE-acetaminophen (PERCOCET/ROXICET) 5-325 MG tablet Take 1 tablet by mouth 4 (four) times daily.   pantoprazole (PROTONIX) 40 MG tablet TAKE ONE TABLET BY MOUTH EVERY MORNING   Semaglutide, 1 MG/DOSE, 4 MG/3ML SOPN Inject 1 mg as directed once a week.   tamsulosin (FLOMAX) 0.4 MG CAPS capsule TAKE ONE TABLET BY MOUTH EVERY MORNING and TAKE ONE TABLET BY MOUTH EVERYDAY AT BEDTIME   No facility-administered encounter medications on file as of 09/22/2021.   BP Readings from Last 3 Encounters:  09/05/21 130/77  06/09/21 122/74  05/05/21 126/72    Lab Results  Component Value Date   HGBA1C 7.2 (H) 06/09/2021    Recent OV, Consult or Hospital visit:  No medication changes indicated  Last adherence delivery date:  08/04/2021      Patient is due for next adherence delivery on: 10/02/2021  Multiple attempts made to reach patient. Unsuccessful outreach. Will refill based off of last adherence fill.    This delivery to include: Adherence Packaging  30 Days  Packs: Atorvastatin 40mg - 1 tablet daily (1 breakfast) Amlodipine 10mg - 1 tablet daily (1 breakfast) Pantoprazole 40 mg- 1 tablet daily (1 breakfast) Glimepiride 4mg - 1 tablet twice daily (1 breakfast, 1 evening meal) Tamsulosin 0.4mg  2 capsule daily (1 breakfast, 1  bedtime)   Losartan 100mg - 1 tablet daily- (1 bedtime) Famotidine 20mg - 1 tablet daily (1 bedtime) Isosorbide 30mg - 1 tablet daily (1 bedtime) Carvedilol 6.25mg - 1 tablet twice daily (1 breakfast, 1 evening meal) Aspirin 81mg - 1 tablet daily (breakfast) Metformin 500mg  1 tablet twice daily (1 tablet  breakfast, 1 tablet evening meal)  VIAL medications: Novolin N Flexpen 100 unit/mL (3 mL)- Inject 16 units twice daily (uses 3 days per week on average) Onetouch Verio test strips - PRN  Refills requested from providers include: Tamsulosin 0.4mg  2 capsule daily (1 breakfast, 1 bedtime)    Delivery scheduled for 10/02/2021. Unable to speak with patient to confirm date.   Annual wellness visit in last year? No Most Recent BP reading: 130/77 on 09/05/2021  If Diabetic: Most recent A1C reading: 7.2 on 06/09/2021 Last eye exam / retinopathy screening: Up to date Last diabetic foot exam: Up to date  Debbora Dus, CPP notified  Marijean Niemann, [place] Assistant [phone]  I have reviewed the care management and care coordination activities outlined in this encounter and I am certifying that I agree with the content of this note. No further action required.  Debbora Dus, PharmD Clinical Pharmacist [facility] at [facility] [phone]

## 2021-09-25 ENCOUNTER — Ambulatory Visit: Payer: Medicare HMO | Admitting: Family Medicine

## 2021-09-29 ENCOUNTER — Ambulatory Visit: Payer: Medicare HMO | Admitting: Podiatry

## 2021-10-01 ENCOUNTER — Telehealth: Payer: Self-pay

## 2021-10-01 NOTE — Progress Notes (Signed)
° ° °  Chronic Care Management Pharmacy Assistant   Name: Tony Manning  MRN: [id number] DOB: 1960-09-17  Reason for Encounter: CCM (Ozempic, [place] 2023 Update)   Called patient in regards to Ozempic, [place] and Novolin R patient assistance forms for 2023. No answer; left message.  Debbora Dus, CPP notified  Marijean Niemann, [place] Clinical Pharmacy Assistant [phone]  Time Spent: 5 Minutes

## 2021-10-02 DIAGNOSIS — G894 Chronic pain syndrome: Secondary | ICD-10-CM | POA: Diagnosis not present

## 2021-10-02 DIAGNOSIS — Z01818 Encounter for other preprocedural examination: Secondary | ICD-10-CM | POA: Diagnosis not present

## 2021-10-07 NOTE — Telephone Encounter (Signed)
Error

## 2021-10-15 DIAGNOSIS — G4733 Obstructive sleep apnea (adult) (pediatric): Secondary | ICD-10-CM | POA: Diagnosis not present

## 2021-10-16 ENCOUNTER — Telehealth: Payer: Self-pay

## 2021-10-16 ENCOUNTER — Other Ambulatory Visit: Payer: Self-pay | Admitting: Family Medicine

## 2021-10-16 NOTE — Progress Notes (Signed)
Chronic Care Management Pharmacy Assistant   Name: Tony Manning  MRN: [id number] DOB: 10-23-1960  Reason for Encounter: CCM (Appointment Reminder)   Medications: Outpatient Encounter Medications as of 10/16/2021  Medication Sig   amitriptyline (ELAVIL) 10 MG tablet Take 1 tablet (10 mg total) by mouth at bedtime as needed for sleep.   amLODipine (NORVASC) 10 MG tablet TAKE ONE TABLET BY MOUTH EVERY MORNING   aspirin 81 MG chewable tablet Chew 81 mg by mouth at bedtime.   atorvastatin (LIPITOR) 40 MG tablet TAKE ONE TABLET BY MOUTH EVERY MORNING   Blood Glucose Monitoring Suppl (ONETOUCH VERIO) w/Device KIT Use to check blood sugars TID. Dx: E11.9   carvedilol (COREG) 6.25 MG tablet TAKE ONE TABLET BY MOUTH TWICE DAILY   cyanocobalamin (,VITAMIN B-12,) 1000 MCG/ML injection Inject 1 mL (1,000 mcg total) into the muscle every 14 (fourteen) days.   cyclobenzaprine (FLEXERIL) 5 MG tablet Take 5 mg by mouth 3 (three) times daily as needed for muscle spasms.   diclofenac Sodium (VOLTAREN) 1 % GEL Apply 2-4 g topically 4 (four) times daily.   famotidine (PEPCID) 20 MG tablet TAKE ONE TABLET BY MOUTH EVERYDAY AT BEDTIME   glimepiride (AMARYL) 4 MG tablet TAKE ONE TABLET BY MOUTH TWICE DAILY   glucose blood (ONETOUCH VERIO) test strip Use to check blood sugars TID. Dx: E11.9   Insulin NPH, Human,, Isophane, (NOVOLIN N FLEXPEN) 100 UNIT/ML Kiwkpen Inject 16 Units into the skin in the morning and at bedtime.   Insulin Pen Needle 32G X 4 MM MISC Use daily with insulin pen.   Insulin Syringe-Needle U-100 (INSULIN SYRINGE 1CC/31GX5/16") 31G X 5/16" 1 ML MISC Use with NPH insulin twice daily.   isosorbide mononitrate (IMDUR) 30 MG 24 hr tablet TAKE ONE TABLET BY MOUTH EVERYDAY AT BEDTIME   Lancets (ONETOUCH ULTRASOFT) lancets Use to check blood sugars TID. Dx: E11.9   losartan (COZAAR) 100 MG tablet TAKE ONE TABLET BY MOUTH EVERYDAY AT BEDTIME   metFORMIN (GLUCOPHAGE) 1000 MG tablet Take 0.5  tablets (500 mg total) by mouth 2 (two) times daily with a meal.   nitroGLYCERIN (NITROSTAT) 0.4 MG SL tablet Place 1 tablet (0.4 mg total) under the tongue every 5 (five) minutes as needed for chest pain.   ondansetron (ZOFRAN) 4 MG tablet Take 4 mg by mouth every 8 (eight) hours as needed.   oxyCODONE-acetaminophen (PERCOCET/ROXICET) 5-325 MG tablet Take 1 tablet by mouth 4 (four) times daily.   pantoprazole (PROTONIX) 40 MG tablet TAKE ONE TABLET BY MOUTH EVERY MORNING   Semaglutide, 1 MG/DOSE, 4 MG/3ML SOPN Inject 1 mg as directed once a week.   tamsulosin (FLOMAX) 0.4 MG CAPS capsule TAKE ONE TABLET BY MOUTH EVERY MORNING and TAKE ONE TABLET BY MOUTH EVERYDAY AT BEDTIME   No facility-administered encounter medications on file as of 10/16/2021.   BP Readings from Last 3 Encounters:  09/05/21 130/77  06/09/21 122/74  05/05/21 126/72    Lab Results  Component Value Date   HGBA1C 7.2 (H) 06/09/2021    Tony Manning was contacted to remind of upcoming telephone visit with Phil Dopp on 10/21/2021 at 10:30 am. Patient was reminded to have all medications, supplements and any blood glucose and blood pressure readings available for review at appointment. If unable to reach, a voicemail was left for patient.   Star Rating Drugs: Medication:  Last Fill: Day Supply Losartan 100 mg 09/26/2021 30  Atorvastatin 40 mg 09/26/2021 30  Glimepiride 4 mg  09/26/2021 30 Metformin 1000 mg 09/26/2021 30  Semaglutide 1 mg PAP   Debbora Dus, CPP notified  Marijean Niemann, [place] Clinical Pharmacy Assistant [phone]  Time Spent: 10 Minutes

## 2021-10-20 ENCOUNTER — Other Ambulatory Visit: Payer: Self-pay | Admitting: Family Medicine

## 2021-10-21 ENCOUNTER — Telehealth: Payer: Self-pay

## 2021-10-21 ENCOUNTER — Telehealth: Payer: Medicare HMO

## 2021-10-21 NOTE — Progress Notes (Unsigned)
Chronic Care Management Pharmacy Note   07/15/2021 Name:  Tony Manning   MRN :  [id number] DOB:  [birth year]  Summary: We discussed diabetes. His BG varies but has improved with Ozempic 0.5 mg. BG 109 and 119 yesterday and today fasting. He has not taken insulin the past 2 days. He takes his insulin sliding scale depending on his glucose level 4-5 days per week. Continues glimepiride and Metformin 1000 mg/day. With his CKD, would like to re-trial SGLT through PAP but pt reports his income may change and likely wont be eligible. For now we discussed trial of Ozempic 1 mg in order to wean further off insulin.   Recommendations: Increase Ozempic from 0.5 mg to 1 mg weekly (will send to [employer])  Plan: CCM follow up 1 month  Subjective: Tony Manning is an 61 y.o. year old male who is a primary patient of [name], Elveria Rising, MD.  The CCM team was consulted for assistance with disease management and care coordination needs.    Engaged with patient by telephone for follow up visit in response to provider referral for pharmacy case management and/or care coordination services.   Consent to Services:  The patient was given information about Chronic Care Management services, agreed to services, and gave verbal consent prior to initiation of services.  Please see initial visit note for detailed documentation.   Patient Care Team: Tonia Ghent, MD as PCP - General (Family Medicine) Adrian Prows, MD as Consulting Physician (Cardiology) Debbora Dus, [facility] as Pharmacist (Pharmacist)  Recent office visits: 09/05/2021 - [name], MD, Video Visit - Pt presented for COVID. Start molnupiravir 800 mg 2 times daily x 5 days. 06/09/2021 - [name], MD - Patient presented for Medicare Wellness. Labs: CMP, A1c, CBC, Lipid, PSA and Vit B. Foot exam completed. Ran out of Ozempic, will restart. A1c stable 7.2%, Kidney function declined, refer to renal clinic. Administered:  cyanocobalamin ((VITAMIN B-12)) injection 1,000 mcg. No medication changes. Follow up 3 months.    Recent consult visits:  10/02/2021 Vikki Ports, MD, Psychology - Pt presented for chronic pain syndrome. Discussed spinal cord stimulator procedure.   Hospital visits:  None in previous 6 months  Objective:  Lab Results  Component Value Date   CREATININE 1.73 (H) 06/09/2021   BUN 19 06/09/2021   GFR 42.15 (L) 06/09/2021   GFRNONAA 54 (L) 11/12/2020   GFRAA 42 (L) 05/05/2019   NA 142 06/09/2021   K 4.1 06/09/2021   CALCIUM 9.6 06/09/2021   CO2 24 06/09/2021   GLUCOSE 179 (H) 06/09/2021    Lab Results  Component Value Date/Time   HGBA1C 7.2 (H) 06/09/2021 09:23 AM   HGBA1C 7.5 (H) 01/27/2021 12:40 PM   GFR 42.15 (L) 06/09/2021 09:23 AM   GFR 44.41 (L) 01/27/2021 12:40 PM   MICROALBUR 1.5 08/15/2012 09:05 AM    Last diabetic Eye exam:  Lab Results  Component Value Date/Time   HMDIABEYEEXA Retinopathy (A) 01/13/2021 12:00 AM    Last diabetic Foot exam:  10/25/20 normal   Lab Results  Component Value Date   CHOL 112 06/09/2021   HDL 40.20 06/09/2021   LDLCALC 57 06/09/2021   TRIG 73.0 06/09/2021   CHOLHDL 3 06/09/2021    Hepatic Function Latest Ref Rng & Units 06/09/2021 01/27/2021 03/05/2020  Total Protein 6.0 - 8.3 g/dL 6.8 7.5 7.5  Albumin 3.5 - 5.2 g/dL 4.3 4.8 4.6  AST 0 - 37 U/L _0 ALT 0 -  53 U/L _0 Alk Phosphatase 39 - 117 U/L 90 78 67  Total Bilirubin 0.2 - 1.2 mg/dL 0.5 0.6 0.5  Bilirubin, Direct 0.0 - 0.3 mg/dL - 0.1 -    Lab Results  Component Value Date/Time   TSH 1.34 01/27/2021 12:40 PM   TSH 2.22 03/21/2018 12:05 PM    CBC Latest Ref Rng & Units 06/09/2021 01/27/2021 11/12/2020  WBC 4.0 - 10.5 K/uL 7.2 7.9 6.8  Hemoglobin 13.0 - 17.0 g/dL 13.7 14.0 13.3  Hematocrit 39.0 - 52.0 % 40.9 40.6 37.9(L)  Platelets 150.0 - 400.0 K/uL 198.0 223.0 215    Lab Results  Component Value Date/Time   VD25OH 19.37 (L) 03/21/2018 12:05 PM     Clinical ASCVD: Yes  The ASCVD Risk score (Arnett DK, et al., 2019) failed to calculate for the following reasons:   The patient has a prior MI or stroke diagnosis    Depression screen [facility] 2/9 09/20/2020 04/21/2018 10/11/2017  Decreased Interest 0 0 0  Down, Depressed, Hopeless 0 0 0  PHQ - 2 Score 0 0 0  Altered sleeping 0 - -  Tired, decreased energy 0 - -  Change in appetite 0 - -  Feeling bad or failure about yourself  0 - -  Trouble concentrating 0 - -  Moving slowly or fidgety/restless 0 - -  Suicidal thoughts 0 - -  PHQ-9 Score 0 - -  Difficult doing work/chores Not difficult at all - -  Some recent data might be hidden    Social History   Tobacco Use  Smoking Status Never  Smokeless Tobacco Former   Types: Chew   Quit date: 02/06/2016   BP Readings from Last 3 Encounters:  09/05/21 130/77  06/09/21 122/74  05/05/21 126/72   Pulse Readings from Last 3 Encounters:  06/09/21 65  05/05/21 65  01/27/21 62   Wt Readings from Last 3 Encounters:  09/05/21 191 lb (86.6 kg)  06/09/21 200 lb (90.7 kg)  05/05/21 202 lb (91.6 kg)   BMI Readings from Last 3 Encounters:  09/05/21 27.41 kg/m  06/09/21 28.70 kg/m  05/05/21 28.98 kg/m    Assessment/Interventions: Review of patient past medical history, allergies, medications, health status, including review of consultants reports, laboratory and other test data, was performed as part of comprehensive evaluation and provision of chronic care management services.   SDOH:  (Social Determinants of Health) assessments and interventions performed: Yes    CCM Care Plan  Allergies  Allergen Reactions   Ambien [Zolpidem] Other (See Comments)    Parasomnias, sleep walking   Lisinopril Cough   Tramadol Diarrhea    Medications Reviewed Today     Reviewed by Filbert Berthold, CMA (Certified Medical Assistant) on 09/05/21 at 1610  Med List Status: <None>   Medication Order Taking? Sig Documenting Provider Last Dose  Status Informant  amitriptyline (ELAVIL) 10 MG tablet [id number] Yes Take 1 tablet (10 mg total) by mouth at bedtime as needed for sleep. Tonia Ghent, MD Taking Active   amLODipine (NORVASC) 10 MG tablet [id number] Yes TAKE ONE TABLET BY MOUTH EVERY MORNING Adrian Prows, MD Taking Active   aspirin 81 MG chewable tablet [id number] Yes Chew 81 mg by mouth at bedtime. [provider] Taking Active Self  atorvastatin (LIPITOR) 40 MG tablet [id number] Yes TAKE ONE TABLET BY MOUTH EVERY MORNING Tonia Ghent, MD Taking Active   Blood Glucose Monitoring Suppl [facility] VERIO) w/Device KIT [id number] Yes Use to check  blood sugars TID. Dx: E11.9 Tonia Ghent, MD Taking Active   carvedilol (COREG) 6.25 MG tablet [id number] Yes TAKE ONE TABLET BY MOUTH TWICE DAILY Adrian Prows, MD Taking Active   cyanocobalamin (,VITAMIN B-12,) 1000 MCG/ML injection [id number] Yes Inject 1 mL (1,000 mcg total) into the muscle every 14 (fourteen) days. Tonia Ghent, MD Taking Active   cyclobenzaprine (FLEXERIL) 5 MG tablet [id number] Yes Take 5 mg by mouth 3 (three) times daily as needed for muscle spasms. [provider] Taking Active   diclofenac Sodium (VOLTAREN) 1 % GEL [id number] Yes Apply 2-4 g topically 4 (four) times daily. Tonia Ghent, MD Taking Active   famotidine (PEPCID) 20 MG tablet [id number] Yes TAKE ONE TABLET BY MOUTH EVERYDAY AT BEDTIME Parrett, Fonnie Mu, NP Taking Active   glimepiride (AMARYL) 4 MG tablet [id number] Yes TAKE ONE TABLET BY MOUTH TWICE DAILY Tonia Ghent, MD Taking Active   glucose blood [facility] VERIO) test strip [id number] Yes Use to check blood sugars TID. Dx: E11.9 Tonia Ghent, MD Taking Active   Insulin NPH, Human,, Isophane, (NOVOLIN N FLEXPEN) 100 UNIT/ML Mayer Masker [id number] Yes Inject 16 Units into the skin in the morning and at bedtime. Tonia Ghent, MD Taking Active   Insulin Pen Needle 32G X 4 MM MISC [id number] Yes Use daily with insulin pen. Tonia Ghent, MD Taking Active Self  Insulin Syringe-Needle U-100 (INSULIN SYRINGE 1CC/31GX5/16") 31G X 5/16" 1 ML MISC [id number] Yes Use with NPH insulin twice daily. Tonia Ghent, MD Taking Active   isosorbide mononitrate (IMDUR) 30 MG 24 hr tablet [id number] Yes TAKE ONE TABLET BY MOUTH EVERYDAY AT BEDTIME Adrian Prows, MD Taking Active   Lancets [facility] ULTRASOFT) lancets [id number] Yes Use to check blood sugars TID. Dx: E11.9 Tonia Ghent, MD Taking Active   losartan (COZAAR) 100 MG tablet [id number] Yes TAKE ONE TABLET BY MOUTH EVERYDAY AT BEDTIME Adrian Prows, MD Taking Active   metFORMIN (GLUCOPHAGE) 1000 MG tablet [id number] Yes Take 0.5 tablets (500 mg total) by mouth 2 (two) times daily with a meal. Tonia Ghent, MD Taking Active   nitroGLYCERIN (NITROSTAT) 0.4 MG SL tablet [id number] Yes Place 1 tablet (0.4 mg total) under the tongue every 5 (five) minutes as needed for chest pain. Miquel Dunn, NP Taking Active   ondansetron [facility]) 4 MG tablet [id number] Yes Take 4 mg by mouth every 8 (eight) hours as needed. [provider] Taking Active   oxyCODONE-acetaminophen (PERCOCET/ROXICET) 5-325 MG tablet [id number] Yes Take 1 tablet by mouth 4 (four) times daily. [provider] Taking Active   pantoprazole (PROTONIX) 40 MG tablet [id number] Yes TAKE ONE TABLET BY MOUTH EVERY MORNING Tonia Ghent, MD Taking Active   Semaglutide, 1 MG/DOSE, 4 MG/3ML SOPN [id number] Yes Inject 1 mg as directed once a week. Tonia Ghent, MD Taking Active   tamsulosin [facility]) 0.4 MG CAPS capsule [id number] Yes TAKE ONE TABLET BY MOUTH EVERY MORNING and TAKE ONE TABLET BY MOUTH EVERYDAY AT BEDTIME Tonia Ghent, MD Taking Active             Patient Active Problem List   Diagnosis Date Noted   COVID 09/08/2021   Advance care planning 06/13/2021   Insomnia 06/13/2021   B12 deficiency 01/30/2021   Fatigue 01/29/2021   Abdominal discomfort 01/05/2021   Dyspnea 11/12/2020    Sarcoidosis 11/12/2020   H/O colonoscopy 10/27/2020   Nocturia 05/03/2020   Right sided weakness 03/06/2020  Low back pain 01/29/2020   Facial weakness 11/16/2019   Hematuria 03/22/2018   Renal stones 03/22/2018   Vitamin D deficiency, unspecified 10/04/2017   Chronic neck pain (Primary Area of Pain) (B) (L>R) 09/21/2017   Chronic upper extremity pain (Secondary Area of Pain) (Bilateral) (L>R) 09/21/2017   Chronic left shoulder pain (Tertiary Area of Pain) 09/21/2017   Chronic low back pain (Fourth Area of Pain) (B) (R>L) 09/21/2017   Chronic pain of lower extremity (B) (R>L) 09/21/2017   Other long term (current) drug therapy 09/21/2017   Other specified health status 09/21/2017   Other chronic pain 08/22/2017   Other fatigue 05/10/2017   Radiculopathy 04/07/2017   Angina pectoris ([place]) 10/15/2016   Cervical radiculitis 07/23/2016   Dysphagia 06/12/2016   Sciatica 02/06/2016   Plantar fasciitis 01/16/2016   Right sided sciatica 05/16/2015   Hand pain 04/05/2015   Premature ejaculation 09/17/2014   Heel pain 09/17/2014   NSTEMI (non-ST elevated myocardial infarction) ([place]) 06/13/2014   Atypical chest pain 06/06/2014   Memory change 05/24/2014   Benign paroxysmal positional vertigo 07/13/2013   Orthostasis 07/13/2013   Migraine, unspecified, without mention of intractable migraine without mention of status migrainosus 04/03/2013   Medicare annual wellness visit, subsequent 02/17/2013   Pain in joint, ankle and foot 10/19/2012   Irritable mood 01/18/2012   Tobacco abuse 12/21/2011   Neck pain 12/03/2010   Hearing loss 12/03/2010   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   HYPERCHOLESTEROLEMIA 10/20/2010   DEPRESSION 10/20/2010   Osteoarthritis 10/20/2010   ARTHRITIS 10/20/2010   NEPHROLITHIASIS, HX OF 10/20/2010   ESSENTIAL HYPERTENSION, BENIGN 09/30/2010   CORONARY ATHEROSCLEROSIS NATIVE CORONARY ARTERY 09/30/2010   Sleep apnea 09/30/2010    CHEST PAIN UNSPECIFIED 09/30/2010   PERCUTANEOUS TRANSLUMINAL CORONARY ANGIOPLASTY, HX OF 09/30/2010    Immunization History  Administered Date(s) Administered   Influenza Inj Mdck Quad Pf 06/02/2018   Influenza Whole 09/17/2010   Influenza,inj,Quad PF,6+ Mos 06/08/2013, 05/22/2014, 05/12/2016, 05/08/2019, 05/27/2020   Influenza-Unspecified 06/08/2013, 05/22/2014, 06/08/2015, 05/12/2016, 05/21/2017, 06/02/2018, 05/09/2019, 05/21/2021   PFIZER(Purple Top)SARS-COV-2 Vaccination 11/27/2019, 12/25/2019, 08/07/2020, 05/21/2021   Pneumococcal Polysaccharide-23 09/08/2007, 02/16/2013   Td 09/07/2009    Conditions to be addressed/monitored:  Hypertension, Hyperlipidemia, and Diabetes  There are no care plans that you recently modified to display for this patient.   Current Barriers:  {pharmacybarriers:24917}  Pharmacist Clinical Goal(s):  Patient will {PHARMACYGOALCHOICES:24921} through collaboration with PharmD and provider.   Interventions: 1:1 collaboration with Tonia Ghent, MD regarding development and update of comprehensive plan of care as evidenced by provider attestation and co-signature Inter-disciplinary care team collaboration (see longitudinal plan of care) Comprehensive medication review performed; medication list updated in electronic medical record  Hyperlipidemia: (LDL goal < 70) -Controlled - LDL 59 -Current treatment: Atorvastatin 40 mg - 1 tablet daily (morning) -Medications previously tried: none  -Recommended to continue current medication  Diabetes (A1c goal <7%) -Uncontrolled - A1c 7.3% -Current medications: Glimepiride 4 mg - 1 tablet twice daily Insulin NPH Flexpen (Novolin N) - Inject 20 units into skin BID before meals  Metformin 1000 mg - 1 tablet BID with meals Ozempic 1 mg - Inject once weekly -Medications previously tried: none reported -Adherence: self-adjusts Novolin -Current home glucose readings - checking daily BID (Received steroid  shot yesterday) -Blood sugar readings are as follows:                Fasting AM         Bedtime 12/20/20 - 537  this morning 3 AM (Dizziness) - He took an addition Novolin 20 units. BG is slowly coming down.  11/19/20  90                  81 11/20/20  111                141 11/21/20  155                165 11/22/20  142                157 11/23/20  83                  - 11/24/20  125                - 11/25/20  202                58 (states he drank orange juice and ate a piece of chocolate to bring it up) 11/26/20  104  Fasting AM    Before evening meal 10/21/20:     197  145 10/22/20:     165             117 10/23/20:      88   186 10/24/20      195  291-(had already eaten before checking) 10/26/19:     181  119 10/27/19:     102  109 10/28/19:     135  166 10/28/20:     166   191 10/29/20:     182  We discussed: Pt approved for Ozempic - enrollments starts today and ends 08/06/21. Shipped on 12/19/20 - 10-14 business days, estimated arrival at office Jan 09, 2021. Shipped 2 boxes - Ozempic 0.25 mg (4 month supply). -Pt will come pick up in office once it arrives. -Recommended  Patient Goals/Self-Care Activities Patient will:  - check glucose twice daily, document, and provide at future appointments   Follow Up Plan: Telephone follow up appointment with care management team member scheduled for:   Medication Assistance: Application for Ozempic medication assistance program approved through 08/06/2021. PAP forms were mailed to patient 08/06/21 for renewal.   Adherence and Quality Review: Care Gaps: Colonoscopy  Star Rating Drugs: Medication:                Last Fill:         Day Supply Losartan 100 mg       09/26/2021      30         Atorvastatin 40 mg   09/26/2021      30         Glimepiride 4 mg       09/26/2021      30 Metformin 1000 mg   09/26/2021      30         Semaglutide 1 mg     PAP     Patient's preferred pharmacy is: [facility] [place], [place] - [address] [place] [address] Phone: [phone] Fax: [phone]  Packs, 30 DS: Aspirin 81 mg - 1 tablet daily (1 breakfast)  Amlodipine 10 mg- 1 tablet daily (1 breakfast) Atorvastatin 40 mg- 1 tablet daily (1 breakfast) Carvedilol 6.25 mg- 1 tablet twice daily (1 breakfast, 1 evening meal) Famotidine 20 mg- 1 tablet daily (1 bedtime) Glimepiride 4 mg- 1 tablet twice daily (1 breakfast, 1  evening meal) Isosorbide mononitrate 30 mg- 1 tablet daily (1 bedtime) Losartan 100 mg- 1 tablet daily- (1 bedtime) Metformin 1000 mg- 1/2 tablet twice daily (1 breakfast, 1 evening meal) Pantoprazole 40 mg- 1 tablet daily (1 breakfast) Tamsulosin 0.4 mg- 2 capsule daily (1 breakfast, 1 bedtime)    VIAL medications: Novolin N Flexpen 100 unit/mL (3 mL)- Inject 16 units twice daily (uses 3 days per week on average) Onetouch Verio test strips - PRN    Care Plan and Follow Up Patient Decision:  Patient agrees to Care Plan and Follow-up.  Debbora Dus, PharmD Clinical Pharmacist [facility] at [facility] [phone]

## 2021-10-21 NOTE — Telephone Encounter (Signed)
°  Chronic Care Management   Outreach Note  10/21/2021 Name: BULMARO FEAGANS MRN: [id number] DOB: [birth year]  Referred by: Tonia Ghent, MD Reason for referral : Chronic Care Management (Missed telephone visit)   Patient had a phone appointment scheduled with clinical pharmacist today.   An unsuccessful telephone outreach was attempted. The patient was referred to the pharmacist for assistance with medications, care management and care coordination.    Patient will NOT be penalized in any way for missing a CCM appointment. The no-show fee does not apply.   If possible, a message was left to return call to: [phone].   Debbora Dus, PharmD Clinical Pharmacist [facility] at [facility] [phone]

## 2021-10-21 NOTE — Progress Notes (Addendum)
Chronic Care Management Pharmacy Assistant   Name: Tony Manning  MRN: [id number] DOB: Jan 31, 1961  Reason for Encounter: CCM (Medication Adherence and Delivery Coordination)  Recent office visits:  None since last CCM contact  Recent consult visits:  None since last CCM contact  Hospital visits:  None in previous 6 months  Medications: Outpatient Encounter Medications as of 10/21/2021  Medication Sig   amitriptyline (ELAVIL) 10 MG tablet Take 1 tablet (10 mg total) by mouth at bedtime as needed for sleep.   amLODipine (NORVASC) 10 MG tablet TAKE ONE TABLET BY MOUTH EVERY MORNING   aspirin 81 MG chewable tablet Chew 81 mg by mouth at bedtime.   atorvastatin (LIPITOR) 40 MG tablet TAKE ONE TABLET BY MOUTH EVERY MORNING   Blood Glucose Monitoring Suppl (ONETOUCH VERIO) w/Device KIT Use to check blood sugars TID. Dx: E11.9   carvedilol (COREG) 6.25 MG tablet TAKE ONE TABLET BY MOUTH TWICE DAILY   cyanocobalamin (,VITAMIN B-12,) 1000 MCG/ML injection Inject 1 mL (1,000 mcg total) into the muscle every 14 (fourteen) days.   cyclobenzaprine (FLEXERIL) 5 MG tablet Take 5 mg by mouth 3 (three) times daily as needed for muscle spasms.   diclofenac Sodium (VOLTAREN) 1 % GEL Apply 2-4 g topically 4 (four) times daily.   famotidine (PEPCID) 20 MG tablet TAKE ONE TABLET BY MOUTH EVERYDAY AT BEDTIME   glimepiride (AMARYL) 4 MG tablet TAKE ONE TABLET BY MOUTH TWICE DAILY   glucose blood (ONETOUCH VERIO) test strip USE TO check blood glucose THREE TIMES DAILY AS DIRECTED   Insulin NPH, Human,, Isophane, (NOVOLIN N FLEXPEN) 100 UNIT/ML Kiwkpen Inject 16 Units into the skin in the morning and at bedtime.   Insulin Pen Needle 32G X 4 MM MISC Use daily with insulin pen.   Insulin Syringe-Needle U-100 (INSULIN SYRINGE 1CC/31GX5/16") 31G X 5/16" 1 ML MISC Use with NPH insulin twice daily.   isosorbide mononitrate (IMDUR) 30 MG 24 hr tablet TAKE ONE TABLET BY MOUTH EVERYDAY AT BEDTIME   Lancets  (ONETOUCH ULTRASOFT) lancets Use to check blood sugars TID. Dx: E11.9   losartan (COZAAR) 100 MG tablet TAKE ONE TABLET BY MOUTH EVERYDAY AT BEDTIME   metFORMIN (GLUCOPHAGE) 500 MG tablet TAKE ONE TABLET BY MOUTH TWICE DAILY   nitroGLYCERIN (NITROSTAT) 0.4 MG SL tablet Place 1 tablet (0.4 mg total) under the tongue every 5 (five) minutes as needed for chest pain.   ondansetron (ZOFRAN) 4 MG tablet Take 4 mg by mouth every 8 (eight) hours as needed.   oxyCODONE-acetaminophen (PERCOCET/ROXICET) 5-325 MG tablet Take 1 tablet by mouth 4 (four) times daily.   pantoprazole (PROTONIX) 40 MG tablet TAKE ONE TABLET BY MOUTH EVERY MORNING   Semaglutide, 1 MG/DOSE, 4 MG/3ML SOPN Inject 1 mg as directed once a week.   tamsulosin (FLOMAX) 0.4 MG CAPS capsule TAKE ONE TABLET BY MOUTH EVERY MORNING and TAKE ONE TABLET BY MOUTH EVERYDAY AT BEDTIME   No facility-administered encounter medications on file as of 10/21/2021.   BP Readings from Last 3 Encounters:  09/05/21 130/77  06/09/21 122/74  05/05/21 126/72    Lab Results  Component Value Date   HGBA1C 7.2 (H) 06/09/2021    No OVs, Consults, or hospital visits since last care coordination call / Pharmacist visit. No medication changes indicated  Last adherence delivery date: 08/04/2021      Patient is due for next adherence delivery on:  10/31/2021  Spoke with patient on 10/22/2021 reviewed medications and coordinated delivery.  This delivery to include: Adherence Packaging  30 Days  Packs: Atorvastatin 51m- 1 tablet daily (1 breakfast) Amlodipine 157m 1 tablet daily (1 breakfast) Pantoprazole 40 mg- 1 tablet daily (1 breakfast) Glimepiride 28m6m1 tablet twice daily (1 breakfast, 1 evening meal) Tamsulosin 0.28mg8mcapsule daily (1 breakfast, 1 bedtime)   Losartan 100mg26mtablet daily- (1 bedtime) Metformin 500mg 67mblet twice daily (1 tablet  breakfast, 1 tablet evening meal) Famotidine 20mg- 80mblet daily (1 bedtime) Isosorbide 30mg- 166mblet daily (1 bedtime) Carvedilol 6.25mg- 1 20met twice daily (1 breakfast, 1 evening meal) Aspirin 81mg- 1 t38mt daily (breakfast)   VIAL medications: None at this time  Patient declined:  Onetouch Verio test strips - PRN - Patient states he has a lot left.  Novolin N Flexpen 100 unit/mL (3 mL)- Inject 16 units twice daily (uses 3 days per week on average) - Patient states he has plenty left.   Patient receives through patient assistance:  Ozempic  1 mg weekly   Any concerns about your medications? No  How often do you forget or accidentally miss a dose? Never  Do you use a pillbox? No  Is patient in packaging Yes  If yes  What is the date on your next pill pack? 10/22/2021 Evening Meal  Any concerns or issues with your packaging? No  Refills requested from providers include: Onetouch Verio test strips - PRN Atorvastatin 40mg- 1 ta80m daily (1 breakfast)  Confirmed delivery date of 10/31/2021, advised patient that pharmacy will contact them the morning of delivery.   Recent blood pressure readings are as follows: Patient did not have any readings with him. Patient states blood pressure has been good.   Recent blood glucose readings are as follows: Patient states his range has been 100-150 in the morning. Patient did not have log with him at the time.   Patient is unsure if he would like to continue with Ozempic. Patient has noticed facial changes. Patient did some research and found out that a side effect is facial changes with skin losing elasticity. Patient has noticed that his face is doing that since being on Ozempic.   Patient also stated that he mailed back his patient assistance paperwork, but he will not qualify as he makes too much money. Patient no longer would like to continue the process with patient assistance.  Patient would like to do the most cost effective plan in regards to his diabetic medications.   I rescheduled patient's missed appointment on  10/21/2021 with Tony Manning at 8:45 am with Tony Manning stated his phone has been messed up and not allowing calls to come through. Patient said he has his phone working again.   Annual wellness visit in last year? No Most Recent BP reading: 130/77 on 09/05/2021  If Diabetic: Most recent A1C reading: 7.2 on 06/09/2021 Last eye exam / retinopathy screening: Up to date Last diabetic foot exam: Up to date  Tony AdDebbora Dusied  Olesya Wike, RMarijean NiemanncaProspect [phone] the care management and care coordination activities outlined in this encounter and I am certifying that I agree with the content of this note. No further action required.  Tony AdDebbora Dusinical Pharmacist [place] [phone]

## 2021-10-22 NOTE — Telephone Encounter (Signed)
Patient decided not to proceed with PAP at this time. States he had an income change this year and will not meet criteria. He would like to pursue low cost medications instead. Will discuss at [facility] follow up March 2023.

## 2021-10-23 DIAGNOSIS — M25551 Pain in right hip: Secondary | ICD-10-CM | POA: Diagnosis not present

## 2021-10-30 ENCOUNTER — Ambulatory Visit: Payer: Medicare HMO | Admitting: Family Medicine

## 2021-11-03 DIAGNOSIS — G894 Chronic pain syndrome: Secondary | ICD-10-CM | POA: Diagnosis not present

## 2021-11-03 DIAGNOSIS — Z79891 Long term (current) use of opiate analgesic: Secondary | ICD-10-CM | POA: Diagnosis not present

## 2021-11-03 DIAGNOSIS — M25551 Pain in right hip: Secondary | ICD-10-CM | POA: Diagnosis not present

## 2021-11-03 DIAGNOSIS — M4726 Other spondylosis with radiculopathy, lumbar region: Secondary | ICD-10-CM | POA: Diagnosis not present

## 2021-11-13 ENCOUNTER — Telehealth: Payer: Self-pay

## 2021-11-13 NOTE — Telephone Encounter (Signed)
Received call from patient states has been having blood sugar readings in the 60's can not get elevated. Having some weakness and feels "off". Call transferred to Access nurse for triage.  ?

## 2021-11-13 NOTE — Telephone Encounter (Signed)
[facility] - Client ?TELEPHONE ADVICE RECORD ?AccessNurse? ?Patient ?Name: ?Tony Manning ?Gender: Male ?DOB: [birth year] ?Age: 61 Y 21 M 9 D ?Return ?Phone ?Number: ?[id number] ?(Primary), ?[phone] ?(Secondary) ?Address: ?City/ ?State/ ?Zip: ?[place]  ?[address] ?Client [facility] - Client ?Client Site [facility] ?Provider Renford Dills - MD ?Contact Type Call ?Who Is Calling Patient / Member / Family / Caregiver ?Call Type Triage / Clinical ?Relationship To Patient Self ?Return Phone Number [phone] (Secondary) ?Chief Complaint Blood Sugar Low ?Reason for Call Symptomatic / Request for Health Information ?Initial Comment Caller states has patient for triage low 60's blood ?sugar, last reading was 80 a few minutes ago. Has ?weakness and feels off ?Translation No ?Nurse Assessment ?Nurse: Doyle Askew, RN, Beth Date/Time Eilene Ghazi Time): 11/13/2021 2:57:08 PM ?Confirm and document reason for call. If ?symptomatic, describe symptoms. ?---Caller states his BS was low (60's) blood sugar, last ?reading was 86 a few minutes ago, has weakness and ?feels off. ?Does the patient have any new or worsening ?symptoms? ---Yes ?Will a triage be completed? ---Yes ?Related visit to physician within the last 2 weeks? ---N/A ?Does the PT have any chronic conditions? (i.e. ?diabetes, asthma, this includes High risk factors for ?pregnancy, etc.) ?---Yes ?List chronic conditions. ---diabetes, heart problems ?Is this a behavioral health or substance abuse call? ---No ?Guidelines ?Guideline Title Affirmed Question Affirmed Notes Nurse Date/Time (Eastern ?Time) ?Diabetes - Low Blood ?Sugar ?Low blood sugar ?definition and ?treatment, questions ?about ?Doyle Askew, RN, [place] 11/13/2021 2:58:18 PM ?Disp. Time (Eastern ?Time) Disposition Final User ?11/13/2021 3:01:25 PM Home Care Yes Doyle Askew, RN, Beth ?PLEASE NOTE: All timestamps contained within this report are  represented as [place] Standard Time. ?CONFIDENTIALTY NOTICE: This fax transmission is intended only for the addressee. It contains information that is legally privileged, confidential or ?otherwise protected from use or disclosure. If you are not the intended recipient, you are strictly prohibited from reviewing, disclosing, copying using ?or disseminating any of this information or taking any action in reliance on or regarding this information. If you have received this fax in error, please ?notify us immediately by telephone so that we can arrange for its return to [place]. Phone: [phone], Toll-Free: [phone], Fax: [phone] ?Page: 2 of 2 ?Call Id: [id number] ?Caller Disagree/Comply Comply ?Caller Understands Yes ?PreDisposition Go to ED ?Care Advice Given Per Guideline ?* You should be able to treat this at home. HOME CARE: * Low blood sugar (hypoglycemia) is defined as a blood glucose less ?than 70 mg/dL (3.9 mmol/L). LOW BLOOD SUGAR (HYPOGLYCEMIA) - DEFINITION: * Symptoms of mild hypoglycemia: ?Shakiness, weakness, not thinking clearly, headache, trembling, sweating, dizziness, palpitations, and hunger. LOW BLOOD ?SUGAR - TREATMENT - EAT SOME (15-20 GRAMS) SUGAR NOW: * Each of the following has the right amount of sugar. ?* Milk (1 cup; 240 ml) * Pre-packaged juice box (1 box) * Skittles candy (15) * Table sugar or honey (3 teaspoons; 15 ml) * Fruit ?juice or non-diet soda (1/2 cup; 120 ml) * The symptoms should resolve in about 15 to 20 minutes. * The low blood sugar symptoms ?should start getting better in 5 to 10 minutes. LOW BLOOD SUGAR - EXPECTED COURSE: * If the low blood sugar symptoms ?are NOT BETTER IN 15 MINUTES, eat some more glucose (15 to 20 grams). CALL BACK IF: * You have more questions. * You ?become worse CARE ADVICE given per Diabetes - Low Blood Sugar (Adult) guideline ?

## 2021-11-13 NOTE — Telephone Encounter (Addendum)
Unable to reach pt by phone and I called Sheryl (DPR signed and she said pt went to work and she did not know pt was having low BS. Sheryl is not sure what BS is now. Sheryl will text pt to answer phone and I am to call him back in a min to see if he will answer phone.pt still not answering phone. I spoke with [facility] again and pt has not responded to her text. I will try to call again and Sheryl will cont to try to reach pt. I called pt and still no answer. 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 Asked pts wife when she talks with him to give UC & ED precautions and have pt call office with update.Tried calling pt again and left v/m with UC & ED precautions and asked pt to call [facility] with update. Sending note to Dr [name] who is aware I cannot reach pt and [facility] CMA. ?

## 2021-11-13 NOTE — Telephone Encounter (Signed)
Please check on patient and let me know about his situation.  This reportedly went to access nurse.  Thanks.   ?

## 2021-11-14 ENCOUNTER — Telehealth (INDEPENDENT_AMBULATORY_CARE_PROVIDER_SITE_OTHER): Payer: Medicare HMO | Admitting: Family

## 2021-11-14 ENCOUNTER — Encounter: Payer: Self-pay | Admitting: Family

## 2021-11-14 VITALS — Ht 70.0 in | Wt 197.0 lb

## 2021-11-14 DIAGNOSIS — U071 COVID-19: Secondary | ICD-10-CM | POA: Diagnosis not present

## 2021-11-14 MED ORDER — MOLNUPIRAVIR EUA 200MG CAPSULE: 4.0000 | ORAL_CAPSULE | Freq: Two times a day (BID) | ORAL | 0 refills | Status: AC

## 2021-11-14 NOTE — Telephone Encounter (Addendum)
Thank you for working diligently to contact patient.  I will await update from patient.   ?

## 2021-11-14 NOTE — Telephone Encounter (Signed)
Looks like patient did a VV with Tony Manning this am ?

## 2021-11-14 NOTE — Assessment & Plan Note (Signed)
Pt higher risk for complications, antiviral sent to pharmacy.  ?Advised of CDC guidelines for self isolation/ ending isolation.  Advised of safe practice guidelines. Symptom Tier reviewed.  Encouraged to monitor for any worsening symptoms; watch for increased shortness of breath, weakness, and signs of dehydration. Advised when to seek emergency care.  Instructed to rest and hydrate well.  Advised to leave the house during recommended isolation period, only if it is necessary to seek medical care ? ?

## 2021-11-14 NOTE — Progress Notes (Signed)
MyChart Video Visit    Virtual Visit via Video Note   This visit type was conducted due to national recommendations for restrictions regarding the COVID-19 Pandemic (e.g. social distancing) in an effort to limit this patient's exposure and mitigate transmission in our community. This patient is at least at moderate risk for complications without adequate follow up. This format is felt to be most appropriate for this patient at this time. Physical exam was limited by quality of the video and audio technology used for the visit. CMA was able to get the patient set up on a video visit.  Patient location: Home. Patient and provider in visit Provider location: Office  I discussed the limitations of evaluation and management by telemedicine and the availability of in person appointments. The patient expressed understanding and agreed to proceed.  Visit Date: 11/14/2021  Today's healthcare provider: Eugenia Pancoast, FNP     Subjective:    Patient ID: Tony Manning, male    DOB: [birth year], 61 y.o.   MRN: [id number]  Chief Complaint  Patient presents with   Nasal Congestion    Pt stated--congested, coughing, fever 100.1--started last night. COVID--positive. Taking claritin    HPI  Pt here via video visit with concerns.  Feeling nasal congestion, chest congestion, and coughing with productive sputum. Woke up with a fever of 100.1 F and body aches. Not feeling sob. Taking claritin right now which is his regular medicine.   Slight sore throat. No ear pain.  Tested covid positive   Past Medical History:  Diagnosis Date   Anginal pain (HCC)    Basal cell carcinoma of face    Chest pain, unspecified    Chronic kidney disease    kidney stones   Coronary atherosclerosis of native coronary artery    s/p stent x5   Depression    Diabetes mellitus without mention of complication    Diverticulosis    Dyspnea    Dysrhythmia    Erectile dysfunction    Essential hypertension,  benign    GERD (gastroesophageal reflux disease)    Heart murmur    History of kidney stones    History of nephrolithiasis    Hyperlipidemia    Migraine    Myocardial infarction ([place])    2016   Neuromuscular disorder ([place])    Osteoarthritis    Pneumonia    Postsurgical percutaneous transluminal coronary angioplasty status    s/p stent x4   Sarcoidosis    Sleep apnea    uses CPAP   Unspecified sleep apnea    uses C-pap    Past Surgical History:  Procedure Laterality Date   ANTERIOR CERVICAL DECOMP/DISCECTOMY FUSION N/A 04/07/2017   Procedure: ANTERIOR CERVICAL DECOMPRESSION FUSION, CERVICAL 4-5 WITH INSTRUMENTATION AND ALLOGRAFT;  Surgeon: Phylliss Bob, MD;  Location: [facility];  Service: Orthopedics;  Laterality: N/A;  ANTERIOR CERVICAL DECOMPRESSION FUSION, CERVICAL 4-5 WITH INSTRUMENTATION AND ALLOGRAFT; REQUEST 2.5 HOURS AND FLIP ROOM   APPENDECTOMY  2005   CARPAL TUNNEL RELEASE     CERVICAL FUSION  2009   COLONOSCOPY     CORONARY ANGIOPLASTY WITH STENT PLACEMENT     2004   CORONARY STENT INTERVENTION N/A 10/16/2016   Procedure: Coronary Stent Intervention;  Surgeon: Adrian Prows, MD;  Location: [facility];  Service: Cardiovascular;  Laterality: N/A;   CYSTOSCOPY WITH RETROGRADE PYELOGRAM, URETEROSCOPY AND STENT PLACEMENT Right 05/12/2019   Procedure: CYSTOSCOPY WITH RIGHT RETROGRADE PYELOGRAM, URETEROSCOPY HOLMIUM LASER AND STENT PLACEMENT;  Surgeon: Link Snuffer  D III, MD;  Location: [facility];  Service: Urology;  Laterality: Right;   ELBOW SURGERY     bilateral   HAND SURGERY Left    KNEE SURGERY Bilateral    Bilateral    LEFT HEART CATH AND CORONARY ANGIOGRAPHY N/A 10/16/2016   Procedure: Left Heart Cath and Coronary Angiography;  Surgeon: Adrian Prows, MD;  Location: [facility];  Service: Cardiovascular;  Laterality: N/A;   LEFT HEART CATHETERIZATION WITH CORONARY ANGIOGRAM N/A 06/14/2014   Procedure: LEFT HEART CATHETERIZATION WITH CORONARY ANGIOGRAM;   Surgeon: Laverda Page, MD;  Location: [facility];  Service: Cardiovascular;  Laterality: N/A;   LUNG BIOPSY     sarcoid   ROTATOR CUFF REPAIR     left   SHOULDER ARTHROSCOPY WITH SUBACROMIAL DECOMPRESSION Left 06/16/2018   Procedure: LEFT SHOULDER ARTHROSCOPY WITH ROTATOR CUFF DEBRIDEMENT AND  SUBACROMIAL DECOMPRESSION;  Surgeon: Tania Ade, MD;  Location: [facility];  Service: Orthopedics;  Laterality: Left;    Family History  Problem Relation Age of Onset   Arthritis Mother    Depression Mother    Stroke Mother    Arthritis Father    Diabetes Father    Coronary artery disease Father    Colon cancer Neg Hx    Prostate cancer Neg Hx    Esophageal cancer Neg Hx    Rectal cancer Neg Hx    Stomach cancer Neg Hx     Social History   Socioeconomic History   Marital status: Married    Spouse name: Not on file   Number of children: 2   Years of education: Not on file   Highest education level: Not on file  Occupational History   Occupation: disabled    Employer: unemployed  Tobacco Use   Smoking status: Never   Smokeless tobacco: Former    Types: Chew    Quit date: 02/06/2016  Vaping Use   Vaping Use: Never used  Substance and Sexual Activity   Alcohol use: No    Alcohol/week: 0.0 standard drinks   Drug use: No   Sexual activity: Yes    Birth control/protection: Condom  Other Topics Concern   Not on file  Social History Narrative   Married, [phone] kids   On disability   Social Determinants of Health   Financial Resource Strain: Medium Risk   Difficulty of Paying Living Expenses: Somewhat hard  Food Insecurity: Not on file  Transportation Needs: Not on file  Physical Activity: Not on file  Stress: Not on file  Social Connections: Not on file  Intimate Partner Violence: Not on file    Outpatient Medications Prior to Visit  Medication Sig Dispense Refill   amitriptyline (ELAVIL) 10 MG tablet Take 1 tablet (10 mg total) by mouth at bedtime as needed for  sleep.     amLODipine (NORVASC) 10 MG tablet TAKE ONE TABLET BY MOUTH EVERY MORNING 90 tablet 3   aspirin 81 MG chewable tablet Chew 81 mg by mouth at bedtime.     atorvastatin (LIPITOR) 40 MG tablet TAKE ONE TABLET BY MOUTH EVERY MORNING 90 tablet 0   Blood Glucose Monitoring Suppl (ONETOUCH VERIO) w/Device KIT Use to check blood sugars TID. Dx: E11.9 1 kit 0   carvedilol (COREG) 6.25 MG tablet TAKE ONE TABLET BY MOUTH TWICE DAILY 180 tablet 2   cyanocobalamin (,VITAMIN B-12,) 1000 MCG/ML injection Inject 1 mL (1,000 mcg total) into the muscle every 14 (fourteen) days. 1 mL    cyclobenzaprine (  FLEXERIL) 5 MG tablet Take 5 mg by mouth 3 (three) times daily as needed for muscle spasms.     diclofenac Sodium (VOLTAREN) 1 % GEL Apply 2-4 g topically 4 (four) times daily. 100 g 5   famotidine (PEPCID) 20 MG tablet TAKE ONE TABLET BY MOUTH EVERYDAY AT BEDTIME 90 tablet 0   glimepiride (AMARYL) 4 MG tablet TAKE ONE TABLET BY MOUTH TWICE DAILY 180 tablet 1   glucose blood (ONETOUCH VERIO) test strip USE TO check blood glucose THREE TIMES DAILY AS DIRECTED 300 strip 12   Insulin NPH, Human,, Isophane, (NOVOLIN N FLEXPEN) 100 UNIT/ML Kiwkpen Inject 16 Units into the skin in the morning and at bedtime. 15 mL 11   Insulin Pen Needle 32G X 4 MM MISC Use daily with insulin pen. 100 each 3   Insulin Syringe-Needle U-100 (INSULIN SYRINGE 1CC/31GX5/16") 31G X 5/16" 1 ML MISC Use with NPH insulin twice daily. 100 each 3   isosorbide mononitrate (IMDUR) 30 MG 24 hr tablet TAKE ONE TABLET BY MOUTH EVERYDAY AT BEDTIME 90 tablet 2   Lancets (ONETOUCH ULTRASOFT) lancets Use to check blood sugars TID. Dx: E11.9 300 each 12   losartan (COZAAR) 100 MG tablet TAKE ONE TABLET BY MOUTH EVERYDAY AT BEDTIME 90 tablet 1   metFORMIN (GLUCOPHAGE) 500 MG tablet TAKE ONE TABLET BY MOUTH TWICE DAILY 180 tablet 2   nitroGLYCERIN (NITROSTAT) 0.4 MG SL tablet Place 1 tablet (0.4 mg total) under the tongue every 5 (five) minutes as  needed for chest pain. 25 tablet 3   ondansetron (ZOFRAN) 4 MG tablet Take 4 mg by mouth every 8 (eight) hours as needed.     oxyCODONE-acetaminophen (PERCOCET/ROXICET) 5-325 MG tablet Take 1 tablet by mouth 4 (four) times daily.     pantoprazole (PROTONIX) 40 MG tablet TAKE ONE TABLET BY MOUTH EVERY MORNING 30 tablet 3   Semaglutide, 1 MG/DOSE, 4 MG/3ML SOPN Inject 1 mg as directed once a week. 3 mL    tamsulosin (FLOMAX) 0.4 MG CAPS capsule TAKE ONE TABLET BY MOUTH EVERY MORNING and TAKE ONE TABLET BY MOUTH EVERYDAY AT BEDTIME 60 capsule 1   No facility-administered medications prior to visit.    Allergies  Allergen Reactions   Ambien [Zolpidem] Other (See Comments)    Parasomnias, sleep walking   Lisinopril Cough   Tramadol Diarrhea    Review of Systems  Constitutional:  Positive for fever. Negative for chills.  HENT:  Positive for congestion, postnasal drip, rhinorrhea and sore throat. Negative for ear pain and sinus pain.   Respiratory:  Positive for cough and chest tightness. Negative for shortness of breath and wheezing.   Cardiovascular:  Negative for chest pain and palpitations.      Objective:    Physical Exam Vitals reviewed.  Constitutional:      General: He is not in acute distress.    Appearance: Normal appearance. He is not ill-appearing, toxic-appearing or diaphoretic.  Pulmonary:     Effort: Pulmonary effort is normal.  Neurological:     General: No focal deficit present.     Mental Status: He is alert and oriented to person, place, and time.  Psychiatric:        Mood and Affect: Mood normal.        Behavior: Behavior normal.        Thought Content: Thought content normal.        Judgment: Judgment normal.    Ht $R'5\' 10"'lM$  (1.778 m)    Wt  197 lb (89.4 kg)    BMI 28.27 kg/m  Wt Readings from Last 3 Encounters:  11/14/21 197 lb (89.4 kg)  09/05/21 191 lb (86.6 kg)  06/09/21 200 lb (90.7 kg)       Assessment & Plan:   Problem List Items Addressed This  Visit       Other   COVID-19 - Primary    Pt higher risk for complications, antiviral sent to pharmacy.  Advised of CDC guidelines for self isolation/ ending isolation.  Advised of safe practice guidelines. Symptom Tier reviewed.  Encouraged to monitor for any worsening symptoms; watch for increased shortness of breath, weakness, and signs of dehydration. Advised when to seek emergency care.  Instructed to rest and hydrate well.  Advised to leave the house during recommended isolation period, only if it is necessary to seek medical care       Relevant Medications   molnupiravir EUA (LAGEVRIO) 200 mg CAPS capsule    I am having Sharon [place] "Gerald Stabs" start on molnupiravir EUA. I am also having him maintain his aspirin, Insulin Pen Needle, nitroGLYCERIN, INSULIN SYRINGE 1CC/31GX5/16", diclofenac Sodium, ondansetron, OneTouch Verio, onetouch ultrasoft, amitriptyline, NovoLIN N FlexPen, cyanocobalamin, cyclobenzaprine, carvedilol, glimepiride, isosorbide mononitrate, Semaglutide (1 MG/DOSE), amLODipine, losartan, famotidine, pantoprazole, oxyCODONE-acetaminophen, tamsulosin, metFORMIN, OneTouch Verio, and atorvastatin.  Meds ordered this encounter  Medications   molnupiravir EUA (LAGEVRIO) 200 mg CAPS capsule    Sig: Take 4 capsules (800 mg total) by mouth 2 (two) times daily for 5 days.    Dispense:  40 capsule    Refill:  0    Order Specific Question:   Supervising Provider    Answer:   BEDSOLE, AMY E [[id number]]    I discussed the assessment and treatment plan with the patient. The patient was provided an opportunity to ask questions and all were answered. The patient agreed with the plan and demonstrated an understanding of the instructions.   The patient was advised to call back or seek an in-person evaluation if the symptoms worsen or if the condition fails to improve as anticipated.  I provided 18 minutes of face-to-face time during this encounter.   Eugenia Pancoast, [place] [facility] [place] [phone] (phone) [phone] (fax)  [place]

## 2021-11-14 NOTE — Telephone Encounter (Signed)
Noted. Thanks.

## 2021-11-14 NOTE — Patient Instructions (Signed)
Your COVID19 PCR test is POSITIVE. ? ?Let us know right away if any worsening shortness of breath or persistent productive cough or fever.  ? ?Follow these current CDC guidelines for self-isolation: ? ?- Stay home for 5 days, starting the day after your symptoms (The first day is really day 0). ?- If you have no symptoms or your symptoms are resolving after 5 days, you can leave your house. ?- Continue to wear a mask around others for 5 additional days. ?**If you have a fever, continue to stay home until your fever resolves for 24 hours without fever-reducing medications.** ? ?

## 2021-11-16 ENCOUNTER — Other Ambulatory Visit: Payer: Self-pay | Admitting: Family Medicine

## 2021-11-18 ENCOUNTER — Telehealth: Payer: Self-pay

## 2021-11-18 ENCOUNTER — Ambulatory Visit: Payer: Medicare HMO | Admitting: Family Medicine

## 2021-11-18 NOTE — Progress Notes (Signed)
? ? ?Chronic Care Management ?Pharmacy Assistant  ? ?Name: Tony Manning  MRN: [id number] DOB: [birth year] ? ?Reason for Encounter: CCM Counsellor) ?  ?Medications: ?Outpatient Encounter Medications as of 11/18/2021  ?Medication Sig  ? amitriptyline (ELAVIL) 10 MG tablet Take 1 tablet (10 mg total) by mouth at bedtime as needed for sleep.  ? amLODipine (NORVASC) 10 MG tablet TAKE ONE TABLET BY MOUTH EVERY MORNING  ? aspirin 81 MG chewable tablet Chew 81 mg by mouth at bedtime.  ? atorvastatin (LIPITOR) 40 MG tablet TAKE ONE TABLET BY MOUTH EVERY MORNING  ? Blood Glucose Monitoring Suppl (ONETOUCH VERIO) w/Device KIT Use to check blood sugars TID. Dx: E11.9  ? carvedilol (COREG) 6.25 MG tablet TAKE ONE TABLET BY MOUTH TWICE DAILY  ? cyanocobalamin (,VITAMIN B-12,) 1000 MCG/ML injection Inject 1 mL (1,000 mcg total) into the muscle every 14 (fourteen) days.  ? cyclobenzaprine (FLEXERIL) 5 MG tablet Take 5 mg by mouth 3 (three) times daily as needed for muscle spasms.  ? diclofenac Sodium (VOLTAREN) 1 % GEL Apply 2-4 g topically 4 (four) times daily.  ? famotidine (PEPCID) 20 MG tablet TAKE ONE TABLET BY MOUTH EVERYDAY AT BEDTIME  ? glimepiride (AMARYL) 4 MG tablet TAKE ONE TABLET BY MOUTH TWICE DAILY  ? glucose blood (ONETOUCH VERIO) test strip USE TO check blood glucose THREE TIMES DAILY AS DIRECTED  ? Insulin NPH, Human,, Isophane, (NOVOLIN N FLEXPEN) 100 UNIT/ML Kiwkpen Inject 16 Units into the skin in the morning and at bedtime.  ? Insulin Pen Needle 32G X 4 MM MISC Use daily with insulin pen.  ? Insulin Syringe-Needle U-100 (INSULIN SYRINGE 1CC/31GX5/16") 31G X 5/16" 1 ML MISC Use with NPH insulin twice daily.  ? isosorbide mononitrate (IMDUR) 30 MG 24 hr tablet TAKE ONE TABLET BY MOUTH EVERYDAY AT BEDTIME  ? Lancets (ONETOUCH ULTRASOFT) lancets Use to check blood sugars TID. Dx: E11.9  ? losartan (COZAAR) 100 MG tablet TAKE ONE TABLET BY MOUTH EVERYDAY AT BEDTIME  ? metFORMIN (GLUCOPHAGE) 500 MG  tablet TAKE ONE TABLET BY MOUTH TWICE DAILY  ? molnupiravir EUA (LAGEVRIO) 200 mg CAPS capsule Take 4 capsules (800 mg total) by mouth 2 (two) times daily for 5 days.  ? nitroGLYCERIN (NITROSTAT) 0.4 MG SL tablet Place 1 tablet (0.4 mg total) under the tongue every 5 (five) minutes as needed for chest pain.  ? ondansetron (ZOFRAN) 4 MG tablet Take 4 mg by mouth every 8 (eight) hours as needed.  ? oxyCODONE-acetaminophen (PERCOCET/ROXICET) 5-325 MG tablet Take 1 tablet by mouth 4 (four) times daily.  ? pantoprazole (PROTONIX) 40 MG tablet TAKE ONE TABLET BY MOUTH EVERY MORNING  ? Semaglutide, 1 MG/DOSE, 4 MG/3ML SOPN Inject 1 mg as directed once a week.  ? tamsulosin (FLOMAX) 0.4 MG CAPS capsule TAKE ONE CAPSULE BY MOUTH EVERY MORNING and TAKE ONE CAPSULE BY MOUTH EVERYDAY AT BEDTIME  ? ?No facility-administered encounter medications on file as of 11/18/2021.  ? ?Tony Manning was contacted to remind of upcoming telephone visit with Charlene Brooke on 11/21/2021 at 8:45 am. Patient was reminded to have any blood glucose and blood pressure readings available for review at appointment. If unable to reach, a voicemail was left for patient.  ? ?Are you having any problems with your medications? No  ? ?Do you have any concerns you like to discuss with the pharmacist? No ? ?Star Rating Drugs: ?Medication:  Last Fill: Day Supply ?Atorvastatin 40 mg 10/27/2021 90 ?Metformin 500 mg 10/27/2021  90  ?Losartan 100 mg 10/27/2021 30 ?Glimepiride 4 mg 10/27/2021 30  ? ?Charlene Brooke, CPP notified ? ?Marijean Niemann, RMA ?Clinical Pharmacy Assistant ?[phone] ? ? ? ? ? ? ?

## 2021-11-19 ENCOUNTER — Telehealth (INDEPENDENT_AMBULATORY_CARE_PROVIDER_SITE_OTHER): Payer: Medicare HMO | Admitting: Family

## 2021-11-19 ENCOUNTER — Other Ambulatory Visit: Payer: Self-pay | Admitting: Family Medicine

## 2021-11-19 ENCOUNTER — Other Ambulatory Visit: Payer: Self-pay

## 2021-11-19 ENCOUNTER — Other Ambulatory Visit: Payer: Self-pay | Admitting: Adult Health

## 2021-11-19 VITALS — Ht 70.0 in | Wt 194.0 lb

## 2021-11-19 DIAGNOSIS — U071 COVID-19: Secondary | ICD-10-CM

## 2021-11-19 MED ORDER — AMOXICILLIN-POT CLAVULANATE 875-125 MG PO TABS: 1.0000 | ORAL_TABLET | Freq: Two times a day (BID) | ORAL | 0 refills | Status: DC

## 2021-11-19 NOTE — Assessment & Plan Note (Signed)
Sent RX for augmentin,  ? Advised of CDC guidelines for self isolation/ ending isolation.  Advised of safe practice guidelines. Symptom Tier reviewed.  Encouraged to monitor for any worsening symptoms; watch for increased shortness of breath, weakness, and signs of dehydration. Advised when to seek emergency care.  Instructed to rest and hydrate well.  Advised to leave the house during recommended isolation period, only if it is necessary to seek medical care ? ?

## 2021-11-19 NOTE — Progress Notes (Signed)
MyChart Video Visit    Virtual Visit via Video Note   This visit type was conducted due to national recommendations for restrictions regarding the COVID-19 Pandemic (e.g. social distancing) in an effort to limit this patient's exposure and mitigate transmission in our community. This patient is at least at moderate risk for complications without adequate follow up. This format is felt to be most appropriate for this patient at this time. Physical exam was limited by quality of the video and audio technology used for the visit. CMA was able to get the patient set up on a video visit.  Patient location: Home. Patient and provider in visit Provider location: Office  I discussed the limitations of evaluation and management by telemedicine and the availability of in person appointments. The patient expressed understanding and agreed to proceed.  Visit Date: 11/19/2021  Today's healthcare provider: Mort Sawyers, FNP     Subjective:    Patient ID: Tony Manning, male    DOB: 1960/12/09, 61 y.o.   MRN: [id number]  Chief Complaint  Patient presents with   Covid Positive    Pt still congested, fever 100.0, body ache    HPI  61 y/o male still with concerns.  Covid positive 11/14/21.  Still with some nasal congestion, fever up to 100 F last night and still with body aches (although improved). Increased fatigue. Finished antiviral medication. No sob. Mild chest congestion. Does have a cough that is productive with some clear sputum. No ear pain no sore throat, some sinus pressure.   Taking mucinex.   Past Medical History:  Diagnosis Date   Anginal pain (HCC)    Basal cell carcinoma of face    Chest pain, unspecified    Chronic kidney disease    kidney stones   Coronary atherosclerosis of native coronary artery    s/p stent x5   Depression    Diabetes mellitus without mention of complication    Diverticulosis    Dyspnea    Dysrhythmia    Erectile dysfunction     Essential hypertension, benign    GERD (gastroesophageal reflux disease)    Heart murmur    History of kidney stones    History of nephrolithiasis    Hyperlipidemia    Migraine    Myocardial infarction (HCC)    2016   Neuromuscular disorder (HCC)    Osteoarthritis    Pneumonia    Postsurgical percutaneous transluminal coronary angioplasty status    s/p stent x4   Sarcoidosis    Sleep apnea    uses CPAP   Unspecified sleep apnea    uses C-pap    Past Surgical History:  Procedure Laterality Date   ANTERIOR CERVICAL DECOMP/DISCECTOMY FUSION N/A 04/07/2017   Procedure: ANTERIOR CERVICAL DECOMPRESSION FUSION, CERVICAL 4-5 WITH INSTRUMENTATION AND ALLOGRAFT;  Surgeon: Estill Bamberg, MD;  Location: [facility] OR;  Service: Orthopedics;  Laterality: N/A;  ANTERIOR CERVICAL DECOMPRESSION FUSION, CERVICAL 4-5 WITH INSTRUMENTATION AND ALLOGRAFT; REQUEST 2.5 HOURS AND FLIP ROOM   APPENDECTOMY  2005   CARPAL TUNNEL RELEASE     CERVICAL FUSION  2009   COLONOSCOPY     CORONARY ANGIOPLASTY WITH STENT PLACEMENT     2004   CORONARY STENT INTERVENTION N/A 10/16/2016   Procedure: Coronary Stent Intervention;  Surgeon: Yates Decamp, MD;  Location: [facility] INVASIVE CV LAB;  Service: Cardiovascular;  Laterality: N/A;   CYSTOSCOPY WITH RETROGRADE PYELOGRAM, URETEROSCOPY AND STENT PLACEMENT Right 05/12/2019   Procedure: CYSTOSCOPY WITH RIGHT RETROGRADE PYELOGRAM, URETEROSCOPY HOLMIUM  LASER AND STENT PLACEMENT;  Surgeon: Crista Elliot, MD;  Location: [facility] ORS;  Service: Urology;  Laterality: Right;   ELBOW SURGERY     bilateral   HAND SURGERY Left    KNEE SURGERY Bilateral    Bilateral    LEFT HEART CATH AND CORONARY ANGIOGRAPHY N/A 10/16/2016   Procedure: Left Heart Cath and Coronary Angiography;  Surgeon: Yates Decamp, MD;  Location: [facility] INVASIVE CV LAB;  Service: Cardiovascular;  Laterality: N/A;   LEFT HEART CATHETERIZATION WITH CORONARY ANGIOGRAM N/A 06/14/2014   Procedure: LEFT HEART CATHETERIZATION WITH  CORONARY ANGIOGRAM;  Surgeon: Pamella Pert, MD;  Location: [facility] CATH LAB;  Service: Cardiovascular;  Laterality: N/A;   LUNG BIOPSY     sarcoid   ROTATOR CUFF REPAIR     left   SHOULDER ARTHROSCOPY WITH SUBACROMIAL DECOMPRESSION Left 06/16/2018   Procedure: LEFT SHOULDER ARTHROSCOPY WITH ROTATOR CUFF DEBRIDEMENT AND  SUBACROMIAL DECOMPRESSION;  Surgeon: Jones Broom, MD;  Location: [facility] OR;  Service: Orthopedics;  Laterality: Left;    Family History  Problem Relation Age of Onset   Arthritis Mother    Depression Mother    Stroke Mother    Arthritis Father    Diabetes Father    Coronary artery disease Father    Colon cancer Neg Hx    Prostate cancer Neg Hx    Esophageal cancer Neg Hx    Rectal cancer Neg Hx    Stomach cancer Neg Hx     Social History   Socioeconomic History   Marital status: Married    Spouse name: Not on file   Number of children: 2   Years of education: Not on file   Highest education level: Not on file  Occupational History   Occupation: disabled    Employer: unemployed  Tobacco Use   Smoking status: Never   Smokeless tobacco: Former    Types: Chew    Quit date: 02/06/2016  Vaping Use   Vaping Use: Never used  Substance and Sexual Activity   Alcohol use: No    Alcohol/week: 0.0 standard drinks   Drug use: No   Sexual activity: Yes    Birth control/protection: Condom  Other Topics Concern   Not on file  Social History Narrative   Married, [phone] kids   On disability   Social Determinants of Health   Financial Resource Strain: Medium Risk   Difficulty of Paying Living Expenses: Somewhat hard  Food Insecurity: Not on file  Transportation Needs: Not on file  Physical Activity: Not on file  Stress: Not on file  Social Connections: Not on file  Intimate Partner Violence: Not on file    Outpatient Medications Prior to Visit  Medication Sig Dispense Refill   amitriptyline (ELAVIL) 10 MG tablet Take 1 tablet (10 mg total) by mouth at  bedtime as needed for sleep.     amLODipine (NORVASC) 10 MG tablet TAKE ONE TABLET BY MOUTH EVERY MORNING 90 tablet 3   aspirin 81 MG chewable tablet Chew 81 mg by mouth at bedtime.     atorvastatin (LIPITOR) 40 MG tablet TAKE ONE TABLET BY MOUTH EVERY MORNING 90 tablet 0   Blood Glucose Monitoring Suppl (ONETOUCH VERIO) w/Device KIT Use to check blood sugars TID. Dx: E11.9 1 kit 0   carvedilol (COREG) 6.25 MG tablet TAKE ONE TABLET BY MOUTH TWICE DAILY 180 tablet 2   cyanocobalamin (,VITAMIN B-12,) 1000 MCG/ML injection Inject 1 mL (1,000 mcg total) into the muscle every 14 (  fourteen) days. 1 mL    cyclobenzaprine (FLEXERIL) 5 MG tablet Take 5 mg by mouth 3 (three) times daily as needed for muscle spasms.     diclofenac Sodium (VOLTAREN) 1 % GEL Apply 2-4 g topically 4 (four) times daily. 100 g 5   famotidine (PEPCID) 20 MG tablet TAKE ONE TABLET BY MOUTH EVERYDAY AT BEDTIME 90 tablet 0   glimepiride (AMARYL) 4 MG tablet TAKE ONE TABLET BY MOUTH TWICE DAILY 180 tablet 1   glucose blood (ONETOUCH VERIO) test strip USE TO check blood glucose THREE TIMES DAILY AS DIRECTED 300 strip 12   Insulin NPH, Human,, Isophane, (NOVOLIN N FLEXPEN) 100 UNIT/ML Kiwkpen Inject 16 Units into the skin in the morning and at bedtime. 15 mL 11   Insulin Pen Needle 32G X 4 MM MISC Use daily with insulin pen. 100 each 3   Insulin Syringe-Needle U-100 (INSULIN SYRINGE 1CC/31GX5/16") 31G X 5/16" 1 ML MISC Use with NPH insulin twice daily. 100 each 3   isosorbide mononitrate (IMDUR) 30 MG 24 hr tablet TAKE ONE TABLET BY MOUTH EVERYDAY AT BEDTIME 90 tablet 2   Lancets (ONETOUCH ULTRASOFT) lancets Use to check blood sugars TID. Dx: E11.9 300 each 12   losartan (COZAAR) 100 MG tablet TAKE ONE TABLET BY MOUTH EVERYDAY AT BEDTIME 90 tablet 1   metFORMIN (GLUCOPHAGE) 500 MG tablet TAKE ONE TABLET BY MOUTH TWICE DAILY 180 tablet 2   molnupiravir EUA (LAGEVRIO) 200 mg CAPS capsule Take 4 capsules (800 mg total) by mouth 2 (two)  times daily for 5 days. 40 capsule 0   nitroGLYCERIN (NITROSTAT) 0.4 MG SL tablet Place 1 tablet (0.4 mg total) under the tongue every 5 (five) minutes as needed for chest pain. 25 tablet 3   ondansetron (ZOFRAN) 4 MG tablet Take 4 mg by mouth every 8 (eight) hours as needed.     oxyCODONE-acetaminophen (PERCOCET/ROXICET) 5-325 MG tablet Take 1 tablet by mouth 4 (four) times daily.     pantoprazole (PROTONIX) 40 MG tablet TAKE ONE TABLET BY MOUTH EVERY MORNING 30 tablet 3   Semaglutide, 1 MG/DOSE, 4 MG/3ML SOPN Inject 1 mg as directed once a week. 3 mL    tamsulosin (FLOMAX) 0.4 MG CAPS capsule TAKE ONE CAPSULE BY MOUTH EVERY MORNING and TAKE ONE CAPSULE BY MOUTH EVERYDAY AT BEDTIME 60 capsule 1   No facility-administered medications prior to visit.    Allergies  Allergen Reactions   Ambien [Zolpidem] Other (See Comments)    Parasomnias, sleep walking   Lisinopril Cough   Tramadol Diarrhea    Review of Systems  Constitutional:  Negative for chills and fever.  HENT:  Positive for congestion and sinus pain (sinus pressure). Negative for ear pain and sore throat.   Respiratory:  Positive for cough and sputum production. Negative for shortness of breath (clear) and wheezing.   Cardiovascular:  Negative for chest pain, palpitations and leg swelling.  All other systems reviewed and are negative.     Objective:    Physical Exam Constitutional:      General: He is not in acute distress.    Appearance: Normal appearance. He is obese. He is not ill-appearing, toxic-appearing or diaphoretic.  Pulmonary:     Effort: Pulmonary effort is normal.  Neurological:     General: No focal deficit present.     Mental Status: He is alert and oriented to person, place, and time.  Psychiatric:        Mood and Affect: Mood normal.  Behavior: Behavior normal.        Thought Content: Thought content normal.        Judgment: Judgment normal.    Ht 5\' 10"  (1.778 m)   Wt 194 lb (88 kg)   BMI  27.84 kg/m  Wt Readings from Last 3 Encounters:  11/19/21 194 lb (88 kg)  11/14/21 197 lb (89.4 kg)  09/05/21 191 lb (86.6 kg)       Assessment & Plan:   Problem List Items Addressed This Visit       Other   COVID-19 - Primary    Sent RX for augmentin,   Advised of CDC guidelines for self isolation/ ending isolation.  Advised of safe practice guidelines. Symptom Tier reviewed.  Encouraged to monitor for any worsening symptoms; watch for increased shortness of breath, weakness, and signs of dehydration. Advised when to seek emergency care.  Instructed to rest and hydrate well.  Advised to leave the house during recommended isolation period, only if it is necessary to seek medical care       Relevant Medications   amoxicillin-clavulanate (AUGMENTIN) 875-125 MG tablet    I am having Stephanie Coup. [place] "Thayer Ohm" start on amoxicillin-clavulanate. I am also having him maintain his aspirin, Insulin Pen Needle, nitroGLYCERIN, INSULIN SYRINGE 1CC/31GX5/16", diclofenac Sodium, ondansetron, OneTouch Verio, onetouch ultrasoft, amitriptyline, NovoLIN N FlexPen, cyanocobalamin, cyclobenzaprine, carvedilol, glimepiride, isosorbide mononitrate, Semaglutide (1 MG/DOSE), amLODipine, losartan, famotidine, pantoprazole, oxyCODONE-acetaminophen, metFORMIN, OneTouch Verio, atorvastatin, molnupiravir EUA, and tamsulosin.  Meds ordered this encounter  Medications   amoxicillin-clavulanate (AUGMENTIN) 875-125 MG tablet    Sig: Take 1 tablet by mouth 2 (two) times daily.    Dispense:  20 tablet    Refill:  0    Order Specific Question:   Supervising Provider    Answer:   BEDSOLE, AMY E [[id number]]    I discussed the assessment and treatment plan with the patient. The patient was provided an opportunity to ask questions and all were answered. The patient agreed with the plan and demonstrated an understanding of the instructions.   The patient was advised to call back or seek an in-person evaluation if the  symptoms worsen or if the condition fails to improve as anticipated.  I provided 17 minutes of face-to-face time during this encounter.   Mort Sawyers, FNP [facility] [place] [phone] (phone) [phone] (fax)  [facility]

## 2021-11-21 ENCOUNTER — Telehealth: Payer: Self-pay | Admitting: Pharmacist

## 2021-11-21 ENCOUNTER — Ambulatory Visit (INDEPENDENT_AMBULATORY_CARE_PROVIDER_SITE_OTHER): Payer: Medicare HMO | Admitting: Pharmacist

## 2021-11-21 ENCOUNTER — Other Ambulatory Visit: Payer: Self-pay

## 2021-11-21 DIAGNOSIS — E1149 Type 2 diabetes mellitus with other diabetic neurological complication: Secondary | ICD-10-CM

## 2021-11-21 DIAGNOSIS — I10 Essential (primary) hypertension: Secondary | ICD-10-CM

## 2021-11-21 DIAGNOSIS — N189 Chronic kidney disease, unspecified: Secondary | ICD-10-CM

## 2021-11-21 DIAGNOSIS — E7800 Pure hypercholesterolemia, unspecified: Secondary | ICD-10-CM

## 2021-11-21 NOTE — Telephone Encounter (Signed)
Patient has [employer] and reports copay for Ozempic is cost prohibitive at this time. ? ?Reviewed application process for [employer] patient assistance program. Patient meets income/out of pocket spend criteria for the program. Patient will provide proof of income, out of pocket spend report, and will sign application. Will collaborate with prescriber Dr [name] for the provider portion of application. Once completed, application will be submitted via Fax  ? ?Patient assistance program Fax number: [phone] ? ?Forms printed. Patient section placed in front office - patient documents folder. Provider section placed in Dr Josefine Class box. ?Please return all forms to PharmD for processing once complete. ? ?Charlton Haws, RPH ? ? ? ? ?

## 2021-11-21 NOTE — Patient Instructions (Signed)
Visit Information ? ?Phone number for Pharmacist: [phone] ? ? Goals Addressed   ?None ?  ? ? ?Care Plan : [place]  ?Updates made by Charlton Haws, RPH since 11/21/2021 12:00 AM  ?  ? ?Problem: Hypertension, Hyperlipidemia, Diabetes, Coronary Artery Disease, and Chronic Kidney Disease   ?Priority: High  ?  ? ?Long-Range Goal: Disease mgmt   ?Start Date: 11/27/2020  ?Expected End Date: 11/22/2022  ?This Visit's Progress: On track  ?Priority: High  ?Note:   ?Current Barriers:  ?Uncontrolled diabetes ? ?Pharmacist Clinical Goal(s):  ?Patient will adhere to plan to optimize therapeutic regimen for diabetes as evidenced by report of adherence to recommended medication management changes through collaboration with PharmD and provider.  ? ?Interventions: ?1:1 collaboration with Tonia Ghent, MD regarding development and update of comprehensive plan of care as evidenced by provider attestation and co-signature ?Inter-disciplinary care team collaboration (see longitudinal plan of care) ?Comprehensive medication review performed; medication list updated in electronic medical record ? ?Hypertension / CKD Stage 3b (BP goal <130/80) ?-Controlled - BP at goal in office  ?-Current treatment: ?Amodipine 10 mg daily - Appropriate, Effective, Safe, Accessible ?Carvedilol 6.25 mg BID -Appropriate, Effective, Safe, Accessible ?Isosorbide MN 30 mg HS -Appropriate, Effective, Safe, Accessible ?Losartan 100 mg daily -Appropriate, Effective, Safe, Accessible ?-Medications previously tried: n/a  ?-Educated on BP goals and benefits of medications for prevention of heart attack, stroke and kidney damage; ?-Counseled to monitor BP at home periodically, document, and provide log at future appointments ?-Recommended to continue current medication ? ?Hyperlipidemia / CAD (LDL goal < 70) ?-Controlled - LDL 59 ?-Hx CAD. NSTEMI 06/2014 ?-Current treatment: ?Atorvastatin 40 mg daily ?Aspirin 81 mg daily ?Isosorbide MN 30 mg  daily ?Nitroglycerin 0.4 mg SL prn ?-Medications previously tried: none  ?-Recommended to continue current medication ? ?Diabetes (A1c goal <7%) ?-Not ideally controlled - A1c 7.2% (06/2021); pt recently ran out of [place], though he was rationing it with 1/2 dose for several weeks prior; his income went up last year so he thought he would not qualify for PAP this year, but he reports his household taxable income was < $60,000 so he should still qualify ?-he also had "backed off on" using insulin when BG was well controlled on Ozempic. He only recently started using Insulin again. He also is recovering from COVID/URI which can elevate BG as well ?-Random BG: 200s per pt report ?-Current medications: ?Glimepiride 4 mg BID - Appropriate, Query Effective, ?Novolin N - 20 units BID -Appropriate, Query Effective, ?Metformin 500 mg BID -Appropriate, Query Effective, ?Ozempic 1 mg weekly -Appropriate, Effective, Safe, Not Accessible ?-Medications previously tried: none reported ?-Pursue PAP for Ozempic. Printed forms and placed in front office for patient to come sign and provide proof of income ? ?Health Maintenance ?-Vaccine gaps: Shingrix, Covid booster ?-Chronic pain - cervical radiculitis, sciatica, osteoarthritis. Injections from other did not help, going to pain mgmt. ? ?Patient Goals/Self-Care Activities ?Patient will:  ?- take medications as prescribed as evidenced by patient report and record review ?focus on medication adherence by routine ?check glucose daily, document, and provide at future appointments ?collaborate with provider on medication access solutions (Ozempic) ? ?  ?  ? ?Patient verbalizes understanding of instructions and care plan provided today and agrees to view in [facility]. Active MyChart status confirmed with patient.   ?Telephone follow up appointment with pharmacy team member scheduled for: 3 months ? ?Charlene Brooke, PharmD, BCACP ?Clinical Pharmacist ?[facility] ?  [phone] ?  ?

## 2021-11-21 NOTE — Progress Notes (Signed)
? ?Chronic Care Management ?Pharmacy Note ? ?Name:  Tony Manning   MRN :  [id number] DOB:  1960/09/09 ? ?Summary: CCM F/U visit ?-Pt recently ran out of Ozempic, he was taking 1/2 dose of 1 mg pen to make it last longer and took last dose last week. His income increased last year so he thought he would not qualify for PAP this year, but after discussion he should still qualify ?-Pt reports fasting BG has been low 200s lately. Discussed rationing Ozempic and recent URI may be contributing to high BG. He also has "backed off on insulin" since Ozempic was previously controlling BG very well ? ?Recommendations:  ?-Renew [employer] PAP for Ozempic 1 mg weekly - pt will come to office to sign ?-Advised pt to take Insulin as prescribed until able to renew Ozempic supply ? ?Plan:  ?-[facility] will call patient 1 month for adherence packs/check on PAP ?-Pharmacist follow up televisit scheduled for 3 months ?-Pt is due for PCP f/u (4 months recommended 06/2021). Pt unable to make appt today but advised to call office to schedule ? ? ? ?Subjective: ?Tony Manning is an 61 y.o. year old male who is a primary patient of [name], Elveria Rising, MD.  The CCM team was consulted for assistance with disease management and care coordination needs.   ? ?Engaged with patient by telephone for follow up visit in response to provider referral for pharmacy case management and/or care coordination services.  ? ?Consent to Services:  ?The patient was given information about Chronic Care Management services, agreed to services, and gave verbal consent prior to initiation of services.  Please see initial visit note for detailed documentation.  ? ?Patient Care Team: ?Tonia Ghent, MD as PCP - General (Family Medicine) ?Adrian Prows, MD as Consulting Physician (Cardiology) ?Jonh Mcqueary, Cleaster Corin, [facility] as Pharmacist (Pharmacist) ? ?Recent office visits: ?11/19/21 NP Eugenia Pancoast VV: covid-19. Rx'd Augmentin. ?11/14/20 NP Eugenia Pancoast VV: covid-19. Rx'd Lagevrio. ? ?09/05/2021 - [name], MD - Patient presented for Covid positive. Start: Lagevrio. Stop due to patient not taking: oxycodone ? ?06/09/2021 - [name], MD - Patient presented for [facility]. Labs: CMP, A1c, CBC, Lipid, PSA and Vit B. Foot exam completed. Ran out of Ozempic, will restart. A1c stable 7.2%, Kidney function declined, refer to renal clinic. Administered: cyanocobalamin ((VITAMIN B-12)) injection 1,000 mcg. No medication changes. Follow up 3 months.  ?  ?Recent consult visits:  ?10/02/21 Dr Vikki Ports (psychology): chronic pain  - stable for spinal cord stimulator trial ?  ?Hospital visits:  ?None in previous 6 months ? ?Objective: ? ?Lab Results  ?Component Value Date  ? CREATININE 1.73 (H) 06/09/2021  ? BUN 19 06/09/2021  ? GFR 42.15 (L) 06/09/2021  ? GFRNONAA 54 (L) 11/12/2020  ? GFRAA 42 (L) 05/05/2019  ? NA 142 06/09/2021  ? K 4.1 06/09/2021  ? CALCIUM 9.6 06/09/2021  ? CO2 24 06/09/2021  ? GLUCOSE 179 (H) 06/09/2021  ? ? ?Lab Results  ?Component Value Date/Time  ? HGBA1C 7.2 (H) 06/09/2021 09:23 AM  ? HGBA1C 7.5 (H) 01/27/2021 12:40 PM  ? GFR 42.15 (L) 06/09/2021 09:23 AM  ? GFR 44.41 (L) 01/27/2021 12:40 PM  ? MICROALBUR 1.5 08/15/2012 09:05 AM  ?  ?Last diabetic Eye exam:  ?Lab Results  ?Component Value Date/Time  ? HMDIABEYEEXA Retinopathy (A) 01/13/2021 12:00 AM  ?  ?Last diabetic Foot exam:  10/25/20 normal  ? ?Lab Results  ?Component Value Date  ?  CHOL 112 06/09/2021  ? HDL 40.20 06/09/2021  ? [place] 57 06/09/2021  ? TRIG 73.0 06/09/2021  ? CHOLHDL 3 06/09/2021  ? ? ?Hepatic Function Latest Ref Rng & Units 06/09/2021 01/27/2021 03/05/2020  ?Total Protein 6.0 - 8.3 g/dL 6.8 7.5 7.5  ?Albumin 3.5 - 5.2 g/dL 4.3 4.8 4.6  ?AST 0 - 37 U/L _0 ?ALT 0 - 53 U/L _1 ?Alk Phosphatase 39 - 117 U/L 90 78 67  ?Total Bilirubin 0.2 - 1.2 mg/dL 0.5 0.6 0.5  ?Bilirubin, Direct 0.0 - 0.3 mg/dL - 0.1 -  ? ? ?Lab Results  ?Component Value Date/Time  ?  TSH 1.34 01/27/2021 12:40 PM  ? TSH 2.22 03/21/2018 12:05 PM  ? ? ?CBC Latest Ref Rng & Units 06/09/2021 01/27/2021 11/12/2020  ?WBC 4.0 - 10.5 K/uL 7.2 7.9 6.8  ?Hemoglobin 13.0 - 17.0 g/dL 13.7 14.0 13.3  ?Hematocrit 39.0 - 52.0 % 40.9 40.6 37.9(L)  ?Platelets 150.0 - 400.0 K/uL 198.0 223.0 215  ? ? ?Lab Results  ?Component Value Date/Time  ? VD25OH 19.37 (L) 03/21/2018 12:05 PM  ? ? ?Clinical ASCVD: Yes  ?The ASCVD Risk score (Arnett DK, et al., 2019) failed to calculate for the following reasons: ?  The patient has a prior MI or stroke diagnosis   ? ?Depression screen [facility] 2/9 09/20/2020 04/21/2018 10/11/2017  ?Decreased Interest 0 0 0  ?Down, Depressed, Hopeless 0 0 0  ?PHQ - 2 Score 0 0 0  ?Altered sleeping 0 - -  ?Tired, decreased energy 0 - -  ?Change in appetite 0 - -  ?Feeling bad or failure about yourself  0 - -  ?Trouble concentrating 0 - -  ?Moving slowly or fidgety/restless 0 - -  ?Suicidal thoughts 0 - -  ?PHQ-9 Score 0 - -  ?Difficult doing work/chores Not difficult at all - -  ?Some recent data might be hidden  ?  ?Social History  ? ?Tobacco Use  ?Smoking Status Never  ?Smokeless Tobacco Former  ? Types: Chew  ? Quit date: 02/06/2016  ? ?BP Readings from Last 3 Encounters:  ?09/05/21 130/77  ?06/09/21 122/74  ?05/05/21 126/72  ? ?Pulse Readings from Last 3 Encounters:  ?06/09/21 65  ?05/05/21 65  ?01/27/21 62  ? ?Wt Readings from Last 3 Encounters:  ?11/19/21 194 lb (88 kg)  ?11/14/21 197 lb (89.4 kg)  ?09/05/21 191 lb (86.6 kg)  ? ?BMI Readings from Last 3 Encounters:  ?11/19/21 27.84 kg/m?  ?11/14/21 28.27 kg/m?  ?09/05/21 27.41 kg/m?  ? ? ?Assessment/Interventions: Review of patient past medical history, allergies, medications, health status, including review of consultants reports, laboratory and other test data, was performed as part of comprehensive evaluation and provision of chronic care management services.  ? ?SDOH:  (Social Determinants of Health) assessments and interventions performed: Yes ? ?SDOH  Interventions   ? ?Flowsheet Row Most Recent Value  ?SDOH Interventions   ?Food Insecurity Interventions Intervention Not Indicated  ?Financial Strain Interventions Other (Comment)  [employer] PAP]  ? ?  ? ? ? ?Orlovista ? ?Allergies  ?Allergen Reactions  ? Ambien [Zolpidem] Other (See Comments)  ?  Parasomnias, sleep walking  ? Lisinopril Cough  ? Tramadol Diarrhea  ? ? ?Medications Reviewed Today   ? ? Reviewed by Charlton Haws, [facility] (Pharmacist) on 11/21/21 at [place] List Status: <None>  ? ?Medication Order Taking? Sig Documenting Provider Last Dose Status Informant  ?amitriptyline (ELAVIL) 10  MG tablet [id number] Yes Take 1 tablet (10 mg total) by mouth at bedtime as needed for sleep. Tonia Ghent, MD Taking Active   ?amLODipine (NORVASC) 10 MG tablet [id number] Yes TAKE ONE TABLET BY MOUTH EVERY MORNING Adrian Prows, MD Taking Active   ?amoxicillin-clavulanate (AUGMENTIN) 875-125 MG tablet [id number] Yes Take 1 tablet by mouth 2 (two) times daily. Eugenia Pancoast, FNP Taking Active   ?aspirin 81 MG chewable tablet [id number] Yes Chew 81 mg by mouth at bedtime. [provider] Taking Active Self  ?atorvastatin (LIPITOR) 40 MG tablet [id number] Yes TAKE ONE TABLET BY MOUTH EVERY MORNING Tonia Ghent, MD Taking Active   ?Blood Glucose Monitoring Suppl (ONETOUCH VERIO) w/Device KIT [id number] Yes Use to check blood sugars TID. Dx: E11.9 Tonia Ghent, MD Taking Active   ?carvedilol (COREG) 6.25 MG tablet [id number] Yes TAKE ONE TABLET BY MOUTH TWICE DAILY Adrian Prows, MD Taking Active   ?cyanocobalamin (,VITAMIN B-12,) 1000 MCG/ML injection [id number] Yes Inject 1 mL (1,000 mcg total) into the muscle every 14 (fourteen) days. Tonia Ghent, MD Taking Active   ?cyclobenzaprine (FLEXERIL) 5 MG tablet [id number] Yes Take 5 mg by mouth 3 (three) times daily as needed for muscle spasms. [provider] Taking Active   ?diclofenac Sodium (VOLTAREN) 1 % GEL [id number] Yes Apply 2-4 g  topically 4 (four) times daily. Tonia Ghent, MD Taking Active   ?famotidine (PEPCID) 20 MG tablet [id number] Yes TAKE ONE TABLET BY MOUTH EVERYDAY AT BEDTIME Parrett, Fonnie Mu, NP Taking Active   ?gl

## 2021-12-05 DIAGNOSIS — I2510 Atherosclerotic heart disease of native coronary artery without angina pectoris: Secondary | ICD-10-CM | POA: Diagnosis not present

## 2021-12-05 DIAGNOSIS — N1832 Chronic kidney disease, stage 3b: Secondary | ICD-10-CM

## 2021-12-05 DIAGNOSIS — E785 Hyperlipidemia, unspecified: Secondary | ICD-10-CM

## 2021-12-05 DIAGNOSIS — I129 Hypertensive chronic kidney disease with stage 1 through stage 4 chronic kidney disease, or unspecified chronic kidney disease: Secondary | ICD-10-CM | POA: Diagnosis not present

## 2021-12-05 DIAGNOSIS — Z794 Long term (current) use of insulin: Secondary | ICD-10-CM | POA: Diagnosis not present

## 2021-12-05 DIAGNOSIS — E1159 Type 2 diabetes mellitus with other circulatory complications: Secondary | ICD-10-CM

## 2021-12-05 DIAGNOSIS — E1122 Type 2 diabetes mellitus with diabetic chronic kidney disease: Secondary | ICD-10-CM

## 2021-12-05 NOTE — Progress Notes (Signed)
Called to patient to remind him to come to the office to sign his patient assistance paperwork for Ozempic. No answer; left message.  ? ?Charlene Brooke, CPP notified ? ?Marijean Niemann, RMA ?Clinical Pharmacy Assistant ?[phone] ? ? ?

## 2021-12-08 ENCOUNTER — Ambulatory Visit: Payer: Medicare HMO | Admitting: Family Medicine

## 2021-12-18 ENCOUNTER — Other Ambulatory Visit: Payer: Self-pay | Admitting: Family Medicine

## 2021-12-18 ENCOUNTER — Other Ambulatory Visit: Payer: Self-pay | Admitting: Adult Health

## 2021-12-19 ENCOUNTER — Telehealth: Payer: Self-pay

## 2021-12-19 DIAGNOSIS — E1149 Type 2 diabetes mellitus with other diabetic neurological complication: Secondary | ICD-10-CM

## 2021-12-19 NOTE — Progress Notes (Addendum)
? ? ?Chronic Care Management ?Pharmacy Assistant  ? ?Name: Tony Manning  MRN: [id number] DOB: 1961/04/14 ? ?Reason for Encounter: CCM (Medication Adherence and Delivery Coordination) ?  ?Recent office visits:  ?None since last CCM contact ? ?Recent consult visits:  ?None since last CCM contact ? ?Hospital visits:  ?None in previous 6 months ? ?Medications: ?Outpatient Encounter Medications as of 12/19/2021  ?Medication Sig  ? amitriptyline (ELAVIL) 10 MG tablet Take 1 tablet (10 mg total) by mouth at bedtime as needed for sleep.  ? amLODipine (NORVASC) 10 MG tablet TAKE ONE TABLET BY MOUTH EVERY MORNING  ? amoxicillin-clavulanate (AUGMENTIN) 875-125 MG tablet Take 1 tablet by mouth 2 (two) times daily.  ? aspirin 81 MG chewable tablet Chew 81 mg by mouth at bedtime.  ? atorvastatin (LIPITOR) 40 MG tablet TAKE ONE TABLET BY MOUTH EVERY MORNING  ? Blood Glucose Monitoring Suppl (ONETOUCH VERIO) w/Device KIT Use to check blood sugars TID. Dx: E11.9  ? carvedilol (COREG) 6.25 MG tablet TAKE ONE TABLET BY MOUTH TWICE DAILY  ? cyanocobalamin (,VITAMIN B-12,) 1000 MCG/ML injection Inject 1 mL (1,000 mcg total) into the muscle every 14 (fourteen) days.  ? cyclobenzaprine (FLEXERIL) 5 MG tablet Take 5 mg by mouth 3 (three) times daily as needed for muscle spasms.  ? diclofenac Sodium (VOLTAREN) 1 % GEL Apply 2-4 g topically 4 (four) times daily.  ? famotidine (PEPCID) 20 MG tablet TAKE ONE TABLET BY MOUTH EVERYDAY AT BEDTIME  ? glimepiride (AMARYL) 4 MG tablet TAKE ONE TABLET BY MOUTH TWICE DAILY  ? glucose blood (ONETOUCH VERIO) test strip USE TO check blood glucose THREE TIMES DAILY AS DIRECTED  ? Insulin Pen Needle 32G X 4 MM MISC Use daily with insulin pen.  ? Insulin Syringe-Needle U-100 (INSULIN SYRINGE 1CC/31GX5/16") 31G X 5/16" 1 ML MISC Use with NPH insulin twice daily.  ? isosorbide mononitrate (IMDUR) 30 MG 24 hr tablet TAKE ONE TABLET BY MOUTH EVERYDAY AT BEDTIME  ? Lancets (ONETOUCH ULTRASOFT) lancets Use  to check blood sugars TID. Dx: E11.9  ? losartan (COZAAR) 100 MG tablet TAKE ONE TABLET BY MOUTH EVERYDAY AT BEDTIME  ? metFORMIN (GLUCOPHAGE) 500 MG tablet TAKE ONE TABLET BY MOUTH TWICE DAILY  ? nitroGLYCERIN (NITROSTAT) 0.4 MG SL tablet Place 1 tablet (0.4 mg total) under the tongue every 5 (five) minutes as needed for chest pain.  ? NOVOLIN N FLEXPEN 100 UNIT/ML FlexPen Inject 20 Units into the skin in the morning and at bedtime.  ? ondansetron (ZOFRAN) 4 MG tablet Take 4 mg by mouth every 8 (eight) hours as needed.  ? oxyCODONE-acetaminophen (PERCOCET/ROXICET) 5-325 MG tablet Take 1 tablet by mouth 4 (four) times daily.  ? pantoprazole (PROTONIX) 40 MG tablet TAKE ONE TABLET BY MOUTH EVERY MORNING  ? Semaglutide, 1 MG/DOSE, 4 MG/3ML SOPN Inject 1 mg as directed once a week. (Patient not taking: Reported on 11/21/2021)  ? tamsulosin (FLOMAX) 0.4 MG CAPS capsule TAKE ONE CAPSULE BY MOUTH EVERY MORNING and TAKE ONE CAPSULE BY MOUTH EVERYDAY AT BEDTIME  ? ?No facility-administered encounter medications on file as of 12/19/2021.  ? ?BP Readings from Last 3 Encounters:  ?09/05/21 130/77  ?06/09/21 122/74  ?05/05/21 126/72  ?  ?Lab Results  ?Component Value Date  ? HGBA1C 7.2 (H) 06/09/2021  ?  ?No OVs, Consults, or hospital visits since last care coordination call / Pharmacist visit. ?No medication changes indicated ? ?Last adherence delivery date: 10/31/2021     ? ?Patient is due  for next adherence delivery on: 12/31/2021 ? ?Spoke with patient on 12/22/2021 reviewed medications and coordinated delivery. ? ?This delivery to include: Adherence Packaging  30 Days  ?Packs: ? ?Amlodipine 10mg - 1 tablet daily (1 breakfast) ?Aspirin 81mg - 1 tablet daily (breakfast) ?Metformin 500mg  1 tablet twice daily (1 tablet  breakfast, 1 tablet evening meal) ?Carvedilol 6.25mg - 1 tablet twice daily (1 breakfast, 1 evening meal) ?Isosorbide 30mg - 1 tablet daily (1 bedtime) ?Tamsulosin 0.4mg  2 capsule daily (1 breakfast, 1 bedtime)    ?Losartan 100mg - 1 tablet daily- (1 bedtime) ?Atorvastatin 40mg - 1 tablet daily (1 breakfast) ?Glimepiride 4mg - 1 tablet twice daily (1 breakfast, 1 evening meal) ?Pantoprazole 40 mg- 1 tablet daily (1 breakfast) ?Famotidine 20mg - 1 tablet daily (1 bedtime) ?  ?Vial medications: ?Ozempic - 1 mg weekly - patient was denied due to income being too high through [employer]. Patient would like to get through insurance for $47 through [facility].  ? ?Patient declined:  ?Novolin N Flexpen 100 unit/mL (3 mL) - Inject 16 units twice daily (uses 3 days per week on average)  ?Onetouch Verio test strips - PRN - still has 100 test strips.  ?  ?Any concerns about your medications? No ? ?How often do you forget or accidentally miss a dose? Occasionally ? ?Do you use a pillbox? No ? ?Is patient in packaging Yes ? If yes ? What is the date on your next pill pack? 12/16/2021 Dinner ? Any concerns or issues with your packaging? No, patient stated if he doesn't feel well he just doesn't take his medications. He is six days behind on his medications due to not taking.  ? ?Refills requested from providers include: ?Pantoprazole 40 mg- 1 tablet daily (1 breakfast) ?Glimepiride 4mg - 1 tablet twice daily (1 breakfast, 1 evening meal) ?Famotidine 20mg - 1 tablet daily (1 bedtime) ? ?Confirmed delivery date of 01/07/2022, advised patient that pharmacy will contact them the morning of delivery.  ? ?Recent blood pressure readings are as follows: Patient did not have any; he will bring them to his PCP appointment tomorrow (12/22/2021) with Dr. [name]. ? ?Recent blood glucose readings are as follows: Patient did not have any; he will bring them to his PCP appointment tomorrow (12/22/2021) with Dr. [name]. ? ?Annual wellness visit in last year? No ?Most Recent BP reading: 130/77 on 09/05/2021 ? ?If Diabetic: ?Most recent A1C reading: 7.2 on 06/09/2021 ?Last eye exam / retinopathy screening: Up to date ?Last diabetic foot exam: Up to  date ? ?Charlene Brooke, CPP notified ? ?Marijean Niemann, RMA ?Clinical Pharmacy Assistant ?[phone] ? ? ? ?

## 2021-12-23 ENCOUNTER — Encounter: Payer: Self-pay | Admitting: Family Medicine

## 2021-12-23 ENCOUNTER — Ambulatory Visit (INDEPENDENT_AMBULATORY_CARE_PROVIDER_SITE_OTHER): Payer: Medicare HMO | Admitting: Family Medicine

## 2021-12-23 ENCOUNTER — Telehealth: Payer: Self-pay

## 2021-12-23 VITALS — BP 122/68 | HR 63 | Temp 97.2°F | Ht 70.0 in | Wt 206.0 lb

## 2021-12-23 DIAGNOSIS — E559 Vitamin D deficiency, unspecified: Secondary | ICD-10-CM

## 2021-12-23 DIAGNOSIS — E538 Deficiency of other specified B group vitamins: Secondary | ICD-10-CM

## 2021-12-23 DIAGNOSIS — Z794 Long term (current) use of insulin: Secondary | ICD-10-CM

## 2021-12-23 DIAGNOSIS — E1149 Type 2 diabetes mellitus with other diabetic neurological complication: Secondary | ICD-10-CM

## 2021-12-23 DIAGNOSIS — R55 Syncope and collapse: Secondary | ICD-10-CM | POA: Diagnosis not present

## 2021-12-23 DIAGNOSIS — M5412 Radiculopathy, cervical region: Secondary | ICD-10-CM | POA: Diagnosis not present

## 2021-12-23 LAB — BASIC METABOLIC PANEL
BUN: 16 mg/dL (ref 6–23)
CO2: 27 mEq/L (ref 19–32)
Calcium: 9.4 mg/dL (ref 8.4–10.5)
Chloride: 109 mEq/L (ref 96–112)
Creatinine, Ser: 1.28 mg/dL (ref 0.40–1.50)
GFR: 60.28 mL/min (ref >60.00)
Glucose, Bld: 144 mg/dL — ABNORMAL HIGH (ref 70–99)
Potassium: 4.3 mEq/L (ref 3.5–5.1)
Sodium: 142 mEq/L (ref 135–145)

## 2021-12-23 LAB — CBC WITH DIFFERENTIAL/PLATELET
Basophils Absolute: 0 10*3/uL (ref 0.0–0.1)
Basophils Relative: 0.5 % (ref 0.0–3.0)
Eosinophils Absolute: 0.2 10*3/uL (ref 0.0–0.7)
Eosinophils Relative: 3.5 % (ref 0.0–5.0)
HCT: 39.6 % (ref 39.0–52.0)
Hemoglobin: 13.4 g/dL (ref 13.0–17.0)
Lymphocytes Relative: 19.6 % (ref 12.0–46.0)
Lymphs Abs: 1.2 10*3/uL (ref 0.7–4.0)
MCHC: 33.9 g/dL (ref 30.0–36.0)
MCV: 90.2 fl (ref 78.0–100.0)
Monocytes Absolute: 0.6 10*3/uL (ref 0.1–1.0)
Monocytes Relative: 9.8 % (ref 3.0–12.0)
Neutro Abs: 4 10*3/uL (ref 1.4–7.7)
Neutrophils Relative %: 66.6 % (ref 43.0–77.0)
Platelets: 193 10*3/uL (ref 150.0–400.0)
RBC: 4.39 Mil/uL (ref 4.22–5.81)
RDW: 14.3 % (ref 11.5–15.5)
WBC: 5.9 10*3/uL (ref 4.0–10.5)

## 2021-12-23 LAB — VITAMIN D 25 HYDROXY (VIT D DEFICIENCY, FRACTURES): VITD: 23.11 ng/mL — ABNORMAL LOW (ref 30.00–100.00)

## 2021-12-23 LAB — HEMOGLOBIN A1C: Hgb A1c MFr Bld: 7.2 % — ABNORMAL HIGH (ref 4.6–6.5)

## 2021-12-23 LAB — VITAMIN B12: Vitamin B-12: 290 pg/mL (ref 211–911)

## 2021-12-23 MED ORDER — SEMAGLUTIDE (1 MG/DOSE) 4 MG/3ML ~~LOC~~ SOPN: 1.0000 mg | PEN_INJECTOR | SUBCUTANEOUS | 2 refills | Status: DC

## 2021-12-23 MED ORDER — PREGABALIN 25 MG PO CAPS: 25.0000 mg | ORAL_CAPSULE | Freq: Two times a day (BID) | ORAL | 1 refills | Status: DC

## 2021-12-23 NOTE — Telephone Encounter (Signed)
Refilled Ozempic to [facility] for price check, as patient was denied [employer] pt assistance. ?

## 2021-12-23 NOTE — Progress Notes (Signed)
Neck pain.  Using a soft neck brace.  It helps some.  Taking oxycodone daily. Had seen pain clinic but can't get in for appointment until the middle of next month.  He can get oxycodone refilled per pain clinic.  Pain with ROM.  Ice and heat help temporarily.  Pain radiates down the L side of the neck, toward the L shoulder.  More pain at night.  No pain radiating down the arm.  He can feel the muscle tightening in the L side of the neck.  He did PT prev.   ? ?H/o near syncope.  Working part time at [employer].  Didn't have LOC but fell backward at the [employer].  He recalls the event.  No SZ like activity at the time.  No h/o SZ.  He didn't have sensation of low sugar at the time.  He was able to get up and then went home- wife drove him home.  No sx in the meantime.   ? ?Most recent B12 shot was months ago.  D/w pt.  ? ?History of diabetes.  Due for f/u A1c, d/w pt.  Off ozempic in the meantime, awaiting approval.  He has checked with pharmacy staff here.  Taking 20-25 units qAM, novolin N.  Sugar this AM was 215.   ? ?Meds, vitals, and allergies reviewed.  ? ?ROS: Per HPI unless specifically indicated in ROS section  ? ?GEN: nad, alert and oriented ?HEENT: ncat ?NECK: supple w/o LA, L posterior neck ttp ?CV: rrr. ?PULM: ctab, no inc wob ?ABD: soft, +bs ?EXT: Trace BLE edema.   ?SKIN: no acute rash ?

## 2021-12-23 NOTE — Patient Instructions (Addendum)
Go to the lab on the way out.   If you have mychart we'll likely use that to update you.    ?Call the pain clinic about oxycodone refills.   ?Take care.  Glad to see you. ?Try lyrica '25mg'$  a day for 5 days, then twice a day if needed and tolerated.   ?

## 2021-12-23 NOTE — Telephone Encounter (Signed)
Received fax from [employer]. Pt was denied Ozempic assistance due to income too high. ?

## 2021-12-23 NOTE — Progress Notes (Addendum)
? ? ?  Chronic Care Management ?Pharmacy Assistant  ? ?Name: ARSENIO SCHNORR  MRN: [id number] DOB: Aug 16, 1961 ? ?Reason for Encounter: CCM (Ozempic 2023 Denial) ?  ?Patient has been denied for Ozempic patient assistance for 2023 with Novo Nordisk due to income being too high. Patient has been made aware; he would like to know the cost of paying for it through insurance. I have made patient aware that for a 28 day supply of Ozempic it will cost him $47. Patient would like the medication through [facility] with his May delivery of medications. [facility] has been notified.  ? ?Patient would also like to know what it would cost for him to get a continuous blood glucose monitor. He states he is going to stop checking his blood sugar if he has to keep pricking his fingers as they are hurting him so much.  ? ?Charlene Brooke, CPP notified ? ?Marijean Niemann, RMA ?Clinical Pharmacy Assistant ?[phone] ? ? ?

## 2021-12-23 NOTE — Addendum Note (Signed)
Addended by: Charlton Haws on: 12/23/2021 01:29 PM ? ? Modules accepted: Orders ? ?

## 2021-12-25 ENCOUNTER — Telehealth: Payer: Self-pay | Admitting: Pharmacist

## 2021-12-25 ENCOUNTER — Other Ambulatory Visit: Payer: Self-pay | Admitting: Family Medicine

## 2021-12-25 DIAGNOSIS — R55 Syncope and collapse: Secondary | ICD-10-CM | POA: Insufficient documentation

## 2021-12-25 DIAGNOSIS — Z794 Long term (current) use of insulin: Secondary | ICD-10-CM

## 2021-12-25 MED ORDER — FREESTYLE LIBRE 2 SENSOR MISC: 5 refills | Status: DC

## 2021-12-25 MED ORDER — FREESTYLE LIBRE 2 READER DEVI: 0 refills | Status: DC

## 2021-12-25 MED ORDER — VITAMIN D3 50 MCG (2000 UT) PO CAPS: 2000.0000 [IU] | ORAL_CAPSULE | Freq: Every day | ORAL | Status: DC

## 2021-12-25 NOTE — Assessment & Plan Note (Signed)
Discussed with patient about options.  He can call the pain clinic about follow-up/med refill.  Reasonable to try Try lyrica '25mg'$  a day for 5 days, then twice a day if needed and tolerated.  Rationale discussed with patient routine cautions given to patient. ?

## 2021-12-25 NOTE — Assessment & Plan Note (Addendum)
Unclear if the episode at work was related to relative hypoglycemia.  He did not have the sensation of low sugar at the time.  No history of seizure activity.  He did not have complete loss of consciousness.  No symptoms in the interval.  Recheck labs today.  Would continue glimepiride metformin insulin in the meantime. ?Off ozempic in the meantime, awaiting approval.   ?

## 2021-12-25 NOTE — Assessment & Plan Note (Signed)
Recheck B12 today.  See notes on labs. ?

## 2021-12-25 NOTE — Telephone Encounter (Signed)
Patient requested CGM due to painful finger sticks, he says he will stop checking BG due to this pain. ? ?Based on 2 daily insulin injections daily, he should qualify for CGM. ? ?Submitted order for [employer] 2 to [facility] through [facility]. ?

## 2021-12-25 NOTE — Assessment & Plan Note (Signed)
See above.  Unclear source.  No chest pain.  No events in the meantime.  Recheck routine labs today. ?

## 2021-12-26 NOTE — Progress Notes (Signed)
Patient has been informed that [facility] will be calling him in regards to his [employer].  ? ?Charlene Brooke, CPP notified ? ?Marijean Niemann, RMA ?Clinical Pharmacy Assistant ?[phone] ? ?

## 2021-12-30 ENCOUNTER — Telehealth: Payer: Self-pay

## 2021-12-30 DIAGNOSIS — M542 Cervicalgia: Secondary | ICD-10-CM | POA: Diagnosis not present

## 2021-12-30 MED ORDER — CYCLOBENZAPRINE HCL 5 MG PO TABS: 5.0000 mg | ORAL_TABLET | Freq: Three times a day (TID) | ORAL | 1 refills | Status: DC | PRN

## 2021-12-30 NOTE — Telephone Encounter (Signed)
lmtcb

## 2021-12-30 NOTE — Telephone Encounter (Signed)
I sent the rx for cyclobenzaprine but would hold amitriptyline while using cyclobenzaprine.  I temporarily took amitriptyline off his med list.  Would cancel the appointment if better in the meantime.  Otherwise would be okay to keep the appointment.  Thanks.  ?

## 2021-12-30 NOTE — Telephone Encounter (Signed)
Patient called stating he saw Dr [name] on 12/23/21 for neck pain issues. Patient states he is still having issues with muscle tightening on his neck as noted on last note, patient would like to see if he can have muscle relaxer to help with this, he use to take Cyclobenzaprine in the past but the ones he has are out dated. Patient states he saw [facility] office this morning and the provider just kept talking about PT sessions, which are costly for the patient $60 each time and discussed trying muscle relaxer but did not prescribe it, patient is not sure if they forgot or what. ? ?He also made an appointment for 01/09/22-first available with Dr [name] to discuss this, let patient know if he needs to cancel or keep that appointment  ?

## 2021-12-30 NOTE — Telephone Encounter (Signed)
Patient called back and advised patient about rx and to hold off taking amitriptyline while on cyclobenzaprine. Patient verbalized understanding. Patient wants to keep appt for now but if decides he doesn't need it will call back to cancel it. ?

## 2022-01-01 ENCOUNTER — Telehealth: Payer: Self-pay

## 2022-01-01 DIAGNOSIS — E1149 Type 2 diabetes mellitus with other diabetic neurological complication: Secondary | ICD-10-CM | POA: Diagnosis not present

## 2022-01-01 DIAGNOSIS — Z794 Long term (current) use of insulin: Secondary | ICD-10-CM | POA: Diagnosis not present

## 2022-01-01 NOTE — Telephone Encounter (Signed)
Patient can be added to the schedule some time in the next week or so, or he may follow up with PCP as previous echo looked good.

## 2022-01-01 NOTE — Telephone Encounter (Signed)
Pt called and stated that both of his legs are swollen and he is SOB. Pts BP is 136/65. He stated that this just started a few days ago. He is unsure of what to do. Please advise.  ?

## 2022-01-02 ENCOUNTER — Telehealth: Payer: Self-pay | Admitting: Cardiology

## 2022-01-02 ENCOUNTER — Telehealth: Payer: Self-pay

## 2022-01-02 DIAGNOSIS — Z79891 Long term (current) use of opiate analgesic: Secondary | ICD-10-CM | POA: Diagnosis not present

## 2022-01-02 DIAGNOSIS — M25519 Pain in unspecified shoulder: Secondary | ICD-10-CM | POA: Diagnosis not present

## 2022-01-02 DIAGNOSIS — M5412 Radiculopathy, cervical region: Secondary | ICD-10-CM | POA: Diagnosis not present

## 2022-01-02 NOTE — Telephone Encounter (Signed)
Please call and schedule pt within the next week or so.

## 2022-01-02 NOTE — Progress Notes (Signed)
? ? ?  Chronic Care Management ?Pharmacy Assistant  ? ?Name: Tony Manning  MRN: [id number] DOB: 1961/07/29 ? ?Reason for Encounter: CCM (Leg swelling and Shortness of Breath) ?  ?Spoke with patient this morning. He is concerned because both legs are swollen from about six inches above his calves to his feet (both legs). States the swelling is worse this morning. He states the swelling seems to be going up his legs. Symptoms include red spots, itching on his ankles and shortness of breath. Shortness of breath is described as hard to breath when patient is doing physical activity where it was not hard before. Patient reported his blood pressure yesterday (01/01/22) as 136/65 and today (01/02/22 at [facility]) 110/66. Patient did take 1 tab of Benadryl last night (01/01/22) and stated it did not help enough with the itching to notice a difference. Patient stated the itching is bad enough that he is itching while sleeping. Patient noticed symptoms began when he started taking Lyrica on 12/24/2021. Patient has been taking one 25 mg capsule for the past ten days. Patient also stated he has not had any changes in his pain since starting the Lyrica. His pain has remained a 10/10 since starting the medication. The pain has not eased up. Patient did see [facility] today (01/01/22); has to be approved for neck injection through insurance. Patient was advised that insurance will typically require 6 weeks of physical therapy before approving the injection. Patient stated he can not go through six weeks of physical therapy with the pain he is enduring. If the injection is approved it will be two weeks after approval before he can receive the injection.  ? ?I advised patient that his Famotidine can not be filled until he makes an appointment with Rexene Edison, NP. Patient stated that he does not feel the need to see her anymore and is wondering if Dr. [name] would be able to prescribe  him the medication . Patient saw Dr. [name] on 12/23/21 and has an upcoming appointment on 01/09/22. [facility] puts this medication is patient's pill packs and will delivery his medications on 01/07/22. I advised patient I would consult Charlene Brooke and let him know.  ? ?Patient would also like an in office appointment with Charlene Brooke for instruction on how to use his [employer] [place] 2. Patient gave me the tracking number for the meter and patient should get delivery on Saturday. I advised patient I would set up his appointment when I called him to follow up on the other matters we discussed.  ? ?Charlene Brooke, CPP notified ? ?Marijean Niemann, RMA ?Clinical Pharmacy Assistant ?[phone] ? ? ? ? ? ?

## 2022-01-02 NOTE — Telephone Encounter (Signed)
Spoke with patient regarding leg swelling/SOB. Patient has already contacted cardiology and they have recommend an office visit within the next week. Relayed this information to patient, he reports he has talked to cardiology this morning and they are holding an appt slot for him Mon or Wed, he will call them back and confirm the slot now. ? ?He also wanted to set up appointment with me to go over [facility] which will be delivered to him on Saturday. He is coming in to see PCP 5/5, offered 12pm appt with me afterwards and he accepted. ? ?Finally, pt reports swelling/SOB started when he started taking Lyrica, He has been taking 25 mg daily for about 10 days. Discussed Lyrica is known to cause leg swelling/edema, not necessarily SOB. He has not noticed improvement in pain since starting Lyrica. Advised pt to hold Lyrica over the weekend until he sees cardiology. ?

## 2022-01-02 NOTE — Telephone Encounter (Signed)
Left voicemail for patient to call back and schedule f/u for leg swelling and SOB. ?

## 2022-01-02 NOTE — Telephone Encounter (Signed)
I agree with the plan.  Thanks.  And he can likely get famotidine cheaper OTC.  Will d/w pt at [facility].  ?

## 2022-01-02 NOTE — Telephone Encounter (Signed)
Pt coming in 01/05/2022 at 9:30 am.

## 2022-01-05 ENCOUNTER — Ambulatory Visit: Payer: Medicare HMO | Admitting: Student

## 2022-01-05 ENCOUNTER — Encounter: Payer: Self-pay | Admitting: Student

## 2022-01-05 VITALS — BP 108/65 | HR 60 | Temp 98.0°F | Resp 17 | Ht 70.0 in | Wt 211.8 lb

## 2022-01-05 DIAGNOSIS — R600 Localized edema: Secondary | ICD-10-CM

## 2022-01-05 DIAGNOSIS — R0602 Shortness of breath: Secondary | ICD-10-CM | POA: Diagnosis not present

## 2022-01-05 DIAGNOSIS — I25118 Atherosclerotic heart disease of native coronary artery with other forms of angina pectoris: Secondary | ICD-10-CM | POA: Diagnosis not present

## 2022-01-05 MED ORDER — POTASSIUM CHLORIDE ER 10 MEQ PO TBCR: 10.0000 meq | EXTENDED_RELEASE_TABLET | Freq: Every day | ORAL | 3 refills | Status: DC | PRN

## 2022-01-05 MED ORDER — AMLODIPINE BESYLATE 5 MG PO TABS: 5.0000 mg | ORAL_TABLET | Freq: Every morning | ORAL | 3 refills | Status: DC

## 2022-01-05 MED ORDER — FUROSEMIDE 20 MG PO TABS: 20.0000 mg | ORAL_TABLET | Freq: Every day | ORAL | 3 refills | Status: DC | PRN

## 2022-01-05 NOTE — Progress Notes (Signed)
? ?Primary Physician/Referring:  Tonia Ghent, MD ? ?Patient ID: Tony Manning, male    DOB: 1960-09-19, 61 y.o.   MRN: [id number] ? ?Chief Complaint  ?Patient presents with  ? Near Syncope  ? Shortness of Breath  ? Leg Swelling  ? ? ?HPI: [name]  is a 61 y.o. male with coronary artery disease status post PCI to RCA and LAD, long-standing degenerative disc disease and involving neck and shoulders, sciatica, hyperlipidemia, OSA on CPAP, uncontrolled DM, hypertension.  ? ?Patient was last seen by Dr. Einar Gip 05/05/2021 at which time he was stable from a cardiovascular standpoint and advised to follow-up in 1 year.  However patient presents today in his request for urgent visit with complaints of leg swelling and shortness of breath over the last 2 weeks. He does admit to some dietary indsiscreation eluding difficulty reducing sodium intake.  He notes that shortness of breath occasionally occurs at rest, but is primarily with exertion.  Swelling is worst at the end of the days and mildly improved with compression socks.  His weight has trended up about 5 pounds since last office visit.  Denies orthopnea, PND. ? ?Patient also notes an episode a couple of months ago when he was standing for long period of time at work and became dizzy and fell.  Denies loss of consciousness. He has had no recurrence of this.  ? ?Past Medical History:  ?Diagnosis Date  ? Anginal pain ([place])   ? Basal cell carcinoma of face   ? Chest pain, unspecified   ? Chronic kidney disease   ? kidney stones  ? Coronary atherosclerosis of native coronary artery   ? s/p stent x5  ? Depression   ? Diabetes mellitus without mention of complication   ? Diverticulosis   ? Dyspnea   ? Dysrhythmia   ? Erectile dysfunction   ? Essential hypertension, benign   ? GERD (gastroesophageal reflux disease)   ? Heart murmur   ? History of kidney stones   ? History of nephrolithiasis   ? Hyperlipidemia   ? Migraine   ? Myocardial infarction [facility])   ?  2016  ? Neuromuscular disorder ([place])   ? Osteoarthritis   ? Pneumonia   ? Postsurgical percutaneous transluminal coronary angioplasty status   ? s/p stent x4  ? Sarcoidosis   ? Sleep apnea   ? uses CPAP  ? Unspecified sleep apnea   ? uses C-pap  ? ?Past Surgical History:  ?Procedure Laterality Date  ? ANTERIOR CERVICAL DECOMP/DISCECTOMY FUSION N/A 04/07/2017  ? Procedure: ANTERIOR CERVICAL DECOMPRESSION FUSION, CERVICAL 4-5 WITH INSTRUMENTATION AND ALLOGRAFT;  Surgeon: Phylliss Bob, MD;  Location: [place];  Service: Orthopedics;  Laterality: N/A;  ANTERIOR CERVICAL DECOMPRESSION FUSION, CERVICAL 4-5 WITH INSTRUMENTATION AND ALLOGRAFT; REQUEST 2.5 HOURS AND FLIP ROOM  ? APPENDECTOMY  2005  ? CARPAL TUNNEL RELEASE    ? CERVICAL FUSION  2009  ? COLONOSCOPY    ? CORONARY ANGIOPLASTY WITH STENT PLACEMENT    ? 2004  ? CORONARY STENT INTERVENTION N/A 10/16/2016  ? Procedure: Coronary Stent Intervention;  Surgeon: Adrian Prows, MD;  Location: [place] [facility];  Service: Cardiovascular;  Laterality: N/A;  ? CYSTOSCOPY WITH RETROGRADE PYELOGRAM, URETEROSCOPY AND STENT PLACEMENT Right 05/12/2019  ? Procedure: CYSTOSCOPY WITH RIGHT RETROGRADE PYELOGRAM, URETEROSCOPY HOLMIUM LASER AND STENT PLACEMENT;  Surgeon: Lucas Mallow, MD;  Location: [facility];  Service: Urology;  Laterality: Right;  ? ELBOW SURGERY    ? bilateral  ?  HAND SURGERY Left   ? KNEE SURGERY Bilateral   ? Bilateral   ? LEFT HEART CATH AND CORONARY ANGIOGRAPHY N/A 10/16/2016  ? Procedure: Left Heart Cath and Coronary Angiography;  Surgeon: Adrian Prows, MD;  Location: [place] [facility];  Service: Cardiovascular;  Laterality: N/A;  ? LEFT HEART CATHETERIZATION WITH CORONARY ANGIOGRAM N/A 06/14/2014  ? Procedure: LEFT HEART CATHETERIZATION WITH CORONARY ANGIOGRAM;  Surgeon: Laverda Page, MD;  Location: [facility];  Service: Cardiovascular;  Laterality: N/A;  ? LUNG BIOPSY    ? sarcoid  ? ROTATOR CUFF REPAIR    ? left  ? SHOULDER ARTHROSCOPY WITH SUBACROMIAL  DECOMPRESSION Left 06/16/2018  ? Procedure: LEFT SHOULDER ARTHROSCOPY WITH ROTATOR CUFF DEBRIDEMENT AND  SUBACROMIAL DECOMPRESSION;  Surgeon: Tania Ade, MD;  Location: [place];  Service: Orthopedics;  Laterality: Left;  ? ?Social History  ? ?Tobacco Use  ? Smoking status: Never  ? Smokeless tobacco: Former  ?  Types: Chew  ?  Quit date: 02/06/2016  ?Substance Use Topics  ? Alcohol use: No  ?  Alcohol/week: 0.0 standard drinks  ? ?Marital Status: Married  ? ?ROS  ?Review of Systems  ?Cardiovascular:  Positive for dyspnea on exertion and leg swelling. Negative for chest pain.  ?Musculoskeletal:  Positive for arthritis and back pain.  ?Gastrointestinal:  Negative for melena.  ? ?Objective  ?Blood pressure 108/65, pulse 60, temperature 98 ?F (36.7 ?C), temperature source Temporal, resp. rate 17, height '5\' 10"'$  (1.778 m), weight 211 lb 12.8 oz (96.1 kg), SpO2 98 %. Body mass index is 30.39 kg/m?.  ? ?  01/05/2022  ?  9:22 AM 12/23/2021  ? 11:01 AM 11/19/2021  ?  8:13 AM  ?Vitals with BMI  ?Height '5\' 10"'$  '5\' 10"'$  '5\' 10"'$   ?Weight 211 lbs 13 oz 206 lbs 194 lbs  ?BMI 30.39 29.56 27.84  ?Systolic 144 315   ?Diastolic 65 68   ?Pulse 60 63   ?  ?Orthostatic VS for the past 72 hrs (Last 3 readings): ? Orthostatic BP Patient Position BP Location Cuff Size Orthostatic Pulse  ?01/05/22 0931 116/56 Standing Left Arm Normal 61  ?01/05/22 0930 127/63 Sitting Left Arm Normal 64  ?01/05/22 0929 131/66 Supine Left Arm Normal 60  ? ? ?Physical Exam ?Vitals reviewed.  ?Constitutional:   ?   General: He is not in acute distress. ?   Appearance: He is well-developed.  ?Neck:  ?   Thyroid: No thyromegaly.  ?   Vascular: No carotid bruit or JVD.  ?Cardiovascular:  ?   Rate and Rhythm: Normal rate and regular rhythm.  ?   Pulses: Normal pulses and intact distal pulses.  ?   Heart sounds: Normal heart sounds, S1 normal and S2 normal. No murmur heard. ?  No gallop.  ?Pulmonary:  ?   Effort: Pulmonary effort is normal. No respiratory distress.  ?    Breath sounds: Normal breath sounds. No wheezing, rhonchi or rales.  ?Musculoskeletal:  ?   Right lower leg: Edema (1+ to ankle) present.  ?   Left lower leg: Edema (2+ to ankle) present.  ? ?Laboratory examination:  ? ? ?  Latest Ref Rng & Units 12/23/2021  ? 11:47 AM 06/09/2021  ?  9:23 AM 01/27/2021  ? 12:40 PM  ?CMP  ?Glucose 70 - 99 mg/dL 144   179   157    ?BUN 6 - 23 mg/dL '16   19   24    '$ ?Creatinine 0.40 -  1.50 mg/dL 1.28   1.73   1.66    ?Sodium 135 - 145 mEq/L 142   142   139    ?Potassium 3.5 - 5.1 mEq/L 4.3   4.1   4.8    ?Chloride 96 - 112 mEq/L 109   108   106    ?CO2 19 - 32 mEq/L '27   24   25    '$ ?Calcium 8.4 - 10.5 mg/dL 9.4   9.6   10.1    ?Total Protein 6.0 - 8.3 g/dL  6.8   7.5    ?Total Bilirubin 0.2 - 1.2 mg/dL  0.5   0.6    ?Alkaline Phos 39 - 117 U/L  90   78    ?AST 0 - 37 U/L  18   20    ?ALT 0 - 53 U/L  23   21    ? ? ?  Latest Ref Rng & Units 12/23/2021  ? 11:47 AM 06/09/2021  ?  9:23 AM 01/27/2021  ? 12:40 PM  ?CBC  ?WBC 4.0 - 10.5 K/uL 5.9   7.2   7.9    ?Hemoglobin 13.0 - 17.0 g/dL 13.4   13.7   14.0    ?Hematocrit 39.0 - 52.0 % 39.6   40.9   40.6    ?Platelets 150.0 - 400.0 K/uL 193.0   198.0   223.0    ? ?Lipid Panel  ?   ?Component Value Date/Time  ? CHOL 112 06/09/2021 0923  ? TRIG 73.0 06/09/2021 0923  ? HDL 40.20 06/09/2021 0923  ? CHOLHDL 3 06/09/2021 0923  ? VLDL 14.6 06/09/2021 0923  ? [place] 57 06/09/2021 0923  ?  ?Lipid Panel ?Recent Labs  ?  06/09/21 ?4818  ?CHOL 112  ?TRIG 73.0  ?[place] 57  ?VLDL 14.6  ?HDL 40.20  ?CHOLHDL 3  ? ?  ?HEMOGLOBIN A1C ?Lab Results  ?Component Value Date  ? HGBA1C 7.2 (H) 12/23/2021  ? MPG 180.03 05/11/2019  ? ?TSH ?Recent Labs  ?  01/27/21 ?1240  ?TSH 1.34  ?  ?Allergies  ? ?Allergies  ?Allergen Reactions  ? Ambien [Zolpidem] Other (See Comments)  ?  Parasomnias, sleep walking  ? Lisinopril Cough  ? Gabapentin Other (See Comments)  ?  REACTION: lack of effect for pain  ? Tramadol Diarrhea  ?  ?Medications Prior to Visit:  ? ?Outpatient Medications  Prior to Visit  ?Medication Sig Dispense Refill  ? aspirin 81 MG chewable tablet Chew 81 mg by mouth at bedtime.    ? atorvastatin (LIPITOR) 40 MG tablet TAKE ONE TABLET BY MOUTH EVERY MORNING 90 tablet 0

## 2022-01-06 ENCOUNTER — Telehealth: Payer: Self-pay

## 2022-01-06 NOTE — Progress Notes (Signed)
? ? ?Chronic Care Management ?Pharmacy Assistant  ? ?Name: Tony Manning  MRN: [id number] DOB: 1961/06/14 ? ?Reason for Encounter: CCM Counsellor) ?  ?Medications: ?Outpatient Encounter Medications as of 01/06/2022  ?Medication Sig  ? amLODipine (NORVASC) 5 MG tablet Take 1 tablet (5 mg total) by mouth every morning.  ? aspirin 81 MG chewable tablet Chew 81 mg by mouth at bedtime.  ? atorvastatin (LIPITOR) 40 MG tablet TAKE ONE TABLET BY MOUTH EVERY MORNING  ? Blood Glucose Monitoring Suppl (ONETOUCH VERIO) w/Device KIT Use to check blood sugars TID. Dx: E11.9  ? carvedilol (COREG) 6.25 MG tablet TAKE ONE TABLET BY MOUTH TWICE DAILY  ? Cholecalciferol (VITAMIN D3) 50 MCG (2000 UT) capsule Take 1 capsule (2,000 Units total) by mouth daily.  ? Continuous Blood Gluc Receiver (FREESTYLE LIBRE 2 READER) DEVI Use with sensors to monitor sugar continuously  ? Continuous Blood Gluc Sensor (FREESTYLE LIBRE 2 SENSOR) MISC Apply sensor every 14 days to monitor sugar continously  ? cyanocobalamin (,VITAMIN B-12,) 1000 MCG/ML injection Inject 1 mL (1,000 mcg total) into the muscle every 14 (fourteen) days.  ? cyclobenzaprine (FLEXERIL) 5 MG tablet Take 1 tablet (5 mg total) by mouth 3 (three) times daily as needed for muscle spasms (sedation caution).  ? diclofenac Sodium (VOLTAREN) 1 % GEL Apply 2-4 g topically 4 (four) times daily.  ? famotidine (PEPCID) 20 MG tablet TAKE ONE TABLET BY MOUTH EVERYDAY AT BEDTIME  ? furosemide (LASIX) 20 MG tablet Take 1 tablet (20 mg total) by mouth daily as needed for fluid or edema.  ? glimepiride (AMARYL) 4 MG tablet TAKE ONE TABLET BY MOUTH TWICE DAILY  ? glucose blood (ONETOUCH VERIO) test strip USE TO check blood glucose THREE TIMES DAILY AS DIRECTED  ? Insulin Pen Needle 32G X 4 MM MISC Use daily with insulin pen.  ? Insulin Syringe-Needle U-100 (INSULIN SYRINGE 1CC/31GX5/16") 31G X 5/16" 1 ML MISC Use with NPH insulin twice daily.  ? isosorbide mononitrate (IMDUR) 30 MG 24  hr tablet TAKE ONE TABLET BY MOUTH EVERYDAY AT BEDTIME  ? Lancets (ONETOUCH ULTRASOFT) lancets Use to check blood sugars TID. Dx: E11.9  ? losartan (COZAAR) 100 MG tablet TAKE ONE TABLET BY MOUTH EVERYDAY AT BEDTIME  ? metFORMIN (GLUCOPHAGE) 500 MG tablet TAKE ONE TABLET BY MOUTH TWICE DAILY  ? nitroGLYCERIN (NITROSTAT) 0.4 MG SL tablet Place 1 tablet (0.4 mg total) under the tongue every 5 (five) minutes as needed for chest pain.  ? NOVOLIN N FLEXPEN 100 UNIT/ML FlexPen Inject 20 Units into the skin in the morning and at bedtime.  ? ondansetron (ZOFRAN) 4 MG tablet Take 4 mg by mouth every 8 (eight) hours as needed.  ? oxyCODONE-acetaminophen (PERCOCET/ROXICET) 5-325 MG tablet Take 1 tablet by mouth 4 (four) times daily.  ? pantoprazole (PROTONIX) 40 MG tablet TAKE ONE TABLET BY MOUTH EVERY MORNING  ? potassium chloride (KLOR-CON) 10 MEQ tablet Take 1 tablet (10 mEq total) by mouth daily as needed (with Lasix).  ? pregabalin (LYRICA) 25 MG capsule Take 1 capsule (25 mg total) by mouth 2 (two) times daily.  ? Semaglutide, 1 MG/DOSE, 4 MG/3ML SOPN Inject 1 mg as directed once a week.  ? tamsulosin (FLOMAX) 0.4 MG CAPS capsule TAKE ONE CAPSULE BY MOUTH EVERY MORNING and TAKE ONE CAPSULE BY MOUTH EVERYDAY AT BEDTIME  ? ?No facility-administered encounter medications on file as of 01/06/2022.  ? ?Tony Manning was contacted to remind of upcoming office visit with Mendel Ryder  Foltanski on 01/09/2022 at 12:00. Patient was reminded to have any blood glucose and blood pressure readings available for review at appointment.  ? ?Patient confirmed appointment. ? ?Patient has an appointment for [facility] 2 training with Charlene Brooke after his office appointment with Dr. [name] at 11:30.  ? ?Charlene Brooke, CPP notified ? ?Marijean Niemann, RMA ?Clinical Pharmacy Assistant ?[phone] ? ? ?

## 2022-01-07 ENCOUNTER — Ambulatory Visit: Payer: Medicare HMO

## 2022-01-07 DIAGNOSIS — R600 Localized edema: Secondary | ICD-10-CM | POA: Diagnosis not present

## 2022-01-07 DIAGNOSIS — R0602 Shortness of breath: Secondary | ICD-10-CM

## 2022-01-09 ENCOUNTER — Ambulatory Visit (INDEPENDENT_AMBULATORY_CARE_PROVIDER_SITE_OTHER): Payer: Medicare HMO | Admitting: Family Medicine

## 2022-01-09 ENCOUNTER — Ambulatory Visit (INDEPENDENT_AMBULATORY_CARE_PROVIDER_SITE_OTHER): Payer: Medicare HMO | Admitting: Pharmacist

## 2022-01-09 ENCOUNTER — Encounter: Payer: Self-pay | Admitting: Family Medicine

## 2022-01-09 VITALS — BP 120/60 | HR 60 | Temp 98.2°F | Wt 208.0 lb

## 2022-01-09 DIAGNOSIS — R0602 Shortness of breath: Secondary | ICD-10-CM | POA: Diagnosis not present

## 2022-01-09 DIAGNOSIS — M542 Cervicalgia: Secondary | ICD-10-CM | POA: Diagnosis not present

## 2022-01-09 DIAGNOSIS — E1149 Type 2 diabetes mellitus with other diabetic neurological complication: Secondary | ICD-10-CM

## 2022-01-09 DIAGNOSIS — G8929 Other chronic pain: Secondary | ICD-10-CM

## 2022-01-09 DIAGNOSIS — N189 Chronic kidney disease, unspecified: Secondary | ICD-10-CM

## 2022-01-09 LAB — BASIC METABOLIC PANEL
BUN: 19 mg/dL (ref 6–23)
CO2: 29 mEq/L (ref 19–32)
Calcium: 9.6 mg/dL (ref 8.4–10.5)
Chloride: 105 mEq/L (ref 96–112)
Creatinine, Ser: 1.56 mg/dL — ABNORMAL HIGH (ref 0.40–1.50)
GFR: 47.53 mL/min — ABNORMAL LOW (ref >60.00)
Glucose, Bld: 156 mg/dL — ABNORMAL HIGH (ref 70–99)
Potassium: 4.9 mEq/L (ref 3.5–5.1)
Sodium: 140 mEq/L (ref 135–145)

## 2022-01-09 LAB — BRAIN NATRIURETIC PEPTIDE: Pro B Natriuretic peptide (BNP): 8 pg/mL (ref 0.0–100.0)

## 2022-01-09 MED ORDER — PREGABALIN 25 MG PO CAPS: ORAL_CAPSULE | ORAL | Status: DC

## 2022-01-09 NOTE — Progress Notes (Signed)
?Chronic Care Management  ? ?Follow Up Note ? ?Summary: ?Patient presented for [facility] 2 training. ?Patient brought sensor to office. Patient opted to use smartphone as the reader. We downloaded Freestyle Libre 2 app on his phone and set up the app. Advised pt not to turn off phone so as to not to miss potential alarms. Provided overview of how to navigate app and use several features including adjusting alarm settings, logbook and daily patterns. ?  ?Demonstrated how to apply sensor and applied sensor to L arm today. Demonstrated how to pair sensor with [place] App, sensor was successfully paired. Advised pt how to navigate to app and scan sensor for sugar readings. Advised to scan at least every 8 hours for complete results. Advised pt to change sensor every 14 days or as directed by the app. Advised pt to avoid high doses of Vitamin C (>500 mg/day).  ?  ?Pt voiced understanding of above and denies further questions. ? ?Follow up Plan: ?-Pharmacist follow up televisit scheduled for 1 month ?-PCP f/u 04/02/22 ? ? ?01/09/2022 ?Name: Tony Manning MRN: [id number] DOB: May 24, 1961 ? ?Referred by: Tonia Ghent, MD ?Reason for referral : Chronic Care Management ([employer] training) ? ? ?Tony Manning is a 60 y.o. year old male who is a primary care patient of Tonia Ghent, MD. The CCM team was consulted for assistance with chronic disease management and care coordination needs.   ? ?Review of patient status, including review of consultants reports, relevant laboratory and other test results, and collaboration with appropriate care team members and the patient's provider was performed as part of comprehensive patient evaluation and provision of chronic care management services.   ? ? ?SDOH (Social Determinants of Health) assessments performed: No ?See Care Plan activities for detailed interventions related to [facility])  ?  ?Outpatient Encounter Medications as of 01/09/2022  ?Medication Sig  ?  amLODipine (NORVASC) 5 MG tablet Take 1 tablet (5 mg total) by mouth every morning.  ? aspirin 81 MG chewable tablet Chew 81 mg by mouth at bedtime.  ? atorvastatin (LIPITOR) 40 MG tablet TAKE ONE TABLET BY MOUTH EVERY MORNING  ? Blood Glucose Monitoring Suppl (ONETOUCH VERIO) w/Device KIT Use to check blood sugars TID. Dx: E11.9  ? carvedilol (COREG) 6.25 MG tablet TAKE ONE TABLET BY MOUTH TWICE DAILY  ? Cholecalciferol (VITAMIN D3) 50 MCG (2000 UT) capsule Take 1 capsule (2,000 Units total) by mouth daily.  ? Continuous Blood Gluc Receiver (FREESTYLE LIBRE 2 READER) DEVI Use with sensors to monitor sugar continuously  ? Continuous Blood Gluc Sensor (FREESTYLE LIBRE 2 SENSOR) MISC Apply sensor every 14 days to monitor sugar continously  ? cyanocobalamin (,VITAMIN B-12,) 1000 MCG/ML injection Inject 1 mL (1,000 mcg total) into the muscle every 14 (fourteen) days.  ? cyclobenzaprine (FLEXERIL) 5 MG tablet Take 1 tablet (5 mg total) by mouth 3 (three) times daily as needed for muscle spasms (sedation caution).  ? diclofenac Sodium (VOLTAREN) 1 % GEL Apply 2-4 g topically 4 (four) times daily.  ? famotidine (PEPCID) 20 MG tablet TAKE ONE TABLET BY MOUTH EVERYDAY AT BEDTIME  ? furosemide (LASIX) 20 MG tablet Take 1 tablet (20 mg total) by mouth daily as needed for fluid or edema.  ? glimepiride (AMARYL) 4 MG tablet TAKE ONE TABLET BY MOUTH TWICE DAILY  ? glucose blood (ONETOUCH VERIO) test strip USE TO check blood glucose THREE TIMES DAILY AS DIRECTED  ? Insulin Pen Needle 32G X 4 MM  MISC Use daily with insulin pen.  ? Insulin Syringe-Needle U-100 (INSULIN SYRINGE 1CC/31GX5/16") 31G X 5/16" 1 ML MISC Use with NPH insulin twice daily.  ? isosorbide mononitrate (IMDUR) 30 MG 24 hr tablet TAKE ONE TABLET BY MOUTH EVERYDAY AT BEDTIME  ? Lancets (ONETOUCH ULTRASOFT) lancets Use to check blood sugars TID. Dx: E11.9  ? losartan (COZAAR) 100 MG tablet TAKE ONE TABLET BY MOUTH EVERYDAY AT BEDTIME  ? metFORMIN (GLUCOPHAGE) 500 MG  tablet TAKE ONE TABLET BY MOUTH TWICE DAILY  ? nitroGLYCERIN (NITROSTAT) 0.4 MG SL tablet Place 1 tablet (0.4 mg total) under the tongue every 5 (five) minutes as needed for chest pain.  ? NOVOLIN N FLEXPEN 100 UNIT/ML FlexPen Inject 20 Units into the skin in the morning and at bedtime.  ? ondansetron (ZOFRAN) 4 MG tablet Take 4 mg by mouth every 8 (eight) hours as needed.  ? oxyCODONE-acetaminophen (PERCOCET/ROXICET) 5-325 MG tablet Take 1 tablet by mouth 4 (four) times daily.  ? pantoprazole (PROTONIX) 40 MG tablet TAKE ONE TABLET BY MOUTH EVERY MORNING  ? potassium chloride (KLOR-CON) 10 MEQ tablet Take 1 tablet (10 mEq total) by mouth daily as needed (with Lasix).  ? pregabalin (LYRICA) 25 MG capsule 1 tab in the AM and 2 in the PM.  ? Semaglutide, 1 MG/DOSE, 4 MG/3ML SOPN Inject 1 mg as directed once a week.  ? tamsulosin (FLOMAX) 0.4 MG CAPS capsule TAKE ONE CAPSULE BY MOUTH EVERY MORNING and TAKE ONE CAPSULE BY MOUTH EVERYDAY AT BEDTIME  ? [DISCONTINUED] pregabalin (LYRICA) 25 MG capsule Take 1 capsule (25 mg total) by mouth 2 (two) times daily.  ? ?No facility-administered encounter medications on file as of 01/09/2022.  ?  ? ?Objective:  ? ? Goals Addressed   ?None ?  ?  ?Patient Care Plan: [facility]  ?  ? ?Problem Identified: Hypertension, Hyperlipidemia, Diabetes, Coronary Artery Disease, and Chronic Kidney Disease   ?Priority: High  ?  ? ?Long-Range Goal: Disease mgmt   ?Start Date: 11/27/2020  ?Expected End Date: 11/22/2022  ?This Visit's Progress: On track  ?Priority: High  ?Note:   ?Current Barriers:  ?Uncontrolled diabetes ? ?Pharmacist Clinical Goal(s):  ?Patient will adhere to plan to optimize therapeutic regimen for diabetes as evidenced by report of adherence to recommended medication management changes through collaboration with PharmD and provider.  ? ?Interventions: ?1:1 collaboration with Tonia Ghent, MD regarding development and update of comprehensive plan of care as evidenced  by provider attestation and co-signature ?Inter-disciplinary care team collaboration (see longitudinal plan of care) ?Comprehensive medication review performed; medication list updated in electronic medical record ? ?Hypertension / CKD Stage 3b (BP goal <130/80) ?-Controlled - BP at goal in office  ?-Current treatment: ?Amodipine 10 mg daily - Appropriate, Effective, Safe, Accessible ?Carvedilol 6.25 mg BID -Appropriate, Effective, Safe, Accessible ?Isosorbide MN 30 mg HS -Appropriate, Effective, Safe, Accessible ?Losartan 100 mg daily -Appropriate, Effective, Safe, Accessible ?-Medications previously tried: n/a  ?-Educated on BP goals and benefits of medications for prevention of heart attack, stroke and kidney damage; ?-Counseled to monitor BP at home periodically, document, and provide log at future appointments ?-Recommended to continue current medication ? ?Hyperlipidemia / CAD (LDL goal < 70) ?-Controlled - LDL 59 ?-Hx CAD. NSTEMI 06/2014 ?-Current treatment: ?Atorvastatin 40 mg daily ?Aspirin 81 mg daily ?Isosorbide MN 30 mg daily ?Nitroglycerin 0.4 mg SL prn ?-Medications previously tried: none  ?-Recommended to continue current medication ? ?Diabetes (A1c goal <7%) ?-Not ideally controlled -  A1c 7.2% (06/2021); pt recently ran out of [place], though he was rationing it with 1/2 dose for several weeks prior; his income went up last year so he thought he would not qualify for PAP this year, but he reports his household taxable income was < $60,000 so he should still qualify ?-he also had "backed off on" using insulin when BG was well controlled on Ozempic. He only recently started using Insulin again. He also is recovering from COVID/URI which can elevate BG as well ?-Random BG: 200s per pt report ?-Current medications: ?Glimepiride 4 mg BID - Appropriate, Query Effective, ?Novolin N - 20 units BID -Appropriate, Query Effective, ?Metformin 500 mg BID -Appropriate, Query Effective, ?Ozempic 1 mg weekly  -Appropriate, Effective, Safe, Not Accessible ?-Medications previously tried: none reported ?-Pursue PAP for Ozempic. Printed forms and placed in front office for patient to come sign and provide proof of income

## 2022-01-09 NOTE — Progress Notes (Signed)
Neck pain. L neck and upper shoulder/trap area pain. He is going to try to have injection per pain clinic next week.  Less pain in the day, more pain at night.  Using a heating pad.  Taking lyrica '25mg'$  BID.  Pain with ROM, esp at night.  ? ?He was asking about getting labs done for cardiology, d/w pt.  Orders are already in EMR.  Has used lasix and potassium for the last 2 days, not yet today, with some dec in edema today.  SOB prev noted but better in the last few days.  Weight is down 3 lbs from 01/05/22.   ? ?Is going to restart ozempic this Sunday.  He is going to see pharmacy staff here in clinic after he sees me. ? ?Meds, vitals, and allergies reviewed.  ? ?ROS: Per HPI unless specifically indicated in ROS section  ? ?Nad ?Ncat ?Neck supple but L side of posterior neck ttp ?Rrr ?Ctab ?No BLE edema.  ?

## 2022-01-09 NOTE — Patient Instructions (Signed)
Visit Information ? ?Phone number for Pharmacist: [phone] ? ? Goals Addressed   ?None ?  ? ? ?Care Plan : [place]  ?Updates made by Charlton Haws, RPH since 01/09/2022 12:00 AM  ?  ? ?Problem: Hypertension, Hyperlipidemia, Diabetes, Coronary Artery Disease, and Chronic Kidney Disease   ?Priority: High  ?  ? ?Long-Range Goal: Disease mgmt   ?Start Date: 11/27/2020  ?Expected End Date: 11/22/2022  ?Recent Progress: On track  ?Priority: High  ?Note:   ?Current Barriers:  ?Uncontrolled diabetes ? ?Pharmacist Clinical Goal(s):  ?Patient will adhere to plan to optimize therapeutic regimen for diabetes as evidenced by report of adherence to recommended medication management changes through collaboration with PharmD and provider.  ? ?Interventions: ?1:1 collaboration with Tonia Ghent, MD regarding development and update of comprehensive plan of care as evidenced by provider attestation and co-signature ?Inter-disciplinary care team collaboration (see longitudinal plan of care) ?Comprehensive medication review performed; medication list updated in electronic medical record ? ?Hypertension / CKD Stage 3b (BP goal <130/80) ?-Controlled - BP at goal in office  ?-Current treatment: ?Amodipine 10 mg daily - Appropriate, Effective, Safe, Accessible ?Carvedilol 6.25 mg BID -Appropriate, Effective, Safe, Accessible ?Isosorbide MN 30 mg HS -Appropriate, Effective, Safe, Accessible ?Losartan 100 mg daily -Appropriate, Effective, Safe, Accessible ?-Medications previously tried: n/a  ?-Educated on BP goals and benefits of medications for prevention of heart attack, stroke and kidney damage; ?-Counseled to monitor BP at home periodically, document, and provide log at future appointments ?-Recommended to continue current medication ? ?Hyperlipidemia / CAD (LDL goal < 70) ?-Controlled - LDL 59 ?-Hx CAD. NSTEMI 06/2014 ?-Current treatment: ?Atorvastatin 40 mg daily -Appropriate, Effective, Safe,  Accessible ?Aspirin 81 mg daily -Appropriate, Effective, Safe, Accessible ?Isosorbide MN 30 mg daily -Appropriate, Effective, Safe, Accessible ?Nitroglycerin 0.4 mg SL prn -Appropriate, Effective, Safe, Accessible ?-Medications previously tried: none  ?-Recommended to continue current medication ? ?Diabetes (A1c goal <7%) ?-Not ideally controlled - A1c 7.2% (06/2021); pt is back on Ozempic, he was denied Novo Cares PAP due to income too high but reports it is affordable at the pharmacy now. ?-Pt presented today for [employer] training - see above ?-Current medications: ?Glimepiride 4 mg BID - Appropriate, Query Effective, ?Novolin N - 20 units BID -Appropriate, Query Effective, ?Metformin 500 mg BID -Appropriate, Query Effective, ?Ozempic 1 mg weekly -Appropriate, Effective, Safe, Accessible ?-Medications previously tried: none reported ?-Recommend to continue current medication ? ?Health Maintenance ?-Vaccine gaps: Shingrix, Covid booster ?-Chronic pain - cervical radiculitis, sciatica, osteoarthritis. Injections from other did not help, going to pain mgmt. ? ?Patient Goals/Self-Care Activities ?Patient will:  ?- take medications as prescribed as evidenced by patient report and record review ?focus on medication adherence by routine ?check glucose daily, document, and provide at future appointments ?collaborate with provider on medication access solutions (Ozempic) ? ?  ?  ? ?Patient verbalizes understanding of instructions and care plan provided today and agrees to view in [place]. Active MyChart status confirmed with patient.   ?Telephone follow up appointment with pharmacy team member scheduled for: 1 month ? ?Charlene Brooke, PharmD, BCACP ?Clinical Pharmacist ?[facility] ?[phone] ?  ?

## 2022-01-09 NOTE — Patient Instructions (Addendum)
Go to the lab on the way out.   If you have mychart we'll likely use that to update you.    ?Take care.  Glad to see you. ?Try taking 1 lyrica in the AM and 2 in the PM.  See if that helps the pain.  ?If you take furosemide, then take potassium.   ?If your weight is going back up, above 208, then take lasix that day.   ?

## 2022-01-11 NOTE — Assessment & Plan Note (Signed)
Discussed options. ?Reasonable to try taking 1 lyrica in the AM and 2 in the PM.  ?

## 2022-01-11 NOTE — Assessment & Plan Note (Signed)
Improved recently.  Weight is lower.  Discussed taking potassium when he takes Lasix.  Discussed conditions where he would take Lasix. ?If his weight is going back up, above 208, then take lasix that day.   ?He will need to weigh daily. ?Recheck labs.  Labs routed to cardiology for input. ?

## 2022-01-13 ENCOUNTER — Other Ambulatory Visit: Payer: Self-pay | Admitting: Student

## 2022-01-13 ENCOUNTER — Ambulatory Visit (INDEPENDENT_AMBULATORY_CARE_PROVIDER_SITE_OTHER): Payer: Medicare HMO

## 2022-01-13 DIAGNOSIS — E538 Deficiency of other specified B group vitamins: Secondary | ICD-10-CM | POA: Diagnosis not present

## 2022-01-13 DIAGNOSIS — R600 Localized edema: Secondary | ICD-10-CM

## 2022-01-13 MED ORDER — CYANOCOBALAMIN 1000 MCG/ML IJ SOLN
1000.0000 ug | Freq: Once | INTRAMUSCULAR | Status: AC
  Administered 2022-01-13: 1000 ug via INTRAMUSCULAR

## 2022-01-13 NOTE — Progress Notes (Signed)
Thank you, I will close the loop with the patient.

## 2022-01-13 NOTE — Progress Notes (Signed)
Per orders of Dr. Wynelle Beckmann, every 2 weeks injection of B12 1000 mcg/ml given by Pilar Grammes, CMA in Right Deltoid. ?Patient tolerated injection well. ? ?

## 2022-01-16 DIAGNOSIS — M5412 Radiculopathy, cervical region: Secondary | ICD-10-CM | POA: Diagnosis not present

## 2022-01-20 DIAGNOSIS — M4322 Fusion of spine, cervical region: Secondary | ICD-10-CM | POA: Diagnosis not present

## 2022-01-20 DIAGNOSIS — M47812 Spondylosis without myelopathy or radiculopathy, cervical region: Secondary | ICD-10-CM | POA: Diagnosis not present

## 2022-01-23 ENCOUNTER — Other Ambulatory Visit: Payer: Self-pay | Admitting: Family Medicine

## 2022-01-26 ENCOUNTER — Telehealth: Payer: Self-pay

## 2022-01-26 NOTE — Progress Notes (Signed)
Chronic Care Management Pharmacy Assistant   Name: Tony Manning  MRN: [id number] DOB: [birth year]  Reason for Encounter: CCM (Medication Adherence and Delivery Coordination)   Recent office visits:  01/13/22 VITAMIN B-12 injection 1,000 mcg  Recent consult visits:  None since last CCM contact  Hospital visits:  None in previous 6 months  Medications: Outpatient Encounter Medications as of 01/26/2022  Medication Sig   amLODipine (NORVASC) 5 MG tablet Take 1 tablet (5 mg total) by mouth every morning.   aspirin 81 MG chewable tablet Chew 81 mg by mouth at bedtime.   atorvastatin (LIPITOR) 40 MG tablet TAKE ONE TABLET BY MOUTH EVERY MORNING   Blood Glucose Monitoring Suppl (ONETOUCH VERIO) w/Device KIT Use to check blood sugars TID. Dx: E11.9   carvedilol (COREG) 6.25 MG tablet TAKE ONE TABLET BY MOUTH TWICE DAILY   Cholecalciferol (VITAMIN D3) 50 MCG (2000 UT) capsule Take 1 capsule (2,000 Units total) by mouth daily.   Continuous Blood Gluc Receiver (FREESTYLE LIBRE 2 READER) DEVI Use with sensors to monitor sugar continuously   Continuous Blood Gluc Sensor (FREESTYLE LIBRE 2 SENSOR) MISC Apply sensor every 14 days to monitor sugar continously   cyanocobalamin (,VITAMIN B-12,) 1000 MCG/ML injection Inject 1 mL (1,000 mcg total) into the muscle every 14 (fourteen) days.   cyclobenzaprine (FLEXERIL) 5 MG tablet Take 1 tablet (5 mg total) by mouth 3 (three) times daily as needed for muscle spasms (sedation caution).   diclofenac Sodium (VOLTAREN) 1 % GEL Apply 2-4 g topically 4 (four) times daily.   famotidine (PEPCID) 20 MG tablet TAKE ONE TABLET BY MOUTH EVERYDAY AT BEDTIME   furosemide (LASIX) 20 MG tablet Take 1 tablet (20 mg total) by mouth daily as needed for fluid or edema.   glimepiride (AMARYL) 4 MG tablet TAKE ONE TABLET BY MOUTH TWICE DAILY   glucose blood (ONETOUCH VERIO) test strip USE TO check blood glucose THREE TIMES DAILY AS DIRECTED   Insulin Pen Needle 32G X  4 MM MISC Use daily with insulin pen.   Insulin Syringe-Needle U-100 (INSULIN SYRINGE 1CC/31GX5/16") 31G X 5/16" 1 ML MISC Use with NPH insulin twice daily.   isosorbide mononitrate (IMDUR) 30 MG 24 hr tablet TAKE ONE TABLET BY MOUTH EVERYDAY AT BEDTIME   Lancets (ONETOUCH ULTRASOFT) lancets Use to check blood sugars TID. Dx: E11.9   losartan (COZAAR) 100 MG tablet TAKE ONE TABLET BY MOUTH EVERYDAY AT BEDTIME   metFORMIN (GLUCOPHAGE) 500 MG tablet TAKE ONE TABLET BY MOUTH TWICE DAILY   nitroGLYCERIN (NITROSTAT) 0.4 MG SL tablet Place 1 tablet (0.4 mg total) under the tongue every 5 (five) minutes as needed for chest pain.   NOVOLIN N FLEXPEN 100 UNIT/ML FlexPen Inject 20 Units into the skin in the morning and at bedtime.   ondansetron (ZOFRAN) 4 MG tablet Take 4 mg by mouth every 8 (eight) hours as needed.   oxyCODONE-acetaminophen (PERCOCET/ROXICET) 5-325 MG tablet Take 1 tablet by mouth 4 (four) times daily.   pantoprazole (PROTONIX) 40 MG tablet TAKE ONE TABLET BY MOUTH EVERY MORNING   potassium chloride (KLOR-CON) 10 MEQ tablet Take 1 tablet (10 mEq total) by mouth daily as needed (with Lasix).   pregabalin (LYRICA) 25 MG capsule 1 tab in the AM and 2 in the PM.   Semaglutide, 1 MG/DOSE, 4 MG/3ML SOPN Inject 1 mg as directed once a week.   tamsulosin (FLOMAX) 0.4 MG CAPS capsule TAKE ONE CAPSULE BY MOUTH EVERY MORNING and TAKE ONE CAPSULE  BY MOUTH EVERYDAY AT BEDTIME   No facility-administered encounter medications on file as of 01/26/2022.   BP Readings from Last 3 Encounters:  01/09/22 120/60  01/05/22 108/65  12/23/21 122/68    Lab Results  Component Value Date   HGBA1C 7.2 (H) 12/23/2021    Recent OV, Consult or Hospital visit:  No medication changes indicated  Last adherence delivery date: 12/31/2021      Patient is due for next adherence delivery on: 02/05/2022  Spoke with patient on 01/26/2022 reviewed medications and coordinated delivery.  This delivery to include:  Adherence Packaging  30 Days  Packs: Amlodipine 10mg - 1 tablet daily (1 breakfast) Aspirin 81mg - 1 tablet daily (breakfast) Metformin 500mg  1 tablet twice daily (1 tablet  breakfast, 1 tablet evening meal) Carvedilol 6.25mg - 1 tablet twice daily (1 breakfast, 1 evening meal) Isosorbide 30mg - 1 tablet daily (1 bedtime) Tamsulosin 0.4mg  2 capsule daily (1 breakfast, 1 bedtime)   Losartan 100mg - 1 tablet daily- (1 bedtime) Atorvastatin 40mg - 1 tablet daily (1 breakfast) Glimepiride 4mg - 1 tablet twice daily (1 breakfast, 1 evening meal) Pantoprazole 40 mg- 1 tablet daily (1 breakfast) Famotidine 20mg - 1 tablet daily (1 bedtime)   Vial medications: Ozempic - 1 mg weekly - patient was denied due to income being too high through [employer]. Patient would like to get through insurance for $47 through [facility].    Patient declined:  Novolin N Flexpen 100 unit/mL (3 mL) - Inject 16 units twice daily (uses 3 days per week on average)   Patient no longer using:  Onetouch Verio test strips - PRN - still has 100 test strips. - No longer uses due to continues blood glucose meter.  Lyrica - patient stopped taking due to swelling.  Furosemide - no longer taking  Patient states he currently has a kidney stone and is trying to get an appointment with Neurology. States they are so backed up you have to leave a message for someone to call you.    Any concerns about your medications? No   How often do you forget or accidentally miss a dose? Occasionally   Do you use a pillbox? No   Is patient in packaging Yes  What is the date on your next pill pack?  Any concerns or issues with your packaging?  Refills requested from providers include: Tamsulosin 0.4mg  2 capsule daily (1 breakfast, 1 bedtime)   Atorvastatin 40mg - 1 tablet daily (1 breakfast)  Confirmed delivery date of 01/26/2022, advised patient that pharmacy will contact them the morning of delivery.  Recent blood pressure  readings are as follows: Patient did not have readings available   Recent blood glucose readings are as follows: Patient did not have readings available   Annual wellness visit in last year? No Most Recent BP reading: 120/60 on 01/09/2022   If Diabetic: Most recent A1C reading: 7.2 on 06/09/2021 Last eye exam / retinopathy screening: Up to date Last diabetic foot exam: Up to date   Charlene Brooke, CPP notified   Marijean Niemann, [place] Assistant [phone]

## 2022-01-28 ENCOUNTER — Ambulatory Visit (INDEPENDENT_AMBULATORY_CARE_PROVIDER_SITE_OTHER): Payer: Medicare HMO

## 2022-01-28 DIAGNOSIS — E538 Deficiency of other specified B group vitamins: Secondary | ICD-10-CM | POA: Diagnosis not present

## 2022-01-28 DIAGNOSIS — M5412 Radiculopathy, cervical region: Secondary | ICD-10-CM | POA: Diagnosis not present

## 2022-01-28 DIAGNOSIS — M5116 Intervertebral disc disorders with radiculopathy, lumbar region: Secondary | ICD-10-CM | POA: Diagnosis not present

## 2022-01-28 DIAGNOSIS — M545 Low back pain, unspecified: Secondary | ICD-10-CM | POA: Diagnosis not present

## 2022-01-28 DIAGNOSIS — M479 Spondylosis, unspecified: Secondary | ICD-10-CM | POA: Diagnosis not present

## 2022-01-28 MED ORDER — CYANOCOBALAMIN 1000 MCG/ML IJ SOLN
1000.0000 ug | Freq: Once | INTRAMUSCULAR | Status: AC
  Administered 2022-01-28: 1000 ug via INTRAMUSCULAR

## 2022-01-28 NOTE — Progress Notes (Signed)
Per orders of Romilda Garret, NP, in the absence of Dr. [name], biweekly injection of B12 given by Loreen Freud. Patient tolerated injection well.

## 2022-01-29 NOTE — Progress Notes (Deleted)
Primary Physician/Referring:  Tonia Ghent, MD  Patient ID: Tony Manning, male    DOB: 07/25/1961, 61 y.o.   MRN: [id number]  No chief complaint on file.   HPI: Tony Manning  is a 61 y.o. male with coronary artery disease status post PCI to RCA and LAD, long-standing degenerative disc disease and involving neck and shoulders, sciatica, hyperlipidemia, OSA on CPAP, uncontrolled DM, hypertension.   Patient presented to our office for urgent visit 01/05/2022 with complaints of leg swelling and shortness of breath.  At that time patient's blood pressure was soft, therefore reduced amlodipine from 10 mg to 5 mg daily and advised patient to take Lasix 20 mg once daily for 3 days.  Also ordered repeat echocardiogram which revealed LVEF 60-65% and mild MR, otherwise unremarkable.  Patient now presents for 4-week follow-up. ***  ***Labs from PCP office?   Patient was last seen by Dr. Einar Gip 05/05/2021 at which time he was stable from a cardiovascular standpoint and advised to follow-up in 1 year.  However patient presents today in his request for urgent visit with complaints of leg swelling and shortness of breath over the last 2 weeks. He does admit to some dietary indsiscreation eluding difficulty reducing sodium intake.  He notes that shortness of breath occasionally occurs at rest, but is primarily with exertion.  Swelling is worst at the end of the days and mildly improved with compression socks.  His weight has trended up about 5 pounds since last office visit.  Denies orthopnea, PND.  Patient also notes an episode a couple of months ago when he was standing for long period of time at work and became dizzy and fell.  Denies loss of consciousness. He has had no recurrence of this.   Past Medical History:  Diagnosis Date   Anginal pain ([place])    Basal cell carcinoma of face    Chest pain, unspecified    Chronic kidney disease    kidney stones   Coronary atherosclerosis of native  coronary artery    s/p stent x5   Depression    Diabetes mellitus without mention of complication    Diverticulosis    Dyspnea    Dysrhythmia    Erectile dysfunction    Essential hypertension, benign    GERD (gastroesophageal reflux disease)    Heart murmur    History of kidney stones    History of nephrolithiasis    Hyperlipidemia    Migraine    Myocardial infarction ([place])    2016   Neuromuscular disorder ([place])    Osteoarthritis    Pneumonia    Postsurgical percutaneous transluminal coronary angioplasty status    s/p stent x4   Sarcoidosis    Sleep apnea    uses CPAP   Unspecified sleep apnea    uses C-pap   Past Surgical History:  Procedure Laterality Date   ANTERIOR CERVICAL DECOMP/DISCECTOMY FUSION N/A 04/07/2017   Procedure: ANTERIOR CERVICAL DECOMPRESSION FUSION, CERVICAL 4-5 WITH INSTRUMENTATION AND ALLOGRAFT;  Surgeon: Phylliss Bob, MD;  Location: [place];  Service: Orthopedics;  Laterality: N/A;  ANTERIOR CERVICAL DECOMPRESSION FUSION, CERVICAL 4-5 WITH INSTRUMENTATION AND ALLOGRAFT; REQUEST 2.5 HOURS AND FLIP ROOM   APPENDECTOMY  2005   CARPAL TUNNEL RELEASE     CERVICAL FUSION  2009   COLONOSCOPY     CORONARY ANGIOPLASTY WITH STENT PLACEMENT     2004   CORONARY STENT INTERVENTION N/A 10/16/2016   Procedure: Coronary Stent Intervention;  Surgeon: Adrian Prows, MD;  Location: [facility];  Service: Cardiovascular;  Laterality: N/A;   CYSTOSCOPY WITH RETROGRADE PYELOGRAM, URETEROSCOPY AND STENT PLACEMENT Right 05/12/2019   Procedure: CYSTOSCOPY WITH RIGHT RETROGRADE PYELOGRAM, URETEROSCOPY HOLMIUM LASER AND STENT PLACEMENT;  Surgeon: Lucas Mallow, MD;  Location: [facility];  Service: Urology;  Laterality: Right;   ELBOW SURGERY     bilateral   HAND SURGERY Left    KNEE SURGERY Bilateral    Bilateral    LEFT HEART CATH AND CORONARY ANGIOGRAPHY N/A 10/16/2016   Procedure: Left Heart Cath and Coronary Angiography;  Surgeon: Adrian Prows, MD;  Location: [facility];  Service: Cardiovascular;  Laterality: N/A;   LEFT HEART CATHETERIZATION WITH CORONARY ANGIOGRAM N/A 06/14/2014   Procedure: LEFT HEART CATHETERIZATION WITH CORONARY ANGIOGRAM;  Surgeon: Laverda Page, MD;  Location: [facility];  Service: Cardiovascular;  Laterality: N/A;   LUNG BIOPSY     sarcoid   ROTATOR CUFF REPAIR     left   SHOULDER ARTHROSCOPY WITH SUBACROMIAL DECOMPRESSION Left 06/16/2018   Procedure: LEFT SHOULDER ARTHROSCOPY WITH ROTATOR CUFF DEBRIDEMENT AND  SUBACROMIAL DECOMPRESSION;  Surgeon: Tania Ade, MD;  Location: [facility];  Service: Orthopedics;  Laterality: Left;   Social History   Tobacco Use   Smoking status: Never   Smokeless tobacco: Former    Types: Chew    Quit date: 02/06/2016  Substance Use Topics   Alcohol use: No    Alcohol/week: 0.0 standard drinks   Marital Status: Married   ROS  Review of Systems  Cardiovascular:  Positive for dyspnea on exertion and leg swelling. Negative for chest pain.  Musculoskeletal:  Positive for arthritis and back pain.  Gastrointestinal:  Negative for melena.   Objective  There were no vitals taken for this visit. There is no height or weight on file to calculate BMI.     01/09/2022   11:38 AM 01/05/2022    9:22 AM 12/23/2021   11:01 AM  Vitals with BMI  Height  $Remov'5\' 10"'yVsQPp$  $RemoveB'5\' 10"'FhxJbWAP$   Weight 208 lbs 211 lbs 13 oz 206 lbs  BMI 29.84 62.86 38.17  Systolic 711 657 903  Diastolic 60 65 68  Pulse 60 60 63    No data found.   Physical Exam Vitals reviewed.  Constitutional:      General: He is not in acute distress.    Appearance: He is well-developed.  Neck:     Thyroid: No thyromegaly.     Vascular: No carotid bruit or JVD.  Cardiovascular:     Rate and Rhythm: Normal rate and regular rhythm.     Pulses: Normal pulses and intact distal pulses.     Heart sounds: Normal heart sounds, S1 normal and S2 normal. No murmur heard.   No gallop.  Pulmonary:     Effort: Pulmonary effort is normal. No  respiratory distress.     Breath sounds: Normal breath sounds. No wheezing, rhonchi or rales.  Musculoskeletal:     Right lower leg: Edema (1+ to ankle) present.     Left lower leg: Edema (2+ to ankle) present.   Laboratory examination:      Latest Ref Rng & Units 01/09/2022   12:05 PM 12/23/2021   11:47 AM 06/09/2021    9:23 AM  CMP  Glucose 70 - 99 mg/dL 156   144   179    BUN 6 - 23 mg/dL $Remove'19   16   19    'CuSPomC$ Creatinine 0.40 - 1.50  mg/dL 1.56   1.28   1.73    Sodium 135 - 145 mEq/L 140   142   142    Potassium 3.5 - 5.1 mEq/L 4.9   4.3   4.1    Chloride 96 - 112 mEq/L 105   109   108    CO2 19 - 32 mEq/L $Remove'29   27   24    'AATjJkT$ Calcium 8.4 - 10.5 mg/dL 9.6   9.4   9.6    Total Protein 6.0 - 8.3 g/dL   6.8    Total Bilirubin 0.2 - 1.2 mg/dL   0.5    Alkaline Phos 39 - 117 U/L   90    AST 0 - 37 U/L   18    ALT 0 - 53 U/L   23        Latest Ref Rng & Units 12/23/2021   11:47 AM 06/09/2021    9:23 AM 01/27/2021   12:40 PM  CBC  WBC 4.0 - 10.5 K/uL 5.9   7.2   7.9    Hemoglobin 13.0 - 17.0 g/dL 13.4   13.7   14.0    Hematocrit 39.0 - 52.0 % 39.6   40.9   40.6    Platelets 150.0 - 400.0 K/uL 193.0   198.0   223.0     Lipid Panel     Component Value Date/Time   CHOL 112 06/09/2021 0923   TRIG 73.0 06/09/2021 0923   HDL 40.20 06/09/2021 0923   CHOLHDL 3 06/09/2021 0923   VLDL 14.6 06/09/2021 0923   LDLCALC 57 06/09/2021 0923    Lipid Panel Recent Labs    06/09/21 0923  CHOL 112  TRIG 73.0  LDLCALC 57  VLDL 14.6  HDL 40.20  CHOLHDL 3      HEMOGLOBIN A1C Lab Results  Component Value Date   HGBA1C 7.2 (H) 12/23/2021   MPG 180.03 05/11/2019   TSH No results for input(s): TSH in the last 8760 hours.   Allergies   Allergies  Allergen Reactions   Ambien [Zolpidem] Other (See Comments)    Parasomnias, sleep walking   Lisinopril Cough   Gabapentin Other (See Comments)    REACTION: lack of effect for pain   Tramadol Diarrhea    Medications Prior to Visit:    Outpatient Medications Prior to Visit  Medication Sig Dispense Refill   amLODipine (NORVASC) 5 MG tablet Take 1 tablet (5 mg total) by mouth every morning. 90 tablet 3   aspirin 81 MG chewable tablet Chew 81 mg by mouth at bedtime.     atorvastatin (LIPITOR) 40 MG tablet TAKE ONE TABLET BY MOUTH EVERY MORNING 90 tablet 2   Blood Glucose Monitoring Suppl (ONETOUCH VERIO) w/Device KIT Use to check blood sugars TID. Dx: E11.9 1 kit 0   carvedilol (COREG) 6.25 MG tablet TAKE ONE TABLET BY MOUTH TWICE DAILY 180 tablet 2   Cholecalciferol (VITAMIN D3) 50 MCG (2000 UT) capsule Take 1 capsule (2,000 Units total) by mouth daily.     Continuous Blood Gluc Receiver (FREESTYLE LIBRE 2 READER) DEVI Use with sensors to monitor sugar continuously 1 each 0   Continuous Blood Gluc Sensor (FREESTYLE LIBRE 2 SENSOR) MISC Apply sensor every 14 days to monitor sugar continously 2 each 5   cyanocobalamin (,VITAMIN B-12,) 1000 MCG/ML injection Inject 1 mL (1,000 mcg total) into the muscle every 14 (fourteen) days. 1 mL    cyclobenzaprine (FLEXERIL) 5 MG tablet Take 1 tablet (  5 mg total) by mouth 3 (three) times daily as needed for muscle spasms (sedation caution). 30 tablet 1   diclofenac Sodium (VOLTAREN) 1 % GEL Apply 2-4 g topically 4 (four) times daily. 100 g 5   famotidine (PEPCID) 20 MG tablet TAKE ONE TABLET BY MOUTH EVERYDAY AT BEDTIME 90 tablet 0   furosemide (LASIX) 20 MG tablet Take 1 tablet (20 mg total) by mouth daily as needed for fluid or edema. 30 tablet 3   glimepiride (AMARYL) 4 MG tablet TAKE ONE TABLET BY MOUTH TWICE DAILY 180 tablet 0   glucose blood (ONETOUCH VERIO) test strip USE TO check blood glucose THREE TIMES DAILY AS DIRECTED 300 strip 12   Insulin Pen Needle 32G X 4 MM MISC Use daily with insulin pen. 100 each 3   Insulin Syringe-Needle U-100 (INSULIN SYRINGE 1CC/31GX5/16") 31G X 5/16" 1 ML MISC Use with NPH insulin twice daily. 100 each 3   isosorbide mononitrate (IMDUR) 30 MG 24 hr  tablet TAKE ONE TABLET BY MOUTH EVERYDAY AT BEDTIME 90 tablet 2   Lancets (ONETOUCH ULTRASOFT) lancets Use to check blood sugars TID. Dx: E11.9 300 each 12   losartan (COZAAR) 100 MG tablet TAKE ONE TABLET BY MOUTH EVERYDAY AT BEDTIME 90 tablet 1   metFORMIN (GLUCOPHAGE) 500 MG tablet TAKE ONE TABLET BY MOUTH TWICE DAILY 180 tablet 2   nitroGLYCERIN (NITROSTAT) 0.4 MG SL tablet Place 1 tablet (0.4 mg total) under the tongue every 5 (five) minutes as needed for chest pain. 25 tablet 3   NOVOLIN N FLEXPEN 100 UNIT/ML FlexPen Inject 20 Units into the skin in the morning and at bedtime. 15 mL 11   ondansetron (ZOFRAN) 4 MG tablet Take 4 mg by mouth every 8 (eight) hours as needed.     oxyCODONE-acetaminophen (PERCOCET/ROXICET) 5-325 MG tablet Take 1 tablet by mouth 4 (four) times daily.     pantoprazole (PROTONIX) 40 MG tablet TAKE ONE TABLET BY MOUTH EVERY MORNING 90 tablet 0   potassium chloride (KLOR-CON) 10 MEQ tablet Take 1 tablet (10 mEq total) by mouth daily as needed (with Lasix). 30 tablet 3   pregabalin (LYRICA) 25 MG capsule 1 tab in the AM and 2 in the PM.     Semaglutide, 1 MG/DOSE, 4 MG/3ML SOPN Inject 1 mg as directed once a week. 3 mL 2   tamsulosin (FLOMAX) 0.4 MG CAPS capsule TAKE ONE CAPSULE BY MOUTH EVERY MORNING and TAKE ONE CAPSULE BY MOUTH EVERYDAY AT BEDTIME 60 capsule 2   No facility-administered medications prior to visit.   Final Medications at End of Visit    No outpatient medications have been marked as taking for the 02/03/22 encounter (Appointment) with Rayetta Pigg, Kaidence Sant C, PA-C.   Cardiac Studies:   Coronary angiogram 10/16/2016: 3.0 x 38 mm onyx DES Prox RCA. 06/14/2014: Thrombectomy of the mid RCA & Stenting with 3.0 x 18 mm Xience Alpine DES. Stenting mid LAD 3.0 x 18 mm Xience Alpine DES. Patent Mid LAD 2.75x12 mm Taxus, 2.5x12 Distal RCA stent x 2 from 2004 and 2007. Normal LVEF. Diffuse CAD.  PCV MYOCARDIAL PERFUSION WITH LEXISCAN 10/30/2020 Lexiscan nuclear  stress test performed using 1-day protocol. SPECT imaging shows diaphragmatic tissue attenuation, but otherwise normal myocardial perfusion. Stress LVEF 63%. Low risk study.  Echocardiogram 01/07/2022:  Normal LV systolic function with visual EF 60-65%. Left ventricle cavity  is normal in size. Normal left ventricular wall thickness. Normal global  wall motion. Normal diastolic filling pattern, normal LAP.  Mild (Grade I) mitral regurgitation.  Compared to 02/01/2018 mild MR is new otherwise no significant change.  EKG  01/05/2022: Normal sinus rhythm at a rate of 61 bpm.  Normal axis.  No evidence of ischemia or underlying injury pattern.  Compared to EKG 05/05/2021, no PR WP  05/05/2021: Normal sinus rhythm at rate of 60 bpm, poor R wave progression, cannot exclude anteroseptal infarct old.  No evidence of ischemia.  No significant change from 07/23/2020.   Assessment   No diagnosis found.  There are no discontinued medications.   No orders of the defined types were placed in this encounter.   Recommendations:   Tony Manning  is a 61 y.o. male  with coronary artery disease status post PCI to RCA and LAD, long-standing degenerative disc disease and involving neck and shoulders, sciatica, hyperlipidemia, OSA on CPAP, uncontrolled DM, hypertension.   Patient presented to our office for urgent visit 01/05/2022 with complaints of leg swelling and shortness of breath.  At that time patient's blood pressure was soft, therefore reduced amlodipine from 10 mg to 5 mg daily and advised patient to take Lasix 20 mg once daily for 3 days.  Also ordered repeat echocardiogram which revealed LVEF 60-65% and mild MR, otherwise unremarkable.  Patient now presents for 4-week follow-up. ***  ***Labs from PCP office?   Patient was last seen by Dr. Einar Gip 05/05/2021 at which time he was stable from a cardiovascular standpoint and advised to follow-up in 1 year.  However patient presents today in his  request for urgent visit with complaints of leg swelling and shortness of breath.  Patient's blood pressure is well controlled and in fact is soft although he has not quite orthostatic in the office today.  We will therefore reduce amlodipine from 10 mg to 5 mg daily given episode of dizziness with fall a few months ago at work.  In regard to shortness of breath and leg swelling patient will take Lasix 20 mg once daily along with potassium supplement for the next 3 days.  We will have repeat BMP as well as BNP done at PCPs office this Friday.  We will also obtain repeat echocardiogram.  Patient had stress test done a little over a year ago which was overall low risk, will hold off on further ischemic evaluation at this time as patient has had no angina pectoris and EKG is without ischemic changes.  Follow-up in 4 weeks, sooner if needed.   Alethia Berthold, PA-C 01/29/2022, 4:21 PM Office: [phone]

## 2022-01-31 DIAGNOSIS — E1149 Type 2 diabetes mellitus with other diabetic neurological complication: Secondary | ICD-10-CM | POA: Diagnosis not present

## 2022-01-31 DIAGNOSIS — Z794 Long term (current) use of insulin: Secondary | ICD-10-CM | POA: Diagnosis not present

## 2022-02-03 ENCOUNTER — Telehealth: Payer: Self-pay | Admitting: Family Medicine

## 2022-02-03 ENCOUNTER — Ambulatory Visit: Payer: Medicare HMO | Admitting: Student

## 2022-02-03 DIAGNOSIS — R0602 Shortness of breath: Secondary | ICD-10-CM

## 2022-02-03 DIAGNOSIS — R600 Localized edema: Secondary | ICD-10-CM

## 2022-02-03 NOTE — Telephone Encounter (Signed)
Minette Brine said that when she was on phone with pt she could not transfer call to access nurse and gave access nurse the # to call pt. I am not sure if pt has spoken with access nurse. I do not have a report from access nurse. I tried to call pt and his wife (DPR signed) and left v/m with pt to call [place]. Sending note to [facility] and Dr [name].

## 2022-02-03 NOTE — Telephone Encounter (Signed)
Pt called and was "complaining of chest pains, he stated he has taken 2 Covid tests both were negative, has not been throwing up anything just tightness in chest. I transferred him to Access Nurse."  [place] number: [phone]

## 2022-02-04 ENCOUNTER — Telehealth: Payer: Medicare HMO | Admitting: Family

## 2022-02-04 DIAGNOSIS — N1832 Chronic kidney disease, stage 3b: Secondary | ICD-10-CM | POA: Diagnosis not present

## 2022-02-04 DIAGNOSIS — E1122 Type 2 diabetes mellitus with diabetic chronic kidney disease: Secondary | ICD-10-CM | POA: Diagnosis not present

## 2022-02-04 DIAGNOSIS — I129 Hypertensive chronic kidney disease with stage 1 through stage 4 chronic kidney disease, or unspecified chronic kidney disease: Secondary | ICD-10-CM | POA: Diagnosis not present

## 2022-02-04 DIAGNOSIS — Z7984 Long term (current) use of oral hypoglycemic drugs: Secondary | ICD-10-CM | POA: Diagnosis not present

## 2022-02-04 DIAGNOSIS — Z87891 Personal history of nicotine dependence: Secondary | ICD-10-CM

## 2022-02-04 DIAGNOSIS — E785 Hyperlipidemia, unspecified: Secondary | ICD-10-CM

## 2022-02-04 DIAGNOSIS — Z794 Long term (current) use of insulin: Secondary | ICD-10-CM

## 2022-02-04 DIAGNOSIS — Z7985 Long-term (current) use of injectable non-insulin antidiabetic drugs: Secondary | ICD-10-CM | POA: Diagnosis not present

## 2022-02-04 NOTE — Telephone Encounter (Signed)
Patient called back and scheduled an appt for 02/05/22 at 3:00 pm.

## 2022-02-04 NOTE — Telephone Encounter (Signed)
Noted.  Please try to get update on patient.  Thanks.

## 2022-02-05 ENCOUNTER — Telehealth (INDEPENDENT_AMBULATORY_CARE_PROVIDER_SITE_OTHER): Payer: Medicare HMO | Admitting: Family Medicine

## 2022-02-05 ENCOUNTER — Ambulatory Visit: Payer: Medicare HMO | Admitting: Family Medicine

## 2022-02-05 VITALS — BP 124/72 | Ht 70.0 in | Wt 190.0 lb

## 2022-02-05 DIAGNOSIS — R051 Acute cough: Secondary | ICD-10-CM | POA: Insufficient documentation

## 2022-02-05 MED ORDER — GUAIFENESIN-CODEINE 100-10 MG/5ML PO SYRP: 5.0000 mL | ORAL_SOLUTION | Freq: Every evening | ORAL | 0 refills | Status: DC | PRN

## 2022-02-05 MED ORDER — AZITHROMYCIN 250 MG PO TABS: ORAL_TABLET | ORAL | 0 refills | Status: DC

## 2022-02-05 NOTE — Assessment & Plan Note (Signed)
New onset, worsening  Negative COVID test x2.  Given the symptoms are ongoing greater than 7 to 10 days there is more concern for bacterial superinfection on top of viral syndrome.  I will treat with symptomatic care with Mucinex and prescription cough suppressant, but will cover with antibiotics for bacterial superinfection.  Sent in prescription for azithromycin x5 days.  Turn and ER precautions reviewed in detail with patient.  Patient agreeable.

## 2022-02-05 NOTE — Progress Notes (Signed)
VIRTUAL VISIT A virtual visit is felt to be most appropriate for this patient at this time.   I connected with the patient on 02/05/22 at 11:40 AM EDT by virtual telehealth platform and verified that I am speaking with the correct person using two identifiers.   I discussed the limitations, risks, security and privacy concerns of performing an evaluation and management service by  virtual telehealth platform and the availability of in person appointments. I also discussed with the patient that there may be a patient responsible charge related to this service. The patient expressed understanding and agreed to proceed.  Patient location: Home Provider Location: [facility] Participants: Eliezer Lofts and [name]   Chief Complaint  Patient presents with   Nasal Congestion    History of Present Illness: 61 year old male patient with history of  DM and HTN presents with new onset cough.   Date of onset: 7 days ago  Initially, he started with body ache, ST, fever low grade. Progressed to chest tightness, burning, productive cough  Cough keeping him up at night.  Mild DOE, no wheezing.  No fever now.   He has tried Claritin, mucinex and syrup.   Hx of DM, HTN  No face pain or ear pain.  No asthma or COPD. Former smoker  No sick contacts.   COVID 19 screen COVID testing: 2 negative test  on day 3 and day 5 COVID vaccine: COVID exposure: No recent travel or known exposure to COVID19  The importance of social distancing was discussed today.    Review of Systems  Constitutional:  Negative for chills and fever.  HENT:  Positive for congestion. Negative for ear discharge, ear pain, hearing loss, sinus pain and sore throat.   Eyes:  Negative for pain and redness.  Respiratory:  Negative for cough and shortness of breath.   Cardiovascular:  Negative for chest pain, palpitations and leg swelling.  Gastrointestinal:  Negative for abdominal pain, blood in stool,  constipation, diarrhea, nausea and vomiting.  Genitourinary:  Negative for dysuria.  Musculoskeletal:  Negative for falls and myalgias.  Skin:  Negative for rash.  Neurological:  Negative for dizziness.  Psychiatric/Behavioral:  Negative for depression. The patient is not nervous/anxious.      Past Medical History:  Diagnosis Date   Anginal pain ([place])    Basal cell carcinoma of face    Chest pain, unspecified    Chronic kidney disease    kidney stones   Coronary atherosclerosis of native coronary artery    s/p stent x5   Depression    Diabetes mellitus without mention of complication    Diverticulosis    Dyspnea    Dysrhythmia    Erectile dysfunction    Essential hypertension, benign    GERD (gastroesophageal reflux disease)    Heart murmur    History of kidney stones    History of nephrolithiasis    Hyperlipidemia    Migraine    Myocardial infarction ([place])    2016   Neuromuscular disorder ([place])    Osteoarthritis    Pneumonia    Postsurgical percutaneous transluminal coronary angioplasty status    s/p stent x4   Sarcoidosis    Sleep apnea    uses CPAP   Unspecified sleep apnea    uses C-pap    reports that he has never smoked. He quit smokeless tobacco use about 6 years ago.  His smokeless tobacco use included chew. He reports that he does not  drink alcohol and does not use drugs.   Current Outpatient Medications:    amLODipine (NORVASC) 5 MG tablet, Take 1 tablet (5 mg total) by mouth every morning., Disp: 90 tablet, Rfl: 3   aspirin 81 MG chewable tablet, Chew 81 mg by mouth at bedtime., Disp: , Rfl:    atorvastatin (LIPITOR) 40 MG tablet, TAKE ONE TABLET BY MOUTH EVERY MORNING, Disp: 90 tablet, Rfl: 2   Blood Glucose Monitoring Suppl (ONETOUCH VERIO) w/Device KIT, Use to check blood sugars TID. Dx: E11.9, Disp: 1 kit, Rfl: 0   carvedilol (COREG) 6.25 MG tablet, TAKE ONE TABLET BY MOUTH TWICE DAILY, Disp: 180 tablet, Rfl: 2   Cholecalciferol (VITAMIN D3) 50 MCG  (2000 UT) capsule, Take 1 capsule (2,000 Units total) by mouth daily., Disp: , Rfl:    Continuous Blood Gluc Receiver (FREESTYLE LIBRE 2 READER) DEVI, Use with sensors to monitor sugar continuously, Disp: 1 each, Rfl: 0   Continuous Blood Gluc Sensor (FREESTYLE LIBRE 2 SENSOR) MISC, Apply sensor every 14 days to monitor sugar continously, Disp: 2 each, Rfl: 5   cyanocobalamin (,VITAMIN B-12,) 1000 MCG/ML injection, Inject 1 mL (1,000 mcg total) into the muscle every 14 (fourteen) days., Disp: 1 mL, Rfl:    cyclobenzaprine (FLEXERIL) 5 MG tablet, Take 1 tablet (5 mg total) by mouth 3 (three) times daily as needed for muscle spasms (sedation caution)., Disp: 30 tablet, Rfl: 1   diclofenac Sodium (VOLTAREN) 1 % GEL, Apply 2-4 g topically 4 (four) times daily., Disp: 100 g, Rfl: 5   famotidine (PEPCID) 20 MG tablet, TAKE ONE TABLET BY MOUTH EVERYDAY AT BEDTIME, Disp: 90 tablet, Rfl: 0   furosemide (LASIX) 20 MG tablet, Take 1 tablet (20 mg total) by mouth daily as needed for fluid or edema., Disp: 30 tablet, Rfl: 3   glimepiride (AMARYL) 4 MG tablet, TAKE ONE TABLET BY MOUTH TWICE DAILY, Disp: 180 tablet, Rfl: 0   glucose blood (ONETOUCH VERIO) test strip, USE TO check blood glucose THREE TIMES DAILY AS DIRECTED, Disp: 300 strip, Rfl: 12   Insulin Pen Needle 32G X 4 MM MISC, Use daily with insulin pen., Disp: 100 each, Rfl: 3   Insulin Syringe-Needle U-100 (INSULIN SYRINGE 1CC/31GX5/16") 31G X 5/16" 1 ML MISC, Use with NPH insulin twice daily., Disp: 100 each, Rfl: 3   isosorbide mononitrate (IMDUR) 30 MG 24 hr tablet, TAKE ONE TABLET BY MOUTH EVERYDAY AT BEDTIME, Disp: 90 tablet, Rfl: 2   Lancets (ONETOUCH ULTRASOFT) lancets, Use to check blood sugars TID. Dx: E11.9, Disp: 300 each, Rfl: 12   losartan (COZAAR) 100 MG tablet, TAKE ONE TABLET BY MOUTH EVERYDAY AT BEDTIME, Disp: 90 tablet, Rfl: 1   metFORMIN (GLUCOPHAGE) 500 MG tablet, TAKE ONE TABLET BY MOUTH TWICE DAILY, Disp: 180 tablet, Rfl: 2    nitroGLYCERIN (NITROSTAT) 0.4 MG SL tablet, Place 1 tablet (0.4 mg total) under the tongue every 5 (five) minutes as needed for chest pain., Disp: 25 tablet, Rfl: 3   NOVOLIN N FLEXPEN 100 UNIT/ML FlexPen, Inject 20 Units into the skin in the morning and at bedtime., Disp: 15 mL, Rfl: 11   ondansetron (ZOFRAN) 4 MG tablet, Take 4 mg by mouth every 8 (eight) hours as needed., Disp: , Rfl:    oxyCODONE-acetaminophen (PERCOCET/ROXICET) 5-325 MG tablet, Take 1 tablet by mouth 4 (four) times daily., Disp: , Rfl:    pantoprazole (PROTONIX) 40 MG tablet, TAKE ONE TABLET BY MOUTH EVERY MORNING, Disp: 90 tablet, Rfl: 0   potassium  chloride (KLOR-CON) 10 MEQ tablet, Take 1 tablet (10 mEq total) by mouth daily as needed (with Lasix)., Disp: 30 tablet, Rfl: 3   pregabalin (LYRICA) 25 MG capsule, 1 tab in the AM and 2 in the PM., Disp: , Rfl:    Semaglutide, 1 MG/DOSE, 4 MG/3ML SOPN, Inject 1 mg as directed once a week., Disp: 3 mL, Rfl: 2   tamsulosin (FLOMAX) 0.4 MG CAPS capsule, TAKE ONE CAPSULE BY MOUTH EVERY MORNING and TAKE ONE CAPSULE BY MOUTH EVERYDAY AT BEDTIME, Disp: 60 capsule, Rfl: 2   Observations/Objective: Blood pressure 124/72, height $RemoveBefore'5\' 10"'[id number]$  (1.778 m), weight 190 lb (86.2 kg).    Physical Exam Constitutional:      General: The patient is not in acute distress. Pulmonary:     Effort: Pulmonary effort is normal. No respiratory distress.  Neurological:     Mental Status: The patient is alert and oriented to person, place, and time.  Psychiatric:        Mood and Affect: Mood normal.        Behavior: Behavior normal.   Assessment and Plan Problem List Items Addressed This Visit     Acute cough - Primary    New onset, worsening  Negative COVID test x2.  Given the symptoms are ongoing greater than 7 to 10 days there is more concern for bacterial superinfection on top of viral syndrome.  I will treat with symptomatic care with Mucinex and prescription cough suppressant, but will cover with  antibiotics for bacterial superinfection.  Sent in prescription for azithromycin x5 days.  Turn and ER precautions reviewed in detail with patient.  Patient agreeable.          I discussed the assessment and treatment plan with the patient. The patient was provided an opportunity to ask questions and all were answered. The patient agreed with the plan and demonstrated an understanding of the instructions.   The patient was advised to call back or seek an in-person evaluation if the symptoms worsen or if the condition fails to improve as anticipated.     Eliezer Lofts, MD

## 2022-02-11 ENCOUNTER — Ambulatory Visit (INDEPENDENT_AMBULATORY_CARE_PROVIDER_SITE_OTHER): Payer: Medicare HMO

## 2022-02-11 DIAGNOSIS — E538 Deficiency of other specified B group vitamins: Secondary | ICD-10-CM

## 2022-02-11 MED ORDER — CYANOCOBALAMIN 1000 MCG/ML IJ SOLN
1000.0000 ug | Freq: Once | INTRAMUSCULAR | Status: AC
  Administered 2022-02-11: 1000 ug via INTRAMUSCULAR

## 2022-02-11 NOTE — Progress Notes (Signed)
Per orders of Dr. Javier Gutierrez, injection of B-12 given by Iviona Hole in right deltoid. Patient tolerated injection well.     

## 2022-02-16 ENCOUNTER — Encounter: Payer: Self-pay | Admitting: Adult Health

## 2022-02-16 ENCOUNTER — Ambulatory Visit: Payer: Medicare HMO | Admitting: Adult Health

## 2022-02-16 ENCOUNTER — Telehealth: Payer: Self-pay

## 2022-02-16 ENCOUNTER — Ambulatory Visit (INDEPENDENT_AMBULATORY_CARE_PROVIDER_SITE_OTHER): Payer: Medicare HMO

## 2022-02-16 VITALS — BP 114/64 | HR 74 | Temp 98.2°F | Ht 70.0 in | Wt 195.6 lb

## 2022-02-16 DIAGNOSIS — D869 Sarcoidosis, unspecified: Secondary | ICD-10-CM | POA: Diagnosis not present

## 2022-02-16 DIAGNOSIS — G4730 Sleep apnea, unspecified: Secondary | ICD-10-CM | POA: Diagnosis not present

## 2022-02-16 NOTE — Addendum Note (Signed)
Addended by: Vanessa Barbara on: 02/16/2022 09:46 AM   Modules accepted: Orders

## 2022-02-16 NOTE — Assessment & Plan Note (Addendum)
OSA -longstanding sleep apnea with reported CPAP compliance.  CPAP download has been requested.  Patient says he cannot sleep without his CPAP.  Previous CPAP download last year showed excellent control and compliance will adjust CPAP pressure for comfort.  Patient has had some significant weight loss.  Change CPAP to 5 to 12 cm H2O.  We will check CPAP download now and in 1 month. Advised may use some saline nasal spray and gel to help with dryness   Plan  Patient Instructions  CPAP download now and 1 month.  Change CPAP pressure to 5 to 12cm H2O .  Continue on CPAP At bedtime   Keep up good work  Saline nasal spray Twice daily   Saline nasal gel  At bedtime   Do not drive if sleepy  Work on healthy weight .   Set up for PFT  Chest xray today  Follow up with Dr. Ander Slade in 6 months or Kalifa Cadden NP and As needed

## 2022-02-16 NOTE — Patient Instructions (Addendum)
CPAP download now and 1 month.  Change CPAP pressure to 5 to 12cm H2O .  Continue on CPAP At bedtime   Keep up good work  Saline nasal spray Twice daily   Saline nasal gel  At bedtime   Do not drive if sleepy  Work on healthy weight .   Set up for PFT  Chest xray today  Follow up with Dr. Ander Slade in 6 months or Savera Donson NP and As needed

## 2022-02-16 NOTE — Assessment & Plan Note (Signed)
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  Unable to find records.  We will check chest x-ray today.  Previous LFTs were normal in 2021.  PFTs are pending  Plan  Patient Instructions  CPAP download now and 1 month.  Change CPAP pressure to 5 to 12cm H2O .  Continue on CPAP At bedtime   Keep up good work  Saline nasal spray Twice daily   Saline nasal gel  At bedtime   Do not drive if sleepy  Work on healthy weight .   Set up for PFT  Chest xray today  Follow up with Dr. Ander Slade in 6 months or Fountain Derusha NP and As needed

## 2022-02-16 NOTE — Progress Notes (Signed)
Chronic Care Management Pharmacy Assistant   Name: Tony Manning  MRN: [id number] DOB: 1960/12/14  Reason for Encounter: CCM (Appointment Reminder)  Medications: Outpatient Encounter Medications as of 02/16/2022  Medication Sig   amLODipine (NORVASC) 5 MG tablet Take 1 tablet (5 mg total) by mouth every morning.   aspirin 81 MG chewable tablet Chew 81 mg by mouth at bedtime.   atorvastatin (LIPITOR) 40 MG tablet TAKE ONE TABLET BY MOUTH EVERY MORNING   Blood Glucose Monitoring Suppl (ONETOUCH VERIO) w/Device KIT Use to check blood sugars TID. Dx: E11.9   carvedilol (COREG) 6.25 MG tablet TAKE ONE TABLET BY MOUTH TWICE DAILY   Cholecalciferol (VITAMIN D3) 50 MCG (2000 UT) capsule Take 1 capsule (2,000 Units total) by mouth daily.   Continuous Blood Gluc Receiver (FREESTYLE LIBRE 2 READER) DEVI Use with sensors to monitor sugar continuously   Continuous Blood Gluc Sensor (FREESTYLE LIBRE 2 SENSOR) MISC Apply sensor every 14 days to monitor sugar continously   cyanocobalamin (,VITAMIN B-12,) 1000 MCG/ML injection Inject 1 mL (1,000 mcg total) into the muscle every 14 (fourteen) days.   cyclobenzaprine (FLEXERIL) 5 MG tablet Take 1 tablet (5 mg total) by mouth 3 (three) times daily as needed for muscle spasms (sedation caution).   diclofenac Sodium (VOLTAREN) 1 % GEL Apply 2-4 g topically 4 (four) times daily.   famotidine (PEPCID) 20 MG tablet TAKE ONE TABLET BY MOUTH EVERYDAY AT BEDTIME   glimepiride (AMARYL) 4 MG tablet TAKE ONE TABLET BY MOUTH TWICE DAILY   glucose blood (ONETOUCH VERIO) test strip USE TO check blood glucose THREE TIMES DAILY AS DIRECTED   guaiFENesin-codeine (ROBITUSSIN AC) 100-10 MG/5ML syrup Take 5-10 mLs by mouth at bedtime as needed for cough.   Insulin Pen Needle 32G X 4 MM MISC Use daily with insulin pen.   Insulin Syringe-Needle U-100 (INSULIN SYRINGE 1CC/31GX5/16") 31G X 5/16" 1 ML MISC Use with NPH insulin twice daily.   isosorbide mononitrate (IMDUR) 30  MG 24 hr tablet TAKE ONE TABLET BY MOUTH EVERYDAY AT BEDTIME   Lancets (ONETOUCH ULTRASOFT) lancets Use to check blood sugars TID. Dx: E11.9   losartan (COZAAR) 100 MG tablet TAKE ONE TABLET BY MOUTH EVERYDAY AT BEDTIME   metFORMIN (GLUCOPHAGE) 500 MG tablet TAKE ONE TABLET BY MOUTH TWICE DAILY   nitroGLYCERIN (NITROSTAT) 0.4 MG SL tablet Place 1 tablet (0.4 mg total) under the tongue every 5 (five) minutes as needed for chest pain. (Patient not taking: Reported on 02/16/2022)   NOVOLIN N FLEXPEN 100 UNIT/ML FlexPen Inject 20 Units into the skin in the morning and at bedtime.   ondansetron (ZOFRAN) 4 MG tablet Take 4 mg by mouth every 8 (eight) hours as needed. (Patient not taking: Reported on 02/16/2022)   oxyCODONE-acetaminophen (PERCOCET/ROXICET) 5-325 MG tablet Take 1 tablet by mouth 4 (four) times daily. (Patient not taking: Reported on 02/16/2022)   pantoprazole (PROTONIX) 40 MG tablet TAKE ONE TABLET BY MOUTH EVERY MORNING   Semaglutide, 1 MG/DOSE, 4 MG/3ML SOPN Inject 1 mg as directed once a week.   tamsulosin (FLOMAX) 0.4 MG CAPS capsule TAKE ONE CAPSULE BY MOUTH EVERY MORNING and TAKE ONE CAPSULE BY MOUTH EVERYDAY AT BEDTIME   No facility-administered encounter medications on file as of 02/16/2022.   Tony Manning was contacted to remind of upcoming telephone visit with Charlene Brooke on 02/19/2022 at 3:45. Patient was reminded to have any blood glucose and blood pressure readings available for review at appointment.   Message was  left reminding patient of appointment.  CCM referral has been placed prior to visit?  No   Star Rating Drugs: Medication:  Last Fill: Day Supply Atorvastatin 40 mg 01/30/2022 30 Losartan 100 mg 01/30/2022 30 Metformin 500 mg 01/30/2022 30  Ozempic 1 mg  01/30/2022 [place], CPP notified  Marijean Niemann, [place] Pharmacy Assistant [phone]

## 2022-02-16 NOTE — Progress Notes (Signed)
_0  ID: Tony Manning, male    DOB: February 19, 1961, 61 y.o.   MRN: [id number]  Chief Complaint  Patient presents with   Follow-up    Referring provider: Tonia Ghent, MD  HPI: 61 year old male never smoker seen for sleep and pulmonary consult November 12, 2020 to establish for sleep apnea and sarcoidosis Medical history significant for coronary artery disease status post PCI to RCA and LAD, hypertension, diabetes, hyperlipidemia and DDD, DM  Previous followed by Dr. Humphrey Rolls.   TEST/EVENTS :  Chest x-ray November 2021 lungs clear   2D echo May 2019 EF 55%   October 30, 2020 nuclear stress test normal  02/16/2022 Follow up : OSA and Sarcoid  Patient returns for a follow-up visit.  Last seen November 12, 2020 , at that visit patient was seen for initial evaluation to establish for sleep apnea and sarcoidosis.  Patient says he was diagnosed with sarcoidosis years ago.  Underwent a lung biopsy greater than 20 years ago.  Diagnosed with pulmonary sarcoid.  Was treated with steroids for short period of time and told that sarcoid went away.  Patient denies any known other organ involvement.  Patient was recommended to have PFT set up. . Says he is trying to be more active. No rash or cough . Some shortness of breath with heavy activity. Sedentary with chronic pain, on disability. Did not go for PFT, says he will go now .   Patient has underlying sleep apnea.  Patient says he was diagnosed with sleep apnea greater than 20 years ago.  Has been on CPAP for very long time.  Wears his CPAP every single night cannot sleep without it.  Usually gets in about 6-7  hours of sleep each night. Uses nasal mask.  CPAP download last year showed excellent compliance with 100% usage and AHI at 0.8.  Patient was set on CPAP 8 cm H2O.  CPAP download was requested today.  Unable to get automated download.  [facility] his DME has been contacted. Says he feel pressure is too high . Has lost 30lbs . Now on Ozempic .   Wants to see if pressure can be adjusted.  Also complains of dry mouth .  On Flomax for recurrent kidney stones. Gets up several times a night to go to bathroom .    Allergies  Allergen Reactions   Ambien [Zolpidem] Other (See Comments)    Parasomnias, sleep walking   Lisinopril Cough   Gabapentin Other (See Comments)    REACTION: lack of effect for pain   Tramadol Diarrhea    Immunization History  Administered Date(s) Administered   Influenza Inj Mdck Quad Pf 06/02/2018   Influenza Whole 09/17/2010   Influenza,inj,Quad PF,6+ Mos 06/08/2013, 05/22/2014, 05/12/2016, 05/08/2019, 05/27/2020   Influenza-Unspecified 06/08/2013, 05/22/2014, 06/08/2015, 05/12/2016, 05/21/2017, 06/02/2018, 05/09/2019, 05/21/2021   PFIZER(Purple Top)SARS-COV-2 Vaccination 11/27/2019, 12/25/2019, 08/07/2020, 05/21/2021   Pneumococcal Polysaccharide-23 09/08/2007, 02/16/2013   Td 09/07/2009    Past Medical History:  Diagnosis Date   Anginal pain ([place])    Basal cell carcinoma of face    Chest pain, unspecified    Chronic kidney disease    kidney stones   Coronary atherosclerosis of native coronary artery    s/p stent x5   Depression    Diabetes mellitus without mention of complication    Diverticulosis    Dyspnea    Dysrhythmia    Erectile dysfunction    Essential hypertension, benign    GERD (gastroesophageal reflux disease)  Heart murmur    History of kidney stones    History of nephrolithiasis    Hyperlipidemia    Migraine    Myocardial infarction ([place])    2016   Neuromuscular disorder ([place])    Osteoarthritis    Pneumonia    Postsurgical percutaneous transluminal coronary angioplasty status    s/p stent x4   Sarcoidosis    Sleep apnea    uses CPAP   Unspecified sleep apnea    uses C-pap    Tobacco History: Social History   Tobacco Use  Smoking Status Never  Smokeless Tobacco Former   Types: Chew   Quit date: 02/06/2016   Counseling given: Not Answered   Outpatient  Medications Prior to Visit  Medication Sig Dispense Refill   amLODipine (NORVASC) 5 MG tablet Take 1 tablet (5 mg total) by mouth every morning. 90 tablet 3   aspirin 81 MG chewable tablet Chew 81 mg by mouth at bedtime.     atorvastatin (LIPITOR) 40 MG tablet TAKE ONE TABLET BY MOUTH EVERY MORNING 90 tablet 2   Blood Glucose Monitoring Suppl (ONETOUCH VERIO) w/Device KIT Use to check blood sugars TID. Dx: E11.9 1 kit 0   carvedilol (COREG) 6.25 MG tablet TAKE ONE TABLET BY MOUTH TWICE DAILY 180 tablet 2   Cholecalciferol (VITAMIN D3) 50 MCG (2000 UT) capsule Take 1 capsule (2,000 Units total) by mouth daily.     Continuous Blood Gluc Receiver (FREESTYLE LIBRE 2 READER) DEVI Use with sensors to monitor sugar continuously 1 each 0   Continuous Blood Gluc Sensor (FREESTYLE LIBRE 2 SENSOR) MISC Apply sensor every 14 days to monitor sugar continously 2 each 5   cyanocobalamin (,VITAMIN B-12,) 1000 MCG/ML injection Inject 1 mL (1,000 mcg total) into the muscle every 14 (fourteen) days. 1 mL    cyclobenzaprine (FLEXERIL) 5 MG tablet Take 1 tablet (5 mg total) by mouth 3 (three) times daily as needed for muscle spasms (sedation caution). 30 tablet 1   diclofenac Sodium (VOLTAREN) 1 % GEL Apply 2-4 g topically 4 (four) times daily. 100 g 5   famotidine (PEPCID) 20 MG tablet TAKE ONE TABLET BY MOUTH EVERYDAY AT BEDTIME 90 tablet 0   glimepiride (AMARYL) 4 MG tablet TAKE ONE TABLET BY MOUTH TWICE DAILY 180 tablet 0   glucose blood (ONETOUCH VERIO) test strip USE TO check blood glucose THREE TIMES DAILY AS DIRECTED 300 strip 12   guaiFENesin-codeine (ROBITUSSIN AC) 100-10 MG/5ML syrup Take 5-10 mLs by mouth at bedtime as needed for cough. 180 mL 0   Insulin Pen Needle 32G X 4 MM MISC Use daily with insulin pen. 100 each 3   Insulin Syringe-Needle U-100 (INSULIN SYRINGE 1CC/31GX5/16") 31G X 5/16" 1 ML MISC Use with NPH insulin twice daily. 100 each 3   isosorbide mononitrate (IMDUR) 30 MG 24 hr tablet TAKE  ONE TABLET BY MOUTH EVERYDAY AT BEDTIME 90 tablet 2   Lancets (ONETOUCH ULTRASOFT) lancets Use to check blood sugars TID. Dx: E11.9 300 each 12   losartan (COZAAR) 100 MG tablet TAKE ONE TABLET BY MOUTH EVERYDAY AT BEDTIME 90 tablet 1   metFORMIN (GLUCOPHAGE) 500 MG tablet TAKE ONE TABLET BY MOUTH TWICE DAILY 180 tablet 2   NOVOLIN N FLEXPEN 100 UNIT/ML FlexPen Inject 20 Units into the skin in the morning and at bedtime. 15 mL 11   pantoprazole (PROTONIX) 40 MG tablet TAKE ONE TABLET BY MOUTH EVERY MORNING 90 tablet 0   Semaglutide, 1 MG/DOSE, 4 MG/3ML SOPN  Inject 1 mg as directed once a week. 3 mL 2   tamsulosin (FLOMAX) 0.4 MG CAPS capsule TAKE ONE CAPSULE BY MOUTH EVERY MORNING and TAKE ONE CAPSULE BY MOUTH EVERYDAY AT BEDTIME 60 capsule 2   nitroGLYCERIN (NITROSTAT) 0.4 MG SL tablet Place 1 tablet (0.4 mg total) under the tongue every 5 (five) minutes as needed for chest pain. (Patient not taking: Reported on 02/16/2022) 25 tablet 3   ondansetron (ZOFRAN) 4 MG tablet Take 4 mg by mouth every 8 (eight) hours as needed. (Patient not taking: Reported on 02/16/2022)     oxyCODONE-acetaminophen (PERCOCET/ROXICET) 5-325 MG tablet Take 1 tablet by mouth 4 (four) times daily. (Patient not taking: Reported on 02/16/2022)     azithromycin (ZITHROMAX) 250 MG tablet 2 tab po x 1 day then 1 tab po daily (Patient not taking: Reported on 02/16/2022) 6 tablet 0   furosemide (LASIX) 20 MG tablet Take 1 tablet (20 mg total) by mouth daily as needed for fluid or edema. (Patient not taking: Reported on 02/16/2022) 30 tablet 3   potassium chloride (KLOR-CON) 10 MEQ tablet Take 1 tablet (10 mEq total) by mouth daily as needed (with Lasix). (Patient not taking: Reported on 02/16/2022) 30 tablet 3   pregabalin (LYRICA) 25 MG capsule 1 tab in the AM and 2 in the PM. (Patient not taking: Reported on 02/16/2022)     No facility-administered medications prior to visit.     Review of Systems:   Constitutional:   No  weight  loss, night sweats,  Fevers, chills,  +fatigue, or  lassitude.  HEENT:   No headaches,  Difficulty swallowing,  Tooth/dental problems, or  Sore throat,                No sneezing, itching, ear ache, nasal congestion, post nasal drip,   CV:  No chest pain,  Orthopnea, PND, swelling in lower extremities, anasarca, dizziness, palpitations, syncope.   GI  No heartburn, indigestion, abdominal pain, nausea, vomiting, diarrhea, change in bowel habits, loss of appetite, bloody stools.   Resp:  No excess mucus, no productive cough,  No non-productive cough,  No coughing up of blood.  No change in color of mucus.  No wheezing.  No chest wall deformity  Skin: no rash or lesions.  GU: no dysuria, change in color of urine, no urgency or frequency.  No flank pain, no hematuria   MS: + joint pain    Physical Exam  BP 114/64 (BP Location: Right Arm, Patient Position: Sitting, Cuff Size: Normal)   Pulse 74   Temp 98.2 F (36.8 C) (Oral)   Ht 5' 10" (1.778 m)   Wt 195 lb 9.6 oz (88.7 kg)   SpO2 99%   BMI 28.07 kg/m   GEN: A/Ox3; pleasant , NAD, well nourished    HEENT:  [place]/AT,   NOSE-clear, THROAT-clear, no lesions, no postnasal drip or exudate noted. Class 3 MP airway   NECK:  Supple w/ fair ROM; no JVD; normal carotid impulses w/o bruits; no thyromegaly or nodules palpated; no lymphadenopathy.    RESP  Clear  P & A; w/o, wheezes/ rales/ or rhonchi. no accessory muscle use, no dullness to percussion  CARD:  RRR, no m/r/g, no peripheral edema, pulses intact, no cyanosis or clubbing.  GI:   Soft & nt; nml bowel sounds; no organomegaly or masses detected.   Musco: Warm bil, no deformities or joint swelling noted.   Neuro: alert, no focal deficits noted.  Skin: Warm, no lesions or rashes    Lab Results:    BNP No results found for: "BNP"    Imaging: No results found.  cyanocobalamin ((VITAMIN B-12)) injection 1,000 mcg     Date Action Dose Route User   01/13/2022 1556  Given 1,000 mcg Intramuscular (Right Deltoid) Beatriz Stallion P, CMA      cyanocobalamin ((VITAMIN B-12)) injection 1,000 mcg     Date Action Dose Route User   01/28/2022 1541 Given 1,000 mcg Intramuscular (Right Deltoid) Loreen Freud, CMA      cyanocobalamin ((VITAMIN B-12)) injection 1,000 mcg     Date Action Dose Route User   02/11/2022 1524 Given 1,000 mcg Intramuscular (Right Deltoid) Fiscal, Claiborne Billings, CMA           No data to display          No results found for: "NITRICOXIDE"      Assessment & Plan:   Sleep apnea OSA -longstanding sleep apnea with reported CPAP compliance.  CPAP download has been requested.  Patient says he cannot sleep without his CPAP.  Previous CPAP download last year showed excellent control and compliance will adjust CPAP pressure for comfort.  Patient has had some significant weight loss.  Change CPAP to 5 to 12 cm H2O.  We will check CPAP download now and in 1 month. Advised may use some saline nasal spray and gel to help with dryness   Plan  Patient Instructions  CPAP download now and 1 month.  Change CPAP pressure to 5 to 12cm H2O .  Continue on CPAP At bedtime   Keep up good work  Saline nasal spray Twice daily   Saline nasal gel  At bedtime   Do not drive if sleepy  Work on healthy weight .   Set up for PFT  Chest xray today  Follow up with Dr. 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  Unable to find records.  We will check chest x-ray today.  Previous LFTs were normal in 2021.  PFTs are pending  Plan  Patient Instructions  CPAP download now and 1 month.  Change CPAP pressure to 5 to 12cm H2O .  Continue on CPAP At bedtime   Keep up good work  Saline nasal spray Twice daily   Saline nasal gel  At bedtime   Do not drive if sleepy  Work on healthy weight .   Set up for PFT  Chest xray today  Follow up with Dr. Ander Slade in  6 months or Xan Ingraham NP and As needed         Rexene Edison, NP 02/16/2022

## 2022-02-19 ENCOUNTER — Telehealth: Payer: Self-pay | Admitting: Pharmacist

## 2022-02-19 ENCOUNTER — Telehealth: Payer: Medicare HMO

## 2022-02-19 DIAGNOSIS — G4733 Obstructive sleep apnea (adult) (pediatric): Secondary | ICD-10-CM | POA: Diagnosis not present

## 2022-02-19 NOTE — Progress Notes (Deleted)
Chronic Care Management Pharmacy Note  Name:  Tony Manning   MRN :  [id number] DOB:  12/30/1960  Summary: CCM F/U visit -Pt recently ran out of Ozempic, he was taking 1/2 dose of 1 mg pen to make it last longer and took last dose last week. His income increased last year so he thought he would not qualify for PAP this year, but after discussion he should still qualify -Pt reports fasting BG has been low 200s lately. Discussed rationing Ozempic and recent URI may be contributing to high BG. He also has "backed off on insulin" since Ozempic was previously controlling BG very well  Recommendations:  -Renew Novo Cares PAP for Ozempic 1 mg weekly - pt will come to office to sign -Advised pt to take Insulin as prescribed until able to renew Ozempic supply  Plan:  -[place] will call patient 1 month for adherence packs/check on PAP -Pharmacist follow up televisit scheduled for 3 months -Pt is due for PCP f/u (4 months recommended 06/2021). Pt unable to make appt today but advised to call office to schedule    Subjective: Tony Manning is an 61 y.o. year old male who is a primary patient of [name], Elveria Rising, MD.  The CCM team was consulted for assistance with disease management and care coordination needs.    Engaged with patient by telephone for follow up visit in response to provider referral for pharmacy case management and/or care coordination services.   Consent to Services:  The patient was given information about Chronic Care Management services, agreed to services, and gave verbal consent prior to initiation of services.  Please see initial visit note for detailed documentation.   Patient Care Team: Tonia Ghent, MD as PCP - General (Family Medicine) Adrian Prows, MD as Consulting Physician (Cardiology) Charlton Haws, [facility] as Pharmacist (Pharmacist)  Recent office visits: 02/05/22 Dr Diona Browner VV: acute cough - rx'd zpak, codeine cough syrup. 01/09/22  Dr [name] OV: f/u - SOB. Take lasix for weight > 208#. Can try Lyrica 1 AM, 2 PM.  11/19/21 NP Eugenia Pancoast VV: covid-19. Rx'd Augmentin. 11/14/20 NP Eugenia Pancoast VV: covid-19. Rx'd Lagevrio. 09/05/2021 - [name], MD - Patient presented for Covid positive. Start: Lagevrio. Stop due to patient not taking: oxycodone 06/09/2021 - [name], MD - Patient presented for [facility]. Labs: CMP, A1c, CBC, Lipid, PSA and Vit B. Foot exam completed. Ran out of Ozempic, will restart. A1c stable 7.2%, Kidney function declined, refer to renal clinic. Administered: cyanocobalamin ((VITAMIN B-12)) injection 1,000 mcg. No medication changes. Follow up 3 months.    Recent consult visits:  02/16/22 NP Tammy Parrett (pulmonary): f/u Sarcoid. D/c pregabalin. D/c fuorsemide, Kcl. Ordered PFTs.  01/05/22 PA Lawerance Cruel (cardiology): SOB/edema. Rx'd furosemide 20 mg and Kcl 10 mEq. Reduced amlodipine to 5 mg.  10/02/21 Dr Vikki Ports (psychology): chronic pain  - stable for spinal cord stimulator trial   Hospital visits:  None in previous 6 months  Objective:  Lab Results  Component Value Date   CREATININE 1.56 (H) 01/09/2022   BUN 19 01/09/2022   GFR 47.53 (L) 01/09/2022   GFRNONAA 54 (L) 11/12/2020   GFRAA 42 (L) 05/05/2019   NA 140 01/09/2022   K 4.9 01/09/2022   CALCIUM 9.6 01/09/2022   CO2 29 01/09/2022   GLUCOSE 156 (H) 01/09/2022    Lab Results  Component Value Date/Time   HGBA1C 7.2 (H) 12/23/2021 11:47 AM   HGBA1C 7.2 (H)  06/09/2021 09:23 AM   GFR 47.53 (L) 01/09/2022 12:05 PM   GFR 60.28 12/23/2021 11:47 AM   MICROALBUR 1.5 08/15/2012 09:05 AM    Last diabetic Eye exam:  Lab Results  Component Value Date/Time   HMDIABEYEEXA Retinopathy (A) 01/13/2021 12:00 AM    Last diabetic Foot exam:  10/25/20 normal   Lab Results  Component Value Date   CHOL 112 06/09/2021   HDL 40.20 06/09/2021   LDLCALC 57 06/09/2021   TRIG 73.0 06/09/2021   CHOLHDL 3 06/09/2021        Latest Ref Rng & Units 06/09/2021    9:23 AM 01/27/2021   12:40 PM 03/05/2020    2:46 PM  Hepatic Function  Total Protein 6.0 - 8.3 g/dL 6.8  7.5  7.5   Albumin 3.5 - 5.2 g/dL 4.3  4.8  4.6   AST 0 - 37 U/L _0 ALT 0 - 53 U/L _1 Alk Phosphatase 39 - 117 U/L 90  78  67   Total Bilirubin 0.2 - 1.2 mg/dL 0.5  0.6  0.5   Bilirubin, Direct 0.0 - 0.3 mg/dL  0.1      Lab Results  Component Value Date/Time   TSH 1.34 01/27/2021 12:40 PM   TSH 2.22 03/21/2018 12:05 PM       Latest Ref Rng & Units 12/23/2021   11:47 AM 06/09/2021    9:23 AM 01/27/2021   12:40 PM  CBC  WBC 4.0 - 10.5 K/uL 5.9  7.2  7.9   Hemoglobin 13.0 - 17.0 g/dL 13.4  13.7  14.0   Hematocrit 39.0 - 52.0 % 39.6  40.9  40.6   Platelets 150.0 - 400.0 K/uL 193.0  198.0  223.0     Lab Results  Component Value Date/Time   VD25OH 23.11 (L) 12/23/2021 11:47 AM   VD25OH 19.37 (L) 03/21/2018 12:05 PM    Clinical ASCVD: Yes  The ASCVD Risk score (Arnett DK, et al., 2019) failed to calculate for the following reasons:   The patient has a prior MI or stroke diagnosis       12/23/2021   11:03 AM 09/20/2020    2:06 PM 04/21/2018    9:57 AM  Depression screen PHQ 2/9  Decreased Interest 0 0 0  Down, Depressed, Hopeless 0 0 0  PHQ - 2 Score 0 0 0  Altered sleeping  0   Tired, decreased energy  0   Change in appetite  0   Feeling bad or failure about yourself   0   Trouble concentrating  0   Moving slowly or fidgety/restless  0   Suicidal thoughts  0   PHQ-9 Score  0   Difficult doing work/chores  Not difficult at all     Social History   Tobacco Use  Smoking Status Never  Smokeless Tobacco Former   Types: Chew   Quit date: 02/06/2016   BP Readings from Last 3 Encounters:  02/16/22 114/64  02/05/22 124/72  01/09/22 120/60   Pulse Readings from Last 3 Encounters:  02/16/22 74  01/09/22 60  01/05/22 60   Wt Readings from Last 3 Encounters:  02/16/22 195 lb 9.6 oz (88.7 kg)  02/05/22 190 lb  (86.2 kg)  01/09/22 208 lb (94.3 kg)   BMI Readings from Last 3 Encounters:  02/16/22 28.07 kg/m  02/05/22 27.26 kg/m  01/09/22 29.84 kg/m    Assessment/Interventions: Review of patient past  medical history, allergies, medications, health status, including review of consultants reports, laboratory and other test data, was performed as part of comprehensive evaluation and provision of chronic care management services.   SDOH:  (Social Determinants of Health) assessments and interventions performed: Yes      CCM Care Plan  Allergies  Allergen Reactions   Ambien [Zolpidem] Other (See Comments)    Parasomnias, sleep walking   Lisinopril Cough   Gabapentin Other (See Comments)    REACTION: lack of effect for pain   Tramadol Diarrhea    Medications Reviewed Today     Reviewed by Nolon Stalls, Kimber Relic, RN (Registered Nurse) on 02/16/22 at 0857  Med List Status: <None>   Medication Order Taking? Sig Documenting Provider Last Dose Status Informant  amLODipine (NORVASC) 5 MG tablet [id number] Yes Take 1 tablet (5 mg total) by mouth every morning. Cantwell, Celeste C, PA-C Taking Active   aspirin 81 MG chewable tablet [id number] Yes Chew 81 mg by mouth at bedtime. [provider] Taking Active Self  atorvastatin (LIPITOR) 40 MG tablet [id number] Yes TAKE ONE TABLET BY MOUTH EVERY MORNING Tonia Ghent, MD Taking Active   Blood Glucose Monitoring Suppl [facility] VERIO) w/Device KIT [id number] Yes Use to check blood sugars TID. Dx: E11.9 Tonia Ghent, MD Taking Active   carvedilol (COREG) 6.25 MG tablet [id number] Yes TAKE ONE TABLET BY MOUTH TWICE DAILY Adrian Prows, MD Taking Active   Cholecalciferol (VITAMIN D3) 50 MCG (2000 UT) capsule [id number] Yes Take 1 capsule (2,000 Units total) by mouth daily. Tonia Ghent, MD Taking Active   Continuous Blood Gluc Receiver (FREESTYLE LIBRE 2 READER) [place]VI [id number] Yes Use with sensors to monitor sugar continuously Tonia Ghent, MD Taking Active   Continuous Blood Gluc Sensor (FREESTYLE LIBRE 2 SENSOR) [place] [id number] Yes Apply sensor every 14 days to monitor sugar continously Tonia Ghent, MD Taking Active   cyanocobalamin (,VITAMIN B-12,) 1000 MCG/ML injection [id number] Yes Inject 1 mL (1,000 mcg total) into the muscle every 14 (fourteen) days. Tonia Ghent, MD Taking Active   cyclobenzaprine (FLEXERIL) 5 MG tablet [id number] Yes Take 1 tablet (5 mg total) by mouth 3 (three) times daily as needed for muscle spasms (sedation caution). Tonia Ghent, MD Taking Active   diclofenac Sodium (VOLTAREN) 1 % GEL [id number] Yes Apply 2-4 g topically 4 (four) times daily. Tonia Ghent, MD Taking Active   famotidine (PEPCID) 20 MG tablet [id number] Yes TAKE ONE TABLET BY MOUTH EVERYDAY AT BEDTIME Parrett, Fonnie Mu, NP Taking Active   glimepiride (AMARYL) 4 MG tablet [id number] Yes TAKE ONE TABLET BY MOUTH TWICE DAILY Tonia Ghent, MD Taking Active   glucose blood [facility] VERIO) test strip [id number] Yes USE TO check blood glucose THREE TIMES DAILY AS DIRECTED Tonia Ghent, MD Taking Active   guaiFENesin-codeine [facility]) 100-10 MG/5ML syrup [id number] Yes Take 5-10 mLs by mouth at bedtime as needed for cough. Jinny Sanders, MD Taking Active   Insulin Pen Needle 32G X 4 MM MISC [id number] Yes Use daily with insulin pen. Tonia Ghent, MD Taking Active Self  Insulin Syringe-Needle U-100 (INSULIN SYRINGE 1CC/31GX5/16") 31G X 5/16" 1 ML MISC [id number] Yes Use with NPH insulin twice daily. Tonia Ghent, MD Taking Active   isosorbide mononitrate (IMDUR) 30 MG 24 hr tablet [id number] Yes TAKE ONE TABLET BY MOUTH EVERYDAY AT BEDTIME Adrian Prows, MD Taking Active   Lancets [facility] ULTRASOFT) lancets [id number] Yes Use to check  blood sugars TID. Dx: E11.9 Tonia Ghent, MD Taking Active   losartan (COZAAR) 100 MG tablet [id number] Yes TAKE ONE TABLET BY MOUTH EVERYDAY AT BEDTIME Adrian Prows, MD Taking Active    metFORMIN (GLUCOPHAGE) 500 MG tablet [id number] Yes TAKE ONE TABLET BY MOUTH TWICE DAILY Tonia Ghent, MD Taking Active   nitroGLYCERIN (NITROSTAT) 0.4 MG SL tablet [id number] No Place 1 tablet (0.4 mg total) under the tongue every 5 (five) minutes as needed for chest pain.  Patient not taking: Reported on 02/16/2022   Miquel Dunn, NP Not Taking Active   NOVOLIN N FLEXPEN 100 UNIT/ML FlexPen [id number] Yes Inject 20 Units into the skin in the morning and at bedtime. Tonia Ghent, MD Taking Active   ondansetron [facility]) 4 MG tablet [id number] No Take 4 mg by mouth every 8 (eight) hours as needed.  Patient not taking: Reported on 02/16/2022   [provider] Not Taking Active   oxyCODONE-acetaminophen (PERCOCET/ROXICET) 5-325 MG tablet [id number] No Take 1 tablet by mouth 4 (four) times daily.  Patient not taking: Reported on 02/16/2022   [provider] Not Taking Active   pantoprazole (PROTONIX) 40 MG tablet [id number] Yes TAKE ONE TABLET BY MOUTH EVERY MORNING Tonia Ghent, MD Taking Active   Semaglutide, 1 MG/DOSE, 4 MG/3ML SOPN [id number] Yes Inject 1 mg as directed once a week. Tonia Ghent, MD Taking Active   tamsulosin [facility]) 0.4 MG CAPS capsule [id number] Yes TAKE ONE CAPSULE BY MOUTH EVERY MORNING and TAKE ONE CAPSULE BY MOUTH EVERYDAY AT BEDTIME Tonia Ghent, MD Taking Active             Patient Active Problem List   Diagnosis Date Noted   Acute cough 02/05/2022   Near syncope 12/25/2021   COVID-19 11/14/2021   Advance care planning 06/13/2021   Insomnia 06/13/2021   B12 deficiency 01/30/2021   Fatigue 01/29/2021   Abdominal discomfort 01/05/2021   SOB (shortness of breath) 11/12/2020   Sarcoidosis 11/12/2020   H/O colonoscopy 10/27/2020   Nocturia 05/03/2020   Right sided weakness 03/06/2020   Low back pain 01/29/2020   Facial weakness 11/16/2019   Hematuria 03/22/2018   Renal stones 03/22/2018   Vitamin D deficiency,  unspecified 10/04/2017   Chronic neck pain (Primary Area of Pain) (B) (L>R) 09/21/2017   Chronic upper extremity pain (Secondary Area of Pain) (Bilateral) (L>R) 09/21/2017   Chronic left shoulder pain (Tertiary Area of Pain) 09/21/2017   Chronic low back pain (Fourth Area of Pain) (B) (R>L) 09/21/2017   Chronic pain of lower extremity (B) (R>L) 09/21/2017   Other long term (current) drug therapy 09/21/2017   Other specified health status 09/21/2017   Other chronic pain 08/22/2017   Other fatigue 05/10/2017   Radiculopathy 04/07/2017   Angina pectoris ([place]) 10/15/2016   Cervical radiculitis 07/23/2016   Dysphagia 06/12/2016   Sciatica 02/06/2016   Plantar fasciitis 01/16/2016   Right sided sciatica 05/16/2015   Hand pain 04/05/2015   Premature ejaculation 09/17/2014   Heel pain 09/17/2014   NSTEMI (non-ST elevated myocardial infarction) ([place]) 06/13/2014   Atypical chest pain 06/06/2014   Memory change 05/24/2014   Benign paroxysmal positional vertigo 07/13/2013   Orthostasis 07/13/2013   Migraine, unspecified, without mention of intractable migraine without mention of status migrainosus 04/03/2013   Pain in joint, ankle and foot 10/19/2012   Irritable mood 01/18/2012   Neck pain 12/03/2010   Hearing loss 12/03/2010   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[place])  10/20/2010   HYPERCHOLESTEROLEMIA 10/20/2010   DEPRESSION 10/20/2010   Osteoarthritis 10/20/2010   ARTHRITIS 10/20/2010   NEPHROLITHIASIS, HX OF 10/20/2010   ESSENTIAL HYPERTENSION, BENIGN 09/30/2010   CORONARY ATHEROSCLEROSIS NATIVE CORONARY ARTERY 09/30/2010   Sleep apnea 09/30/2010   CHEST PAIN UNSPECIFIED 09/30/2010   PERCUTANEOUS TRANSLUMINAL CORONARY ANGIOPLASTY, HX OF 09/30/2010    Immunization History  Administered Date(s) Administered   Influenza Inj Mdck Quad Pf 06/02/2018   Influenza Whole 09/17/2010   Influenza,inj,Quad PF,6+ Mos 06/08/2013, 05/22/2014, 05/12/2016, 05/08/2019, 05/27/2020    Influenza-Unspecified 06/08/2013, 05/22/2014, 06/08/2015, 05/12/2016, 05/21/2017, 06/02/2018, 05/09/2019, 05/21/2021   PFIZER(Purple Top)SARS-COV-2 Vaccination 11/27/2019, 12/25/2019, 08/07/2020, 05/21/2021   Pneumococcal Polysaccharide-23 09/08/2007, 02/16/2013   Td 09/07/2009    Conditions to be addressed/monitored:  Hypertension, Hyperlipidemia, Diabetes, Coronary Artery Disease, and Chronic Kidney Disease  There are no care plans that you recently modified to display for this patient.    Compliance/Adherence/Medication fill history: Care Gaps: Colonoscopy (due 03/23/2018)  Star-Rating Drugs: Atorvastatin - PDC 100% Glimepiride - PDC 100% Metformin - PDC 100% Losartan - PDC 100%  Medication Assistance:  [employer] 2022. Pt decided not to pursue PAP 2023 due to income change.  Patient's preferred pharmacy is: [facility] - [place], [place] - [address] [place] [address] Phone: [phone] Fax: [phone]  No concerns with pill packs.  Packs, 30 DS: Aspirin 81 mg - 1 tablet daily (1 breakfast)  Amlodipine 10 mg- 1 tablet daily (1 breakfast) Atorvastatin 40 mg- 1 tablet daily (1 breakfast) Carvedilol 6.25 mg- 1 tablet twice daily (1 breakfast, 1 evening meal) Famotidine 20 mg- 1 tablet daily (1 bedtime) Glimepiride 4 mg- 1 tablet twice daily (1 breakfast, 1 evening meal) Isosorbide mononitrate 30 mg- 1 tablet daily (1 bedtime) Losartan 100 mg- 1 tablet daily- (1 bedtime) Metformin 1000 mg- 1/2 tablet twice daily (1 breakfast, 1 evening meal) Pantoprazole 40 mg- 1 tablet daily (1 breakfast) Tamsulosin 0.4 mg- 2 capsule daily (1 breakfast, 1 bedtime)    Care Plan and Follow Up Patient Decision:  Patient agrees to Care Plan and Follow-up.  Follow Up Plan: Telephone follow up appointment with care management team member scheduled for: 3 months  Charlene Brooke, PharmD, BCACP Clinical  Pharmacist [facility] at [facility] [phone]    Current Barriers:  Uncontrolled diabetes  Pharmacist Clinical Goal(s):  Patient will adhere to plan to optimize therapeutic regimen for diabetes as evidenced by report of adherence to recommended medication management changes through collaboration with PharmD and provider.   Interventions: 1:1 collaboration with Tonia Ghent, MD regarding development and update of comprehensive plan of care as evidenced by provider attestation and co-signature Inter-disciplinary care team collaboration (see longitudinal plan of care) Comprehensive medication review performed; medication list updated in electronic medical record  Hypertension / CKD Stage 3b (BP goal <130/80) -Controlled - BP at goal in office  -Current treatment: Amodipine 5 mg daily - Appropriate, Effective, Safe, Accessible Carvedilol 6.25 mg BID -Appropriate, Effective, Safe, Accessible Isosorbide MN 30 mg HS -Appropriate, Effective, Safe, Accessible Losartan 100 mg daily -Appropriate, Effective, Safe, Accessible -Medications previously tried: n/a  -Educated on BP goals and benefits of medications for prevention of heart attack, stroke and kidney damage; -Counseled to monitor BP at home periodically, document, and provide log at future appointments -Recommended to continue current medication  Hyperlipidemia / CAD (LDL goal < 70) -Controlled - LDL 59 -Hx CAD. NSTEMI 06/2014 -Current treatment: Atorvastatin 40 mg  daily -Appropriate, Effective, Safe, Accessible Aspirin 81 mg daily -Appropriate, Effective, Safe, Accessible Isosorbide MN 30 mg daily -Appropriate, Effective, Safe, Accessible Nitroglycerin 0.4 mg SL prn -Appropriate, Effective, Safe, Accessible -Medications previously tried: none  -Recommended to continue current medication  Diabetes (A1c goal <7%) -Not ideally controlled - A1c 7.2% (06/2021); pt is back on Ozempic, he was denied Novo Cares PAP  due to income too high but reports it is affordable at the pharmacy now. -Reviewed AGP report: 02/06/22 to 02/19/22  Time in range (70-180): 56% (goal > 70%)  High (181-250): 37% (4% very high > 250)  Low (< 70): 3% (goal < 4%)  GMI: 7.2%; Average glucose: 164 -Current medications: Glimepiride 4 mg BID - Appropriate, Query Effective, Novolin N - 20 units BID -Appropriate, Query Effective, Metformin 500 mg BID -Appropriate, Query Effective, Ozempic 1 mg weekly -Appropriate, Effective, Safe, Accessible -Medications previously tried: none reported -Recommend to continue current medication  Chronic pain (Goal: manage symptoms) -{US controlled/uncontrolled:25276} -cervical radiculitis, sciatica, osteoarthritis. Injections from other did not help, going to pain mgmt. -Current treatment  Oxycodone-APAP 5-325 mg Cyclobenzaprine 5 mg TID prn -Medications previously tried: ***  -{CCMPHARMDINTERVENTION:25122}  Health Maintenance -Vaccine gaps: Shingrix   Patient Goals/Self-Care Activities Patient will:  - take medications as prescribed as evidenced by patient report and record review focus on medication adherence by routine check glucose daily, document, and provide at future appointments collaborate with provider on medication access solutions (Ozempic)

## 2022-02-19 NOTE — Telephone Encounter (Signed)
  Chronic Care Management   Outreach Note  02/19/2022 Name: Tony Manning MRN: [id number] DOB: 29-Jun-1961  Referred by: Tonia Ghent, MD  Patient had a phone appointment scheduled with clinical pharmacist today.  An unsuccessful telephone outreach was attempted today. The patient was referred to the pharmacist for assistance with medications, care management and care coordination.   Patient will NOT be penalized in any way for missing a CCM appointment. The no-show fee does not apply.  If possible, a message was left to return call to: [phone] or to [facility].  Charlene Brooke, PharmD, BCACP Clinical Pharmacist [facility] at [facility] [phone]

## 2022-02-20 NOTE — Progress Notes (Signed)
Called and spoke with patient, advised of results/recommendations per Tammy Parrett NP.  He verbalized understanding.  Nothing further needed.

## 2022-02-23 ENCOUNTER — Other Ambulatory Visit: Payer: Self-pay | Admitting: Cardiology

## 2022-02-24 ENCOUNTER — Telehealth: Payer: Self-pay

## 2022-02-24 NOTE — Progress Notes (Unsigned)
Chronic Care Management Pharmacy Assistant   Name: Tony Manning  MRN: [id number] DOB: 10/25/1960  Reason for Encounter: CCM (Medication Adherence and Delivery Coordination)    Recent office visits:  02/11/22 B12 injection 02/05/22 [name], MD: Acute Cough Start: Azithromycin 250 mg Start: Guaifenesin-Codeine 100-10 mg. 01/28/22 B12 injection  Recent consult visits:  02/16/22 [name], NP (Pulmonology): Sarcoidosis DG Chest and Pulmonary Function Test Stop all due to patient not taking: Azithromycin, Furosemide, Potassium and Pregabalin. FU 6 months 01/28/22 [name] [facility]): Radiculopathy of cervical region  Hospital visits:  None in previous 6 months  Medications: Outpatient Encounter Medications as of 02/24/2022  Medication Sig   amLODipine (NORVASC) 5 MG tablet Take 1 tablet (5 mg total) by mouth every morning.   aspirin 81 MG chewable tablet Chew 81 mg by mouth at bedtime.   atorvastatin (LIPITOR) 40 MG tablet TAKE ONE TABLET BY MOUTH EVERY MORNING   Blood Glucose Monitoring Suppl (ONETOUCH VERIO) w/Device KIT Use to check blood sugars TID. Dx: E11.9   carvedilol (COREG) 6.25 MG tablet TAKE ONE TABLET BY MOUTH TWICE DAILY   Cholecalciferol (VITAMIN D3) 50 MCG (2000 UT) capsule Take 1 capsule (2,000 Units total) by mouth daily.   Continuous Blood Gluc Receiver (FREESTYLE LIBRE 2 READER) DEVI Use with sensors to monitor sugar continuously   Continuous Blood Gluc Sensor (FREESTYLE LIBRE 2 SENSOR) MISC Apply sensor every 14 days to monitor sugar continously   cyanocobalamin (,VITAMIN B-12,) 1000 MCG/ML injection Inject 1 mL (1,000 mcg total) into the muscle every 14 (fourteen) days.   cyclobenzaprine (FLEXERIL) 5 MG tablet Take 1 tablet (5 mg total) by mouth 3 (three) times daily as needed for muscle spasms (sedation caution).   diclofenac Sodium (VOLTAREN) 1 % GEL Apply 2-4 g topically 4 (four) times daily.   famotidine (PEPCID) 20 MG tablet TAKE ONE  TABLET BY MOUTH EVERYDAY AT BEDTIME   glimepiride (AMARYL) 4 MG tablet TAKE ONE TABLET BY MOUTH TWICE DAILY   glucose blood (ONETOUCH VERIO) test strip USE TO check blood glucose THREE TIMES DAILY AS DIRECTED   guaiFENesin-codeine (ROBITUSSIN AC) 100-10 MG/5ML syrup Take 5-10 mLs by mouth at bedtime as needed for cough.   Insulin Pen Needle 32G X 4 MM MISC Use daily with insulin pen.   Insulin Syringe-Needle U-100 (INSULIN SYRINGE 1CC/31GX5/16") 31G X 5/16" 1 ML MISC Use with NPH insulin twice daily.   isosorbide mononitrate (IMDUR) 30 MG 24 hr tablet TAKE ONE TABLET BY MOUTH EVERYDAY AT BEDTIME   Lancets (ONETOUCH ULTRASOFT) lancets Use to check blood sugars TID. Dx: E11.9   losartan (COZAAR) 100 MG tablet TAKE ONE TABLET BY MOUTH EVERYDAY AT BEDTIME   metFORMIN (GLUCOPHAGE) 500 MG tablet TAKE ONE TABLET BY MOUTH TWICE DAILY   nitroGLYCERIN (NITROSTAT) 0.4 MG SL tablet Place 1 tablet (0.4 mg total) under the tongue every 5 (five) minutes as needed for chest pain. (Patient not taking: Reported on 02/16/2022)   NOVOLIN N FLEXPEN 100 UNIT/ML FlexPen Inject 20 Units into the skin in the morning and at bedtime.   ondansetron (ZOFRAN) 4 MG tablet Take 4 mg by mouth every 8 (eight) hours as needed. (Patient not taking: Reported on 02/16/2022)   oxyCODONE-acetaminophen (PERCOCET/ROXICET) 5-325 MG tablet Take 1 tablet by mouth 4 (four) times daily. (Patient not taking: Reported on 02/16/2022)   pantoprazole (PROTONIX) 40 MG tablet TAKE ONE TABLET BY MOUTH EVERY MORNING   Semaglutide, 1 MG/DOSE, 4 MG/3ML SOPN Inject 1 mg as  directed once a week.   tamsulosin (FLOMAX) 0.4 MG CAPS capsule TAKE ONE CAPSULE BY MOUTH EVERY MORNING and TAKE ONE CAPSULE BY MOUTH EVERYDAY AT BEDTIME   No facility-administered encounter medications on file as of 02/24/2022.   BP Readings from Last 3 Encounters:  02/16/22 114/64  02/05/22 124/72  01/09/22 120/60    Lab Results  Component Value Date   HGBA1C 7.2 (H) 12/23/2021     Recent OV, Consult or Hospital visit:  Recent medication changes indicated:  Start: Azithromycin 250 mg  Start: Guaifenesin-Codeine 100-10 mg Stop all due to patient not taking: Azithromycin, Furosemide, Potassium and Pregabalin  Last adherence delivery date: 02/05/2022      Patient is due for next adherence delivery on:  03/06/2022  {Med Review:25223}  This delivery to include: Adherence Packaging  30 Days  Packs: Amlodipine 10mg - 1 tablet daily (1 breakfast) Aspirin 81mg - 1 tablet daily (breakfast) Metformin 500mg  1 tablet twice daily (1 tablet  breakfast, 1 tablet evening meal) Carvedilol 6.25mg - 1 tablet twice daily (1 breakfast, 1 evening meal) Isosorbide 30mg - 1 tablet daily (1 bedtime) Tamsulosin 0.4mg  2 capsule daily (1 breakfast, 1 bedtime)   Losartan 100 mg- 1 tablet daily- (1 bedtime) Atorvastatin 40mg - 1 tablet daily (1 breakfast) Glimepiride 4mg - 1 tablet twice daily (1 breakfast, 1 evening meal) Pantoprazole 40 mg- 1 tablet daily (1 breakfast) Famotidine 20mg - 1 tablet daily (1 bedtime)   Vial medications: Ozempic - 1 mg weekly   Is he taking Potassium?  Patient declined:  Novolin N Flexpen 100 unit/mL (3 mL) - Inject 16 units twice daily (uses 3 days per week on average)    Patient no longer using:  Onetouch Verio test strips - PRN - still has 100 test strips. - No longer uses due to continues blood glucose meter.  Lyrica - patient stopped taking due to swelling.  Furosemide - no longer taking   Any concerns about your medications? No   How often do you forget or accidentally miss a dose? Occasionally   Do you use a pillbox? No   Is patient in packaging Yes  If yes  What is the date on your next pill pack?  Any concerns or issues with your packaging?  Refills requested from providers include: Losartan 100mg - 1 tablet daily- (1 bedtime)  {Delivery OACZ:66063} 06/30  Recent blood pressure readings are as follows:***   Recent blood glucose  readings are as follows:***  Annual wellness visit in last year? No Most Recent BP reading: 114/64 on 02/16/2022  If Diabetic: Most recent A1C reading: 7.2 on 12/23/2021 Last eye exam / retinopathy screening: 01/13/2021 Last diabetic foot exam: Up To Date  Charlene Brooke, CPP notified  Marijean Niemann, [place] Pharmacy Assistant [phone]

## 2022-02-25 ENCOUNTER — Ambulatory Visit: Payer: Medicare HMO | Admitting: Pharmacist

## 2022-02-25 ENCOUNTER — Telehealth: Payer: Self-pay | Admitting: Pharmacist

## 2022-02-25 ENCOUNTER — Ambulatory Visit (INDEPENDENT_AMBULATORY_CARE_PROVIDER_SITE_OTHER): Payer: Medicare HMO

## 2022-02-25 DIAGNOSIS — Z794 Long term (current) use of insulin: Secondary | ICD-10-CM

## 2022-02-25 DIAGNOSIS — E538 Deficiency of other specified B group vitamins: Secondary | ICD-10-CM | POA: Diagnosis not present

## 2022-02-25 DIAGNOSIS — E7800 Pure hypercholesterolemia, unspecified: Secondary | ICD-10-CM

## 2022-02-25 DIAGNOSIS — N189 Chronic kidney disease, unspecified: Secondary | ICD-10-CM

## 2022-02-25 DIAGNOSIS — I10 Essential (primary) hypertension: Secondary | ICD-10-CM

## 2022-02-25 MED ORDER — TRESIBA FLEXTOUCH 100 UNIT/ML ~~LOC~~ SOPN: PEN_INJECTOR | SUBCUTANEOUS | 1 refills | Status: DC

## 2022-02-25 MED ORDER — CYANOCOBALAMIN 1000 MCG/ML IJ SOLN
1000.0000 ug | Freq: Once | INTRAMUSCULAR | Status: AC
  Administered 2022-02-25: 1000 ug via INTRAMUSCULAR

## 2022-02-25 NOTE — Addendum Note (Signed)
Addended by: Tonia Ghent on: 02/25/2022 02:43 PM   Modules accepted: Orders

## 2022-02-25 NOTE — Patient Instructions (Signed)
Visit Information  Phone number for Pharmacist: [phone]   Goals Addressed   None     Care Plan : [place]  Updates made by Charlton Haws, RPH since 02/25/2022 12:00 AM     Problem: Hypertension, Hyperlipidemia, Diabetes, Coronary Artery Disease, and Chronic Kidney Disease   Priority: High     Long-Range Goal: Disease mgmt   Start Date: 11/27/2020  Expected End Date: 11/22/2022  This Visit's Progress: On track  Recent Progress: On track  Priority: High  Note:   Current Barriers:  Uncontrolled diabetes  Pharmacist Clinical Goal(s):  Patient will adhere to plan to optimize therapeutic regimen for diabetes as evidenced by report of adherence to recommended medication management changes through collaboration with PharmD and provider.   Interventions: 1:1 collaboration with Tonia Ghent, MD regarding development and update of comprehensive plan of care as evidenced by provider attestation and co-signature Inter-disciplinary care team collaboration (see longitudinal plan of care) Comprehensive medication review performed; medication list updated in electronic medical record  Hypertension / CKD Stage 3b (BP goal <130/80) -Controlled - BP at goal in office  -Current treatment: Amodipine 5 mg daily - Appropriate, Effective, Safe, Accessible Carvedilol 6.25 mg BID -Appropriate, Effective, Safe, Accessible Isosorbide MN 30 mg HS -Appropriate, Effective, Safe, Accessible Losartan 100 mg daily -Appropriate, Effective, Safe, Accessible -Medications previously tried: n/a  -Educated on BP goals and benefits of medications for prevention of heart attack, stroke and kidney damage; -Counseled to monitor BP at home periodically, document, and provide log at future appointments -Recommended to continue current medication  Hyperlipidemia / CAD (LDL goal < 70) -Controlled - LDL 59 -Hx CAD. NSTEMI 06/2014 -Current treatment: Atorvastatin 40 mg daily -Appropriate,  Effective, Safe, Accessible Aspirin 81 mg daily -Appropriate, Effective, Safe, Accessible Isosorbide MN 30 mg daily -Appropriate, Effective, Safe, Accessible Nitroglycerin 0.4 mg SL prn -Appropriate, Effective, Safe, Accessible -Medications previously tried: none  -Recommended to continue current medication  Diabetes (A1c goal <7%) -Not ideally controlled - A1c 7.2% (06/2021); pt reports he typically takes Novolin only once per day in AM and adjusts dose based on fasting BG, typically 10-15 units but sometimes up to 20 units. He reports he eats 1 meal/day typically late in the day (5-7 pm) and drinks a Cheerwine in the morning "to get going"; reviewed AGP report (below) - BG is elevated 9am-3pm likely due to AM Cheerwine, advised pt to switch to diet or other sugar-free alternative but pt said he would "rather tear his throat out" than stop his Cheerwine or switch to diet -Reviewed AGP report: 02/12/22 - 02/25/22  Time in range (70-180): 62% (goal > 70%)  High (181-250): 38% (3% very high > 250)  Low (< 70): 0% (goal < 4%)  GMI: 7.2%; Average glucose: 167  -Current medications: Glimepiride 4 mg BID - Appropriate, Query Effective, Novolin N - 20 units BID -Appropriate, Query Effective, Metformin 500 mg BID -Appropriate, Query Effective, Ozempic 1 mg weekly -Appropriate, Effective, Safe, Accessible -Medications previously tried: none reported -Discussed optimizing insulin - he pays $47 for Novolin and can now get basal insulin for $35/month with new Medicare laws, reviewed benefits of 24+ hr basal insulin and pt agreed to change -Recommend to switch Novolin to [place] 10 units (conservative dose change since pt is not typically taking Novolin BID); plant to titrate [place] by 1 units to target fasting BG < 130  Chronic Kidney Disease Stage 3b  -All medications assessed for renal dosing and appropriateness in chronic kidney  disease. -Recommended to continue current medication  Chronic pain  (Goal: manage symptoms) -Controlled -cervical radiculitis, sciatica, osteoarthritis. Injections from other did not help, going to pain mgmt. -Current treatment  Oxycodone-APAP 5-325 mg Cyclobenzaprine 5 mg TID prn -Medications previously tried: n/a  -Recommended to continue current medication  Health Maintenance -Vaccine gaps: Shingrix   Patient Goals/Self-Care Activities Patient will:  - take medications as prescribed as evidenced by patient report and record review focus on medication adherence by routine check glucose daily, document, and provide at future appointments collaborate with provider on medication access solutions (Ozempic)      Patient verbalizes understanding of instructions and care plan provided today and agrees to view in [place]. Active MyChart status and patient understanding of how to access instructions and care plan via MyChart confirmed with patient.    Telephone follow up appointment with pharmacy team member scheduled for: 2 months  Charlene Brooke, PharmD, [facility] Clinical Pharmacist [facility] [phone]

## 2022-02-25 NOTE — Progress Notes (Signed)
Chronic Care Management Pharmacy Note  Name:  Tony Manning   MRN :  [id number] DOB:  December 08, 1960  Summary: CCM F/U visit -DM: reviewed AGP report for Freestyle Libre - Avg BG 167, highest sugars are between 9a-3p and 7p-12a (after meals). He is not taking Novolin N as prescribed - typically takes 10-20 units once daily in AM rather than BID. Discussed optimizing insulin, pt agreed to change  Recommendations:  -Switch Novolin to [place] 10 units daily - plan to titrate by 1 unit daily until fasting BG <130 (see phone note for PCP approval)  Plan:  -[facility]ina will call patient 2 weeks for BG update -Pharmacist follow up televisit scheduled for 2 months -PCP visit 04/02/22    Subjective: Tony Manning is an 61 y.o. year old male who is a primary patient of [name], Elveria Rising, MD.  The CCM team was consulted for assistance with disease management and care coordination needs.    Engaged with patient by telephone for follow up visit in response to provider referral for pharmacy case management and/or care coordination services.   Consent to Services:  The patient was given information about Chronic Care Management services, agreed to services, and gave verbal consent prior to initiation of services.  Please see initial visit note for detailed documentation.   Patient Care Team: Tonia Ghent, MD as PCP - General (Family Medicine) Adrian Prows, MD as Consulting Physician (Cardiology) Charlton Haws, [facility] as Pharmacist (Pharmacist)  Recent office visits: 02/05/22 Dr Diona Browner VV: acute cough - rx'd zpak, codeine cough syrup. 01/09/22 Dr [name] OV: f/u - SOB. Take lasix for weight > 208#. Can try Lyrica 1 AM, 2 PM.  11/19/21 NP Eugenia Pancoast VV: covid-19. Rx'd Augmentin. 11/14/20 NP Eugenia Pancoast VV: covid-19. Rx'd Lagevrio. 09/05/2021 - [name], MD - Patient presented for Covid positive. Start: Lagevrio. Stop due to patient not taking: oxycodone 06/09/2021 -  [name], MD - Patient presented for [facility]. Labs: CMP, A1c, CBC, Lipid, PSA and Vit B. Foot exam completed. Ran out of Ozempic, will restart. A1c stable 7.2%, Kidney function declined, refer to renal clinic. Administered: cyanocobalamin ((VITAMIN B-12)) injection 1,000 mcg. No medication changes. Follow up 3 months.    Recent consult visits:  02/16/22 NP Tammy Parrett (pulmonary): f/u Sarcoid. D/c pregabalin. D/c fuorsemide, Kcl. Ordered PFTs.  01/05/22 PA Lawerance Cruel (cardiology): SOB/edema. Rx'd furosemide 20 mg and Kcl 10 mEq. Reduced amlodipine to 5 mg.  10/02/21 Dr Vikki Ports (psychology): chronic pain  - stable for spinal cord stimulator trial   Hospital visits:  None in previous 6 months  Objective:  Lab Results  Component Value Date   CREATININE 1.56 (H) 01/09/2022   BUN 19 01/09/2022   GFR 47.53 (L) 01/09/2022   GFRNONAA 54 (L) 11/12/2020   GFRAA 42 (L) 05/05/2019   NA 140 01/09/2022   K 4.9 01/09/2022   CALCIUM 9.6 01/09/2022   CO2 29 01/09/2022   GLUCOSE 156 (H) 01/09/2022    Lab Results  Component Value Date/Time   HGBA1C 7.2 (H) 12/23/2021 11:47 AM   HGBA1C 7.2 (H) 06/09/2021 09:23 AM   GFR 47.53 (L) 01/09/2022 12:05 PM   GFR 60.28 12/23/2021 11:47 AM   MICROALBUR 1.5 08/15/2012 09:05 AM    Last diabetic Eye exam:  Lab Results  Component Value Date/Time   HMDIABEYEEXA Retinopathy (A) 01/13/2021 12:00 AM    Last diabetic Foot exam:  10/25/20 normal   Lab Results  Component Value Date  CHOL 112 06/09/2021   HDL 40.20 06/09/2021   LDLCALC 57 06/09/2021   TRIG 73.0 06/09/2021   CHOLHDL 3 06/09/2021       Latest Ref Rng & Units 06/09/2021    9:23 AM 01/27/2021   12:40 PM 03/05/2020    2:46 PM  Hepatic Function  Total Protein 6.0 - 8.3 g/dL 6.8  7.5  7.5   Albumin 3.5 - 5.2 g/dL 4.3  4.8  4.6   AST 0 - 37 U/L $Remo'18  20  20   'ZCQkP$ ALT 0 - 53 U/L $Remo'23  21  21   'yMtWu$ Alk Phosphatase 39 - 117 U/L 90  78  67   Total Bilirubin 0.2 - 1.2 mg/dL 0.5  0.6   0.5   Bilirubin, Direct 0.0 - 0.3 mg/dL  0.1      Lab Results  Component Value Date/Time   TSH 1.34 01/27/2021 12:40 PM   TSH 2.22 03/21/2018 12:05 PM       Latest Ref Rng & Units 12/23/2021   11:47 AM 06/09/2021    9:23 AM 01/27/2021   12:40 PM  CBC  WBC 4.0 - 10.5 K/uL 5.9  7.2  7.9   Hemoglobin 13.0 - 17.0 g/dL 13.4  13.7  14.0   Hematocrit 39.0 - 52.0 % 39.6  40.9  40.6   Platelets 150.0 - 400.0 K/uL 193.0  198.0  223.0     Lab Results  Component Value Date/Time   VD25OH 23.11 (L) 12/23/2021 11:47 AM   VD25OH 19.37 (L) 03/21/2018 12:05 PM    Clinical ASCVD: Yes  The ASCVD Risk score (Arnett DK, et al., 2019) failed to calculate for the following reasons:   The patient has a prior MI or stroke diagnosis       12/23/2021   11:03 AM 09/20/2020    2:06 PM 04/21/2018    9:57 AM  Depression screen PHQ 2/9  Decreased Interest 0 0 0  Down, Depressed, Hopeless 0 0 0  PHQ - 2 Score 0 0 0  Altered sleeping  0   Tired, decreased energy  0   Change in appetite  0   Feeling bad or failure about yourself   0   Trouble concentrating  0   Moving slowly or fidgety/restless  0   Suicidal thoughts  0   PHQ-9 Score  0   Difficult doing work/chores  Not difficult at all     Social History   Tobacco Use  Smoking Status Never  Smokeless Tobacco Former   Types: Chew   Quit date: 02/06/2016   BP Readings from Last 3 Encounters:  02/16/22 114/64  02/05/22 124/72  01/09/22 120/60   Pulse Readings from Last 3 Encounters:  02/16/22 74  01/09/22 60  01/05/22 60   Wt Readings from Last 3 Encounters:  02/16/22 195 lb 9.6 oz (88.7 kg)  02/05/22 190 lb (86.2 kg)  01/09/22 208 lb (94.3 kg)   BMI Readings from Last 3 Encounters:  02/16/22 28.07 kg/m  02/05/22 27.26 kg/m  01/09/22 29.84 kg/m    Assessment/Interventions: Review of patient past medical history, allergies, medications, health status, including review of consultants reports, laboratory and other test data, was  performed as part of comprehensive evaluation and provision of chronic care management services.   SDOH:  (Social Determinants of Health) assessments and interventions performed: Yes      CCM Care Plan  Allergies  Allergen Reactions   Ambien [Zolpidem] Other (See Comments)    Parasomnias,  sleep walking   Lisinopril Cough   Gabapentin Other (See Comments)    REACTION: lack of effect for pain   Tramadol Diarrhea    Medications Reviewed Today     Reviewed by Charlton Haws, [facility] (Pharmacist) on 02/25/22 at 23  Med List Status: <None>   Medication Order Taking? Sig Documenting Provider Last Dose Status Informant  amLODipine (NORVASC) 5 MG tablet [id number] Yes Take 1 tablet (5 mg total) by mouth every morning. Cantwell, Celeste C, PA-C Taking Active   aspirin 81 MG chewable tablet [id number] Yes Chew 81 mg by mouth at bedtime. [provider] Taking Active Self  atorvastatin (LIPITOR) 40 MG tablet [id number] Yes TAKE ONE TABLET BY MOUTH EVERY MORNING Tonia Ghent, MD Taking Active   Blood Glucose Monitoring Suppl [facility] VERIO) w/Device KIT [id number] Yes Use to check blood sugars TID. Dx: E11.9 Tonia Ghent, MD Taking Active   carvedilol (COREG) 6.25 MG tablet [id number] Yes TAKE ONE TABLET BY MOUTH TWICE DAILY Adrian Prows, MD Taking Active   Cholecalciferol (VITAMIN D3) 50 MCG (2000 UT) capsule [id number] Yes Take 1 capsule (2,000 Units total) by mouth daily. Tonia Ghent, MD Taking Active   Continuous Blood Gluc Receiver (FREESTYLE LIBRE 2 READER) [place]VI [id number] Yes Use with sensors to monitor sugar continuously Tonia Ghent, MD Taking Active   Continuous Blood Gluc Sensor (FREESTYLE LIBRE 2 SENSOR) [place] [id number] Yes Apply sensor every 14 days to monitor sugar continously Tonia Ghent, MD Taking Active   cyanocobalamin (,VITAMIN B-12,) 1000 MCG/ML injection [id number] Yes Inject 1 mL (1,000 mcg total) into the muscle every 14 (fourteen) days. Tonia Ghent, MD Taking Active   cyclobenzaprine (FLEXERIL) 5 MG tablet [id number] Yes Take 1 tablet (5 mg total) by mouth 3 (three) times daily as needed for muscle spasms (sedation caution). Tonia Ghent, MD Taking Active   diclofenac Sodium (VOLTAREN) 1 % GEL [id number] Yes Apply 2-4 g topically 4 (four) times daily. Tonia Ghent, MD Taking Active   famotidine (PEPCID) 20 MG tablet [id number] Yes TAKE ONE TABLET BY MOUTH EVERYDAY AT BEDTIME Parrett, Fonnie Mu, NP Taking Active   glimepiride (AMARYL) 4 MG tablet [id number] Yes TAKE ONE TABLET BY MOUTH TWICE DAILY Tonia Ghent, MD Taking Active   glucose blood [facility] VERIO) test strip [id number] Yes USE TO check blood glucose THREE TIMES DAILY AS DIRECTED Tonia Ghent, MD Taking Active   guaiFENesin-codeine [facility]) 100-10 MG/5ML syrup [id number] Yes Take 5-10 mLs by mouth at bedtime as needed for cough. Jinny Sanders, MD Taking Active   Insulin Pen Needle 32G X 4 MM MISC [id number] Yes Use daily with insulin pen. Tonia Ghent, MD Taking Active Self  Insulin Syringe-Needle U-100 (INSULIN SYRINGE 1CC/31GX5/16") 31G X 5/16" 1 ML MISC [id number] Yes Use with NPH insulin twice daily. Tonia Ghent, MD Taking Active   isosorbide mononitrate (IMDUR) 30 MG 24 hr tablet [id number] Yes TAKE ONE TABLET BY MOUTH EVERYDAY AT BEDTIME Adrian Prows, MD Taking Active   Lancets [facility] ULTRASOFT) lancets [id number] Yes Use to check blood sugars TID. Dx: E11.9 Tonia Ghent, MD Taking Active   losartan (COZAAR) 100 MG tablet [id number] Yes TAKE ONE TABLET BY MOUTH EVERYDAY AT BEDTIME Adrian Prows, MD Taking Active   metFORMIN (GLUCOPHAGE) 500 MG tablet [id number] Yes TAKE ONE TABLET BY MOUTH TWICE DAILY Tonia Ghent, MD Taking Active   nitroGLYCERIN (NITROSTAT) 0.4 MG SL tablet [id number] Yes Place 1 tablet (0.4  mg total) under the tongue every 5 (five) minutes as needed for chest pain. Miquel Dunn, NP Taking Active   NOVOLIN N FLEXPEN 100  UNIT/ML FlexPen [id number] Yes Inject 20 Units into the skin in the morning and at bedtime.  Patient taking differently: Inject 10-20 Units into the skin daily.   Tonia Ghent, MD Taking Active   ondansetron [facility]) 4 MG tablet [id number] Yes Take 4 mg by mouth every 8 (eight) hours as needed. [provider] Taking Active   oxyCODONE-acetaminophen (PERCOCET/ROXICET) 5-325 MG tablet [id number] Yes Take 1 tablet by mouth 4 (four) times daily. [provider] Taking Active   pantoprazole (PROTONIX) 40 MG tablet [id number] Yes TAKE ONE TABLET BY MOUTH EVERY MORNING Tonia Ghent, MD Taking Active   Semaglutide, 1 MG/DOSE, 4 MG/3ML SOPN [id number] Yes Inject 1 mg as directed once a week. Tonia Ghent, MD Taking Active   tamsulosin [facility]) 0.4 MG CAPS capsule [id number] Yes TAKE ONE CAPSULE BY MOUTH EVERY MORNING and TAKE ONE CAPSULE BY MOUTH EVERYDAY AT BEDTIME Tonia Ghent, MD Taking Active             Patient Active Problem List   Diagnosis Date Noted   Acute cough 02/05/2022   Near syncope 12/25/2021   COVID-19 11/14/2021   Advance care planning 06/13/2021   Insomnia 06/13/2021   B12 deficiency 01/30/2021   Fatigue 01/29/2021   Abdominal discomfort 01/05/2021   SOB (shortness of breath) 11/12/2020   Sarcoidosis 11/12/2020   H/O colonoscopy 10/27/2020   Nocturia 05/03/2020   Right sided weakness 03/06/2020   Low back pain 01/29/2020   Facial weakness 11/16/2019   Hematuria 03/22/2018   Renal stones 03/22/2018   Vitamin D deficiency, unspecified 10/04/2017   Chronic neck pain (Primary Area of Pain) (B) (L>R) 09/21/2017   Chronic upper extremity pain (Secondary Area of Pain) (Bilateral) (L>R) 09/21/2017   Chronic left shoulder pain (Tertiary Area of Pain) 09/21/2017   Chronic low back pain (Fourth Area of Pain) (B) (R>L) 09/21/2017   Chronic pain of lower extremity (B) (R>L) 09/21/2017   Other long term (current) drug therapy 09/21/2017   Other  specified health status 09/21/2017   Other chronic pain 08/22/2017   Other fatigue 05/10/2017   Radiculopathy 04/07/2017   Angina pectoris ([place]) 10/15/2016   Cervical radiculitis 07/23/2016   Dysphagia 06/12/2016   Sciatica 02/06/2016   Plantar fasciitis 01/16/2016   Right sided sciatica 05/16/2015   Hand pain 04/05/2015   Premature ejaculation 09/17/2014   Heel pain 09/17/2014   NSTEMI (non-ST elevated myocardial infarction) ([place]) 06/13/2014   Atypical chest pain 06/06/2014   Memory change 05/24/2014   Benign paroxysmal positional vertigo 07/13/2013   Orthostasis 07/13/2013   Migraine, unspecified, without mention of intractable migraine without mention of status migrainosus 04/03/2013   Pain in joint, ankle and foot 10/19/2012   Irritable mood 01/18/2012   Neck pain 12/03/2010   Hearing loss 12/03/2010   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/20/2010   HYPERCHOLESTEROLEMIA 10/20/2010   DEPRESSION 10/20/2010   Osteoarthritis 10/20/2010   ARTHRITIS 10/20/2010   NEPHROLITHIASIS, HX OF 10/20/2010   ESSENTIAL HYPERTENSION, BENIGN 09/30/2010   CORONARY ATHEROSCLEROSIS NATIVE CORONARY ARTERY 09/30/2010   Sleep apnea 09/30/2010   CHEST PAIN UNSPECIFIED 09/30/2010   PERCUTANEOUS TRANSLUMINAL CORONARY ANGIOPLASTY, HX OF 09/30/2010    Immunization History  Administered Date(s) Administered   Influenza Inj Mdck Quad Pf 06/02/2018   Influenza Whole 09/17/2010   Influenza,inj,Quad PF,6+ Mos 06/08/2013, 05/22/2014,  05/12/2016, 05/08/2019, 05/27/2020   Influenza-Unspecified 06/08/2013, 05/22/2014, 06/08/2015, 05/12/2016, 05/21/2017, 06/02/2018, 05/09/2019, 05/21/2021   PFIZER(Purple Top)SARS-COV-2 Vaccination 11/27/2019, 12/25/2019, 08/07/2020, 05/21/2021   Pneumococcal Polysaccharide-23 09/08/2007, 02/16/2013   Td 09/07/2009    Conditions to be addressed/monitored:  Hypertension, Hyperlipidemia, Diabetes, Coronary Artery Disease, and Chronic Kidney  Disease  Care Plan : [place]  Updates made by Charlton Haws, [place] since 02/25/2022 12:00 AM     Problem: Hypertension, Hyperlipidemia, Diabetes, Coronary Artery Disease, and Chronic Kidney Disease   Priority: High     Long-Range Goal: Disease mgmt   Start Date: 11/27/2020  Expected End Date: 11/22/2022  This Visit's Progress: On track  Recent Progress: On track  Priority: High  Note:   Current Barriers:  Uncontrolled diabetes  Pharmacist Clinical Goal(s):  Patient will adhere to plan to optimize therapeutic regimen for diabetes as evidenced by report of adherence to recommended medication management changes through collaboration with PharmD and provider.   Interventions: 1:1 collaboration with Tonia Ghent, MD regarding development and update of comprehensive plan of care as evidenced by provider attestation and co-signature Inter-disciplinary care team collaboration (see longitudinal plan of care) Comprehensive medication review performed; medication list updated in electronic medical record  Hypertension / CKD Stage 3b (BP goal <130/80) -Controlled - BP at goal in office  -Current treatment: Amodipine 5 mg daily - Appropriate, Effective, Safe, Accessible Carvedilol 6.25 mg BID -Appropriate, Effective, Safe, Accessible Isosorbide MN 30 mg HS -Appropriate, Effective, Safe, Accessible Losartan 100 mg daily -Appropriate, Effective, Safe, Accessible -Medications previously tried: n/a  -Educated on BP goals and benefits of medications for prevention of heart attack, stroke and kidney damage; -Counseled to monitor BP at home periodically, document, and provide log at future appointments -Recommended to continue current medication  Hyperlipidemia / CAD (LDL goal < 70) -Controlled - LDL 59 -Hx CAD. NSTEMI 06/2014 -Current treatment: Atorvastatin 40 mg daily -Appropriate, Effective, Safe, Accessible Aspirin 81 mg daily -Appropriate, Effective, Safe,  Accessible Isosorbide MN 30 mg daily -Appropriate, Effective, Safe, Accessible Nitroglycerin 0.4 mg SL prn -Appropriate, Effective, Safe, Accessible -Medications previously tried: none  -Recommended to continue current medication  Diabetes (A1c goal <7%) -Not ideally controlled - A1c 7.2% (06/2021); pt reports he typically takes Novolin only once per day in AM and adjusts dose based on fasting BG, typically 10-15 units but sometimes up to 20 units. He reports he eats 1 meal/day typically late in the day (5-7 pm) and drinks a Cheerwine in the morning "to get going"; reviewed AGP report (below) - BG is elevated 9am-3pm likely due to AM Cheerwine, advised pt to switch to diet or other sugar-free alternative but pt said he would "rather tear his throat out" than stop his Cheerwine or switch to diet -Reviewed AGP report: 02/12/22 - 02/25/22  Time in range (70-180): 62% (goal > 70%)  High (181-250): 38% (3% very high > 250)  Low (< 70): 0% (goal < 4%)  GMI: 7.2%; Average glucose: 167  -Current medications: Glimepiride 4 mg BID - Appropriate, Query Effective, Novolin N - 20 units BID -Appropriate, Query Effective, Metformin 500 mg BID -Appropriate, Query Effective, Ozempic 1 mg weekly -Appropriate, Effective, Safe, Accessible -Medications previously tried: none reported -Discussed optimizing insulin - he pays $47 for Novolin and can now get basal insulin for $35/month with new Medicare laws, reviewed benefits of 24+ hr basal insulin and pt agreed to change -Recommend to switch Novolin to [place] 10 units (conservative dose change since pt is not typically  taking Novolin BID); plant to titrate [place] by 1 units to target fasting BG < 130  Chronic Kidney Disease Stage 3b  -All medications assessed for renal dosing and appropriateness in chronic kidney disease. -Recommended to continue current medication  Chronic pain (Goal: manage symptoms) -Controlled -cervical radiculitis, sciatica,  osteoarthritis. Injections from other did not help, going to pain mgmt. -Current treatment  Oxycodone-APAP 5-325 mg Cyclobenzaprine 5 mg TID prn -Medications previously tried: n/a  -Recommended to continue current medication  Health Maintenance -Vaccine gaps: Shingrix   Patient Goals/Self-Care Activities Patient will:  - take medications as prescribed as evidenced by patient report and record review focus on medication adherence by routine check glucose daily, document, and provide at future appointments collaborate with provider on medication access solutions (Ozempic)      Compliance/Adherence/Medication fill history: Care Gaps: Colonoscopy (due 03/23/2018)  Star-Rating Drugs: Atorvastatin - PDC 100% Glimepiride - PDC 100% Metformin - PDC 100% Losartan - PDC 100%  Medication Assistance:  [place] denied 2023 due to income too high.  Medication Access: Within the past 30 days, how often has patient missed a dose of medication? 0 Is a pillbox or other method used to improve adherence? Yes  Factors that may affect medication adherence? no barriers identified Are meds synced by current pharmacy? Yes  Are meds delivered by current pharmacy? Yes  Does patient experience delays in picking up medications due to transportation concerns? No   [facility] Reviewed: Is patient disadvantaged to use [facility]?: No  Current Rx insurance plan: [employer] [place] Name and location of Current pharmacy:  [facility] - [place], [place] - [address] [place] [address] Phone: [phone] Fax: [phone]  [facility] services reviewed with patient today?: Yes  Pill Packs, 30 DS (last delivery 02/05/22) Aspirin 81 mg - 1 tablet daily (1 breakfast)  Amlodipine 10 mg- 1 tablet daily (1 breakfast) Atorvastatin 40 mg- 1 tablet daily (1 breakfast) Carvedilol 6.25 mg- 1 tablet twice daily (1 breakfast, 1  evening meal) Famotidine 20 mg- 1 tablet daily (1 bedtime) Glimepiride 4 mg- 1 tablet twice daily (1 breakfast, 1 evening meal) Isosorbide mononitrate 30 mg- 1 tablet daily (1 bedtime) Losartan 100 mg- 1 tablet daily- (1 bedtime) Metformin 1000 mg- 1/2 tablet twice daily (1 breakfast, 1 evening meal) Pantoprazole 40 mg- 1 tablet daily (1 breakfast) Tamsulosin 0.4 mg- 2 capsule daily (1 breakfast, 1 bedtime)   Vial medications: Ozempic - 1 mg weekly  Patient declined:  Novolin N Flexpen 100 unit/mL (3 mL)  Care Plan and Follow Up Patient Decision:  Patient agrees to Care Plan and Follow-up.  Follow Up Plan: Telephone follow up appointment with care management team member scheduled for: 2 months  Charlene Brooke, PharmD, [facility] Clinical Pharmacist [facility] [phone]

## 2022-02-25 NOTE — Progress Notes (Signed)
Per orders of Dr. Marne Tower, injection of B-12 given by Cree Napoli in left deltoid. Patient tolerated injection well.     

## 2022-02-25 NOTE — Telephone Encounter (Signed)
Discussed DM control with patient. He is currently taking Novolin N 10-20 units once daily in AM (very rarely takes it BID as prescribed), he adjusts dose based on fasting BG and typically takes between 10-15 units.  Reviewed AGP report: 02/12/22 - 02/25/22  Time in range (70-180): 62% (goal > 70%)  High (181-250): 38% (3% very high > 250)  Low (< 70): 0% (goal < 4%)  GMI: 7.2%; Average glucose: 167   Discussed optimizing insulin - he can get any preferred insulin for $35 per month with new Medicare laws. He agreed to change to 24+ hr basal insulin.  Recommend to switch Novolin to [place] 10 units daily - plan to titrate by 1 unit daily until fasting BG < 130.

## 2022-02-25 NOTE — Telephone Encounter (Signed)
Agree with switch Novolin to [place] 10 units daily - plan to titrate by 1 unit daily until fasting BG < 130.   rx sent.  Thanks.

## 2022-02-26 ENCOUNTER — Ambulatory Visit: Payer: Medicare HMO

## 2022-03-02 DIAGNOSIS — E1149 Type 2 diabetes mellitus with other diabetic neurological complication: Secondary | ICD-10-CM | POA: Diagnosis not present

## 2022-03-02 DIAGNOSIS — Z794 Long term (current) use of insulin: Secondary | ICD-10-CM | POA: Diagnosis not present

## 2022-03-11 ENCOUNTER — Telehealth: Payer: Self-pay

## 2022-03-11 NOTE — Progress Notes (Signed)
Patient has been made aware to try and not eat late night snacks so he could avoid his bedtime readings being elevated. I advised him his readings were elevated in the 200s before switching to [place] so we recommend he continue to tritiate by increasing the [place] by 1 unit daily until his glucose readings (while fasting) are <130. Asked patient to take his glucose readings every 8 hours and suggested setting an alarm so we could obtain accurate readings on how he is doing on the [place].   Charlene Brooke, CPP notified  Marijean Niemann, [place] Clinical Pharmacy Assistant [phone]

## 2022-03-11 NOTE — Telephone Encounter (Signed)
Was able to access patient's libreview account to assess trends in blood sugars. Patient is not scanning every 8 hours so CGM data has only been captured for 59% of the last 2 weeks. Some possible elevated bedtime readings which would lead to elevated FBGs  -Recommend scanning at least every 8 hours to further assess trends and consider switching to Graball 3 to avoid gaps in history without the need to scan. -Blood sugars were in the 200s prior to switching to [place]. Plan to continue with titration of Tresiba by 1 unit daily until FBGs are < 130 -Recommend avoiding late night snacking or eating to avoid elevated bedtime readings.  Will route to Marijean Niemann to relay to the patient and plan for Mendel Ryder to check on Libreview report next week.

## 2022-03-11 NOTE — Progress Notes (Signed)
    Chronic Care Management Pharmacy Assistant   Name: ZOLLIE ELLERY  MRN: [id number] DOB: February 15, 1961  Reason for Encounter: CCM Tyler Aas Concerns)   Patient called me stating he started his new medication Tyler Aas) on Sunday (03/08/22). Patient was to start with 10 units daily and increase by 1 unit daily until fasting BG <130 per Dr. [name] on 02/25/22.  Patient has been taking [place] for four days now and is taking 13 units currently. Patient stated his average glucose for the past seven days is 190. He stated since he has started the [place] he can not get his blood glucose out of the 200s. This morning when he took it (patient called me at 9:00am on 03/11/22) it was 233. Patient is concerned that his numbers are higher on the [place] than they were before. Patient did not have his glucose readings, but stated that he has them linked to our system so we should be able to pull the records and see them. I advised patient I would return his call with next steps.   Charlene Brooke, CPP notified  Marijean Niemann, [place] Clinical Pharmacy Assistant [phone]

## 2022-03-12 ENCOUNTER — Ambulatory Visit: Payer: Medicare HMO

## 2022-03-16 NOTE — Telephone Encounter (Signed)
Noted. Thanks.

## 2022-03-16 NOTE — Telephone Encounter (Signed)
Contacted patient to discuss sugar levels via Freestyle Libre 2. His fasting BG was 146 this morning. He is up to 15 units of Tresiba per day is currently titrating by 1 unit daily until FBG < 130. Pt denies s/sx of hypoglycemia.  Reviewed AGP report: 03/03/22 to 03/16/22  Time in range (70-180): 56% (goal > 70%)  High (>180): 43%  Low (< 70): 1% (goal < 4%)  GMI: 7.4%; Average glucose: 171   Per report there is a lot of missing data from CGM indicating patient is not scanning at night. Advised pt to scan Libre at least every 8 hours.   Recommend to continue [place] - titrate by 1 unit daily until fasting BG < 130. Likely will end up at 16-20 units.

## 2022-03-20 ENCOUNTER — Ambulatory Visit: Payer: Medicare HMO | Admitting: Adult Health

## 2022-03-25 ENCOUNTER — Other Ambulatory Visit: Payer: Self-pay | Admitting: Cardiology

## 2022-03-25 ENCOUNTER — Other Ambulatory Visit: Payer: Self-pay | Admitting: Family Medicine

## 2022-03-25 DIAGNOSIS — Z794 Long term (current) use of insulin: Secondary | ICD-10-CM

## 2022-03-25 DIAGNOSIS — I25118 Atherosclerotic heart disease of native coronary artery with other forms of angina pectoris: Secondary | ICD-10-CM

## 2022-03-26 ENCOUNTER — Telehealth: Payer: Self-pay

## 2022-03-26 NOTE — Progress Notes (Signed)
Chronic Care Management Pharmacy Assistant   Name: Tony Manning  MRN: [id number] DOB: [birth year]   Reason for Encounter: CCM (Medication Adherence and Delivery Coordination)  Recent office visits:  02/25/22 Administered: cyanocobalamin ((VITAMIN B-12)) injection 1,000 mcg  Recent consult visits:  None since last CCM contact  Hospital visits:  None in previous 6 months  Medications: Outpatient Encounter Medications as of 03/26/2022  Medication Sig   amLODipine (NORVASC) 5 MG tablet Take 1 tablet (5 mg total) by mouth every morning.   aspirin 81 MG chewable tablet Chew 81 mg by mouth at bedtime.   atorvastatin (LIPITOR) 40 MG tablet TAKE ONE TABLET BY MOUTH EVERY MORNING   Blood Glucose Monitoring Suppl (ONETOUCH VERIO) w/Device KIT Use to check blood sugars TID. Dx: E11.9   carvedilol (COREG) 6.25 MG tablet TAKE ONE TABLET BY MOUTH TWICE DAILY   Cholecalciferol (VITAMIN D3) 50 MCG (2000 UT) capsule Take 1 capsule (2,000 Units total) by mouth daily.   Continuous Blood Gluc Receiver (FREESTYLE LIBRE 2 READER) DEVI Use with sensors to monitor sugar continuously   Continuous Blood Gluc Sensor (FREESTYLE LIBRE 2 SENSOR) MISC Apply sensor every 14 days to monitor sugar continously   cyanocobalamin (,VITAMIN B-12,) 1000 MCG/ML injection Inject 1 mL (1,000 mcg total) into the muscle every 14 (fourteen) days.   cyclobenzaprine (FLEXERIL) 5 MG tablet Take 1 tablet (5 mg total) by mouth 3 (three) times daily as needed for muscle spasms (sedation caution).   diclofenac Sodium (VOLTAREN) 1 % GEL Apply 2-4 g topically 4 (four) times daily.   famotidine (PEPCID) 20 MG tablet TAKE ONE TABLET BY MOUTH EVERYDAY AT BEDTIME   glimepiride (AMARYL) 4 MG tablet TAKE ONE TABLET BY MOUTH TWICE DAILY   glucose blood (ONETOUCH VERIO) test strip USE TO check blood glucose THREE TIMES DAILY AS DIRECTED   guaiFENesin-codeine (ROBITUSSIN AC) 100-10 MG/5ML syrup Take 5-10 mLs by mouth at bedtime as needed  for cough.   insulin degludec (TRESIBA FLEXTOUCH) 100 UNIT/ML FlexTouch Pen Inject 10 units daily - increase by 1 unit daily until fasting BG < 130.   insulin NPH Human (NOVOLIN N) 100 UNIT/ML injection NovoLIN N   Insulin Pen Needle 32G X 4 MM MISC Use daily with insulin pen.   Insulin Syringe-Needle U-100 (INSULIN SYRINGE 1CC/31GX5/16") 31G X 5/16" 1 ML MISC Use with NPH insulin twice daily.   isosorbide mononitrate (IMDUR) 30 MG 24 hr tablet TAKE ONE TABLET BY MOUTH EVERYDAY AT BEDTIME   Lancets (ONETOUCH ULTRASOFT) lancets Use to check blood sugars TID. Dx: E11.9   losartan (COZAAR) 100 MG tablet TAKE ONE TABLET BY MOUTH EVERYDAY AT BEDTIME   metFORMIN (GLUCOPHAGE) 500 MG tablet TAKE ONE TABLET BY MOUTH TWICE DAILY   nitroGLYCERIN (NITROSTAT) 0.4 MG SL tablet Place 1 tablet (0.4 mg total) under the tongue every 5 (five) minutes as needed for chest pain.   ondansetron (ZOFRAN) 4 MG tablet Take 4 mg by mouth every 8 (eight) hours as needed.   oxyCODONE-acetaminophen (PERCOCET/ROXICET) 5-325 MG tablet Take 1 tablet by mouth 4 (four) times daily.   OZEMPIC, 1 MG/DOSE, 4 MG/3ML SOPN inject 1mg  into THE SKIN ONCE A WEEK   pantoprazole (PROTONIX) 40 MG tablet TAKE ONE TABLET BY MOUTH EVERY MORNING   Semaglutide,0.25 or 0.5MG /DOS, (OZEMPIC, 0.25 OR 0.5 MG/DOSE,) 2 MG/1.5ML SOPN Ozempic   tamsulosin (FLOMAX) 0.4 MG CAPS capsule TAKE ONE CAPSULE BY MOUTH EVERY MORNING and TAKE ONE CAPSULE BY MOUTH EVERYDAY AT BEDTIME   No  facility-administered encounter medications on file as of 03/26/2022.   BP Readings from Last 3 Encounters:  02/16/22 114/64  02/05/22 124/72  01/09/22 120/60    Lab Results  Component Value Date   HGBA1C 7.2 (H) 12/23/2021    Recent OV, Consult or Hospital visit:  No medication changes indicated  Last adherence delivery date: 03/06/22      Patient is due for next adherence delivery on:  04/07/2022  Spoke with patient on 03/27/2022 reviewed medications and coordinated  delivery.  This delivery to include: Adherence Packaging  30 Days  Packs: Amlodipine 10mg - 1 tablet daily (1 breakfast) Aspirin 81mg - 1 tablet daily (breakfast) Metformin 500mg  1 tablet twice daily (1 tablet  breakfast, 1 tablet evening meal) Carvedilol 6.25mg - 1 tablet twice daily (1 breakfast, 1 evening meal) Isosorbide 30mg - 1 tablet daily (1 bedtime) Tamsulosin 0.4mg  2 capsule daily (1 breakfast, 1 bedtime)   Losartan 100 mg- 1 tablet daily- (1 bedtime) Atorvastatin 40mg - 1 tablet daily (1 breakfast) Glimepiride 4mg - 1 tablet twice daily (1 breakfast, 1 evening meal) Pantoprazole 40 mg- 1 tablet daily (1 breakfast)   Vial medications: Ozempic - 1 mg weekly  Tresiba Flex Inj 100 units - inject 10u daily, inreasing by 1 unit as directed until BG < 130. Max daily dose 20 units   Patient declined:  None   Any concerns about your medications? No  How often do you forget or accidentally miss a dose? Rarely  Do you use a pillbox? No  Is patient in packaging Yes  If yes  What is the date on your next pill pack? 04/07/2022  Any concerns or issues with your packaging? No  Refills requested from providers include: ALL COMPLETE Carvedilol 6.25mg - 1 tablet twice daily (1 breakfast, 1 evening meal) Isosorbide 30mg - 1 tablet daily (1 bedtime) Ozempic - 1 mg weekly Glimepiride 4mg - 1 tablet twice daily (1 breakfast, 1 evening meal) Pantoprazole 40 mg- 1 tablet daily (1 breakfast)  Confirmed delivery date of 04/07/2022, advised patient that pharmacy will contact them the morning of delivery.   Recent blood pressure readings are as follows: Patient did not have any readings.   Recent blood glucose readings are as follows: Patient did not have any readings.   I have scheduled patient a CGM appointment with Charlene Brooke on 04/10/22 at 11:45 to check the accuracy of his CGM. Patient stated it will go off 4 - 5 times during the night reading as low as 49. Patient knows it is not that  low as he will check it with a finger stick. Patient also stated when the alarm goes off patient feels completely normal so he knows his sugar is not that low. Patient was unable to come to the office until 08/04.   Annual wellness visit in last year? No Most Recent BP reading: 114/64 on 02/16/22  If Diabetic: Most recent A1C reading: 7.2 on 12/23/2021 Last eye exam / retinopathy screening: 01/13/2021 Last diabetic foot exam: Up To Date   Charlene Brooke, CPP notified   Marijean Niemann, [place] [phone]  Cycle dispensing form sent to Margaretmary Dys, PTM for review.

## 2022-04-01 DIAGNOSIS — E1149 Type 2 diabetes mellitus with other diabetic neurological complication: Secondary | ICD-10-CM | POA: Diagnosis not present

## 2022-04-01 DIAGNOSIS — Z794 Long term (current) use of insulin: Secondary | ICD-10-CM | POA: Diagnosis not present

## 2022-04-02 ENCOUNTER — Ambulatory Visit: Payer: Medicare HMO | Admitting: Family Medicine

## 2022-04-06 ENCOUNTER — Telehealth: Payer: Self-pay

## 2022-04-06 NOTE — Progress Notes (Signed)
    Chronic Care Management Pharmacy Assistant   Name: Tony Manning  MRN: [id number] DOB: 09/17/1960  Reason for Encounter: CCM (Pt Stopping Ozempic)   Patient called me to inform me that he has to stop taking Ozempic due to being in the donut hole. Medication will now cost him over $200.00 a month. Patient can not afford the cost. Patient stated without being on the medication his sugar levels and weight will now go up.   Charlene Brooke, CPP notified  Marijean Niemann, [place] Clinical Pharmacy Assistant [phone]

## 2022-04-07 ENCOUNTER — Telehealth: Payer: Self-pay

## 2022-04-07 NOTE — Progress Notes (Signed)
Chronic Care Management Pharmacy Assistant   Name: Tony Manning  MRN: [id number] DOB: [birth year]  Reason for Encounter: CCM (Appointment Reminder)  Medications: Outpatient Encounter Medications as of 04/07/2022  Medication Sig   amLODipine (NORVASC) 5 MG tablet Take 1 tablet (5 mg total) by mouth every morning.   aspirin 81 MG chewable tablet Chew 81 mg by mouth at bedtime.   atorvastatin (LIPITOR) 40 MG tablet TAKE ONE TABLET BY MOUTH EVERY MORNING   Blood Glucose Monitoring Suppl (ONETOUCH VERIO) w/Device KIT Use to check blood sugars TID. Dx: E11.9   carvedilol (COREG) 6.25 MG tablet TAKE ONE TABLET BY MOUTH TWICE DAILY   Cholecalciferol (VITAMIN D3) 50 MCG (2000 UT) capsule Take 1 capsule (2,000 Units total) by mouth daily.   Continuous Blood Gluc Receiver (FREESTYLE LIBRE 2 READER) DEVI Use with sensors to monitor sugar continuously   Continuous Blood Gluc Sensor (FREESTYLE LIBRE 2 SENSOR) MISC Apply sensor every 14 days to monitor sugar continously   cyanocobalamin (,VITAMIN B-12,) 1000 MCG/ML injection Inject 1 mL (1,000 mcg total) into the muscle every 14 (fourteen) days.   cyclobenzaprine (FLEXERIL) 5 MG tablet Take 1 tablet (5 mg total) by mouth 3 (three) times daily as needed for muscle spasms (sedation caution).   diclofenac Sodium (VOLTAREN) 1 % GEL Apply 2-4 g topically 4 (four) times daily.   famotidine (PEPCID) 20 MG tablet TAKE ONE TABLET BY MOUTH EVERYDAY AT BEDTIME   glimepiride (AMARYL) 4 MG tablet TAKE ONE TABLET BY MOUTH TWICE DAILY   glucose blood (ONETOUCH VERIO) test strip USE TO check blood glucose THREE TIMES DAILY AS DIRECTED   guaiFENesin-codeine (ROBITUSSIN AC) 100-10 MG/5ML syrup Take 5-10 mLs by mouth at bedtime as needed for cough.   insulin degludec (TRESIBA FLEXTOUCH) 100 UNIT/ML FlexTouch Pen Inject 10 units daily - increase by 1 unit daily until fasting BG < 130.   insulin NPH Human (NOVOLIN N) 100 UNIT/ML injection NovoLIN N   Insulin Pen  Needle 32G X 4 MM MISC Use daily with insulin pen.   Insulin Syringe-Needle U-100 (INSULIN SYRINGE 1CC/31GX5/16") 31G X 5/16" 1 ML MISC Use with NPH insulin twice daily.   isosorbide mononitrate (IMDUR) 30 MG 24 hr tablet TAKE ONE TABLET BY MOUTH EVERYDAY AT BEDTIME   Lancets (ONETOUCH ULTRASOFT) lancets Use to check blood sugars TID. Dx: E11.9   losartan (COZAAR) 100 MG tablet TAKE ONE TABLET BY MOUTH EVERYDAY AT BEDTIME   metFORMIN (GLUCOPHAGE) 500 MG tablet TAKE ONE TABLET BY MOUTH TWICE DAILY   nitroGLYCERIN (NITROSTAT) 0.4 MG SL tablet Place 1 tablet (0.4 mg total) under the tongue every 5 (five) minutes as needed for chest pain.   ondansetron (ZOFRAN) 4 MG tablet Take 4 mg by mouth every 8 (eight) hours as needed.   oxyCODONE-acetaminophen (PERCOCET/ROXICET) 5-325 MG tablet Take 1 tablet by mouth 4 (four) times daily.   OZEMPIC, 1 MG/DOSE, 4 MG/3ML SOPN inject 1mg  into THE SKIN ONCE A WEEK   pantoprazole (PROTONIX) 40 MG tablet TAKE ONE TABLET BY MOUTH EVERY MORNING   Semaglutide,0.25 or 0.5MG /DOS, (OZEMPIC, 0.25 OR 0.5 MG/DOSE,) 2 MG/1.5ML SOPN Ozempic   tamsulosin (FLOMAX) 0.4 MG CAPS capsule TAKE ONE CAPSULE BY MOUTH EVERY MORNING and TAKE ONE CAPSULE BY MOUTH EVERYDAY AT BEDTIME   No facility-administered encounter medications on file as of 04/07/2022.   TAMAS SUEN was contacted to remind of upcoming office visit with Charlene Brooke on 08/04/232 at 11:45. Patient was reminded to have any blood glucose  and blood pressure readings available for review at appointment.   Patient confirmed appointment.  Are you having any problems with your medications? No   Do you have any concerns you like to discuss with the pharmacist? Yes Patient stated he would like a referral to an acupuncturist. Stated his pain management doctor has left the practice. Patient was told they will get a new doctor, but they unsure of when. Stated the muscle in his left side is so painful he can not deal with  it any longer. PT did not work for him and he can not afford to continue with the co-pays. [name] Kitchen   CCM referral has been placed prior to visit?  No   Star Rating Drugs: Medication:  Last Fill: Day Supply Atorvastatin 40 mg 04/01/22 30 Losartan 100 mg 04/01/22 30 Metformin 500 mg 04/01/22 30 Ozempic 1 mg 04/01/22 28 - did not pick up $200 - took last dose on 04/05/22  Charlene Brooke, CPP notified  Marijean Niemann, [place] Pharmacy Assistant [phone]

## 2022-04-10 ENCOUNTER — Ambulatory Visit: Payer: Medicare HMO | Admitting: Pharmacist

## 2022-04-10 DIAGNOSIS — E7800 Pure hypercholesterolemia, unspecified: Secondary | ICD-10-CM

## 2022-04-10 DIAGNOSIS — Z794 Long term (current) use of insulin: Secondary | ICD-10-CM

## 2022-04-10 DIAGNOSIS — I10 Essential (primary) hypertension: Secondary | ICD-10-CM

## 2022-04-10 NOTE — Progress Notes (Signed)
Chronic Care Management Pharmacy Note  Name:  Tony Manning   MRN :  [id number] DOB:  01-25-1961  Summary: CCM F/U visit -DM: reviewed AGP report for Freestyle Libre - Avg BG 154, time-in-range 74%, highest sugars are 200-250 between 9a-12p and 7p-12a (after meals). He is up to [place] 19 units daily, however he will have to stop Ozempic due to high cost in [place] (previously denied from [employer] PAP); he will need more insulin to compensate off Ozempic  Recommendations:  -Advised to increase [place] to 20 units, then continue to increase by 1 unit daily until fasting BG < 130 -Advised pt to scan Libre at bedtime to avoid missing data  Plan:  -Pharmacist follow up televisit scheduled for 2 weeks -PCP visit 04/02/22 - pt cancelled and needs to reschedule    Subjective: Tony Manning is an 61 y.o. year old male who is a primary patient of [name], Elveria Rising, MD.  The CCM team was consulted for assistance with disease management and care coordination needs.    Engaged with patient by telephone for follow up visit in response to provider referral for pharmacy case management and/or care coordination services.   Consent to Services:  The patient was given information about Chronic Care Management services, agreed to services, and gave verbal consent prior to initiation of services.  Please see initial visit note for detailed documentation.   Patient Care Team: Tonia Ghent, MD as PCP - General (Family Medicine) Adrian Prows, MD as Consulting Physician (Cardiology) Charlton Haws, [facility] as Pharmacist (Pharmacist)  Recent office visits: 02/05/22 Dr Diona Browner VV: acute cough - rx'd zpak, codeine cough syrup. 01/09/22 Dr [name] OV: f/u - SOB. Take lasix for weight > 208#. Can try Lyrica 1 AM, 2 PM.  11/19/21 NP Eugenia Pancoast VV: covid-19. Rx'd Augmentin. 11/14/20 NP Eugenia Pancoast VV: covid-19. Rx'd Lagevrio. 09/05/2021 - [name], MD - Patient presented for Covid  positive. Start: Lagevrio. Stop due to patient not taking: oxycodone 06/09/2021 - [name], MD - Patient presented for [facility]. Labs: CMP, A1c, CBC, Lipid, PSA and Vit B. Foot exam completed. Ran out of Ozempic, will restart. A1c stable 7.2%, Kidney function declined, refer to renal clinic. Administered: cyanocobalamin ((VITAMIN B-12)) injection 1,000 mcg. No medication changes. Follow up 3 months.    Recent consult visits:  02/16/22 NP Tammy Parrett (pulmonary): f/u Sarcoid. D/c pregabalin. D/c lasix, Kcl. Ordered PFTs.  01/05/22 PA Lawerance Cruel (cardiology): SOB/edema. Rx'd furosemide 20 mg and Kcl 10 mEq. Reduced amlodipine to 5 mg.  10/02/21 Dr Vikki Ports (psychology): chronic pain  - stable for spinal cord stimulator trial   Hospital visits:  None in previous 6 months  Objective:  Lab Results  Component Value Date   CREATININE 1.56 (H) 01/09/2022   BUN 19 01/09/2022   GFR 47.53 (L) 01/09/2022   GFRNONAA 54 (L) 11/12/2020   GFRAA 42 (L) 05/05/2019   NA 140 01/09/2022   K 4.9 01/09/2022   CALCIUM 9.6 01/09/2022   CO2 29 01/09/2022   GLUCOSE 156 (H) 01/09/2022    Lab Results  Component Value Date/Time   HGBA1C 7.2 (H) 12/23/2021 11:47 AM   HGBA1C 7.2 (H) 06/09/2021 09:23 AM   GFR 47.53 (L) 01/09/2022 12:05 PM   GFR 60.28 12/23/2021 11:47 AM   MICROALBUR 1.5 08/15/2012 09:05 AM    Last diabetic Eye exam:  Lab Results  Component Value Date/Time   HMDIABEYEEXA Retinopathy (A) 01/13/2021 12:00 AM  Last diabetic Foot exam:  10/25/20 normal   Lab Results  Component Value Date   CHOL 112 06/09/2021   HDL 40.20 06/09/2021   LDLCALC 57 06/09/2021   TRIG 73.0 06/09/2021   CHOLHDL 3 06/09/2021       Latest Ref Rng & Units 06/09/2021    9:23 AM 01/27/2021   12:40 PM 03/05/2020    2:46 PM  Hepatic Function  Total Protein 6.0 - 8.3 g/dL 6.8  7.5  7.5   Albumin 3.5 - 5.2 g/dL 4.3  4.8  4.6   AST 0 - 37 U/L _0 ALT 0 - 53 U/L _1 Alk  Phosphatase 39 - 117 U/L 90  78  67   Total Bilirubin 0.2 - 1.2 mg/dL 0.5  0.6  0.5   Bilirubin, Direct 0.0 - 0.3 mg/dL  0.1      Lab Results  Component Value Date/Time   TSH 1.34 01/27/2021 12:40 PM   TSH 2.22 03/21/2018 12:05 PM       Latest Ref Rng & Units 12/23/2021   11:47 AM 06/09/2021    9:23 AM 01/27/2021   12:40 PM  CBC  WBC 4.0 - 10.5 K/uL 5.9  7.2  7.9   Hemoglobin 13.0 - 17.0 g/dL 13.4  13.7  14.0   Hematocrit 39.0 - 52.0 % 39.6  40.9  40.6   Platelets 150.0 - 400.0 K/uL 193.0  198.0  223.0     Lab Results  Component Value Date/Time   VD25OH 23.11 (L) 12/23/2021 11:47 AM   VD25OH 19.37 (L) 03/21/2018 12:05 PM    Clinical ASCVD: Yes  The ASCVD Risk score (Arnett DK, et al., 2019) failed to calculate for the following reasons:   The patient has a prior MI or stroke diagnosis       12/23/2021   11:03 AM 09/20/2020    2:06 PM 04/21/2018    9:57 AM  Depression screen PHQ 2/9  Decreased Interest 0 0 0  Down, Depressed, Hopeless 0 0 0  PHQ - 2 Score 0 0 0  Altered sleeping  0   Tired, decreased energy  0   Change in appetite  0   Feeling bad or failure about yourself   0   Trouble concentrating  0   Moving slowly or fidgety/restless  0   Suicidal thoughts  0   PHQ-9 Score  0   Difficult doing work/chores  Not difficult at all     Social History   Tobacco Use  Smoking Status Never  Smokeless Tobacco Former   Types: Chew   Quit date: 02/06/2016   BP Readings from Last 3 Encounters:  02/16/22 114/64  02/05/22 124/72  01/09/22 120/60   Pulse Readings from Last 3 Encounters:  02/16/22 74  01/09/22 60  01/05/22 60   Wt Readings from Last 3 Encounters:  02/16/22 195 lb 9.6 oz (88.7 kg)  02/05/22 190 lb (86.2 kg)  01/09/22 208 lb (94.3 kg)   BMI Readings from Last 3 Encounters:  02/16/22 28.07 kg/m  02/05/22 27.26 kg/m  01/09/22 29.84 kg/m    Assessment/Interventions: Review of patient past medical history, allergies, medications, health  status, including review of consultants reports, laboratory and other test data, was performed as part of comprehensive evaluation and provision of chronic care management services.   SDOH:  (Social Determinants of Health) assessments and interventions performed: No - done 11/2021   SDOH Screenings  Alcohol Screen: Low Risk  (09/20/2020)   Alcohol Screen    Last Alcohol Screening Score (AUDIT): 0  Depression (PHQ2-9): Low Risk  (12/23/2021)   Depression (PHQ2-9)    PHQ-2 Score: 0  Financial Resource Strain: Medium Risk (11/21/2021)   Overall Financial Resource Strain (CARDIA)    Difficulty of Paying Living Expenses: Somewhat hard  Food Insecurity: No Food Insecurity (11/21/2021)   Hunger Vital Sign    Worried About Running Out of Food in the Last Year: Never true    Ran Out of Food in the Last Year: Never true  Housing: Low Risk  (09/20/2020)   Housing    Last Housing Risk Score: 0  Physical Activity: Inactive (09/20/2020)   Exercise Vital Sign    Days of Exercise per Week: 0 days    Minutes of Exercise per Session: 0 min  Social Connections: Not on file  Stress: No Stress Concern Present (09/20/2020)   [place]    Feeling of Stress : Not at all  Tobacco Use: Medium Risk (02/16/2022)   Patient History    Smoking Tobacco Use: Never    Smokeless Tobacco Use: Former    Passive Exposure: Not on file  Transportation Needs: No Transportation Needs (09/20/2020)   PRAPARE - Hydrologist (Medical): No    Lack of Transportation (Non-Medical): No    CCM Care Plan  Allergies  Allergen Reactions   Ambien [Zolpidem] Other (See Comments)    Parasomnias, sleep walking   Lisinopril Cough   Gabapentin Other (See Comments)    REACTION: lack of effect for pain   Tramadol Diarrhea    Medications Reviewed Today     Reviewed by Charlton Haws, [facility] (Pharmacist) on 04/10/22 at 1212  Med List  Status: <None>   Medication Order Taking? Sig Documenting Provider Last Dose Status Informant  amLODipine (NORVASC) 5 MG tablet [id number] Yes Take 1 tablet (5 mg total) by mouth every morning. Cantwell, Celeste C, PA-C Taking Active   aspirin 81 MG chewable tablet [id number] Yes Chew 81 mg by mouth at bedtime. [provider] Taking Active Self  atorvastatin (LIPITOR) 40 MG tablet [id number] Yes TAKE ONE TABLET BY MOUTH EVERY MORNING Tonia Ghent, MD Taking Active   Blood Glucose Monitoring Suppl [facility] VERIO) w/Device KIT [id number] Yes Use to check blood sugars TID. Dx: E11.9 Tonia Ghent, MD Taking Active   carvedilol (COREG) 6.25 MG tablet [id number] Yes TAKE ONE TABLET BY MOUTH TWICE DAILY Adrian Prows, MD Taking Active   Cholecalciferol (VITAMIN D3) 50 MCG (2000 UT) capsule [id number] Yes Take 1 capsule (2,000 Units total) by mouth daily. Tonia Ghent, MD Taking Active   Continuous Blood Gluc Receiver (FREESTYLE LIBRE 2 READER) [place] [id number] Yes Use with sensors to monitor sugar continuously Tonia Ghent, MD Taking Active   Continuous Blood Gluc Sensor (FREESTYLE LIBRE 2 SENSOR) [place] [id number] Yes Apply sensor every 14 days to monitor sugar continously Tonia Ghent, MD Taking Active   cyanocobalamin (,VITAMIN B-12,) 1000 MCG/ML injection [id number] Yes Inject 1 mL (1,000 mcg total) into the muscle every 14 (fourteen) days. Tonia Ghent, MD Taking Active   cyclobenzaprine (FLEXERIL) 5 MG tablet [id number] Yes Take 1 tablet (5 mg total) by mouth 3 (three) times daily as needed for muscle spasms (sedation caution). Tonia Ghent, MD Taking Active   diclofenac Sodium (VOLTAREN) 1 % GEL [id number] Yes Apply 2-4 g topically 4 (  four) times daily. Tonia Ghent, MD Taking Active   famotidine (PEPCID) 20 MG tablet [id number] Yes TAKE ONE TABLET BY MOUTH EVERYDAY AT BEDTIME Parrett, Fonnie Mu, NP Taking Active   glimepiride (AMARYL) 4 MG tablet [id number] Yes TAKE ONE TABLET  BY MOUTH TWICE DAILY Tonia Ghent, MD Taking Active   glucose blood [facility] VERIO) test strip [id number] Yes USE TO check blood glucose THREE TIMES DAILY AS DIRECTED Tonia Ghent, MD Taking Active   guaiFENesin-codeine [facility]) 100-10 MG/5ML syrup [id number] Yes Take 5-10 mLs by mouth at bedtime as needed for cough. Jinny Sanders, MD Taking Active   insulin degludec (TRESIBA FLEXTOUCH) 100 UNIT/ML FlexTouch Pen [id number] Yes Inject 10 units daily - increase by 1 unit daily until fasting BG < 130. Tonia Ghent, MD Taking Active   insulin NPH Human (NOVOLIN N) 100 UNIT/ML injection [id number] Yes NovoLIN N [provider] Taking Active   Insulin Pen Needle 32G X 4 MM MISC [id number] Yes Use daily with insulin pen. Tonia Ghent, MD Taking Active Self  Insulin Syringe-Needle U-100 (INSULIN SYRINGE 1CC/31GX5/16") 31G X 5/16" 1 ML MISC [id number] Yes Use with NPH insulin twice daily. Tonia Ghent, MD Taking Active   isosorbide mononitrate (IMDUR) 30 MG 24 hr tablet [id number] Yes TAKE ONE TABLET BY MOUTH EVERYDAY AT BEDTIME Adrian Prows, MD Taking Active   Lancets [facility] ULTRASOFT) lancets [id number] Yes Use to check blood sugars TID. Dx: E11.9 Tonia Ghent, MD Taking Active   losartan (COZAAR) 100 MG tablet [id number] Yes TAKE ONE TABLET BY MOUTH EVERYDAY AT BEDTIME Adrian Prows, MD Taking Active   metFORMIN (GLUCOPHAGE) 500 MG tablet [id number] Yes TAKE ONE TABLET BY MOUTH TWICE DAILY Tonia Ghent, MD Taking Active   nitroGLYCERIN (NITROSTAT) 0.4 MG SL tablet [id number] Yes Place 1 tablet (0.4 mg total) under the tongue every 5 (five) minutes as needed for chest pain. Miquel Dunn, NP Taking Active   ondansetron [facility]) 4 MG tablet [id number] Yes Take 4 mg by mouth every 8 (eight) hours as needed. [provider] Taking Active   oxyCODONE-acetaminophen (PERCOCET/ROXICET) 5-325 MG tablet [id number] Yes Take 1 tablet by mouth 4 (four) times daily. [provider] Taking Active   pantoprazole (PROTONIX) 40 MG tablet [id number] Yes TAKE ONE TABLET BY MOUTH EVERY MORNING Tonia Ghent, MD Taking Active   tamsulosin (FLOMAX) 0.4 MG CAPS capsule [id number] Yes TAKE ONE CAPSULE BY MOUTH EVERY MORNING and TAKE ONE CAPSULE BY MOUTH EVERYDAY AT BEDTIME Tonia Ghent, MD Taking Active             Patient Active Problem List   Diagnosis Date Noted   Acute cough 02/05/2022   Near syncope 12/25/2021   COVID-19 11/14/2021   Advance care planning 06/13/2021   Insomnia 06/13/2021   B12 deficiency 01/30/2021   Fatigue 01/29/2021   Abdominal discomfort 01/05/2021   SOB (shortness of breath) 11/12/2020   Sarcoidosis 11/12/2020   H/O colonoscopy 10/27/2020   Nocturia 05/03/2020   Right sided weakness 03/06/2020   Low back pain 01/29/2020   Facial weakness 11/16/2019   Hematuria 03/22/2018   Renal stones 03/22/2018   Vitamin D deficiency, unspecified 10/04/2017   Chronic neck pain (Primary Area of Pain) (B) (L>R) 09/21/2017   Chronic upper extremity pain (Secondary Area of Pain) (Bilateral) (L>R) 09/21/2017   Chronic left shoulder pain (Tertiary Area of Pain) 09/21/2017   Chronic low back pain (Fourth Area of Pain) (B) (R>L) 09/21/2017  Chronic pain of lower extremity (B) (R>L) 09/21/2017   Other long term (current) drug therapy 09/21/2017   Other specified health status 09/21/2017   Other chronic pain 08/22/2017   Other fatigue 05/10/2017   Radiculopathy 04/07/2017   Angina pectoris ([place]) 10/15/2016   Cervical radiculitis 07/23/2016   Dysphagia 06/12/2016   Sciatica 02/06/2016   Plantar fasciitis 01/16/2016   Right sided sciatica 05/16/2015   Hand pain 04/05/2015   Premature ejaculation 09/17/2014   Heel pain 09/17/2014   NSTEMI (non-ST elevated myocardial infarction) ([place]) 06/13/2014   Atypical chest pain 06/06/2014   Memory change 05/24/2014   Benign paroxysmal positional vertigo 07/13/2013   Orthostasis 07/13/2013    Migraine, unspecified, without mention of intractable migraine without mention of status migrainosus 04/03/2013   Pain in joint, ankle and foot 10/19/2012   Irritable mood 01/18/2012   Neck pain 12/03/2010   Hearing loss 12/03/2010   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   HYPERCHOLESTEROLEMIA 10/20/2010   DEPRESSION 10/20/2010   Osteoarthritis 10/20/2010   ARTHRITIS 10/20/2010   NEPHROLITHIASIS, HX OF 10/20/2010   ESSENTIAL HYPERTENSION, BENIGN 09/30/2010   CORONARY ATHEROSCLEROSIS NATIVE CORONARY ARTERY 09/30/2010   Sleep apnea 09/30/2010   CHEST PAIN UNSPECIFIED 09/30/2010   PERCUTANEOUS TRANSLUMINAL CORONARY ANGIOPLASTY, HX OF 09/30/2010    Immunization History  Administered Date(s) Administered   Influenza Inj Mdck Quad Pf 06/02/2018   Influenza Whole 09/17/2010   Influenza,inj,Quad PF,6+ Mos 06/08/2013, 05/22/2014, 05/12/2016, 05/08/2019, 05/27/2020   Influenza-Unspecified 06/08/2013, 05/22/2014, 06/08/2015, 05/12/2016, 05/21/2017, 06/02/2018, 05/09/2019, 05/21/2021   PFIZER(Purple Top)SARS-COV-2 Vaccination 11/27/2019, 12/25/2019, 08/07/2020, 05/21/2021   Pneumococcal Polysaccharide-23 09/08/2007, 02/16/2013   Td 09/07/2009    Conditions to be addressed/monitored:  Hypertension, Hyperlipidemia, Diabetes, Coronary Artery Disease, and Chronic Kidney Disease  Care Plan : [facility]  Updates made by Charlton Haws, [place] since 04/10/2022 12:00 AM     Problem: Hypertension, Hyperlipidemia, Diabetes, Coronary Artery Disease, and Chronic Kidney Disease   Priority: High     Long-Range Goal: Disease mgmt   Start Date: 11/27/2020  Expected End Date: 11/22/2022  This Visit's Progress: On track  Recent Progress: On track  Priority: High  Note:   Current Barriers:  Uncontrolled diabetes  Pharmacist Clinical Goal(s):  Patient will adhere to plan to optimize therapeutic regimen for diabetes as evidenced by report of adherence to  recommended medication management changes through collaboration with PharmD and provider.   Interventions: 1:1 collaboration with Tonia Ghent, MD regarding development and update of comprehensive plan of care as evidenced by provider attestation and co-signature Inter-disciplinary care team collaboration (see longitudinal plan of care) Comprehensive medication review performed; medication list updated in electronic medical record  Hypertension / CKD Stage 3b (BP goal <130/80) -Controlled - BP at goal in office  -Current treatment: Amodipine 5 mg daily - Appropriate, Effective, Safe, Accessible Carvedilol 6.25 mg BID -Appropriate, Effective, Safe, Accessible Isosorbide MN 30 mg HS -Appropriate, Effective, Safe, Accessible Losartan 100 mg daily -Appropriate, Effective, Safe, Accessible -Medications previously tried: n/a  -Educated on BP goals and benefits of medications for prevention of heart attack, stroke and kidney damage; -Counseled to monitor BP at home periodically, document, and provide log at future appointments -Recommended to continue current medication  Hyperlipidemia / CAD (LDL goal < 70) -Controlled - LDL 59 -Hx CAD. NSTEMI 06/2014 -Current treatment: Atorvastatin 40 mg daily -Appropriate, Effective, Safe, Accessible Aspirin 81 mg daily -Appropriate, Effective, Safe, Accessible Isosorbide MN 30 mg daily -Appropriate, Effective, Safe, Accessible Nitroglycerin 0.4 mg  SL prn -Appropriate, Effective, Safe, Accessible -Medications previously tried: none  -Recommended to continue current medication  Diabetes (A1c goal <7%) -Not ideally controlled - A1c 7.2% (12/2021); pt took last dose of Ozempic 7/30, he cannot afford to refill Ozempic in donut hole (pt was denied PAP earlier this year due to income too high) -He reports he eats 1 meal/day typically late in the day (5-7 pm) and drinks a Cheerwine in the morning "to get going"; reviewed AGP report (below) - BG is elevated  9am-12pm likely due to AM Cheerwine, advised pt to switch to diet or other sugar-free alternative, pt is not sure he will do this -Home BG readings: Freestyle Libre 2 [facility]) -Reviewed AGP report: 03/28/22 to 04/10/22. Sensor active: 62%  Time in range (70-180): 75% (goal > 70%)  High (>180): 25%  Low (< 70): 0% (goal < 4%)  GMI: n/a%; Average glucose: 154  Previous AGP report: 03/03/22 to 03/16/22             Time in range (70-180): 56% (goal > 70%)             High (>180): 43%             Low (< 70): 1% (goal < 4%)             GMI: 7.4%; Average glucose: 171  -Current medications: Glimepiride 4 mg BID - Appropriate, Query Effective, Tresiba 19 units -Appropriate, Query Effective, Metformin 500 mg BID -Appropriate, Query Effective, Ozempic 1 mg weekly - stopped due to cost in donut hole -Medications previously tried: Novolin N 20 u BID -Discussed insulin will need to be increased to compensate for stopping Ozempic; would anticipate he will need 30-40 units of insulin per day after Ozempic washes out -Recommend to increase [place] to 20 units daily; continue to increase by 1 unit daily until fasting BG < 130  Chronic Kidney Disease Stage 3b  -All medications assessed for renal dosing and appropriateness in chronic kidney disease. -Recommended to continue current medication  Chronic pain (Goal: manage symptoms) -Controlled -cervical radiculitis, sciatica, osteoarthritis. Injections from other did not help, going to pain mgmt. -Current treatment  Oxycodone-APAP 5-325 mg Cyclobenzaprine 5 mg TID prn -Medications previously tried: n/a  -Recommended to continue current medication  Health Maintenance -Vaccine gaps: Shingrix  Patient Goals/Self-Care Activities Patient will:  - take medications as prescribed as evidenced by patient report and record review focus on medication adherence by routine check glucose daily, document, and provide at future appointments collaborate with  provider on medication access solutions (Ozempic)     Compliance/Adherence/Medication fill history: Care Gaps: Colonoscopy (due 03/23/2018)  Star-Rating Drugs: Atorvastatin - PDC 100% Glimepiride - PDC 100% Metformin - PDC 100% Losartan - PDC 100%  Medication Assistance:  [place] denied 2023 due to income too high.  Medication Access: Within the past 30 days, how often has patient missed a dose of medication? 0 Is a pillbox or other method used to improve adherence? Yes  Factors that may affect medication adherence? no barriers identified Are meds synced by current pharmacy? Yes  Are meds delivered by current pharmacy? Yes  Does patient experience delays in picking up medications due to transportation concerns? No   [facility] Reviewed: Is patient disadvantaged to use [facility]?: No  Current Rx insurance plan: [employer] Name and location of Current pharmacy:  [facility] - [place], [place] - [address] [employer]  [place] [address] Phone: [phone] Fax: [phone]  [facility] services reviewed with patient today?: Yes  Pill Packs, 30 DS (last delivery 04/07/22) Amlodipine 31m- 1 tablet daily (1 breakfast) Aspirin 876m 1 tablet daily (breakfast) Metformin 50043m tablet twice daily (1 tablet  breakfast, 1 tablet evening meal) Carvedilol 6.68m46m tablet twice daily (1 breakfast, 1 evening meal) Isosorbide 30mg67mtablet daily (1 bedtime) Tamsulosin 0.4mg 278mpsule daily (1 breakfast, 1 bedtime)   Losartan 100 mg- 1 tablet daily- (1 bedtime) Atorvastatin 40mg- 72mblet daily (1 breakfast) Glimepiride 4mg- 1 70mlet twice daily (1 breakfast, 1 evening meal) Pantoprazole 40 mg- 1 tablet daily (1 breakfast)   Vial medications: Ozempic - 1 mg weekly  - DECLINED DUE TO COST Tresiba Flex Inj 100 units - inject 10u daily, inreasing by 1 unit as directed until BG < 130. Max daily dose 20  units  Care Plan and Follow Up Patient Decision:  Patient agrees to Care Plan and Follow-up.  Follow Up Plan: Telephone follow up appointment with care management team member scheduled for: 2 weeks  Chayim Bialas Charlene Brooke, BCACP [facility] Pharmacist [place] [facility]-[phone]

## 2022-04-10 NOTE — Patient Instructions (Signed)
Visit Information  Phone number for Pharmacist: [phone]   Goals Addressed   None     Care Plan : [place]  Updates made by Charlton Haws, RPH since 04/10/2022 12:00 AM     Problem: Hypertension, Hyperlipidemia, Diabetes, Coronary Artery Disease, and Chronic Kidney Disease   Priority: High     Long-Range Goal: Disease mgmt   Start Date: 11/27/2020  Expected End Date: 11/22/2022  This Visit's Progress: On track  Recent Progress: On track  Priority: High  Note:   Current Barriers:  Uncontrolled diabetes  Pharmacist Clinical Goal(s):  Patient will adhere to plan to optimize therapeutic regimen for diabetes as evidenced by report of adherence to recommended medication management changes through collaboration with PharmD and provider.   Interventions: 1:1 collaboration with Tonia Ghent, MD regarding development and update of comprehensive plan of care as evidenced by provider attestation and co-signature Inter-disciplinary care team collaboration (see longitudinal plan of care) Comprehensive medication review performed; medication list updated in electronic medical record  Hypertension / CKD Stage 3b (BP goal <130/80) -Controlled - BP at goal in office  -Current treatment: Amodipine 5 mg daily - Appropriate, Effective, Safe, Accessible Carvedilol 6.25 mg BID -Appropriate, Effective, Safe, Accessible Isosorbide [place] 30 mg HS -Appropriate, Effective, Safe, Accessible Losartan 100 mg daily -Appropriate, Effective, Safe, Accessible -Medications previously tried: n/a  -Educated on BP goals and benefits of medications for prevention of heart attack, stroke and kidney damage; -Counseled to monitor BP at home periodically, document, and provide log at future appointments -Recommended to continue current medication  Hyperlipidemia / CAD (LDL goal < 70) -Controlled - LDL 59 -Hx CAD. NSTEMI 06/2014 -Current treatment: Atorvastatin 40 mg daily -Appropriate,  Effective, Safe, Accessible Aspirin 81 mg daily -Appropriate, Effective, Safe, Accessible Isosorbide [place] 30 mg daily -Appropriate, Effective, Safe, Accessible Nitroglycerin 0.4 mg SL prn -Appropriate, Effective, Safe, Accessible -Medications previously tried: none  -Recommended to continue current medication  Diabetes (A1c goal <7%) -Not ideally controlled - A1c 7.2% (12/2021); pt took last dose of Ozempic 7/30, he cannot afford to refill Ozempic in donut hole (pt was denied PAP earlier this year due to income too high) -He reports he eats 1 meal/day typically late in the day (5-7 pm) and drinks a Cheerwine in the morning "to get going"; reviewed AGP report (below) - BG is elevated 9am-12pm likely due to AM Cheerwine, advised pt to switch to diet or other sugar-free alternative, pt is not sure he will do this -Home BG readings: Freestyle Libre 2 Tony Manning) -Reviewed AGP report: 03/28/22 to 04/10/22. Sensor active: 62%  Time in range (70-180): 75% (goal > 70%)  High (>180): 25%  Low (< 70): 0% (goal < 4%)  GMI: n/a%; Average glucose: 154  Previous AGP report: 03/03/22 to 03/16/22             Time in range (70-180): 56% (goal > 70%)             High (>180): 43%             Low (< 70): 1% (goal < 4%)             GMI: 7.4%; Average glucose: 171  -Current medications: Glimepiride 4 mg BID - Appropriate, Query Effective, Tresiba 19 units -Appropriate, Query Effective, Metformin 500 mg BID -Appropriate, Query Effective, Ozempic 1 mg weekly - stopped due to cost in donut hole -Medications previously tried: Novolin N 20 u BID -Discussed insulin will need to be  increased to compensate for stopping Ozempic; would anticipate he will need 30-40 units of insulin per day after Ozempic washes out -Recommend to increase [place] to 20 units daily; continue to increase by 1 unit daily until fasting BG < 130  Chronic Kidney Disease Stage 3b  -All medications assessed for renal dosing and appropriateness in  chronic kidney disease. -Recommended to continue current medication  Chronic pain (Goal: manage symptoms) -Controlled -cervical radiculitis, sciatica, osteoarthritis. Injections from other did not help, going to pain mgmt. -Current treatment  Oxycodone-APAP 5-325 mg Cyclobenzaprine 5 mg TID prn -Medications previously tried: n/a  -Recommended to continue current medication  Health Maintenance -Vaccine gaps: Shingrix  Patient Goals/Self-Care Activities Patient will:  - take medications as prescribed as evidenced by patient report and record review focus on medication adherence by routine check glucose daily, document, and provide at future appointments collaborate with provider on medication access solutions (Ozempic)      Patient verbalizes understanding of instructions and care plan provided today and agrees to view in [place]. Active MyChart status and patient understanding of how to access instructions and care plan via MyChart confirmed with patient.    Telephone follow up appointment with pharmacy team member scheduled for: 2 weeks  Charlene Brooke, PharmD, [facility] Clinical Pharmacist [facility] at [facility] [phone]

## 2022-04-20 DIAGNOSIS — M542 Cervicalgia: Secondary | ICD-10-CM | POA: Diagnosis not present

## 2022-04-20 DIAGNOSIS — M4322 Fusion of spine, cervical region: Secondary | ICD-10-CM | POA: Diagnosis not present

## 2022-04-21 ENCOUNTER — Telehealth: Payer: Self-pay

## 2022-04-21 ENCOUNTER — Ambulatory Visit: Payer: Medicare HMO | Admitting: Family Medicine

## 2022-04-21 NOTE — Progress Notes (Signed)
Chronic Care Management Pharmacy Assistant   Name: Tony Manning  MRN: [id number] DOB: [birth year]  Reason for Encounter: CCM (Appointment Reminder)  Medications: Outpatient Encounter Medications as of 04/21/2022  Medication Sig   amLODipine (NORVASC) 5 MG tablet Take 1 tablet (5 mg total) by mouth every morning.   aspirin 81 MG chewable tablet Chew 81 mg by mouth at bedtime.   atorvastatin (LIPITOR) 40 MG tablet TAKE ONE TABLET BY MOUTH EVERY MORNING   Blood Glucose Monitoring Suppl (ONETOUCH VERIO) w/Device KIT Use to check blood sugars TID. Dx: E11.9   carvedilol (COREG) 6.25 MG tablet TAKE ONE TABLET BY MOUTH TWICE DAILY   Cholecalciferol (VITAMIN D3) 50 MCG (2000 UT) capsule Take 1 capsule (2,000 Units total) by mouth daily.   Continuous Blood Gluc Receiver (FREESTYLE LIBRE 2 READER) DEVI Use with sensors to monitor sugar continuously   Continuous Blood Gluc Sensor (FREESTYLE LIBRE 2 SENSOR) MISC Apply sensor every 14 days to monitor sugar continously   cyanocobalamin (,VITAMIN B-12,) 1000 MCG/ML injection Inject 1 mL (1,000 mcg total) into the muscle every 14 (fourteen) days.   cyclobenzaprine (FLEXERIL) 5 MG tablet Take 1 tablet (5 mg total) by mouth 3 (three) times daily as needed for muscle spasms (sedation caution).   diclofenac Sodium (VOLTAREN) 1 % GEL Apply 2-4 g topically 4 (four) times daily.   famotidine (PEPCID) 20 MG tablet TAKE ONE TABLET BY MOUTH EVERYDAY AT BEDTIME   glimepiride (AMARYL) 4 MG tablet TAKE ONE TABLET BY MOUTH TWICE DAILY   glucose blood (ONETOUCH VERIO) test strip USE TO check blood glucose THREE TIMES DAILY AS DIRECTED   guaiFENesin-codeine (ROBITUSSIN AC) 100-10 MG/5ML syrup Take 5-10 mLs by mouth at bedtime as needed for cough.   insulin degludec (TRESIBA FLEXTOUCH) 100 UNIT/ML FlexTouch Pen Inject 10 units daily - increase by 1 unit daily until fasting BG < 130.   insulin NPH Human (NOVOLIN N) 100 UNIT/ML injection NovoLIN N   Insulin Pen  Needle 32G X 4 MM MISC Use daily with insulin pen.   Insulin Syringe-Needle U-100 (INSULIN SYRINGE 1CC/31GX5/16") 31G X 5/16" 1 ML MISC Use with NPH insulin twice daily.   isosorbide mononitrate (IMDUR) 30 MG 24 hr tablet TAKE ONE TABLET BY MOUTH EVERYDAY AT BEDTIME   Lancets (ONETOUCH ULTRASOFT) lancets Use to check blood sugars TID. Dx: E11.9   losartan (COZAAR) 100 MG tablet TAKE ONE TABLET BY MOUTH EVERYDAY AT BEDTIME   metFORMIN (GLUCOPHAGE) 500 MG tablet TAKE ONE TABLET BY MOUTH TWICE DAILY   nitroGLYCERIN (NITROSTAT) 0.4 MG SL tablet Place 1 tablet (0.4 mg total) under the tongue every 5 (five) minutes as needed for chest pain.   ondansetron (ZOFRAN) 4 MG tablet Take 4 mg by mouth every 8 (eight) hours as needed.   oxyCODONE-acetaminophen (PERCOCET/ROXICET) 5-325 MG tablet Take 1 tablet by mouth 4 (four) times daily.   pantoprazole (PROTONIX) 40 MG tablet TAKE ONE TABLET BY MOUTH EVERY MORNING   tamsulosin (FLOMAX) 0.4 MG CAPS capsule TAKE ONE CAPSULE BY MOUTH EVERY MORNING and TAKE ONE CAPSULE BY MOUTH EVERYDAY AT BEDTIME   No facility-administered encounter medications on file as of 04/21/2022.   Tony Manning was contacted to remind of upcoming telephone visit with Tony Manning on 04/24/22 at 11:45. Patient was reminded to have any blood glucose and blood pressure readings available for review at appointment.   Message was left reminding patient of appointment.  CCM referral has been placed prior to visit?  No  Star Rating Drugs: Medication:  Last Fill: Day Supply Atorvastatin 40 mg 04/01/22 30 Glimepiride 4 mg 04/01/22 30 Metformin 500 mg 04/01/22 30 Losartan 100 mg 04/01/22 [place], CPP notified  Marijean Niemann, [place] Pharmacy Assistant [phone]

## 2022-04-22 ENCOUNTER — Telehealth: Payer: Medicare HMO

## 2022-04-23 ENCOUNTER — Other Ambulatory Visit: Payer: Self-pay | Admitting: Family Medicine

## 2022-04-24 ENCOUNTER — Ambulatory Visit: Payer: Medicare HMO | Admitting: Pharmacist

## 2022-04-24 ENCOUNTER — Telehealth: Payer: Self-pay | Admitting: Pharmacist

## 2022-04-24 DIAGNOSIS — E1149 Type 2 diabetes mellitus with other diabetic neurological complication: Secondary | ICD-10-CM

## 2022-04-24 DIAGNOSIS — E7800 Pure hypercholesterolemia, unspecified: Secondary | ICD-10-CM

## 2022-04-24 DIAGNOSIS — I10 Essential (primary) hypertension: Secondary | ICD-10-CM

## 2022-04-24 MED ORDER — SOLIQUA 100-33 UNT-MCG/ML ~~LOC~~ SOPN: 30.0000 [IU] | PEN_INJECTOR | Freq: Every day | SUBCUTANEOUS | 0 refills | Status: DC

## 2022-04-24 NOTE — Patient Instructions (Signed)
Visit Information  Phone number for Pharmacist: [phone]   Goals Addressed   None     Care Plan : [place]  Updates made by Charlton Haws, RPH since 04/24/2022 12:00 AM     Problem: Hypertension, Hyperlipidemia, Diabetes, Coronary Artery Disease, and Chronic Kidney Disease   Priority: High     Long-Range Goal: Disease mgmt   Start Date: 11/27/2020  Expected End Date: 11/22/2022  This Visit's Progress: On track  Recent Progress: On track  Priority: High  Note:   Current Barriers:  Uncontrolled diabetes  Pharmacist Clinical Goal(s):  Patient will adhere to plan to optimize therapeutic regimen for diabetes as evidenced by report of adherence to recommended medication management changes through collaboration with PharmD and provider.   Interventions: 1:1 collaboration with Tonia Ghent, MD regarding development and update of comprehensive plan of care as evidenced by provider attestation and co-signature Inter-disciplinary care team collaboration (see longitudinal plan of care) Comprehensive medication review performed; medication list updated in electronic medical record  Hypertension / CKD Stage 3b (BP goal <130/80) -Controlled - BP at goal in office  -Current treatment: Amodipine 5 mg daily - Appropriate, Effective, Safe, Accessible Carvedilol 6.25 mg BID -Appropriate, Effective, Safe, Accessible Isosorbide MN 30 mg HS -Appropriate, Effective, Safe, Accessible Losartan 100 mg daily -Appropriate, Effective, Safe, Accessible -Medications previously tried: n/a  -Educated on BP goals and benefits of medications for prevention of heart attack, stroke and kidney damage; -Counseled to monitor BP at home periodically, document, and provide log at future appointments -Recommended to continue current medication  Hyperlipidemia / CAD (LDL goal < 70) -Controlled - LDL 59 -Hx CAD. NSTEMI 06/2014 -Current treatment: Atorvastatin 40 mg daily -Appropriate,  Effective, Safe, Accessible Aspirin 81 mg daily -Appropriate, Effective, Safe, Accessible Isosorbide MN 30 mg daily -Appropriate, Effective, Safe, Accessible Nitroglycerin 0.4 mg SL prn -Appropriate, Effective, Safe, Accessible -Medications previously tried: none  -Recommended to continue current medication  Diabetes (A1c goal <7%) -Not ideally controlled - A1c 7.2% (12/2021); pt took last dose of Ozempic 7/30, he cannot afford to refill Ozempic in donut hole (pt was denied PAP earlier this year due to income too high); he has now titrated [place] up to 35 units daily -He reports he eats 1 meal/day typically late in the day (5-7 pm) and drinks a Cheerwine in the morning "to get going"; reviewed AGP report (below) - BG is elevated 9am-12pm likely due to AM Cheerwine, advised pt to switch to diet or other sugar-free alternative, pt is not sure he will do this -Home BG readings: Freestyle Libre 2 [facility]) -Reviewed AGP report: 04/11/22 to 04/24/22. Sensor active: 71%  Time in range (70-180): 55% (goal > 70%)  High (>180): 45%  Low (< 70): 0% (goal < 4%)  GMI: 7.4%; Average glucose: 173  -Previous AGP report: 03/28/22 to 04/10/22. Sensor active: 62%  Time in range (70-180): 75% (goal > 70%)  High (>180): 25%  Low (< 70): 0% (goal < 4%)  GMI: n/a%; Average glucose: 154   -Current medications: Glimepiride 4 mg BID - Appropriate, Query Effective, Tresiba 35 units -Appropriate, Query Effective, Metformin 500 mg BID -Appropriate, Query Effective, Ozempic 1 mg weekly - stopped due to cost in donut hole -Medications previously tried: Novolin N 20 u BID -Discussed combo insulin-GLP-1 RA products are covered under his plan as part of the $35 insulin program (not subject to donut hole) -Recommend to switch [place] to [place] 30 units daily - see phone note  Chronic Kidney Disease Stage 3b  -All medications assessed for renal dosing and appropriateness in chronic kidney disease. -Recommended to continue  current medication  Chronic pain (Goal: manage symptoms) -Controlled -cervical radiculitis, sciatica, osteoarthritis. Injections from other did not help, going to pain mgmt. -Current treatment  Oxycodone-APAP 5-325 mg Cyclobenzaprine 5 mg TID prn -Medications previously tried: n/a  -Recommended to continue current medication  Health Maintenance -Vaccine gaps: Shingrix -Advised to start B12 1000 mcg daily and Vitamin D 1000 IU daily  Patient Goals/Self-Care Activities Patient will:  - take medications as prescribed as evidenced by patient report and record review focus on medication adherence by routine check glucose daily, document, and provide at future appointments      Patient verbalizes understanding of instructions and care plan provided today and agrees to view in [place]. Active MyChart status and patient understanding of how to access instructions and care plan via MyChart confirmed with patient.    Telephone follow up appointment with pharmacy team member scheduled for: 2 weeks  Charlene Brooke, PharmD, [facility] Clinical Pharmacist [facility] at [facility] [phone]

## 2022-04-24 NOTE — Progress Notes (Signed)
Chronic Care Management Pharmacy Note  Name:  Tony Manning   MRN :  [id number] DOB:  1960/12/26  Summary: CCM F/U visit -DM: Pt had to stop Ozempic ~3 weeks ago due to high cost in donut hole; sugars have worsened and pt has titrated Tresiba up to 35 units, but CGM data has still worsened -Reviewed AGP report: 04/11/22 to 04/24/22. Sensor active: 71%  Time in range (70-180): 55% (goal > 70%)  High (>180): 45%  Low (< 70): 0% (goal < 4%)  GMI: 7.4%; Average glucose: 173  Recommendations:  -Switch Tresiba to [place] 30 units daily ($35 with insurance, not subject to donut hole)  Plan:  -Pharmacist follow up televisit scheduled for 2 weeks -PCP visit 04/02/22 - pt cancelled and needs to reschedule    Subjective: Tony Manning is an 61 y.o. year old male who is a primary patient of [name], Elveria Rising, MD.  The CCM team was consulted for assistance with disease management and care coordination needs.    Engaged with patient by telephone for follow up visit in response to provider referral for pharmacy case management and/or care coordination services.   Consent to Services:  The patient was given information about Chronic Care Management services, agreed to services, and gave verbal consent prior to initiation of services.  Please see initial visit note for detailed documentation.   Patient Care Team: Tonia Ghent, MD as PCP - General (Family Medicine) Adrian Prows, MD as Consulting Physician (Cardiology) Charlton Haws, [facility] as Pharmacist (Pharmacist)  Recent office visits: 02/05/22 Dr Diona Browner VV: acute cough - rx'd zpak, codeine cough syrup. 01/09/22 Dr [name] OV: f/u - SOB. Take lasix for weight > 208#. Can try Lyrica 1 AM, 2 PM.  11/19/21 NP Eugenia Pancoast VV: covid-19. Rx'd Augmentin. 11/14/20 NP Eugenia Pancoast VV: covid-19. Rx'd Lagevrio. 09/05/2021 - [name], MD - Patient presented for Covid positive. Start: Lagevrio. Stop due to patient not taking:  oxycodone 06/09/2021 - [name], MD - Patient presented for [facility]. Labs: CMP, A1c, CBC, Lipid, PSA and Vit B. Foot exam completed. Ran out of Ozempic, will restart. A1c stable 7.2%, Kidney function declined, refer to renal clinic. Administered: cyanocobalamin ((VITAMIN B-12)) injection 1,000 mcg. No medication changes. Follow up 3 months.    Recent consult visits:  02/16/22 NP Tammy Parrett (pulmonary): f/u Sarcoid. D/c pregabalin. D/c lasix, Kcl. Ordered PFTs.  01/05/22 PA Lawerance Cruel (cardiology): SOB/edema. Rx'd furosemide 20 mg and Kcl 10 mEq. Reduced amlodipine to 5 mg.  10/02/21 Dr Vikki Ports (psychology): chronic pain  - stable for spinal cord stimulator trial   Hospital visits:  None in previous 6 months  Objective:  Lab Results  Component Value Date   CREATININE 1.56 (H) 01/09/2022   BUN 19 01/09/2022   GFR 47.53 (L) 01/09/2022   GFRNONAA 54 (L) 11/12/2020   GFRAA 42 (L) 05/05/2019   NA 140 01/09/2022   K 4.9 01/09/2022   CALCIUM 9.6 01/09/2022   CO2 29 01/09/2022   GLUCOSE 156 (H) 01/09/2022    Lab Results  Component Value Date/Time   HGBA1C 7.2 (H) 12/23/2021 11:47 AM   HGBA1C 7.2 (H) 06/09/2021 09:23 AM   GFR 47.53 (L) 01/09/2022 12:05 PM   GFR 60.28 12/23/2021 11:47 AM   MICROALBUR 1.5 08/15/2012 09:05 AM    Last diabetic Eye exam:  Lab Results  Component Value Date/Time   HMDIABEYEEXA Retinopathy (A) 01/13/2021 12:00 AM    Last diabetic Foot exam:  10/25/20  normal   Lab Results  Component Value Date   CHOL 112 06/09/2021   HDL 40.20 06/09/2021   LDLCALC 57 06/09/2021   TRIG 73.0 06/09/2021   CHOLHDL 3 06/09/2021       Latest Ref Rng & Units 06/09/2021    9:23 AM 01/27/2021   12:40 PM 03/05/2020    2:46 PM  Hepatic Function  Total Protein 6.0 - 8.3 g/dL 6.8  7.5  7.5   Albumin 3.5 - 5.2 g/dL 4.3  4.8  4.6   AST 0 - 37 U/L _0 ALT 0 - 53 U/L _1 Alk Phosphatase 39 - 117 U/L 90  78  67   Total Bilirubin 0.2 -  1.2 mg/dL 0.5  0.6  0.5   Bilirubin, Direct 0.0 - 0.3 mg/dL  0.1      Lab Results  Component Value Date/Time   TSH 1.34 01/27/2021 12:40 PM   TSH 2.22 03/21/2018 12:05 PM       Latest Ref Rng & Units 12/23/2021   11:47 AM 06/09/2021    9:23 AM 01/27/2021   12:40 PM  CBC  WBC 4.0 - 10.5 K/uL 5.9  7.2  7.9   Hemoglobin 13.0 - 17.0 g/dL 13.4  13.7  14.0   Hematocrit 39.0 - 52.0 % 39.6  40.9  40.6   Platelets 150.0 - 400.0 K/uL 193.0  198.0  223.0     Lab Results  Component Value Date/Time   VD25OH 23.11 (L) 12/23/2021 11:47 AM   VD25OH 19.37 (L) 03/21/2018 12:05 PM    Clinical ASCVD: Yes  The ASCVD Risk score (Arnett DK, et al., 2019) failed to calculate for the following reasons:   The patient has a prior MI or stroke diagnosis       12/23/2021   11:03 AM 09/20/2020    2:06 PM 04/21/2018    9:57 AM  Depression screen PHQ 2/9  Decreased Interest 0 0 0  Down, Depressed, Hopeless 0 0 0  PHQ - 2 Score 0 0 0  Altered sleeping  0   Tired, decreased energy  0   Change in appetite  0   Feeling bad or failure about yourself   0   Trouble concentrating  0   Moving slowly or fidgety/restless  0   Suicidal thoughts  0   PHQ-9 Score  0   Difficult doing work/chores  Not difficult at all     Social History   Tobacco Use  Smoking Status Never  Smokeless Tobacco Former   Types: Chew   Quit date: 02/06/2016   BP Readings from Last 3 Encounters:  02/16/22 114/64  02/05/22 124/72  01/09/22 120/60   Pulse Readings from Last 3 Encounters:  02/16/22 74  01/09/22 60  01/05/22 60   Wt Readings from Last 3 Encounters:  02/16/22 195 lb 9.6 oz (88.7 kg)  02/05/22 190 lb (86.2 kg)  01/09/22 208 lb (94.3 kg)   BMI Readings from Last 3 Encounters:  02/16/22 28.07 kg/m  02/05/22 27.26 kg/m  01/09/22 29.84 kg/m    Assessment/Interventions: Review of patient past medical history, allergies, medications, health status, including review of consultants reports, laboratory and  other test data, was performed as part of comprehensive evaluation and provision of chronic care management services.   SDOH:  (Social Determinants of Health) assessments and interventions performed: No - done 11/2021   SDOH Screenings   Alcohol Screen: Low Risk  (  09/20/2020)   Alcohol Screen    Last Alcohol Screening Score (AUDIT): 0  Depression (PHQ2-9): Low Risk  (12/23/2021)   Depression (PHQ2-9)    PHQ-2 Score: 0  Financial Resource Strain: Medium Risk (11/21/2021)   Overall Financial Resource Strain (CARDIA)    Difficulty of Paying Living Expenses: Somewhat hard  Food Insecurity: No Food Insecurity (11/21/2021)   Hunger Vital Sign    Worried About Running Out of Food in the Last Year: Never true    Ran Out of Food in the Last Year: Never true  Housing: Low Risk  (09/20/2020)   Housing    Last Housing Risk Score: 0  Physical Activity: Inactive (09/20/2020)   Exercise Vital Sign    Days of Exercise per Week: 0 days    Minutes of Exercise per Session: 0 min  Social Connections: Not on file  Stress: No Stress Concern Present (09/20/2020)   [place]    Feeling of Stress : Not at all  Tobacco Use: Medium Risk (02/16/2022)   Patient History    Smoking Tobacco Use: Never    Smokeless Tobacco Use: Former    Passive Exposure: Not on file  Transportation Needs: No Transportation Needs (09/20/2020)   PRAPARE - Hydrologist (Medical): No    Lack of Transportation (Non-Medical): No    CCM Care Plan  Allergies  Allergen Reactions   Ambien [Zolpidem] Other (See Comments)    Parasomnias, sleep walking   Lisinopril Cough   Gabapentin Other (See Comments)    REACTION: lack of effect for pain   Tramadol Diarrhea    Medications Reviewed Today     Reviewed by Charlton Haws, [facility] (Pharmacist) on 04/10/22 at 1212  Med List Status: <None>   Medication Order Taking? Sig Documenting  Provider Last Dose Status Informant  amLODipine (NORVASC) 5 MG tablet [id number] Yes Take 1 tablet (5 mg total) by mouth every morning. Cantwell, Celeste C, PA-C Taking Active   aspirin 81 MG chewable tablet [id number] Yes Chew 81 mg by mouth at bedtime. [provider] Taking Active Self  atorvastatin (LIPITOR) 40 MG tablet [id number] Yes TAKE ONE TABLET BY MOUTH EVERY MORNING Tonia Ghent, MD Taking Active   Blood Glucose Monitoring Suppl [facility] VERIO) w/Device KIT [id number] Yes Use to check blood sugars TID. Dx: E11.9 Tonia Ghent, MD Taking Active   carvedilol (COREG) 6.25 MG tablet [id number] Yes TAKE ONE TABLET BY MOUTH TWICE DAILY Adrian Prows, MD Taking Active   Cholecalciferol (VITAMIN D3) 50 MCG (2000 UT) capsule [id number] Yes Take 1 capsule (2,000 Units total) by mouth daily. Tonia Ghent, MD Taking Active   Continuous Blood Gluc Receiver (FREESTYLE LIBRE 2 READER) [place] [id number] Yes Use with sensors to monitor sugar continuously Tonia Ghent, MD Taking Active   Continuous Blood Gluc Sensor (FREESTYLE LIBRE 2 SENSOR) [place] [id number] Yes Apply sensor every 14 days to monitor sugar continously Tonia Ghent, MD Taking Active   cyanocobalamin (,VITAMIN B-12,) 1000 MCG/ML injection [id number] Yes Inject 1 mL (1,000 mcg total) into the muscle every 14 (fourteen) days. Tonia Ghent, MD Taking Active   cyclobenzaprine (FLEXERIL) 5 MG tablet [id number] Yes Take 1 tablet (5 mg total) by mouth 3 (three) times daily as needed for muscle spasms (sedation caution). Tonia Ghent, MD Taking Active   diclofenac Sodium (VOLTAREN) 1 % GEL [id number] Yes Apply 2-4 g topically 4 (four) times daily. [name]  S, MD Taking Active   famotidine (PEPCID) 20 MG tablet [id number] Yes TAKE ONE TABLET BY MOUTH EVERYDAY AT BEDTIME Parrett, Tammy S, NP Taking Active   glimepiride (AMARYL) 4 MG tablet [id number] Yes TAKE ONE TABLET BY MOUTH TWICE DAILY Tonia Ghent, MD Taking Active    glucose blood [facility] VERIO) test strip [id number] Yes USE TO check blood glucose THREE TIMES DAILY AS DIRECTED Tonia Ghent, MD Taking Active   guaiFENesin-codeine [facility]) 100-10 MG/5ML syrup [id number] Yes Take 5-10 mLs by mouth at bedtime as needed for cough. Jinny Sanders, MD Taking Active   insulin degludec (TRESIBA FLEXTOUCH) 100 UNIT/ML FlexTouch Pen [id number] Yes Inject 10 units daily - increase by 1 unit daily until fasting BG < 130. Tonia Ghent, MD Taking Active   insulin NPH Human (NOVOLIN N) 100 UNIT/ML injection [id number] Yes NovoLIN N [provider] Taking Active   Insulin Pen Needle 32G X 4 MM MISC [id number] Yes Use daily with insulin pen. Tonia Ghent, MD Taking Active Self  Insulin Syringe-Needle U-100 (INSULIN SYRINGE 1CC/31GX5/16") 31G X 5/16" 1 ML MISC [id number] Yes Use with NPH insulin twice daily. Tonia Ghent, MD Taking Active   isosorbide mononitrate (IMDUR) 30 MG 24 hr tablet [id number] Yes TAKE ONE TABLET BY MOUTH EVERYDAY AT BEDTIME Adrian Prows, MD Taking Active   Lancets [facility] ULTRASOFT) lancets [id number] Yes Use to check blood sugars TID. Dx: E11.9 Tonia Ghent, MD Taking Active   losartan (COZAAR) 100 MG tablet [id number] Yes TAKE ONE TABLET BY MOUTH EVERYDAY AT BEDTIME Adrian Prows, MD Taking Active   metFORMIN (GLUCOPHAGE) 500 MG tablet [id number] Yes TAKE ONE TABLET BY MOUTH TWICE DAILY Tonia Ghent, MD Taking Active   nitroGLYCERIN (NITROSTAT) 0.4 MG SL tablet [id number] Yes Place 1 tablet (0.4 mg total) under the tongue every 5 (five) minutes as needed for chest pain. Miquel Dunn, NP Taking Active   ondansetron [facility]) 4 MG tablet [id number] Yes Take 4 mg by mouth every 8 (eight) hours as needed. [provider] Taking Active   oxyCODONE-acetaminophen (PERCOCET/ROXICET) 5-325 MG tablet [id number] Yes Take 1 tablet by mouth 4 (four) times daily. [provider] Taking Active   pantoprazole (PROTONIX)  40 MG tablet [id number] Yes TAKE ONE TABLET BY MOUTH EVERY MORNING Tonia Ghent, MD Taking Active   tamsulosin (FLOMAX) 0.4 MG CAPS capsule [id number] Yes TAKE ONE CAPSULE BY MOUTH EVERY MORNING and TAKE ONE CAPSULE BY MOUTH EVERYDAY AT BEDTIME Tonia Ghent, MD Taking Active             Patient Active Problem List   Diagnosis Date Noted   Acute cough 02/05/2022   Near syncope 12/25/2021   COVID-19 11/14/2021   Advance care planning 06/13/2021   Insomnia 06/13/2021   B12 deficiency 01/30/2021   Fatigue 01/29/2021   Abdominal discomfort 01/05/2021   SOB (shortness of breath) 11/12/2020   Sarcoidosis 11/12/2020   H/O colonoscopy 10/27/2020   Nocturia 05/03/2020   Right sided weakness 03/06/2020   Low back pain 01/29/2020   Facial weakness 11/16/2019   Hematuria 03/22/2018   Renal stones 03/22/2018   Vitamin D deficiency, unspecified 10/04/2017   Chronic neck pain (Primary Area of Pain) (B) (L>R) 09/21/2017   Chronic upper extremity pain (Secondary Area of Pain) (Bilateral) (L>R) 09/21/2017   Chronic left shoulder pain (Tertiary Area of Pain) 09/21/2017   Chronic low back pain (Fourth Area of Pain) (B) (R>L) 09/21/2017   Chronic pain of  lower extremity (B) (R>L) 09/21/2017   Other long term (current) drug therapy 09/21/2017   Other specified health status 09/21/2017   Other chronic pain 08/22/2017   Other fatigue 05/10/2017   Radiculopathy 04/07/2017   Angina pectoris ([place]) 10/15/2016   Cervical radiculitis 07/23/2016   Dysphagia 06/12/2016   Sciatica 02/06/2016   Plantar fasciitis 01/16/2016   Right sided sciatica 05/16/2015   Hand pain 04/05/2015   Premature ejaculation 09/17/2014   Heel pain 09/17/2014   NSTEMI (non-ST elevated myocardial infarction) ([place]) 06/13/2014   Atypical chest pain 06/06/2014   Memory change 05/24/2014   Benign paroxysmal positional vertigo 07/13/2013   Orthostasis 07/13/2013   Migraine, unspecified, without mention of intractable  migraine without mention of status migrainosus 04/03/2013   Pain in joint, ankle and foot 10/19/2012   Irritable mood 01/18/2012   Neck pain 12/03/2010   Hearing loss 12/03/2010   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/2010   HYPERCHOLESTEROLEMIA 10/20/2010   DEPRESSION 10/20/2010   Osteoarthritis 10/20/2010   ARTHRITIS 10/20/2010   NEPHROLITHIASIS, HX OF 10/20/2010   ESSENTIAL HYPERTENSION, BENIGN 09/30/2010   CORONARY ATHEROSCLEROSIS NATIVE CORONARY ARTERY 09/30/2010   Sleep apnea 09/30/2010   CHEST PAIN UNSPECIFIED 09/30/2010   PERCUTANEOUS TRANSLUMINAL CORONARY ANGIOPLASTY, HX OF 09/30/2010    Immunization History  Administered Date(s) Administered   Influenza Inj Mdck Quad Pf 06/02/2018   Influenza Whole 09/17/2010   Influenza,inj,Quad PF,6+ Mos 06/08/2013, 05/22/2014, 05/12/2016, 05/08/2019, 05/27/2020   Influenza-Unspecified 06/08/2013, 05/22/2014, 06/08/2015, 05/12/2016, 05/21/2017, 06/02/2018, 05/09/2019, 05/21/2021   PFIZER(Purple Top)SARS-COV-2 Vaccination 11/27/2019, 12/25/2019, 08/07/2020, 05/21/2021   Pneumococcal Polysaccharide-23 09/08/2007, 02/16/2013   Td 09/07/2009    Conditions to be addressed/monitored:  Hypertension, Hyperlipidemia, Diabetes, Coronary Artery Disease, and Chronic Kidney Disease  Care Plan : [place]  Updates made by Charlton Haws, [place] since 04/24/2022 12:00 AM     Problem: Hypertension, Hyperlipidemia, Diabetes, Coronary Artery Disease, and Chronic Kidney Disease   Priority: High     Long-Range Goal: Disease mgmt   Start Date: 11/27/2020  Expected End Date: 11/22/2022  This Visit's Progress: On track  Recent Progress: On track  Priority: High  Note:   Current Barriers:  Uncontrolled diabetes  Pharmacist Clinical Goal(s):  Patient will adhere to plan to optimize therapeutic regimen for diabetes as evidenced by report of adherence to recommended medication management changes through  collaboration with PharmD and provider.   Interventions: 1:1 collaboration with Tonia Ghent, MD regarding development and update of comprehensive plan of care as evidenced by provider attestation and co-signature Inter-disciplinary care team collaboration (see longitudinal plan of care) Comprehensive medication review performed; medication list updated in electronic medical record  Hypertension / CKD Stage 3b (BP goal <130/80) -Controlled - BP at goal in office  -Current treatment: Amodipine 5 mg daily - Appropriate, Effective, Safe, Accessible Carvedilol 6.25 mg BID -Appropriate, Effective, Safe, Accessible Isosorbide [place] 30 mg HS -Appropriate, Effective, Safe, Accessible Losartan 100 mg daily -Appropriate, Effective, Safe, Accessible -Medications previously tried: n/a  -Educated on BP goals and benefits of medications for prevention of heart attack, stroke and kidney damage; -Counseled to monitor BP at home periodically, document, and provide log at future appointments -Recommended to continue current medication  Hyperlipidemia / CAD (LDL goal < 70) -Controlled - LDL 59 -Hx CAD. NSTEMI 06/2014 -Current treatment: Atorvastatin 40 mg daily -Appropriate, Effective, Safe, Accessible Aspirin 81 mg daily -Appropriate, Effective, Safe, Accessible Isosorbide [place] 30 mg daily -Appropriate, Effective, Safe, Accessible Nitroglycerin 0.4 mg SL prn -Appropriate,  Effective, Safe, Accessible -Medications previously tried: none  -Recommended to continue current medication  Diabetes (A1c goal <7%) -Not ideally controlled - A1c 7.2% (12/2021); pt took last dose of Ozempic 7/30, he cannot afford to refill Ozempic in donut hole (pt was denied PAP earlier this year due to income too high); he has now titrated [place] up to 35 units daily -He reports he eats 1 meal/day typically late in the day (5-7 pm) and drinks a Cheerwine in the morning "to get going"; reviewed AGP report (below) - BG is elevated  9am-12pm likely due to AM Cheerwine, advised pt to switch to diet or other sugar-free alternative, pt is not sure he will do this -Home BG readings: Freestyle Libre 2 [facility]) -Reviewed AGP report: 04/11/22 to 04/24/22. Sensor active: 71%  Time in range (70-180): 55% (goal > 70%)  High (>180): 45%  Low (< 70): 0% (goal < 4%)  GMI: 7.4%; Average glucose: 173  -Previous AGP report: 03/28/22 to 04/10/22. Sensor active: 62%  Time in range (70-180): 75% (goal > 70%)  High (>180): 25%  Low (< 70): 0% (goal < 4%)  GMI: n/a%; Average glucose: 154   -Current medications: Glimepiride 4 mg BID - Appropriate, Query Effective, Tresiba 35 units -Appropriate, Query Effective, Metformin 500 mg BID -Appropriate, Query Effective, Ozempic 1 mg weekly - stopped due to cost in donut hole -Medications previously tried: Novolin N 20 u BID -Discussed combo insulin-GLP-1 RA products are covered under his plan as part of the $35 insulin program (not subject to donut hole) -Recommend to switch [place] to [place] 30 units daily - see phone note  Chronic Kidney Disease Stage 3b  -All medications assessed for renal dosing and appropriateness in chronic kidney disease. -Recommended to continue current medication  Chronic pain (Goal: manage symptoms) -Controlled -cervical radiculitis, sciatica, osteoarthritis. Injections from other did not help, going to pain mgmt. -Current treatment  Oxycodone-APAP 5-325 mg Cyclobenzaprine 5 mg TID prn -Medications previously tried: n/a  -Recommended to continue current medication  Health Maintenance -Vaccine gaps: Shingrix -Advised to start B12 1000 mcg daily and Vitamin D 1000 IU daily  Patient Goals/Self-Care Activities Patient will:  - take medications as prescribed as evidenced by patient report and record review focus on medication adherence by routine check glucose daily, document, and provide at future appointments      Compliance/Adherence/Medication fill  history: Care Gaps: Colonoscopy (due 03/23/2018)  Star-Rating Drugs: Atorvastatin - PDC 100% Glimepiride - PDC 100% Metformin - PDC 100% Losartan - PDC 100%  Medication Assistance:  [place] denied 2023 due to income too high.  Medication Access: Within the past 30 days, how often has patient missed a dose of medication? 0 Is a pillbox or other method used to improve adherence? Yes  Factors that may affect medication adherence? no barriers identified Are meds synced by current pharmacy? Yes  Are meds delivered by current pharmacy? Yes  Does patient experience delays in picking up medications due to transportation concerns? No   [facility] Reviewed: Is patient disadvantaged to use [facility]?: No  Current Rx insurance plan: [employer] [place] Name and location of Current pharmacy:  [facility] - [place], [place] - [place] [address] [place] [address] Phone: [phone] Fax: [phone]  [facility] services reviewed with patient today?: Yes  Pill Packs, 30 DS (last delivery 04/07/22) Amlodipine 67m- 1 tablet daily (1 breakfast) Aspirin 851m 1 tablet daily (breakfast) Metformin 50047m  tablet twice daily (1 tablet  breakfast, 1 tablet evening meal) Carvedilol 6.2m- 1 tablet twice daily (1 breakfast, 1 evening meal) Isosorbide 325m 1 tablet daily (1 bedtime) Tamsulosin 0.9m80m capsule daily (1 breakfast, 1 bedtime)   Losartan 100 mg- 1 tablet daily- (1 bedtime) Atorvastatin 52m70m tablet daily (1 breakfast) Glimepiride 9mg-38mtablet twice daily (1 breakfast, 1 evening meal) Pantoprazole 40 mg- 1 tablet daily (1 breakfast)   Declined medications:  Tresiba Flex Inj 100 units - [facility] Patient Decision:  Patient agrees to Care Plan and Follow-up.  Follow Up Plan: Telephone follow up appointment with care management team member scheduled for: 2  weeks  LindsCharlene BrookermD, BCACPSutter Maternity And Surgery Center Of Santa Cruzical Pharmacist [phone]

## 2022-04-24 NOTE — Telephone Encounter (Signed)
Patient had to stop taking Ozempic due to high cost in donut hole, he did not qualify for PAP due to income too high. His post-prandial sugar has worsened since stopping Ozempic ~3 weeks ago. He has titrated Tresiba up to 35 units but still sugars remain above 180 almost half the time.  Patient would benefit from a combination insulin-GLP-1 RA product, and his insurance covers this under the $35 insulin program so he can afford it.  Recommend to switch [place] to [facility] 30 units daily   Reviewed AGP report: 04/11/22 to 04/24/22. Sensor active: 71%  Time in range (70-180): 55% (goal > 70%)  High (>180): 45%  Low (< 70): 0% (goal < 4%)  GMI: 7.4%; Average glucose: 173

## 2022-04-26 NOTE — Telephone Encounter (Signed)
Agree. Thanks

## 2022-04-27 DIAGNOSIS — M47812 Spondylosis without myelopathy or radiculopathy, cervical region: Secondary | ICD-10-CM | POA: Diagnosis not present

## 2022-04-27 DIAGNOSIS — M542 Cervicalgia: Secondary | ICD-10-CM | POA: Diagnosis not present

## 2022-04-28 ENCOUNTER — Telehealth: Payer: Self-pay

## 2022-04-28 NOTE — Progress Notes (Signed)
    Chronic Care Management Pharmacy Assistant   Name: DORSEL FLINN  MRN: [id number] DOB: [birth year]  Reason for Encounter: CCM (Eye Exam)   Contacted patient in regards to updated eye exam information. Patient states he has an eye exam scheduled for September 8 at [facility] in [place].   Charlene Brooke, CPP notified  Marijean Niemann, [place] Clinical Pharmacy Assistant [phone]

## 2022-04-30 ENCOUNTER — Ambulatory Visit: Payer: Self-pay

## 2022-04-30 NOTE — Patient Outreach (Signed)
  Care Coordination   04/30/2022 Name: Tony Manning MRN: [id number] DOB: 04-23-1961   Care Coordination Outreach Attempts:  An unsuccessful telephone outreach was attempted today to offer the patient information about available care coordination services as a benefit of their health plan.   Follow Up Plan:  Additional outreach attempts will be made to offer the patient care coordination information and services.   Encounter Outcome:  No Answer  Care Coordination Interventions Activated:  No   Care Coordination Interventions:  No, not indicated    Quinn Plowman RN,BSN,CCM RN Care Manager Coordinator [phone]

## 2022-05-01 DIAGNOSIS — Z794 Long term (current) use of insulin: Secondary | ICD-10-CM | POA: Diagnosis not present

## 2022-05-01 DIAGNOSIS — E1149 Type 2 diabetes mellitus with other diabetic neurological complication: Secondary | ICD-10-CM | POA: Diagnosis not present

## 2022-05-05 ENCOUNTER — Telehealth: Payer: Self-pay

## 2022-05-05 NOTE — Progress Notes (Unsigned)
Primary Physician/Referring:  Tonia Ghent, MD  Patient ID: Tony Manning, male    DOB: 1961-03-08, 61 y.o.   MRN: [id number]  No chief complaint on file.   HPI: Tony Manning  is a 61 y.o. male with coronary artery disease status post PCI to RCA and LAD, long-standing degenerative disc disease and involving neck and shoulders, sciatica, hyperlipidemia, OSA on CPAP, uncontrolled DM, hypertension.   ***  Past Medical History:  Diagnosis Date   Anginal pain ([place])    Basal cell carcinoma of face    Chest pain, unspecified    Chronic kidney disease    kidney stones   Coronary atherosclerosis of native coronary artery    s/p stent x5   Depression    Diabetes mellitus without mention of complication    Diverticulosis    Dyspnea    Dysrhythmia    Erectile dysfunction    Essential hypertension, benign    GERD (gastroesophageal reflux disease)    Heart murmur    History of kidney stones    History of nephrolithiasis    Hyperlipidemia    Migraine    Myocardial infarction ([place])    2016   Neuromuscular disorder ([place])    Osteoarthritis    Pneumonia    Postsurgical percutaneous transluminal coronary angioplasty status    s/p stent x4   Sarcoidosis    Sleep apnea    uses CPAP   Unspecified sleep apnea    uses C-pap    Social History   Tobacco Use   Smoking status: Never   Smokeless tobacco: Former    Types: Chew    Quit date: 02/06/2016  Substance Use Topics   Alcohol use: No    Alcohol/week: 0.0 standard drinks of alcohol   Marital Status: Married   ROS  Review of Systems  Cardiovascular:  Positive for dyspnea on exertion and leg swelling. Negative for chest pain.  Musculoskeletal:  Positive for arthritis and back pain.  Gastrointestinal:  Negative for melena.    Objective  There were no vitals taken for this visit. There is no height or weight on file to calculate BMI.     02/16/2022    8:59 AM 02/05/2022   11:52 AM 01/09/2022   11:38 AM  Vitals  with BMI  Height 5' 10" 5' 10"   Weight 195 lbs 10 oz 190 lbs 208 lbs  BMI 28.07 22.63 33.54  Systolic 562 563 893  Diastolic 64 72 60  Pulse 74  60    Physical Exam Vitals reviewed.  Constitutional:      General: He is not in acute distress.    Appearance: He is well-developed.  Neck:     Thyroid: No thyromegaly.     Vascular: No carotid bruit or JVD.  Cardiovascular:     Rate and Rhythm: Normal rate and regular rhythm.     Pulses: Normal pulses and intact distal pulses.     Heart sounds: Normal heart sounds, S1 normal and S2 normal. No murmur heard.    No gallop.  Pulmonary:     Effort: Pulmonary effort is normal. No respiratory distress.     Breath sounds: Normal breath sounds. No wheezing, rhonchi or rales.  Musculoskeletal:     Right lower leg: Edema (1+ to ankle) present.     Left lower leg: Edema (2+ to ankle) present.    Laboratory examination:      Latest Ref Rng & Units 01/09/2022   12:05 PM 12/23/2021  11:47 AM 06/09/2021    9:23 AM  CMP  Glucose 70 - 99 mg/dL 156  144  179   BUN 6 - 23 mg/dL _0 Creatinine 0.40 - 1.50 mg/dL 1.56  1.28  1.73   Sodium 135 - 145 mEq/L 140  142  142   Potassium 3.5 - 5.1 mEq/L 4.9  4.3  4.1   Chloride 96 - 112 mEq/L 105  109  108   CO2 19 - 32 mEq/L _1 Calcium 8.4 - 10.5 mg/dL 9.6  9.4  9.6   Total Protein 6.0 - 8.3 g/dL   6.8   Total Bilirubin 0.2 - 1.2 mg/dL   0.5   Alkaline Phos 39 - 117 U/L   90   AST 0 - 37 U/L   18   ALT 0 - 53 U/L   23       Latest Ref Rng & Units 12/23/2021   11:47 AM 06/09/2021    9:23 AM 01/27/2021   12:40 PM  CBC  WBC 4.0 - 10.5 K/uL 5.9  7.2  7.9   Hemoglobin 13.0 - 17.0 g/dL 13.4  13.7  14.0   Hematocrit 39.0 - 52.0 % 39.6  40.9  40.6   Platelets 150.0 - 400.0 K/uL 193.0  198.0  223.0    Lipid Panel     Component Value Date/Time   CHOL 112 06/09/2021 0923   TRIG 73.0 06/09/2021 0923   HDL 40.20 06/09/2021 0923   CHOLHDL 3 06/09/2021 0923   VLDL 14.6 06/09/2021 0923    LDLCALC 57 06/09/2021 0923      HEMOGLOBIN A1C Lab Results  Component Value Date   HGBA1C 7.2 (H) 12/23/2021   MPG 180.03 05/11/2019   TSH No results for input(s): "TSH" in the last 8760 hours.   Allergies   Allergies  Allergen Reactions   Ambien [Zolpidem] Other (See Comments)    Parasomnias, sleep walking   Lisinopril Cough   Gabapentin Other (See Comments)    REACTION: lack of effect for pain   Tramadol Diarrhea     Final Medications at End of Visit     Current Outpatient Medications:    amLODipine (NORVASC) 5 MG tablet, Take 1 tablet (5 mg total) by mouth every morning., Disp: 90 tablet, Rfl: 3   aspirin 81 MG chewable tablet, Chew 81 mg by mouth at bedtime., Disp: , Rfl:    atorvastatin (LIPITOR) 40 MG tablet, TAKE ONE TABLET BY MOUTH EVERY MORNING, Disp: 90 tablet, Rfl: 2   Blood Glucose Monitoring Suppl (ONETOUCH VERIO) w/Device KIT, Use to check blood sugars TID. Dx: E11.9, Disp: 1 kit, Rfl: 0   carvedilol (COREG) 6.25 MG tablet, TAKE ONE TABLET BY MOUTH TWICE DAILY, Disp: 180 tablet, Rfl: 2   Cholecalciferol (VITAMIN D3) 50 MCG (2000 UT) capsule, Take 1 capsule (2,000 Units total) by mouth daily., Disp: , Rfl:    Continuous Blood Gluc Receiver (FREESTYLE LIBRE 2 READER) DEVI, Use with sensors to monitor sugar continuously, Disp: 1 each, Rfl: 0   Continuous Blood Gluc Sensor (FREESTYLE LIBRE 2 SENSOR) MISC, Apply sensor every 14 days to monitor sugar continously, Disp: 2 each, Rfl: 5   cyanocobalamin (,VITAMIN B-12,) 1000 MCG/ML injection, Inject 1 mL (1,000 mcg total) into the muscle every 14 (fourteen) days., Disp: 1 mL, Rfl:    cyclobenzaprine (FLEXERIL) 5 MG tablet, Take 1 tablet (5 mg total) by mouth 3 (three) times daily  as needed for muscle spasms (sedation caution)., Disp: 30 tablet, Rfl: 1   diclofenac Sodium (VOLTAREN) 1 % GEL, Apply 2-4 g topically 4 (four) times daily., Disp: 100 g, Rfl: 5   famotidine (PEPCID) 20 MG tablet, TAKE ONE TABLET BY MOUTH  EVERYDAY AT BEDTIME, Disp: 90 tablet, Rfl: 0   glimepiride (AMARYL) 4 MG tablet, TAKE ONE TABLET BY MOUTH TWICE DAILY, Disp: 180 tablet, Rfl: 0   glucose blood (ONETOUCH VERIO) test strip, USE TO check blood glucose THREE TIMES DAILY AS DIRECTED, Disp: 300 strip, Rfl: 12   guaiFENesin-codeine (ROBITUSSIN AC) 100-10 MG/5ML syrup, Take 5-10 mLs by mouth at bedtime as needed for cough., Disp: 180 mL, Rfl: 0   Insulin Glargine-Lixisenatide (SOLIQUA) 100-33 UNT-MCG/ML SOPN, Inject 30 Units into the skin daily., Disp: 9 mL, Rfl: 0   Insulin Pen Needle 32G X 4 MM MISC, Use daily with insulin pen., Disp: 100 each, Rfl: 3   Insulin Syringe-Needle U-100 (INSULIN SYRINGE 1CC/31GX5/16") 31G X 5/16" 1 ML MISC, Use with NPH insulin twice daily., Disp: 100 each, Rfl: 3   isosorbide mononitrate (IMDUR) 30 MG 24 hr tablet, TAKE ONE TABLET BY MOUTH EVERYDAY AT BEDTIME, Disp: 90 tablet, Rfl: 2   Lancets (ONETOUCH ULTRASOFT) lancets, Use to check blood sugars TID. Dx: E11.9, Disp: 300 each, Rfl: 12   losartan (COZAAR) 100 MG tablet, TAKE ONE TABLET BY MOUTH EVERYDAY AT BEDTIME, Disp: 90 tablet, Rfl: 1   metFORMIN (GLUCOPHAGE) 500 MG tablet, TAKE ONE TABLET BY MOUTH TWICE DAILY, Disp: 180 tablet, Rfl: 2   nitroGLYCERIN (NITROSTAT) 0.4 MG SL tablet, Place 1 tablet (0.4 mg total) under the tongue every 5 (five) minutes as needed for chest pain., Disp: 25 tablet, Rfl: 3   ondansetron (ZOFRAN) 4 MG tablet, Take 4 mg by mouth every 8 (eight) hours as needed., Disp: , Rfl:    oxyCODONE-acetaminophen (PERCOCET/ROXICET) 5-325 MG tablet, Take 1 tablet by mouth 4 (four) times daily., Disp: , Rfl:    pantoprazole (PROTONIX) 40 MG tablet, TAKE ONE TABLET BY MOUTH EVERY MORNING, Disp: 90 tablet, Rfl: 0   tamsulosin (FLOMAX) 0.4 MG CAPS capsule, TAKE ONE CAPSULE BY MOUTH EVERY MORNING and TAKE ONE CAPSULE BY MOUTH EVERYDAY AT BEDTIME, Disp: 60 capsule, Rfl: 2   Cardiac Studies:   Echocardiogram 02/01/2018: Left ventricle cavity is  normal in size. Normal global wall motion. Indeterminate diastolic filling pattern, indeterminate LAP. Calculated EF 55%. No significant valvular abnormality. Inadequate tricuspid regurgitation jet to estimate pulmonary artery pressure. Normal right atrial pressure.  Coronary angiogram 10/16/2016: 3.0 x 38 mm onyx DES Prox RCA. 06/14/2014: Thrombectomy of the mid RCA & Stenting with 3.0 x 18 mm Xience Alpine DES. Stenting mid LAD 3.0 x 18 mm Xience Alpine DES. Patent Mid LAD 2.75x12 mm Taxus, 2.5x12 Distal RCA stent x 2 from 2004 and 2007. Normal LVEF. Diffuse CAD.  PCV MYOCARDIAL PERFUSION WITH LEXISCAN 10/30/2020 Lexiscan nuclear stress test performed using 1-day protocol. SPECT imaging shows diaphragmatic tissue attenuation, but otherwise normal myocardial perfusion. Stress LVEF 63%. Low risk study.  EKG  01/05/2022: Normal sinus rhythm at a rate of 61 bpm.  Normal axis.  No evidence of ischemia or underlying injury pattern.  Compared to EKG 05/05/2021, no PR WP  05/05/2021: Normal sinus rhythm at rate of 60 bpm, poor R wave progression, cannot exclude anteroseptal infarct old.  No evidence of ischemia.  No significant change from 07/23/2020.   Assessment   No diagnosis found.  There are no discontinued medications.  No orders of the defined types were placed in this encounter.   Recommendations:   HARLES EVETTS  is a 61 y.o. male  with coronary artery disease status post PCI to RCA and LAD, long-standing degenerative disc disease and involving neck and shoulders, sciatica, hyperlipidemia, OSA on CPAP, uncontrolled DM, hypertension.   ***   Adrian Prows, PA-C 05/05/2022, 6:58 PM Office: [phone]

## 2022-05-05 NOTE — Progress Notes (Signed)
  Chronic Care Management Pharmacy Assistant   Name: Tony Manning  MRN: [id number] DOB: 06/20/1961  Reason for Encounter: CCM (Appointment Reminder)  Medications: Outpatient Encounter Medications as of 05/05/2022  Medication Sig   amLODipine (NORVASC) 5 MG tablet Take 1 tablet (5 mg total) by mouth every morning.   aspirin 81 MG chewable tablet Chew 81 mg by mouth at bedtime.   atorvastatin (LIPITOR) 40 MG tablet TAKE ONE TABLET BY MOUTH EVERY MORNING   Blood Glucose Monitoring Suppl (ONETOUCH VERIO) w/Device KIT Use to check blood sugars TID. Dx: E11.9   carvedilol (COREG) 6.25 MG tablet TAKE ONE TABLET BY MOUTH TWICE DAILY   Cholecalciferol (VITAMIN D3) 50 MCG (2000 UT) capsule Take 1 capsule (2,000 Units total) by mouth daily.   Continuous Blood Gluc Receiver (FREESTYLE LIBRE 2 READER) DEVI Use with sensors to monitor sugar continuously   Continuous Blood Gluc Sensor (FREESTYLE LIBRE 2 SENSOR) MISC Apply sensor every 14 days to monitor sugar continously   cyanocobalamin (,VITAMIN B-12,) 1000 MCG/ML injection Inject 1 mL (1,000 mcg total) into the muscle every 14 (fourteen) days.   cyclobenzaprine (FLEXERIL) 5 MG tablet Take 1 tablet (5 mg total) by mouth 3 (three) times daily as needed for muscle spasms (sedation caution).   diclofenac Sodium (VOLTAREN) 1 % GEL Apply 2-4 g topically 4 (four) times daily.   famotidine (PEPCID) 20 MG tablet TAKE ONE TABLET BY MOUTH EVERYDAY AT BEDTIME   glimepiride (AMARYL) 4 MG tablet TAKE ONE TABLET BY MOUTH TWICE DAILY   glucose blood (ONETOUCH VERIO) test strip USE TO check blood glucose THREE TIMES DAILY AS DIRECTED   guaiFENesin-codeine (ROBITUSSIN AC) 100-10 MG/5ML syrup Take 5-10 mLs by mouth at bedtime as needed for cough.   Insulin Glargine-Lixisenatide (SOLIQUA) 100-33 UNT-MCG/ML SOPN Inject 30 Units into the skin daily.   Insulin Pen Needle 32G X 4 MM MISC Use daily with insulin pen.   Insulin Syringe-Needle U-100 (INSULIN SYRINGE  1CC/31GX5/16") 31G X 5/16" 1 ML MISC Use with NPH insulin twice daily.   isosorbide mononitrate (IMDUR) 30 MG 24 hr tablet TAKE ONE TABLET BY MOUTH EVERYDAY AT BEDTIME   Lancets (ONETOUCH ULTRASOFT) lancets Use to check blood sugars TID. Dx: E11.9   losartan (COZAAR) 100 MG tablet TAKE ONE TABLET BY MOUTH EVERYDAY AT BEDTIME   metFORMIN (GLUCOPHAGE) 500 MG tablet TAKE ONE TABLET BY MOUTH TWICE DAILY   nitroGLYCERIN (NITROSTAT) 0.4 MG SL tablet Place 1 tablet (0.4 mg total) under the tongue every 5 (five) minutes as needed for chest pain.   ondansetron (ZOFRAN) 4 MG tablet Take 4 mg by mouth every 8 (eight) hours as needed.   oxyCODONE-acetaminophen (PERCOCET/ROXICET) 5-325 MG tablet Take 1 tablet by mouth 4 (four) times daily.   pantoprazole (PROTONIX) 40 MG tablet TAKE ONE TABLET BY MOUTH EVERY MORNING   tamsulosin (FLOMAX) 0.4 MG CAPS capsule TAKE ONE CAPSULE BY MOUTH EVERY MORNING and TAKE ONE CAPSULE BY MOUTH EVERYDAY AT BEDTIME   No facility-administered encounter medications on file as of 05/05/2022.   Caley M Trachtenberg was contacted to remind of upcoming telephone visit with Lindsey Foltanski on 05/08/2022 at 10:15. Patient was reminded to have any blood glucose and blood pressure readings available for review at appointment.   Message was left reminding patient of appointment.  CCM referral has been placed prior to visit?  No   Star Rating Drugs: Medication:  Last Fill: Day Supply Atorvastatin 40 mg 05/01/22 30 Glimepiride 4 mg 05/01/22 30 Metformin 500   mg 05/01/22 30 Losartan 100 mg 05/01/22 30  Lindsey Foltanski, CPP notified  Amy Moss, RMA Clinical Pharmacy Assistant [phone]      

## 2022-05-06 ENCOUNTER — Ambulatory Visit: Payer: Medicare HMO | Admitting: Cardiology

## 2022-05-06 ENCOUNTER — Encounter: Payer: Self-pay | Admitting: Cardiology

## 2022-05-06 VITALS — BP 136/61 | HR 60 | Temp 98.3°F | Resp 16 | Ht 70.0 in | Wt 207.0 lb

## 2022-05-06 DIAGNOSIS — I25118 Atherosclerotic heart disease of native coronary artery with other forms of angina pectoris: Secondary | ICD-10-CM

## 2022-05-06 DIAGNOSIS — I10 Essential (primary) hypertension: Secondary | ICD-10-CM | POA: Diagnosis not present

## 2022-05-06 DIAGNOSIS — E782 Mixed hyperlipidemia: Secondary | ICD-10-CM

## 2022-05-08 ENCOUNTER — Ambulatory Visit: Payer: Medicare HMO | Admitting: Pharmacist

## 2022-05-08 DIAGNOSIS — E7800 Pure hypercholesterolemia, unspecified: Secondary | ICD-10-CM

## 2022-05-08 DIAGNOSIS — I2510 Atherosclerotic heart disease of native coronary artery without angina pectoris: Secondary | ICD-10-CM

## 2022-05-08 DIAGNOSIS — N189 Chronic kidney disease, unspecified: Secondary | ICD-10-CM

## 2022-05-08 DIAGNOSIS — E1149 Type 2 diabetes mellitus with other diabetic neurological complication: Secondary | ICD-10-CM

## 2022-05-08 DIAGNOSIS — I10 Essential (primary) hypertension: Secondary | ICD-10-CM

## 2022-05-08 NOTE — Progress Notes (Signed)
Chronic Care Management Pharmacy Note  Name:  Tony Manning   MRN :  [id number] DOB:  [birth year]  Summary: CCM F/U visit -DM: A1c 7.2% (12/2021); pt stopped Ozempic end of July due to donut hole cost; he was switched from [place] to [place] to keep GLP-1 RA on board, started 2 weeks ago. Current CGM report shows slightly worse control with higher AM glucose Reviewed AGP report: 04/25/22 to 05/08/22. Sensor active: 70%  Time in range (70-180): 45% (goal > 70%)  High (>180): 54%  Low (< 70): 1% (goal < 4%)  GMI: 7.8%; Average glucose: 189  Recommendations:  -Advised to Increase Soliqua to 35 units and move to bedtime  Plan:  -Pharmacist follow up televisit scheduled for 2 weeks -PCP visit 04/02/22 - pt cancelled and needs to reschedule    Subjective: Tony Manning is an 61 y.o. year old male who is a primary patient of [name], Elveria Rising, MD.  The CCM team was consulted for assistance with disease management and care coordination needs.    Engaged with patient by telephone for follow up visit in response to provider referral for pharmacy case management and/or care coordination services.   Consent to Services:  The patient was given information about Chronic Care Management services, agreed to services, and gave verbal consent prior to initiation of services.  Please see initial visit note for detailed documentation.   Patient Care Team: Tonia Ghent, MD as PCP - General (Family Medicine) Adrian Prows, MD as Consulting Physician (Cardiology) Charlton Haws, [facility] as Pharmacist (Pharmacist)  Recent office visits: 02/05/22 Dr Diona Browner VV: acute cough - rx'd zpak, codeine cough syrup. 01/09/22 Dr [name] OV: f/u - SOB. Take lasix for weight > 208#. Can try Lyrica 1 AM, 2 PM.  11/19/21 NP Eugenia Pancoast VV: covid-19. Rx'd Augmentin. 11/14/20 NP Eugenia Pancoast VV: covid-19. Rx'd Lagevrio. 09/05/2021 - [name], MD - Patient presented for Covid positive. Start: Lagevrio. Stop  due to patient not taking: oxycodone 06/09/2021 - [name], MD - Patient presented for [facility]. Labs: CMP, A1c, CBC, Lipid, PSA and Vit B. Foot exam completed. Ran out of Ozempic, will restart. A1c stable 7.2%, Kidney function declined, refer to renal clinic. Administered: cyanocobalamin ((VITAMIN B-12)) injection 1,000 mcg. No medication changes. Follow up 3 months.    Recent consult visits:  05/06/22 Dr Einar Gip (Cardiology): f/u HF, doing well, no changes.  02/16/22 NP Tammy Parrett (pulmonary): f/u Sarcoid. D/c pregabalin. D/c lasix, Kcl. Ordered PFTs.  01/05/22 PA Lawerance Cruel (cardiology): SOB/edema. Rx'd furosemide 20 mg and Kcl 10 mEq. Reduced amlodipine to 5 mg.  10/02/21 Dr Vikki Ports (psychology): chronic pain  - stable for spinal cord stimulator trial   Hospital visits:  None in previous 6 months  Objective:  Lab Results  Component Value Date   CREATININE 1.56 (H) 01/09/2022   BUN 19 01/09/2022   GFR 47.53 (L) 01/09/2022   GFRNONAA 54 (L) 11/12/2020   GFRAA 42 (L) 05/05/2019   NA 140 01/09/2022   K 4.9 01/09/2022   CALCIUM 9.6 01/09/2022   CO2 29 01/09/2022   GLUCOSE 156 (H) 01/09/2022    Lab Results  Component Value Date/Time   HGBA1C 7.2 (H) 12/23/2021 11:47 AM   HGBA1C 7.2 (H) 06/09/2021 09:23 AM   GFR 47.53 (L) 01/09/2022 12:05 PM   GFR 60.28 12/23/2021 11:47 AM   MICROALBUR 1.5 08/15/2012 09:05 AM    Last diabetic Eye exam:  Lab Results  Component Value Date/Time  HMDIABEYEEXA Retinopathy (A) 01/13/2021 12:00 AM    Last diabetic Foot exam:  10/25/20 normal   Lab Results  Component Value Date   CHOL 112 06/09/2021   HDL 40.20 06/09/2021   LDLCALC 57 06/09/2021   TRIG 73.0 06/09/2021   CHOLHDL 3 06/09/2021       Latest Ref Rng & Units 06/09/2021    9:23 AM 01/27/2021   12:40 PM 03/05/2020    2:46 PM  Hepatic Function  Total Protein 6.0 - 8.3 g/dL 6.8  7.5  7.5   Albumin 3.5 - 5.2 g/dL 4.3  4.8  4.6   AST 0 - 37 U/L $Remo'18  20  20   'KnQws$ ALT  0 - 53 U/L $Remo'23  21  21   'VvdoO$ Alk Phosphatase 39 - 117 U/L 90  78  67   Total Bilirubin 0.2 - 1.2 mg/dL 0.5  0.6  0.5   Bilirubin, Direct 0.0 - 0.3 mg/dL  0.1      Lab Results  Component Value Date/Time   TSH 1.34 01/27/2021 12:40 PM   TSH 2.22 03/21/2018 12:05 PM       Latest Ref Rng & Units 12/23/2021   11:47 AM 06/09/2021    9:23 AM 01/27/2021   12:40 PM  CBC  WBC 4.0 - 10.5 K/uL 5.9  7.2  7.9   Hemoglobin 13.0 - 17.0 g/dL 13.4  13.7  14.0   Hematocrit 39.0 - 52.0 % 39.6  40.9  40.6   Platelets 150.0 - 400.0 K/uL 193.0  198.0  223.0     Lab Results  Component Value Date/Time   VD25OH 23.11 (L) 12/23/2021 11:47 AM   VD25OH 19.37 (L) 03/21/2018 12:05 PM    Clinical ASCVD: Yes  The ASCVD Risk score (Arnett DK, et al., 2019) failed to calculate for the following reasons:   The patient has a prior MI or stroke diagnosis       12/23/2021   11:03 AM 09/20/2020    2:06 PM 04/21/2018    9:57 AM  Depression screen PHQ 2/9  Decreased Interest 0 0 0  Down, Depressed, Hopeless 0 0 0  PHQ - 2 Score 0 0 0  Altered sleeping  0   Tired, decreased energy  0   Change in appetite  0   Feeling bad or failure about yourself   0   Trouble concentrating  0   Moving slowly or fidgety/restless  0   Suicidal thoughts  0   PHQ-9 Score  0   Difficult doing work/chores  Not difficult at all     Social History   Tobacco Use  Smoking Status Never  Smokeless Tobacco Former   Types: Chew   Quit date: 02/06/2016   BP Readings from Last 3 Encounters:  05/06/22 136/61  02/16/22 114/64  02/05/22 124/72   Pulse Readings from Last 3 Encounters:  05/06/22 60  02/16/22 74  01/09/22 60   Wt Readings from Last 3 Encounters:  05/06/22 207 lb (93.9 kg)  02/16/22 195 lb 9.6 oz (88.7 kg)  02/05/22 190 lb (86.2 kg)   BMI Readings from Last 3 Encounters:  05/06/22 29.70 kg/m  02/16/22 28.07 kg/m  02/05/22 27.26 kg/m    Assessment/Interventions: Review of patient past medical history,  allergies, medications, health status, including review of consultants reports, laboratory and other test data, was performed as part of comprehensive evaluation and provision of chronic care management services.   SDOH:  (Social Determinants of Health) assessments and interventions  performed: No - done 11/2021   SDOH Screenings   Alcohol Screen: Low Risk  (09/20/2020)   Alcohol Screen    Last Alcohol Screening Score (AUDIT): 0  Depression (PHQ2-9): Low Risk  (12/23/2021)   Depression (PHQ2-9)    PHQ-2 Score: 0  Financial Resource Strain: Medium Risk (11/21/2021)   Overall Financial Resource Strain (CARDIA)    Difficulty of Paying Living Expenses: Somewhat hard  Food Insecurity: No Food Insecurity (11/21/2021)   Hunger Vital Sign    Worried About Running Out of Food in the Last Year: Never true    [place] in the Last Year: Never true  Housing: Low Risk  (09/20/2020)   Housing    Last Housing Risk Score: 0  Physical Activity: Inactive (09/20/2020)   Exercise Vital Sign    Days of Exercise per Week: 0 days    Minutes of Exercise per Session: 0 min  Social Connections: Not on file  Stress: No Stress Concern Present (09/20/2020)   [place]    Feeling of Stress : Not at all  Tobacco Use: Medium Risk (05/06/2022)   Patient History    Smoking Tobacco Use: Never    Smokeless Tobacco Use: Former    Passive Exposure: Not on file  Transportation Needs: No Transportation Needs (09/20/2020)   PRAPARE - Hydrologist (Medical): No    Lack of Transportation (Non-Medical): No    CCM Care Plan  Allergies  Allergen Reactions   Ambien [Zolpidem] Other (See Comments)    Parasomnias, sleep walking   Lisinopril Cough   Gabapentin Other (See Comments)    REACTION: lack of effect for pain   Tramadol Diarrhea    Medications Reviewed Today     Reviewed by Adrian Prows, MD (Physician) on 05/06/22  at 1017  Med List Status: <None>   Medication Order Taking? Sig Documenting Provider Last Dose Status Informant  amLODipine (NORVASC) 5 MG tablet [id number] Yes Take 1 tablet (5 mg total) by mouth every morning. Cantwell, Celeste C, PA-C Taking Active   aspirin 81 MG chewable tablet [id number] Yes Chew 81 mg by mouth at bedtime. [provider] Taking Active Self  atorvastatin (LIPITOR) 40 MG tablet [id number] Yes TAKE ONE TABLET BY MOUTH EVERY MORNING Tonia Ghent, MD Taking Active   Blood Glucose Monitoring Suppl [facility] VERIO) w/Device KIT [id number] Yes Use to check blood sugars TID. Dx: E11.9 Tonia Ghent, MD Taking Active   carvedilol (COREG) 6.25 MG tablet [id number] Yes TAKE ONE TABLET BY MOUTH TWICE DAILY Adrian Prows, MD Taking Active   Cholecalciferol (VITAMIN D3) 50 MCG (2000 UT) capsule [id number] Yes Take 1 capsule (2,000 Units total) by mouth daily. Tonia Ghent, MD Taking Active   Continuous Blood Gluc Receiver (FREESTYLE LIBRE 2 READER) [place] [id number] Yes Use with sensors to monitor sugar continuously Tonia Ghent, MD Taking Active   Continuous Blood Gluc Sensor (FREESTYLE LIBRE 2 SENSOR) [place] [id number] Yes Apply sensor every 14 days to monitor sugar continously Tonia Ghent, MD Taking Active   cyclobenzaprine (FLEXERIL) 5 MG tablet [id number] Yes Take 1 tablet (5 mg total) by mouth 3 (three) times daily as needed for muscle spasms (sedation caution). Tonia Ghent, MD Taking Active   diclofenac Sodium (VOLTAREN) 1 % GEL [id number] Yes Apply 2-4 g topically 4 (four) times daily. Tonia Ghent, MD Taking Active   famotidine (PEPCID) 20 MG tablet [id number] Yes TAKE  ONE TABLET BY MOUTH EVERYDAY AT BEDTIME Parrett, Fonnie Mu, NP Taking Active   glimepiride (AMARYL) 4 MG tablet [id number] Yes TAKE ONE TABLET BY MOUTH TWICE DAILY Tonia Ghent, MD Taking Active   Insulin Glargine-Lixisenatide [facility]) 100-33 UNT-MCG/ML SOPN [id number] Yes Inject 30 Units into the  skin daily. Tonia Ghent, MD Taking Active   isosorbide mononitrate (IMDUR) 30 MG 24 hr tablet [id number] Yes TAKE ONE TABLET BY MOUTH EVERYDAY AT BEDTIME Adrian Prows, MD Taking Active   losartan (COZAAR) 100 MG tablet [id number] Yes TAKE ONE TABLET BY MOUTH EVERYDAY AT BEDTIME Adrian Prows, MD Taking Active   metFORMIN (GLUCOPHAGE) 500 MG tablet [id number] Yes TAKE ONE TABLET BY MOUTH TWICE DAILY Tonia Ghent, MD Taking Active   nitroGLYCERIN (NITROSTAT) 0.4 MG SL tablet [id number] Yes Place 1 tablet (0.4 mg total) under the tongue every 5 (five) minutes as needed for chest pain. Miquel Dunn, NP Taking Active   ondansetron [facility]) 4 MG tablet [id number] Yes Take 4 mg by mouth every 8 (eight) hours as needed. [provider] Taking Active   oxyCODONE-acetaminophen (PERCOCET/ROXICET) 5-325 MG tablet [id number] Yes Take 1 tablet by mouth 4 (four) times daily. [provider] Taking Active   pantoprazole (PROTONIX) 40 MG tablet [id number] Yes TAKE ONE TABLET BY MOUTH EVERY MORNING Tonia Ghent, MD Taking Active   tamsulosin (FLOMAX) 0.4 MG CAPS capsule [id number] Yes TAKE ONE CAPSULE BY MOUTH EVERY MORNING and TAKE ONE CAPSULE BY MOUTH EVERYDAY AT BEDTIME Tonia Ghent, MD Taking Active             Patient Active Problem List   Diagnosis Date Noted   Acute cough 02/05/2022   Near syncope 12/25/2021   COVID-19 11/14/2021   Advance care planning 06/13/2021   Insomnia 06/13/2021   B12 deficiency 01/30/2021   Fatigue 01/29/2021   Abdominal discomfort 01/05/2021   SOB (shortness of breath) 11/12/2020   Sarcoidosis 11/12/2020   H/O colonoscopy 10/27/2020   Nocturia 05/03/2020   Right sided weakness 03/06/2020   Low back pain 01/29/2020   Facial weakness 11/16/2019   Hematuria 03/22/2018   Renal stones 03/22/2018   Vitamin D deficiency, unspecified 10/04/2017   Chronic neck pain (Primary Area of Pain) (B) (L>R) 09/21/2017   Chronic upper extremity pain  (Secondary Area of Pain) (Bilateral) (L>R) 09/21/2017   Chronic left shoulder pain (Tertiary Area of Pain) 09/21/2017   Chronic low back pain (Fourth Area of Pain) (B) (R>L) 09/21/2017   Chronic pain of lower extremity (B) (R>L) 09/21/2017   Other long term (current) drug therapy 09/21/2017   Other specified health status 09/21/2017   Other chronic pain 08/22/2017   Other fatigue 05/10/2017   Radiculopathy 04/07/2017   Angina pectoris ([place]) 10/15/2016   Cervical radiculitis 07/23/2016   Dysphagia 06/12/2016   Sciatica 02/06/2016   Plantar fasciitis 01/16/2016   Right sided sciatica 05/16/2015   Hand pain 04/05/2015   Premature ejaculation 09/17/2014   Heel pain 09/17/2014   NSTEMI (non-ST elevated myocardial infarction) ([place]) 06/13/2014   Atypical chest pain 06/06/2014   Memory change 05/24/2014   Benign paroxysmal positional vertigo 07/13/2013   Orthostasis 07/13/2013   Migraine, unspecified, without mention of intractable migraine without mention of status migrainosus 04/03/2013   Pain in joint, ankle and foot 10/19/2012   Irritable mood 01/18/2012   Neck pain 12/03/2010   Hearing loss 12/03/2010   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   HYPERCHOLESTEROLEMIA 10/20/2010   DEPRESSION 10/20/2010  Osteoarthritis 10/20/2010   ARTHRITIS 10/20/2010   NEPHROLITHIASIS, HX OF 10/20/2010   ESSENTIAL HYPERTENSION, BENIGN 09/30/2010   CORONARY ATHEROSCLEROSIS NATIVE CORONARY ARTERY 09/30/2010   Sleep apnea 09/30/2010   CHEST PAIN UNSPECIFIED 09/30/2010   PERCUTANEOUS TRANSLUMINAL CORONARY ANGIOPLASTY, HX OF 09/30/2010    Immunization History  Administered Date(s) Administered   Influenza Inj Mdck Quad Pf 06/02/2018   Influenza Whole 09/17/2010   Influenza,inj,Quad PF,6+ Mos 06/08/2013, 05/22/2014, 05/12/2016, 05/08/2019, 05/27/2020   Influenza-Unspecified 06/08/2013, 05/22/2014, 06/08/2015, 05/12/2016, 05/21/2017, 06/02/2018, 05/09/2019, 05/21/2021    PFIZER(Purple Top)SARS-COV-2 Vaccination 11/27/2019, 12/25/2019, 08/07/2020, 05/21/2021   Pneumococcal Polysaccharide-23 09/08/2007, 02/16/2013   Td 09/07/2009    Conditions to be addressed/monitored:  Hypertension, Hyperlipidemia, Diabetes, Coronary Artery Disease, and Chronic Kidney Disease  Care Plan : [place]  Updates made by Charlton Haws, [place] since 05/08/2022 12:00 AM     Problem: Hypertension, Hyperlipidemia, Diabetes, Coronary Artery Disease, and Chronic Kidney Disease   Priority: High     Long-Range Goal: Disease mgmt   Start Date: 11/27/2020  Expected End Date: 11/22/2022  This Visit's Progress: On track  Recent Progress: On track  Priority: High  Note:   Current Barriers:  Uncontrolled diabetes  Pharmacist Clinical Goal(s):  Patient will adhere to plan to optimize therapeutic regimen for diabetes as evidenced by report of adherence to recommended medication management changes through collaboration with PharmD and provider.   Interventions: 1:1 collaboration with Tonia Ghent, MD regarding development and update of comprehensive plan of care as evidenced by provider attestation and co-signature Inter-disciplinary care team collaboration (see longitudinal plan of care) Comprehensive medication review performed; medication list updated in electronic medical record  Hypertension / CKD Stage 3b (BP goal <130/80) -Controlled - BP at goal in office  -Current treatment: Amodipine 5 mg daily - Appropriate, Effective, Safe, Accessible Carvedilol 6.25 mg BID -Appropriate, Effective, Safe, Accessible Isosorbide [place] 30 mg HS -Appropriate, Effective, Safe, Accessible Losartan 100 mg daily -Appropriate, Effective, Safe, Accessible -Medications previously tried: n/a  -Educated on BP goals and benefits of medications for prevention of heart attack, stroke and kidney damage; -Counseled to monitor BP at home periodically, document, and provide log at future  appointments -Recommended to continue current medication  Hyperlipidemia / CAD (LDL goal < 70) -Controlled - LDL 59 -Hx CAD. NSTEMI 06/2014 -Current treatment: Atorvastatin 40 mg daily -Appropriate, Effective, Safe, Accessible Aspirin 81 mg daily -Appropriate, Effective, Safe, Accessible Isosorbide MN 30 mg daily -Appropriate, Effective, Safe, Accessible Nitroglycerin 0.4 mg SL prn -Appropriate, Effective, Safe, Accessible -Medications previously tried: none  -Recommended to continue current medication  Diabetes (A1c goal <7%) -Not ideally controlled - A1c 7.2% (12/2021); pt took last dose of Ozempic 7/30, he cannot afford to refill Ozempic in [place]ut [place] so was switched to [place] ($35 copay) 8/18. -He reports he eats 1 meal/day typically late in the day (5-7 pm) and drinks a Cheerwine in the morning "to get going"; reviewed AGP report (below) - BG is elevated 9am-12pm likely due to AM Cheerwine, advised pt to switch to diet or other sugar-free alternative, pt is not sure he will do this -Home BG readings: Freestyle Libre 2 [facility]) Reviewed AGP report: 04/25/22 to 05/08/22. Sensor active: 70%  Time in range (70-180): 45% (goal > 70%)  High (>180): 54%  Low (< 70): 1% (goal < 4%)  GMI: 7.8%; Average glucose: 189  -Previous AGP report: 04/11/22 to 04/24/22. Sensor active: 71%  Time in range (70-180): 55% (goal > 70%)  High (>180): 45%  Low (< 70): 0% (goal < 4%)  GMI: 7.4%; Average glucose: 173  -Current medications: Glimepiride 4 mg BID - Appropriate, Query Effective, Metformin 500 mg BID -Appropriate, Query Effective, Soliqua 30 units daily AM - Appropriate, Query Effective, -Medications previously tried: Novolin N 20 u BID, Ozempic (cost in donut hole), Tresiba -Reviewed AGP report (change to [place]) - morning glucose is higher, he had some low sugars in mid-morning while at work, he doe carry glucose tablets but only takes 1 at a time -Educated on hypoglycemia rule of 15s; advised to  take 4 glucose tablets at once for 16 g -Recommend to increase [place] to 35 units and move to bedtime   Chronic Kidney Disease Stage 3b  -All medications assessed for renal dosing and appropriateness in chronic kidney disease. -Recommended to continue current medication  Chronic pain (Goal: manage symptoms) -Controlled -cervical radiculitis, sciatica, osteoarthritis. Injections from other did not help, going to pain mgmt. -Current treatment  Oxycodone-APAP 5-325 mg Cyclobenzaprine 5 mg TID prn -Medications previously tried: n/a  -Recommended to continue current medication  Health Maintenance -Vaccine gaps: Shingrix -Advised to start B12 1000 mcg daily and Vitamin D 1000 IU daily  Patient Goals/Self-Care Activities Patient will:  - take medications as prescribed as evidenced by patient report and record review focus on medication adherence by routine check glucose daily, document, and provide at future appointments     Compliance/Adherence/Medication fill history: Care Gaps: Colonoscopy (due 03/23/2018)  Star-Rating Drugs: Atorvastatin - PDC 100% Glimepiride - PDC 100% Metformin - PDC 100% Losartan - PDC 100%  Medication Assistance:  [place] denied 2023 due to income too high.  Medication Access: Within the past 30 days, how often has patient missed a dose of medication? 0 Is a pillbox or other method used to improve adherence? Yes  Factors that may affect medication adherence? no barriers identified Are meds synced by current pharmacy? Yes  Are meds delivered by current pharmacy? Yes  Does patient experience delays in picking up medications due to transportation concerns? No   [facility] Reviewed: Is patient disadvantaged to use [facility]?: No  Current Rx insurance plan: [employer] [place] Name and location of Current pharmacy:  [facility] - [place], [place] - [address] [place]  [place] [address] Phone: [phone] Fax: [phone]  [facility] services reviewed with patient today?: Yes  Pill Packs, 30 DS (last delivery 04/07/22) Amlodipine 10mg - 1 tablet daily (1 breakfast) Aspirin 81mg - 1 tablet daily (breakfast) Metformin 500mg  1 tablet twice daily (1 tablet  breakfast, 1 tablet evening meal) Carvedilol 6.25mg - 1 tablet twice daily (1 breakfast, 1 evening meal) Isosorbide 30mg - 1 tablet daily (1 bedtime) Tamsulosin 0.4mg  2 capsule daily (1 breakfast, 1 bedtime)   Losartan 100 mg- 1 tablet daily- (1 bedtime) Atorvastatin 40mg - 1 tablet daily (1 breakfast) Glimepiride 4mg - 1 tablet twice daily (1 breakfast, 1 evening meal) Pantoprazole 40 mg- 1 tablet daily (1 breakfast)   Declined medications:  Tresiba Flex Inj 100 units - switching to [facility] and Follow Up Patient Decision:  Patient agrees to Care Plan and Follow-up.  Follow Up Plan: Telephone follow up appointment with care management team member scheduled for: 2 weeks  Charlene Brooke, PharmD, [facility] Clinical Pharmacist [place] [facility] [phone]

## 2022-05-08 NOTE — Patient Instructions (Signed)
Visit Information  Phone number for Pharmacist: [phone]   Goals Addressed   None     Care Plan : [place]  Updates made by Charlton Haws, RPH since 05/08/2022 12:00 AM     Problem: Hypertension, Hyperlipidemia, Diabetes, Coronary Artery Disease, and Chronic Kidney Disease   Priority: High     Long-Range Goal: Disease mgmt   Start Date: 11/27/2020  Expected End Date: 11/22/2022  This Visit's Progress: On track  Recent Progress: On track  Priority: High  Note:   Current Barriers:  Uncontrolled diabetes  Pharmacist Clinical Goal(s):  Patient will adhere to plan to optimize therapeutic regimen for diabetes as evidenced by report of adherence to recommended medication management changes through collaboration with PharmD and provider.   Interventions: 1:1 collaboration with Tonia Ghent, MD regarding development and update of comprehensive plan of care as evidenced by provider attestation and co-signature Inter-disciplinary care team collaboration (see longitudinal plan of care) Comprehensive medication review performed; medication list updated in electronic medical record  Hypertension / CKD Stage 3b (BP goal <130/80) -Controlled - BP at goal in office  -Current treatment: Amodipine 5 mg daily - Appropriate, Effective, Safe, Accessible Carvedilol 6.25 mg BID -Appropriate, Effective, Safe, Accessible Isosorbide MN 30 mg HS -Appropriate, Effective, Safe, Accessible Losartan 100 mg daily -Appropriate, Effective, Safe, Accessible -Medications previously tried: n/a  -Educated on BP goals and benefits of medications for prevention of heart attack, stroke and kidney damage; -Counseled to monitor BP at home periodically, document, and provide log at future appointments -Recommended to continue current medication  Hyperlipidemia / CAD (LDL goal < 70) -Controlled - LDL 59 -Hx CAD. NSTEMI 06/2014 -Current treatment: Atorvastatin 40 mg daily -Appropriate,  Effective, Safe, Accessible Aspirin 81 mg daily -Appropriate, Effective, Safe, Accessible Isosorbide MN 30 mg daily -Appropriate, Effective, Safe, Accessible Nitroglycerin 0.4 mg SL prn -Appropriate, Effective, Safe, Accessible -Medications previously tried: none  -Recommended to continue current medication  Diabetes (A1c goal <7%) -Not ideally controlled - A1c 7.2% (12/2021); pt took last dose of Ozempic 7/30, he cannot afford to refill Ozempic in donut hole so was switched to [place] ($35 copay) 8/18. -He reports he eats 1 meal/day typically late in the day (5-7 pm) and drinks a Cheerwine in the morning "to get going"; reviewed AGP report (below) - BG is elevated 9am-12pm likely due to AM Cheerwine, advised pt to switch to diet or other sugar-free alternative, pt is not sure he will do this -Home BG readings: Freestyle Libre 2 [facility]) Reviewed AGP report: 04/25/22 to 05/08/22. Sensor active: 70%  Time in range (70-180): 45% (goal > 70%)  High (>180): 54%  Low (< 70): 1% (goal < 4%)  GMI: 7.8%; Average glucose: 189  -Previous AGP report: 04/11/22 to 04/24/22. Sensor active: 71%  Time in range (70-180): 55% (goal > 70%)  High (>180): 45%  Low (< 70): 0% (goal < 4%)  GMI: 7.4%; Average glucose: 173  -Current medications: Glimepiride 4 mg BID - Appropriate, Query Effective, Metformin 500 mg BID -Appropriate, Query Effective, Soliqua 30 units daily AM - Appropriate, Query Effective, -Medications previously tried: Novolin N 20 u BID, Ozempic (cost in donut hole), Tresiba -Reviewed AGP report (change to [place]) - morning glucose is higher, he had some low sugars in mid-morning while at work, he doe carry glucose tablets but only takes 1 at a time -Educated on hypoglycemia rule of 15s; advised to take 4 glucose tablets at once for 16 g -Recommend to increase [place]  to 35 units and move to bedtime   Chronic Kidney Disease Stage 3b  -All medications assessed for renal dosing and appropriateness  in chronic kidney disease. -Recommended to continue current medication  Chronic pain (Goal: manage symptoms) -Controlled -cervical radiculitis, sciatica, osteoarthritis. Injections from other did not help, going to pain mgmt. -Current treatment  Oxycodone-APAP 5-325 mg Cyclobenzaprine 5 mg TID prn -Medications previously tried: n/a  -Recommended to continue current medication  Health Maintenance -Vaccine gaps: Shingrix -Advised to start B12 1000 mcg daily and Vitamin D 1000 IU daily  Patient Goals/Self-Care Activities Patient will:  - take medications as prescribed as evidenced by patient report and record review focus on medication adherence by routine check glucose daily, document, and provide at future appointments      Patient verbalizes understanding of instructions and care plan provided today and agrees to view in [place]. Active MyChart status and patient understanding of how to access instructions and care plan via MyChart confirmed with patient.    Telephone follow up appointment with pharmacy team member scheduled for: 2 weeks  Charlene Brooke, PharmD, [facility] Clinical Pharmacist [place] [facility] at [facility] [phone]

## 2022-05-12 DIAGNOSIS — M47812 Spondylosis without myelopathy or radiculopathy, cervical region: Secondary | ICD-10-CM | POA: Diagnosis not present

## 2022-05-15 DIAGNOSIS — E113293 Type 2 diabetes mellitus with mild nonproliferative diabetic retinopathy without macular edema, bilateral: Secondary | ICD-10-CM | POA: Diagnosis not present

## 2022-05-15 DIAGNOSIS — H524 Presbyopia: Secondary | ICD-10-CM | POA: Diagnosis not present

## 2022-05-15 DIAGNOSIS — H35013 Changes in retinal vascular appearance, bilateral: Secondary | ICD-10-CM | POA: Diagnosis not present

## 2022-05-15 DIAGNOSIS — H25041 Posterior subcapsular polar age-related cataract, right eye: Secondary | ICD-10-CM | POA: Diagnosis not present

## 2022-05-15 DIAGNOSIS — H5203 Hypermetropia, bilateral: Secondary | ICD-10-CM | POA: Diagnosis not present

## 2022-05-15 DIAGNOSIS — H52223 Regular astigmatism, bilateral: Secondary | ICD-10-CM | POA: Diagnosis not present

## 2022-05-15 LAB — HM DIABETES EYE EXAM

## 2022-05-18 ENCOUNTER — Telehealth: Payer: Self-pay | Admitting: Family Medicine

## 2022-05-18 NOTE — Telephone Encounter (Signed)
[facility] sent over a fax for the patient and others. Placed in Dr. [name] box. Thank you!

## 2022-05-19 ENCOUNTER — Telehealth: Payer: Self-pay

## 2022-05-19 NOTE — Progress Notes (Signed)
Chronic Care Management Pharmacy Assistant   Name: Tony Manning  MRN: [id number] DOB: 06-13-1961  Reason for Encounter: CCM (Appointment Reminder)  Medications: Outpatient Encounter Medications as of 05/19/2022  Medication Sig   amLODipine (NORVASC) 5 MG tablet Take 1 tablet (5 mg total) by mouth every morning.   aspirin 81 MG chewable tablet Chew 81 mg by mouth at bedtime.   atorvastatin (LIPITOR) 40 MG tablet TAKE ONE TABLET BY MOUTH EVERY MORNING   Blood Glucose Monitoring Suppl (ONETOUCH VERIO) w/Device KIT Use to check blood sugars TID. Dx: E11.9   carvedilol (COREG) 6.25 MG tablet TAKE ONE TABLET BY MOUTH TWICE DAILY   Cholecalciferol (VITAMIN D3) 50 MCG (2000 UT) capsule Take 1 capsule (2,000 Units total) by mouth daily.   Continuous Blood Gluc Receiver (FREESTYLE LIBRE 2 READER) DEVI Use with sensors to monitor sugar continuously   Continuous Blood Gluc Sensor (FREESTYLE LIBRE 2 SENSOR) MISC Apply sensor every 14 days to monitor sugar continously   cyclobenzaprine (FLEXERIL) 5 MG tablet Take 1 tablet (5 mg total) by mouth 3 (three) times daily as needed for muscle spasms (sedation caution).   diclofenac Sodium (VOLTAREN) 1 % GEL Apply 2-4 g topically 4 (four) times daily.   famotidine (PEPCID) 20 MG tablet TAKE ONE TABLET BY MOUTH EVERYDAY AT BEDTIME   glimepiride (AMARYL) 4 MG tablet TAKE ONE TABLET BY MOUTH TWICE DAILY   Insulin Glargine-Lixisenatide (SOLIQUA) 100-33 UNT-MCG/ML SOPN Inject 30 Units into the skin daily.   isosorbide mononitrate (IMDUR) 30 MG 24 hr tablet TAKE ONE TABLET BY MOUTH EVERYDAY AT BEDTIME   losartan (COZAAR) 100 MG tablet TAKE ONE TABLET BY MOUTH EVERYDAY AT BEDTIME   metFORMIN (GLUCOPHAGE) 500 MG tablet TAKE ONE TABLET BY MOUTH TWICE DAILY   nitroGLYCERIN (NITROSTAT) 0.4 MG SL tablet Place 1 tablet (0.4 mg total) under the tongue every 5 (five) minutes as needed for chest pain.   ondansetron (ZOFRAN) 4 MG tablet Take 4 mg by mouth every 8  (eight) hours as needed.   oxyCODONE-acetaminophen (PERCOCET/ROXICET) 5-325 MG tablet Take 1 tablet by mouth 4 (four) times daily.   pantoprazole (PROTONIX) 40 MG tablet TAKE ONE TABLET BY MOUTH EVERY MORNING   tamsulosin (FLOMAX) 0.4 MG CAPS capsule TAKE ONE CAPSULE BY MOUTH EVERY MORNING and TAKE ONE CAPSULE BY MOUTH EVERYDAY AT BEDTIME   No facility-administered encounter medications on file as of 05/19/2022.   [name] was contacted to remind of upcoming telephone visit with Al Corpus on 05/22/2022 at 1:45. Patient was reminded to have any blood glucose and blood pressure readings available for review at appointment.   Message was left reminding patient of appointment.  CCM referral has been placed prior to visit?  No   Star Rating Drugs: Medication:                Last Fill:         Day Supply Atorvastatin 40 mg      05/01/22          30 Glimepiride 4 mg         05/01/22          30 Metformin 500 mg       05/01/22          30 Losartan 100 mg         05/01/22          30   Al Corpus, CPP notified   Claudina Lick, [place] Clinical Pharmacy Assistant  [phone]    

## 2022-05-21 ENCOUNTER — Other Ambulatory Visit: Payer: Self-pay | Admitting: Family Medicine

## 2022-05-21 DIAGNOSIS — E1149 Type 2 diabetes mellitus with other diabetic neurological complication: Secondary | ICD-10-CM

## 2022-05-22 ENCOUNTER — Telehealth: Payer: Self-pay | Admitting: Pharmacist

## 2022-05-22 ENCOUNTER — Ambulatory Visit: Payer: Medicare HMO | Admitting: Pharmacist

## 2022-05-22 DIAGNOSIS — Z79891 Long term (current) use of opiate analgesic: Secondary | ICD-10-CM | POA: Diagnosis not present

## 2022-05-22 DIAGNOSIS — M542 Cervicalgia: Secondary | ICD-10-CM | POA: Diagnosis not present

## 2022-05-22 DIAGNOSIS — Z794 Long term (current) use of insulin: Secondary | ICD-10-CM

## 2022-05-22 DIAGNOSIS — I10 Essential (primary) hypertension: Secondary | ICD-10-CM

## 2022-05-22 DIAGNOSIS — M47812 Spondylosis without myelopathy or radiculopathy, cervical region: Secondary | ICD-10-CM | POA: Diagnosis not present

## 2022-05-22 DIAGNOSIS — E7800 Pure hypercholesterolemia, unspecified: Secondary | ICD-10-CM

## 2022-05-22 NOTE — Telephone Encounter (Signed)
Patient returned call. See CCM documentation 

## 2022-05-22 NOTE — Telephone Encounter (Signed)
  Chronic Care Management   Outreach Note  05/22/2022 Name: Tony Manning MRN: [id number] DOB: 1961-07-15  Referred by: Tonia Ghent, MD  Patient had a phone appointment scheduled with clinical pharmacist today.  An unsuccessful telephone outreach was attempted today. The patient was referred to the pharmacist for assistance with medications, care management and care coordination.   Patient will NOT be penalized in any way for missing a CCM appointment. The no-show fee does not apply.  If possible, a message was left to return call to: [phone] or to [facility].  Charlene Brooke, PharmD, BCACP Clinical Pharmacist [place] [facility] at [facility] [phone]

## 2022-05-22 NOTE — Patient Instructions (Addendum)
Visit Information  Phone number for Pharmacist: [phone]   Goals Addressed   None     Care Plan : [place]  Updates made by Charlton Haws, RPH since 05/22/2022 12:00 AM     Problem: Hypertension, Hyperlipidemia, Diabetes, Coronary Artery Disease, and Chronic Kidney Disease   Priority: High     Long-Range Goal: Disease mgmt   Start Date: 11/27/2020  Expected End Date: 11/22/2022  This Visit's Progress: On track  Recent Progress: On track  Priority: High  Note:   Current Barriers:  Uncontrolled diabetes  Pharmacist Clinical Goal(s):  Patient will adhere to plan to optimize therapeutic regimen for diabetes as evidenced by report of adherence to recommended medication management changes through collaboration with PharmD and provider.   Interventions: 1:1 collaboration with Tonia Ghent, MD regarding development and update of comprehensive plan of care as evidenced by provider attestation and co-signature Inter-disciplinary care team collaboration (see longitudinal plan of care) Comprehensive medication review performed; medication list updated in electronic medical record  Hypertension / CKD Stage 3b (BP goal <130/80) -Controlled - BP at goal in office  -Current treatment: Amodipine 5 mg daily - Appropriate, Effective, Safe, Accessible Carvedilol 6.25 mg BID -Appropriate, Effective, Safe, Accessible Isosorbide MN 30 mg HS -Appropriate, Effective, Safe, Accessible Losartan 100 mg daily -Appropriate, Effective, Safe, Accessible -Medications previously tried: n/a  -Educated on BP goals and benefits of medications for prevention of heart attack, stroke and kidney damage; -Counseled to monitor BP at home periodically, document, and provide log at future appointments -Recommended to continue current medication  Hyperlipidemia / CAD (LDL goal < 70) -Controlled - LDL 59 -Hx CAD. NSTEMI 06/2014 -Current treatment: Atorvastatin 40 mg daily -Appropriate,  Effective, Safe, Accessible Aspirin 81 mg daily -Appropriate, Effective, Safe, Accessible Isosorbide MN 30 mg daily -Appropriate, Effective, Safe, Accessible Nitroglycerin 0.4 mg SL prn -Appropriate, Effective, Safe, Accessible -Medications previously tried: none  -Recommended to continue current medication  Diabetes (A1c goal <7%) -Not ideally controlled - A1c 7.2% (12/2021); pt took last dose of Ozempic 7/30, he cannot afford to refill Ozempic in donut hole so was switched to [place] ($35 copay) 8/18. -Pt has cortisone injection in neck 9/5, sugar was elevated for several days afterward -He reports he eats 1 meal/day typically late in the day (5-7 pm) and drinks a Cheerwine in the morning "to get going"; reviewed AGP report (below) - BG is elevated 9am-12pm likely due to AM Cheerwine, advised pt to switch to diet or other sugar-free alternative, pt is not sure he will do this -Home BG readings: Freestyle Libre 2 [facility]) Reviewed AGP report: 05/09/22 to 05/22/22. Sensor active: 64%  Time in range (70-180): 35% (goal > 70%)  High (>180): 65%  Low (< 70): 0% (goal < 4%)  GMI: 8.5%; Average glucose: 218  Previous AGP report: 04/25/22 to 05/08/22. Sensor active: 70%  Time in range (70-180): 45% (goal > 70%)  High (>180): 54%  Low (< 70): 1% (goal < 4%)  GMI: 7.8%; Average glucose: 189  -Current medications: Glimepiride 4 mg BID - Appropriate, Query Effective, Metformin 500 mg BID -Appropriate, Query Effective, Soliqua 35 units daily AM - Appropriate, Query Effective, -Medications previously tried: Novolin N 20 u BID, Ozempic (cost in donut hole), Tresiba -Reviewed AGP report, sugar control is worse the past 2 weeks likely due to cortisone injection on 9/5 and persistent neck pain -Educated on hypoglycemia rule of 15s; advised to take 4 glucose tablets at once for 16 g -Recommend  to increase Soliqua to 38 units  Chronic Kidney Disease Stage 3b  -All medications assessed for renal dosing and  appropriateness in chronic kidney disease. -Recommended to continue current medication  Chronic pain (Goal: manage symptoms) -Controlled -cervical radiculitis, sciatica, osteoarthritis. Injections from other did not help, going to pain mgmt. -Current treatment  Oxycodone-APAP 5-325 mg Cyclobenzaprine 5 mg TID prn Gabapentin -Medications previously tried: n/a  -Recommended to continue current medication  Health Maintenance -Vaccine gaps: Shingrix -Advised to start B12 1000 mcg daily and Vitamin D 1000 IU daily  Patient Goals/Self-Care Activities Patient will:  - take medications as prescribed as evidenced by patient report and record review focus on medication adherence by routine check glucose daily, document, and provide at future appointments      Patient verbalizes understanding of instructions and care plan provided today and agrees to view in [place]. Active MyChart status and patient understanding of how to access instructions and care plan via MyChart confirmed with patient.    Telephone follow up appointment with pharmacy team member scheduled for: 4 weeks  Charlene Brooke, PharmD, BCACP Clinical Pharmacist [place] [facility] at [facility] [phone]

## 2022-05-22 NOTE — Progress Notes (Signed)
Chronic Care Management Pharmacy Note 05/22/22 Name:  Tony Manning   MRN :  [id number] DOB:  [birth year]  Summary: CCM F/U visit -DM: A1c 7.2% (12/2021); pt is now on Soliqua 35 units daily, per CGM control has been worse the past 2 weeks, likely due to cortisone injection in neck on 9/5 that left sugars elevated for several days Reviewed AGP report: 05/09/22 to 05/22/22. Sensor active: 64%  Time in range (70-180): 35% (goal > 70%)  High (>180): 65%  Low (< 70): 0% (goal < 4%)  GMI: 8.5%; Average glucose: 218  Recommendations:  -Advised to Increase Soliqua to 38 units daily  Plan:  -Pharmacist follow up televisit scheduled for 4 weeks -PCP appt 06/12/22    Subjective: Tony Manning is an 61 y.o. year old male who is a primary patient of [name], Elveria Rising, MD.  The CCM team was consulted for assistance with disease management and care coordination needs.    Engaged with patient by telephone for follow up visit in response to provider referral for pharmacy case management and/or care coordination services.   Consent to Services:  The patient was given information about Chronic Care Management services, agreed to services, and gave verbal consent prior to initiation of services.  Please see initial visit note for detailed documentation.   Patient Care Team: Tonia Ghent, MD as PCP - General (Family Medicine) Adrian Prows, MD as Consulting Physician (Cardiology) Charlton Haws, [facility] as Pharmacist (Pharmacist)  Recent office visits: 02/05/22 Dr Diona Browner VV: acute cough - rx'd zpak, codeine cough syrup. 01/09/22 Dr [name] OV: f/u - SOB. Take lasix for weight > 208#. Can try Lyrica 1 AM, 2 PM.  11/19/21 NP Eugenia Pancoast VV: covid-19. Rx'd Augmentin. 11/14/20 NP Eugenia Pancoast VV: covid-19. Rx'd Lagevrio. 09/05/2021 - [name], MD - Patient presented for Covid positive. Start: Lagevrio. Stop due to patient not taking: oxycodone 06/09/2021 - [name], MD - Patient  presented for [facility]. Labs: CMP, A1c, CBC, Lipid, PSA and Vit B. Foot exam completed. Ran out of Ozempic, will restart. A1c stable 7.2%, Kidney function declined, refer to renal clinic. Administered: cyanocobalamin ((VITAMIN B-12)) injection 1,000 mcg. No medication changes. Follow up 3 months.    Recent consult visits:  05/06/22 Dr Einar Gip (Cardiology): f/u HF, doing well, no changes.  02/16/22 NP Tammy Parrett (pulmonary): f/u Sarcoid. D/c pregabalin. D/c lasix, Kcl. Ordered PFTs.  01/05/22 PA Lawerance Cruel (cardiology): SOB/edema. Rx'd furosemide 20 mg and Kcl 10 mEq. Reduced amlodipine to 5 mg.  10/02/21 Dr Vikki Ports (psychology): chronic pain  - stable for spinal cord stimulator trial   Hospital visits:  None in previous 6 months  Objective:  Lab Results  Component Value Date   CREATININE 1.56 (H) 01/09/2022   BUN 19 01/09/2022   GFR 47.53 (L) 01/09/2022   GFRNONAA 54 (L) 11/12/2020   GFRAA 42 (L) 05/05/2019   NA 140 01/09/2022   K 4.9 01/09/2022   CALCIUM 9.6 01/09/2022   CO2 29 01/09/2022   GLUCOSE 156 (H) 01/09/2022    Lab Results  Component Value Date/Time   HGBA1C 7.2 (H) 12/23/2021 11:47 AM   HGBA1C 7.2 (H) 06/09/2021 09:23 AM   GFR 47.53 (L) 01/09/2022 12:05 PM   GFR 60.28 12/23/2021 11:47 AM   MICROALBUR 1.5 08/15/2012 09:05 AM    Last diabetic Eye exam:  Lab Results  Component Value Date/Time   HMDIABEYEEXA Retinopathy (A) 01/13/2021 12:00 AM    Last diabetic Foot exam:  10/25/20 normal   Lab Results  Component Value Date   CHOL 112 06/09/2021   HDL 40.20 06/09/2021   LDLCALC 57 06/09/2021   TRIG 73.0 06/09/2021   CHOLHDL 3 06/09/2021       Latest Ref Rng & Units 06/09/2021    9:23 AM 01/27/2021   12:40 PM 03/05/2020    2:46 PM  Hepatic Function  Total Protein 6.0 - 8.3 g/dL 6.8  7.5  7.5   Albumin 3.5 - 5.2 g/dL 4.3  4.8  4.6   AST 0 - 37 U/L $Remo'18  20  20   'GkIjI$ ALT 0 - 53 U/L $Remo'23  21  21   'IcWTx$ Alk Phosphatase 39 - 117 U/L 90  78  67   Total  Bilirubin 0.2 - 1.2 mg/dL 0.5  0.6  0.5   Bilirubin, Direct 0.0 - 0.3 mg/dL  0.1      Lab Results  Component Value Date/Time   TSH 1.34 01/27/2021 12:40 PM   TSH 2.22 03/21/2018 12:05 PM       Latest Ref Rng & Units 12/23/2021   11:47 AM 06/09/2021    9:23 AM 01/27/2021   12:40 PM  CBC  WBC 4.0 - 10.5 K/uL 5.9  7.2  7.9   Hemoglobin 13.0 - 17.0 g/dL 13.4  13.7  14.0   Hematocrit 39.0 - 52.0 % 39.6  40.9  40.6   Platelets 150.0 - 400.0 K/uL 193.0  198.0  223.0     Lab Results  Component Value Date/Time   VD25OH 23.11 (L) 12/23/2021 11:47 AM   VD25OH 19.37 (L) 03/21/2018 12:05 PM    Clinical ASCVD: Yes  The ASCVD Risk score (Arnett DK, et al., 2019) failed to calculate for the following reasons:   The patient has a prior MI or stroke diagnosis       12/23/2021   11:03 AM 09/20/2020    2:06 PM 04/21/2018    9:57 AM  Depression screen PHQ 2/9  Decreased Interest 0 0 0  Down, Depressed, Hopeless 0 0 0  PHQ - 2 Score 0 0 0  Altered sleeping  0   Tired, decreased energy  0   Change in appetite  0   Feeling bad or failure about yourself   0   Trouble concentrating  0   Moving slowly or fidgety/restless  0   Suicidal thoughts  0   PHQ-9 Score  0   Difficult doing work/chores  Not difficult at all     Social History   Tobacco Use  Smoking Status Never  Smokeless Tobacco Former   Types: Chew   Quit date: 02/06/2016   BP Readings from Last 3 Encounters:  05/06/22 136/61  02/16/22 114/64  02/05/22 124/72   Pulse Readings from Last 3 Encounters:  05/06/22 60  02/16/22 74  01/09/22 60   Wt Readings from Last 3 Encounters:  05/06/22 207 lb (93.9 kg)  02/16/22 195 lb 9.6 oz (88.7 kg)  02/05/22 190 lb (86.2 kg)   BMI Readings from Last 3 Encounters:  05/06/22 29.70 kg/m  02/16/22 28.07 kg/m  02/05/22 27.26 kg/m    Assessment/Interventions: Review of patient past medical history, allergies, medications, health status, including review of consultants reports,  laboratory and other test data, was performed as part of comprehensive evaluation and provision of chronic care management services.   SDOH:  (Social Determinants of Health) assessments and interventions performed: No - done 11/2021 SDOH Interventions    Flowsheet Row Chronic Care  Management from 11/21/2021 in [place] at [place] Management from 12/20/2020 in [place] at [place] Management from 11/27/2020 in [place] at [place] from 09/20/2020 in [place] at [place] Interventions      Food Insecurity Interventions Intervention Not Indicated -- -- --  Depression Interventions/Treatment  -- -- -- NFA2-1 Score <4 Follow-up Not Indicated  Financial Strain Interventions Other (Comment)  [Renew Novo Cares PAP] Other (Comment)  [Ozempic patient assistance approved] --  [Application for med assistance in process] --       SDOH Screenings   Food Insecurity: No Food Insecurity (11/21/2021)  Housing: Low Risk  (09/20/2020)  Transportation Needs: No Transportation Needs (09/20/2020)  Alcohol Screen: Low Risk  (09/20/2020)  Depression (PHQ2-9): Low Risk  (12/23/2021)  Financial Resource Strain: Medium Risk (11/21/2021)  Physical Activity: Inactive (09/20/2020)  Stress: No Stress Concern Present (09/20/2020)  Tobacco Use: Medium Risk (05/06/2022)    CCM Care Plan  Allergies  Allergen Reactions   Ambien [Zolpidem] Other (See Comments)    Parasomnias, sleep walking   Lisinopril Cough   Gabapentin Other (See Comments)    REACTION: lack of effect for pain   Tramadol Diarrhea    Medications Reviewed Today     Reviewed by Adrian Prows, MD (Physician) on 05/06/22 at 1017  Med List Status: <None>   Medication Order Taking? Sig Documenting Provider Last Dose Status Informant  amLODipine (NORVASC) 5 MG tablet [id number] Yes Take 1 tablet (5 mg total) by mouth every morning. Cantwell, Celeste C, PA-C Taking  Active   aspirin 81 MG chewable tablet [id number] Yes Chew 81 mg by mouth at bedtime. [provider] Taking Active Self  atorvastatin (LIPITOR) 40 MG tablet [id number] Yes TAKE ONE TABLET BY MOUTH EVERY MORNING Tonia Ghent, MD Taking Active   Blood Glucose Monitoring Suppl [facility] VERIO) w/Device KIT [id number] Yes Use to check blood sugars TID. Dx: E11.9 Tonia Ghent, MD Taking Active   carvedilol (COREG) 6.25 MG tablet [id number] Yes TAKE ONE TABLET BY MOUTH TWICE DAILY Adrian Prows, MD Taking Active   Cholecalciferol (VITAMIN D3) 50 MCG (2000 UT) capsule [id number] Yes Take 1 capsule (2,000 Units total) by mouth daily. Tonia Ghent, MD Taking Active   Continuous Blood Gluc Receiver (FREESTYLE LIBRE 2 READER) [place] [id number] Yes Use with sensors to monitor sugar continuously Tonia Ghent, MD Taking Active   Continuous Blood Gluc Sensor (FREESTYLE LIBRE 2 SENSOR) [place] [id number] Yes Apply sensor every 14 days to monitor sugar continously Tonia Ghent, MD Taking Active   cyclobenzaprine (FLEXERIL) 5 MG tablet [id number] Yes Take 1 tablet (5 mg total) by mouth 3 (three) times daily as needed for muscle spasms (sedation caution). Tonia Ghent, MD Taking Active   diclofenac Sodium (VOLTAREN) 1 % GEL [id number] Yes Apply 2-4 g topically 4 (four) times daily. Tonia Ghent, MD Taking Active   famotidine (PEPCID) 20 MG tablet [id number] Yes TAKE ONE TABLET BY MOUTH EVERYDAY AT BEDTIME Parrett, Fonnie Mu, NP Taking Active   glimepiride (AMARYL) 4 MG tablet [id number] Yes TAKE ONE TABLET BY MOUTH TWICE DAILY Tonia Ghent, MD Taking Active   Insulin Glargine-Lixisenatide [facility]) 100-33 UNT-MCG/ML SOPN [id number] Yes Inject 30 Units into the skin daily. Tonia Ghent, MD Taking Active   isosorbide mononitrate (IMDUR) 30 MG 24 hr tablet [id number] Yes TAKE ONE TABLET BY MOUTH EVERYDAY AT BEDTIME Adrian Prows, MD Taking Active   losartan (  COZAAR) 100 MG tablet [id number] Yes TAKE ONE  TABLET BY MOUTH EVERYDAY AT BEDTIME Adrian Prows, MD Taking Active   metFORMIN (GLUCOPHAGE) 500 MG tablet [id number] Yes TAKE ONE TABLET BY MOUTH TWICE DAILY Tonia Ghent, MD Taking Active   nitroGLYCERIN (NITROSTAT) 0.4 MG SL tablet [id number] Yes Place 1 tablet (0.4 mg total) under the tongue every 5 (five) minutes as needed for chest pain. Miquel Dunn, NP Taking Active   ondansetron [facility]) 4 MG tablet [id number] Yes Take 4 mg by mouth every 8 (eight) hours as needed. [provider] Taking Active   oxyCODONE-acetaminophen (PERCOCET/ROXICET) 5-325 MG tablet [id number] Yes Take 1 tablet by mouth 4 (four) times daily. [provider] Taking Active   pantoprazole (PROTONIX) 40 MG tablet [id number] Yes TAKE ONE TABLET BY MOUTH EVERY MORNING Tonia Ghent, MD Taking Active   tamsulosin (FLOMAX) 0.4 MG CAPS capsule [id number] Yes TAKE ONE CAPSULE BY MOUTH EVERY MORNING and TAKE ONE CAPSULE BY MOUTH EVERYDAY AT BEDTIME Tonia Ghent, MD Taking Active             Patient Active Problem List   Diagnosis Date Noted   Acute cough 02/05/2022   Near syncope 12/25/2021   COVID-19 11/14/2021   Advance care planning 06/13/2021   Insomnia 06/13/2021   B12 deficiency 01/30/2021   Fatigue 01/29/2021   Abdominal discomfort 01/05/2021   SOB (shortness of breath) 11/12/2020   Sarcoidosis 11/12/2020   H/O colonoscopy 10/27/2020   Nocturia 05/03/2020   Right sided weakness 03/06/2020   Low back pain 01/29/2020   Facial weakness 11/16/2019   Hematuria 03/22/2018   Renal stones 03/22/2018   Vitamin D deficiency, unspecified 10/04/2017   Chronic neck pain (Primary Area of Pain) (B) (L>R) 09/21/2017   Chronic upper extremity pain (Secondary Area of Pain) (Bilateral) (L>R) 09/21/2017   Chronic left shoulder pain (Tertiary Area of Pain) 09/21/2017   Chronic low back pain (Fourth Area of Pain) (B) (R>L) 09/21/2017   Chronic pain of lower extremity (B) (R>L) 09/21/2017    Other long term (current) drug therapy 09/21/2017   Other specified health status 09/21/2017   Other chronic pain 08/22/2017   Other fatigue 05/10/2017   Radiculopathy 04/07/2017   Angina pectoris ([employer]) 10/15/2016   Cervical radiculitis 07/23/2016   Dysphagia 06/12/2016   Sciatica 02/06/2016   Plantar fasciitis 01/16/2016   Right sided sciatica 05/16/2015   Hand pain 04/05/2015   Premature ejaculation 09/17/2014   Heel pain 09/17/2014   NSTEMI (non-ST elevated myocardial infarction) ([place]) 06/13/2014   Atypical chest pain 06/06/2014   Memory change 05/24/2014   Benign paroxysmal positional vertigo 07/13/2013   Orthostasis 07/13/2013   Migraine, unspecified, without mention of intractable migraine without mention of status migrainosus 04/03/2013   Pain in joint, ankle and foot 10/19/2012   Irritable mood 01/18/2012   Neck pain 12/03/2010   Hearing loss 12/03/2010   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   HYPERCHOLESTEROLEMIA 10/20/2010   DEPRESSION 10/20/2010   Osteoarthritis 10/20/2010   ARTHRITIS 10/20/2010   NEPHROLITHIASIS, HX OF 10/20/2010   ESSENTIAL HYPERTENSION, BENIGN 09/30/2010   CORONARY ATHEROSCLEROSIS NATIVE CORONARY ARTERY 09/30/2010   Sleep apnea 09/30/2010   CHEST PAIN UNSPECIFIED 09/30/2010   PERCUTANEOUS TRANSLUMINAL CORONARY ANGIOPLASTY, HX OF 09/30/2010    Immunization History  Administered Date(s) Administered   Influenza Inj Mdck Quad Pf 06/02/2018   Influenza Whole 09/17/2010   Influenza,inj,Quad PF,6+ Mos 06/08/2013, 05/22/2014, 05/12/2016, 05/08/2019, 05/27/2020   Influenza-Unspecified 06/08/2013, 05/22/2014, 06/08/2015, 05/12/2016,  05/21/2017, 06/02/2018, 05/09/2019, 05/21/2021   PFIZER(Purple Top)SARS-COV-2 Vaccination 11/27/2019, 12/25/2019, 08/07/2020, 05/21/2021   Pneumococcal Polysaccharide-23 09/08/2007, 02/16/2013   Td 09/07/2009    Conditions to be addressed/monitored:  Hypertension, Hyperlipidemia,  Diabetes, Coronary Artery Disease, and Chronic Kidney Disease  Care Plan : [place]  Updates made by Charlton Haws, [place] since 05/22/2022 12:00 AM     Problem: Hypertension, Hyperlipidemia, Diabetes, Coronary Artery Disease, and Chronic Kidney Disease   Priority: High     Long-Range Goal: Disease mgmt   Start Date: 11/27/2020  Expected End Date: 11/22/2022  This Visit's Progress: On track  Recent Progress: On track  Priority: High  Note:   Current Barriers:  Uncontrolled diabetes  Pharmacist Clinical Goal(s):  Patient will adhere to plan to optimize therapeutic regimen for diabetes as evidenced by report of adherence to recommended medication management changes through collaboration with PharmD and provider.   Interventions: 1:1 collaboration with Tonia Ghent, MD regarding development and update of comprehensive plan of care as evidenced by provider attestation and co-signature Inter-disciplinary care team collaboration (see longitudinal plan of care) Comprehensive medication review performed; medication list updated in electronic medical record  Hypertension / CKD Stage 3b (BP goal <130/80) -Controlled - BP at goal in office  -Current treatment: Amodipine 5 mg daily - Appropriate, Effective, Safe, Accessible Carvedilol 6.25 mg BID -Appropriate, Effective, Safe, Accessible Isosorbide MN 30 mg HS -Appropriate, Effective, Safe, Accessible Losartan 100 mg daily -Appropriate, Effective, Safe, Accessible -Medications previously tried: n/a  -Educated on BP goals and benefits of medications for prevention of heart attack, stroke and kidney damage; -Counseled to monitor BP at home periodically, document, and provide log at future appointments -Recommended to continue current medication  Hyperlipidemia / CAD (LDL goal < 70) -Controlled - LDL 59 -Hx CAD. NSTEMI 06/2014 -Current treatment: Atorvastatin 40 mg daily -Appropriate, Effective, Safe,  Accessible Aspirin 81 mg daily -Appropriate, Effective, Safe, Accessible Isosorbide MN 30 mg daily -Appropriate, Effective, Safe, Accessible Nitroglycerin 0.4 mg SL prn -Appropriate, Effective, Safe, Accessible -Medications previously tried: none  -Recommended to continue current medication  Diabetes (A1c goal <7%) -Not ideally controlled - A1c 7.2% (12/2021); pt took last dose of Ozempic 7/30, he cannot afford to refill Ozempic in [place] so was switched to [place] ($35 copay) 8/18. -Pt has cortisone injection in neck 9/5, sugar was elevated for several days afterward -He reports he eats 1 meal/day typically late in the day (5-7 pm) and drinks a Cheerwine in the morning "to get going"; reviewed AGP report (below) - BG is elevated 9am-12pm likely due to AM Cheerwine, advised pt to switch to diet or other sugar-free alternative, pt is not sure he will do this -Home BG readings: Freestyle Libre 2 [facility]) Reviewed AGP report: 05/09/22 to 05/22/22. Sensor active: 64%  Time in range (70-180): 35% (goal > 70%)  High (>180): 65%  Low (< 70): 0% (goal < 4%)  GMI: 8.5%; Average glucose: 218  Previous AGP report: 04/25/22 to 05/08/22. Sensor active: 70%  Time in range (70-180): 45% (goal > 70%)  High (>180): 54%  Low (< 70): 1% (goal < 4%)  GMI: 7.8%; Average glucose: 189  -Current medications: Glimepiride 4 mg BID - Appropriate, Query Effective, Metformin 500 mg BID -Appropriate, Query Effective, Soliqua 35 units daily AM - Appropriate, Query Effective, -Medications previously tried: Novolin N 20 u BID, Ozempic (cost in [place]), Tresiba -Reviewed AGP report, sugar control is worse the past 2 weeks likely due to cortisone injection  on 9/5 and persistent neck pain -Educated on hypoglycemia rule of 15s; advised to take 4 glucose tablets at once for 16 g -Recommend to increase Soliqua to 38 units  Chronic Kidney Disease Stage 3b  -All medications assessed for renal dosing and appropriateness in  chronic kidney disease. -Recommended to continue current medication  Chronic pain (Goal: manage symptoms) -Controlled -cervical radiculitis, sciatica, osteoarthritis. Injections from other did not help, going to pain mgmt. -Current treatment  Oxycodone-APAP 5-325 mg Cyclobenzaprine 5 mg TID prn Gabapentin -Medications previously tried: n/a  -Recommended to continue current medication  Health Maintenance -Vaccine gaps: Shingrix -Advised to start B12 1000 mcg daily and Vitamin D 1000 IU daily  Patient Goals/Self-Care Activities Patient will:  - take medications as prescribed as evidenced by patient report and record review focus on medication adherence by routine check glucose daily, document, and provide at future appointments      Compliance/Adherence/Medication fill history: Care Gaps: Colonoscopy (due 03/23/2018)  Star-Rating Drugs: Atorvastatin - PDC 100% Glimepiride - PDC 100% Metformin - PDC 100% Losartan - PDC 100%  Medication Assistance:  [place] denied 2023 due to income too high.  Medication Access: Within the past 30 days, how often has patient missed a dose of medication? 0 Is a pillbox or other method used to improve adherence? Yes  Factors that may affect medication adherence? no barriers identified Are meds synced by current pharmacy? Yes  Are meds delivered by current pharmacy? Yes  Does patient experience delays in picking up medications due to transportation concerns? No   [facility] Reviewed: Is patient disadvantaged to use [facility]?: No  Current Rx insurance plan: [employer] [place] Name and location of Current pharmacy:  [facility] - [place], [place] - [address] [place] [address] Phone: [phone] Fax: [phone]  [facility] services reviewed with patient today?: Yes  Pill Packs, 30 DS (last delivery 05/08/22) Amlodipine 10mg - 1 tablet daily (1  breakfast) Aspirin 81mg - 1 tablet daily (breakfast) Metformin 500mg  1 tablet twice daily (1 tablet  breakfast, 1 tablet evening meal) Carvedilol 6.25mg - 1 tablet twice daily (1 breakfast, 1 evening meal) Isosorbide 30mg - 1 tablet daily (1 bedtime) Tamsulosin 0.4mg  2 capsule daily (1 breakfast, 1 bedtime)   Losartan 100 mg- 1 tablet daily- (1 bedtime) Atorvastatin 40mg - 1 tablet daily (1 breakfast) Glimepiride 4mg - 1 tablet twice daily (1 breakfast, 1 evening meal) Pantoprazole 40 mg- 1 tablet daily (1 breakfast)    Care Plan and Follow Up Patient Decision:  Patient agrees to Care Plan and Follow-up.  Follow Up Plan: Telephone follow up appointment with care management team member scheduled for: 4 weeks  Charlene Brooke, PharmD, BCACP Clinical Pharmacist [facility] at [facility] [phone]

## 2022-05-25 ENCOUNTER — Telehealth: Payer: Self-pay

## 2022-05-25 NOTE — Progress Notes (Signed)
Chronic Care Management Pharmacy Assistant   Name: Tony Manning  MRN: [id number] DOB: [birth year]  Reason for Encounter: CCM (Medication Adherence and Delivery Coordination)  Recent office visits:  None since last CCM contact  Recent consult visits:  None since last CCM contact  Hospital visits:  None in previous 6 months  Medications: Outpatient Encounter Medications as of 05/25/2022  Medication Sig   amLODipine (NORVASC) 5 MG tablet Take 1 tablet (5 mg total) by mouth every morning.   aspirin 81 MG chewable tablet Chew 81 mg by mouth at bedtime.   atorvastatin (LIPITOR) 40 MG tablet TAKE ONE TABLET BY MOUTH EVERY MORNING   Blood Glucose Monitoring Suppl (ONETOUCH VERIO) w/Device KIT Use to check blood sugars TID. Dx: E11.9   carvedilol (COREG) 6.25 MG tablet TAKE ONE TABLET BY MOUTH TWICE DAILY   Cholecalciferol (VITAMIN D3) 50 MCG (2000 UT) capsule Take 1 capsule (2,000 Units total) by mouth daily.   Continuous Blood Gluc Receiver (FREESTYLE LIBRE 2 READER) DEVI Use with sensors to monitor sugar continuously   Continuous Blood Gluc Sensor (FREESTYLE LIBRE 2 SENSOR) MISC Apply sensor every 14 days to monitor sugar continously   cyclobenzaprine (FLEXERIL) 5 MG tablet Take 1 tablet (5 mg total) by mouth 3 (three) times daily as needed for muscle spasms (sedation caution).   diclofenac Sodium (VOLTAREN) 1 % GEL Apply 2-4 g topically 4 (four) times daily.   famotidine (PEPCID) 20 MG tablet TAKE ONE TABLET BY MOUTH EVERYDAY AT BEDTIME   glimepiride (AMARYL) 4 MG tablet TAKE ONE TABLET BY MOUTH TWICE DAILY   isosorbide mononitrate (IMDUR) 30 MG 24 hr tablet TAKE ONE TABLET BY MOUTH EVERYDAY AT BEDTIME   losartan (COZAAR) 100 MG tablet TAKE ONE TABLET BY MOUTH EVERYDAY AT BEDTIME   metFORMIN (GLUCOPHAGE) 500 MG tablet TAKE ONE TABLET BY MOUTH TWICE DAILY   nitroGLYCERIN (NITROSTAT) 0.4 MG SL tablet Place 1 tablet (0.4 mg total) under the tongue every 5 (five) minutes as needed  for chest pain.   ondansetron (ZOFRAN) 4 MG tablet Take 4 mg by mouth every 8 (eight) hours as needed.   oxyCODONE-acetaminophen (PERCOCET/ROXICET) 5-325 MG tablet Take 1 tablet by mouth 4 (four) times daily.   pantoprazole (PROTONIX) 40 MG tablet TAKE ONE TABLET BY MOUTH EVERY MORNING   SOLIQUA 100-33 UNT-MCG/ML SOPN Inject 30 units into THE SKIN daily (Patient taking differently: Inject 38 Units into the skin daily.)   tamsulosin (FLOMAX) 0.4 MG CAPS capsule TAKE ONE CAPSULE BY MOUTH EVERY MORNING and TAKE ONE CAPSULE BY MOUTH EVERYDAY AT BEDTIME   No facility-administered encounter medications on file as of 05/25/2022.   BP Readings from Last 3 Encounters:  05/06/22 136/61  02/16/22 114/64  02/05/22 124/72    Lab Results  Component Value Date   HGBA1C 7.2 (H) 12/23/2021     No OVs, Consults, or hospital visits since last care coordination call / Pharmacist visit. No medication changes indicated  Last adherence delivery date: 04/07/22      Patient is due for next adherence delivery on: 06/04/22  Spoke with patient on 06/04/22 reviewed medications and coordinated delivery.  This delivery to include: Adherence Packaging  30 Days  Packs: Amlodipine 10mg - 1 tablet daily (1 breakfast) Aspirin 81mg - 1 tablet daily (breakfast) Metformin 500mg  1 tablet twice daily (1 tablet  breakfast, 1 tablet evening meal) Carvedilol 6.25mg - 1 tablet twice daily (1 breakfast, 1 evening meal) Isosorbide 30mg - 1 tablet daily (1 bedtime) Tamsulosin 0.4mg  2 capsule daily (  1 breakfast, 1 bedtime)   Losartan 100 mg- 1 tablet daily- (1 bedtime) Atorvastatin 40mg - 1 tablet daily (1 breakfast) Glimepiride 4mg - 1 tablet twice daily (1 breakfast, 1 evening meal) Pantoprazole 40 mg- 1 tablet daily (1 breakfast) Gabapentin 300 mg - 1 tablet daily (1 bedtime)   Vial medications: No vial medications  Auto-refill monthly. Will be sent to patient separately each month: Soliqua 100-33 Unit - Inject 30 units  daily in the morning   Patient declined the following medications this month: No medications declined  Any concerns about your medications? No  How often do you forget or accidentally miss a dose? Never  Do you use a pillbox? No  Is patient in packaging Yes  If yes  What is the date on your next pill pack? 05/25/22  Any concerns or issues with your packaging? No  No refill request needed.  Confirmed delivery date of 06/04/2022, advised patient that pharmacy will contact them the morning of delivery.   Recent blood pressure readings are as follows: Patient was at work so did not have any readings with him.   Recent blood glucose readings are as follows: Patient was at work so did not have any readings with him.   Annual wellness visit in last year? No Most Recent BP reading: 136/61 on 05/06/22  If Diabetic: Most recent A1C reading: 7.2 on 12/23/2021 Last eye exam / retinopathy screening: 01/13/2021 Last diabetic foot exam: Up to date  Cycle dispensing form sent to Margaretmary Dys, PTM for review.

## 2022-05-31 DIAGNOSIS — Z794 Long term (current) use of insulin: Secondary | ICD-10-CM | POA: Diagnosis not present

## 2022-05-31 DIAGNOSIS — E1149 Type 2 diabetes mellitus with other diabetic neurological complication: Secondary | ICD-10-CM | POA: Diagnosis not present

## 2022-06-08 DIAGNOSIS — M4692 Unspecified inflammatory spondylopathy, cervical region: Secondary | ICD-10-CM | POA: Diagnosis not present

## 2022-06-10 ENCOUNTER — Telehealth: Payer: Self-pay | Admitting: Pharmacist

## 2022-06-10 NOTE — Telephone Encounter (Signed)
Agreed.  Thanks.  

## 2022-06-10 NOTE — Telephone Encounter (Signed)
Patient reports sugars have been uncontrolled. He wonders if he can go back on Ozempic 1 mg and just take 1/2 dose so it lasts longer. Unfortunately Ozempic 1-2 mg have been on backorder and very difficult to find at pharmacies so this is probably not a great plan right now. [employer], [employer] do not allow for dose adjustments with their devices so pt would have to pay full cost each month, which would not be affordable for him. (Of note pt did not qualify for patient assistance earlier this year due to income too high)  -Current medications: Glimepiride 4 mg BID  Metformin 500 mg BID  Soliqua 40 units daily AM (0.42 un/kg)  Reviewed AGP report: 05/28/22 to 06/10/22. Sensor active: 81%  Time in range (70-180): 33% (goal > 70%)  High (>180): 66%  Low (< 70): 0% (goal < 4%)  GMI: 8.4%; Average glucose: 213   Reviewed medication options - alternate GLP-1 RA is not affordable/feasible right now. Can consider SGLT2-inhibitor, however these will also be expensive in donut hole. Affordable options include Actos, Prandin, or bolus insulin.   Also discussed diet changes - currently he eats one large meal/snacks in the evening/nighttime which explains why fasting sugars are elevated, and he will occasionally drop into 70s in mid-afternoon because he does not usually eat much in AM/lunch. Advised pt to try to eat more regularly throughout the day to limit overnight highs and midday lows.  Recommended to increase Soliqua by 2 units every 3 days until fasting BG ~130. Stop titration if he develops any hypoglycemia. Try to eat more regular meals/snacks throughout the day instead of one large meal at night.  PCP appt 10/6.

## 2022-06-12 ENCOUNTER — Encounter: Payer: Self-pay | Admitting: Family Medicine

## 2022-06-12 ENCOUNTER — Ambulatory Visit (INDEPENDENT_AMBULATORY_CARE_PROVIDER_SITE_OTHER): Payer: Medicare HMO | Admitting: Family Medicine

## 2022-06-12 VITALS — BP 120/62 | HR 60 | Temp 98.6°F | Ht 70.0 in | Wt 212.0 lb

## 2022-06-12 DIAGNOSIS — Z23 Encounter for immunization: Secondary | ICD-10-CM

## 2022-06-12 DIAGNOSIS — M542 Cervicalgia: Secondary | ICD-10-CM | POA: Diagnosis not present

## 2022-06-12 DIAGNOSIS — Z794 Long term (current) use of insulin: Secondary | ICD-10-CM | POA: Diagnosis not present

## 2022-06-12 DIAGNOSIS — G8929 Other chronic pain: Secondary | ICD-10-CM

## 2022-06-12 DIAGNOSIS — E1149 Type 2 diabetes mellitus with other diabetic neurological complication: Secondary | ICD-10-CM | POA: Diagnosis not present

## 2022-06-12 LAB — POCT GLYCOSYLATED HEMOGLOBIN (HGB A1C): Hemoglobin A1C: 8 % — AB (ref 4.0–5.6)

## 2022-06-12 MED ORDER — SOLIQUA 100-33 UNT-MCG/ML ~~LOC~~ SOPN: 42.0000 [IU] | PEN_INJECTOR | Freq: Every day | SUBCUTANEOUS | Status: DC

## 2022-06-12 MED ORDER — GABAPENTIN 300 MG PO CAPS: 600.0000 mg | ORAL_CAPSULE | Freq: Every day | ORAL | Status: DC

## 2022-06-12 NOTE — Progress Notes (Signed)
Diabetes:  Using medications without difficulties: yes Hypoglycemic episodes: down to 53 last week, with prolonged fasting.   Hyperglycemic episodes:no Feet problems: leg numbness at baseline, R leg.   Blood Sugars averaging: see below.   eye exam within last year: yes A1c d/w pt at [place]. Sugar 260 this AM at [facility].   He is off ozempic.  Inc appetite off ozempic.    D/w pt about pharmacy advice: Recommended to increase Soliqua by 2 units every 3 days until fasting BG ~130. Stop titration if he develops any hypoglycemia. Try to eat more regular meals/snacks throughout the day instead of one large meal at night.  He is back on gabapentin '600mg'$  qhs, tolerating that.  Plan for neck injection per outside clinic.  He is checking on getting another doc set up after his prev doc reportedly quit.    Meds, vitals, and allergies reviewed.   ROS: Per HPI unless specifically indicated in ROS section   GEN: nad, alert and oriented HEENT: ncat NECK: supple w/o LA CV: rrr. PULM: ctab, no inc wob ABD: soft, +bs EXT: no edema SKIN: no acute rash  Diabetic foot exam: Normal inspection No skin breakdown No calluses  Normal DP pulses Normal sensation to light touch and but dec to monofilament (less sensation on R foot compared to L) Nails normal

## 2022-06-12 NOTE — Patient Instructions (Addendum)
Recommended to increase Soliqua by 2 units every 3 days until fasting BG ~130. Don't increase if more low sugars. Try to eat more regular meals/snacks throughout the day instead of one large meal at night.  You should get a call about the pain clinic.   Plan on recheck in about 3 months.  We can do labs at the visit.    Update me about your sugar in about 2-3 weeks, sooner if needed.

## 2022-06-14 NOTE — Assessment & Plan Note (Signed)
A1c d/w pt at [facility]. Sugar 260 this AM at [facility].   He is off ozempic.  Inc appetite off ozempic.    D/w pt about pharmacy advice: Recommended to increase Soliqua by 2 units every 3 days until fasting BG ~130. Stop titration if he develops any hypoglycemia. Try to eat more regular meals/snacks throughout the day instead of one large meal at night.  See after visit summary.  I asked him to update me about his sugar in a few weeks.

## 2022-06-14 NOTE — Assessment & Plan Note (Signed)
Continue gabapentin and refer to pain clinic.

## 2022-06-19 ENCOUNTER — Ambulatory Visit
Admission: RE | Admit: 2022-06-19 | Discharge: 2022-06-19 | Disposition: A | Discharge status: Home / Self Care | Payer: Medicare HMO | Source: Ambulatory Visit | Attending: Family Medicine | Admitting: Family Medicine

## 2022-06-19 ENCOUNTER — Telehealth: Payer: Self-pay | Admitting: Family Medicine

## 2022-06-19 ENCOUNTER — Other Ambulatory Visit: Payer: Self-pay | Admitting: Family Medicine

## 2022-06-19 ENCOUNTER — Ambulatory Visit (INDEPENDENT_AMBULATORY_CARE_PROVIDER_SITE_OTHER)
Admission: RE | Admit: 2022-06-19 | Discharge: 2022-06-19 | Disposition: A | Discharge status: Home / Self Care | Payer: Medicare HMO | Source: Ambulatory Visit | Attending: Family Medicine | Admitting: Family Medicine

## 2022-06-19 ENCOUNTER — Encounter: Payer: Self-pay | Admitting: Family Medicine

## 2022-06-19 ENCOUNTER — Ambulatory Visit (INDEPENDENT_AMBULATORY_CARE_PROVIDER_SITE_OTHER): Payer: Medicare HMO | Admitting: Family Medicine

## 2022-06-19 VITALS — BP 136/58 | HR 70 | Temp 98.0°F | Ht 70.0 in | Wt 216.0 lb

## 2022-06-19 DIAGNOSIS — R600 Localized edema: Secondary | ICD-10-CM

## 2022-06-19 DIAGNOSIS — M79604 Pain in right leg: Secondary | ICD-10-CM

## 2022-06-19 DIAGNOSIS — Z794 Long term (current) use of insulin: Secondary | ICD-10-CM | POA: Diagnosis not present

## 2022-06-19 DIAGNOSIS — M419 Scoliosis, unspecified: Secondary | ICD-10-CM | POA: Diagnosis not present

## 2022-06-19 DIAGNOSIS — E1149 Type 2 diabetes mellitus with other diabetic neurological complication: Secondary | ICD-10-CM

## 2022-06-19 DIAGNOSIS — M2578 Osteophyte, vertebrae: Secondary | ICD-10-CM | POA: Diagnosis not present

## 2022-06-19 DIAGNOSIS — M47816 Spondylosis without myelopathy or radiculopathy, lumbar region: Secondary | ICD-10-CM | POA: Diagnosis not present

## 2022-06-19 MED ORDER — GABAPENTIN 300 MG PO CAPS: ORAL_CAPSULE | ORAL | Status: DC

## 2022-06-19 NOTE — Telephone Encounter (Signed)
Patient was scheduled at [facility] today 06/19/22 at 4:30  Nothing further needed.

## 2022-06-19 NOTE — Patient Instructions (Addendum)
Get your meter replacement and let me know about your sugars.  Take care.  Glad to see you. Trying using compression stockings in the meantime.   Go to the lab on the way out.   If you have mychart we'll likely use that to update you.    We'll call about the ultrasound.

## 2022-06-19 NOTE — Telephone Encounter (Signed)
When can this patient go for u/s to exclude DVT?  Thanks.

## 2022-06-19 NOTE — Progress Notes (Unsigned)
Diabetes:  Using medications without difficulties: Hypoglycemic episodes: Hyperglycemic episodes: Feet problems: Blood Sugars averaging: eye exam within last year: Weight is back up, while off ozempic.  He may end up getting it out of pocket.  D/w pt about getting his strips and updating me about his sugar.    He ran out of test strips but they should be in route.    R leg pain.  Similar sx last month.  He had R leg swelling and pain, then it resolved.  Similar sx Monday night.  Pain standing.  Pain shooting down the leg.  Two days of edema recently then swelling got better.  Still with pain down the posterior R leg.  No L sided sx.  Altered sensation R leg, compared to L.  Pain with R hip flexion.  R SLR positive.    Meds, vitals, and allergies reviewed.   ROS: Per HPI unless specifically indicated in ROS section   GEN: nad, alert and oriented HEENT: ncat NECK: supple w/o LA CV: rrr. ABD: soft, +bs EXT: trace BLE edema SKIN: no acute rash  R calf 37 cm circ, R calf 38 cm

## 2022-06-21 NOTE — Assessment & Plan Note (Addendum)
More likely to be MSK in origin but given his situation reasonable to exclude DVT with ultrasound.  At this point still okay for outpatient follow-up with routine cautions given to patient.  He can use compression stockings for edema.  See notes on ultrasound.  See notes on plain films.

## 2022-06-21 NOTE — Assessment & Plan Note (Signed)
I asked him to get his strips and then check his sugar and update me about his sugar.  We will go from there.  No change in medication at this point.  Continue Soliqua.

## 2022-06-23 ENCOUNTER — Telehealth: Payer: Self-pay

## 2022-06-23 ENCOUNTER — Telehealth: Payer: Self-pay | Admitting: Family Medicine

## 2022-06-23 ENCOUNTER — Other Ambulatory Visit: Payer: Self-pay | Admitting: Family Medicine

## 2022-06-23 NOTE — Telephone Encounter (Signed)
Spoke with patient he req CB 07/2022

## 2022-06-23 NOTE — Progress Notes (Signed)
    Chronic Care Management Pharmacy Assistant   Name: Tony Manning  MRN: [id number] DOB: 09-16-1960  Reason for Encounter: CCM (Appointment Reminder)  Medications: Outpatient Encounter Medications as of 06/23/2022  Medication Sig   amLODipine (NORVASC) 5 MG tablet Take 1 tablet (5 mg total) by mouth every morning.   aspirin 81 MG chewable tablet Chew 81 mg by mouth at bedtime.   atorvastatin (LIPITOR) 40 MG tablet TAKE ONE TABLET BY MOUTH EVERY MORNING   Blood Glucose Monitoring Suppl (ONETOUCH VERIO) w/Device KIT Use to check blood sugars TID. Dx: E11.9   carvedilol (COREG) 6.25 MG tablet TAKE ONE TABLET BY MOUTH TWICE DAILY   Cholecalciferol (VITAMIN D3) 50 MCG (2000 UT) capsule Take 1 capsule (2,000 Units total) by mouth daily.   Continuous Blood Gluc Receiver (FREESTYLE LIBRE 2 READER) DEVI Use with sensors to monitor sugar continuously   Continuous Blood Gluc Sensor (FREESTYLE LIBRE 2 SENSOR) MISC Apply sensor every 14 days to monitor sugar continously   cyclobenzaprine (FLEXERIL) 5 MG tablet Take 1 tablet (5 mg total) by mouth 3 (three) times daily as needed for muscle spasms (sedation caution).   diclofenac Sodium (VOLTAREN) 1 % GEL Apply 2-4 g topically 4 (four) times daily.   famotidine (PEPCID) 20 MG tablet TAKE ONE TABLET BY MOUTH EVERYDAY AT BEDTIME   gabapentin (NEURONTIN) 300 MG capsule 1 tab in the AM and 2 in the PM, sedation caution.   glimepiride (AMARYL) 4 MG tablet TAKE ONE TABLET BY MOUTH TWICE DAILY   Insulin Glargine-Lixisenatide (SOLIQUA) 100-33 UNT-MCG/ML SOPN Inject 42 Units into the skin daily.   isosorbide mononitrate (IMDUR) 30 MG 24 hr tablet TAKE ONE TABLET BY MOUTH EVERYDAY AT BEDTIME   losartan (COZAAR) 100 MG tablet TAKE ONE TABLET BY MOUTH EVERYDAY AT BEDTIME   metFORMIN (GLUCOPHAGE) 500 MG tablet TAKE ONE TABLET BY MOUTH TWICE DAILY   nitroGLYCERIN (NITROSTAT) 0.4 MG SL tablet Place 1 tablet (0.4 mg total) under the tongue every 5 (five) minutes  as needed for chest pain.   ondansetron (ZOFRAN) 4 MG tablet Take 4 mg by mouth every 8 (eight) hours as needed.   oxyCODONE-acetaminophen (PERCOCET/ROXICET) 5-325 MG tablet Take 1 tablet by mouth 4 (four) times daily.   pantoprazole (PROTONIX) 40 MG tablet TAKE ONE TABLET BY MOUTH EVERY MORNING   tamsulosin (FLOMAX) 0.4 MG CAPS capsule TAKE ONE CAPSULE BY MOUTH EVERY MORNING and TAKE ONE CAPSULE BY MOUTH EVERYDAY AT BEDTIME   No facility-administered encounter medications on file as of 06/23/2022.   Tony Manning was contacted to remind of upcoming telephone visit with Charlene Brooke on 06/26/22 at 1:00. Patient was reminded to have any blood glucose and blood pressure readings available for review at appointment.   Message was left reminding patient of appointment.  CCM referral has been placed prior to visit?  No   Star Rating Drugs: Medication:  Last Fill: Day Supply Atorvastatin 40 mg 05/29/22 30 Glimepiride 4 mg 05/29/22 30 Losartan 100 mg 05/29/22 30 Metformin 500 mg 05/29/22 [place], CPP notified  Marijean Niemann, [place] Pharmacy Assistant [phone]

## 2022-06-24 ENCOUNTER — Telehealth: Payer: Self-pay

## 2022-06-24 DIAGNOSIS — E1149 Type 2 diabetes mellitus with other diabetic neurological complication: Secondary | ICD-10-CM | POA: Diagnosis not present

## 2022-06-24 DIAGNOSIS — Z794 Long term (current) use of insulin: Secondary | ICD-10-CM | POA: Diagnosis not present

## 2022-06-24 DIAGNOSIS — M47812 Spondylosis without myelopathy or radiculopathy, cervical region: Secondary | ICD-10-CM | POA: Diagnosis not present

## 2022-06-24 NOTE — Progress Notes (Signed)
Chronic Care Management Pharmacy Assistant   Name: KENZIE FLAKES  MRN: [id number] DOB: 07-29-1961  Reason for Encounter: CCM (Medication Adherence and Delivery Coordination)  Recent office visits:  06/19/22 Crawford Givens, MD Right leg pain Order: DG Lumbar Spine Change: gabapentin (NEURONTIN) 300 MG capsule  06/12/22 Crawford Givens, MD DM A1c: 8.0 Change: gabapentin 300 MG capsule Change: Insulin Glargine-Lixisenatide (SOLIQUA) 100-33 UNT-MCG/ML SOPN Referral: Pain Clinic FU 3 months  Recent consult visits:  None since last CCM contact  Hospital visits:  None in previous 6 months  Medications: Outpatient Encounter Medications as of 06/24/2022  Medication Sig   amLODipine (NORVASC) 5 MG tablet Take 1 tablet (5 mg total) by mouth every morning.   aspirin 81 MG chewable tablet Chew 81 mg by mouth at bedtime.   atorvastatin (LIPITOR) 40 MG tablet TAKE ONE TABLET BY MOUTH EVERY MORNING   Blood Glucose Monitoring Suppl (ONETOUCH VERIO) w/Device KIT Use to check blood sugars TID. Dx: E11.9   carvedilol (COREG) 6.25 MG tablet TAKE ONE TABLET BY MOUTH TWICE DAILY   Cholecalciferol (VITAMIN D3) 50 MCG (2000 UT) capsule Take 1 capsule (2,000 Units total) by mouth daily.   Continuous Blood Gluc Receiver (FREESTYLE LIBRE 2 READER) DEVI Use with sensors to monitor sugar continuously   Continuous Blood Gluc Sensor (FREESTYLE LIBRE 2 SENSOR) MISC Apply sensor every 14 days to monitor sugar continously   cyclobenzaprine (FLEXERIL) 5 MG tablet Take 1 tablet (5 mg total) by mouth 3 (three) times daily as needed for muscle spasms (sedation caution).   diclofenac Sodium (VOLTAREN) 1 % GEL Apply 2-4 g topically 4 (four) times daily.   famotidine (PEPCID) 20 MG tablet TAKE ONE TABLET BY MOUTH EVERYDAY AT BEDTIME   gabapentin (NEURONTIN) 300 MG capsule 1 tab in the AM and 2 in the PM, sedation caution.   glimepiride (AMARYL) 4 MG tablet TAKE ONE TABLET BY MOUTH TWICE DAILY   Insulin  Glargine-Lixisenatide (SOLIQUA) 100-33 UNT-MCG/ML SOPN Inject 42 Units into the skin daily.   isosorbide mononitrate (IMDUR) 30 MG 24 hr tablet TAKE ONE TABLET BY MOUTH EVERYDAY AT BEDTIME   losartan (COZAAR) 100 MG tablet TAKE ONE TABLET BY MOUTH EVERYDAY AT BEDTIME   metFORMIN (GLUCOPHAGE) 500 MG tablet TAKE ONE TABLET BY MOUTH TWICE DAILY   nitroGLYCERIN (NITROSTAT) 0.4 MG SL tablet Place 1 tablet (0.4 mg total) under the tongue every 5 (five) minutes as needed for chest pain.   ondansetron (ZOFRAN) 4 MG tablet Take 4 mg by mouth every 8 (eight) hours as needed.   oxyCODONE-acetaminophen (PERCOCET/ROXICET) 5-325 MG tablet Take 1 tablet by mouth 4 (four) times daily.   pantoprazole (PROTONIX) 40 MG tablet TAKE ONE TABLET BY MOUTH EVERY MORNING   tamsulosin (FLOMAX) 0.4 MG CAPS capsule TAKE ONE CAPSULE BY MOUTH EVERY MORNING and TAKE ONE CAPSULE BY MOUTH EVERYDAY AT BEDTIME   No facility-administered encounter medications on file as of 06/24/2022.   BP Readings from Last 3 Encounters:  06/19/22 (!) 136/58  06/12/22 120/62  05/06/22 136/61    Lab Results  Component Value Date   HGBA1C 8.0 (A) 06/12/2022    Recent OV, Consult or Hospital visit:  Recent medication changes indicated:   Change: gabapentin (NEURONTIN) 300 MG capsule  Change: Insulin Glargine-Lixisenatide (SOLIQUA) 100-33 UNT-MCG/ML SOPN  Last adherence delivery date: 06/04/2022      Patient is due for next adherence delivery on: 07/06/2022  Spoke with patient on 06/24/2022 reviewed medications and coordinated delivery.  This delivery to  include: Adherence Packaging  30 Days  Packs: Amlodipine 10mg - 1 tablet daily (1 breakfast) Aspirin 81mg - 1 tablet daily (breakfast) Metformin 500mg  1 tablet twice daily (1 tablet  breakfast, 1 tablet evening meal) Carvedilol 6.25mg - 1 tablet twice daily (1 breakfast, 1 evening meal) Isosorbide 30mg - 1 tablet daily (1 bedtime) Tamsulosin 0.4mg  2 capsule daily (1 breakfast, 1 bedtime)    Losartan 100 mg- 1 tablet daily- (1 bedtime) Atorvastatin 40mg - 1 tablet daily (1 breakfast) Glimepiride 4mg - 1 tablet twice daily (1 breakfast, 1 evening meal) Pantoprazole 40 mg- 1 tablet daily (1 breakfast)   Vial medications: No vial medications   Auto-refill monthly. Will be sent to patient separately each month: Soliqua 100-33 Unit - Inject 48 units daily in the morning - patient is to keep increasing units until 130 or below.    Patient declined the following medications this month: Gabapentin 300 mg - 1 tablet in morning and 2 tablet @ 8:00 pm (patient time preference so put in vial) - patient has plenty on hand so does not need at this time.    Any concerns about your medications? No   How often do you forget or accidentally miss a dose? Never   Do you use a pillbox? No  Is patient in packaging Yes  If yes  What is the date on your next pill pack? 06/24/2022  Any concerns or issues with your packaging? No  Refills requested from providers include: Complete Glimepiride 4mg - 1 tablet twice daily (1 breakfast, 1 evening meal) Pantoprazole 40 mg- 1 tablet daily (1 breakfast)  Confirmed delivery date of 07/06/22, advised patient that pharmacy will contact them the morning of delivery.   Recent blood pressure readings are as follows: Patient was in the car.   Recent blood glucose readings are as follows: Patient was in the car.   Annual wellness visit in last year? No Most Recent BP reading: 136/58 on 06/19/22   If Diabetic: Most recent A1C reading: 7.2 on 12/23/2021 Last eye exam / retinopathy screening: 01/13/2021 Last diabetic foot exam: Up to date  Cycle dispensing form sent to [facility], CPP for review.

## 2022-06-26 ENCOUNTER — Ambulatory Visit: Payer: Medicare HMO | Admitting: Pharmacist

## 2022-06-26 DIAGNOSIS — E7800 Pure hypercholesterolemia, unspecified: Secondary | ICD-10-CM

## 2022-06-26 DIAGNOSIS — I10 Essential (primary) hypertension: Secondary | ICD-10-CM

## 2022-06-26 DIAGNOSIS — Z794 Long term (current) use of insulin: Secondary | ICD-10-CM

## 2022-06-26 DIAGNOSIS — N189 Chronic kidney disease, unspecified: Secondary | ICD-10-CM

## 2022-06-26 NOTE — Patient Instructions (Signed)
Visit Information  Phone number for Pharmacist: [phone]   Goals Addressed   None     Care Plan : [place]  Updates made by Charlton Haws, RPH since 06/26/2022 12:00 AM     Problem: Hypertension, Hyperlipidemia, Diabetes, Coronary Artery Disease, and Chronic Kidney Disease   Priority: High     Long-Range Goal: Disease mgmt   Start Date: 11/27/2020  Expected End Date: 11/22/2022  This Visit's Progress: On track  Recent Progress: On track  Priority: High  Note:   Current Barriers:  Uncontrolled diabetes  Pharmacist Clinical Goal(s):  Patient will adhere to plan to optimize therapeutic regimen for diabetes as evidenced by report of adherence to recommended medication management changes through collaboration with PharmD and provider.   Interventions: 1:1 collaboration with Tonia Ghent, MD regarding development and update of comprehensive plan of care as evidenced by provider attestation and co-signature Inter-disciplinary care team collaboration (see longitudinal plan of care) Comprehensive medication review performed; medication list updated in electronic medical record  Hypertension / CKD Stage 3b (BP goal <130/80) -Controlled - BP at goal in office  -Current treatment: Amodipine 5 mg daily - Appropriate, Effective, Safe, Accessible Carvedilol 6.25 mg BID -Appropriate, Effective, Safe, Accessible Isosorbide MN 30 mg HS -Appropriate, Effective, Safe, Accessible Losartan 100 mg daily -Appropriate, Effective, Safe, Accessible -Medications previously tried: n/a  -Educated on BP goals and benefits of medications for prevention of heart attack, stroke and kidney damage; -Counseled to monitor BP at home periodically, document, and provide log at future appointments -Recommended to continue current medication  Hyperlipidemia / CAD (LDL goal < 70) -Controlled - LDL 59 -Hx CAD. NSTEMI 06/2014 -Current treatment: Atorvastatin 40 mg daily -Appropriate,  Effective, Safe, Accessible Aspirin 81 mg daily -Appropriate, Effective, Safe, Accessible Isosorbide MN 30 mg daily -Appropriate, Effective, Safe, Accessible Nitroglycerin 0.4 mg SL prn -Appropriate, Effective, Safe, Accessible -Medications previously tried: none  -Recommended to continue current medication  Diabetes (A1c goal <7%) -Not ideally controlled - A1c 8.0% (06/2022); pt took last dose of Ozempic 7/30, he cannot afford to refill Ozempic in [place] so was switched to [place] ($35 copay) 8/18. -He reports he eats 1 meal/day typically late in the day (5-7 pm) and drinks a Cheerwine in the morning "to get going"; reviewed AGP report (below) - BG is elevated 9am-12pm likely due to AM Cheerwine, advised pt to switch to diet or other sugar-free alternative, pt is not sure he will do this -Home BG readings:  Previous AGP report: 05/28/22 to 06/10/22. Sensor active: 81%             Time in range (70-180): 33% (goal > 70%)             High (>180): 66%             Low (< 70): 0% (goal < 4%)             GMI: 8.4%; Average glucose: 213    -Current medications: Glimepiride 4 mg BID - Appropriate, Query Effective, Metformin 500 mg BID -Appropriate, Query Effective, Soliqua 52 units daily AM - Appropriate, Query Effective, Freestyle Libre 2 ([place]) -Medications previously tried: Novolin N 20 u BID, Ozempic (cost in [place]), Tresiba -Reviewed AGP report, sugar control is worse the past 2 weeks likely due to cortisone injection on 9/5 and persistent neck pain -Reviewed medication options - alternate GLP-1 RA is not affordable/feasible right now. Can consider SGLT2-inhibitor, however these will also be expensive in  [place]ut [place]. Affordable options include Actos, Prandin, or bolus insulin.  -Also discussed diet changes - currently he eats one large meal/snacks in the evening/nighttime which explains why fasting sugars are elevated, and he will occasionally drop into 70s in mid-afternoon because he  does not usually eat much in AM/lunch. Advised pt to try to eat more regularly throughout the day to limit overnight highs and midday lows. -Recommended to increase Soliqua by 2 units every 3 days until fasting BG ~130 (up to 60 units - max dose). Stop titration if he develops any hypoglycemia. Try to eat more regular meals/snacks throughout the day instead of one large meal at night.  Chronic Kidney Disease Stage 3b  -All medications assessed for renal dosing and appropriateness in chronic kidney disease. -Recommended to continue current medication  Chronic pain (Goal: manage symptoms) -Controlled -cervical radiculitis, sciatica, osteoarthritis. Injections from other did not help, going to pain mgmt. -Current treatment  Oxycodone-APAP 5-325 mg - Appropriate, Effective, Safe, Accessible Cyclobenzaprine 5 mg TID prn -Appropriate, Effective, Safe, Accessible Gabapentin 300 mg -Appropriate, Effective, Safe, Accessible -Medications previously tried: n/a  -Recommended to continue current medication  Health Maintenance -Vaccine gaps: Shingrix  Patient Goals/Self-Care Activities Patient will:  - take medications as prescribed as evidenced by patient report and record review focus on medication adherence by routine check glucose using CGM, document, and provide at future appointments      Patient verbalizes understanding of instructions and care plan provided today and agrees to view in [place]. Active MyChart status and patient understanding of how to access instructions and care plan via MyChart confirmed with patient.    Telephone follow up appointment with pharmacy team member scheduled for: 1 month  Charlene Brooke, PharmD, [facility] Clinical Pharmacist [facility] [phone]

## 2022-06-26 NOTE — Progress Notes (Signed)
Chronic Care Management Pharmacy Note 06/26/22 Name:  Tony Manning   MRN :  [id number] DOB:  [birth year]  Summary: CCM F/U visit -DM: A1c 8.0% (10/23); pt is up to 52 units of Soliqua; he is not wearing CGM right now as he is waiting on shipment of Freestyle Libre 3 - delivery tomorrow per tracking info. He reports fasting BG 129 this AM, but typically has been 180-200s. -Pt notes Willeen Niece is not nearly as effective as Ozempic; he cannot afford Ozempic until insurance resets in January  Recommendations:  -Advised max dose of Soliqua is 60 units, do not exceed this. Plan to continue Soliqua through end of year and then switch back to [facility] in January   Plan:  -Pharmacist follow up televisit scheduled for 4 weeks    Subjective: Tony Manning is an 61 y.o. year old male who is a primary patient of [name], Elveria Rising, MD.  The CCM team was consulted for assistance with disease management and care coordination needs.    Engaged with patient by telephone for follow up visit in response to provider referral for pharmacy case management and/or care coordination services.   Consent to Services:  The patient was given information about Chronic Care Management services, agreed to services, and gave verbal consent prior to initiation of services.  Please see initial visit note for detailed documentation.   Patient Care Team: Tonia Ghent, MD as PCP - General (Family Medicine) Adrian Prows, MD as Consulting Physician (Cardiology) Charlton Haws, [facility] as Pharmacist (Pharmacist)  Recent office visits: 06/19/22 Dr [name] OV: f/u - waiting on meter replacement. Leg pain - Increase gabapentin to 300 mg AM and 600 mg PM.  06/12/22 Dr [name] OV: f/u - A1c 8.0%; update about sugars in 2-3 weeks  02/05/22 Dr Diona Browner VV: acute cough - rx'd zpak, codeine cough syrup. 01/09/22 Dr [name] OV: f/u - SOB. Take lasix for weight > 208#. Can try Lyrica 1 AM, 2 PM. 11/19/21 NP Eugenia Pancoast VV:  covid-19. Rx'd Augmentin. 11/14/20 NP Eugenia Pancoast VV: covid-19. Rx'd Lagevrio. 09/05/2021 - [name], MD - Patient presented for Covid positive. Start: Lagevrio. Stop due to patient not taking: oxycodone 06/09/2021 - [name], MD - Patient presented for [facility]. Labs: CMP, A1c, CBC, Lipid, PSA and Vit B. Foot exam completed. Ran out of Ozempic, will restart. A1c stable 7.2%, Kidney function declined, refer to renal clinic. Administered: cyanocobalamin ((VITAMIN B-12)) injection 1,000 mcg. No medication changes. Follow up 3 months.    Recent consult visits:  05/06/22 Dr Einar Gip (Cardiology): f/u HF, doing well, no changes.  02/16/22 NP Tammy Parrett (pulmonary): f/u Sarcoid. D/c pregabalin. D/c lasix, Kcl. Ordered PFTs.  01/05/22 PA Lawerance Cruel (cardiology): SOB/edema. Rx'd furosemide 20 mg and Kcl 10 mEq. Reduced amlodipine to 5 mg.  10/02/21 Dr Vikki Ports (psychology): chronic pain  - stable for spinal cord stimulator trial   Hospital visits:  None in previous 6 months  Objective:  Lab Results  Component Value Date   CREATININE 1.56 (H) 01/09/2022   BUN 19 01/09/2022   GFR 47.53 (L) 01/09/2022   GFRNONAA 54 (L) 11/12/2020   GFRAA 42 (L) 05/05/2019   NA 140 01/09/2022   K 4.9 01/09/2022   CALCIUM 9.6 01/09/2022   CO2 29 01/09/2022   GLUCOSE 156 (H) 01/09/2022    Lab Results  Component Value Date/Time   HGBA1C 8.0 (A) 06/12/2022 07:57 AM   HGBA1C 7.2 (H) 12/23/2021 11:47 AM   HGBA1C 7.2 (  H) 06/09/2021 09:23 AM   GFR 47.53 (L) 01/09/2022 12:05 PM   GFR 60.28 12/23/2021 11:47 AM   MICROALBUR 1.5 08/15/2012 09:05 AM    Last diabetic Eye exam:  Lab Results  Component Value Date/Time   HMDIABEYEEXA Retinopathy (A) 05/15/2022 12:00 AM    Last diabetic Foot exam:  10/25/20 normal   Lab Results  Component Value Date   CHOL 112 06/09/2021   HDL 40.20 06/09/2021   LDLCALC 57 06/09/2021   TRIG 73.0 06/09/2021   CHOLHDL 3 06/09/2021       Latest Ref Rng  & Units 06/09/2021    9:23 AM 01/27/2021   12:40 PM 03/05/2020    2:46 PM  Hepatic Function  Total Protein 6.0 - 8.3 g/dL 6.8  7.5  7.5   Albumin 3.5 - 5.2 g/dL 4.3  4.8  4.6   AST 0 - 37 U/L $Remo'18  20  20   'QdSpu$ ALT 0 - 53 U/L $Remo'23  21  21   'FNYln$ Alk Phosphatase 39 - 117 U/L 90  78  67   Total Bilirubin 0.2 - 1.2 mg/dL 0.5  0.6  0.5   Bilirubin, Direct 0.0 - 0.3 mg/dL  0.1      Lab Results  Component Value Date/Time   TSH 1.34 01/27/2021 12:40 PM   TSH 2.22 03/21/2018 12:05 PM       Latest Ref Rng & Units 12/23/2021   11:47 AM 06/09/2021    9:23 AM 01/27/2021   12:40 PM  CBC  WBC 4.0 - 10.5 K/uL 5.9  7.2  7.9   Hemoglobin 13.0 - 17.0 g/dL 13.4  13.7  14.0   Hematocrit 39.0 - 52.0 % 39.6  40.9  40.6   Platelets 150.0 - 400.0 K/uL 193.0  198.0  223.0     Lab Results  Component Value Date/Time   VD25OH 23.11 (L) 12/23/2021 11:47 AM   VD25OH 19.37 (L) 03/21/2018 12:05 PM    Clinical ASCVD: Yes  The ASCVD Risk score (Arnett DK, et al., 2019) failed to calculate for the following reasons:   The patient has a prior MI or stroke diagnosis       12/23/2021   11:03 AM 09/20/2020    2:06 PM 04/21/2018    9:57 AM  Depression screen PHQ 2/9  Decreased Interest 0 0 0  Down, Depressed, Hopeless 0 0 0  PHQ - 2 Score 0 0 0  Altered sleeping  0   Tired, decreased energy  0   Change in appetite  0   Feeling bad or failure about yourself   0   Trouble concentrating  0   Moving slowly or fidgety/restless  0   Suicidal thoughts  0   PHQ-9 Score  0   Difficult doing work/chores  Not difficult at all     Social History   Tobacco Use  Smoking Status Never  Smokeless Tobacco Former   Types: Chew   Quit date: 02/06/2016   BP Readings from Last 3 Encounters:  06/19/22 (!) 136/58  06/12/22 120/62  05/06/22 136/61   Pulse Readings from Last 3 Encounters:  06/19/22 70  06/12/22 60  05/06/22 60   Wt Readings from Last 3 Encounters:  06/19/22 216 lb (98 kg)  06/12/22 212 lb (96.2 kg)   05/06/22 207 lb (93.9 kg)   BMI Readings from Last 3 Encounters:  06/19/22 30.99 kg/m  06/12/22 30.42 kg/m  05/06/22 29.70 kg/m    Assessment/Interventions: Review of patient past  medical history, allergies, medications, health status, including review of consultants reports, laboratory and other test data, was performed as part of comprehensive evaluation and provision of chronic care management services.   SDOH:  (Social Determinants of Health) assessments and interventions performed: No - done 11/2021 SDOH Interventions    Flowsheet Row Chronic Care Management from 11/21/2021 in [place] at [place] Management from 12/20/2020 in [place] at [place] Management from 11/27/2020 in [place] at [place] from 09/20/2020 in [place] at [place] Interventions Intervention Not Indicated -- -- --  Depression Interventions/Treatment  -- -- -- LOV5-6 Score <4 Follow-up Not Indicated  Financial Strain Interventions Other (Comment)  [Renew Novo Cares PAP] Other (Comment)  [Ozempic patient assistance approved] --  [Application for med assistance in process] --       SDOH Screenings   Food Insecurity: No Food Insecurity (11/21/2021)  Housing: Low Risk  (09/20/2020)  Transportation Needs: No Transportation Needs (09/20/2020)  Alcohol Screen: Low Risk  (09/20/2020)  Depression (PHQ2-9): Low Risk  (12/23/2021)  Financial Resource Strain: Medium Risk (11/21/2021)  Physical Activity: Inactive (09/20/2020)  Stress: No Stress Concern Present (09/20/2020)  Tobacco Use: Medium Risk (06/19/2022)    [place]  Allergies  Allergen Reactions   Ambien [Zolpidem] Other (See Comments)    Parasomnias, sleep walking   Lisinopril Cough   Tramadol Diarrhea    Medications Reviewed Today     Reviewed by Charlton Haws, [facility] (Pharmacist) on 06/26/22 at [place]  List Status: <None>   Medication Order Taking? Sig Documenting Provider Last Dose Status Informant  amLODipine (NORVASC) 5 MG tablet [id number] Yes Take 1 tablet (5 mg total) by mouth every morning. Cantwell, Celeste C, PA-C Taking Active   aspirin 81 MG chewable tablet [id number] Yes Chew 81 mg by mouth at bedtime. [provider] Taking Active Self  atorvastatin (LIPITOR) 40 MG tablet [id number] Yes TAKE ONE TABLET BY MOUTH EVERY MORNING Tonia Ghent, MD Taking Active   Blood Glucose Monitoring Suppl [facility] VERIO) w/Device KIT [id number] Yes Use to check blood sugars TID. Dx: E11.9 Tonia Ghent, MD Taking Active   carvedilol (COREG) 6.25 MG tablet [id number] Yes TAKE ONE TABLET BY MOUTH TWICE DAILY Adrian Prows, MD Taking Active   Cholecalciferol (VITAMIN D3) 50 MCG (2000 UT) capsule [id number] Yes Take 1 capsule (2,000 Units total) by mouth daily. Tonia Ghent, MD Taking Active   Continuous Blood Gluc Receiver (FREESTYLE LIBRE 2 READER) [place] [id number] Yes Use with sensors to monitor sugar continuously Tonia Ghent, MD Taking Active   Continuous Blood Gluc Sensor (FREESTYLE LIBRE 2 SENSOR) [place] [id number] Yes Apply sensor every 14 days to monitor sugar continously Tonia Ghent, MD Taking Active   cyclobenzaprine (FLEXERIL) 5 MG tablet [id number] Yes Take 1 tablet (5 mg total) by mouth 3 (three) times daily as needed for muscle spasms (sedation caution). Tonia Ghent, MD Taking Active   diclofenac Sodium (VOLTAREN) 1 % GEL [id number] Yes Apply 2-4 g topically 4 (four) times daily. Tonia Ghent, MD Taking Active   famotidine (PEPCID) 20 MG tablet [id number] Yes TAKE ONE TABLET BY MOUTH EVERYDAY AT BEDTIME Parrett, Tammy S, NP Taking Active   gabapentin (NEURONTIN) 300 MG capsule [id number] Yes 1 tab in the AM and 2 in the PM, sedation caution. Tonia Ghent, MD Taking Active   glimepiride [facility]) 4  MG tablet [id number] Yes TAKE ONE TABLET BY MOUTH TWICE DAILY Tonia Ghent, MD Taking Active   Insulin Glargine-Lixisenatide [facility]) 100-33 UNT-MCG/ML SOPN [id number] Yes Inject 42 Units into the skin daily. Tonia Ghent, MD Taking Active   isosorbide mononitrate (IMDUR) 30 MG 24 hr tablet [id number] Yes TAKE ONE TABLET BY MOUTH EVERYDAY AT BEDTIME Adrian Prows, MD Taking Active   losartan (COZAAR) 100 MG tablet [id number] Yes TAKE ONE TABLET BY MOUTH EVERYDAY AT BEDTIME Adrian Prows, MD Taking Active   metFORMIN (GLUCOPHAGE) 500 MG tablet [id number] Yes TAKE ONE TABLET BY MOUTH TWICE DAILY Tonia Ghent, MD Taking Active   nitroGLYCERIN (NITROSTAT) 0.4 MG SL tablet [id number] Yes Place 1 tablet (0.4 mg total) under the tongue every 5 (five) minutes as needed for chest pain. Miquel Dunn, NP Taking Active   ondansetron [facility]) 4 MG tablet [id number] Yes Take 4 mg by mouth every 8 (eight) hours as needed. [provider] Taking Active   oxyCODONE-acetaminophen (PERCOCET/ROXICET) 5-325 MG tablet [id number] Yes Take 1 tablet by mouth 4 (four) times daily. [provider] Taking Active   pantoprazole (PROTONIX) 40 MG tablet [id number] Yes TAKE ONE TABLET BY MOUTH EVERY MORNING Tonia Ghent, MD Taking Active   tamsulosin (FLOMAX) 0.4 MG CAPS capsule [id number] Yes TAKE ONE CAPSULE BY MOUTH EVERY MORNING and TAKE ONE CAPSULE BY MOUTH EVERYDAY AT BEDTIME Tonia Ghent, MD Taking Active             Patient Active Problem List   Diagnosis Date Noted   Acute cough 02/05/2022   Near syncope 12/25/2021   COVID-19 11/14/2021   Advance care planning 06/13/2021   Insomnia 06/13/2021   B12 deficiency 01/30/2021   Fatigue 01/29/2021   Abdominal discomfort 01/05/2021   SOB (shortness of breath) 11/12/2020   Sarcoidosis 11/12/2020   H/O colonoscopy 10/27/2020   Nocturia 05/03/2020   Right sided weakness 03/06/2020   Low back pain 01/29/2020   Facial weakness 11/16/2019   Hematuria 03/22/2018   Renal stones 03/22/2018   Vitamin D  deficiency, unspecified 10/04/2017   Chronic neck pain (Primary Area of Pain) (B) (L>R) 09/21/2017   Chronic upper extremity pain (Secondary Area of Pain) (Bilateral) (L>R) 09/21/2017   Chronic left shoulder pain (Tertiary Area of Pain) 09/21/2017   Chronic low back pain (Fourth Area of Pain) (B) (R>L) 09/21/2017   Right leg pain 09/21/2017   Other long term (current) drug therapy 09/21/2017   Other specified health status 09/21/2017   Other chronic pain 08/22/2017   Other fatigue 05/10/2017   Radiculopathy 04/07/2017   Angina pectoris ([place]) 10/15/2016   Cervical radiculitis 07/23/2016   Dysphagia 06/12/2016   Sciatica 02/06/2016   Plantar fasciitis 01/16/2016   Right sided sciatica 05/16/2015   Hand pain 04/05/2015   Premature ejaculation 09/17/2014   Heel pain 09/17/2014   NSTEMI (non-ST elevated myocardial infarction) ([place]) 06/13/2014   Atypical chest pain 06/06/2014   Memory change 05/24/2014   Benign paroxysmal positional vertigo 07/13/2013   Orthostasis 07/13/2013   Migraine, unspecified, without mention of intractable migraine without mention of status migrainosus 04/03/2013   Pain in joint, ankle and foot 10/19/2012   Irritable mood 01/18/2012   Neck pain 12/03/2010   Hearing loss 12/03/2010   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   HYPERCHOLESTEROLEMIA 10/20/2010   DEPRESSION 10/20/2010   Osteoarthritis 10/20/2010   ARTHRITIS 10/20/2010   NEPHROLITHIASIS, HX OF 10/20/2010   ESSENTIAL HYPERTENSION, BENIGN 09/30/2010   CORONARY  ATHEROSCLEROSIS NATIVE CORONARY ARTERY 09/30/2010   Sleep apnea 09/30/2010   CHEST PAIN UNSPECIFIED 09/30/2010   PERCUTANEOUS TRANSLUMINAL CORONARY ANGIOPLASTY, HX OF 09/30/2010    Immunization History  Administered Date(s) Administered   Fluad Quad(high Dose 65+) 06/12/2022   Influenza Inj Mdck Quad Pf 06/02/2018   Influenza Whole 09/17/2010   Influenza,inj,Quad PF,6+ Mos 06/08/2013, 05/22/2014, 05/12/2016,  05/08/2019, 05/27/2020   Influenza-Unspecified 06/08/2013, 05/22/2014, 06/08/2015, 05/12/2016, 05/21/2017, 06/02/2018, 05/09/2019, 05/21/2021   PFIZER(Purple Top)SARS-COV-2 Vaccination 11/27/2019, 12/25/2019, 08/07/2020, 05/21/2021   Pneumococcal Polysaccharide-23 09/08/2007, 02/16/2013   Td 09/07/2009   Td (Adult), 2 Lf Tetanus Toxid, Preservative Free 09/07/2009   Zoster Recombinat (Shingrix) 05/29/2022    Conditions to be addressed/monitored:  Hypertension, Hyperlipidemia, Diabetes, Coronary Artery Disease, and Chronic Kidney Disease  Care Plan : Sarah Ann  Updates made by Charlton Haws, RPH since 06/26/2022 12:00 AM     Problem: Hypertension, Hyperlipidemia, Diabetes, Coronary Artery Disease, and Chronic Kidney Disease   Priority: High     Long-Range Goal: Disease mgmt   Start Date: 11/27/2020  Expected End Date: 11/22/2022  This Visit's Progress: On track  Recent Progress: On track  Priority: High  Note:   Current Barriers:  Uncontrolled diabetes  Pharmacist Clinical Goal(s):  Patient will adhere to plan to optimize therapeutic regimen for diabetes as evidenced by report of adherence to recommended medication management changes through collaboration with PharmD and provider.   Interventions: 1:1 collaboration with Tonia Ghent, MD regarding development and update of comprehensive plan of care as evidenced by provider attestation and co-signature Inter-disciplinary care team collaboration (see longitudinal plan of care) Comprehensive medication review performed; medication list updated in electronic medical record  Hypertension / CKD Stage 3b (BP goal <130/80) -Controlled - BP at goal in office  -Current treatment: Amodipine 5 mg daily - Appropriate, Effective, Safe, Accessible Carvedilol 6.25 mg BID -Appropriate, Effective, Safe, Accessible Isosorbide MN 30 mg HS -Appropriate, Effective, Safe, Accessible Losartan 100 mg daily -Appropriate,  Effective, Safe, Accessible -Medications previously tried: n/a  -Educated on BP goals and benefits of medications for prevention of heart attack, stroke and kidney damage; -Counseled to monitor BP at home periodically, document, and provide log at future appointments -Recommended to continue current medication  Hyperlipidemia / CAD (LDL goal < 70) -Controlled - LDL 59 -Hx CAD. NSTEMI 06/2014 -Current treatment: Atorvastatin 40 mg daily -Appropriate, Effective, Safe, Accessible Aspirin 81 mg daily -Appropriate, Effective, Safe, Accessible Isosorbide MN 30 mg daily -Appropriate, Effective, Safe, Accessible Nitroglycerin 0.4 mg SL prn -Appropriate, Effective, Safe, Accessible -Medications previously tried: none  -Recommended to continue current medication  Diabetes (A1c goal <7%) -Not ideally controlled - A1c 8.0% (06/2022); pt took last dose of Ozempic 7/30, he cannot afford to refill Ozempic in [place] so was switched to [place] ($35 copay) 8/18. -He reports he eats 1 meal/day typically late in the day (5-7 pm) and drinks a Cheerwine in the morning "to get going"; reviewed AGP report (below) - BG is elevated 9am-12pm likely due to AM Cheerwine, advised pt to switch to diet or other sugar-free alternative, pt is not sure he will do this -Home BG readings:  Previous AGP report: 05/28/22 to 06/10/22. Sensor active: 81%             Time in range (70-180): 33% (goal > 70%)             High (>180): 66%             Low (< 70):  0% (goal < 4%)             GMI: 8.4%; Average glucose: 213    -Current medications: Glimepiride 4 mg BID - Appropriate, Query Effective, Metformin 500 mg BID -Appropriate, Query Effective, Soliqua 52 units daily AM - Appropriate, Query Effective, Freestyle Libre 2 ([place]) -Medications previously tried: Novolin N 20 u BID, Ozempic (cost in [place]), Tresiba -Reviewed AGP report, sugar control is worse the past 2 weeks likely due to cortisone injection on 9/5 and  persistent neck pain -Reviewed medication options - alternate GLP-1 RA is not affordable/feasible right now. Can consider SGLT2-inhibitor, however these will also be expensive in [place]ut [place]. Affordable options include Actos, Prandin, or bolus insulin.  -Also discussed diet changes - currently he eats one large meal/snacks in the evening/nighttime which explains why fasting sugars are elevated, and he will occasionally drop into 70s in mid-afternoon because he does not usually eat much in AM/lunch. Advised pt to try to eat more regularly throughout the day to limit overnight highs and midday lows. -Recommended to increase Soliqua by 2 units every 3 days until fasting BG ~130 (up to 60 units - max dose). Stop titration if he develops any hypoglycemia. Try to eat more regular meals/snacks throughout the day instead of one large meal at night.  Chronic Kidney Disease Stage 3b  -All medications assessed for renal dosing and appropriateness in chronic kidney disease. -Recommended to continue current medication  Chronic pain (Goal: manage symptoms) -Controlled -cervical radiculitis, sciatica, osteoarthritis. Injections from other did not help, going to pain mgmt. -Current treatment  Oxycodone-APAP 5-325 mg - Appropriate, Effective, Safe, Accessible Cyclobenzaprine 5 mg TID prn -Appropriate, Effective, Safe, Accessible Gabapentin 300 mg -Appropriate, Effective, Safe, Accessible -Medications previously tried: n/a  -Recommended to continue current medication  Health Maintenance -Vaccine gaps: Shingrix  Patient Goals/Self-Care Activities Patient will:  - take medications as prescribed as evidenced by patient report and record review focus on medication adherence by routine check glucose using CGM, document, and provide at future appointments     Compliance/Adherence/Medication fill history: Care Gaps: Colonoscopy (due 03/23/2018)  Star-Rating Drugs: Atorvastatin - PDC 100% Glimepiride - PDC  100% Metformin - PDC 100% Losartan - PDC 100%  Medication Assistance:  [place] denied 2023 due to income too high.  Medication Access: Within the past 30 days, how often has patient missed a dose of medication? 0 Is a pillbox or other method used to improve adherence? Yes  Factors that may affect medication adherence? no barriers identified Are meds synced by current pharmacy? Yes  Are meds delivered by current pharmacy? Yes  Does patient experience delays in picking up medications due to transportation concerns? No   [facility] Reviewed: Is patient disadvantaged to use [facility]?: No  Current Rx insurance plan: [employer] [place] Name and location of Current pharmacy:  [facility] - [place], [place] - [address] [place] [address] Phone: [phone] Fax: [phone]  [facility] services reviewed with patient today?: Yes  Packs (30-ds, last delivery 05/30/22) Amlodipine 10mg - 1 tablet daily (1 breakfast) Aspirin 81mg - 1 tablet daily (breakfast) Metformin 500mg  1 tablet twice daily (1 tablet  breakfast, 1 tablet evening meal) Carvedilol 6.25mg - 1 tablet twice daily (1 breakfast, 1 evening meal) Isosorbide 30mg - 1 tablet daily (1 bedtime) Tamsulosin 0.4mg  2 capsule daily (1 breakfast, 1 bedtime)   Losartan 100 mg- 1 tablet daily- (1 bedtime) Atorvastatin 40mg - 1 tablet daily (  1 breakfast) Glimepiride 4mg - 1 tablet twice daily (1 breakfast, 1 evening meal) Pantoprazole 40 mg- 1 tablet daily (1 breakfast) Gabapentin 300 mg - 1 tablet daily (1 bedtime)   Vial medications: No vial medications   Auto-refill monthly. Will be sent to patient separately each month: Soliqua 100-33 Unit - Inject 30 units daily in the morning    Care Plan and Follow Up Patient Decision:  Patient agrees to Care Plan and Follow-up.  Follow Up Plan: Telephone follow up appointment with care management team member  scheduled for: 4 weeks  Charlene Brooke, PharmD, BCACP Clinical Pharmacist [facility] at [facility] [phone]

## 2022-06-29 ENCOUNTER — Encounter: Payer: Self-pay | Admitting: Family Medicine

## 2022-06-29 ENCOUNTER — Ambulatory Visit (INDEPENDENT_AMBULATORY_CARE_PROVIDER_SITE_OTHER): Payer: Medicare HMO | Admitting: Family Medicine

## 2022-06-29 DIAGNOSIS — E1149 Type 2 diabetes mellitus with other diabetic neurological complication: Secondary | ICD-10-CM | POA: Diagnosis not present

## 2022-06-29 DIAGNOSIS — M542 Cervicalgia: Secondary | ICD-10-CM

## 2022-06-29 DIAGNOSIS — Z794 Long term (current) use of insulin: Secondary | ICD-10-CM

## 2022-06-29 MED ORDER — SOLIQUA 100-33 UNT-MCG/ML ~~LOC~~ SOPN: 40.0000 [IU] | PEN_INJECTOR | Freq: Every day | SUBCUTANEOUS | Status: DC

## 2022-06-29 MED ORDER — GABAPENTIN 300 MG PO CAPS: ORAL_CAPSULE | ORAL | Status: DC

## 2022-06-29 MED ORDER — SOLIQUA 100-33 UNT-MCG/ML ~~LOC~~ SOPN: 58.0000 [IU] | PEN_INJECTOR | Freq: Every day | SUBCUTANEOUS | Status: DC

## 2022-06-29 NOTE — Patient Instructions (Signed)
I would decrease soliqua to 40 units a day.  The goal is to prevent any lows.  Please update me about your sugar at the end of this week.  You will likely need a separate insulin shot with PM meals to help with evening sugar elevation.   I would try taking 1 gabapentin in the AM, 1 midday, and 2 at night for pain if that doesn't make you drowsy.   Take care.  Glad to see you.

## 2022-06-29 NOTE — Progress Notes (Unsigned)
Neg DVT u/s.  D/w pt.   Sugar down to 66 on his OV with awareness.  Has been on 58 units soliqua.  Sugar up to 300 in the AMs.  Usually over 200 in the AMs.  But then having lows as above. Corrected with snack at OV.  He feels better with a snack with candy and apple juice.  Sugar up to 121 at OV after snack.    He had neck injections w/o relief this week. Taking '300mg'$  gabapentin in the AM and '600mg'$  in the PM.  He had pain clinic f/u pending.    Meds, vitals, and allergies reviewed.   ROS: Per HPI unless specifically indicated in ROS section   GEN: nad, alert and oriented HEENT: mucous membranes moist NECK: supple w/o LA CV: rrr. PULM: ctab, no inc wob ABD: soft, +bs EXT: no edema SKIN: no acute rash L foot minimally puffy w/o redness or bruising.

## 2022-06-30 DIAGNOSIS — E1149 Type 2 diabetes mellitus with other diabetic neurological complication: Secondary | ICD-10-CM | POA: Diagnosis not present

## 2022-06-30 DIAGNOSIS — Z794 Long term (current) use of insulin: Secondary | ICD-10-CM | POA: Diagnosis not present

## 2022-07-01 NOTE — Assessment & Plan Note (Signed)
Goal to avoid lows.  I would decrease soliqua to 40 units a day.  Asked to please update me about his sugars at the end of this week.  He will likely need a separate insulin shot with PM meals to help with evening sugar elevation.

## 2022-07-01 NOTE — Assessment & Plan Note (Signed)
Discussed trying 1 gabapentin in the AM, 1 midday, and 2 at night for pain if that doesn't make him drowsy.  Sedation caution dw pt.

## 2022-07-03 ENCOUNTER — Telehealth: Payer: Self-pay | Admitting: Family Medicine

## 2022-07-03 NOTE — Telephone Encounter (Signed)
Pt visited office asking to speak to [facility] regarding BP readings off the "[employer]. Pt wanted to know was there a way Mendel Ryder could review the readings for herself? Pt stated [name] told him to report BP readings to [place]. Call back # [phone]

## 2022-07-06 ENCOUNTER — Ambulatory Visit (INDEPENDENT_AMBULATORY_CARE_PROVIDER_SITE_OTHER): Payer: Medicare HMO | Admitting: Family Medicine

## 2022-07-06 ENCOUNTER — Telehealth: Payer: Self-pay | Admitting: Pharmacist

## 2022-07-06 ENCOUNTER — Ambulatory Visit: Payer: Medicare HMO

## 2022-07-06 ENCOUNTER — Encounter: Payer: Self-pay | Admitting: Family Medicine

## 2022-07-06 VITALS — BP 124/64 | HR 58 | Temp 98.3°F | Ht 70.0 in | Wt 217.5 lb

## 2022-07-06 DIAGNOSIS — S060X0A Concussion without loss of consciousness, initial encounter: Secondary | ICD-10-CM | POA: Diagnosis not present

## 2022-07-06 DIAGNOSIS — E1149 Type 2 diabetes mellitus with other diabetic neurological complication: Secondary | ICD-10-CM

## 2022-07-06 NOTE — Telephone Encounter (Signed)
Reviewed Freestyle Libre report per patient request (below). Patient reduced Soliqua from 58 to 40 units on 10/23 per PCP instructions. Patient is still dipping low occasionally in early afternoon and once overnight. High BG still tends to be after dinner and overnight into AM.  PCP has discussed possibility for adding short-acting insulin with evening meal to help with high BG overnight. Would avoid NPH due to 6-8 hour time-to-peak and increased potential for hypoglycemia overnight.  Recommend to reduce Soliqua to 38 units. Recommend adding Novolog 5 units with evening meal.  F/U call with CGM report in 1 week for further titration.   Reviewed AGP report: 06/23/22 to 07/06/22. Sensor active: 62%  Time in range (70-180): 38% (goal > 70%)  High (180-250): 27%  Very high (>250): 33%  Low (< 70): 2% (goal < 4%)  GMI: 8.3%; Average glucose: 208

## 2022-07-06 NOTE — Telephone Encounter (Signed)
Patient is coming in to office today

## 2022-07-06 NOTE — Progress Notes (Signed)
Tony Mcroberts T. Kenyatta Gloeckner, MD, [facility] at [facility] [place], [address]  Phone: [phone]  FAX: [place] - 61 y.o. male  MRN [id number]  Date of Birth: [birth year]  Date: 07/06/2022  PCP: Tonia Ghent, MD  Referral: Tonia Ghent, MD  Chief Complaint  Patient presents with   Fall    Tripped yesterday at work   Headache    Pressure/Pounding and radiates down to jaw   Subjective:   Tony Manning is a 61 y.o. very pleasant male patient with Body mass index is 31.21 kg/m. who presents with the following:  Patient presents as an acute visit for a fall at work.  He reports having headaches and head pressure on the left side.  He presents with an acute concussion visit after traumatic fall at work.  Tripped and remembers lying on the ground.  30-40 minutes after started to get a bad headache.  He does not remember the specifics of this fall or injury, and he does not recall if he hit his head or not.  Right now, he endorses multiple systems, and he is wearing sunglasses in the examination room.  SCAT5 symptom checklist. He endorses 18 out of 22 symptoms 80 out of 132 symptom severity score  He endorses severe headache, pressure in her head, neck pain, sensitivity to light, sensitivity to noise, feeling slowed down, does not feel right, as well as some blurred vision and feeling like he is in a fog. Moderate dizziness, balance problems, concentration, difficulty remembering, fatigue, confusion, drowsiness. He has mild irritability and some difficulty falling asleep.  Cognitive screen: 5/5 orientation Immediate memory 15/15  Concentration, digits backwards: 3/8 correct Months of the year in reverse order correct Concentration 2/5  He does not have full passive range of motion with the neck, secondary to prior neck problems per his report. He has difficulty following VOMS testing  in the office.  NPC 20 cm Very slow on VOMS  Finger-nose normal.  Modified BESS testing, unsteady at beginning with feet together, and immediately unsteady with eyes open on one leg, so I discontinued testing  Review of Systems is noted in the HPI, as appropriate  Objective:   BP 124/64   Pulse (!) 58   Temp 98.3 F (36.8 C) (Oral)   Ht _0  (1.778 m)   Wt 217 lb 8 oz (98.7 kg)   SpO2 97%   BMI 31.21 kg/m   GEN: No acute distress; alert,appropriate. PULM: Breathing comfortably in no respiratory distress PSYCH: Normally interactive.   As above PERRLA, EOMI  Laboratory and Imaging Data:  Assessment and Plan:     ICD-10-CM   1. Concussion without loss of consciousness, initial encounter  S06.0X0A      Total encounter time: 35 minutes. This includes total time spent on the day of encounter.  Extended time spent in concussion evaluation, discussion with the patient, discussion concussion in general.  Patient is got a very significant concussion.  I am going to hold him out of work and limit his physical and cognitive activity until he starts to improve.  He understands that he should not drive, use computerized equipment, tablets, smart phones.  He is going to rest quietly over the next few days.  He will follow-up with me in 2 weeks  Instructions include plan  Patient Instructions  Mild Traumatic Brain Injury (CONCUSSSION):  Owens Loffler, MD, [facility] Adapted  from [place], [employer], and [employer]  Think of the human brain almost as an egg yolk and your skull as an egg shell. When your head or body takes a hit, it can cause your brain to shake around inside your skull, injuring the brain. A concussion is not only caused by a hit to the head, but can also be caused by an impact to the body that ends up shaking your brain in your skull, such as whiplash.  A common misconception about concussion is that one loses  consciousness. However, loss of consciousness occurs in only less than 10% of concussion cases.  Concussive symptoms typically resolve in 7 to 10 days (sports-related concussions) or within 3 months (non-athletes).  Approximately 20% of people have symptoms after 6 weeks.  With concussions, we have learned that it generally takes youth longer to recover from concussions. Another thing that we now know about concussions is that it usually takes male athletes longer to recover than male athletes.  No two concussions are identical. In fact, there are at least six different clinical trajectories that concussions may take.  Signs and Symptoms of Concussion:  The signs and symptoms of a concussion are incredibly important because a concussion doesn't show up on imaging like an x-ray, CT, or MRI scan and there is no objective test, like a blood or saliva test, that can determine if a patient has a concussion. A doctor makes a concussion diagnosis based on the results of a comprehensive examination, which includes observing signs of concussion and patients reporting symptoms of concussion appearing after an impact to the head or body. Concussion signs and symptoms are the brain's way of showing it is injured and not functioning normally.  CONCUSSION SIGNS  Concussion signs are what someone could observe about you to determine if you have a concussion.   Common concussion signs include:  Loss of consciousness Problems with balance Glazed look in the eyes Amnesia Delayed response to questions Forgetting an instruction, confusion about an assignment or position, or  confusion of the game, score, or opponent Inappropriate crying Inappropriate laughter Vomiting  CONCUSSION SYMPTOMS  Concussion symptoms are what someone who is concussed will tell you that they are experiencing. Concussion symptoms typically fall into six major categories:  1- Somatic (Physical)  Symptoms  Headache Light-headedness Dizziness Nausea Sensitivity to light Sensitivity to noise  2- Cognitive Symptoms  Difficulties with attention Memory problems Loss of focus Difficulty multitasking Difficulty completing mental tasks  3- Sleep Symptoms  Sleeping more than usual Sleeping less than usual Having trouble falling asleep  4- Emotional Symptoms  Anxiety Depression Panic attacks  5 - Vestibular Symptoms  The vestibular system is affected in nearly 60% of youth and adolescent athletes following a concussion.  But what is the vestibular system?  It's the sensory system that helps with your sense of balance and spatial orientation. Think of a gyroscope! With help from your inner ears, your vestibular system detects the motion or position of your head in space. It sends information to your brain that's needed for balance and stable vision.  Need an example? If you're moving and looking at moving objects at the same time (think riding in a car), you're able to stay focused and not lose visual clarity. During a vestibular concussion, your gyroscope isn't working at full potential.  Difficulty with balance Dizziness. It may feel like the room is spinning or a slow, wavy sensation. (Like you're on a boat!) Trouble stabilizing  vision when moving your head. (We call this Vestibular-Ocular Reflux, or VOR.) Think back to riding in a car. With a vestibular concussion, you can't stay focused. (The technical name for this is Visual Motion Sensitivity, or VMS.) Triggers  These 3 things could bring on vestibular symptoms:  Dynamic movements Busy environments, like the grocery store Crowds  6 - Oculo-motor  A concussion that affects the ocular, or visual, system of the brain. Typically, patients with ocular-motor concussions report pressure headaches in the front of their head, feeling more tired than normal, and becoming more symptomatic doing math or science  exercises at school.  Patients also experience difficulties with their eyes working together. Follow along to explore some of the most common symptoms.  Convergence  The eyes converge when viewing objects up close, such as with reading. With convergence problems, patients may see a double image as a target moves closer to them. Typically, without a concussion, objects can be brought very close without doubling.  Accommodation  Accommodation problems cause an object to become blurry as it is viewed up close. Accommodative and convergence problems are often experienced together and can impact reading and other near-vision activities.  Pursuits and Saccades  The eyes use pursuit eye movements to follow objects; while saccade eyes movements allow the eyes to shift rapidly from one object to another. Tracking objects, reading a book, scrolling on a computer or even watching for a moving car while crossing the street can be difficult when patients have problems with pursuit and saccade eye movements.  Misalignment  When people with eye misalignment (one eye drifts, eyes aren't perfectly aligned) sustain a concussion, the brain may have difficulty compensating for the misalignment like it once did. This can result in blurry vision, difficulty taking notes in class and focusing on the chalkboard.  Note: This is not an exhaustive list of concussion signs and symptoms, and it may take a few days for concussion symptoms to appear after the initial injury.  TREATMENT:  THE MOST IMPORTANT THING IS REST AS SOON AS POSSIBLE AFTER INJURY SO THAT THE BRAIN CAN RECOVER. COMPLETE PHYSICAL AND MENTAL REST ARE NEEDED INITIALLY.   THAT MEANS: NO SCHOOL OR WORK FOR AT LEAST 3 DAYS AND CLEARED BY YOUR DOCTOR NO MENTAL EXERTION, MEANING NO WORK, NO HOMEWORK, NO TEST TAKING.  Avoid situations with loud noise, bright lights, or crowds. However, this doesn't mean isolate yourself in a dark room for a week. Too  much isolation and boredom can be harmful, contributing to feelings of anxiety, depression, and resulting in increased recovery time. Spend time with friends and family, but monitor your symptoms and avoid situations that make you feel worse.   NO VIDEO GAMES, NO USING THE COMPUTER, NO TEXTING, NO USING SMARTPHONES, NO USE OF AN IPAD OR TABLET. DO NOT GO TO A MOVIE THEATRE OR WATCH SPORTS ON TV. HDTV TENDS TO MAKE PEOPLE FEEL WORSE.   ON APPROXIMATELY DAY 4, BEGIN VERY LIGHT EXERCISE SUCH AS WALKING. DO NOT START ANY STRENUOUS EXERCISE.   You will be given return to school or return to work recommendations.    Disposition: No follow-ups on file.  [employer] speech-to-text software was used for transcription in this dictation.  Possible transcriptional errors can occur using Editor, commissioning.   Signed,  Maud Deed. Nissan Frazzini, MD   Outpatient Encounter Medications as of 07/06/2022  Medication Sig   amLODipine (NORVASC) 5 MG tablet Take 1 tablet (5 mg total) by mouth every morning.  aspirin 81 MG chewable tablet Chew 81 mg by mouth at bedtime.   atorvastatin (LIPITOR) 40 MG tablet TAKE ONE TABLET BY MOUTH EVERY MORNING   Blood Glucose Monitoring Suppl (ONETOUCH VERIO) w/Device KIT Use to check blood sugars TID. Dx: E11.9   carvedilol (COREG) 6.25 MG tablet TAKE ONE TABLET BY MOUTH TWICE DAILY   Cholecalciferol (VITAMIN D3) 50 MCG (2000 UT) capsule Take 1 capsule (2,000 Units total) by mouth daily.   Continuous Blood Gluc Receiver (FREESTYLE LIBRE 2 READER) DEVI Use with sensors to monitor sugar continuously   Continuous Blood Gluc Sensor (FREESTYLE LIBRE 2 SENSOR) MISC Apply sensor every 14 days to monitor sugar continously   cyclobenzaprine (FLEXERIL) 5 MG tablet Take 1 tablet (5 mg total) by mouth 3 (three) times daily as needed for muscle spasms (sedation caution).   diclofenac Sodium (VOLTAREN) 1 % GEL Apply 2-4 g topically 4 (four) times daily.   famotidine (PEPCID) 20 MG  tablet TAKE ONE TABLET BY MOUTH EVERYDAY AT BEDTIME   gabapentin (NEURONTIN) 300 MG capsule 1 tab in the AM, 1 tab middday, and 2 in the PM, sedation caution.   glimepiride (AMARYL) 4 MG tablet TAKE ONE TABLET BY MOUTH TWICE DAILY   Insulin Glargine-Lixisenatide (SOLIQUA) 100-33 UNT-MCG/ML SOPN Inject 40 Units into the skin daily.   isosorbide mononitrate (IMDUR) 30 MG 24 hr tablet TAKE ONE TABLET BY MOUTH EVERYDAY AT BEDTIME   losartan (COZAAR) 100 MG tablet TAKE ONE TABLET BY MOUTH EVERYDAY AT BEDTIME   metFORMIN (GLUCOPHAGE) 500 MG tablet TAKE ONE TABLET BY MOUTH TWICE DAILY   nitroGLYCERIN (NITROSTAT) 0.4 MG SL tablet Place 1 tablet (0.4 mg total) under the tongue every 5 (five) minutes as needed for chest pain.   ondansetron (ZOFRAN) 4 MG tablet Take 4 mg by mouth every 8 (eight) hours as needed.   oxyCODONE-acetaminophen (PERCOCET/ROXICET) 5-325 MG tablet Take 1 tablet by mouth 4 (four) times daily.   pantoprazole (PROTONIX) 40 MG tablet TAKE ONE TABLET BY MOUTH EVERY MORNING   tamsulosin (FLOMAX) 0.4 MG CAPS capsule TAKE ONE CAPSULE BY MOUTH EVERY MORNING and TAKE ONE CAPSULE BY MOUTH EVERYDAY AT BEDTIME   No facility-administered encounter medications on file as of 07/06/2022.

## 2022-07-06 NOTE — Patient Instructions (Signed)
Mild Traumatic Brain Injury (CONCUSSSION):  Owens Loffler, MD, [facility] Adapted from [place], [employer], and [employer]  Think of the human brain almost as an egg yolk and your skull as an egg shell. When your head or body takes a hit, it can cause your brain to shake around inside your skull, injuring the brain. A concussion is not only caused by a hit to the head, but can also be caused by an impact to the body that ends up shaking your brain in your skull, such as whiplash.  A common misconception about concussion is that one loses consciousness. However, loss of consciousness occurs in only less than 10% of concussion cases.  Concussive symptoms typically resolve in 7 to 10 days (sports-related concussions) or within 3 months (non-athletes).  Approximately 20% of people have symptoms after 6 weeks.  With concussions, we have learned that it generally takes youth longer to recover from concussions. Another thing that we now know about concussions is that it usually takes male athletes longer to recover than male athletes.  No two concussions are identical. In fact, there are at least six different clinical trajectories that concussions may take.  Signs and Symptoms of Concussion:  The signs and symptoms of a concussion are incredibly important because a concussion doesn't show up on imaging like an x-ray, CT, or MRI scan and there is no objective test, like a blood or saliva test, that can determine if a patient has a concussion. A doctor makes a concussion diagnosis based on the results of a comprehensive examination, which includes observing signs of concussion and patients reporting symptoms of concussion appearing after an impact to the head or body. Concussion signs and symptoms are the brain's way of showing it is injured and not functioning normally.  CONCUSSION SIGNS  Concussion signs are what someone could observe about you to  determine if you have a concussion.   Common concussion signs include:  Loss of consciousness Problems with balance Glazed look in the eyes Amnesia Delayed response to questions Forgetting an instruction, confusion about an assignment or position, or  confusion of the game, score, or opponent Inappropriate crying Inappropriate laughter Vomiting  CONCUSSION SYMPTOMS  Concussion symptoms are what someone who is concussed will tell you that they are experiencing. Concussion symptoms typically fall into six major categories:  1- Somatic (Physical) Symptoms  Headache Light-headedness Dizziness Nausea Sensitivity to light Sensitivity to noise  2- Cognitive Symptoms  Difficulties with attention Memory problems Loss of focus Difficulty multitasking Difficulty completing mental tasks  3- Sleep Symptoms  Sleeping more than usual Sleeping less than usual Having trouble falling asleep  4- Emotional Symptoms  Anxiety Depression Panic attacks  5 - Vestibular Symptoms  The vestibular system is affected in nearly 60% of youth and adolescent athletes following a concussion.  But what is the vestibular system?  It's the sensory system that helps with your sense of balance and spatial orientation. Think of a gyroscope! With help from your inner ears, your vestibular system detects the motion or position of your head in space. It sends information to your brain that's needed for balance and stable vision.  Need an example? If you're moving and looking at moving objects at the same time (think riding in a car), you're able to stay focused and not lose visual clarity. During a vestibular concussion, your gyroscope isn't working at full potential.  Difficulty with balance Dizziness. It may feel like the room is spinning  or a slow, wavy sensation. (Like you're on a boat!) Trouble stabilizing vision when moving your head. (We call this Vestibular-Ocular Reflux, or VOR.) Think  back to riding in a car. With a vestibular concussion, you can't stay focused. (The technical name for this is Visual Motion Sensitivity, or VMS.) Triggers  These 3 things could bring on vestibular symptoms:  Dynamic movements Busy environments, like the grocery store Crowds  6 - Oculo-motor  A concussion that affects the ocular, or visual, system of the brain. Typically, patients with ocular-motor concussions report pressure headaches in the front of their head, feeling more tired than normal, and becoming more symptomatic doing math or science exercises at school.  Patients also experience difficulties with their eyes working together. Follow along to explore some of the most common symptoms.  Convergence  The eyes converge when viewing objects up close, such as with reading. With convergence problems, patients may see a double image as a target moves closer to them. Typically, without a concussion, objects can be brought very close without doubling.  Accommodation  Accommodation problems cause an object to become blurry as it is viewed up close. Accommodative and convergence problems are often experienced together and can impact reading and other near-vision activities.  Pursuits and Saccades  The eyes use pursuit eye movements to follow objects; while saccade eyes movements allow the eyes to shift rapidly from one object to another. Tracking objects, reading a book, scrolling on a computer or even watching for a moving car while crossing the street can be difficult when patients have problems with pursuit and saccade eye movements.  Misalignment  When people with eye misalignment (one eye drifts, eyes aren't perfectly aligned) sustain a concussion, the brain may have difficulty compensating for the misalignment like it once did. This can result in blurry vision, difficulty taking notes in class and focusing on the chalkboard.  Note: This is not an exhaustive list of concussion  signs and symptoms, and it may take a few days for concussion symptoms to appear after the initial injury.  TREATMENT:  THE MOST IMPORTANT THING IS REST AS SOON AS POSSIBLE AFTER INJURY SO THAT THE BRAIN CAN RECOVER. COMPLETE PHYSICAL AND MENTAL REST ARE NEEDED INITIALLY.   THAT MEANS: NO SCHOOL OR WORK FOR AT LEAST 3 DAYS AND CLEARED BY YOUR DOCTOR NO MENTAL EXERTION, MEANING NO WORK, NO HOMEWORK, NO TEST TAKING.  Avoid situations with loud noise, bright lights, or crowds. However, this doesn't mean isolate yourself in a dark room for a week. Too much isolation and boredom can be harmful, contributing to feelings of anxiety, depression, and resulting in increased recovery time. Spend time with friends and family, but monitor your symptoms and avoid situations that make you feel worse.   NO VIDEO GAMES, NO USING THE COMPUTER, NO TEXTING, NO USING SMARTPHONES, NO USE OF AN IPAD OR TABLET. DO NOT GO TO A MOVIE THEATRE OR WATCH SPORTS ON TV. HDTV TENDS TO MAKE PEOPLE FEEL WORSE.   ON APPROXIMATELY DAY 4, BEGIN VERY LIGHT EXERCISE SUCH AS WALKING. DO NOT START ANY STRENUOUS EXERCISE.   You will be given return to school or return to work recommendations.

## 2022-07-07 ENCOUNTER — Other Ambulatory Visit: Payer: Self-pay | Admitting: Family Medicine

## 2022-07-07 DIAGNOSIS — E1149 Type 2 diabetes mellitus with other diabetic neurological complication: Secondary | ICD-10-CM

## 2022-07-07 MED ORDER — SOLIQUA 100-33 UNT-MCG/ML ~~LOC~~ SOPN: 38.0000 [IU] | PEN_INJECTOR | Freq: Every day | SUBCUTANEOUS | 1 refills | Status: DC

## 2022-07-07 MED ORDER — NOVOLOG FLEXPEN 100 UNIT/ML ~~LOC~~ SOPN: 5.0000 [IU] | PEN_INJECTOR | Freq: Every day | SUBCUTANEOUS | 3 refills | Status: DC

## 2022-07-07 NOTE — Telephone Encounter (Signed)
Agreed and sent.  Thanks.

## 2022-07-09 MED ORDER — FIASP FLEXTOUCH 100 UNIT/ML ~~LOC~~ SOPN: 5.0000 [IU] | PEN_INJECTOR | Freq: Every day | SUBCUTANEOUS | 1 refills | Status: DC

## 2022-07-09 NOTE — Addendum Note (Signed)
Addended by: Charlton Haws on: 07/09/2022 01:33 PM   Modules accepted: Orders

## 2022-07-09 NOTE — Telephone Encounter (Addendum)
Spoke with pharmacy, Novolog is apparently on backorder. Per Gerilyn Nestle is the other preferred rapid-acting insulin. Will change to [place]. Patient has been updated.

## 2022-07-09 NOTE — Telephone Encounter (Signed)
Pt called asking if Mendel Ryder could give him a call, he stated there is a issue with him receiving his insulin. Call back # is [phone]

## 2022-07-12 NOTE — Telephone Encounter (Signed)
Noted. Thanks.

## 2022-07-15 ENCOUNTER — Telehealth: Payer: Self-pay | Admitting: Family Medicine

## 2022-07-15 NOTE — Telephone Encounter (Signed)
LVM for pt to rtn my call to schedule AWV with [facility] call back # [phone] 

## 2022-07-17 ENCOUNTER — Telehealth: Payer: Self-pay

## 2022-07-17 NOTE — Chronic Care Management (AMB) (Signed)
Patient called in to say he is out of [place] and needs delivery today if possible. Acute form uploaded with [facility] for refill and delivery today .  Charlene Brooke, CPP notified  Avel Sensor, [place]  [phone]

## 2022-07-19 NOTE — Progress Notes (Unsigned)
    Tony Grudzien [name], MD, [place] at [facility] [name], [address]  Phone: [phone]  FAX: [place] - 61 y.o. male  MRN [id number]  Date of Birth: 08/05/1961  Date: 07/20/2022  PCP: Tonia Ghent, MD  Referral: Tonia Ghent, MD  No chief complaint on file.  Subjective:   Tony Manning is a 61 y.o. very pleasant male patient with There is no height or weight on file to calculate BMI. who presents with the following:  He is here for concussion follow-up after a fall at work.    07/06/2022 Last OV with Owens Loffler, MD  Patient presents as an acute visit for a fall at work.  He reports having headaches and head pressure on the left side.  He presents with an acute concussion visit after traumatic fall at work.   Tripped and remembers lying on the ground.  30-40 minutes after started to get a bad headache.  He does not remember the specifics of this fall or injury, and he does not recall if he hit his head or not.   Right now, he endorses multiple systems, and he is wearing sunglasses in the examination room.   SCAT5 symptom checklist. He endorses 18 out of 22 symptoms 80 out of 132 symptom severity score   He endorses severe headache, pressure in her head, neck pain, sensitivity to light, sensitivity to noise, feeling slowed down, does not feel right, as well as some blurred vision and feeling like he is in a fog. Moderate dizziness, balance problems, concentration, difficulty remembering, fatigue, confusion, drowsiness. He has mild irritability and some difficulty falling asleep.   Cognitive screen: 5/5 orientation Immediate memory 15/15   Concentration, digits backwards: 3/8 correct Months of the year in reverse order correct Concentration 2/5   He does not have full passive range of motion with the neck, secondary to prior neck problems per his report. He has  difficulty following VOMS testing in the office.   NPC 20 cm Very slow on VOMS   Finger-nose normal.   Modified BESS testing, unsteady at beginning with feet together, and immediately unsteady with eyes open on one leg, so I discontinued testing  Review of Systems is noted in the HPI, as appropriate  Objective:   There were no vitals taken for this visit.  GEN: No acute distress; alert,appropriate. PULM: Breathing comfortably in no respiratory distress PSYCH: Normally interactive.   Laboratory and Imaging Data:  Assessment and Plan:   ***

## 2022-07-20 ENCOUNTER — Encounter: Payer: Self-pay | Admitting: Family Medicine

## 2022-07-20 ENCOUNTER — Ambulatory Visit: Payer: Medicare HMO | Admitting: Family Medicine

## 2022-07-20 ENCOUNTER — Ambulatory Visit (INDEPENDENT_AMBULATORY_CARE_PROVIDER_SITE_OTHER): Payer: Medicare HMO | Admitting: Family Medicine

## 2022-07-20 VITALS — BP 140/70 | HR 61 | Temp 98.4°F | Ht 70.0 in | Wt 215.4 lb

## 2022-07-20 DIAGNOSIS — S060X0D Concussion without loss of consciousness, subsequent encounter: Secondary | ICD-10-CM

## 2022-07-21 ENCOUNTER — Telehealth: Payer: Self-pay | Admitting: Family Medicine

## 2022-07-21 ENCOUNTER — Telehealth: Payer: Self-pay

## 2022-07-21 NOTE — Progress Notes (Signed)
Patient called requesting a refill of Gabapentin 300 mg. He would like it sent to [facility].   Charlene Brooke, CPP notified  Marijean Niemann, [place] Clinical Pharmacy Assistant [phone]

## 2022-07-21 NOTE — Telephone Encounter (Signed)
Per chart review gabapentin was prescribed by Dr Wallene Huh [facility]) in September.  Patient needs to request referral from prescriber [phone] )

## 2022-07-21 NOTE — Telephone Encounter (Signed)
Patient has been notified  Charlene Brooke, CPP notified  Marijean Niemann, [place] Pharmacy Assistant [phone]

## 2022-07-21 NOTE — Telephone Encounter (Signed)
Patient called requesting a refill of Gabapentin 300 mg. He would like it sent to [facility].    Charlene Brooke, CPP notified   Marijean Niemann, [place] Clinical Pharmacy Assistant [phone]

## 2022-07-21 NOTE — Telephone Encounter (Signed)
Left message for patient to schedule Annual Wellness Visit.  Please schedule with Nurse Health Advisor Denisa O'Brien-Blaney, LPN  This appt can be telephone visit.  Please call [phone] ask for [facility]

## 2022-07-21 NOTE — Progress Notes (Signed)
    Chronic Care Management Pharmacy Assistant   Name: JALEEL ALLEN  MRN: [id number] DOB: [birth year]  Reason for Encounter: CCM (Appointment Reminder)  Medications: Outpatient Encounter Medications as of 07/21/2022  Medication Sig   amLODipine (NORVASC) 5 MG tablet Take 1 tablet (5 mg total) by mouth every morning.   aspirin 81 MG chewable tablet Chew 81 mg by mouth at bedtime.   atorvastatin (LIPITOR) 40 MG tablet TAKE ONE TABLET BY MOUTH EVERY MORNING   Blood Glucose Monitoring Suppl (ONETOUCH VERIO) w/Device KIT Use to check blood sugars TID. Dx: E11.9   carvedilol (COREG) 6.25 MG tablet TAKE ONE TABLET BY MOUTH TWICE DAILY   Cholecalciferol (VITAMIN D3) 50 MCG (2000 UT) capsule Take 1 capsule (2,000 Units total) by mouth daily.   Continuous Blood Gluc Receiver (FREESTYLE LIBRE 2 READER) DEVI Use with sensors to monitor sugar continuously   Continuous Blood Gluc Sensor (FREESTYLE LIBRE 2 SENSOR) MISC Apply sensor every 14 days to monitor sugar continously   cyclobenzaprine (FLEXERIL) 5 MG tablet Take 1 tablet (5 mg total) by mouth 3 (three) times daily as needed for muscle spasms (sedation caution).   diclofenac Sodium (VOLTAREN) 1 % GEL Apply 2-4 g topically 4 (four) times daily.   famotidine (PEPCID) 20 MG tablet TAKE ONE TABLET BY MOUTH EVERYDAY AT BEDTIME   gabapentin (NEURONTIN) 300 MG capsule 1 tab in the AM, 1 tab middday, and 2 in the PM, sedation caution.   glimepiride (AMARYL) 4 MG tablet TAKE ONE TABLET BY MOUTH TWICE DAILY   insulin aspart (FIASP FLEXTOUCH) 100 UNIT/ML FlexTouch Pen Inject 5 Units into the skin daily with supper.   Insulin Glargine-Lixisenatide (SOLIQUA) 100-33 UNT-MCG/ML SOPN Inject 38 Units into the skin daily.   isosorbide mononitrate (IMDUR) 30 MG 24 hr tablet TAKE ONE TABLET BY MOUTH EVERYDAY AT BEDTIME   losartan (COZAAR) 100 MG tablet TAKE ONE TABLET BY MOUTH EVERYDAY AT BEDTIME   metFORMIN (GLUCOPHAGE) 500 MG tablet TAKE ONE TABLET BY MOUTH  TWICE DAILY   nitroGLYCERIN (NITROSTAT) 0.4 MG SL tablet Place 1 tablet (0.4 mg total) under the tongue every 5 (five) minutes as needed for chest pain.   ondansetron (ZOFRAN) 4 MG tablet Take 4 mg by mouth every 8 (eight) hours as needed.   oxyCODONE-acetaminophen (PERCOCET/ROXICET) 5-325 MG tablet Take 1 tablet by mouth 4 (four) times daily.   pantoprazole (PROTONIX) 40 MG tablet TAKE ONE TABLET BY MOUTH EVERY MORNING   tamsulosin (FLOMAX) 0.4 MG CAPS capsule TAKE ONE CAPSULE BY MOUTH EVERY MORNING and TAKE ONE CAPSULE BY MOUTH EVERYDAY AT BEDTIME   No facility-administered encounter medications on file as of 07/21/2022.   SAVALAS MONJE was contacted to remind of upcoming telephone visit with Charlene Brooke on 07/24/2022 at 11:45. Patient was reminded to have any blood glucose and blood pressure readings available for review at appointment.   Message was left reminding patient of appointment.  CCM referral has been placed prior to visit?  No   Star Rating Drugs: Medication:  Last Fill: Day Supply Atorvastatin 40 mg 06/30/22 30 Glimepiride 4 mg 06/30/22 30 Losartan 100 mg 06/30/22 30 Metformin 500 mg 06/30/22 [place], CPP notified  Marijean Niemann, [place] Pharmacy Assistant [phone]

## 2022-07-22 ENCOUNTER — Telehealth: Payer: Self-pay

## 2022-07-22 NOTE — Progress Notes (Signed)
Chronic Care Management Pharmacy Assistant   Name: Tony Manning  MRN: [id number] DOB: 1960-09-14  Reason for Encounter: CCM (Medication Adherence and Delivery Coordination)  Recent office visits:  07/20/22 Owens Loffler, MD Concussion Remain out of work for next weeks; no driving; limit screen use. FU 3 weeks  Recent consult visits:  None since last CCM contact  Hospital visits:  None in previous 6 months  Medications: Outpatient Encounter Medications as of 07/22/2022  Medication Sig   amLODipine (NORVASC) 5 MG tablet Take 1 tablet (5 mg total) by mouth every morning.   aspirin 81 MG chewable tablet Chew 81 mg by mouth at bedtime.   atorvastatin (LIPITOR) 40 MG tablet TAKE ONE TABLET BY MOUTH EVERY MORNING   Blood Glucose Monitoring Suppl (ONETOUCH VERIO) w/Device KIT Use to check blood sugars TID. Dx: E11.9   carvedilol (COREG) 6.25 MG tablet TAKE ONE TABLET BY MOUTH TWICE DAILY   Cholecalciferol (VITAMIN D3) 50 MCG (2000 UT) capsule Take 1 capsule (2,000 Units total) by mouth daily.   Continuous Blood Gluc Receiver (FREESTYLE LIBRE 2 READER) DEVI Use with sensors to monitor sugar continuously   Continuous Blood Gluc Sensor (FREESTYLE LIBRE 2 SENSOR) MISC Apply sensor every 14 days to monitor sugar continously   cyclobenzaprine (FLEXERIL) 5 MG tablet Take 1 tablet (5 mg total) by mouth 3 (three) times daily as needed for muscle spasms (sedation caution).   diclofenac Sodium (VOLTAREN) 1 % GEL Apply 2-4 g topically 4 (four) times daily.   famotidine (PEPCID) 20 MG tablet TAKE ONE TABLET BY MOUTH EVERYDAY AT BEDTIME   gabapentin (NEURONTIN) 300 MG capsule 1 tab in the AM, 1 tab middday, and 2 in the PM, sedation caution.   glimepiride (AMARYL) 4 MG tablet TAKE ONE TABLET BY MOUTH TWICE DAILY   insulin aspart (FIASP FLEXTOUCH) 100 UNIT/ML FlexTouch Pen Inject 5 Units into the skin daily with supper.   Insulin Glargine-Lixisenatide (SOLIQUA) 100-33 UNT-MCG/ML SOPN Inject 38  Units into the skin daily.   isosorbide mononitrate (IMDUR) 30 MG 24 hr tablet TAKE ONE TABLET BY MOUTH EVERYDAY AT BEDTIME   losartan (COZAAR) 100 MG tablet TAKE ONE TABLET BY MOUTH EVERYDAY AT BEDTIME   metFORMIN (GLUCOPHAGE) 500 MG tablet TAKE ONE TABLET BY MOUTH TWICE DAILY   nitroGLYCERIN (NITROSTAT) 0.4 MG SL tablet Place 1 tablet (0.4 mg total) under the tongue every 5 (five) minutes as needed for chest pain.   ondansetron (ZOFRAN) 4 MG tablet Take 4 mg by mouth every 8 (eight) hours as needed.   oxyCODONE-acetaminophen (PERCOCET/ROXICET) 5-325 MG tablet Take 1 tablet by mouth 4 (four) times daily.   pantoprazole (PROTONIX) 40 MG tablet TAKE ONE TABLET BY MOUTH EVERY MORNING   tamsulosin (FLOMAX) 0.4 MG CAPS capsule TAKE ONE CAPSULE BY MOUTH EVERY MORNING and TAKE ONE CAPSULE BY MOUTH EVERYDAY AT BEDTIME   No facility-administered encounter medications on file as of 07/22/2022.   BP Readings from Last 3 Encounters:  07/20/22 (!) 140/70  07/06/22 124/64  06/29/22 (!) 140/78    Lab Results  Component Value Date   HGBA1C 8.0 (A) 06/12/2022     Recent OV, Consult or Hospital visit:  No medication changes indicated  Last adherence delivery date: 07/06/2022      Patient is due for next adherence delivery on: 08/04/2022  Spoke with patient on 07/22/2022 reviewed medications and coordinated delivery.  This delivery to include: Adherence Packaging  30 Days  Packs: Amlodipine 60m- 1 tablet daily (1 breakfast)  Aspirin 55m- 1 tablet daily (breakfast) Metformin 5062m1 tablet twice daily (1 tablet  breakfast, 1 tablet evening meal) Carvedilol 6.2541m1 tablet twice daily (1 breakfast, 1 evening meal) Isosorbide 41m73m tablet daily (1 bedtime) Tamsulosin 0.4mg 60mapsule daily (1 breakfast, 1 bedtime)   Losartan 100 mg- 1 tablet daily- (1 bedtime) Atorvastatin 40mg-60mablet daily (1 breakfast) Glimepiride 4mg- 164mblet twice daily (1 breakfast, 1 evening meal) Pantoprazole 40  mg- 1 tablet daily (1 breakfast)   Vial medications: Gabapentin 300 mg - 1 tablet in morning and 2 tablet @ 8:00 pm (patient time preference so put in vial)   Auto-refill monthly. Will be sent to patient separately each month: Soliqua 100-33 Unit - Inject 48 units daily in the morning - patient is to keep increasing units until 130 or below.    Patient declined the following medications this month: Any concerns about your medications? No   How often do you forget or accidentally miss a dose? Never   Do you use a pillbox? No   Is patient in packaging Yes  If yes  What is the date on your next pill pack? 07/22/2022  Any concerns or issues with your packaging? No  Refills needed: Not complete Gabapentin 300 mg - 1 tablet in morning and 2 tablet @ 8:00 pm (patient time preference so put in vial)  Confirmed delivery date of 08/04/2022, advised patient that pharmacy will contact them the morning of delivery.   Recent blood pressure readings are as follows: Patient does not take blood pressure at home.   Recent blood glucose readings are as follows: LindseyCharlene Brookecess to patient's meter readings.   Annual wellness visit in last year? No Most Recent BP reading: 140/70 on 07/20/2022   If Diabetic: Most recent A1C reading: 7.2 on 12/23/2021 Last eye exam / retinopathy screening: 01/13/2021 Last diabetic foot exam: Up to date  [facility]  LindseyCharlene Brookeotified  [employer]oc[employer] MosMarijean NiemannliPajonalant [phone]

## 2022-07-24 ENCOUNTER — Ambulatory Visit: Payer: Medicare HMO | Admitting: Pharmacist

## 2022-07-24 DIAGNOSIS — I10 Essential (primary) hypertension: Secondary | ICD-10-CM

## 2022-07-24 DIAGNOSIS — E7800 Pure hypercholesterolemia, unspecified: Secondary | ICD-10-CM

## 2022-07-24 DIAGNOSIS — Z794 Long term (current) use of insulin: Secondary | ICD-10-CM

## 2022-07-24 NOTE — Progress Notes (Signed)
Chronic Care Management Pharmacy Note 07/24/2022 Name:  Tony Manning   MRN :  [id number] DOB:  1961/04/23  Summary: CCM F/U visit -DM: A1c 8.0% (10/23); pt has been out of work since 10/30 due to concussion -He reports compliance with Soliqua 38 units daily and Fiasp 5 units with dinner - overnight BG/fasting BG has slightly improved with addition of Fiasp Reviewed AGP report: 07/11/22 to 07/24/22. Sensor active: 95%  Time in range (70-180): 36% (goal > 70%)  High (180-250): 45%  Very high (>250): 19%  Low (< 70): 0% (goal < 4%)  GMI: 8.1%; Average glucose: 201 -Pt notes Tony Manning is not nearly as effective as Ozempic; he cannot afford Ozempic until insurance resets in January  Recommendations:  -Increase Fiasp to 8 units with dinner  Plan:  -Pharmacist follow up televisit scheduled for 1 month -PCP appt 08/07/22    Subjective: Tony Manning is an 61 y.o. year old male who is a primary patient of [name], Elveria Rising, MD.  The CCM team was consulted for assistance with disease management and care coordination needs.    Engaged with patient by telephone for follow up visit in response to provider referral for pharmacy case management and/or care coordination services.   Consent to Services:  The patient was given information about Chronic Care Management services, agreed to services, and gave verbal consent prior to initiation of services.  Please see initial visit note for detailed documentation.   Patient Care Team: Tonia Ghent, MD as PCP - General (Family Medicine) Adrian Prows, MD as Consulting Physician (Cardiology) Charlton Haws, [facility] as Pharmacist (Pharmacist)  Recent office visits: 07/20/22 Dr Lorelei Pont OV: f/u concussion - still very symptomatic. Remain out of work x next 3 weeks. Refrain from driving. Limit screen use. RTC 3 weeks.  07/06/22 Dr Lorelei Pont OV: concussion, fall at work. Hold out of work and limit physical activity - no driving, using  computers or smart phones. F/u 2 weeks.  06/29/22 Dr [name] OV: DM - lows in PM. Reduce Soliqua to 40 units.  06/19/22 Dr [name] OV: f/u - waiting on meter replacement. Leg pain - Increase gabapentin to 300 mg AM and 600 mg PM.  06/12/22 Dr [name] OV: f/u - A1c 8.0%; update about sugars in 2-3 weeks  02/05/22 Dr Diona Browner VV: acute cough - rx'd zpak, codeine cough syrup. 01/09/22 Dr [name] OV: f/u - SOB. Take lasix for weight > 208#. Can try Lyrica 1 AM, 2 PM. 11/19/21 NP Eugenia Pancoast VV: covid-19. Rx'd Augmentin. 11/14/20 NP Eugenia Pancoast VV: covid-19. Rx'd Lagevrio. 09/05/2021 - [name], MD - Patient presented for Covid positive. Start: Lagevrio. Stop due to patient not taking: oxycodone 06/09/2021 - [name], MD - Patient presented for [facility]. Labs: CMP, A1c, CBC, Lipid, PSA and Vit B. Foot exam completed. Ran out of Ozempic, will restart. A1c stable 7.2%, Kidney function declined, refer to renal clinic. Administered: cyanocobalamin ((VITAMIN B-12)) injection 1,000 mcg. No medication changes. Follow up 3 months.    Recent consult visits:  05/06/22 Dr Einar Gip (Cardiology): f/u HF, doing well, no changes.  02/16/22 NP Tammy Parrett (pulmonary): f/u Sarcoid. D/c pregabalin. D/c lasix, Kcl. Ordered PFTs.  01/05/22 PA Lawerance Cruel (cardiology): SOB/edema. Rx'd furosemide 20 mg and Kcl 10 mEq. Reduced amlodipine to 5 mg.  10/02/21 Dr Vikki Ports (psychology): chronic pain  - stable for spinal cord stimulator trial   Hospital visits:  None in previous 6 months  Objective:  Lab Results  Component  Value Date   CREATININE 1.56 (H) 01/09/2022   BUN 19 01/09/2022   GFR 47.53 (L) 01/09/2022   GFRNONAA 54 (L) 11/12/2020   GFRAA 42 (L) 05/05/2019   NA 140 01/09/2022   K 4.9 01/09/2022   CALCIUM 9.6 01/09/2022   CO2 29 01/09/2022   GLUCOSE 156 (H) 01/09/2022    Lab Results  Component Value Date/Time   HGBA1C 8.0 (A) 06/12/2022 07:57 AM   HGBA1C 7.2 (H) 12/23/2021 11:47 AM    HGBA1C 7.2 (H) 06/09/2021 09:23 AM   GFR 47.53 (L) 01/09/2022 12:05 PM   GFR 60.28 12/23/2021 11:47 AM   MICROALBUR 1.5 08/15/2012 09:05 AM    Last diabetic Eye exam:  Lab Results  Component Value Date/Time   HMDIABEYEEXA Retinopathy (A) 05/15/2022 12:00 AM    Last diabetic Foot exam:  10/25/20 normal   Lab Results  Component Value Date   CHOL 112 06/09/2021   HDL 40.20 06/09/2021   LDLCALC 57 06/09/2021   TRIG 73.0 06/09/2021   CHOLHDL 3 06/09/2021       Latest Ref Rng & Units 06/09/2021    9:23 AM 01/27/2021   12:40 PM 03/05/2020    2:46 PM  Hepatic Function  Total Protein 6.0 - 8.3 g/dL 6.8  7.5  7.5   Albumin 3.5 - 5.2 g/dL 4.3  4.8  4.6   AST 0 - 37 U/L _0 ALT 0 - 53 U/L _1 Alk Phosphatase 39 - 117 U/L 90  78  67   Total Bilirubin 0.2 - 1.2 mg/dL 0.5  0.6  0.5   Bilirubin, Direct 0.0 - 0.3 mg/dL  0.1      Lab Results  Component Value Date/Time   TSH 1.34 01/27/2021 12:40 PM   TSH 2.22 03/21/2018 12:05 PM       Latest Ref Rng & Units 12/23/2021   11:47 AM 06/09/2021    9:23 AM 01/27/2021   12:40 PM  CBC  WBC 4.0 - 10.5 K/uL 5.9  7.2  7.9   Hemoglobin 13.0 - 17.0 g/dL 13.4  13.7  14.0   Hematocrit 39.0 - 52.0 % 39.6  40.9  40.6   Platelets 150.0 - 400.0 K/uL 193.0  198.0  223.0     Lab Results  Component Value Date/Time   VD25OH 23.11 (L) 12/23/2021 11:47 AM   VD25OH 19.37 (L) 03/21/2018 12:05 PM    Clinical ASCVD: Yes  The ASCVD Risk score (Arnett DK, et al., 2019) failed to calculate for the following reasons:   The patient has a prior MI or stroke diagnosis       12/23/2021   11:03 AM 09/20/2020    2:06 PM 04/21/2018    9:57 AM  Depression screen PHQ 2/9  Decreased Interest 0 0 0  Down, Depressed, Hopeless 0 0 0  PHQ - 2 Score 0 0 0  Altered sleeping  0   Tired, decreased energy  0   Change in appetite  0   Feeling bad or failure about yourself   0   Trouble concentrating  0   Moving slowly or fidgety/restless  0    Suicidal thoughts  0   PHQ-9 Score  0   Difficult doing work/chores  Not difficult at all     Social History   Tobacco Use  Smoking Status Never  Smokeless Tobacco Former   Types: Chew   Quit date: 02/06/2016   BP  Readings from Last 3 Encounters:  07/20/22 (!) 140/70  07/06/22 124/64  06/29/22 (!) 140/78   Pulse Readings from Last 3 Encounters:  07/20/22 61  07/06/22 (!) 58  06/29/22 67   Wt Readings from Last 3 Encounters:  07/20/22 215 lb 6 oz (97.7 kg)  07/06/22 217 lb 8 oz (98.7 kg)  06/29/22 216 lb (98 kg)   BMI Readings from Last 3 Encounters:  07/20/22 30.90 kg/m  07/06/22 31.21 kg/m  06/29/22 30.99 kg/m    Assessment/Interventions: Review of patient past medical history, allergies, medications, health status, including review of consultants reports, laboratory and other test data, was performed as part of comprehensive evaluation and provision of chronic care management services.   SDOH:  (Social Determinants of Health) assessments and interventions performed: No - done 11/2021 SDOH Interventions    Flowsheet Row Chronic Care Management from 11/21/2021 in [place] at [place] Management from 12/20/2020 in [place] at [place] Management from 11/27/2020 in [place] at [place] from 09/20/2020 in [place] at [place] Interventions Intervention Not Indicated -- -- --  Depression Interventions/Treatment  -- -- -- HYI5-0 Score <4 Follow-up Not Indicated  Financial Strain Interventions Other (Comment)  [Renew Novo Cares PAP] Other (Comment)  [Ozempic patient assistance approved] --  [Application for med assistance in process] --       SDOH Screenings   Food Insecurity: No Food Insecurity (11/21/2021)  Housing: Low Risk  (09/20/2020)  Transportation Needs: No Transportation Needs (09/20/2020)  Alcohol Screen: Low Risk  (09/20/2020)   Depression (PHQ2-9): Low Risk  (12/23/2021)  Financial Resource Strain: Medium Risk (11/21/2021)  Physical Activity: Inactive (09/20/2020)  Stress: No Stress Concern Present (09/20/2020)  Tobacco Use: Medium Risk (07/20/2022)    [place]  Allergies  Allergen Reactions   Ambien [Zolpidem] Other (See Comments)    Parasomnias, sleep walking   Lisinopril Cough   Tramadol Diarrhea    Medications Reviewed Today     Reviewed by Carter Kitten, CMA (Certified Medical Assistant) on 07/20/22 at 1054  Med List Status: <None>   Medication Order Taking? Sig Documenting Provider Last Dose Status Informant  amLODipine (NORVASC) 5 MG tablet [id number]  Take 1 tablet (5 mg total) by mouth every morning. Alethia Berthold, PA-C  Active   aspirin 81 MG chewable tablet [id number]  Chew 81 mg by mouth at bedtime. [provider]  Active Self  atorvastatin (LIPITOR) 40 MG tablet [id number]  TAKE ONE TABLET BY MOUTH EVERY MORNING Tonia Ghent, MD  Active   Blood Glucose Monitoring Suppl [facility] VERIO) w/Device KIT [id number]  Use to check blood sugars TID. Dx: E11.9 Tonia Ghent, MD  Active   carvedilol (COREG) 6.25 MG tablet [id number]  TAKE ONE TABLET BY MOUTH TWICE DAILY Adrian Prows, MD  Active   Cholecalciferol (VITAMIN D3) 50 MCG (2000 UT) capsule [id number]  Take 1 capsule (2,000 Units total) by mouth daily. Tonia Ghent, MD  Active   Continuous Blood Gluc Receiver (FREESTYLE LIBRE 2 READER) DEVI [id number]  Use with sensors to monitor sugar continuously Tonia Ghent, MD  Active   Continuous Blood Gluc Sensor (FREESTYLE LIBRE 2 SENSOR) [place] [id number]  Apply sensor every 14 days to monitor sugar continously Tonia Ghent, MD  Active   cyclobenzaprine (FLEXERIL) 5 MG tablet [id number]  Take 1 tablet (5 mg total) by mouth 3 (three)  times daily as needed for muscle spasms (sedation caution). Tonia Ghent, MD  Active   diclofenac Sodium (VOLTAREN) 1 % GEL [id number]  Apply 2-4  g topically 4 (four) times daily. Tonia Ghent, MD  Active   famotidine (PEPCID) 20 MG tablet [id number]  TAKE ONE TABLET BY MOUTH EVERYDAY AT BEDTIME Parrett, Tammy S, NP  Active   gabapentin (NEURONTIN) 300 MG capsule [id number]  1 tab in the AM, 1 tab middday, and 2 in the PM, sedation caution. Tonia Ghent, MD  Active   glimepiride (AMARYL) 4 MG tablet [id number]  TAKE ONE TABLET BY MOUTH TWICE DAILY Tonia Ghent, MD  Active   insulin aspart (FIASP FLEXTOUCH) 100 UNIT/ML FlexTouch Pen [id number]  Inject 5 Units into the skin daily with supper. Tonia Ghent, MD  Active   Insulin Glargine-Lixisenatide [facility]) 100-33 UNT-MCG/ML SOPN [id number]  Inject 38 Units into the skin daily. Tonia Ghent, MD  Active   isosorbide mononitrate (IMDUR) 30 MG 24 hr tablet [id number]  TAKE ONE TABLET BY MOUTH EVERYDAY AT BEDTIME Adrian Prows, MD  Active   losartan (COZAAR) 100 MG tablet [id number]  TAKE ONE TABLET BY MOUTH EVERYDAY AT BEDTIME Adrian Prows, MD  Active   metFORMIN (GLUCOPHAGE) 500 MG tablet [id number]  TAKE ONE TABLET BY MOUTH TWICE DAILY Tonia Ghent, MD  Active   nitroGLYCERIN (NITROSTAT) 0.4 MG SL tablet [id number]  Place 1 tablet (0.4 mg total) under the tongue every 5 (five) minutes as needed for chest pain. Miquel Dunn, NP  Active   ondansetron [facility]) 4 MG tablet [id number]  Take 4 mg by mouth every 8 (eight) hours as needed. [provider]  Active   oxyCODONE-acetaminophen (PERCOCET/ROXICET) 5-325 MG tablet [id number]  Take 1 tablet by mouth 4 (four) times daily. [provider]  Active   pantoprazole (PROTONIX) 40 MG tablet [id number]  TAKE ONE TABLET BY MOUTH EVERY MORNING Tonia Ghent, MD  Active   tamsulosin (FLOMAX) 0.4 MG CAPS capsule [id number]  TAKE ONE CAPSULE BY MOUTH EVERY MORNING and TAKE ONE CAPSULE BY MOUTH EVERYDAY AT BEDTIME Tonia Ghent, MD  Active             Patient Active Problem List   Diagnosis Date Noted   Acute cough  02/05/2022   Near syncope 12/25/2021   COVID-19 11/14/2021   Advance care planning 06/13/2021   Insomnia 06/13/2021   B12 deficiency 01/30/2021   Fatigue 01/29/2021   Abdominal discomfort 01/05/2021   SOB (shortness of breath) 11/12/2020   Sarcoidosis 11/12/2020   H/O colonoscopy 10/27/2020   Nocturia 05/03/2020   Right sided weakness 03/06/2020   Low back pain 01/29/2020   Facial weakness 11/16/2019   Hematuria 03/22/2018   Renal stones 03/22/2018   Vitamin D deficiency, unspecified 10/04/2017   Chronic neck pain (Primary Area of Pain) (B) (L>R) 09/21/2017   Chronic upper extremity pain (Secondary Area of Pain) (Bilateral) (L>R) 09/21/2017   Chronic left shoulder pain (Tertiary Area of Pain) 09/21/2017   Chronic low back pain (Fourth Area of Pain) (B) (R>L) 09/21/2017   Right leg pain 09/21/2017   Other long term (current) drug therapy 09/21/2017   Other specified health status 09/21/2017   Other chronic pain 08/22/2017   Other fatigue 05/10/2017   Radiculopathy 04/07/2017   Angina pectoris ([place]) 10/15/2016   Cervical radiculitis 07/23/2016   Dysphagia 06/12/2016   Sciatica 02/06/2016   Plantar fasciitis 01/16/2016   Right sided sciatica 05/16/2015  Hand pain 04/05/2015   Premature ejaculation 09/17/2014   Heel pain 09/17/2014   NSTEMI (non-ST elevated myocardial infarction) ([place]) 06/13/2014   Atypical chest pain 06/06/2014   Memory change 05/24/2014   Benign paroxysmal positional vertigo 07/13/2013   Orthostasis 07/13/2013   Migraine, unspecified, without mention of intractable migraine without mention of status migrainosus 04/03/2013   Pain in joint, ankle and foot 10/19/2012   Irritable mood 01/18/2012   Neck pain 12/03/2010   Hearing loss 12/03/2010   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   HYPERCHOLESTEROLEMIA 10/20/2010   DEPRESSION 10/20/2010   Osteoarthritis 10/20/2010   ARTHRITIS 10/20/2010   NEPHROLITHIASIS, HX OF 10/20/2010    ESSENTIAL HYPERTENSION, BENIGN 09/30/2010   CORONARY ATHEROSCLEROSIS NATIVE CORONARY ARTERY 09/30/2010   Sleep apnea 09/30/2010   CHEST PAIN UNSPECIFIED 09/30/2010   PERCUTANEOUS TRANSLUMINAL CORONARY ANGIOPLASTY, HX OF 09/30/2010    Immunization History  Administered Date(s) Administered   Fluad Quad(high Dose 65+) 06/12/2022   Influenza Inj Mdck Quad Pf 06/02/2018   Influenza Whole 09/17/2010   Influenza,inj,Quad PF,6+ Mos 06/08/2013, 05/22/2014, 05/12/2016, 05/08/2019, 05/27/2020   Influenza-Unspecified 06/08/2013, 05/22/2014, 06/08/2015, 05/12/2016, 05/21/2017, 06/02/2018, 05/09/2019, 05/21/2021   PFIZER(Purple Top)SARS-COV-2 Vaccination 11/27/2019, 12/25/2019, 08/07/2020, 05/21/2021   Pneumococcal Polysaccharide-23 09/08/2007, 02/16/2013   Td 09/07/2009   Td (Adult), 2 Lf Tetanus Toxid, Preservative Free 09/07/2009   Zoster Recombinat (Shingrix) 05/29/2022    Conditions to be addressed/monitored:  Hypertension, Hyperlipidemia, Diabetes, Coronary Artery Disease, and Chronic Kidney Disease  Care Plan : [place]  Updates made by Charlton Haws, RPH since 08/03/2022 12:00 AM     Problem: Hypertension, Hyperlipidemia, Diabetes, Coronary Artery Disease, and Chronic Kidney Disease   Priority: High     Long-Range Goal: Disease mgmt   Start Date: 11/27/2020  Expected End Date: 11/22/2022  This Visit's Progress: On track  Recent Progress: On track  Priority: High  Note:   Current Barriers:  Uncontrolled diabetes  Pharmacist Clinical Goal(s):  Patient will adhere to plan to optimize therapeutic regimen for diabetes as evidenced by report of adherence to recommended medication management changes through collaboration with PharmD and provider.   Interventions: 1:1 collaboration with Tonia Ghent, MD regarding development and update of comprehensive plan of care as evidenced by provider attestation and co-signature Inter-disciplinary care team  collaboration (see longitudinal plan of care) Comprehensive medication review performed; medication list updated in electronic medical record  Hypertension / CKD Stage 3b (BP goal <130/80) -Controlled - BP at goal in office  -Current treatment: Amodipine 5 mg daily - Appropriate, Effective, Safe, Accessible Carvedilol 6.25 mg BID -Appropriate, Effective, Safe, Accessible Isosorbide MN 30 mg HS -Appropriate, Effective, Safe, Accessible Losartan 100 mg daily -Appropriate, Effective, Safe, Accessible -Medications previously tried: n/a  -Educated on BP goals and benefits of medications for prevention of heart attack, stroke and kidney damage; -Counseled to monitor BP at home periodically, document, and provide log at future appointments -Recommended to continue current medication  Hyperlipidemia / CAD (LDL goal < 70) -Controlled - LDL 59 -Hx CAD. NSTEMI 06/2014 -Current treatment: Atorvastatin 40 mg daily -Appropriate, Effective, Safe, Accessible Aspirin 81 mg daily -Appropriate, Effective, Safe, Accessible Isosorbide MN 30 mg daily -Appropriate, Effective, Safe, Accessible Nitroglycerin 0.4 mg SL prn -Appropriate, Effective, Safe, Accessible -Medications previously tried: none  -Recommended to continue current medication  Diabetes (A1c goal <7%) -Not ideally controlled - A1c 8.0% (06/2022);  -pt took last dose of Ozempic 7/30, he cannot afford to refill Ozempic in [place]ut [place] so was  switched to [facility] ($35 copay) 8/18. Fiasp added 11/2 to target overnight highs -Home BG readings:  Reviewed AGP report: 07/11/22 to 07/24/22. Sensor active: 95%  Time in range (70-180): 36% (goal > 70%)  High (180-250): 45%  Very high (>250): 19%  Low (< 70): 0% (goal < 4%)  GMI: 8.1%; Average glucose: 201  Previous AGP report: 05/28/22 to 06/10/22. Sensor active: 81%             Time in range (70-180): 33% (goal > 70%)             High (>180): 66%             Low (< 70): 0% (goal < 4%)              GMI: 8.4%; Average glucose: 213    -Current medications: Glimepiride 4 mg BID - Appropriate, Query Effective, Metformin 500 mg BID -Appropriate, Query Effective, Soliqua 38 units daily AM - Appropriate, Query Effective, Fiasp 5 units with dinner -Appropriate, Query Effective, Freestyle Libre 2 ([place]) -Medications previously tried: Novolin N 20 u BID, Ozempic (cost in [place]), Tresiba -Reviewed AGP report, sugar control is worse the past 2 weeks likely due to cortisone injection on 9/5 and persistent neck pain -Reviewed medication options - alternate GLP-1 RA is not affordable/feasible right now. Can consider SGLT2-inhibitor, however these will also be expensive in [place]ut hole. Affordable options include Actos, Prandin, or bolus insulin.  -Also discussed diet changes - currently he eats one large meal/snacks in the evening/nighttime which explains why fasting sugars are elevated, and he will occasionally drop into 70s in mid-afternoon because he does not usually eat much in AM/lunch. Advised pt to try to eat more regularly throughout the day to limit overnight highs and midday lows. -Per AGP report, addition of Fiasp has slightly improved overnight BG/fasting BG -Recommend to increase Fiasp to 8 units with dinner  Chronic Kidney Disease Stage 3b  -All medications assessed for renal dosing and appropriateness in chronic kidney disease. -Recommended to continue current medication  Chronic pain (Goal: manage symptoms) -Controlled -cervical radiculitis, sciatica, osteoarthritis. Injections from other did not help, going to pain mgmt. -Current treatment  Oxycodone-APAP 5-325 mg - Appropriate, Effective, Safe, Accessible Cyclobenzaprine 5 mg TID prn -Appropriate, Effective, Safe, Accessible Gabapentin 300 mg -Appropriate, Effective, Safe, Accessible -Medications previously tried: n/a  -Recommended to continue current medication  Health Maintenance -Vaccine gaps: Shingrix  Patient  Goals/Self-Care Activities Patient will:  - take medications as prescribed as evidenced by patient report and record review focus on medication adherence by routine check glucose using CGM, document, and provide at future appointments    Compliance/Adherence/Medication fill history: Care Gaps: Colonoscopy (due 03/23/2018)  Star-Rating Drugs: Atorvastatin - PDC 100% Glimepiride - PDC 100% Metformin - PDC 100% Losartan - PDC 100%  Medication Assistance:  [place] denied 2023 due to income too high.  Medication Access: Within the past 30 days, how often has patient missed a dose of medication? 0 Is a pillbox or other method used to improve adherence? Yes  Factors that may affect medication adherence? no barriers identified Are meds synced by current pharmacy? Yes  Are meds delivered by current pharmacy? Yes  Does patient experience delays in picking up medications due to transportation concerns? No   [facility] Reviewed: Is patient disadvantaged to use [facility]?: No  Current Rx insurance plan: [place] Name and location of Current pharmacy:  [facility], [place]  [address] [place] [address] Phone: [phone] Fax: [phone]  [facility] services reviewed with patient today?: Yes  Packs (30-ds, delivery 08/04/22) Amlodipine 72m- 1 tablet daily (1 breakfast) Aspirin 868m 1 tablet daily (breakfast) Metformin 50038m tablet twice daily (1 tablet  breakfast, 1 tablet evening meal) Carvedilol 6.91m1m tablet twice daily (1 breakfast, 1 evening meal) Isosorbide 30mg76mtablet daily (1 bedtime) Tamsulosin 0.4mg 238mpsule daily (1 breakfast, 1 bedtime)   Losartan 100 mg- 1 tablet daily- (1 bedtime) Atorvastatin 40mg- 91mblet daily (1 breakfast) Glimepiride 4mg- 1 28mlet twice daily (1 breakfast, 1 evening meal) Pantoprazole 40 mg- 1 tablet daily (1 breakfast)    Vial medications: Gabapentin 300 mg - 1 tablet in morning and 2 tablet @ 8:00 pm (patient time preference so put in vial)   Auto-refill monthly. Will be sent to patient separately each month: [place] Patient Decision:  Patient agrees to Care Plan and Follow-up.  Follow Up Plan: Telephone follow up appointment with care management team member scheduled for: 4 weeks  Refujio Haymer Charlene Brooke, BCACP Clinical Pharmacist [phone]

## 2022-07-29 ENCOUNTER — Other Ambulatory Visit: Payer: Self-pay | Admitting: Family Medicine

## 2022-07-30 DIAGNOSIS — Z794 Long term (current) use of insulin: Secondary | ICD-10-CM | POA: Diagnosis not present

## 2022-07-30 DIAGNOSIS — E1149 Type 2 diabetes mellitus with other diabetic neurological complication: Secondary | ICD-10-CM | POA: Diagnosis not present

## 2022-08-03 NOTE — Patient Instructions (Addendum)
Visit Information  Phone number for Pharmacist: [phone]   Goals Addressed   None     Care Plan : [place]  Updates made by Charlton Haws, RPH since 08/03/2022 12:00 AM     Problem: Hypertension, Hyperlipidemia, Diabetes, Coronary Artery Disease, and Chronic Kidney Disease   Priority: High     Long-Range Goal: Disease mgmt   Start Date: 11/27/2020  Expected End Date: 11/22/2022  This Visit's Progress: On track  Recent Progress: On track  Priority: High  Note:   Current Barriers:  Uncontrolled diabetes  Pharmacist Clinical Goal(s):  Patient will adhere to plan to optimize therapeutic regimen for diabetes as evidenced by report of adherence to recommended medication management changes through collaboration with PharmD and provider.   Interventions: 1:1 collaboration with Tonia Ghent, MD regarding development and update of comprehensive plan of care as evidenced by provider attestation and co-signature Inter-disciplinary care team collaboration (see longitudinal plan of care) Comprehensive medication review performed; medication list updated in electronic medical record  Hypertension / CKD Stage 3b (BP goal <130/80) -Controlled - BP at goal in office  -Current treatment: Amodipine 5 mg daily - Appropriate, Effective, Safe, Accessible Carvedilol 6.25 mg BID -Appropriate, Effective, Safe, Accessible Isosorbide MN 30 mg HS -Appropriate, Effective, Safe, Accessible Losartan 100 mg daily -Appropriate, Effective, Safe, Accessible -Medications previously tried: n/a  -Educated on BP goals and benefits of medications for prevention of heart attack, stroke and kidney damage; -Counseled to monitor BP at home periodically, document, and provide log at future appointments -Recommended to continue current medication  Hyperlipidemia / CAD (LDL goal < 70) -Controlled - LDL 59 -Hx CAD. NSTEMI 06/2014 -Current treatment: Atorvastatin 40 mg daily -Appropriate,  Effective, Safe, Accessible Aspirin 81 mg daily -Appropriate, Effective, Safe, Accessible Isosorbide MN 30 mg daily -Appropriate, Effective, Safe, Accessible Nitroglycerin 0.4 mg SL prn -Appropriate, Effective, Safe, Accessible -Medications previously tried: none  -Recommended to continue current medication  Diabetes (A1c goal <7%) -Not ideally controlled - A1c 8.0% (06/2022);  -pt took last dose of Ozempic 7/30, he cannot afford to refill Ozempic in [place] so was switched to [place] ($35 copay) 8/18. Fiasp added 11/2 to target overnight highs -Home BG readings:  Reviewed AGP report: 07/11/22 to 07/24/22. Sensor active: 95%  Time in range (70-180): 36% (goal > 70%)  High (180-250): 45%  Very high (>250): 19%  Low (< 70): 0% (goal < 4%)  GMI: 8.1%; Average glucose: 201  Previous AGP report: 05/28/22 to 06/10/22. Sensor active: 81%             Time in range (70-180): 33% (goal > 70%)             High (>180): 66%             Low (< 70): 0% (goal < 4%)             GMI: 8.4%; Average glucose: 213    -Current medications: Glimepiride 4 mg BID - Appropriate, Query Effective, Metformin 500 mg BID -Appropriate, Query Effective, Soliqua 38 units daily AM - Appropriate, Query Effective, Fiasp 5 units with dinner -Appropriate, Query Effective, Freestyle Libre 2 ([place]) -Medications previously tried: Novolin N 20 u BID, Ozempic (cost in [place]), Tresiba -Reviewed AGP report, sugar control is worse the past 2 weeks likely due to cortisone injection on 9/5 and persistent neck pain -Reviewed medication options - alternate GLP-1 RA is not affordable/feasible right now. Can consider SGLT2-inhibitor, however these will also  be expensive in [place]ut [place]. Affordable options include Actos, Prandin, or bolus insulin.  -Also discussed diet changes - currently he eats one large meal/snacks in the evening/nighttime which explains why fasting sugars are elevated, and he will occasionally drop into 70s in  mid-afternoon because he does not usually eat much in AM/lunch. Advised pt to try to eat more regularly throughout the day to limit overnight highs and midday lows. -Per AGP report, addition of Fiasp has slightly improved overnight BG/fasting BG -Recommend to increase Fiasp to 8 units with dinner  Chronic Kidney Disease Stage 3b  -All medications assessed for renal dosing and appropriateness in chronic kidney disease. -Recommended to continue current medication  Chronic pain (Goal: manage symptoms) -Controlled -cervical radiculitis, sciatica, osteoarthritis. Injections from other did not help, going to pain mgmt. -Current treatment  Oxycodone-APAP 5-325 mg - Appropriate, Effective, Safe, Accessible Cyclobenzaprine 5 mg TID prn -Appropriate, Effective, Safe, Accessible Gabapentin 300 mg -Appropriate, Effective, Safe, Accessible -Medications previously tried: n/a  -Recommended to continue current medication  Health Maintenance -Vaccine gaps: Shingrix  Patient Goals/Self-Care Activities Patient will:  - take medications as prescribed as evidenced by patient report and record review focus on medication adherence by routine check glucose using CGM, document, and provide at future appointments      Patient verbalizes understanding of instructions and care plan provided today and agrees to view in [place]. Active MyChart status and patient understanding of how to access instructions and care plan via MyChart confirmed with patient.    Telephone follow up appointment with pharmacy team member scheduled for: 1 month  Charlene Brooke, PharmD, [facility] Clinical Pharmacist [place] [phone]

## 2022-08-04 ENCOUNTER — Ambulatory Visit (INDEPENDENT_AMBULATORY_CARE_PROVIDER_SITE_OTHER): Payer: Medicare HMO | Admitting: Family Medicine

## 2022-08-04 ENCOUNTER — Encounter: Payer: Self-pay | Admitting: Family Medicine

## 2022-08-04 VITALS — BP 122/68 | HR 70 | Temp 97.2°F | Ht 70.0 in | Wt 217.0 lb

## 2022-08-04 DIAGNOSIS — S060X0S Concussion without loss of consciousness, sequela: Secondary | ICD-10-CM | POA: Diagnosis not present

## 2022-08-04 DIAGNOSIS — Z794 Long term (current) use of insulin: Secondary | ICD-10-CM

## 2022-08-04 DIAGNOSIS — E1149 Type 2 diabetes mellitus with other diabetic neurological complication: Secondary | ICD-10-CM

## 2022-08-04 MED ORDER — SOLIQUA 100-33 UNT-MCG/ML ~~LOC~~ SOPN: 40.0000 [IU] | PEN_INJECTOR | Freq: Every day | SUBCUTANEOUS | 1 refills | Status: DC

## 2022-08-04 MED ORDER — GABAPENTIN 300 MG PO CAPS: ORAL_CAPSULE | ORAL | Status: DC

## 2022-08-04 MED ORDER — FIASP FLEXTOUCH 100 UNIT/ML ~~LOC~~ SOPN: 8.0000 [IU] | PEN_INJECTOR | Freq: Every day | SUBCUTANEOUS | Status: DC

## 2022-08-04 MED ORDER — CYCLOBENZAPRINE HCL 5 MG PO TABS: 5.0000 mg | ORAL_TABLET | Freq: Three times a day (TID) | ORAL | 2 refills | Status: DC | PRN

## 2022-08-04 NOTE — Patient Instructions (Signed)
Out of work for 3 weeks and don't drive until your vision changes have resolved.   Brain rest in the meantime.  Avoid screens, bright lights, limit exercise if it makes any symptoms worse.   Use the flexeril if needed.   Recheck in about 3 weeks with either Fern Canova or [facility].  Take care.  Glad to see you.

## 2022-08-04 NOTE — Progress Notes (Signed)
Concussion follow up. Photophobia is better (still sensitive to sunlight).  Still with phonophobia.  Still with neck pain.  Has been using flexeril for his neck pain.  Balance is better but still abnormal.  Concentration if better but not back to normal.  He has been out of work in the meantime.  HA with attempt to drive.  Still with blurry vision with sudden head movement.  Discussed neurologic rest, ie avoiding screens, etc.  Still with fatigue.    He tripped at work, then went to ground.    Trouble focusing with finger to nose testing, with resulting slowing.   Single leg balance abnormal, worse on L foot than R.  He reports improvement but not back to baseline.    WORLD---->DLROW Can recall 7 digits forward but not backward.

## 2022-08-05 DIAGNOSIS — S060XAA Concussion with loss of consciousness status unknown, initial encounter: Secondary | ICD-10-CM | POA: Insufficient documentation

## 2022-08-05 NOTE — Assessment & Plan Note (Signed)
Out of work for 3 weeks and don't drive until vision changes have resolved.   Brain rest in the meantime.  Avoid screens, bright lights, limit exercise if it makes any symptoms worse.   Use the flexeril if needed.   Recheck in about 3 weeks with either Inessa Wardrop or [facility].

## 2022-08-07 ENCOUNTER — Ambulatory Visit: Payer: Medicare HMO | Admitting: Family Medicine

## 2022-08-10 ENCOUNTER — Ambulatory Visit: Payer: Medicare HMO | Admitting: Family Medicine

## 2022-08-13 ENCOUNTER — Ambulatory Visit (INDEPENDENT_AMBULATORY_CARE_PROVIDER_SITE_OTHER): Payer: Medicare HMO | Admitting: Family Medicine

## 2022-08-13 ENCOUNTER — Encounter: Payer: Self-pay | Admitting: Family Medicine

## 2022-08-13 VITALS — BP 118/62 | HR 68 | Temp 97.4°F | Ht 70.0 in | Wt 222.0 lb

## 2022-08-13 DIAGNOSIS — J069 Acute upper respiratory infection, unspecified: Secondary | ICD-10-CM | POA: Diagnosis not present

## 2022-08-13 DIAGNOSIS — S060X0S Concussion without loss of consciousness, sequela: Secondary | ICD-10-CM

## 2022-08-13 LAB — POC COVID19 BINAXNOW: SARS Coronavirus 2 Ag: NEGATIVE

## 2022-08-13 MED ORDER — DOXYCYCLINE HYCLATE 100 MG PO TABS: 100.0000 mg | ORAL_TABLET | Freq: Two times a day (BID) | ORAL | 0 refills | Status: DC

## 2022-08-13 NOTE — Progress Notes (Signed)
Recently with mild ST and cough.  No fevers.  No sputum.  Sx going on for 1-2 days.  Mild cough.    Concussion follow up.  Prev with blurry vision with head turning.  Prev photophobia.  Balance changes prev noted.    Sx improved in the meantime. Some fatigue.  We talked about working limited shifts in the meantime, when he returns to work.    Meds, vitals, and allergies reviewed.   ROS: Per HPI unless specifically indicated in ROS section   GEN: nad, alert and oriented HEENT: mucous membranes moist,  OP wnl, L max sinus minimally ttp. NECK: supple w/o LA CV: rrr.   PULM: ctab, no inc wob EXT: no edema SKIN: well perfused.  Faster finger to nose testing this week. Balance improved on testing.    Covid neg

## 2022-08-13 NOTE — Patient Instructions (Addendum)
Try returning to work for 4-5 hours per shift.  If symptoms are worse, then let me know.  Would need to be out of work at that point.   Take care.  Glad to see you. Recheck labs at or before a physical in/around Feb 2024.  Covid test negative.  Rest and fluids.  If you have prolonged sinus pain, then start doxy.

## 2022-08-16 DIAGNOSIS — J069 Acute upper respiratory infection, unspecified: Secondary | ICD-10-CM | POA: Insufficient documentation

## 2022-08-16 NOTE — Assessment & Plan Note (Signed)
Covid test negative.  Rest and fluids.  If prolonged sinus pain, then start doxy and update me as needed.  He agrees to plan.

## 2022-08-16 NOTE — Assessment & Plan Note (Signed)
Improved.  We talked about gradual return to work. Reasonable to try returning to work for 4-5 hours per shift.  If symptoms are worse, then let me know.  Would need to be out of work at that point.

## 2022-08-17 DIAGNOSIS — M542 Cervicalgia: Secondary | ICD-10-CM | POA: Diagnosis not present

## 2022-08-17 DIAGNOSIS — Z79891 Long term (current) use of opiate analgesic: Secondary | ICD-10-CM | POA: Diagnosis not present

## 2022-08-17 DIAGNOSIS — M47812 Spondylosis without myelopathy or radiculopathy, cervical region: Secondary | ICD-10-CM | POA: Diagnosis not present

## 2022-08-18 ENCOUNTER — Other Ambulatory Visit: Payer: Self-pay | Admitting: Cardiology

## 2022-08-18 ENCOUNTER — Ambulatory Visit: Payer: Medicare HMO | Admitting: Adult Health

## 2022-08-19 DIAGNOSIS — Z0189 Encounter for other specified special examinations: Secondary | ICD-10-CM | POA: Diagnosis not present

## 2022-08-19 DIAGNOSIS — M67911 Unspecified disorder of synovium and tendon, right shoulder: Secondary | ICD-10-CM | POA: Diagnosis not present

## 2022-08-21 ENCOUNTER — Telehealth: Payer: Self-pay

## 2022-08-21 NOTE — Progress Notes (Signed)
Chronic Care Management Pharmacy Assistant   Name: Tony Manning  MRN: [id number] DOB: 09-27-1960  Reason for Encounter: CCM (Medication Adherence and Delivery Coordination)  Recent office visits:     08/13/22 Tony Stain, MD Viral upper respiratory tract infection Start: Doxycyline Hyclate 100 mg 08/04/22 Tony Stain, MD Concussion Change: Gabapentin 300 mg Change: Insulin Aspart 8 units Change: SOLIQUA 100-33 UNT-MCG/ML SOPN FU 3 weeks  Recent consult visits:  08/17/22 Tony Manning [facility]) Neck pain Start: oxyCODONE-Acetaminophen [facility] visits:  None in previous 6 months  Medications: Outpatient Encounter Medications as of 08/21/2022  Medication Sig   amLODipine (NORVASC) 5 MG tablet Take 1 tablet (5 mg total) by mouth every morning.   aspirin 81 MG chewable tablet Chew 81 mg by mouth at bedtime.   atorvastatin (LIPITOR) 40 MG tablet TAKE ONE TABLET BY MOUTH EVERY MORNING   Blood Glucose Monitoring Suppl (ONETOUCH VERIO) w/Device KIT Use to check blood sugars TID. Dx: E11.9   carvedilol (COREG) 6.25 MG tablet TAKE ONE TABLET BY MOUTH TWICE DAILY   Cholecalciferol (VITAMIN D3) 50 MCG (2000 UT) capsule Take 1 capsule (2,000 Units total) by mouth daily.   Continuous Blood Gluc Receiver (FREESTYLE LIBRE 2 READER) DEVI Use with sensors to monitor sugar continuously   Continuous Blood Gluc Sensor (FREESTYLE LIBRE 2 SENSOR) MISC Apply sensor every 14 days to monitor sugar continously   cyclobenzaprine (FLEXERIL) 5 MG tablet Take 1 tablet (5 mg total) by mouth 3 (three) times daily as needed for muscle spasms (sedation caution).   diclofenac Sodium (VOLTAREN) 1 % GEL Apply 2-4 g topically 4 (four) times daily.   doxycycline (VIBRA-TABS) 100 MG tablet Take 1 tablet (100 mg total) by mouth 2 (two) times daily.   famotidine (PEPCID) 20 MG tablet TAKE ONE TABLET BY MOUTH EVERYDAY AT BEDTIME   gabapentin (NEURONTIN) 300 MG capsule 1 tab in the AM, 1  tab middday, and 1-2 in the PM, sedation caution.   glimepiride (AMARYL) 4 MG tablet TAKE ONE TABLET BY MOUTH TWICE DAILY   insulin aspart (FIASP FLEXTOUCH) 100 UNIT/ML FlexTouch Pen Inject 8 Units into the skin daily with supper.   Insulin Glargine-Lixisenatide (SOLIQUA) 100-33 UNT-MCG/ML SOPN Inject 40 Units into the skin daily.   isosorbide mononitrate (IMDUR) 30 MG 24 hr tablet TAKE ONE TABLET BY MOUTH EVERYDAY AT BEDTIME   losartan (COZAAR) 100 MG tablet TAKE ONE TABLET BY MOUTH EVERYDAY AT BEDTIME   metFORMIN (GLUCOPHAGE) 500 MG tablet TAKE ONE TABLET BY MOUTH TWICE DAILY   nitroGLYCERIN (NITROSTAT) 0.4 MG SL tablet Place 1 tablet (0.4 mg total) under the tongue every 5 (five) minutes as needed for chest pain.   ondansetron (ZOFRAN) 4 MG tablet Take 4 mg by mouth every 8 (eight) hours as needed.   oxyCODONE-acetaminophen (PERCOCET/ROXICET) 5-325 MG tablet Take 1 tablet by mouth 4 (four) times daily.   pantoprazole (PROTONIX) 40 MG tablet TAKE ONE TABLET BY MOUTH EVERY MORNING   tamsulosin (FLOMAX) 0.4 MG CAPS capsule TAKE ONE CAPSULE BY MOUTH EVERY MORNING and TAKE ONE CAPSULE BY MOUTH EVERYDAY AT BEDTIME   No facility-administered encounter medications on file as of 08/21/2022.   BP Readings from Last 3 Encounters:  08/13/22 118/62  08/04/22 122/68  07/20/22 (!) 140/70    Pulse Readings from Last 3 Encounters:  08/13/22 68  08/04/22 70  07/20/22 61    Lab Results  Component Value Date/Time   HGBA1C 8.0 (A) 06/12/2022 07:57  AM   HGBA1C 7.2 (H) 12/23/2021 11:47 AM   HGBA1C 7.2 (H) 06/09/2021 09:23 AM   Lab Results  Component Value Date   CREATININE 1.56 (H) 01/09/2022   BUN 19 01/09/2022   GFR 47.53 (L) 01/09/2022   GFRNONAA 54 (L) 11/12/2020   GFRAA 42 (L) 05/05/2019   NA 140 01/09/2022   K 4.9 01/09/2022   CALCIUM 9.6 01/09/2022   CO2 29 01/09/2022    Last adherence delivery date: 08/04/2022      Patient is due for next adherence delivery on: 09/03/2022  Spoke  with patient on 08/21/2022 reviewed medications and coordinated delivery.  This delivery to include: Adherence Packaging  30 Days  Packs: Amlodipine 6m- 1 tablet daily (1 breakfast) Aspirin 861m 1 tablet daily (breakfast) Metformin 50036m tablet twice daily (1 tablet  breakfast, 1 tablet evening meal) Carvedilol 6.50m15m tablet twice daily (1 breakfast, 1 evening meal) Isosorbide 30mg55mtablet daily (1 bedtime) Tamsulosin 0.4mg 237mpsule daily (1 breakfast, 1 bedtime)   Losartan 100 mg- 1 tablet daily- (1 bedtime) Atorvastatin 40mg- 40mblet daily (1 breakfast) Glimepiride 4mg- 1 66mlet twice daily (1 breakfast, 1 evening meal) Pantoprazole 40 mg- 1 tablet daily (1 breakfast)   Vial medications: Gabapentin 300 mg - 1 tablet in morning and 2 tablet @ 8:00 pm (patient time preference so put in vial) Fiasp 100 Units   Auto-refill monthly. Will be sent to patient separately each month: Soliqua 100-33 Unit - Inject 48 units daily in the morning - patient is to keep increasing units until 130 or below.    Patient declined the following medications this month: Any concerns about your medications? No   How often do you forget or accidentally miss a dose? Never   Do you use a pillbox? No  Is patient in packaging Yes  If yes  What is the date on your next pill pack? 08/21/2022  Any concerns or issues with your packaging? No  No refill request needed.  Confirmed delivery date of 09/03/2022, advised patient that pharmacy will contact them the morning of delivery.   Recent blood pressure readings are as follows: Patient does not take blood pressure at home.    Recent blood glucose readings are as follows: Tony Charlene Brookeess to patient's meter readings.    Annual wellness visit in last year? No Most Recent BP reading: 118/62 on 08/13/2022   If Diabetic: Most recent A1C reading: 8.0 on 06/12/2022 Last eye exam / retinopathy screening: 01/13/2021 Last diabetic foot  exam: Up to date  [facility]  Next CCM appointment: 09/08/2022  Tony Charlene Brooketified  Tony Manning Pharmacy Assistant [phone]

## 2022-08-25 ENCOUNTER — Ambulatory Visit: Payer: Medicare HMO | Admitting: Family Medicine

## 2022-08-26 ENCOUNTER — Other Ambulatory Visit: Payer: Self-pay | Admitting: Family Medicine

## 2022-08-26 DIAGNOSIS — Z794 Long term (current) use of insulin: Secondary | ICD-10-CM

## 2022-08-28 DIAGNOSIS — M25511 Pain in right shoulder: Secondary | ICD-10-CM | POA: Diagnosis not present

## 2022-08-29 DIAGNOSIS — Z794 Long term (current) use of insulin: Secondary | ICD-10-CM | POA: Diagnosis not present

## 2022-08-29 DIAGNOSIS — E1149 Type 2 diabetes mellitus with other diabetic neurological complication: Secondary | ICD-10-CM | POA: Diagnosis not present

## 2022-09-06 ENCOUNTER — Other Ambulatory Visit: Payer: Self-pay | Admitting: Family Medicine

## 2022-09-06 DIAGNOSIS — Z794 Long term (current) use of insulin: Secondary | ICD-10-CM

## 2022-09-08 ENCOUNTER — Ambulatory Visit: Payer: Self-pay | Admitting: Pharmacist

## 2022-09-08 ENCOUNTER — Encounter: Payer: Medicare HMO | Admitting: Pharmacist

## 2022-09-08 NOTE — Progress Notes (Signed)
Care Management & Coordination Services Outreach Note  09/08/2022 Name: Tony Manning MRN: [id number] DOB: [birth year]  Referred by: Tonia Ghent, MD  Patient had a phone appointment scheduled with clinical pharmacist today.  An unsuccessful telephone outreach was attempted today. The patient was referred to the pharmacist for assistance with medications, care management and care coordination.   Patient will NOT be penalized in any way for missing a Care Management & Coordination Services appointment. The no-show fee does not apply.  If possible, a message was left to return call to: [phone] or to [facility].  Charlene Brooke, PharmD, BCACP Clinical Pharmacist [place] [facility] at [facility] [phone]

## 2022-09-08 NOTE — Progress Notes (Deleted)
Care Management & Coordination Services Pharmacy Note  09/08/2022 Name:  Tony Manning MRN:  [id number] DOB:  05-23-1961  Summary: F/U visit -DM: A1c 8.0% (10/23); pt has been out of work since 10/30 due to concussion -He reports compliance with Soliqua 38 units daily and Fiasp 5 units with dinner - overnight BG/fasting BG has slightly improved with addition of Fiasp Reviewed AGP report: 07/11/22 to 07/24/22. Sensor active: 95%             Time in range (70-180): 36% (goal > 70%)             High (180-250): 45%             Very high (>250): 19%             Low (< 70): 0% (goal < 4%)             GMI: 8.1%; Average glucose: 201 -Pt notes Tony Manning is not nearly as effective as Ozempic; he cannot afford Ozempic until insurance resets in January   Recommendations:  -Increase Fiasp to 8 units with dinner   Follow up plan: ***   Subjective: Tony Manning is an 62 y.o. year old male who is a primary patient of Para March, Dwana Curd, MD.  The care coordination team was consulted for assistance with disease management and care coordination needs.    Engaged with patient by telephone for follow up visit.  [name], [name], [name], [name]  Recent office visits: 08/13/22 Dr Para March OV: viral URI - covid negative. Rx doxycycline. Try work 4-5 hrs per shift.    08/04/22 Dr Para March OV: Concussion f/u - brain rest. Avoid screens. Don't drive. F/u 3 weeks.   07/20/22 Dr Patsy Lager OV: f/u concussion - still very symptomatic. Remain out of work x next 3 weeks. Refrain from driving. Limit screen use. RTC 3 weeks.   07/06/22 Dr Patsy Lager OV: concussion, fall at work. Hold out of work and limit physical activity - no driving, using computers or smart phones. F/u 2 weeks.   06/29/22 Dr Para March OV: DM - lows in PM. Reduce Soliqua to 40 units.   06/19/22  Dr Para March OV: f/u - waiting on meter replacement. Leg pain - Increase gabapentin to 300 mg AM and 600 mg PM.   06/12/22 Dr Para March OV: f/u - A1c 8.0%; update about sugars in 2-3 weeks  Recent consult visits: 05/06/22 Dr [name] (Cardiology): f/u HF, doing well, no changes.   02/16/22 NP Tammy Parrett (pulmonary): f/u Sarcoid. D/c pregabalin. D/c lasix, Kcl. Ordered PFTs.   01/05/22 PA Elvin So (cardiology): SOB/edema. Rx'd furosemide 20 mg and Kcl 10 mEq. Reduced amlodipine to 5 mg.   10/02/21 Dr Jacquelyne Balint (psychology): chronic pain  - stable for spinal cord stimulator trial  Hospital visits: None in previous 6 months   Objective:  Lab Results  Component Value Date   CREATININE 1.56 (H) 01/09/2022   BUN 19 01/09/2022   GFR 47.53 (L) 01/09/2022   GFRNONAA 54 (L) 11/12/2020   GFRAA 42 (L) 05/05/2019   NA 140 01/09/2022   K 4.9 01/09/2022   CALCIUM 9.6 01/09/2022   CO2 29 01/09/2022   GLUCOSE 156 (H) 01/09/2022    Lab Results  Component Value Date/Time   HGBA1C 8.0 (A) 06/12/2022 07:57 AM   HGBA1C 7.2 (H) 12/23/2021 11:47 AM   HGBA1C  7.2 (H) 06/09/2021 09:23 AM   GFR 47.53 (L) 01/09/2022 12:05 PM   GFR 60.28 12/23/2021 11:47 AM   MICROALBUR 1.5 08/15/2012 09:05 AM    Last diabetic Eye exam:  Lab Results  Component Value Date/Time   HMDIABEYEEXA Retinopathy (A) 05/15/2022 12:00 AM    Last diabetic Foot exam: No results found for: "HMDIABFOOTEX"   Lab Results  Component Value Date   CHOL 112 06/09/2021   HDL 40.20 06/09/2021   LDLCALC 57 06/09/2021   TRIG 73.0 06/09/2021   CHOLHDL 3 06/09/2021       Latest Ref Rng & Units 06/09/2021    9:23 AM 01/27/2021   12:40 PM 03/05/2020    2:46 PM  Hepatic Function  Total Protein 6.0 - 8.3 g/dL 6.8  7.5  7.5   Albumin 3.5 - 5.2 g/dL 4.3  4.8  4.6   AST 0 - 37 U/L $Remo'18  20  20   'EZKBo$ ALT 0 - 53 U/L $Remo'23  21  21   'SDUup$ Alk Phosphatase 39 - 117 U/L 90  78  67   Total Bilirubin 0.2 - 1.2 mg/dL 0.5  0.6  0.5   Bilirubin, Direct 0.0 -  0.3 mg/dL  0.1      Lab Results  Component Value Date/Time   TSH 1.34 01/27/2021 12:40 PM   TSH 2.22 03/21/2018 12:05 PM       Latest Ref Rng & Units 12/23/2021   11:47 AM 06/09/2021    9:23 AM 01/27/2021   12:40 PM  CBC  WBC 4.0 - 10.5 K/uL 5.9  7.2  7.9   Hemoglobin 13.0 - 17.0 g/dL 13.4  13.7  14.0   Hematocrit 39.0 - 52.0 % 39.6  40.9  40.6   Platelets 150.0 - 400.0 K/uL 193.0  198.0  223.0     Lab Results  Component Value Date/Time   VD25OH 23.11 (L) 12/23/2021 11:47 AM   VD25OH 19.37 (L) 03/21/2018 12:05 PM   VITAMINB12 290 12/23/2021 11:47 AM   VITAMINB12 287 06/09/2021 09:23 AM    Clinical ASCVD: Yes  The ASCVD Risk score (Arnett DK, et al., 2019) failed to calculate for the following reasons:   The patient has a prior MI or stroke diagnosis       12/23/2021   11:03 AM 09/20/2020    2:06 PM 04/21/2018    9:57 AM  Depression screen PHQ 2/9  Decreased Interest 0 0 0  Down, Depressed, Hopeless 0 0 0  PHQ - 2 Score 0 0 0  Altered sleeping  0   Tired, decreased energy  0   Change in appetite  0   Feeling bad or failure about yourself   0   Trouble concentrating  0   Moving slowly or fidgety/restless  0   Suicidal thoughts  0   PHQ-9 Score  0   Difficult doing work/chores  Not difficult at all     Social History   Tobacco Use  Smoking Status Never  Smokeless Tobacco Former   Types: Chew   Quit date: 02/06/2016   BP Readings from Last 3 Encounters:  08/13/22 118/62  08/04/22 122/68  07/20/22 (!) 140/70   Pulse Readings from Last 3 Encounters:  08/13/22 68  08/04/22 70  07/20/22 61   Wt Readings from Last 3 Encounters:  08/13/22 222 lb (100.7 kg)  08/04/22 217 lb (98.4 kg)  07/20/22 215 lb 6 oz (97.7 kg)   BMI Readings from Last 3 Encounters:  08/13/22  31.85 kg/m  08/04/22 31.14 kg/m  07/20/22 30.90 kg/m    Allergies  Allergen Reactions   Ambien [Zolpidem] Other (See Comments)    Parasomnias, sleep walking   Lisinopril Cough   Tramadol  Diarrhea    Medications Reviewed Today     Reviewed by Filbert Berthold, CMA (Certified Medical Assistant) on 08/13/22 at [place] List Status: <None>   Medication Order Taking? Sig Documenting Provider Last Dose Status Informant  amLODipine (NORVASC) 5 MG tablet [id number] Yes Take 1 tablet (5 mg total) by mouth every morning. Cantwell, Celeste C, PA-C Taking Active   aspirin 81 MG chewable tablet [id number] Yes Chew 81 mg by mouth at bedtime. [provider] Taking Active Self  atorvastatin (LIPITOR) 40 MG tablet [id number] Yes TAKE ONE TABLET BY MOUTH EVERY MORNING Tonia Ghent, MD Taking Active   Blood Glucose Monitoring Suppl [facility] VERIO) w/Device KIT [id number] Yes Use to check blood sugars TID. Dx: E11.9 Tonia Ghent, MD Taking Active   carvedilol (COREG) 6.25 MG tablet [id number] Yes TAKE ONE TABLET BY MOUTH TWICE DAILY Adrian Prows, MD Taking Active   Cholecalciferol (VITAMIN D3) 50 MCG (2000 UT) capsule [id number] Yes Take 1 capsule (2,000 Units total) by mouth daily. Tonia Ghent, MD Taking Active   Continuous Blood Gluc Receiver (FREESTYLE LIBRE 2 READER) [place] [id number] Yes Use with sensors to monitor sugar continuously Tonia Ghent, MD Taking Active   Continuous Blood Gluc Sensor (FREESTYLE LIBRE 2 SENSOR) [place] [id number] Yes Apply sensor every 14 days to monitor sugar continously Tonia Ghent, MD Taking Active   cyclobenzaprine (FLEXERIL) 5 MG tablet [id number] Yes Take 1 tablet (5 mg total) by mouth 3 (three) times daily as needed for muscle spasms (sedation caution). Tonia Ghent, MD Taking Active   diclofenac Sodium (VOLTAREN) 1 % GEL [id number] Yes Apply 2-4 g topically 4 (four) times daily. Tonia Ghent, MD Taking Active   famotidine (PEPCID) 20 MG tablet [id number] Yes TAKE ONE TABLET BY MOUTH EVERYDAY AT BEDTIME Parrett, Tammy S, NP Taking Active   gabapentin (NEURONTIN) 300 MG capsule [id number] Yes 1 tab in the AM, 1 tab middday, and 1-2 in the  PM, sedation caution. Tonia Ghent, MD Taking Active   glimepiride (AMARYL) 4 MG tablet [id number] Yes TAKE ONE TABLET BY MOUTH TWICE DAILY Tonia Ghent, MD Taking Active   insulin aspart (FIASP FLEXTOUCH) 100 UNIT/ML FlexTouch Pen [id number] Yes Inject 8 Units into the skin daily with supper. Tonia Ghent, MD Taking Active   Insulin Glargine-Lixisenatide [facility]) 100-33 UNT-MCG/ML SOPN [id number] Yes Inject 40 Units into the skin daily. Tonia Ghent, MD Taking Active   isosorbide mononitrate (IMDUR) 30 MG 24 hr tablet [id number] Yes TAKE ONE TABLET BY MOUTH EVERYDAY AT BEDTIME Adrian Prows, MD Taking Active   losartan (COZAAR) 100 MG tablet [id number] Yes TAKE ONE TABLET BY MOUTH EVERYDAY AT BEDTIME Adrian Prows, MD Taking Active   metFORMIN (GLUCOPHAGE) 500 MG tablet [id number] Yes TAKE ONE TABLET BY MOUTH TWICE DAILY Tonia Ghent, MD Taking Active   nitroGLYCERIN (NITROSTAT) 0.4 MG SL tablet [id number] Yes Place 1 tablet (0.4 mg total) under the tongue every 5 (five) minutes as needed for chest pain. Miquel Dunn, NP Taking Active   ondansetron [facility]) 4 MG tablet [id number] Yes Take 4 mg by mouth every 8 (eight) hours as needed. [provider] Taking Active   oxyCODONE-acetaminophen (PERCOCET/ROXICET) 5-325 MG tablet [id number] Yes Take 1 tablet by mouth  4 (four) times daily. [provider] Taking Active   pantoprazole (PROTONIX) 40 MG tablet [id number] Yes TAKE ONE TABLET BY MOUTH EVERY MORNING Tonia Ghent, MD Taking Active   tamsulosin (FLOMAX) 0.4 MG CAPS capsule [id number] Yes TAKE ONE CAPSULE BY MOUTH EVERY MORNING and TAKE ONE CAPSULE BY MOUTH EVERYDAY AT BEDTIME Tonia Ghent, MD Taking Active             Patient Active Problem List   Diagnosis Date Noted   URI (upper respiratory infection) 08/16/2022   Concussion 08/05/2022   Acute cough 02/05/2022   Near syncope 12/25/2021   COVID-19 11/14/2021   Advance care planning 06/13/2021    Insomnia 06/13/2021   B12 deficiency 01/30/2021   Fatigue 01/29/2021   Abdominal discomfort 01/05/2021   SOB (shortness of breath) 11/12/2020   Sarcoidosis 11/12/2020   H/O colonoscopy 10/27/2020   Nocturia 05/03/2020   Right sided weakness 03/06/2020   Low back pain 01/29/2020   Facial weakness 11/16/2019   Hematuria 03/22/2018   Renal stones 03/22/2018   Vitamin D deficiency, unspecified 10/04/2017   Chronic neck pain (Primary Area of Pain) (B) (L>R) 09/21/2017   Chronic upper extremity pain (Secondary Area of Pain) (Bilateral) (L>R) 09/21/2017   Chronic left shoulder pain (Tertiary Area of Pain) 09/21/2017   Chronic low back pain (Fourth Area of Pain) (B) (R>L) 09/21/2017   Right leg pain 09/21/2017   Other long term (current) drug therapy 09/21/2017   Other specified health status 09/21/2017   Other chronic pain 08/22/2017   Other fatigue 05/10/2017   Radiculopathy 04/07/2017   Angina pectoris ([place]) 10/15/2016   Cervical radiculitis 07/23/2016   Dysphagia 06/12/2016   Sciatica 02/06/2016   Plantar fasciitis 01/16/2016   Right sided sciatica 05/16/2015   Hand pain 04/05/2015   Premature ejaculation 09/17/2014   Heel pain 09/17/2014   NSTEMI (non-ST elevated myocardial infarction) ([place]) 06/13/2014   Atypical chest pain 06/06/2014   Memory change 05/24/2014   Benign paroxysmal positional vertigo 07/13/2013   Orthostasis 07/13/2013   Migraine, unspecified, without mention of intractable migraine without mention of status migrainosus 04/03/2013   Pain in joint, ankle and foot 10/19/2012   Irritable mood 01/18/2012   Neck pain 12/03/2010   Hearing loss 12/03/2010   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   HYPERCHOLESTEROLEMIA 10/20/2010   DEPRESSION 10/20/2010   Osteoarthritis 10/20/2010   ARTHRITIS 10/20/2010   NEPHROLITHIASIS, HX OF 10/20/2010   ESSENTIAL HYPERTENSION, BENIGN 09/30/2010   CORONARY ATHEROSCLEROSIS NATIVE CORONARY ARTERY  09/30/2010   Sleep apnea 09/30/2010   CHEST PAIN UNSPECIFIED 09/30/2010   PERCUTANEOUS TRANSLUMINAL CORONARY ANGIOPLASTY, HX OF 09/30/2010    Immunization History  Administered Date(s) Administered   Fluad Quad(high Dose 65+) 06/12/2022   Influenza Inj Mdck Quad Pf 06/02/2018   Influenza Whole 09/17/2010   Influenza,inj,Quad PF,6+ Mos 06/08/2013, 05/22/2014, 05/12/2016, 05/08/2019, 05/27/2020   Influenza-Unspecified 06/08/2013, 05/22/2014, 06/08/2015, 05/12/2016, 05/21/2017, 06/02/2018, 05/09/2019, 05/21/2021   PFIZER(Purple Top)SARS-COV-2 Vaccination 11/27/2019, 12/25/2019, 08/07/2020, 05/21/2021   Pneumococcal Polysaccharide-23 09/08/2007, 02/16/2013   Td 09/07/2009   Td (Adult), 2 Lf Tetanus Toxid, Preservative Free 09/07/2009   Zoster Recombinat (Shingrix) 05/29/2022     Compliance/Adherence/Medication fill history: Care Gaps: UACR Colonoscopy (due 03/2018) AWV (due 06/09/22)   Star-Rating Drugs: Atorvastatin - PDC 100% Glimepiride - PDC 100% Metformin - PDC 100% Losartan - PDC 100%  SDOH:  (Social Determinants of Health) assessments and interventions performed: {yes/no:20286} SDOH Interventions    Flowsheet Row Chronic Care Management from 11/21/2021  in [place] at [place] Management from 12/20/2020 in [place] at [place] Management from 11/27/2020 in [place] at [place] from 09/20/2020 in [place] at [place] Interventions      Food Insecurity Interventions Intervention Not Indicated -- -- --  Depression Interventions/Treatment  -- -- -- YJE5-6 Score <4 Follow-up Not Indicated  Financial Strain Interventions Other (Comment)  [Renew Novo Cares PAP] Other (Comment)  [Ozempic patient assistance approved] --  [Application for med assistance in process] --      SDOH Screenings   Food Insecurity: No Food Insecurity (11/21/2021)  Housing: Low Risk  (09/20/2020)   Transportation Needs: No Transportation Needs (09/20/2020)  Alcohol Screen: Low Risk  (09/20/2020)  Depression (PHQ2-9): Low Risk  (12/23/2021)  Financial Resource Strain: Medium Risk (11/21/2021)  Physical Activity: Inactive (09/20/2020)  Stress: No Stress Concern Present (09/20/2020)  Tobacco Use: Medium Risk (08/13/2022)    Medication Assistance: {MEDASSISTANCEINFO:25044}  Medication Access: Within the past 30 days, how often has patient missed a dose of medication? *** Is a pillbox or other method used to improve adherence? {YES/NO:21197} Factors that may affect medication adherence? {CHL DESC; BARRIERS:21522} Are meds synced by current pharmacy? {YES/NO:21197} Are meds delivered by current pharmacy? {YES/NO:21197} Does patient experience delays in picking up medications due to transportation concerns? {YES/NO:21197}  [facility] Reviewed: Is patient disadvantaged to use [facility]?: {YES/NO:21197} Current Rx insurance plan: *** Name and location of Current pharmacy:  [facility] - [place], [place] - [address] [place] [address] Phone: [phone] Fax: [phone]  [facility] services reviewed with patient today?: {YES/NO:21197} Patient requests to transfer care to [facility]?: {YES/NO:21197} Reason patient declined to change pharmacies: {US patient preference:27474}   Assessment/Plan   Hypertension / CKD Stage 3b (BP goal <130/80) -Controlled - BP at goal in office  -Current treatment: Amodipine 5 mg daily - Appropriate, Effective, Safe, Accessible Carvedilol 6.25 mg BID -Appropriate, Effective, Safe, Accessible Isosorbide [place] 30 mg HS -Appropriate, Effective, Safe, Accessible Losartan 100 mg daily -Appropriate, Effective, Safe, Accessible -Medications previously tried: n/a  -Educated on BP goals and benefits of medications for prevention of heart attack, stroke and kidney  damage; -Counseled to monitor BP at home periodically, document, and provide log at future appointments -Recommended to continue current medication   Hyperlipidemia / CAD (LDL goal < 70) -Controlled - LDL 59 -Hx CAD. NSTEMI 06/2014 -Current treatment: Atorvastatin 40 mg daily -Appropriate, Effective, Safe, Accessible Aspirin 81 mg daily -Appropriate, Effective, Safe, Accessible Isosorbide MN 30 mg daily -Appropriate, Effective, Safe, Accessible Nitroglycerin 0.4 mg SL prn -Appropriate, Effective, Safe, Accessible -Medications previously tried: none  -Recommended to continue current medication   Diabetes (A1c goal <7%) -Not ideally controlled - A1c 8.0% (06/2022);  -pt took last dose of Ozempic 7/30, he cannot afford to refill Ozempic in [place] so was switched to [place] ($35 copay) 8/18. Fiasp added 11/2 to target overnight highs -Home BG readings:  Reviewed AGP report: 08/26/22 to 09/08/22. Sensor active: 88%             Time in range (70-180): 29% (goal > 70%)             High (180-250): 46%             Very high (>250): 25%             Low (< 70): 0% (goal < 4%)  GMI: 8.4%; Average glucose: 211'  Previous AGP report: 07/11/22 to 07/24/22. Sensor active: 95%             Time in range (70-180): 36% (goal > 70%)             High (180-250): 45%             Very high (>250): 19%             Low (< 70): 0% (goal < 4%)             GMI: 8.1%; Average glucose: 201    -Current medications: Glimepiride 4 mg BID - Appropriate, Query Effective, Metformin 500 mg BID -Appropriate, Query Effective, Soliqua 38 units daily AM - Appropriate, Query Effective, Fiasp 5 units with dinner -Appropriate, Query Effective, Freestyle Libre 2 ([place]) -Medications previously tried: Novolin N 20 u BID, Ozempic (cost in [place]), Tresiba -Reviewed AGP report, sugar control is worse the past 2 weeks likely due to cortisone injection on 9/5 and persistent neck pain -Reviewed medication  options - alternate GLP-1 RA is not affordable/feasible right now. Can consider SGLT2-inhibitor, however these will also be expensive in [place]ut [place]. Affordable options include Actos, Prandin, or bolus insulin.  -Also discussed diet changes - currently he eats one large meal/snacks in the evening/nighttime which explains why fasting sugars are elevated, and he will occasionally drop into 70s in mid-afternoon because he does not usually eat much in AM/lunch. Advised pt to try to eat more regularly throughout the day to limit overnight highs and midday lows. -Per AGP report, addition of Fiasp has slightly improved overnight BG/fasting BG -Recommend to increase Fiasp to 8 units with dinner   Chronic Kidney Disease Stage 3b  -All medications assessed for renal dosing and appropriateness in chronic kidney disease. -Recommended to continue current medication   Chronic pain (Goal: manage symptoms) -Controlled -cervical radiculitis, sciatica, osteoarthritis. Injections from other did not help, going to pain mgmt. -Current treatment  Oxycodone-APAP 5-325 mg - Appropriate, Effective, Safe, Accessible Cyclobenzaprine 5 mg TID prn -Appropriate, Effective, Safe, Accessible Gabapentin 300 mg -Appropriate, Effective, Safe, Accessible -Medications previously tried: n/a  -Recommended to continue current medication   Health Maintenance -Vaccine gaps: Shingrix       Charlene Brooke, PharmD, BCACP Clinical Pharmacist [facility] at [facility] [phone]

## 2022-09-11 ENCOUNTER — Ambulatory Visit (INDEPENDENT_AMBULATORY_CARE_PROVIDER_SITE_OTHER): Payer: Medicare HMO | Admitting: Family Medicine

## 2022-09-11 ENCOUNTER — Encounter: Payer: Self-pay | Admitting: Family Medicine

## 2022-09-11 VITALS — BP 104/60 | HR 67 | Temp 97.8°F | Ht 70.0 in | Wt 215.0 lb

## 2022-09-11 DIAGNOSIS — E1149 Type 2 diabetes mellitus with other diabetic neurological complication: Secondary | ICD-10-CM | POA: Diagnosis not present

## 2022-09-11 DIAGNOSIS — E559 Vitamin D deficiency, unspecified: Secondary | ICD-10-CM | POA: Diagnosis not present

## 2022-09-11 DIAGNOSIS — Z794 Long term (current) use of insulin: Secondary | ICD-10-CM | POA: Diagnosis not present

## 2022-09-11 DIAGNOSIS — J069 Acute upper respiratory infection, unspecified: Secondary | ICD-10-CM | POA: Diagnosis not present

## 2022-09-11 DIAGNOSIS — S060X0S Concussion without loss of consciousness, sequela: Secondary | ICD-10-CM | POA: Diagnosis not present

## 2022-09-11 DIAGNOSIS — E538 Deficiency of other specified B group vitamins: Secondary | ICD-10-CM

## 2022-09-11 MED ORDER — HYDROCODONE BIT-HOMATROP MBR 5-1.5 MG/5ML PO SOLN: 5.0000 mL | Freq: Three times a day (TID) | ORAL | 0 refills | Status: DC | PRN

## 2022-09-11 MED ORDER — GABAPENTIN 300 MG PO CAPS: ORAL_CAPSULE | ORAL | Status: DC

## 2022-09-11 NOTE — Patient Instructions (Addendum)
Let me check with pharmacy about ozempic in the meantime.  Use hydrocodone for the cough if needed but not with oxycodone.  Rest and fluids.   Go to the lab on the way out.   If you have mychart we'll likely use that to update you.    Take care.  Glad to see you.
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
## 2022-09-12 ENCOUNTER — Telehealth: Payer: Self-pay | Admitting: Family Medicine

## 2022-09-12 LAB — VITAMIN B12: Vitamin B-12: 1316 pg/mL — ABNORMAL HIGH (ref 200–1100)

## 2022-09-12 LAB — COMPREHENSIVE METABOLIC PANEL
AG Ratio: 1.7 (calc) (ref 1.0–2.5)
ALT: 22 U/L (ref 9–46)
AST: 22 U/L (ref 10–35)
Albumin: 4.4 g/dL (ref 3.6–5.1)
Alkaline phosphatase (APISO): 81 U/L (ref 35–144)
BUN/Creatinine Ratio: 12 (calc) (ref 6–22)
BUN: 21 mg/dL (ref 7–25)
CO2: 23 mmol/L (ref 20–32)
Calcium: 9.7 mg/dL (ref 8.6–10.3)
Chloride: 107 mmol/L (ref 98–110)
Creat: 1.78 mg/dL — ABNORMAL HIGH (ref 0.70–1.35)
Globulin: 2.6 g/dL (calc) (ref 1.9–3.7)
Glucose, Bld: 114 mg/dL — ABNORMAL HIGH (ref 65–99)
Potassium: 4.5 mmol/L (ref 3.5–5.3)
Sodium: 142 mmol/L (ref 135–146)
Total Bilirubin: 0.5 mg/dL (ref 0.2–1.2)
Total Protein: 7 g/dL (ref 6.1–8.1)

## 2022-09-12 LAB — MICROALBUMIN / CREATININE URINE RATIO
Creatinine, Urine: 172 mg/dL (ref 20–320)
Microalb Creat Ratio: 8 mcg/mg creat (ref <30)
Microalb, Ur: 1.4 mg/dL

## 2022-09-12 LAB — LIPID PANEL
Cholesterol: 103 mg/dL (ref <200)
HDL: 38 mg/dL — ABNORMAL LOW (ref >=40)
LDL Cholesterol (Calc): 44 mg/dL (calc)
Non-HDL Cholesterol (Calc): 65 mg/dL (calc) (ref <130)
Total CHOL/HDL Ratio: 2.7 (calc) (ref <5.0)
Triglycerides: 127 mg/dL (ref <150)

## 2022-09-12 LAB — CBC WITH DIFFERENTIAL/PLATELET
Absolute Monocytes: 447 cells/uL (ref 200–950)
Basophils Absolute: 17 cells/uL (ref 0–200)
Basophils Relative: 0.3 %
Eosinophils Absolute: 296 cells/uL (ref 15–500)
Eosinophils Relative: 5.1 %
HCT: 38.6 % (ref 38.5–50.0)
Hemoglobin: 13.6 g/dL (ref 13.2–17.1)
Lymphs Abs: 1740 cells/uL (ref 850–3900)
MCH: 31.1 pg (ref 27.0–33.0)
MCHC: 35.2 g/dL (ref 32.0–36.0)
MCV: 88.1 fL (ref 80.0–100.0)
MPV: 12.7 fL — ABNORMAL HIGH (ref 7.5–12.5)
Monocytes Relative: 7.7 %
Neutro Abs: 3300 cells/uL (ref 1500–7800)
Neutrophils Relative %: 56.9 %
Platelets: 214 10*3/uL (ref 140–400)
RBC: 4.38 10*6/uL (ref 4.20–5.80)
RDW: 12.8 % (ref 11.0–15.0)
Total Lymphocyte: 30 %
WBC: 5.8 10*3/uL (ref 3.8–10.8)

## 2022-09-12 LAB — HEMOGLOBIN A1C
Hgb A1c MFr Bld: 8.3 % of total Hgb — ABNORMAL HIGH (ref <5.7)
Mean Plasma Glucose: 192 mg/dL
eAG (mmol/L): 10.6 mmol/L

## 2022-09-12 LAB — VITAMIN D 25 HYDROXY (VIT D DEFICIENCY, FRACTURES): Vit D, 25-Hydroxy: 29 ng/mL — ABNORMAL LOW (ref 30–100)

## 2022-09-12 NOTE — Assessment & Plan Note (Signed)
Improved from prior.  Cautions discussed regarding photosensitive with driving at night.  He will update me as needed.

## 2022-09-12 NOTE — Assessment & Plan Note (Signed)
Okay to use hydrocodone cough syrup as needed for cough, but not with oxycodone.  Supportive care otherwise.  He is getting better in the meantime.  He will update me as needed.

## 2022-09-12 NOTE — Assessment & Plan Note (Signed)
On metformin, soliqua.  Ran out of aspart insulin.  No change at this point but would plan for change back to Ozempic.   He had more effective sugar control with ozempic.  He needs to restart that, d/w pt. I told him I would check with pharmacy about options.   See following phone note.

## 2022-09-12 NOTE — Telephone Encounter (Addendum)
I need your help with this patient.  He had more effective sugar control with ozempic.  Can you help with restarting that?  Many thanks.

## 2022-09-14 ENCOUNTER — Other Ambulatory Visit: Payer: Self-pay | Admitting: Family Medicine

## 2022-09-15 ENCOUNTER — Telehealth: Payer: Self-pay | Admitting: Family Medicine

## 2022-09-15 NOTE — Telephone Encounter (Signed)
Advised patient of Dr. Josefine Class recommendations per result note.

## 2022-09-15 NOTE — Telephone Encounter (Signed)
Patient returned call to [facility] like a cb

## 2022-09-16 DIAGNOSIS — G4733 Obstructive sleep apnea (adult) (pediatric): Secondary | ICD-10-CM | POA: Diagnosis not present

## 2022-09-16 MED ORDER — OZEMPIC (0.25 OR 0.5 MG/DOSE) 2 MG/3ML ~~LOC~~ SOPN: 0.5000 mg | PEN_INJECTOR | SUBCUTANEOUS | 5 refills | Status: DC

## 2022-09-16 MED ORDER — TRESIBA FLEXTOUCH 100 UNIT/ML ~~LOC~~ SOPN: 20.0000 [IU] | PEN_INJECTOR | Freq: Every day | SUBCUTANEOUS | 1 refills | Status: DC

## 2022-09-16 MED ORDER — GUAIFENESIN-CODEINE 100-10 MG/5ML PO SOLN: 5.0000 mL | Freq: Four times a day (QID) | ORAL | 0 refills | Status: DC | PRN

## 2022-09-16 NOTE — Telephone Encounter (Signed)
I [facility] the gu[facility]ifenesin [facility].  I would use that first.  Please see other phone note regarding diabetes plan.  Thanks.

## 2022-09-16 NOTE — Telephone Encounter (Signed)
Patient has been notified

## 2022-09-16 NOTE — Addendum Note (Signed)
Addended by: Tonia Ghent on: 09/16/2022 03:40 PM   Modules accepted: Orders

## 2022-09-16 NOTE — Telephone Encounter (Signed)
Agree with plan. Rxs sent and med list updated.  Please call pt.    Plan: -Recommend to stop Stefano Gaul. -Start Ozempic 0.5 mg weekly (no need to start at 0.25 mg since he is currently on another GLP-1 RA as part of [place]) -Start Tresiba 20 units daily. Titrate up 1 unit daily until fasting BG <150 -F/U w/ PharmD 1-2 weeks for CGM update. Titrate [place] as needed.

## 2022-09-16 NOTE — Addendum Note (Signed)
Addended by: Tonia Ghent on: 09/16/2022 03:44 PM   Modules accepted: Orders

## 2022-09-16 NOTE — Telephone Encounter (Signed)
Pt called in requesting a call back  regarding prescription for cough medication [phone]

## 2022-09-16 NOTE — Telephone Encounter (Signed)
Spoke with patient about medication changes and patient agreed with plan and will await his [facility]. Advised to f/u with pharmD in 1-2 weeks with updates.

## 2022-09-16 NOTE — Telephone Encounter (Signed)
The hydrocodone syrup is on backorder still and received fax from [facility] that they have codeine-guaifenesin and promethazine DM available as alt. Patient would like [facility] if possible.

## 2022-09-16 NOTE — Telephone Encounter (Addendum)
Attempted to reach patient to discuss, unable to reach, left VM.   -Current medications: Glimepiride 4 mg BID Metformin 500 mg BID  Soliqua 38 units daily AM  Fiasp 5 units with dinner Freestyle Libre 2 ([place])  Reviewed AGP report: 09/03/22 to 09/16/22. Sensor active: 85%  Time in range (70-180): 42% (goal > 70%)  High (180-250): 36%  Very high (>250): 20%  Low (< 70): 2% (goal < 4%)  GMI: 8.0%; Average glucose: 194   Lab Results  Component Value Date/Time   HGBA1C 8.3 (H) 09/11/2022 04:02 PM   HGBA1C 8.0 (A) 06/12/2022 07:57 AM   HGBA1C 7.2 (H) 12/23/2021 11:47 AM    Plan: -Recommend to stop Stefano Gaul. -Start Ozempic 0.5 mg weekly (no need to start at 0.25 mg since he is currently on another GLP-1 RA as part of [place]) -Start Tresiba 20 units daily. Titrate up 1 unit daily until fasting BG <150 -F/U w/ PharmD 1-2 weeks for CGM update. Titrate [place] as needed.

## 2022-09-22 ENCOUNTER — Ambulatory Visit (INDEPENDENT_AMBULATORY_CARE_PROVIDER_SITE_OTHER): Payer: Medicare HMO

## 2022-09-22 ENCOUNTER — Telehealth: Payer: Self-pay

## 2022-09-22 VITALS — Wt 215.0 lb

## 2022-09-22 DIAGNOSIS — Z1211 Encounter for screening for malignant neoplasm of colon: Secondary | ICD-10-CM | POA: Diagnosis not present

## 2022-09-22 DIAGNOSIS — Z0000 Encounter for general adult medical examination without abnormal findings: Secondary | ICD-10-CM

## 2022-09-22 NOTE — Progress Notes (Signed)
Care Management & Coordination Services Pharmacy Team  Reason for Encounter: Medication coordination and delivery  Contacted patient on 09/22/2022 to discuss medications   Recent office visits:  09/15/2022 Telephone Start: guaiFENesin-codeine 100-10 MG/5ML syrup  09/12/2022 Telephone Start: insulin degludec (TRESIBA FLEXTOUCH) 100 UNIT/ML FlexTouch Pen Start: Semaglutide,0.25 or 0.'5MG'$ /DOS, (OZEMPIC, 0.25 OR 0.5 MG/DOSE,) 2 MG/3ML SOPN Stop: insulin aspart (FIASP FLEXTOUCH) 100 UNIT/ML FlexTouch Pen Stop: SOLIQUA 100-33 UNT-MCG/ML SOPN  09/11/2022 [name], MD DM Abnormal Labs "His kidney function has been variable over the past year.  It is not normal but also not far from his previous baseline.  His liver tests are normal.  Blood counts are fine. Lipids are controlled. A1c is higher than goal at 8.3. B12 is not low. Microalbumin is normal. Vitamin D is minimally low. Verify that he is taking vitamin D 2000 units a day.  If so, then I would increase to 4000 units a day. Please verify his current vitamin B12 intake. I am asking for pharmacy input to help with changing back to Ozempic which should help with his sugar control.  We can recheck A1c at an office visit 3 months after he changes back to Ozempic." Start: HYDROcodone bit-homatropine (HYCODAN) 5-1.5 MG/5ML syrup Change: gabapentin (NEURONTIN) 300 MG capsule Stop (completed): doxycycline (VIBRA-TABS) 100 MG tablet    Recent consult visits:  None since last CCM contact  Hospital visits:  None since last CCM contact  Medications: Outpatient Encounter Medications as of 09/22/2022  Medication Sig   amLODipine (NORVASC) 5 MG tablet Take 1 tablet (5 mg total) by mouth every morning.   aspirin 81 MG chewable tablet Chew 81 mg by mouth at bedtime.   atorvastatin (LIPITOR) 40 MG tablet TAKE ONE TABLET BY MOUTH EVERY MORNING   Blood Glucose Monitoring Suppl (ONETOUCH VERIO) w/Device KIT Use to check blood sugars TID. Dx: E11.9   carvedilol  (COREG) 6.25 MG tablet TAKE ONE TABLET BY MOUTH TWICE DAILY   Cholecalciferol (VITAMIN D3) 50 MCG (2000 UT) capsule Take 1 capsule (2,000 Units total) by mouth daily.   Continuous Blood Gluc Receiver (FREESTYLE LIBRE 2 READER) DEVI Use with sensors to monitor sugar continuously   Continuous Blood Gluc Sensor (FREESTYLE LIBRE 2 SENSOR) MISC Apply sensor every 14 days to monitor sugar continously   cyclobenzaprine (FLEXERIL) 5 MG tablet Take 1 tablet (5 mg total) by mouth 3 (three) times daily as needed for muscle spasms (sedation caution).   diclofenac Sodium (VOLTAREN) 1 % GEL Apply 2-4 g topically 4 (four) times daily.   famotidine (PEPCID) 20 MG tablet TAKE ONE TABLET BY MOUTH EVERYDAY AT BEDTIME   gabapentin (NEURONTIN) 300 MG capsule 1-2 in the PM, sedation caution.   glimepiride (AMARYL) 4 MG tablet TAKE ONE TABLET BY MOUTH TWICE DAILY   guaiFENesin-codeine 100-10 MG/5ML syrup Take 5 mLs by mouth every 6 (six) hours as needed for cough.   insulin degludec (TRESIBA FLEXTOUCH) 100 UNIT/ML FlexTouch Pen Inject 20 Units into the skin daily. Then increase 1 unit daily until fasting BG <150   isosorbide mononitrate (IMDUR) 30 MG 24 hr tablet TAKE ONE TABLET BY MOUTH EVERYDAY AT BEDTIME   losartan (COZAAR) 100 MG tablet TAKE ONE TABLET BY MOUTH EVERYDAY AT BEDTIME   metFORMIN (GLUCOPHAGE) 500 MG tablet TAKE ONE TABLET BY MOUTH TWICE DAILY   nitroGLYCERIN (NITROSTAT) 0.4 MG SL tablet Place 1 tablet (0.4 mg total) under the tongue every 5 (five) minutes as needed for chest pain.   ondansetron (ZOFRAN) 4 MG  tablet Take 4 mg by mouth every 8 (eight) hours as needed.   oxyCODONE-acetaminophen (PERCOCET/ROXICET) 5-325 MG tablet Take 1 tablet by mouth 4 (four) times daily.   pantoprazole (PROTONIX) 40 MG tablet TAKE ONE TABLET BY MOUTH EVERY MORNING   Semaglutide,0.25 or 0.'5MG'$ /DOS, (OZEMPIC, 0.25 OR 0.5 MG/DOSE,) 2 MG/3ML SOPN Inject 0.5 mg into the skin once a week.   tamsulosin (FLOMAX) 0.4 MG CAPS  capsule TAKE ONE CAPSULE BY MOUTH EVERY MORNING and TAKE ONE CAPSULE BY MOUTH EVERYDAY AT BEDTIME   No facility-administered encounter medications on file as of 09/22/2022.   BP Readings from Last 3 Encounters:  09/11/22 104/60  08/13/22 118/62  08/04/22 122/68    Pulse Readings from Last 3 Encounters:  09/11/22 67  08/13/22 68  08/04/22 70    Lab Results  Component Value Date/Time   HGBA1C 8.3 (H) 09/11/2022 04:02 PM   HGBA1C 8.0 (A) 06/12/2022 07:57 AM   HGBA1C 7.2 (H) 12/23/2021 11:47 AM   Lab Results  Component Value Date   CREATININE 1.78 (H) 09/11/2022   BUN 21 09/11/2022   GFR 47.53 (L) 01/09/2022   GFRNONAA 54 (L) 11/12/2020   GFRAA 42 (L) 05/05/2019   NA 142 09/11/2022   K 4.5 09/11/2022   CALCIUM 9.7 09/11/2022   CO2 23 09/11/2022     Patient is due for next adherence delivery on: 10/02/2022  Spoke with patient on 09/22/2022 reviewed medications and coordinated delivery.   This delivery to include: Adherence Packaging  30 Days  Packs: Amlodipine '10mg'$ - 1 tablet daily (1 breakfast) Aspirin '81mg'$ - 1 tablet daily (breakfast) Metformin '500mg'$  1 tablet twice daily (1 tablet  breakfast, 1 tablet evening meal) Carvedilol 6.'25mg'$ - 1 tablet twice daily (1 breakfast, 1 evening meal) Isosorbide '30mg'$ - 1 tablet daily (1 bedtime) Tamsulosin 0.'4mg'$  2 capsule daily (1 breakfast, 1 bedtime)   Losartan 100 mg- 1 tablet daily- (1 bedtime) Atorvastatin '40mg'$ - 1 tablet daily (1 breakfast) Glimepiride '4mg'$ - 1 tablet twice daily (1 breakfast, 1 evening meal) Pantoprazole 40 mg- 1 tablet daily (1 breakfast)   Vial medications: Gabapentin 300 mg - 1 - 2 in the PM, sedation caution  Start on 09/12/2022:  TRESIBA FLEXTOUCH 100 UNIT/ML FlexTouch Pen - 20 units daily. Titrate up 1 unit daily until fasting BG <150   Restart on 09/12/2022: OZEMPIC 0.5 MG/DOSE (no need to start at 0.25 mg since he is currently on another GLP-1 RA as part of [place])   Change on 09/11/2022: gabapentin  (NEURONTIN) 300 MG capsule 1-2 in the PM, sedation caution   Stop on 09/12/2022: Soliqua 100-33 Unit - Inject 48 units daily in the morning - patient is to keep increasing units until 130 or below.  Fiasp 100 Units   Patient declined the following medications this month: None   How often do you forget or accidentally miss a dose? Never   Do you use a pillbox? No  No refill request needed.  Is patient in packaging Yes  If yes  What is the date on your next pill pack? 09/22/2022  Any concerns or issues with your packaging? No  Recent blood pressure readings are as follows: Patient does not take blood pressure at home.    Recent blood glucose readings are as follows: Charlene Brooke has access to patient's meter readings.   Cycle dispensing form sent to Charlene Brooke, PharmD for review.   Marijean Niemann, CMA

## 2022-09-22 NOTE — Progress Notes (Addendum)
I connected with  JAMARKUS LISBON on 09/22/22 by a audio enabled telemedicine application and verified that I am speaking with the correct person using two identifiers.  Patient Location: Home  Provider Location: Office/Clinic  I discussed the limitations of evaluation and management by telemedicine. The patient expressed understanding and agreed to proceed.  Subjective:   GAVEN EUGENE is a 62 y.o. male who presents for Medicare Annual/Subsequent preventive examination.  Review of Systems     Cardiac Risk Factors include: advanced age (>38mn, >>53women);hypertension;dyslipidemia;male gender;diabetes mellitus;obesity (BMI >30kg/m2)     Objective:    Today's Vitals   09/22/22 0908  Weight: 215 lb (97.5 kg)   Body mass index is 30.85 kg/m.     09/20/2020    2:04 PM 01/11/2020    8:44 AM 05/11/2019   11:43 AM 05/05/2019    5:17 AM 06/16/2018    8:27 AM 06/07/2018    9:03 AM 09/21/2017   11:02 AM  Advanced Directives  Does Patient Have a Medical Advance Directive? No Yes Yes Yes No No Yes  Type of AArmed forces technical officerof ARiver BendLiving will HPatillasLiving will   [place]Living will  Copy of [place]emin Chart?       No - copy requested  Would patient like information on creating a medical advance directive? No - Patient declined    No - Patient declined No - Patient declined     Current Medications (verified) Outpatient Encounter Medications as of 09/22/2022  Medication Sig   amLODipine (NORVASC) 5 MG tablet Take 1 tablet (5 mg total) by mouth every morning.   aspirin 81 MG chewable tablet Chew 81 mg by mouth at bedtime.   atorvastatin (LIPITOR) 40 MG tablet TAKE ONE TABLET BY MOUTH EVERY MORNING   Blood Glucose Monitoring Suppl (ONETOUCH VERIO) w/Device KIT Use to check blood sugars TID. Dx: E11.9   carvedilol (COREG) 6.25 MG tablet TAKE ONE TABLET BY MOUTH TWICE  DAILY   Cholecalciferol (VITAMIN D3) 50 MCG (2000 UT) capsule Take 1 capsule (2,000 Units total) by mouth daily.   Continuous Blood Gluc Receiver (FREESTYLE LIBRE 2 READER) DEVI Use with sensors to monitor sugar continuously   Continuous Blood Gluc Sensor (FREESTYLE LIBRE 2 SENSOR) MISC Apply sensor every 14 days to monitor sugar continously   cyclobenzaprine (FLEXERIL) 5 MG tablet Take 1 tablet (5 mg total) by mouth 3 (three) times daily as needed for muscle spasms (sedation caution).   diclofenac Sodium (VOLTAREN) 1 % GEL Apply 2-4 g topically 4 (four) times daily.   famotidine (PEPCID) 20 MG tablet TAKE ONE TABLET BY MOUTH EVERYDAY AT BEDTIME   gabapentin (NEURONTIN) 300 MG capsule 1-2 in the PM, sedation caution.   glimepiride (AMARYL) 4 MG tablet TAKE ONE TABLET BY MOUTH TWICE DAILY   guaiFENesin-codeine 100-10 MG/5ML syrup Take 5 mLs by mouth every 6 (six) hours as needed for cough.   insulin degludec (TRESIBA FLEXTOUCH) 100 UNIT/ML FlexTouch Pen Inject 20 Units into the skin daily. Then increase 1 unit daily until fasting BG <150   isosorbide mononitrate (IMDUR) 30 MG 24 hr tablet TAKE ONE TABLET BY MOUTH EVERYDAY AT BEDTIME   losartan (COZAAR) 100 MG tablet TAKE ONE TABLET BY MOUTH EVERYDAY AT BEDTIME   metFORMIN (GLUCOPHAGE) 500 MG tablet TAKE ONE TABLET BY MOUTH TWICE DAILY   nitroGLYCERIN (NITROSTAT) 0.4 MG SL tablet Place 1 tablet (0.4 mg total) under the  tongue every 5 (five) minutes as needed for chest pain.   ondansetron (ZOFRAN) 4 MG tablet Take 4 mg by mouth every 8 (eight) hours as needed.   oxyCODONE-acetaminophen (PERCOCET/ROXICET) 5-325 MG tablet Take 1 tablet by mouth 4 (four) times daily.   pantoprazole (PROTONIX) 40 MG tablet TAKE ONE TABLET BY MOUTH EVERY MORNING   Semaglutide,0.25 or 0.'5MG'$ /DOS, (OZEMPIC, 0.25 OR 0.5 MG/DOSE,) 2 MG/3ML SOPN Inject 0.5 mg into the skin once a week.   tamsulosin (FLOMAX) 0.4 MG CAPS capsule TAKE ONE CAPSULE BY MOUTH EVERY MORNING and TAKE  ONE CAPSULE BY MOUTH EVERYDAY AT BEDTIME   No facility-administered encounter medications on file as of 09/22/2022.    Allergies (verified) Ambien [zolpidem], Lisinopril, and Tramadol   History: Past Medical History:  Diagnosis Date   Anginal pain (HCC)    Basal cell carcinoma of face    Chest pain, unspecified    Chronic kidney disease    kidney stones   Coronary atherosclerosis of native coronary artery    s/p stent x5   Depression    Diabetes mellitus without mention of complication    Diverticulosis    Dyspnea    Dysrhythmia    Erectile dysfunction    Essential hypertension, benign    GERD (gastroesophageal reflux disease)    Heart murmur    History of kidney stones    History of nephrolithiasis    Hyperlipidemia    Migraine    Myocardial infarction ([place])    2016   Neuromuscular disorder ([place])    Osteoarthritis    Pneumonia    Postsurgical percutaneous transluminal coronary angioplasty status    s/p stent x4   Sarcoidosis    Sleep apnea    uses CPAP   Unspecified sleep apnea    uses C-pap   Past Surgical History:  Procedure Laterality Date   ANTERIOR CERVICAL DECOMP/DISCECTOMY FUSION N/A 04/07/2017   Procedure: ANTERIOR CERVICAL DECOMPRESSION FUSION, CERVICAL 4-5 WITH INSTRUMENTATION AND ALLOGRAFT;  Surgeon: Phylliss Bob, MD;  Location: [place];  Service: Orthopedics;  Laterality: N/A;  ANTERIOR CERVICAL DECOMPRESSION FUSION, CERVICAL 4-5 WITH INSTRUMENTATION AND ALLOGRAFT; REQUEST 2.5 HOURS AND FLIP ROOM   APPENDECTOMY  2005   CARPAL TUNNEL RELEASE     CERVICAL FUSION  2009   COLONOSCOPY     CORONARY ANGIOPLASTY WITH STENT PLACEMENT     2004   CORONARY STENT INTERVENTION N/A 10/16/2016   Procedure: Coronary Stent Intervention;  Surgeon: Adrian Prows, MD;  Location: [place] [facility];  Service: Cardiovascular;  Laterality: N/A;   CYSTOSCOPY WITH RETROGRADE PYELOGRAM, URETEROSCOPY AND STENT PLACEMENT Right 05/12/2019   Procedure: CYSTOSCOPY WITH RIGHT  RETROGRADE PYELOGRAM, URETEROSCOPY HOLMIUM LASER AND STENT PLACEMENT;  Surgeon: Lucas Mallow, MD;  Location: [facility];  Service: Urology;  Laterality: Right;   ELBOW SURGERY     bilateral   HAND SURGERY Left    KNEE SURGERY Bilateral    Bilateral    LEFT HEART CATH AND CORONARY ANGIOGRAPHY N/A 10/16/2016   Procedure: Left Heart Cath and Coronary Angiography;  Surgeon: Adrian Prows, MD;  Location: [place] [facility];  Service: Cardiovascular;  Laterality: N/A;   LEFT HEART CATHETERIZATION WITH CORONARY ANGIOGRAM N/A 06/14/2014   Procedure: LEFT HEART CATHETERIZATION WITH CORONARY ANGIOGRAM;  Surgeon: Laverda Page, MD;  Location: [facility];  Service: Cardiovascular;  Laterality: N/A;   LUNG BIOPSY     sarcoid   ROTATOR CUFF REPAIR     left   SHOULDER ARTHROSCOPY WITH  SUBACROMIAL DECOMPRESSION Left 06/16/2018   Procedure: LEFT SHOULDER ARTHROSCOPY WITH ROTATOR CUFF DEBRIDEMENT AND  SUBACROMIAL DECOMPRESSION;  Surgeon: Tania Ade, MD;  Location: [place];  Service: Orthopedics;  Laterality: Left;   Family History  Problem Relation Age of Onset   Arthritis Mother    Depression Mother    Stroke Mother    Arthritis Father    Diabetes Father    Coronary artery disease Father    Colon cancer Neg Hx    Prostate cancer Neg Hx    Esophageal cancer Neg Hx    Rectal cancer Neg Hx    Stomach cancer Neg Hx    Social History   Socioeconomic History   Marital status: Married    Spouse name: Not on file   Number of children: 2   Years of education: Not on file   Highest education level: Not on file  Occupational History   Occupation: disabled    Employer: unemployed  Tobacco Use   Smoking status: Never   Smokeless tobacco: Former    Types: Chew    Quit date: 02/06/2016  Vaping Use   Vaping Use: Never used  Substance and Sexual Activity   Alcohol use: No    Alcohol/week: 0.0 standard drinks of alcohol   Drug use: No   Sexual activity: Yes    Birth control/protection: Condom   Other Topics Concern   Not on file  Social History Narrative   Married, [phone] kids   On disability   Social Determinants of Health   Financial Resource Strain: Low Risk  (09/22/2022)   Overall Financial Resource Strain (CARDIA)    Difficulty of Paying Living Expenses: Not hard at all  Food Insecurity: No Food Insecurity (09/22/2022)   Hunger Vital Sign    Worried About Running Out of Food in the Last Year: Never true    Ran Out of Food in the Last Year: Never true  Transportation Needs: No Transportation Needs (09/22/2022)   PRAPARE - Hydrologist (Medical): No    Lack of Transportation (Non-Medical): No  Physical Activity: Inactive (09/22/2022)   Exercise Vital Sign    Days of Exercise per Week: 0 days    Minutes of Exercise per Session: 0 min  Stress: No Stress Concern Present (09/22/2022)   [place]    Feeling of Stress : Not at all  Social Connections: Moderately Isolated (09/22/2022)   Social Connection and Isolation Panel [NHANES]    Frequency of Communication with Friends and Family: Once a week    Frequency of Social Gatherings with Friends and Family: More than three times a week    Attends Religious Services: Never    Marine scientist or Organizations: No    Attends Music therapist: Never    Marital Status: Married    Tobacco Counseling Counseling given: Not Answered   Clinical Intake:  Pre-visit preparation completed: Yes  Pain : No/denies pain     BMI - recorded: 30.85 Nutritional Status: BMI > 30  Obese Nutritional Risks: None Diabetes: Yes CBG done?: Yes (282 per pt) CBG resulted in Enter/ Edit results?: No Did pt. bring in CBG monitor from home?: No  How often do you need to have someone help you when you read instructions, pamphlets, or other written materials from your doctor or pharmacy?: 1 - Never  Diabetic?Nutrition Risk  Assessment:  Has the patient had any N/V/D within the last  2 months?  No  Does the patient have any non-healing wounds?  No  Has the patient had any unintentional weight loss or weight gain?  No   Diabetes:  Is the patient diabetic?  Yes  If diabetic, was a CBG obtained today?  Yes  Did the patient bring in their glucometer from home?  No  How often do you monitor your CBG's? Freestyle libre.   Financial Strains and Diabetes Management:  Are you having any financial strains with the device, your supplies or your medication? No .  Does the patient want to be seen by Chronic Care Management for management of their diabetes?  No  Would the patient like to be referred to a Nutritionist or for Diabetic Management?  No   Diabetic Exams:  Diabetic Eye Exam: Completed 05/15/22 Diabetic Foot Exam: Completed 06/12/22  Interpreter Needed?: No  Information entered by :: Charlott Rakes, LPN   Activities of Daily Living    09/22/2022    9:18 AM  In your present state of health, do you have any difficulty performing the following activities:  Hearing? 1  Comment slight hearing loss  Vision? 0  Difficulty concentrating or making decisions? 0  Walking or climbing stairs? 1  Comment at times get tired  Dressing or bathing? 0  Doing errands, shopping? 0  Preparing Food and eating ? N  Using the Toilet? N  In the past six months, have you accidently leaked urine? N  Do you have problems with loss of bowel control? N  Managing your Medications? N  Managing your Finances? N  Housekeeping or managing your Housekeeping? N    Patient Care Team: Tonia Ghent, MD as PCP - General (Family Medicine) Adrian Prows, MD as Consulting Physician (Cardiology) Charlton Haws, [facility] as Pharmacist (Pharmacist)  Indicate any recent Medical Services you may have received from other than [facility] providers in the past year (date may be approximate).     Assessment:   This is a routine wellness  examination for [place].  Hearing/Vision screen Hearing Screening - Comments:: Pt stated slight hearing loss  Vision Screening - Comments:: Pt follows up with [facility]   Dietary issues and exercise activities discussed: Current Exercise Habits: The patient has a physically strenuous job, but has no regular exercise apart from work.   Goals Addressed             This Visit's Progress    Patient Stated       None at this time        Depression Screen    09/22/2022    9:16 AM 12/23/2021   11:03 AM 09/20/2020    2:06 PM 04/21/2018    9:57 AM 10/11/2017   11:18 AM 09/21/2017   11:00 AM 02/16/2013    8:57 AM  PHQ 2/9 Scores  PHQ - 2 Score 0 0 0 0 0 0 0  PHQ- 9 Score   0        Fall Risk    09/22/2022    9:18 AM 09/20/2020    2:05 PM 01/11/2020    8:44 AM 04/21/2018    9:57 AM 10/11/2017   11:18 AM  Fall Risk   Falls in the past year? '1 1 1 '$ No Yes  Number falls in past yr: '1 1 1  2 '$ or more  Injury with Fall? 1 0 1  No  Comment concussion      Risk for fall due to : Impaired  balance/gait;Impaired vision;Impaired mobility History of fall(s);Medication side effect     Follow up Falls prevention discussed Falls evaluation completed;Falls prevention discussed       FALL RISK PREVENTION PERTAINING TO THE HOME:  Any stairs in or around the home? Yes  If so, are there any without handrails? No  Home free of loose throw rugs in walkways, pet beds, electrical cords, etc? Yes  Adequate lighting in your home to reduce risk of falls? Yes   ASSISTIVE DEVICES UTILIZED TO PREVENT FALLS:  Life alert? No  Use of a cane, walker or w/c? No  Grab bars in the bathroom? No  Shower chair or bench in shower? No  Elevated toilet seat or a handicapped toilet? No   TIMED UP AND GO:  Was the test performed? No .   Cognitive Function:    09/20/2020    2:10 PM  MMSE - Mini Mental State Exam  Orientation to time 5  Orientation to Place 5  Registration 3  Attention/  Calculation 5  Recall 3  Language- repeat 1        09/22/2022    9:19 AM  6CIT Screen  What Year? 0 points  What month? 0 points  What time? 0 points  Count back from 20 0 points  Months in reverse 0 points  Repeat phrase 0 points  Total Score 0 points    Immunizations Immunization History  Administered Date(s) Administered   Fluad Quad(high Dose 65+) 06/12/2022   Influenza Inj Mdck Quad Pf 06/02/2018   Influenza Whole 09/17/2010   Influenza,inj,Quad PF,6+ Mos 06/08/2013, 05/22/2014, 05/12/2016, 05/08/2019, 05/27/2020   Influenza-Unspecified 06/08/2013, 05/22/2014, 06/08/2015, 05/12/2016, 05/21/2017, 06/02/2018, 05/09/2019, 05/21/2021   PFIZER(Purple Top)SARS-COV-2 Vaccination 11/27/2019, 12/25/2019, 08/07/2020, 05/21/2021   Pneumococcal Polysaccharide-23 09/08/2007, 02/16/2013   Td 09/07/2009   Td (Adult), 2 Lf Tetanus Toxid, Preservative Free 09/07/2009   Zoster Recombinat (Shingrix) 05/29/2022    TDAP status: Due, Education has been provided regarding the importance of this vaccine. Advised may receive this vaccine at local pharmacy or Health Dept. Aware to provide a copy of the vaccination record if obtained from local pharmacy or Health Dept. Verbalized acceptance and understanding.  Flu Vaccine status: Up to date    Covid-19 vaccine status: Completed vaccines  Qualifies for Shingles Vaccine? Yes   Zostavax completed Yes   Shingrix Completed?: No.    Education has been provided regarding the importance of this vaccine. Patient has been advised to call insurance company to determine out of pocket expense if they have not yet received this vaccine. Advised may also receive vaccine at local pharmacy or Health Dept. Verbalized acceptance and understanding.  Screening Tests Health Maintenance  Topic Date Due   COLONOSCOPY (Pts 45-54yr Insurance coverage will need to be confirmed)  03/23/2018   DTaP/Tdap/Td (2 - Tdap) 09/08/2019   Zoster Vaccines- Shingrix (2 of 2)  07/24/2022   COVID-19 Vaccine (5 - 2023-24 season) 09/27/2022 (Originally 05/08/2022)   HEMOGLOBIN A1C  03/12/2023   OPHTHALMOLOGY EXAM  05/16/2023   FOOT EXAM  06/13/2023   Diabetic kidney evaluation - eGFR measurement  09/12/2023   Diabetic kidney evaluation - Urine ACR  09/12/2023   Medicare Annual Wellness (AWV)  09/23/2023   INFLUENZA VACCINE  Completed   Hepatitis C Screening  Completed   HIV Screening  Completed   HPV VACCINES  Aged Out    Health Maintenance  Health Maintenance Due  Topic Date Due   COLONOSCOPY (Pts 45-439yrInsurance coverage will need to be  confirmed)  03/23/2018   DTaP/Tdap/Td (2 - Tdap) 09/08/2019   Zoster Vaccines- Shingrix (2 of 2) 07/24/2022    Colorectal cancer screening: Referral to GI placed 09/22/22. Pt aware the office will call re: appt.  Additional Screening:  Hepatitis C Screening:  Completed 11/15/15  Vision Screening: Recommended annual ophthalmology exams for early detection of glaucoma and other disorders of the eye. Is the patient up to date with their annual eye exam?  Yes  Who is the provider or what is the name of the office in which the patient attends annual eye exams? [facility]  If pt is not established with a provider, would they like to be referred to a provider to establish care? No .   Dental Screening: Recommended annual dental exams for proper oral hygiene  Community Resource Referral / Chronic Care Management: CRR required this visit?  No   CCM required this visit?  No      Plan:     I have personally reviewed and noted the following in the patient's chart:   Medical and social history Use of alcohol, tobacco or illicit drugs  Current medications and supplements including opioid prescriptions. Patient is currently taking opioid prescriptions. Information provided to patient regarding non-opioid alternatives. Patient advised to discuss non-opioid treatment plan with their provider. Functional ability  and status Nutritional status Physical activity Advanced directives List of other physicians Hospitalizations, surgeries, and ER visits in previous 12 months Vitals Screenings to include cognitive, depression, and falls Referrals and appointments  In addition, I have reviewed and discussed with patient certain preventive protocols, quality metrics, and best practice recommendations. A written personalized care plan for preventive services as well as general preventive health recommendations were provided to patient.     Willette Brace, LPN   [id number]   Nurse Notes: none

## 2022-09-22 NOTE — Patient Instructions (Signed)
[name] , Thank you for taking time to come for your Medicare Wellness Visit. I appreciate your ongoing commitment to your health goals. Please review the following plan we discussed and let me know if I can assist you in the future.   These are the goals we discussed:  Goals      Patient Stated     09/20/2020, I will maintain and continue medications as prescribed.         This is a list of the screening recommended for you and due dates:  Health Maintenance  Topic Date Due   Colon Cancer Screening  03/23/2018   DTaP/Tdap/Td vaccine (2 - Tdap) 09/08/2019   Zoster (Shingles) Vaccine (2 of 2) 07/24/2022   COVID-19 Vaccine (5 - 2023-24 season) 09/27/2022*   Hemoglobin A1C  03/12/2023   Eye exam for diabetics  05/16/2023   Complete foot exam   06/13/2023   Yearly kidney function blood test for diabetes  09/12/2023   Yearly kidney health urinalysis for diabetes  09/12/2023   Medicare Annual Wellness Visit  09/23/2023   Flu Shot  Completed   Hepatitis C Screening: USPSTF Recommendation to screen - Ages 18-79 yo.  Completed   HIV Screening  Completed   HPV Vaccine  Aged Out  *Topic was postponed. The date shown is not the original due date.    Advanced directives: Please bring a copy of your health care power of attorney and living will to the office at your convenience.  Conditions/risks identified: none at this time   Next appointment: Follow up in one year for your annual wellness visit   Preventive Care 40-64 Years, Male Preventive care refers to lifestyle choices and visits with your health care provider that can promote health and wellness. What does preventive care include? A yearly physical exam. This is also called an annual well check. Dental exams once or twice a year. Routine eye exams. Ask your health care provider how often you should have your eyes checked. Personal lifestyle choices, including: Daily care of your teeth and gums. Regular physical  activity. Eating a healthy diet. Avoiding tobacco and drug use. Limiting alcohol use. Practicing safe sex. Taking low-dose aspirin every day starting at age 23. What happens during an annual well check? The services and screenings done by your health care provider during your annual well check will depend on your age, overall health, lifestyle risk factors, and family history of disease. Counseling  Your health care provider may ask you questions about your: Alcohol use. Tobacco use. Drug use. Emotional well-being. Home and relationship well-being. Sexual activity. Eating habits. Work and work Statistician. Screening  You may have the following tests or measurements: Height, weight, and BMI. Blood pressure. Lipid and cholesterol levels. These may be checked every 5 years, or more frequently if you are over 25 years old. Skin check. Lung cancer screening. You may have this screening every year starting at age 80 if you have a 30-pack-year history of smoking and currently smoke or have quit within the past 15 years. Fecal occult blood test (FOBT) of the stool. You may have this test every year starting at age 66. Flexible sigmoidoscopy or colonoscopy. You may have a sigmoidoscopy every 5 years or a colonoscopy every 10 years starting at age 66. Prostate cancer screening. Recommendations will vary depending on your family history and other risks. Hepatitis C blood test. Hepatitis B blood test. Sexually transmitted disease (STD) testing. Diabetes screening. This is done by checking your  blood sugar (glucose) after you have not eaten for a while (fasting). You may have this done every 1-3 years. Discuss your test results, treatment options, and if necessary, the need for more tests with your health care provider. Vaccines  Your health care provider may recommend certain vaccines, such as: Influenza vaccine. This is recommended every year. Tetanus, diphtheria, and acellular pertussis  (Tdap, Td) vaccine. You may need a Td booster every 10 years. Zoster vaccine. You may need this after age 44. Pneumococcal 13-valent conjugate (PCV13) vaccine. You may need this if you have certain conditions and have not been vaccinated. Pneumococcal polysaccharide (PPSV23) vaccine. You may need one or two doses if you smoke cigarettes or if you have certain conditions. Talk to your health care provider about which screenings and vaccines you need and how often you need them. This information is not intended to replace advice given to you by your health care provider. Make sure you discuss any questions you have with your health care provider. Document Released: 09/20/2015 Document Revised: 05/13/2016 Document Reviewed: 06/25/2015 Elsevier Interactive Patient Education  2017 [place] Prevention in the Home Falls can cause injuries. They can happen to people of all ages. There are many things you can do to make your home safe and to help prevent falls. What can I do on the outside of my home? Regularly fix the edges of walkways and driveways and fix any cracks. Remove anything that might make you trip as you walk through a door, such as a raised step or threshold. Trim any bushes or trees on the path to your home. Use bright outdoor lighting. Clear any walking paths of anything that might make someone trip, such as rocks or tools. Regularly check to see if handrails are loose or broken. Make sure that both sides of any steps have handrails. Any raised decks and porches should have guardrails on the edges. Have any leaves, snow, or ice cleared regularly. Use sand or salt on walking paths during winter. Clean up any spills in your garage right away. This includes oil or grease spills. What can I do in the bathroom? Use night lights. Install grab bars by the toilet and in the tub and shower. Do not use towel bars as grab bars. Use non-skid mats or decals in the tub or shower. If  you need to sit down in the shower, use a plastic, non-slip stool. Keep the floor dry. Clean up any water that spills on the floor as soon as it happens. Remove soap buildup in the tub or shower regularly. Attach bath mats securely with double-sided non-slip rug tape. Do not have throw rugs and other things on the floor that can make you trip. What can I do in the bedroom? Use night lights. Make sure that you have a light by your bed that is easy to reach. Do not use any sheets or blankets that are too big for your bed. They should not hang down onto the floor. Have a firm chair that has side arms. You can use this for support while you get dressed. Do not have throw rugs and other things on the floor that can make you trip. What can I do in the kitchen? Clean up any spills right away. Avoid walking on wet floors. Keep items that you use a lot in easy-to-reach places. If you need to reach something above you, use a strong step stool that has a grab bar. Keep electrical cords out of  the way. Do not use floor polish or wax that makes floors slippery. If you must use wax, use non-skid floor wax. Do not have throw rugs and other things on the floor that can make you trip. What can I do with my stairs? Do not leave any items on the stairs. Make sure that there are handrails on both sides of the stairs and use them. Fix handrails that are broken or loose. Make sure that handrails are as long as the stairways. Check any carpeting to make sure that it is firmly attached to the stairs. Fix any carpet that is loose or worn. Avoid having throw rugs at the top or bottom of the stairs. If you do have throw rugs, attach them to the floor with carpet tape. Make sure that you have a light switch at the top of the stairs and the bottom of the stairs. If you do not have them, ask someone to add them for you. What else can I do to help prevent falls? Wear shoes that: Do not have high heels. Have rubber  bottoms. Are comfortable and fit you well. Are closed at the toe. Do not wear sandals. If you use a stepladder: Make sure that it is fully opened. Do not climb a closed stepladder. Make sure that both sides of the stepladder are locked into place. Ask someone to hold it for you, if possible. Clearly mark and make sure that you can see: Any grab bars or handrails. First and last steps. Where the edge of each step is. Use tools that help you move around (mobility aids) if they are needed. These include: Canes. Walkers. Scooters. Crutches. Turn on the lights when you go into a dark area. Replace any light bulbs as soon as they burn out. Set up your furniture so you have a clear path. Avoid moving your furniture around. If any of your floors are uneven, fix them. If there are any pets around you, be aware of where they are. Review your medicines with your doctor. Some medicines can make you feel dizzy. This can increase your chance of falling. Ask your doctor what other things that you can do to help prevent falls. This information is not intended to replace advice given to you by your health care provider. Make sure you discuss any questions you have with your health care provider. Document Released: 06/20/2009 Document Revised: 01/30/2016 Document Reviewed: 09/28/2014 Elsevier Interactive Patient Education  2017 [employer].

## 2022-09-24 ENCOUNTER — Ambulatory Visit: Payer: Medicare HMO | Admitting: Pharmacist

## 2022-09-24 NOTE — Progress Notes (Signed)
Care Management & Coordination Services Pharmacy Note  09/24/2022 Name:  Tony Manning MRN:  [id number] DOB:  11-27-1960  Summary: F/U visit -DM: A1c 8.3% (10/23); Pt restarted Ozempic 0.5 mg Sun 1/14 with Tyler Aas 20 units daily (transitioned from [place]); He is up to 22 units of [place] today, which he takes in the morning (6-7am) -Reviewed AGP report: 09/11/22 to 09/24/22. Sensor active: 85%  Time in range (70-180): 33% (goal > 70%)  High (180-250): 40%  Very high (>250): 24%  Low (< 70): 3% (goal < 4%)  GMI: 8.1%; Average glucose: 202 - pt is dropping low (~60) 1-2pm nearly every day after peaking high before noon (200-300); pt denies activity or medication around this time that would explain sudden drop   Recommendations:  -Advised pt to eat carbs with lunch (~12pm) to try to avoid afternoon lows -Recommend to hold AM dose of glimepiride to try to prevent midday hypoglycemia; advised to wait 2-3 days between insulin adjustments to avoid titrating too quickly   Follow up plan: -Pharmacist follow up televisit scheduled for 2 weeks -PCP visit due ~12/11/22 (3-mo)   Subjective: Tony Manning is an 62 y.o. year old male who is a primary patient of [name], Elveria Rising, MD.  The care coordination team was consulted for assistance with disease management and care coordination needs.    Engaged with patient by telephone for follow up visit.  Patient Care Team: Tonia Ghent, MD as PCP - General (Family Medicine) Adrian Prows, MD as Consulting Physician (Cardiology) Charlton Haws, [facility] as Pharmacist (Pharmacist)  Recent office visits: 09/11/22 Dr [name] OV: f/u - A1c 8.3%. Increase VitD to 4000 IU daily. Will change Soliqua back to Ozempic  08/13/22 Dr [name] OV: viral URI - covid negative. Rx doxycycline. Try work 4-5 hrs per shift.    08/04/22 Dr [name] OV: Concussion f/u - brain rest. Avoid screens. Don't drive. F/u 3 weeks.   07/20/22 Dr Lorelei Pont OV: f/u  concussion - still very symptomatic. Remain out of work x next 3 weeks. Refrain from driving. Limit screen use. RTC 3 weeks.   07/06/22 Dr Lorelei Pont OV: concussion, fall at work. Hold out of work and limit physical activity - no driving, using computers or smart phones. F/u 2 weeks.   06/29/22 Dr [name] OV: DM - lows in PM. Reduce Soliqua to 40 units.   06/19/22 Dr [name] OV: f/u - waiting on meter replacement. Leg pain - Increase gabapentin to 300 mg AM and 600 mg PM.   06/12/22 Dr [name] OV: f/u - A1c 8.0%; update about sugars in 2-3 weeks  Recent consult visits: 05/06/22 Dr Einar Gip (Cardiology): f/u HF, doing well, no changes.   02/16/22 NP Tammy Parrett (pulmonary): f/u Sarcoid. D/c pregabalin. D/c lasix, Kcl. Ordered PFTs.   01/05/22 PA Lawerance Cruel (cardiology): SOB/edema. Rx'd furosemide 20 mg and Kcl 10 mEq. Reduced amlodipine to 5 mg.   10/02/21 Dr Vikki Ports (psychology): chronic pain  - stable for spinal cord stimulator trial  Hospital visits: None in previous 6 months   Objective:  Lab Results  Component Value Date   CREATININE 1.78 (H) 09/11/2022   BUN 21 09/11/2022   GFR 47.53 (L) 01/09/2022   GFRNONAA 54 (L) 11/12/2020   GFRAA 42 (L) 05/05/2019   NA 142 09/11/2022   K 4.5 09/11/2022   CALCIUM 9.7 09/11/2022   CO2 23 09/11/2022   GLUCOSE 114 (H) 09/11/2022    Lab Results  Component Value Date/Time  HGBA1C 8.3 (H) 09/11/2022 04:02 PM   HGBA1C 8.0 (A) 06/12/2022 07:57 AM   HGBA1C 7.2 (H) 12/23/2021 11:47 AM   GFR 47.53 (L) 01/09/2022 12:05 PM   GFR 60.28 12/23/2021 11:47 AM   MICROALBUR 1.4 09/11/2022 04:02 PM   MICROALBUR 1.5 08/15/2012 09:05 AM    Last diabetic Eye exam:  Lab Results  Component Value Date/Time   HMDIABEYEEXA Retinopathy (A) 05/15/2022 12:00 AM    Last diabetic Foot exam: No results found for: "HMDIABFOOTEX"   Lab Results  Component Value Date   CHOL 103 09/11/2022   HDL 38 (L) 09/11/2022   LDLCALC 44 09/11/2022   TRIG 127  09/11/2022   CHOLHDL 2.7 09/11/2022       Latest Ref Rng & Units 09/11/2022    4:02 PM 06/09/2021    9:23 AM 01/27/2021   12:40 PM  Hepatic Function  Total Protein 6.1 - 8.1 g/dL 7.0  6.8  7.5   Albumin 3.5 - 5.2 g/dL  4.3  4.8   AST 10 - 35 U/L '22  18  20   '$ ALT 9 - 46 U/L '22  23  21   '$ Alk Phosphatase 39 - 117 U/L  90  78   Total Bilirubin 0.2 - 1.2 mg/dL 0.5  0.5  0.6   Bilirubin, Direct 0.0 - 0.3 mg/dL   0.1     Lab Results  Component Value Date/Time   TSH 1.34 01/27/2021 12:40 PM   TSH 2.22 03/21/2018 12:05 PM       Latest Ref Rng & Units 09/11/2022    4:02 PM 12/23/2021   11:47 AM 06/09/2021    9:23 AM  CBC  WBC 3.8 - 10.8 Thousand/uL 5.8  5.9  7.2   Hemoglobin 13.2 - 17.1 g/dL 13.6  13.4  13.7   Hematocrit 38.5 - 50.0 % 38.6  39.6  40.9   Platelets 140 - 400 Thousand/uL 214  193.0  198.0     Lab Results  Component Value Date/Time   VD25OH 29 (L) 09/11/2022 04:02 PM   VD25OH 23.11 (L) 12/23/2021 11:47 AM   VD25OH 19.37 (L) 03/21/2018 12:05 PM   VITAMINB12 1,316 (H) 09/11/2022 04:02 PM   VITAMINB12 290 12/23/2021 11:47 AM    Clinical ASCVD: Yes  The ASCVD Risk score (Arnett DK, et al., 2019) failed to calculate for the following reasons:   The patient has a prior MI or stroke diagnosis       09/22/2022    9:16 AM 12/23/2021   11:03 AM 09/20/2020    2:06 PM  Depression screen PHQ 2/9  Decreased Interest 0 0 0  Down, Depressed, Hopeless 0 0 0  PHQ - 2 Score 0 0 0  Altered sleeping   0  Tired, decreased energy   0  Change in appetite   0  Feeling bad or failure about yourself    0  Trouble concentrating   0  Moving slowly or fidgety/restless   0  Suicidal thoughts   0  PHQ-9 Score   0  Difficult doing work/chores   Not difficult at all    Social History   Tobacco Use  Smoking Status Never  Smokeless Tobacco Former   Types: Chew   Quit date: 02/06/2016   BP Readings from Last 3 Encounters:  09/11/22 104/60  08/13/22 118/62  08/04/22 122/68   Pulse  Readings from Last 3 Encounters:  09/11/22 67  08/13/22 68  08/04/22 70   Wt Readings from  Last 3 Encounters:  09/22/22 215 lb (97.5 kg)  09/11/22 215 lb (97.5 kg)  08/13/22 222 lb (100.7 kg)   BMI Readings from Last 3 Encounters:  09/22/22 30.85 kg/m  09/11/22 30.85 kg/m  08/13/22 31.85 kg/m    Allergies  Allergen Reactions   Ambien [Zolpidem] Other (See Comments)    Parasomnias, sleep walking   Lisinopril Cough   Tramadol Diarrhea    Medications Reviewed Today     Reviewed by Charlton Haws, [facility] (Pharmacist) on 09/24/22 at 21  Med List Status: <None>   Medication Order Taking? Sig Documenting Provider Last Dose Status Informant  amLODipine (NORVASC) 5 MG tablet [id number] Yes Take 1 tablet (5 mg total) by mouth every morning. Cantwell, Celeste C, PA-C Taking Active   aspirin 81 MG chewable tablet [id number] Yes Chew 81 mg by mouth at bedtime. [provider] Taking Active Self  atorvastatin (LIPITOR) 40 MG tablet [id number] Yes TAKE ONE TABLET BY MOUTH EVERY MORNING Tonia Ghent, MD Taking Active   Blood Glucose Monitoring Suppl [facility] VERIO) w/Device KIT [id number] Yes Use to check blood sugars TID. Dx: E11.9 Tonia Ghent, MD Taking Active   carvedilol (COREG) 6.25 MG tablet [id number] Yes TAKE ONE TABLET BY MOUTH TWICE DAILY Adrian Prows, MD Taking Active   Cholecalciferol (VITAMIN D3) 50 MCG (2000 UT) capsule [id number] Yes Take 1 capsule (2,000 Units total) by mouth daily. Tonia Ghent, MD Taking Active   Continuous Blood Gluc Sensor (FREESTYLE LIBRE 3 SENSOR) [place] [id number] Yes by Does not apply route. [provider] Taking Active   cyclobenzaprine (FLEXERIL) 5 MG tablet [id number] Yes Take 1 tablet (5 mg total) by mouth 3 (three) times daily as needed for muscle spasms (sedation caution). Tonia Ghent, MD Taking Active   diclofenac Sodium (VOLTAREN) 1 % GEL [id number] Yes Apply 2-4 g topically 4 (four) times daily. Tonia Ghent,  MD Taking Active   famotidine (PEPCID) 20 MG tablet [id number] Yes TAKE ONE TABLET BY MOUTH EVERYDAY AT BEDTIME Parrett, Tammy S, NP Taking Active   gabapentin (NEURONTIN) 300 MG capsule [id number] Yes 1-2 in the PM, sedation caution. Tonia Ghent, MD Taking Active   glimepiride (AMARYL) 4 MG tablet [id number] Yes TAKE ONE TABLET BY MOUTH TWICE DAILY Tonia Ghent, MD Taking Active   guaiFENesin-codeine 100-10 MG/5ML syrup [id number] Yes Take 5 mLs by mouth every 6 (six) hours as needed for cough. Tonia Ghent, MD Taking Active   insulin degludec [facility]) 100 UNIT/ML FlexTouch Pen [id number] Yes Inject 20 Units into the skin daily. Then increase 1 unit daily until fasting BG <150  Patient taking differently: Inject 22 Units into the skin daily. Then increase 1 unit daily until fasting BG <150   Tonia Ghent, MD Taking Active   isosorbide mononitrate (IMDUR) 30 MG 24 hr tablet [id number] Yes TAKE ONE TABLET BY MOUTH EVERYDAY AT BEDTIME Adrian Prows, MD Taking Active   losartan (COZAAR) 100 MG tablet [id number] Yes TAKE ONE TABLET BY MOUTH EVERYDAY AT BEDTIME Adrian Prows, MD Taking Active   metFORMIN (GLUCOPHAGE) 500 MG tablet [id number] Yes TAKE ONE TABLET BY MOUTH TWICE DAILY Tonia Ghent, MD Taking Active   nitroGLYCERIN (NITROSTAT) 0.4 MG SL tablet [id number] Yes Place 1 tablet (0.4 mg total) under the tongue every 5 (five) minutes as needed for chest pain. Miquel Dunn, NP Taking Active   ondansetron [facility]) 4 MG tablet [id number] Yes Take 4 mg by mouth every 8 (eight) hours  as needed. [provider] Taking Active   oxyCODONE-acetaminophen (PERCOCET/ROXICET) 5-325 MG tablet [id number] Yes Take 1 tablet by mouth 4 (four) times daily. [provider] Taking Active   pantoprazole (PROTONIX) 40 MG tablet [id number] Yes TAKE ONE TABLET BY MOUTH EVERY MORNING Tonia Ghent, MD Taking Active   Semaglutide,0.25 or 0.'5MG'$ /DOS, (OZEMPIC, 0.25 OR 0.5 MG/DOSE,) 2  MG/3ML SOPN [id number] Yes Inject 0.5 mg into the skin once a week. Tonia Ghent, MD Taking Active   tamsulosin [facility]) 0.4 MG CAPS capsule [id number] Yes TAKE ONE CAPSULE BY MOUTH EVERY MORNING and TAKE ONE CAPSULE BY MOUTH EVERYDAY AT BEDTIME Tonia Ghent, MD Taking Active             Patient Active Problem List   Diagnosis Date Noted   URI (upper respiratory infection) 08/16/2022   Concussion 08/05/2022   Acute cough 02/05/2022   Near syncope 12/25/2021   COVID-19 11/14/2021   Advance care planning 06/13/2021   Insomnia 06/13/2021   B12 deficiency 01/30/2021   Fatigue 01/29/2021   Abdominal discomfort 01/05/2021   SOB (shortness of breath) 11/12/2020   Sarcoidosis 11/12/2020   H/O colonoscopy 10/27/2020   Nocturia 05/03/2020   Right sided weakness 03/06/2020   Low back pain 01/29/2020   Facial weakness 11/16/2019   Hematuria 03/22/2018   Renal stones 03/22/2018   Vitamin D deficiency, unspecified 10/04/2017   Chronic neck pain (Primary Area of Pain) (B) (L>R) 09/21/2017   Chronic upper extremity pain (Secondary Area of Pain) (Bilateral) (L>R) 09/21/2017   Chronic left shoulder pain (Tertiary Area of Pain) 09/21/2017   Chronic low back pain (Fourth Area of Pain) (B) (R>L) 09/21/2017   Right leg pain 09/21/2017   Other long term (current) drug therapy 09/21/2017   Other specified health status 09/21/2017   Other chronic pain 08/22/2017   Other fatigue 05/10/2017   Radiculopathy 04/07/2017   Angina pectoris ([place]) 10/15/2016   Cervical radiculitis 07/23/2016   Dysphagia 06/12/2016   Sciatica 02/06/2016   Plantar fasciitis 01/16/2016   Right sided sciatica 05/16/2015   Hand pain 04/05/2015   Premature ejaculation 09/17/2014   Heel pain 09/17/2014   NSTEMI (non-ST elevated myocardial infarction) ([place]) 06/13/2014   Atypical chest pain 06/06/2014   Memory change 05/24/2014   Benign paroxysmal positional vertigo 07/13/2013   Orthostasis 07/13/2013   Migraine,  unspecified, without mention of intractable migraine without mention of status migrainosus 04/03/2013   Pain in joint, ankle and foot 10/19/2012   Irritable mood 01/18/2012   Neck pain 12/03/2010   Hearing loss 12/03/2010   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   HYPERCHOLESTEROLEMIA 10/20/2010   DEPRESSION 10/20/2010   Osteoarthritis 10/20/2010   ARTHRITIS 10/20/2010   NEPHROLITHIASIS, HX OF 10/20/2010   ESSENTIAL HYPERTENSION, BENIGN 09/30/2010   CORONARY ATHEROSCLEROSIS NATIVE CORONARY ARTERY 09/30/2010   Sleep apnea 09/30/2010   CHEST PAIN UNSPECIFIED 09/30/2010   PERCUTANEOUS TRANSLUMINAL CORONARY ANGIOPLASTY, HX OF 09/30/2010    Immunization History  Administered Date(s) Administered   Fluad Quad(high Dose 65+) 06/12/2022   Influenza Inj Mdck Quad Pf 06/02/2018   Influenza Whole 09/17/2010   Influenza,inj,Quad PF,6+ Mos 06/08/2013, 05/22/2014, 05/12/2016, 05/08/2019, 05/27/2020   Influenza-Unspecified 06/08/2013, 05/22/2014, 06/08/2015, 05/12/2016, 05/21/2017, 06/02/2018, 05/09/2019, 05/21/2021   PFIZER(Purple Top)SARS-COV-2 Vaccination 11/27/2019, 12/25/2019, 08/07/2020, 05/21/2021   Pneumococcal Polysaccharide-23 09/08/2007, 02/16/2013   Td 09/07/2009   Td (Adult), 2 Lf Tetanus Toxid, Preservative Free 09/07/2009   Zoster Recombinat (Shingrix) 05/29/2022     Compliance/Adherence/Medication fill history: Care Gaps:  UACR Colonoscopy (due 03/2018) AWV (due 06/09/22)   Star-Rating Drugs: Atorvastatin - PDC 100% Glimepiride - PDC 100% Metformin - PDC 100% Losartan - PDC 100%  SDOH:  (Social Determinants of Health) assessments and interventions performed: No SDOH Interventions    Flowsheet Row Clinical Support from 09/22/2022 in [place] at [place] [facility] from 11/21/2021 in [place] at [place] [facility] from 12/20/2020 in [place] at [place] [facility]  from 11/27/2020 in [place] at [place] from 09/20/2020 in [place] at [place] Interventions Intervention Not Indicated Intervention Not Indicated -- -- --  Housing Interventions Intervention Not Indicated -- -- -- --  Transportation Interventions Intervention Not Indicated -- -- -- --  Utilities Interventions Intervention Not Indicated -- -- -- --  Depression Interventions/Treatment  -- -- -- -- PHQ2-9 Score <4 Follow-up Not Indicated  Financial Strain Interventions Intervention Not Indicated Other (Comment)  [Renew Novo Cares PAP] Other (Comment)  [Ozempic patient assistance approved] --  [Application for med assistance in process] --  Physical Activity Interventions Intervention Not Indicated -- -- -- --  Stress Interventions Intervention Not Indicated -- -- -- --  Social Connections Interventions Intervention Not Indicated -- -- -- --      SDOH Screenings   Food Insecurity: No Food Insecurity (09/22/2022)  Housing: Low Risk  (09/22/2022)  Transportation Needs: No Transportation Needs (09/22/2022)  Utilities: Not At Risk (09/22/2022)  Alcohol Screen: Low Risk  (09/20/2020)  Depression (PHQ2-9): Low Risk  (09/22/2022)  Financial Resource Strain: Low Risk  (09/22/2022)  Physical Activity: Inactive (09/22/2022)  Social Connections: Moderately Isolated (09/22/2022)  Stress: No Stress Concern Present (09/22/2022)  Tobacco Use: Medium Risk (09/22/2022)    Medication Assistance:  [employer] application pending ([id number])  Medication Access: Within the past 30 days, how often has patient missed a dose of medication? 0 Is a pillbox or other method used to improve adherence? Yes  Factors that may affect medication adherence? financial need Are meds synced by current pharmacy? Yes  Are meds delivered by current pharmacy? Yes  Does patient experience delays in picking up medications due to transportation  concerns? No   [facility] Reviewed: Is patient disadvantaged to use [facility]?: No  Current Rx insurance plan: [employer] Name and location of Current pharmacy:  [facility] - [place], [place] - [address] [place] [address] Phone: [phone] Fax: [phone]  [facility] services reviewed with patient today?: Yes  Packs (last delivery 09/03/22 x 30 ds) Amlodipine '10mg'$ - 1 tablet daily (1 breakfast) Aspirin '81mg'$ - 1 tablet daily (breakfast) Metformin '500mg'$  1 tablet twice daily (1 tablet  breakfast, 1 tablet evening meal) Carvedilol 6.'25mg'$ - 1 tablet twice daily (1 breakfast, 1 evening meal) Isosorbide '30mg'$ - 1 tablet daily (1 bedtime) Tamsulosin 0.'4mg'$  2 capsule daily (1 breakfast, 1 bedtime)   Losartan 100 mg- 1 tablet daily- (1 bedtime) Atorvastatin '40mg'$ - 1 tablet daily (1 breakfast) Glimepiride '4mg'$ - 1 tablet twice daily (1 breakfast, 1 evening meal) Pantoprazole 40 mg- 1 tablet daily (1 breakfast)  Assessment/Plan  Diabetes (A1c goal <7%) -Not ideally controlled - A1c 8.0% (06/2022); Pt restarted Ozempic Sun 1/14 with Tyler Aas 20 units daily (transitioned from [place]); He is up to 22 units of [place] today, which he takes in the morning (6-7am) -Reviewed AGP report: 09/11/22 to 09/24/22. Sensor active: 85%  Time in range (70-180): 33% (goal > 70%)  High (180-250): 40%  Very high (>250): 24%  Low (< 70): 3% (goal < 4%)  GMI: 8.1%; Average glucose: 202   -Current medications: Glimepiride 4 mg BID - Appropriate, Query Effective, Metformin 500 mg BID -Appropriate, Query Effective, Tresiba 22 units daily AM -Appropriate, Query Effective, Ozempic 0.5 mg weekly Sun - Appropriate, Query Effective, Freestyle Libre 3 ([place]) -Medications previously tried: Novolin N 20 u BID, Soliqua (not as effective), Fiasp -Reviewed AGP report, pt is dropping low 1-2pm nearly every day after peaking high before  noon; pt denies activity or medication around this time that would explain sudden drop; -Advised pt to eat carbs with lunch (~12pm) to try to avoid afternoon lows -Recommend to hold AM dose of glimepiride to try to prevent midday hypoglycemia; advised to wait 2-3 days between insulin adjustments to avoid titrating too quickly   Hypertension / CKD Stage 3b (BP goal <130/80) -Controlled - BP at goal in office  -Current treatment: Amodipine 5 mg daily - Appropriate, Effective, Safe, Accessible Carvedilol 6.25 mg BID -Appropriate, Effective, Safe, Accessible Isosorbide MN 30 mg HS -Appropriate, Effective, Safe, Accessible Losartan 100 mg daily -Appropriate, Effective, Safe, Accessible -Medications previously tried: n/a  -Educated on BP goals and benefits of medications for prevention of heart attack, stroke and kidney damage; -Counseled to monitor BP at home periodically, document, and provide log at future appointments -Recommended to continue current medication   Hyperlipidemia / CAD (LDL goal < 70) -Controlled - LDL 59 -Hx CAD. NSTEMI 06/2014 -Current treatment: Atorvastatin 40 mg daily -Appropriate, Effective, Safe, Accessible Aspirin 81 mg daily -Appropriate, Effective, Safe, Accessible Isosorbide [place] 30 mg daily -Appropriate, Effective, Safe, Accessible Nitroglycerin 0.4 mg SL prn -Appropriate, Effective, Safe, Accessible -Medications previously tried: none  -Recommended to continue current medication  Chronic Kidney Disease Stage 3b  -All medications assessed for renal dosing and appropriateness in chronic kidney disease. -Recommended to continue current medication   Chronic pain (Goal: manage symptoms) -Controlled -cervical radiculitis, sciatica, osteoarthritis. Injections from other did not help, going to pain mgmt. -Current treatment  Oxycodone-APAP 5-325 mg - Appropriate, Effective, Safe, Accessible Cyclobenzaprine 5 mg TID prn -Appropriate, Effective, Safe,  Accessible Gabapentin 300 mg -Appropriate, Effective, Safe, Accessible -Medications previously tried: n/a  -Recommended to continue current medication   Health Maintenance -Vaccine gaps: Shingrix       Charlene Brooke, PharmD, BCACP Clinical Pharmacist [facility] at [facility] [phone]

## 2022-09-24 NOTE — Patient Instructions (Signed)
Visit Information  Phone number for Pharmacist: [phone]  Thank you for meeting with me to discuss your medications! Below is a summary of what we talked about during the visit:   Recommendations:  -Advised pt to eat carbs with lunch (~12pm) to try to avoid afternoon lows -Recommend to hold AM dose of glimepiride to try to prevent midday hypoglycemia; advised to wait 2-3 days between insulin adjustments to avoid titrating too quickly   Follow up plan: -Pharmacist follow up televisit scheduled for 2 weeks -PCP visit due ~12/11/22 (3-mo)   Charlene Brooke, PharmD, BCACP Clinical Pharmacist [facility] at [facility] [phone]

## 2022-09-25 ENCOUNTER — Ambulatory Visit: Payer: Medicare HMO

## 2022-09-28 DIAGNOSIS — E1149 Type 2 diabetes mellitus with other diabetic neurological complication: Secondary | ICD-10-CM | POA: Diagnosis not present

## 2022-09-28 DIAGNOSIS — Z794 Long term (current) use of insulin: Secondary | ICD-10-CM | POA: Diagnosis not present

## 2022-10-05 DIAGNOSIS — M75121 Complete rotator cuff tear or rupture of right shoulder, not specified as traumatic: Secondary | ICD-10-CM | POA: Diagnosis not present

## 2022-10-07 ENCOUNTER — Telehealth: Payer: Self-pay

## 2022-10-07 NOTE — Progress Notes (Signed)
Care Management & Coordination Services Pharmacy Team  Reason for Encounter: Appointment Reminder  Contacted patient to confirm telephone appointment with Charlene Brooke, PharmD on 10/12/2022 at 11:45.  Unsuccessful outreach. Left voicemail for patient to return call.  Star Rating Drugs:  Medication:  Last Fill: Day Supply Atorvastatin 40 mg 09/29/22 30 Glimepiride 4 mg 09/29/22 30 Losartan 100 mg 09/29/22 30 Metformin 500 mg 09/29/22 30 Ozempic 0.58 mg PAP  Care Gaps: Annual wellness visit in last year? Yes 09/22/2022  If Diabetic: Last eye exam / retinopathy screening: Up to date Last diabetic foot exam: Up to date  Charlene Brooke, PharmD notified  Marijean Niemann, [place] Assistant [phone]

## 2022-10-12 ENCOUNTER — Encounter: Payer: Medicare HMO | Admitting: Pharmacist

## 2022-10-12 NOTE — Progress Notes (Unsigned)
Care Management & Coordination Services Pharmacy Note  10/12/2022 Name:  Tony Manning MRN:  [id number] DOB:  12/05/1960  Summary: F/U visit -DM: A1c 8.3% (10/23); Pt restarted Ozempic 0.5 mg Sun 1/14 with Tyler Aas 20 units daily (transitioned from [place]); He is up to 22 units of [place] today, which he takes in the morning (6-7am) -Reviewed AGP report: 09/11/22 to 09/24/22. Sensor active: 85%  Time in range (70-180): 33% (goal > 70%)  High (180-250): 40%  Very high (>250): 24%  Low (< 70): 3% (goal < 4%)  GMI: 8.1%; Average glucose: 202 - pt is dropping low (~60) 1-2pm nearly every day after peaking high before noon (200-300); pt denies activity or medication around this time that would explain sudden drop   Recommendations:  -Advised pt to eat carbs with lunch (~12pm) to try to avoid afternoon lows -Recommend to hold AM dose of glimepiride to try to prevent midday hypoglycemia; advised to wait 2-3 days between insulin adjustments to avoid titrating too quickly   Follow up plan: -Pharmacist follow up televisit scheduled for 2 weeks -PCP visit due ~12/11/22 (3-mo)   Subjective: Tony Manning is an 62 y.o. year old male who is a primary patient of [name], Elveria Rising, MD.  The care coordination team was consulted for assistance with disease management and care coordination needs.    Engaged with patient by telephone for follow up visit.  Patient Care Team: Tonia Ghent, MD as PCP - General (Family Medicine) Adrian Prows, MD as Consulting Physician (Cardiology) Charlton Haws, [facility] as Pharmacist (Pharmacist)  Recent office visits: 09/11/22 Dr [name] OV: f/u - A1c 8.3%. Increase VitD to 4000 IU daily. Will change Soliqua back to Ozempic  08/13/22 Dr [name] OV: viral URI - covid negative. Rx doxycycline. Try work 4-5 hrs per shift.    08/04/22 Dr [name] OV: Concussion f/u - brain rest. Avoid screens. Don't drive. F/u 3 weeks.   07/20/22 Dr Lorelei Pont OV: f/u  concussion - still very symptomatic. Remain out of work x next 3 weeks. Refrain from driving. Limit screen use. RTC 3 weeks.   07/06/22 Dr Lorelei Pont OV: concussion, fall at work. Hold out of work and limit physical activity - no driving, using computers or smart phones. F/u 2 weeks.   06/29/22 Dr [name] OV: DM - lows in PM. Reduce Soliqua to 40 units.   06/19/22 Dr [name] OV: f/u - waiting on meter replacement. Leg pain - Increase gabapentin to 300 mg AM and 600 mg PM.   06/12/22 Dr [name] OV: f/u - A1c 8.0%; update about sugars in 2-3 weeks  Recent consult visits: 05/06/22 Dr Einar Gip (Cardiology): f/u HF, doing well, no changes.   02/16/22 NP Tammy Parrett (pulmonary): f/u Sarcoid. D/c pregabalin. D/c lasix, Kcl. Ordered PFTs.   01/05/22 PA Lawerance Cruel (cardiology): SOB/edema. Rx'd furosemide 20 mg and Kcl 10 mEq. Reduced amlodipine to 5 mg.   10/02/21 Dr Vikki Ports (psychology): chronic pain  - stable for spinal cord stimulator trial  Hospital visits: None in previous 6 months   Objective:  Lab Results  Component Value Date   CREATININE 1.78 (H) 09/11/2022   BUN 21 09/11/2022   GFR 47.53 (L) 01/09/2022   GFRNONAA 54 (L) 11/12/2020   GFRAA 42 (L) 05/05/2019   NA 142 09/11/2022   K 4.5 09/11/2022   CALCIUM 9.7 09/11/2022   CO2 23 09/11/2022   GLUCOSE 114 (H) 09/11/2022    Lab Results  Component Value Date/Time  HGBA1C 8.3 (H) 09/11/2022 04:02 PM   HGBA1C 8.0 (A) 06/12/2022 07:57 AM   HGBA1C 7.2 (H) 12/23/2021 11:47 AM   GFR 47.53 (L) 01/09/2022 12:05 PM   GFR 60.28 12/23/2021 11:47 AM   MICROALBUR 1.4 09/11/2022 04:02 PM   MICROALBUR 1.5 08/15/2012 09:05 AM    Last diabetic Eye exam:  Lab Results  Component Value Date/Time   HMDIABEYEEXA Retinopathy (A) 05/15/2022 12:00 AM    Last diabetic Foot exam: No results found for: "HMDIABFOOTEX"   Lab Results  Component Value Date   CHOL 103 09/11/2022   HDL 38 (L) 09/11/2022   LDLCALC 44 09/11/2022   TRIG 127  09/11/2022   CHOLHDL 2.7 09/11/2022       Latest Ref Rng & Units 09/11/2022    4:02 PM 06/09/2021    9:23 AM 01/27/2021   12:40 PM  Hepatic Function  Total Protein 6.1 - 8.1 g/dL 7.0  6.8  7.5   Albumin 3.5 - 5.2 g/dL  4.3  4.8   AST 10 - 35 U/L '22  18  20   '$ ALT 9 - 46 U/L '22  23  21   '$ Alk Phosphatase 39 - 117 U/L  90  78   Total Bilirubin 0.2 - 1.2 mg/dL 0.5  0.5  0.6   Bilirubin, Direct 0.0 - 0.3 mg/dL   0.1     Lab Results  Component Value Date/Time   TSH 1.34 01/27/2021 12:40 PM   TSH 2.22 03/21/2018 12:05 PM       Latest Ref Rng & Units 09/11/2022    4:02 PM 12/23/2021   11:47 AM 06/09/2021    9:23 AM  CBC  WBC 3.8 - 10.8 Thousand/uL 5.8  5.9  7.2   Hemoglobin 13.2 - 17.1 g/dL 13.6  13.4  13.7   Hematocrit 38.5 - 50.0 % 38.6  39.6  40.9   Platelets 140 - 400 Thousand/uL 214  193.0  198.0     Lab Results  Component Value Date/Time   VD25OH 29 (L) 09/11/2022 04:02 PM   VD25OH 23.11 (L) 12/23/2021 11:47 AM   VD25OH 19.37 (L) 03/21/2018 12:05 PM   VITAMINB12 1,316 (H) 09/11/2022 04:02 PM   VITAMINB12 290 12/23/2021 11:47 AM    Clinical ASCVD: Yes  The ASCVD Risk score (Arnett DK, et al., 2019) failed to calculate for the following reasons:   The patient has a prior MI or stroke diagnosis       09/22/2022    9:16 AM 12/23/2021   11:03 AM 09/20/2020    2:06 PM  Depression screen PHQ 2/9  Decreased Interest 0 0 0  Down, Depressed, Hopeless 0 0 0  PHQ - 2 Score 0 0 0  Altered sleeping   0  Tired, decreased energy   0  Change in appetite   0  Feeling bad or failure about yourself    0  Trouble concentrating   0  Moving slowly or fidgety/restless   0  Suicidal thoughts   0  PHQ-9 Score   0  Difficult doing work/chores   Not difficult at all    Social History   Tobacco Use  Smoking Status Never  Smokeless Tobacco Former   Types: Chew   Quit date: 02/06/2016   BP Readings from Last 3 Encounters:  09/11/22 104/60  08/13/22 118/62  08/04/22 122/68   Pulse  Readings from Last 3 Encounters:  09/11/22 67  08/13/22 68  08/04/22 70   Wt Readings from  Last 3 Encounters:  09/22/22 215 lb (97.5 kg)  09/11/22 215 lb (97.5 kg)  08/13/22 222 lb (100.7 kg)   BMI Readings from Last 3 Encounters:  09/22/22 30.85 kg/m  09/11/22 30.85 kg/m  08/13/22 31.85 kg/m    Allergies  Allergen Reactions   Ambien [Zolpidem] Other (See Comments)    Parasomnias, sleep walking   Lisinopril Cough   Tramadol Diarrhea    Medications Reviewed Today     Reviewed by Charlton Haws, [facility] (Pharmacist) on 09/24/22 at 60  Med List Status: <None>   Medication Order Taking? Sig Documenting Provider Last Dose Status Informant  amLODipine (NORVASC) 5 MG tablet [id number] Yes Take 1 tablet (5 mg total) by mouth every morning. Cantwell, Celeste C, PA-C Taking Active   aspirin 81 MG chewable tablet [id number] Yes Chew 81 mg by mouth at bedtime. [provider] Taking Active Self  atorvastatin (LIPITOR) 40 MG tablet [id number] Yes TAKE ONE TABLET BY MOUTH EVERY MORNING Tonia Ghent, MD Taking Active   Blood Glucose Monitoring Suppl [facility] VERIO) w/Device KIT [id number] Yes Use to check blood sugars TID. Dx: E11.9 Tonia Ghent, MD Taking Active   carvedilol (COREG) 6.25 MG tablet [id number] Yes TAKE ONE TABLET BY MOUTH TWICE DAILY Adrian Prows, MD Taking Active   Cholecalciferol (VITAMIN D3) 50 MCG (2000 UT) capsule [id number] Yes Take 1 capsule (2,000 Units total) by mouth daily. Tonia Ghent, MD Taking Active   Continuous Blood Gluc Sensor (FREESTYLE LIBRE 3 SENSOR) [place] [id number] Yes by Does not apply route. [provider] Taking Active   cyclobenzaprine (FLEXERIL) 5 MG tablet [id number] Yes Take 1 tablet (5 mg total) by mouth 3 (three) times daily as needed for muscle spasms (sedation caution). Tonia Ghent, MD Taking Active   diclofenac Sodium (VOLTAREN) 1 % GEL [id number] Yes Apply 2-4 g topically 4 (four) times daily. Tonia Ghent,  MD Taking Active   famotidine (PEPCID) 20 MG tablet [id number] Yes TAKE ONE TABLET BY MOUTH EVERYDAY AT BEDTIME Parrett, Tammy S, NP Taking Active   gabapentin (NEURONTIN) 300 MG capsule [id number] Yes 1-2 in the PM, sedation caution. Tonia Ghent, MD Taking Active   glimepiride (AMARYL) 4 MG tablet [id number] Yes TAKE ONE TABLET BY MOUTH TWICE DAILY Tonia Ghent, MD Taking Active   guaiFENesin-codeine 100-10 MG/5ML syrup [id number] Yes Take 5 mLs by mouth every 6 (six) hours as needed for cough. Tonia Ghent, MD Taking Active   insulin degludec [facility]) 100 UNIT/ML FlexTouch Pen [id number] Yes Inject 20 Units into the skin daily. Then increase 1 unit daily until fasting BG <150  Patient taking differently: Inject 22 Units into the skin daily. Then increase 1 unit daily until fasting BG <150   Tonia Ghent, MD Taking Active   isosorbide mononitrate (IMDUR) 30 MG 24 hr tablet [id number] Yes TAKE ONE TABLET BY MOUTH EVERYDAY AT BEDTIME Adrian Prows, MD Taking Active   losartan (COZAAR) 100 MG tablet [id number] Yes TAKE ONE TABLET BY MOUTH EVERYDAY AT BEDTIME Adrian Prows, MD Taking Active   metFORMIN (GLUCOPHAGE) 500 MG tablet [id number] Yes TAKE ONE TABLET BY MOUTH TWICE DAILY Tonia Ghent, MD Taking Active   nitroGLYCERIN (NITROSTAT) 0.4 MG SL tablet [id number] Yes Place 1 tablet (0.4 mg total) under the tongue every 5 (five) minutes as needed for chest pain. Miquel Dunn, NP Taking Active   ondansetron [facility]) 4 MG tablet [id number] Yes Take 4 mg by mouth every 8 (eight) hours  as needed. [provider] Taking Active   oxyCODONE-acetaminophen (PERCOCET/ROXICET) 5-325 MG tablet [id number] Yes Take 1 tablet by mouth 4 (four) times daily. [provider] Taking Active   pantoprazole (PROTONIX) 40 MG tablet [id number] Yes TAKE ONE TABLET BY MOUTH EVERY MORNING Tonia Ghent, MD Taking Active   Semaglutide,0.25 or 0.'5MG'$ /DOS, (OZEMPIC, 0.25 OR 0.5 MG/DOSE,) 2  MG/3ML SOPN [id number] Yes Inject 0.5 mg into the skin once a week. Tonia Ghent, MD Taking Active   tamsulosin [facility]) 0.4 MG CAPS capsule [id number] Yes TAKE ONE CAPSULE BY MOUTH EVERY MORNING and TAKE ONE CAPSULE BY MOUTH EVERYDAY AT BEDTIME Tonia Ghent, MD Taking Active             Patient Active Problem List   Diagnosis Date Noted   URI (upper respiratory infection) 08/16/2022   Concussion 08/05/2022   Acute cough 02/05/2022   Near syncope 12/25/2021   COVID-19 11/14/2021   Advance care planning 06/13/2021   Insomnia 06/13/2021   B12 deficiency 01/30/2021   Fatigue 01/29/2021   Abdominal discomfort 01/05/2021   SOB (shortness of breath) 11/12/2020   Sarcoidosis 11/12/2020   H/O colonoscopy 10/27/2020   Nocturia 05/03/2020   Right sided weakness 03/06/2020   Low back pain 01/29/2020   Facial weakness 11/16/2019   Hematuria 03/22/2018   Renal stones 03/22/2018   Vitamin D deficiency, unspecified 10/04/2017   Chronic neck pain (Primary Area of Pain) (B) (L>R) 09/21/2017   Chronic upper extremity pain (Secondary Area of Pain) (Bilateral) (L>R) 09/21/2017   Chronic left shoulder pain (Tertiary Area of Pain) 09/21/2017   Chronic low back pain (Fourth Area of Pain) (B) (R>L) 09/21/2017   Right leg pain 09/21/2017   Other long term (current) drug therapy 09/21/2017   Other specified health status 09/21/2017   Other chronic pain 08/22/2017   Other fatigue 05/10/2017   Radiculopathy 04/07/2017   Angina pectoris ([place]) 10/15/2016   Cervical radiculitis 07/23/2016   Dysphagia 06/12/2016   Sciatica 02/06/2016   Plantar fasciitis 01/16/2016   Right sided sciatica 05/16/2015   Hand pain 04/05/2015   Premature ejaculation 09/17/2014   Heel pain 09/17/2014   NSTEMI (non-ST elevated myocardial infarction) ([place]) 06/13/2014   Atypical chest pain 06/06/2014   Memory change 05/24/2014   Benign paroxysmal positional vertigo 07/13/2013   Orthostasis 07/13/2013   Migraine,  unspecified, without mention of intractable migraine without mention of status migrainosus 04/03/2013   Pain in joint, ankle and foot 10/19/2012   Irritable mood 01/18/2012   Neck pain 12/03/2010   Hearing loss 12/03/2010   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[place]) 10/20/2010   HYPERCHOLESTEROLEMIA 10/20/2010   DEPRESSION 10/20/2010   Osteoarthritis 10/20/2010   ARTHRITIS 10/20/2010   NEPHROLITHIASIS, HX OF 10/20/2010   ESSENTIAL HYPERTENSION, BENIGN 09/30/2010   CORONARY ATHEROSCLEROSIS NATIVE CORONARY ARTERY 09/30/2010   Sleep apnea 09/30/2010   CHEST PAIN UNSPECIFIED 09/30/2010   PERCUTANEOUS TRANSLUMINAL CORONARY ANGIOPLASTY, HX OF 09/30/2010    Immunization History  Administered Date(s) Administered   Fluad Quad(high Dose 65+) 06/12/2022   Influenza Inj Mdck Quad Pf 06/02/2018   Influenza Whole 09/17/2010   Influenza,inj,Quad PF,6+ Mos 06/08/2013, 05/22/2014, 05/12/2016, 05/08/2019, 05/27/2020   Influenza-Unspecified 06/08/2013, 05/22/2014, 06/08/2015, 05/12/2016, 05/21/2017, 06/02/2018, 05/09/2019, 05/21/2021   PFIZER(Purple Top)SARS-COV-2 Vaccination 11/27/2019, 12/25/2019, 08/07/2020, 05/21/2021   Pneumococcal Polysaccharide-23 09/08/2007, 02/16/2013   Td 09/07/2009   Td (Adult), 2 Lf Tetanus Toxid, Preservative Free 09/07/2009   Zoster Recombinat (Shingrix) 05/29/2022     Compliance/Adherence/Medication fill history: Care Gaps:  UACR Colonoscopy (due 03/2018) AWV (due 06/09/22)   Star-Rating Drugs: Atorvastatin - PDC 100% Glimepiride - PDC 100% Metformin - PDC 100% Losartan - PDC 100%  SDOH:  (Social Determinants of Health) assessments and interventions performed: No SDOH Interventions    Flowsheet Row Clinical Support from 09/22/2022 in [place] at [place] Management from 11/21/2021 in [place] at [place] Management from 12/20/2020 in [place] at  [place] Management from 11/27/2020 in [place] at [place] from 09/20/2020 in [place] at [place] Interventions Intervention Not Indicated Intervention Not Indicated -- -- --  Housing Interventions Intervention Not Indicated -- -- -- --  Transportation Interventions Intervention Not Indicated -- -- -- --  Utilities Interventions Intervention Not Indicated -- -- -- --  Depression Interventions/Treatment  -- -- -- -- PHQ2-9 Score <4 Follow-up Not Indicated  Financial Strain Interventions Intervention Not Indicated Other (Comment)  [Renew Novo Cares PAP] Other (Comment)  [Ozempic patient assistance approved] --  [Application for med assistance in process] --  Physical Activity Interventions Intervention Not Indicated -- -- -- --  Stress Interventions Intervention Not Indicated -- -- -- --  Social Connections Interventions Intervention Not Indicated -- -- -- --      SDOH Screenings   Food Insecurity: No Food Insecurity (09/22/2022)  Housing: Low Risk  (09/22/2022)  Transportation Needs: No Transportation Needs (09/22/2022)  Utilities: Not At Risk (09/22/2022)  Alcohol Screen: Low Risk  (09/20/2020)  Depression (PHQ2-9): Low Risk  (09/22/2022)  Financial Resource Strain: Low Risk  (09/22/2022)  Physical Activity: Inactive (09/22/2022)  Social Connections: Moderately Isolated (09/22/2022)  Stress: No Stress Concern Present (09/22/2022)  Tobacco Use: Medium Risk (09/22/2022)  .  Medication Assistance:  Lawyer pending ([phone])  Medication Access: Within the past 30 days, how often has patient missed a dose of medication? 0 Is a pillbox or other method used to improve adherence? Yes  Factors that may affect medication adherence? financial need Are meds synced by current pharmacy? Yes  Are meds delivered by current pharmacy? Yes  Does patient  experience delays in picking up medications due to transportation concerns? No   [facility] Reviewed: Is patient disadvantaged to use [facility]?: No  Current Rx insurance plan: [employer] Name and location of Current pharmacy:  [facility] - [place], [place] - [address] [place] [address] Phone: [phone] Fax: [phone]  [facility] services reviewed with patient today?: Yes  Packs (last delivery 10/02/22 x 30 ds) Amlodipine '10mg'$ - 1 tablet daily (1 breakfast) Aspirin '81mg'$ - 1 tablet daily (breakfast) Metformin '500mg'$  1 tablet twice daily (1 tablet  breakfast, 1 tablet evening meal) Carvedilol 6.'25mg'$ - 1 tablet twice daily (1 breakfast, 1 evening meal) Isosorbide '30mg'$ - 1 tablet daily (1 bedtime) Tamsulosin 0.'4mg'$  2 capsule daily (1 breakfast, 1 bedtime)   Losartan 100 mg- 1 tablet daily- (1 bedtime) Atorvastatin '40mg'$ - 1 tablet daily (1 breakfast) Glimepiride '4mg'$ - 1 tablet twice daily (1 breakfast, 1 evening meal) Pantoprazole 40 mg- 1 tablet daily (1 breakfast)  Vial medications: Gabapentin 300 mg - 1 - 2 in the PM, sedation caution TRESIBA FLEXTOUCH 100 UNIT/ML  OZEMPIC 0.5 MG/DOSE  Assessment/Plan  Diabetes (A1c goal <7%) -Not ideally controlled - A1c 8.0% (06/2022); Pt restarted Ozempic Sun 1/14 with Tyler Aas  20 units daily (transitioned from [place]); He is up to 22 units of [place] today, which he takes in the morning (6-7am) Reviewed AGP report: 09/29/22 to 10/12/22. Sensor active: 97%  Time in range (70-180): 63% (goal > 70%)  High (180-250): 33%  Very high (>250): 4%  Low (< 70): 0% (goal < 4%)  GMI: 7.2%; Average glucose: 161  -Previous AGP report: 09/11/22 to 09/24/22. Sensor active: 85%  Time in range (70-180): 33% (goal > 70%)  High (180-250): 40%  Very high (>250): 24%  Low (< 70): 3% (goal < 4%)  GMI: 8.1%; Average glucose: 202  -Current medications: Glimepiride 4 mg BID -  Appropriate, Query Effective, Metformin 500 mg BID -Appropriate, Query Effective, Tresiba 22 units daily AM -Appropriate, Query Effective, Ozempic 0.5 mg weekly Sun - Appropriate, Query Effective, Freestyle Libre 3 ([place]) -Medications previously tried: Novolin N 20 u BID, Soliqua (not as effective), Fiasp -Reviewed AGP report, pt is dropping low 1-2pm nearly every day after peaking high before noon; pt denies activity or medication around this time that would explain sudden drop; -Advised pt to eat carbs with lunch (~12pm) to try to avoid afternoon lows -Recommend to hold AM dose of glimepiride to try to prevent midday hypoglycemia; advised to wait 2-3 days between insulin adjustments to avoid titrating too quickly   Hypertension / CKD Stage 3b (BP goal <130/80) -Controlled - BP at goal in office  -Current treatment: Amodipine 5 mg daily - Appropriate, Effective, Safe, Accessible Carvedilol 6.25 mg BID -Appropriate, Effective, Safe, Accessible Isosorbide MN 30 mg HS -Appropriate, Effective, Safe, Accessible Losartan 100 mg daily -Appropriate, Effective, Safe, Accessible -Medications previously tried: n/a  -Educated on BP goals and benefits of medications for prevention of heart attack, stroke and kidney damage; -Counseled to monitor BP at home periodically, document, and provide log at future appointments -Recommended to continue current medication   Hyperlipidemia / CAD (LDL goal < 70) -Controlled - LDL 59 -Hx CAD. NSTEMI 06/2014 -Current treatment: Atorvastatin 40 mg daily -Appropriate, Effective, Safe, Accessible Aspirin 81 mg daily -Appropriate, Effective, Safe, Accessible Isosorbide [place] 30 mg daily -Appropriate, Effective, Safe, Accessible Nitroglycerin 0.4 mg SL prn -Appropriate, Effective, Safe, Accessible -Medications previously tried: none  -Recommended to continue current medication  Chronic Kidney Disease Stage 3b  -All medications assessed for renal dosing and  appropriateness in chronic kidney disease. -Recommended to continue current medication   Chronic pain (Goal: manage symptoms) -Controlled -cervical radiculitis, sciatica, osteoarthritis. Injections from other did not help, going to pain mgmt. -Current treatment  Oxycodone-APAP 5-325 mg - Appropriate, Effective, Safe, Accessible Cyclobenzaprine 5 mg TID prn -Appropriate, Effective, Safe, Accessible Gabapentin 300 mg -Appropriate, Effective, Safe, Accessible -Medications previously tried: n/a  -Recommended to continue current medication   Health Maintenance -Vaccine gaps: Shingrix       Charlene Brooke, PharmD, BCACP Clinical Pharmacist [facility] at [facility] [phone]

## 2022-10-12 NOTE — Telephone Encounter (Signed)
Care Management & Coordination Services Outreach Note  10/12/2022 Name: Tony Manning MRN: [id number] DOB: July 18, 1961  Referred by: Tonia Ghent, MD  Patient had a phone appointment scheduled with clinical pharmacist today.  An unsuccessful telephone outreach was attempted today. The patient was referred to the pharmacist for assistance with medications, care management and care coordination.   Patient will NOT be penalized in any way for missing a Care Management & Coordination Services appointment. The no-show fee does not apply.  If possible, a message was left to return call to: [phone] or to [facility].  Charlene Brooke, PharmD, BCACP Clinical Pharmacist [facility] [phone]

## 2022-10-14 ENCOUNTER — Telehealth: Payer: Self-pay

## 2022-10-14 NOTE — Telephone Encounter (Signed)
Patient called states he was returning call from [place] about his Ozempic. Would like call back.

## 2022-10-15 NOTE — Telephone Encounter (Cosign Needed)
Called patient; no answer; left message.  Lindsey Foltanski, PharmD notified  Densil Ottey, RMA Clinical Pharmacy Assistant [phone]   

## 2022-10-20 ENCOUNTER — Ambulatory Visit (INDEPENDENT_AMBULATORY_CARE_PROVIDER_SITE_OTHER): Payer: Medicare HMO | Admitting: Family Medicine

## 2022-10-20 ENCOUNTER — Other Ambulatory Visit: Payer: Self-pay | Admitting: Family Medicine

## 2022-10-20 ENCOUNTER — Encounter: Payer: Self-pay | Admitting: Family Medicine

## 2022-10-20 VITALS — BP 102/52 | HR 70 | Temp 98.0°F | Ht 70.0 in | Wt 214.0 lb

## 2022-10-20 DIAGNOSIS — S060X0S Concussion without loss of consciousness, sequela: Secondary | ICD-10-CM | POA: Diagnosis not present

## 2022-10-20 DIAGNOSIS — Z794 Long term (current) use of insulin: Secondary | ICD-10-CM

## 2022-10-20 DIAGNOSIS — E1149 Type 2 diabetes mellitus with other diabetic neurological complication: Secondary | ICD-10-CM | POA: Diagnosis not present

## 2022-10-20 MED ORDER — GLIMEPIRIDE 4 MG PO TABS: 4.0000 mg | ORAL_TABLET | Freq: Every day | ORAL | Status: DC

## 2022-10-20 MED ORDER — TRESIBA FLEXTOUCH 100 UNIT/ML ~~LOC~~ SOPN: 20.0000 [IU] | PEN_INJECTOR | Freq: Every day | SUBCUTANEOUS | Status: DC

## 2022-10-20 MED ORDER — VITAMIN B-12 1000 MCG PO TABS: 1000.0000 ug | ORAL_TABLET | Freq: Every day | ORAL | Status: DC

## 2022-10-20 MED ORDER — VITAMIN D3 50 MCG (2000 UT) PO CAPS: 4000.0000 [IU] | ORAL_CAPSULE | Freq: Every day | ORAL | Status: DC

## 2022-10-20 NOTE — Telephone Encounter (Cosign Needed)
Called patient; no answer; left message. Second unsuccessful attempt.   Charlene Brooke, PharmD notified  Marijean Niemann, [place] Clinical Pharmacy Assistant [phone]

## 2022-10-20 NOTE — Patient Instructions (Signed)
Let me check on options in the meantime and we'll be in touch.  Take care.  Glad to see you.

## 2022-10-20 NOTE — Progress Notes (Unsigned)
He isn't lightheaded.    Prev concussion hx d/w pt.  Lost balance yesterday with his dog and fell into a door.  No LOC.  HA after that, better today.  L frontal HA. No bruising.  No new vision changes.   Sugar 146 at OV.  152 average for the last 7 days.  162 average for the last month.    He isn't going to be able to get semaglutide due to cost after April with current dosing.  He was asking about getting 4m dosing and using 1/2 dose.    He felt better taking vitamin B12, more fatigued off med.  Discussed continuing.    Current Outpatient Medications on File Prior to Visit  Medication Sig Dispense Refill   amLODipine (NORVASC) 5 MG tablet Take 1 tablet (5 mg total) by mouth every morning. 90 tablet 3   aspirin 81 MG chewable tablet Chew 81 mg by mouth at bedtime.     atorvastatin (LIPITOR) 40 MG tablet TAKE ONE TABLET BY MOUTH EVERY MORNING 90 tablet 2   Blood Glucose Monitoring Suppl (ONETOUCH VERIO) w/Device KIT Use to check blood sugars TID. Dx: E11.9 1 kit 0   carvedilol (COREG) 6.25 MG tablet TAKE ONE TABLET BY MOUTH TWICE DAILY 180 tablet 2   Continuous Blood Gluc Sensor (FREESTYLE LIBRE 3 SENSOR) MISC by Does not apply route.     cyclobenzaprine (FLEXERIL) 5 MG tablet Take 1 tablet (5 mg total) by mouth 3 (three) times daily as needed for muscle spasms (sedation caution). 60 tablet 2   diclofenac Sodium (VOLTAREN) 1 % GEL Apply 2-4 g topically 4 (four) times daily. 100 g 5   famotidine (PEPCID) 20 MG tablet TAKE ONE TABLET BY MOUTH EVERYDAY AT BEDTIME 90 tablet 0   gabapentin (NEURONTIN) 300 MG capsule 1-2 in the PM, sedation caution.     isosorbide mononitrate (IMDUR) 30 MG 24 hr tablet TAKE ONE TABLET BY MOUTH EVERYDAY AT BEDTIME 90 tablet 2   losartan (COZAAR) 100 MG tablet TAKE ONE TABLET BY MOUTH EVERYDAY AT BEDTIME 90 tablet 1   metFORMIN (GLUCOPHAGE) 500 MG tablet TAKE ONE TABLET BY MOUTH TWICE DAILY 180 tablet 2   nitroGLYCERIN (NITROSTAT) 0.4 MG SL tablet Place 1 tablet  (0.4 mg total) under the tongue every 5 (five) minutes as needed for chest pain. 25 tablet 3   ondansetron (ZOFRAN) 4 MG tablet Take 4 mg by mouth every 8 (eight) hours as needed.     oxyCODONE-acetaminophen (PERCOCET/ROXICET) 5-325 MG tablet Take 1 tablet by mouth 4 (four) times daily.     pantoprazole (PROTONIX) 40 MG tablet TAKE ONE TABLET BY MOUTH EVERY MORNING 90 tablet 1   Semaglutide,0.25 or 0.5MG/DOS, (OZEMPIC, 0.25 OR 0.5 MG/DOSE,) 2 MG/3ML SOPN Inject 0.5 mg into the skin once a week. 3 mL 5   tamsulosin (FLOMAX) 0.4 MG CAPS capsule TAKE ONE CAPSULE BY MOUTH EVERY MORNING and TAKE ONE CAPSULE BY MOUTH EVERYDAY AT BEDTIME 60 capsule 2   No current facility-administered medications on file prior to visit.     Meds, vitals, and allergies reviewed.   ROS: Per HPI unless specifically indicated in ROS section

## 2022-10-21 ENCOUNTER — Telehealth: Payer: Self-pay | Admitting: Family Medicine

## 2022-10-21 ENCOUNTER — Telehealth: Payer: Self-pay

## 2022-10-21 NOTE — Progress Notes (Signed)
Care Management & Coordination Services Pharmacy Team  Reason for Encounter: Medication coordination and delivery  Contacted patient to discuss medications and coordinate delivery from [facility].  Unsuccessful outreach. Left voicemail for patient to return call.  Cycle dispensing form sent to [facility] for review.   Last adherence delivery date: 10/02/2022      Patient is due for next adherence delivery on: 11/03/2022  This delivery to include: Adherence Packaging  30 Days  Packs: Amlodipine '10mg'$ - 1 tablet daily (1 breakfast) Aspirin '81mg'$ - 1 tablet daily (breakfast) Metformin '500mg'$  1 tablet twice daily (1 tablet  breakfast, 1 tablet evening meal) Carvedilol 6.'25mg'$ - 1 tablet twice daily (1 breakfast, 1 evening meal) Isosorbide '30mg'$ - 1 tablet daily (1 bedtime) Tamsulosin 0.'4mg'$  2 capsule daily (1 breakfast, 1 bedtime)   Losartan 100 mg- 1 tablet daily- (1 bedtime) Atorvastatin '40mg'$ - 1 tablet daily (1 breakfast) Glimepiride '4mg'$ - 1 tablet daily (1 breakfast) Pantoprazole 40 mg- 1 tablet daily (1 breakfast)   Vial medications: Gabapentin 300 mg - 1 - 2 in the PM, sedation caution Cyclobenzaprine 5 mg - 1 tablet 3x daily PRN    Receives from Manufacturer: OZEMPIC 0.5 MG/DOSE   No refill request needed.  Delivery scheduled for 11/03/2022. Unable to speak with patient to confirm date.    Recent blood pressure readings are as follows: Patient does not take blood pressure at home.    Recent blood glucose readings are as follows:  Charlene Brooke has access to patient's meter readings.    Chart review: Recent office visits:  10/20/22 [name], MD DM Start: Vit B12 1,000 mcg daily Change: Cholecalciferol 4,000 units daily Change: Glimepiride 4 mg daily Change: Insulin Degludec 20 Units daily Stop (completed): guaiFENesin-codeine 100-10 MG/5ML syrup   Recent consult visits:  None since last contact  Hospital visits:  None in previous 6  months  Medications: Outpatient Encounter Medications as of 10/21/2022  Medication Sig   amLODipine (NORVASC) 5 MG tablet Take 1 tablet (5 mg total) by mouth every morning.   aspirin 81 MG chewable tablet Chew 81 mg by mouth at bedtime.   atorvastatin (LIPITOR) 40 MG tablet TAKE ONE TABLET BY MOUTH EVERY MORNING   Blood Glucose Monitoring Suppl (ONETOUCH VERIO) w/Device KIT Use to check blood sugars TID. Dx: E11.9   carvedilol (COREG) 6.25 MG tablet TAKE ONE TABLET BY MOUTH TWICE DAILY   Cholecalciferol (VITAMIN D3) 50 MCG (2000 UT) CAPS Take 2 capsules (4,000 Units total) by mouth daily.   Continuous Blood Gluc Sensor (FREESTYLE LIBRE 3 SENSOR) MISC by Does not apply route.   cyanocobalamin (VITAMIN B12) 1000 MCG tablet Take 1 tablet (1,000 mcg total) by mouth daily.   cyclobenzaprine (FLEXERIL) 5 MG tablet Take 1 tablet (5 mg total) by mouth 3 (three) times daily as needed for muscle spasms (sedation caution).   diclofenac Sodium (VOLTAREN) 1 % GEL Apply 2-4 g topically 4 (four) times daily.   famotidine (PEPCID) 20 MG tablet TAKE ONE TABLET BY MOUTH EVERYDAY AT BEDTIME   gabapentin (NEURONTIN) 300 MG capsule 1-2 in the PM, sedation caution.   glimepiride (AMARYL) 4 MG tablet Take 1 tablet (4 mg total) by mouth daily with breakfast.   insulin degludec (TRESIBA FLEXTOUCH) 100 UNIT/ML FlexTouch Pen Inject 20 Units into the skin daily.   isosorbide mononitrate (IMDUR) 30 MG 24 hr tablet TAKE ONE TABLET BY MOUTH EVERYDAY AT BEDTIME   losartan (COZAAR) 100 MG tablet TAKE ONE TABLET BY MOUTH EVERYDAY AT BEDTIME   metFORMIN (GLUCOPHAGE)  500 MG tablet TAKE ONE TABLET BY MOUTH TWICE DAILY   nitroGLYCERIN (NITROSTAT) 0.4 MG SL tablet Place 1 tablet (0.4 mg total) under the tongue every 5 (five) minutes as needed for chest pain.   ondansetron (ZOFRAN) 4 MG tablet Take 4 mg by mouth every 8 (eight) hours as needed.   oxyCODONE-acetaminophen (PERCOCET/ROXICET) 5-325 MG tablet Take 1 tablet by mouth 4  (four) times daily.   pantoprazole (PROTONIX) 40 MG tablet TAKE ONE TABLET BY MOUTH EVERY MORNING   Semaglutide,0.25 or 0.'5MG'$ /DOS, (OZEMPIC, 0.25 OR 0.5 MG/DOSE,) 2 MG/3ML SOPN Inject 0.5 mg into the skin once a week.   tamsulosin (FLOMAX) 0.4 MG CAPS capsule TAKE ONE CAPSULE BY MOUTH EVERY MORNING and TAKE ONE CAPSULE BY MOUTH EVERYDAY AT BEDTIME   No facility-administered encounter medications on file as of 10/21/2022.   BP Readings from Last 3 Encounters:  10/20/22 (!) 102/52  09/11/22 104/60  08/13/22 118/62    Pulse Readings from Last 3 Encounters:  10/20/22 70  09/11/22 67  08/13/22 68    Lab Results  Component Value Date/Time   HGBA1C 8.3 (H) 09/11/2022 04:02 PM   HGBA1C 8.0 (A) 06/12/2022 07:57 AM   HGBA1C 7.2 (H) 12/23/2021 11:47 AM   Lab Results  Component Value Date   CREATININE 1.78 (H) 09/11/2022   BUN 21 09/11/2022   GFR 47.53 (L) 01/09/2022   GFRNONAA 54 (L) 11/12/2020   GFRAA 42 (L) 05/05/2019   NA 142 09/11/2022   K 4.5 09/11/2022   CALCIUM 9.7 09/11/2022   CO2 23 09/11/2022   Charlene Brooke, PharmD notified  Marijean Niemann, [place] Pharmacy Assistant [phone]

## 2022-10-21 NOTE — Assessment & Plan Note (Signed)
He isn't going to be able to get semaglutide due to cost after April with current dosing.  He was asking about getting 43m dosing and using 1/2 dose.  I told him I will check on options.  See following phone note.

## 2022-10-21 NOTE — Progress Notes (Signed)
  Chronic Care Management   Note  10/21/2022 Name: Tony Manning MRN: [id number] DOB: Feb 13, 1961  Tony Manning is a 62 y.o. year old male who is a primary care patient of Tonia Ghent, MD. I reached out to [name] by phone today in response to a referral sent by Tony Manning's PCP.  The first contact attempt was unsuccessful.   Follow up plan: Additional outreach attempts will be made.  Noreene Larsson, [place], [place] [address] Direct Dial: [phone] [email or web address]

## 2022-10-21 NOTE — Telephone Encounter (Signed)
This patient's home glucose readings are clearly better with semaglutide.  He isn't going to be able to get semaglutide due to cost after April with current dosing.  He was asking about getting 71m dose and using 1/2 dose.  I told him I will check on options.  I did not think it would be possible to split the 1 mg dose.  Do you have any options or ideas here?  Many thanks.

## 2022-10-21 NOTE — Assessment & Plan Note (Signed)
No change in plan at this point.  Routine cautions given to patient.  He will update me as needed.

## 2022-10-22 NOTE — Telephone Encounter (Signed)
We are working on PAP for Ozempic - we submitted application and have not heard anything back. Amy - please reach out to [facility] for update.

## 2022-10-23 ENCOUNTER — Telehealth: Payer: Self-pay

## 2022-10-23 NOTE — Progress Notes (Signed)
error 

## 2022-10-28 DIAGNOSIS — Z794 Long term (current) use of insulin: Secondary | ICD-10-CM | POA: Diagnosis not present

## 2022-10-28 DIAGNOSIS — E1149 Type 2 diabetes mellitus with other diabetic neurological complication: Secondary | ICD-10-CM | POA: Diagnosis not present

## 2022-10-28 NOTE — Telephone Encounter (Cosign Needed)
Left message for [employer] to return my call on 10/29/2022 at 9:00 am due to hold time.  Charlene Brooke, PharmD notified  Marijean Niemann, [place] Clinical Pharmacy Assistant [phone]

## 2022-10-29 NOTE — Telephone Encounter (Signed)
Re-sent [employer] PAP to manufacturer with Ozempic 1 mg dose.

## 2022-11-02 NOTE — Progress Notes (Signed)
  Chronic Care Management   Note  11/02/2022 Name: Tony Manning MRN: [id number] DOB: [birth year]  Tony Manning is a 62 y.o. year old male who is a primary care patient of Tonia Ghent, MD. I reached out to [name] by phone today in response to a referral sent by Tony Manning's PCP.  [name] was given information about Chronic Care Management services today including:  CCM service includes personalized support from designated clinical staff supervised by the physician, including individualized plan of care and coordination with other care providers 24/7 contact phone numbers for assistance for urgent and routine care needs. Service will only be billed when office clinical staff spend 20 minutes or more in a month to coordinate care. Only one practitioner may furnish and bill the service in a calendar month. The patient may stop CCM services at amy time (effective at the end of the month) by phone call to the office staff. The patient will be responsible for cost sharing (co-pay) or up to 20% of the service fee (after annual deductible is met)  [name] [facility]  agreedto scheduling an appointment with the CCM RN Case Manager   Follow up plan: Patient agreed to scheduled appointment with RN Case Manager on 11/04/2022(date/time).   Noreene Larsson, [place], [place] [address] Direct Dial: [phone] [email or web address]

## 2022-11-03 NOTE — Telephone Encounter (Signed)
Ozempic PAP has been approved, pt will have Ozempic shipped to PCP office free of charge through 09/07/23.

## 2022-11-03 NOTE — Telephone Encounter (Signed)
Noted. Thanks.  Please update patient.

## 2022-11-03 NOTE — Telephone Encounter (Signed)
Patient has been notified of approval and will await our call to pick up med.

## 2022-11-04 ENCOUNTER — Ambulatory Visit (INDEPENDENT_AMBULATORY_CARE_PROVIDER_SITE_OTHER): Payer: Medicare HMO | Admitting: *Deleted

## 2022-11-04 ENCOUNTER — Telehealth: Payer: Self-pay | Admitting: *Deleted

## 2022-11-04 DIAGNOSIS — I10 Essential (primary) hypertension: Secondary | ICD-10-CM

## 2022-11-04 DIAGNOSIS — E1149 Type 2 diabetes mellitus with other diabetic neurological complication: Secondary | ICD-10-CM

## 2022-11-04 NOTE — Patient Instructions (Signed)
Please call the care guide team at [phone] if you need to cancel or reschedule your appointment.   If you are experiencing a Mental Health or [place] or need someone to talk to, please call the Suicide and Crisis Lifeline: 988 call the [place] National Suicide Prevention Lifeline: 747-403-1540 or TTY: 731-682-2459 TTY (718)341-5647) to talk to a trained counselor call 1-800-273-TALK (toll free, 24 hour hotline) go to [facility] [address] [place] [phone]) call 911   Following is a copy of the CCM Program Consent:  CCM service includes personalized support from designated clinical staff supervised by the physician, including individualized plan of care and coordination with other care providers 24/7 contact phone numbers for assistance for urgent and routine care needs. Service will only be billed when office clinical staff spend 20 minutes or more in a month to coordinate care. Only one practitioner may furnish and bill the service in a calendar month. The patient may stop CCM services at amy time (effective at the end of the month) by phone call to the office staff. The patient will be responsible for cost sharing (co-pay) or up to 20% of the service fee (after annual deductible is met)  Following is a copy of your full provider care plan:   Goals Addressed             This Visit's Progress    CCM (DIABETES) EXPECTED OUTCOME: MONITOR, SELF- MANAGE AND REDUCE SYMPTOMS OF DIABETES       Current Barriers:  Knowledge Deficits related to Diabetes management Chronic Disease Management support and education needs related to Diabetes, diet Patient reports CBG monitored with [facility], states fasting CBG today 170, random reading last hs 232, reports 7 day average is 177 and 14 day average is 176. Patient reports he will be getting assistance for ozempic, pharmacist to outreach pt on 11/05/22.  Planned  Interventions: Provided education to patient about basic DM disease process; Reviewed medications with patient and discussed importance of medication adherence;        Reviewed prescribed diet with patient carbohydrate modified; Discussed plans with patient for ongoing care management follow up and provided patient with direct contact information for care management team;      Provided patient with written educational materials related to hypo and hyperglycemia and importance of correct treatment;       Advised patient, providing education and rationale, to check cbg per [facility] [place]  and record        call provider for findings outside established parameters;       Review of patient status, including review of consultants reports, relevant laboratory and other test results, and medications completed;       Screening for signs and symptoms of depression related to chronic disease state;        Assessed social determinant of health barriers;         Symptom Management: Take medications as prescribed   Attend all scheduled provider appointments Call pharmacy for medication refills 3-7 days in advance of running out of medications Attend church or other social activities Perform all self care activities independently  Perform IADL's (shopping, preparing meals, housekeeping, managing finances) independently Call provider office for new concerns or questions  check blood sugar at prescribed times: per [facility]  check feet daily for cuts, sores or redness trim toenails straight across fill half of plate with vegetables limit fast food meals to no more than 1 per  week manage portion size prepare main meal at home 3 to 5 days each week read food labels for fat, fiber, carbohydrates and portion size Look over education sent via my chart- hypoglycemia Eat 3 meals per day Pharmacist to call you on 11/05/22 at 9 am  Follow Up Plan: Telephone follow up appointment with care management  team member scheduled for: 01/21/23 at 130 pm       CCM (HYPERTENSION) EXPECTED OUTCOME: MONITOR, SELF-MANAGE AND REDUCE SYMPTOMS OF HYPERTENSION       Current Barriers:  Knowledge Deficits related to Hypertension management Chronic Disease Management support and education needs related to Hypertension, diet Patient reports he lives with spouse, is independent with all aspects of his care, works part time, has all medications and taking as prescribed Patient reports he has blood pressure cuff but does not monitor blood pressure Patient reports he has had several falls this past year, one of the falls resulted in a concussion (tripped over something at work)  Planned Interventions: Evaluation of current treatment plan related to hypertension self management and patient's adherence to plan as established by provider;   Provided education to patient re: stroke prevention, s/s of heart attack and stroke; Reviewed prescribed diet low sodium Reviewed medications with patient and discussed importance of compliance;  Counseled on the importance of exercise goals with target of 150 minutes per week Discussed plans with patient for ongoing care management follow up and provided patient with direct contact information for care management team; Advised patient, providing education and rationale, to monitor blood pressure daily and record, calling PCP for findings outside established parameters;  Provided education on prescribed diet low sodium;  Discussed complications of poorly controlled blood pressure such as heart disease, stroke, circulatory complications, vision complications, kidney impairment, sexual dysfunction;  Screening for signs and symptoms of depression related to chronic disease state;  Assessed social determinant of health barriers;  Reviewed safety precautions  Symptom Management: Take medications as prescribed   Attend all scheduled provider appointments Call pharmacy for medication  refills 3-7 days in advance of running out of medications Attend church or other social activities Perform all self care activities independently  Perform IADL's (shopping, preparing meals, housekeeping, managing finances) independently Call provider office for new concerns or questions  check blood pressure weekly choose a place to take my blood pressure (home, clinic or office, retail store) write blood pressure results in a log or diary learn about high blood pressure take blood pressure log to all doctor appointments call doctor for signs and symptoms of high blood pressure keep all doctor appointments take medications for blood pressure exactly as prescribed begin an exercise program report new symptoms to your doctor eat more whole grains, fruits and vegetables, lean meats and healthy fats Look over education sent via my chart- low sodium diet fall prevention strategies: change position slowly, use assistive device such as walker or cane (per provider recommendations) when walking, keep walkways clear, have good lighting in room. It is important to contact your provider if you have any falls, maintain muscle strength/tone by exercise per provider recommendations.  Follow Up Plan: Telephone follow up appointment with care management team member scheduled for:  01/21/23 at 130 pm          Patient verbalizes understanding of instructions and care plan provided today and agrees to view in [place]. Active MyChart status and patient understanding of how to access instructions and care plan via MyChart confirmed with patient.     Telephone  follow up appointment with care management team member scheduled for:  01/21/23 at 130 pm  Hypoglycemia Hypoglycemia occurs when the level of sugar (glucose) in the blood is too low. Hypoglycemia can happen in people who have or do not have diabetes. It can develop quickly, and it can be a medical emergency. For most people, a blood glucose level below  70 mg/dL (3.9 mmol/L) is considered hypoglycemia. Glucose is a type of sugar that provides the body's main source of energy. Certain hormones (insulin and glucagon) control the level of glucose in the blood. Insulin lowers blood glucose, and glucagon raises blood glucose. Hypoglycemia can result from having too much insulin in the bloodstream, or from not eating enough food that contains glucose. You may also have reactive hypoglycemia, which happens within 4 hours after eating a meal. What are the causes? Hypoglycemia occurs most often in people who have diabetes and may be caused by: Diabetes medicine. Not eating enough, or not eating often enough. Increased physical activity. Drinking alcohol on an empty stomach. If you do not have diabetes, hypoglycemia may be caused by: A tumor in the pancreas. Not eating enough, or not eating for long periods at a time (fasting). A severe infection or illness. Problems after having bariatric surgery. Organ failure, such as kidney or liver failure. Certain medicines. What increases the risk? Hypoglycemia is more likely to develop in people who: Have diabetes and take medicines to lower blood glucose. Abuse alcohol. Have a severe illness. What are the signs or symptoms? Symptoms vary depending on whether the condition is mild, moderate, or severe. Mild hypoglycemia Hunger. Sweating and feeling clammy. Dizziness or feeling light-headed. Sleepiness or restless sleep. Nausea. Increased heart rate. Headache. Blurry vision. Mood changes, such as irritability or anxiety. Tingling or numbness around the mouth, lips, or tongue. Moderate hypoglycemia Confusion and poor judgment. Behavior changes. Weakness. Irregular heartbeat. A change in coordination. Severe hypoglycemia Severe hypoglycemia is a medical emergency. It can cause: Fainting. Seizures. Loss of consciousness (coma). Death. How is this diagnosed? Hypoglycemia is diagnosed with a  blood test to measure your blood glucose level. This blood test is done while you are having symptoms. Your health care provider may also do a physical exam and review your medical history. How is this treated? This condition can be treated by immediately eating or drinking something that contains sugar with 15 grams of fast-acting carbohydrate, such as: 4 oz (120 mL) of fruit juice. 4 oz (120 mL) of regular soda (not diet soda). Several pieces of hard candy. Check food labels to find out how many pieces to eat for 15 grams. 1 Tbsp (15 mL) of sugar or honey. 4 glucose tablets. 1 tube of glucose gel. Treating hypoglycemia if you have diabetes If you are alert and able to swallow safely, follow the 15:15 rule: Take 15 grams of a fast-acting carbohydrate. Talk with your health care provider about how much you should take. Options for getting 15 grams of fast-acting carbohydrate include: Glucose tablets (take 4 tablets). Several pieces of hard candy. Check food labels to find out how many pieces to eat for 15 grams. 4 oz (120 mL) of fruit juice. 4 oz (120 mL) of regular soda (not diet soda). 1 Tbsp (15 mL) of sugar or honey. 1 tube of glucose gel. Check your blood glucose 15 minutes after you take the carbohydrate. If the repeat blood glucose level is still at or below 70 mg/dL (3.9 mmol/L), take 15 grams of a carbohydrate again. If  your blood glucose level does not increase above 70 mg/dL (3.9 mmol/L) after 3 tries, seek emergency medical care. After your blood glucose level returns to normal, eat a meal or a snack within 1 hour.  Treating severe hypoglycemia Severe hypoglycemia is when your blood glucose level is below 54 mg/dL (3 mmol/L). Severe hypoglycemia is a medical emergency. Get medical help right away. If you have severe hypoglycemia and you cannot eat or drink, you will need to be given glucagon. A family member or close friend should learn how to check your blood glucose and how to  give you glucagon. Ask your health care provider if you need to have an emergency glucagon kit available. Severe hypoglycemia may need to be treated in a hospital. The treatment may include getting glucose through an IV. You may also need treatment for the cause of your hypoglycemia. Follow these instructions at home:  General instructions Take over-the-counter and prescription medicines only as told by your health care provider. Monitor your blood glucose as told by your health care provider. If you drink alcohol: Limit how much you have to: 0-1 drink a day for women who are not pregnant. 0-2 drinks a day for men. Know how much alcohol is in your drink. In the [place] one drink equals one 12 oz bottle of beer (355 mL), one 5 oz glass of wine (148 mL), or one 1 oz glass of hard liquor (44 mL). Be sure to eat food along with drinking alcohol. Be aware that alcohol is absorbed quickly and may have lingering effects that may result in hypoglycemia later. Be sure to do ongoing glucose monitoring. Keep all follow-up visits. This is important. If you have diabetes: Always have a fast-acting carbohydrate (15 grams) option with you to treat low blood glucose. Follow your diabetes management plan as directed by your health care provider. Make sure you: Know the symptoms of hypoglycemia. It is important to treat it right away to prevent it from becoming severe. Check your blood glucose as often as told. Always check before and after exercise. Always check your blood glucose before you drive a motorized vehicle. Take your medicines as told. Follow your meal plan. Eat on time, and do not skip meals. Share your diabetes management plan with people in your workplace, school, and household. Carry a medical alert card or wear medical alert jewelry. Where to find more information American Diabetes Association: www.diabetes.org Contact a health care provider if: You have problems keeping your blood glucose  in your target range. You have frequent episodes of hypoglycemia. Get help right away if: You continue to have hypoglycemia symptoms after eating or drinking something that contains 15 grams of fast-acting carbohydrate, and you cannot get your blood glucose above 70 mg/dL (3.9 mmol/L) while following the 15:15 rule. Your blood glucose is below 54 mg/dL (3 mmol/L). You have a seizure. You faint. These symptoms may represent a serious problem that is an emergency. Do not wait to see if the symptoms will go away. Get medical help right away. Call your local emergency services (911 in the [place] Do not drive yourself to the hospital. Summary Hypoglycemia occurs when the level of sugar (glucose) in the blood is too low. Hypoglycemia can happen in people who have or do not have diabetes. It can develop quickly, and it can be a medical emergency. Make sure you know the symptoms of hypoglycemia and how to treat it. Always have a fast-acting carbohydrate option with you to treat low blood  sugar. This information is not intended to replace advice given to you by your health care provider. Make sure you discuss any questions you have with your health care provider. Document Revised: 07/25/2020 Document Reviewed: 07/25/2020 Elsevier Patient Education  [place]. Low-Sodium Eating Plan Sodium, which is an element that makes up salt, helps you maintain a healthy balance of fluids in your body. Too much sodium can increase your blood pressure and cause fluid and waste to be held in your body. Your health care provider or dietitian may recommend following this plan if you have high blood pressure (hypertension), kidney disease, liver disease, or heart failure. Eating less sodium can help lower your blood pressure, reduce swelling, and protect your heart, liver, and kidneys. What are tips for following this plan? Reading food labels The Nutrition Facts label lists the amount of sodium in one serving of  the food. If you eat more than one serving, you must multiply the listed amount of sodium by the number of servings. Choose foods with less than 140 mg of sodium per serving. Avoid foods with 300 mg of sodium or more per serving. Shopping  Look for lower-sodium products, often labeled as "low-sodium" or "no salt added." Always check the sodium content, even if foods are labeled as "unsalted" or "no salt added." Buy fresh foods. Avoid canned foods and pre-made or frozen meals. Avoid canned, cured, or processed meats. Buy breads that have less than 80 mg of sodium per slice. Cooking  Eat more home-cooked food and less restaurant, buffet, and fast food. Avoid adding salt when cooking. Use salt-free seasonings or herbs instead of table salt or sea salt. Check with your health care provider or pharmacist before using salt substitutes. Cook with plant-based oils, such as canola, sunflower, or olive oil. Meal planning When eating at a restaurant, ask that your food be prepared with less salt or no salt, if possible. Avoid dishes labeled as brined, pickled, cured, smoked, or made with soy sauce, miso, or teriyaki sauce. Avoid foods that contain MSG (monosodium glutamate). MSG is sometimes added to [place] food, bouillon, and some canned foods. Make meals that can be grilled, baked, poached, roasted, or steamed. These are generally made with less sodium. General information Most people on this plan should limit their sodium intake to 1,500-2,000 mg (milligrams) of sodium each day. What foods should I eat? Fruits Fresh, frozen, or canned fruit. Fruit juice. Vegetables Fresh or frozen vegetables. "No salt added" canned vegetables. "No salt added" tomato sauce and paste. Low-sodium or reduced-sodium tomato and vegetable juice. Grains Low-sodium cereals, including oats, puffed wheat and rice, and shredded wheat. Low-sodium crackers. Unsalted rice. Unsalted pasta. Low-sodium bread. Whole-grain breads  and whole-grain pasta. Meats and other proteins Fresh or frozen (no salt added) meat, poultry, seafood, and fish. Low-sodium canned tuna and salmon. Unsalted nuts. Dried peas, beans, and lentils without added salt. Unsalted canned beans. Eggs. Unsalted nut butters. Dairy Milk. Soy milk. Cheese that is naturally low in sodium, such as ricotta cheese, fresh mozzarella, or Swiss cheese. Low-sodium or reduced-sodium cheese. Cream cheese. Yogurt. Seasonings and condiments Fresh and dried herbs and spices. Salt-free seasonings. Low-sodium mustard and ketchup. Sodium-free salad dressing. Sodium-free light mayonnaise. Fresh or refrigerated horseradish. Lemon juice. Vinegar. Other foods Homemade, reduced-sodium, or low-sodium soups. Unsalted popcorn and pretzels. Low-salt or salt-free chips. The items listed above may not be a complete list of foods and beverages you can eat. Contact a dietitian for more information. What foods should I  avoid? Vegetables Sauerkraut, pickled vegetables, and relishes. Olives. [place] fries. Onion rings. Regular canned vegetables (not low-sodium or reduced-sodium). Regular canned tomato sauce and paste (not low-sodium or reduced-sodium). Regular tomato and vegetable juice (not low-sodium or reduced-sodium). Frozen vegetables in sauces. Grains Instant hot cereals. Bread stuffing, pancake, and biscuit mixes. Croutons. Seasoned rice or pasta mixes. Noodle soup cups. Boxed or frozen macaroni and cheese. Regular salted crackers. Self-rising flour. Meats and other proteins Meat or fish that is salted, canned, smoked, spiced, or pickled. Precooked or cured meat, such as sausages or meat loaves. [place] Salines. Ham. Pepperoni. Hot dogs. Corned beef. Chipped beef. Salt pork. Jerky. Pickled herring. Anchovies and sardines. Regular canned tuna. Salted nuts. Dairy Processed cheese and cheese spreads. Hard cheeses. Cheese curds. Blue cheese. Feta cheese. String cheese. Regular cottage cheese.  Buttermilk. Canned milk. Fats and oils Salted butter. Regular margarine. Ghee. Bacon fat. Seasonings and condiments Onion salt, garlic salt, seasoned salt, table salt, and sea salt. Canned and packaged gravies. Worcestershire sauce. Tartar sauce. Barbecue sauce. Teriyaki sauce. Soy sauce, including reduced-sodium. Steak sauce. Fish sauce. Oyster sauce. Cocktail sauce. Horseradish that you find on the shelf. Regular ketchup and mustard. Meat flavorings and tenderizers. Bouillon cubes. Hot sauce. Pre-made or packaged marinades. Pre-made or packaged taco seasonings. Relishes. Regular salad dressings. Salsa. Other foods Salted popcorn and pretzels. Corn chips and puffs. Potato and tortilla chips. Canned or dried soups. Pizza. Frozen entrees and pot pies. The items listed above may not be a complete list of foods and beverages you should avoid. Contact a dietitian for more information. Summary Eating less sodium can help lower your blood pressure, reduce swelling, and protect your heart, liver, and kidneys. Most people on this plan should limit their sodium intake to 1,500-2,000 mg (milligrams) of sodium each day. Canned, boxed, and frozen foods are high in sodium. Restaurant foods, fast foods, and pizza are also very high in sodium. You also get sodium by adding salt to food. Try to cook at home, eat more fresh fruits and vegetables, and eat less fast food and canned, processed, or prepared foods. This information is not intended to replace advice given to you by your health care provider. Make sure you discuss any questions you have with your health care provider. Document Revised: 07/31/2019 Document Reviewed: 07/26/2019 Elsevier Patient Education  [place].

## 2022-11-04 NOTE — Plan of Care (Signed)
Chronic Care Management Provider Comprehensive Care Plan    11/04/2022 Name: Tony Manning MRN: [id number] DOB: [birth year]  Referral to Chronic Care Management (CCM) services was placed by Provider:  [name] MD on Date: 09/06/22.  Chronic Condition 1: HYPERTENSION Provider Assessment and Plan   Primary hypertension  I10  This is annual visit, fortunately he has been doing well, he has not had any recurrence of angina pectoris, no clinical evidence of heart failure.  Physical examination is unremarkable, vascular examination is normal as well.   I reviewed his external labs, diabetes is much improved, lipids are also well controlled.  Stable from cardiac standpoint, continue present medications and I will see him back on an annual basis.  He has had a low risk stress test a year ago.  Expected Outcome/Goals Addressed This Visit (Provider CCM goals/Provider Assessment and plan  CCM (HYPERTENSION) EXPECTED OUTCOME: MONITOR, SELF-MANAGE AND REDUCE SYMPTOMS OF HYPERTENSION  Symptom Management Condition 1: Take medications as prescribed   Attend all scheduled provider appointments Call pharmacy for medication refills 3-7 days in advance of running out of medications Attend church or other social activities Perform all self care activities independently  Perform IADL's (shopping, preparing meals, housekeeping, managing finances) independently Call provider office for new concerns or questions  check blood pressure weekly choose a place to take my blood pressure (home, clinic or office, retail store) write blood pressure results in a log or diary learn about high blood pressure take blood pressure log to all doctor appointments call doctor for signs and symptoms of high blood pressure keep all doctor appointments take medications for blood pressure exactly as prescribed begin an exercise program report new symptoms to your doctor eat more whole grains, fruits and vegetables,  lean meats and healthy fats Look over education sent via my chart- low sodium diet fall prevention strategies: change position slowly, use assistive device such as walker or cane (per provider recommendations) when walking, keep walkways clear, have good lighting in room. It is important to contact your provider if you have any falls, maintain muscle strength/tone by exercise per provider recommendations.  Chronic Condition 2: DIABETES Provider Assessment and Plan  Problem: Diabetes mellitus with neurological manifestation [facility])  Editor: Tonia Ghent, MD (Physician)              He isn't going to be able to get semaglutide due to cost after April with current dosing.  He was asking about getting '1mg'$  dosing and using 1/2 dose.  I told him I will check on options.  See following phone note.       Expected Outcome/Goals Addressed This Visit (Provider CCM goals/Provider Assessment and plan  CCM (DIABETES) EXPECTED OUTCOME: MONITOR, SELF- MANAGE AND REDUCE SYMPTOMS OF DIABETES  Symptom Management Condition 2: Take medications as prescribed   Attend all scheduled provider appointments Call pharmacy for medication refills 3-7 days in advance of running out of medications Attend church or other social activities Perform all self care activities independently  Perform IADL's (shopping, preparing meals, housekeeping, managing finances) independently Call provider office for new concerns or questions  check blood sugar at prescribed times: per [facility]  check feet daily for cuts, sores or redness trim toenails straight across fill half of plate with vegetables limit fast food meals to no more than 1 per week manage portion size prepare main meal at home 3 to 5 days each week read food labels for fat, fiber, carbohydrates and portion size Look over  education sent via my chart- hypoglycemia Eat 3 meals per day Pharmacist to call you on 11/05/22 at 9 am  Problem List Patient Active  Problem List   Diagnosis Date Noted   URI (upper respiratory infection) 08/16/2022   Concussion 08/05/2022   Acute cough 02/05/2022   Near syncope 12/25/2021   COVID-19 11/14/2021   Advance care planning 06/13/2021   Insomnia 06/13/2021   B12 deficiency 01/30/2021   Fatigue 01/29/2021   Abdominal discomfort 01/05/2021   SOB (shortness of breath) 11/12/2020   Sarcoidosis 11/12/2020   H/O colonoscopy 10/27/2020   Nocturia 05/03/2020   Right sided weakness 03/06/2020   Low back pain 01/29/2020   Facial weakness 11/16/2019   Hematuria 03/22/2018   Renal stones 03/22/2018   Vitamin D deficiency, unspecified 10/04/2017   Chronic neck pain (Primary Area of Pain) (B) (L>R) 09/21/2017   Chronic upper extremity pain (Secondary Area of Pain) (Bilateral) (L>R) 09/21/2017   Chronic left shoulder pain (Tertiary Area of Pain) 09/21/2017   Chronic low back pain (Fourth Area of Pain) (B) (R>L) 09/21/2017   Right leg pain 09/21/2017   Other long term (current) drug therapy 09/21/2017   Other specified health status 09/21/2017   Other chronic pain 08/22/2017   Other fatigue 05/10/2017   Radiculopathy 04/07/2017   Angina pectoris ([place]) 10/15/2016   Cervical radiculitis 07/23/2016   Dysphagia 06/12/2016   Sciatica 02/06/2016   Plantar fasciitis 01/16/2016   Right sided sciatica 05/16/2015   Hand pain 04/05/2015   Premature ejaculation 09/17/2014   Heel pain 09/17/2014   NSTEMI (non-ST elevated myocardial infarction) ([place]) 06/13/2014   Atypical chest pain 06/06/2014   Memory change 05/24/2014   Benign paroxysmal positional vertigo 07/13/2013   Orthostasis 07/13/2013   Migraine, unspecified, without mention of intractable migraine without mention of status migrainosus 04/03/2013   Pain in joint, ankle and foot 10/19/2012   Irritable mood 01/18/2012   Neck pain 12/03/2010   Hearing loss 12/03/2010   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[place]) 10/20/2010   HYPERCHOLESTEROLEMIA  10/20/2010   DEPRESSION 10/20/2010   Osteoarthritis 10/20/2010   ARTHRITIS 10/20/2010   NEPHROLITHIASIS, HX OF 10/20/2010   ESSENTIAL HYPERTENSION, BENIGN 09/30/2010   CORONARY ATHEROSCLEROSIS NATIVE CORONARY ARTERY 09/30/2010   Sleep apnea 09/30/2010   CHEST PAIN UNSPECIFIED 09/30/2010   PERCUTANEOUS TRANSLUMINAL CORONARY ANGIOPLASTY, HX OF 09/30/2010    Medication Management  Current Outpatient Medications:    amLODipine (NORVASC) 5 MG tablet, Take 1 tablet (5 mg total) by mouth every morning., Disp: 90 tablet, Rfl: 3   aspirin 81 MG chewable tablet, Chew 81 mg by mouth at bedtime., Disp: , Rfl:    atorvastatin (LIPITOR) 40 MG tablet, TAKE ONE TABLET BY MOUTH EVERY MORNING, Disp: 90 tablet, Rfl: 2   Blood Glucose Monitoring Suppl (ONETOUCH VERIO) w/Device KIT, Use to check blood sugars TID. Dx: E11.9, Disp: 1 kit, Rfl: 0   carvedilol (COREG) 6.25 MG tablet, TAKE ONE TABLET BY MOUTH TWICE DAILY, Disp: 180 tablet, Rfl: 2   Cholecalciferol (VITAMIN D3) 50 MCG (2000 UT) CAPS, Take 2 capsules (4,000 Units total) by mouth daily., Disp: 30 capsule, Rfl:    Continuous Blood Gluc Sensor (FREESTYLE LIBRE 3 SENSOR) MISC, by Does not apply route., Disp: , Rfl:    cyanocobalamin (VITAMIN B12) 1000 MCG tablet, Take 1 tablet (1,000 mcg total) by mouth daily., Disp: , Rfl:    cyclobenzaprine (FLEXERIL) 5 MG tablet, Take 1 tablet (5 mg total) by mouth 3 (three) times daily as needed for  muscle spasms (sedation caution)., Disp: 60 tablet, Rfl: 2   diclofenac Sodium (VOLTAREN) 1 % GEL, Apply 2-4 g topically 4 (four) times daily., Disp: 100 g, Rfl: 5   famotidine (PEPCID) 20 MG tablet, TAKE ONE TABLET BY MOUTH EVERYDAY AT BEDTIME, Disp: 90 tablet, Rfl: 0   gabapentin (NEURONTIN) 300 MG capsule, 1-2 in the PM, sedation caution., Disp: , Rfl:    glimepiride (AMARYL) 4 MG tablet, Take 1 tablet (4 mg total) by mouth daily with breakfast., Disp: , Rfl:    insulin degludec (TRESIBA FLEXTOUCH) 100 UNIT/ML  FlexTouch Pen, Inject 20 Units into the skin daily., Disp: , Rfl:    isosorbide mononitrate (IMDUR) 30 MG 24 hr tablet, TAKE ONE TABLET BY MOUTH EVERYDAY AT BEDTIME, Disp: 90 tablet, Rfl: 2   losartan (COZAAR) 100 MG tablet, TAKE ONE TABLET BY MOUTH EVERYDAY AT BEDTIME, Disp: 90 tablet, Rfl: 1   metFORMIN (GLUCOPHAGE) 500 MG tablet, TAKE ONE TABLET BY MOUTH TWICE DAILY, Disp: 180 tablet, Rfl: 2   ondansetron (ZOFRAN) 4 MG tablet, Take 4 mg by mouth every 8 (eight) hours as needed., Disp: , Rfl:    oxyCODONE-acetaminophen (PERCOCET/ROXICET) 5-325 MG tablet, Take 1 tablet by mouth 4 (four) times daily., Disp: , Rfl:    pantoprazole (PROTONIX) 40 MG tablet, TAKE ONE TABLET BY MOUTH EVERY MORNING, Disp: 90 tablet, Rfl: 1   Semaglutide,0.25 or 0.'5MG'$ /DOS, (OZEMPIC, 0.25 OR 0.5 MG/DOSE,) 2 MG/3ML SOPN, Inject 0.5 mg into the skin once a week., Disp: 3 mL, Rfl: 5   tamsulosin (FLOMAX) 0.4 MG CAPS capsule, TAKE ONE CAPSULE BY MOUTH EVERY MORNING and TAKE ONE CAPSULE BY MOUTH EVERYDAY AT BEDTIME, Disp: 60 capsule, Rfl: 2   nitroGLYCERIN (NITROSTAT) 0.4 MG SL tablet, Place 1 tablet (0.4 mg total) under the tongue every 5 (five) minutes as needed for chest pain. (Patient not taking: Reported on 11/04/2022), Disp: 25 tablet, Rfl: 3  Cognitive Assessment Identity Confirmed: : Name; DOB Cognitive Status: Normal   Functional Assessment Hearing Difficulty or Deaf: no Wear Glasses or Blind: yes Vision Management: reading glasses Concentrating, Remembering or Making Decisions Difficulty (CP): no Difficulty Communicating: no Difficulty Eating/Swallowing: no Walking or Climbing Stairs Difficulty: no Dressing/Bathing Difficulty: no Doing Errands Independently Difficulty (such as shopping) (CP): no   Caregiver Assessment  Primary Source of Support/Comfort: spouse Name of Support/Comfort Primary Source: spouse  Chiropractor People in Home: spouse   Planned Interventions  Provided education to patient about  basic DM disease process; Reviewed medications with patient and discussed importance of medication adherence;        Reviewed prescribed diet with patient carbohydrate modified; Discussed plans with patient for ongoing care management follow up and provided patient with direct contact information for care management team;      Provided patient with written educational materials related to hypo and hyperglycemia and importance of correct treatment;       Advised patient, providing education and rationale, to check cbg per [employer] [place]  and record        call provider for findings outside established parameters;       Review of patient status, including review of consultants reports, relevant laboratory and other test results, and medications completed;       Screening for signs and symptoms of depression related to chronic disease state;        Assessed social determinant of health barriers;        Evaluation of current treatment plan related to hypertension self management  and patient's adherence to plan as established by provider;   Provided education to patient re: stroke prevention, s/s of heart attack and stroke; Reviewed prescribed diet low sodium Reviewed medications with patient and discussed importance of compliance;  Counseled on the importance of exercise goals with target of 150 minutes per week Discussed plans with patient for ongoing care management follow up and provided patient with direct contact information for care management team; Advised patient, providing education and rationale, to monitor blood pressure daily and record, calling PCP for findings outside established parameters;  Provided education on prescribed diet low sodium;  Discussed complications of poorly controlled blood pressure such as heart disease, stroke, circulatory complications, vision complications, kidney impairment, sexual dysfunction;  Screening for signs and symptoms of depression related to  chronic disease state;  Assessed social determinant of health barriers;  Reviewed safety precautions  Interaction and coordination with outside resources, practitioners, and providers See CCM Referral  Care Plan: Available in MyChart

## 2022-11-04 NOTE — Chronic Care Management (AMB) (Signed)
Chronic Care Management   CCM RN Visit Note  11/04/2022 Name: Tony Manning MRN: [id number] DOB: 05-15-1961  Subjective: Tony Manning is a 62 y.o. year old male who is a primary care patient of Tonia Ghent, MD. The patient was referred to the Chronic Care Management team for assistance with care management needs subsequent to provider initiation of CCM services and plan of care.    Today's Visit:  Engaged with patient by telephone for initial visit.     SDOH Interventions Today    Flowsheet Row Most Recent Value  SDOH Interventions   Food Insecurity Interventions Intervention Not Indicated  Housing Interventions Intervention Not Indicated  Transportation Interventions Intervention Not Indicated  Utilities Interventions Intervention Not Indicated  Financial Strain Interventions Intervention Not Indicated  Physical Activity Interventions Intervention Not Indicated  Stress Interventions Intervention Not Indicated  Social Connections Interventions Intervention Not Indicated         Goals Addressed             This Visit's Progress    CCM (DIABETES) EXPECTED OUTCOME: MONITOR, SELF- MANAGE AND REDUCE SYMPTOMS OF DIABETES       Current Barriers:  Knowledge Deficits related to Diabetes management Chronic Disease Management support and education needs related to Diabetes, diet Patient reports CBG monitored with [employer], states fasting CBG today 170, random reading last hs 232, reports 7 day average is 177 and 14 day average is 176. Patient reports he will be getting assistance for ozempic, pharmacist to outreach pt on 11/05/22.  Planned Interventions: Provided education to patient about basic DM disease process; Reviewed medications with patient and discussed importance of medication adherence;        Reviewed prescribed diet with patient carbohydrate modified; Discussed plans with patient for ongoing care management follow up and provided patient with  direct contact information for care management team;      Provided patient with written educational materials related to hypo and hyperglycemia and importance of correct treatment;       Advised patient, providing education and rationale, to check cbg per [employer] [place]  and record        call provider for findings outside established parameters;       Review of patient status, including review of consultants reports, relevant laboratory and other test results, and medications completed;       Screening for signs and symptoms of depression related to chronic disease state;        Assessed social determinant of health barriers;         Symptom Management: Take medications as prescribed   Attend all scheduled provider appointments Call pharmacy for medication refills 3-7 days in advance of running out of medications Attend church or other social activities Perform all self care activities independently  Perform IADL's (shopping, preparing meals, housekeeping, managing finances) independently Call provider office for new concerns or questions  check blood sugar at prescribed times: per [facility]  check feet daily for cuts, sores or redness trim toenails straight across fill half of plate with vegetables limit fast food meals to no more than 1 per week manage portion size prepare main meal at home 3 to 5 days each week read food labels for fat, fiber, carbohydrates and portion size Look over education sent via my chart- hypoglycemia Eat 3 meals per day Pharmacist to call you on 11/05/22 at 9 am  Follow Up Plan: Telephone follow up appointment with care management team member scheduled for:  01/21/23 at 130 pm       CCM (HYPERTENSION) EXPECTED OUTCOME: MONITOR, SELF-MANAGE AND REDUCE SYMPTOMS OF HYPERTENSION       Current Barriers:  Knowledge Deficits related to Hypertension management Chronic Disease Management support and education needs related to Hypertension, diet Patient  reports he lives with spouse, is independent with all aspects of his care, works part time, has all medications and taking as prescribed Patient reports he has blood pressure cuff but does not monitor blood pressure Patient reports he has had several falls this past year, one of the falls resulted in a concussion (tripped over something at work)  Planned Interventions: Evaluation of current treatment plan related to hypertension self management and patient's adherence to plan as established by provider;   Provided education to patient re: stroke prevention, s/s of heart attack and stroke; Reviewed prescribed diet low sodium Reviewed medications with patient and discussed importance of compliance;  Counseled on the importance of exercise goals with target of 150 minutes per week Discussed plans with patient for ongoing care management follow up and provided patient with direct contact information for care management team; Advised patient, providing education and rationale, to monitor blood pressure daily and record, calling PCP for findings outside established parameters;  Provided education on prescribed diet low sodium;  Discussed complications of poorly controlled blood pressure such as heart disease, stroke, circulatory complications, vision complications, kidney impairment, sexual dysfunction;  Screening for signs and symptoms of depression related to chronic disease state;  Assessed social determinant of health barriers;  Reviewed safety precautions  Symptom Management: Take medications as prescribed   Attend all scheduled provider appointments Call pharmacy for medication refills 3-7 days in advance of running out of medications Attend church or other social activities Perform all self care activities independently  Perform IADL's (shopping, preparing meals, housekeeping, managing finances) independently Call provider office for new concerns or questions  check blood pressure  weekly choose a place to take my blood pressure (home, clinic or office, retail store) write blood pressure results in a log or diary learn about high blood pressure take blood pressure log to all doctor appointments call doctor for signs and symptoms of high blood pressure keep all doctor appointments take medications for blood pressure exactly as prescribed begin an exercise program report new symptoms to your doctor eat more whole grains, fruits and vegetables, lean meats and healthy fats Look over education sent via my chart- low sodium diet fall prevention strategies: change position slowly, use assistive device such as walker or cane (per provider recommendations) when walking, keep walkways clear, have good lighting in room. It is important to contact your provider if you have any falls, maintain muscle strength/tone by exercise per provider recommendations.  Follow Up Plan: Telephone follow up appointment with care management team member scheduled for:  01/21/23 at 130 pm          Plan:Telephone follow up appointment with care management team member scheduled for:  01/21/23 at 130 pm  Jacqlyn Larsen [facility], BSN RN Case Manager [place] [phone]

## 2022-11-04 NOTE — Telephone Encounter (Signed)
Pt returned call requesting a call back

## 2022-11-04 NOTE — Telephone Encounter (Signed)
   CCM RN Visit Note   11/04/22 Name: Tony Manning MRN: [id number]      DOB: 07/10/1961  Subjective: Tony Manning is a 62 y.o. year old male who is a primary care patient of @PCP$ . The patient was referred to the Chronic Care Management team for assistance with care management needs subsequent to provider initiation of CCM services and plan of care.      An unsuccessful telephone outreach was attempted today to contact the patient about Chronic Care Management needs.    Plan:Telephone follow up appointment with care management team member scheduled for:  upon care guide rescheduling.  Jacqlyn Larsen [facility], BSN RN Case Manager [place]  [phone]

## 2022-11-05 ENCOUNTER — Other Ambulatory Visit: Payer: Medicare HMO

## 2022-11-05 DIAGNOSIS — I10 Essential (primary) hypertension: Secondary | ICD-10-CM | POA: Diagnosis not present

## 2022-11-05 DIAGNOSIS — E1159 Type 2 diabetes mellitus with other circulatory complications: Secondary | ICD-10-CM | POA: Diagnosis not present

## 2022-11-05 DIAGNOSIS — Z794 Long term (current) use of insulin: Secondary | ICD-10-CM

## 2022-11-05 NOTE — Progress Notes (Signed)
11/05/2022 Name: Tony Manning MRN: [id number] DOB: Nov 18, 1960  Chief Complaint  Patient presents with   Medication Management   JALONNIE PAWELSKI is a 62 y.o. year old male who presented for a telephone visit.   They were referred to the pharmacist by their Case Management Team  for assistance in managing medication access.   Patient is participating in a Managed Medicaid Plan:  No  Subjective: Referral to pharmacy to assist patient with Ozempic affordability; but upon chart review and speaking with patient, he was approved for patient assistance 11/03/22. Care Team: Primary Care Provider: Tonia Ghent, MD   Medication Access/Adherence  Current Pharmacy:  [facility] - [place], [place] - [address] [place] [address] Phone: [phone] Fax: [phone]  Patient reports affordability concerns with their medications: Yes  Patient reports access/transportation concerns to their pharmacy: No  Patient reports adherence concerns with their medications:  No    Diabetes: -Current medications: metformin '500mg'$  BID, Tresiba 20 units daily, glimepiride '4mg'$  daily, Ozempic 0.'5mg'$  weekly -Reports excellent adherence to regimen -Current glucose readings: average 174 over the past 7 days per [facility] -09/11/22 labs A1c 8.3, glucose 114 -Patient was without Ozempic for approximately 1 month, and just restarted 0.'5mg'$  dose this week -Patient reports hypoglycemic s/sx including dizziness, shakiness, sweating.  -Patient denies hyperglycemic symptoms including polyuria, polydipsia, polyphagia, nocturia, neuropathy, blurred vision.  Current meal patterns:  - Endorses typically only eating 1 full meal most days - Drinks 1 regular soda in the morning, then will have diet soda and Propel (sugar free)  Current medication access support: Approved for [employer] patient assistance for Ozempic '1mg'$  through  09/07/23  Hypertension: Current medications: Amlodipine '5mg'$  daily, carvedilol 6.'25mg'$  BID, isosorbide mononitrate '30mg'$  daily, losartan '100mg'$  daily -Reports excellent adherence to regimen -Last office BP (10/20/22) 102/52 -Patient has a validated, automated, upper arm home BP cuff but has not been checking regularly -Patient reports hypotensive s/sx including dizziness, lightheadedness.  -Patient denies hypertensive symptoms including headache, chest pain, shortness of breath  Objective: Lab Results  Component Value Date   HGBA1C 8.3 (H) 09/11/2022   Lab Results  Component Value Date   CREATININE 1.78 (H) 09/11/2022   BUN 21 09/11/2022   NA 142 09/11/2022   K 4.5 09/11/2022   CL 107 09/11/2022   CO2 23 09/11/2022   Lab Results  Component Value Date   CHOL 103 09/11/2022   HDL 38 (L) 09/11/2022   LDLCALC 44 09/11/2022   TRIG 127 09/11/2022   CHOLHDL 2.7 09/11/2022   Medications Reviewed Today     Reviewed by Darlina Guys, RPH (Pharmacist) on 11/05/22 at [facility] Status: <None>   Medication Order Taking? Sig Documenting Provider Last Dose Status Informant  amLODipine (NORVASC) 5 MG tablet [id number] Yes Take 1 tablet (5 mg total) by mouth every morning. Cantwell, Celeste C, PA-C Taking Active   aspirin 81 MG chewable tablet [id number] Yes Chew 81 mg by mouth at bedtime. [provider] Taking Active Self  atorvastatin (LIPITOR) 40 MG tablet [id number] Yes TAKE ONE TABLET BY MOUTH EVERY MORNING Tonia Ghent, MD Taking Active   Blood Glucose Monitoring Suppl [facility] VERIO) w/Device KIT [id number] No Use to check blood sugars TID. Dx: E11.9  Patient not taking: Reported on 11/05/2022   Tonia Ghent, MD Not Taking Active   carvedilol (COREG) 6.25 MG tablet [id number] Yes TAKE ONE TABLET BY MOUTH TWICE DAILY Einar Gip,  Ulice Dash, MD Taking Active   Cholecalciferol (VITAMIN D3) 50 MCG (2000 UT) CAPS [id number] Yes Take 2 capsules (4,000 Units total) by mouth daily.  Tonia Ghent, MD Taking Active   Continuous Blood Gluc Sensor (FREESTYLE LIBRE 3 SENSOR) [place] [id number] Yes by Does not apply route. [provider] Taking Active   cyanocobalamin (VITAMIN B12) 1000 MCG tablet [id number] Yes Take 1 tablet (1,000 mcg total) by mouth daily. Tonia Ghent, MD Taking Active   cyclobenzaprine (FLEXERIL) 5 MG tablet [id number] Yes Take 1 tablet (5 mg total) by mouth 3 (three) times daily as needed for muscle spasms (sedation caution). Tonia Ghent, MD Taking Active            Med Note Colin Rhein, [facility] A   Thu Nov 05, 2022  9:15 AM) Taking 1 in the evening  diclofenac Sodium (VOLTAREN) 1 % GEL [id number] Yes Apply 2-4 g topically 4 (four) times daily. Tonia Ghent, MD Taking Active   gabapentin (NEURONTIN) 300 MG capsule [id number] Yes 1-2 in the PM, sedation caution. Tonia Ghent, MD Taking Active   glimepiride (AMARYL) 4 MG tablet [id number] Yes Take 1 tablet (4 mg total) by mouth daily with breakfast. Tonia Ghent, MD Taking Active   insulin degludec (TRESIBA FLEXTOUCH) 100 UNIT/ML FlexTouch Pen [id number] Yes Inject 20 Units into the skin daily. Tonia Ghent, MD Taking Active   isosorbide mononitrate (IMDUR) 30 MG 24 hr tablet [id number] Yes TAKE ONE TABLET BY MOUTH EVERYDAY AT BEDTIME Adrian Prows, MD Taking Active   losartan (COZAAR) 100 MG tablet [id number] Yes TAKE ONE TABLET BY MOUTH EVERYDAY AT BEDTIME Adrian Prows, MD Taking Active   metFORMIN (GLUCOPHAGE) 500 MG tablet [id number] Yes TAKE ONE TABLET BY MOUTH TWICE DAILY Tonia Ghent, MD Taking Active   nitroGLYCERIN (NITROSTAT) 0.4 MG SL tablet [id number] No Place 1 tablet (0.4 mg total) under the tongue every 5 (five) minutes as needed for chest pain.  Patient not taking: Reported on 11/04/2022   Miquel Dunn, NP Not Taking Active   ondansetron [facility]) 4 MG tablet [id number] No Take 4 mg by mouth every 8 (eight) hours as needed.  Patient not taking: Reported on 11/05/2022    [provider] Not Taking Active   oxyCODONE-acetaminophen (PERCOCET/ROXICET) 5-325 MG tablet [id number] Yes Take 1 tablet by mouth 4 (four) times daily. [provider] Taking Active            Med Note Darlina Guys   Thu Nov 05, 2022  9:18 AM) Takes half tablet daily  pantoprazole (PROTONIX) 40 MG tablet [id number] Yes TAKE ONE TABLET BY MOUTH EVERY MORNING Tonia Ghent, MD Taking Active   Semaglutide,0.25 or 0.'5MG'$ /DOS, (OZEMPIC, 0.25 OR 0.5 MG/DOSE,) 2 MG/3ML SOPN [id number] Yes Inject 0.5 mg into the skin once a week. Tonia Ghent, MD Taking Active   tamsulosin [facility]) 0.4 MG CAPS capsule [id number] Yes TAKE ONE CAPSULE BY MOUTH EVERY MORNING and TAKE ONE CAPSULE BY MOUTH EVERYDAY AT BEDTIME Tonia Ghent, MD Taking Active            Assessment/Plan:  Diabetes: - Currently uncontrolled - Reviewed dietary modifications including eliminating sugary sodas - Educated on properly addressing hypoglycemia - Recommend to pick up Ozempic '1mg'$  when available at PCP office (patient knows to expect call when it has arrived) - Recommend to check glucose continuously with [facility] 3 and reach out if consistently hpo/hypergycemic   Hypertension: - Currently controlled - Reviewed appropriate blood  pressure monitoring technique and reviewed goal blood pressure. Recommended to check home blood pressure and heart rate  - Recommend to begin checking BP daily and recording, so we can make sure he is not hypotensive -Reviewed goal BP of <130/80, but no lower than 100/50- instructed to contact PCP or myself if consistently outside of goal   Follow Up Plan: Telephone follow-up scheduled in 4 weeks to see if patient received '1mg'$  Ozempic.  Will also follow up on home BG and BP and further discuss lifestyle modifications at that time.  Suggest A1c and CMP 3 mos after starting '1mg'$  weekly dose.  Darlina Guys, PharmD, DPLA

## 2022-11-20 ENCOUNTER — Telehealth: Payer: Self-pay

## 2022-11-20 NOTE — Progress Notes (Signed)
Care Management & Coordination Services Pharmacy Team  Reason for Encounter: Medication Coordination and Delivery  Contacted patient to discuss medications and coordinate delivery from [facility].  Spoke with patient on 11/20/2022   Cycle dispensing form sent to Regan Lemming, PharmD for review.   Last adherence delivery date: 11/03/2022      Patient is due for next adherence delivery on: 12/02/2022   This delivery to include: Adherence Packaging  30 Days  Packs: Amlodipine 5 mg- 1 tablet daily (1 breakfast) Aspirin 81mg - 1 tablet daily (breakfast) Metformin 500mg  1 tablet twice daily (1 tablet  breakfast, 1 tablet evening meal) Carvedilol 6.25mg - 1 tablet twice daily (1 breakfast, 1 evening meal) Isosorbide 30mg - 1 tablet daily (1 bedtime) Tamsulosin 0.4mg  2 capsule daily (1 breakfast, 1 bedtime)   Losartan 100 mg- 1 tablet daily- (1 bedtime) Atorvastatin 40mg - 1 tablet daily (1 breakfast) Glimepiride 4mg - 1 tablet daily (1 breakfast) Pantoprazole 40 mg- 1 tablet daily (1 breakfast)   Vial medications: None at this time    Receives from Manufacturer: Ozempic  Patient declined the following medications this month: Gabapentin 300 mg - 1 - 2 in the PM, sedation caution Cyclobenzaprine 5 mg - 1 tablet 3x daily PRN  Refills requested from providers include: Not complete Tamsulosin 0.4mg  2 capsule daily (1 breakfast, 1 bedtime)  Gabapentin 300 mg - 1 - 2 in the PM, sedation caution   Confirmed delivery date of 12/02/2022, advised patient that pharmacy will contact them the morning of delivery.   Any concerns about your medications? No  How often do you forget or accidentally miss a dose? Rarely  Do you use a pillbox? No  Is patient in packaging Yes  If yes  What is the date on your next pill pack? Patient was not home for date  Any concerns or issues with your packaging? No  Recent blood glucose readings are as follows: Patient was not home.  Chart  review: Recent office visits:  None since last contact  Recent consult visits:  None since last contact  Hospital visits:  None in previous 6 months  Medications: Outpatient Encounter Medications as of 11/20/2022  Medication Sig Note   amLODipine (NORVASC) 5 MG tablet Take 1 tablet (5 mg total) by mouth every morning.    aspirin 81 MG chewable tablet Chew 81 mg by mouth at bedtime.    atorvastatin (LIPITOR) 40 MG tablet TAKE ONE TABLET BY MOUTH EVERY MORNING    Blood Glucose Monitoring Suppl (ONETOUCH VERIO) w/Device KIT Use to check blood sugars TID. Dx: E11.9 (Patient not taking: Reported on 11/05/2022)    carvedilol (COREG) 6.25 MG tablet TAKE ONE TABLET BY MOUTH TWICE DAILY    Cholecalciferol (VITAMIN D3) 50 MCG (2000 UT) CAPS Take 2 capsules (4,000 Units total) by mouth daily.    Continuous Blood Gluc Sensor (FREESTYLE LIBRE 3 SENSOR) MISC by Does not apply route.    cyanocobalamin (VITAMIN B12) 1000 MCG tablet Take 1 tablet (1,000 mcg total) by mouth daily.    cyclobenzaprine (FLEXERIL) 5 MG tablet Take 1 tablet (5 mg total) by mouth 3 (three) times daily as needed for muscle spasms (sedation caution). 11/05/2022: Taking 1 in the evening   diclofenac Sodium (VOLTAREN) 1 % GEL Apply 2-4 g topically 4 (four) times daily.    gabapentin (NEURONTIN) 300 MG capsule 1-2 in the PM, sedation caution.    glimepiride (AMARYL) 4 MG tablet Take 1 tablet (4 mg total) by mouth daily with breakfast.  insulin degludec (TRESIBA FLEXTOUCH) 100 UNIT/ML FlexTouch Pen Inject 20 Units into the skin daily.    isosorbide mononitrate (IMDUR) 30 MG 24 hr tablet TAKE ONE TABLET BY MOUTH EVERYDAY AT BEDTIME    losartan (COZAAR) 100 MG tablet TAKE ONE TABLET BY MOUTH EVERYDAY AT BEDTIME    metFORMIN (GLUCOPHAGE) 500 MG tablet TAKE ONE TABLET BY MOUTH TWICE DAILY    nitroGLYCERIN (NITROSTAT) 0.4 MG SL tablet Place 1 tablet (0.4 mg total) under the tongue every 5 (five) minutes as needed for chest pain. (Patient  not taking: Reported on 11/04/2022)    ondansetron (ZOFRAN) 4 MG tablet Take 4 mg by mouth every 8 (eight) hours as needed. (Patient not taking: Reported on 11/05/2022)    oxyCODONE-acetaminophen (PERCOCET/ROXICET) 5-325 MG tablet Take 1 tablet by mouth 4 (four) times daily. 11/05/2022: Takes half tablet daily   pantoprazole (PROTONIX) 40 MG tablet TAKE ONE TABLET BY MOUTH EVERY MORNING    Semaglutide,0.25 or 0.5MG /DOS, (OZEMPIC, 0.25 OR 0.5 MG/DOSE,) 2 MG/3ML SOPN Inject 0.5 mg into the skin once a week.    tamsulosin (FLOMAX) 0.4 MG CAPS capsule TAKE ONE CAPSULE BY MOUTH EVERY MORNING and TAKE ONE CAPSULE BY MOUTH EVERYDAY AT BEDTIME    No facility-administered encounter medications on file as of 11/20/2022.   BP Readings from Last 3 Encounters:  10/20/22 (!) 102/52  09/11/22 104/60  08/13/22 118/62    Pulse Readings from Last 3 Encounters:  10/20/22 70  09/11/22 67  08/13/22 68    Lab Results  Component Value Date/Time   HGBA1C 8.3 (H) 09/11/2022 04:02 PM   HGBA1C 8.0 (A) 06/12/2022 07:57 AM   HGBA1C 7.2 (H) 12/23/2021 11:47 AM   Lab Results  Component Value Date   CREATININE 1.78 (H) 09/11/2022   BUN 21 09/11/2022   GFR 47.53 (L) 01/09/2022   GFRNONAA 54 (L) 11/12/2020   GFRAA 42 (L) 05/05/2019   NA 142 09/11/2022   K 4.5 09/11/2022   CALCIUM 9.7 09/11/2022   CO2 23 09/11/2022   Charlene Brooke, CPP notified   Marijean Niemann, [place] Clinical Pharmacy Assistant [phone]

## 2022-11-22 ENCOUNTER — Other Ambulatory Visit: Payer: Self-pay | Admitting: Family Medicine

## 2022-11-27 ENCOUNTER — Telehealth: Payer: Self-pay | Admitting: Family Medicine

## 2022-11-27 DIAGNOSIS — Z794 Long term (current) use of insulin: Secondary | ICD-10-CM | POA: Diagnosis not present

## 2022-11-27 DIAGNOSIS — M75121 Complete rotator cuff tear or rupture of right shoulder, not specified as traumatic: Secondary | ICD-10-CM | POA: Diagnosis not present

## 2022-11-27 DIAGNOSIS — E1149 Type 2 diabetes mellitus with other diabetic neurological complication: Secondary | ICD-10-CM | POA: Diagnosis not present

## 2022-11-27 DIAGNOSIS — G8929 Other chronic pain: Secondary | ICD-10-CM

## 2022-11-27 NOTE — Telephone Encounter (Signed)
Done. Thanks.

## 2022-11-27 NOTE — Addendum Note (Signed)
Addended by: Tonia Ghent on: 11/27/2022 01:05 PM   Modules accepted: Orders

## 2022-11-27 NOTE — Telephone Encounter (Signed)
Left VM for patient that referral was done.

## 2022-11-27 NOTE — Telephone Encounter (Signed)
Patient called in and stated that he needs a new referral to be sent to [facility] in [place]. He stated that the last one has expired. Thank you!

## 2022-12-03 ENCOUNTER — Other Ambulatory Visit: Payer: Medicare HMO

## 2022-12-03 NOTE — Progress Notes (Signed)
   12/03/2022  Patient ID: [name], male   DOB: 09-10-1960, 62 y.o.   MRN: [id number]  Subjective/Objective: DM -Patient received Ozempic from PAP, and just started 1mg  dose this week.  States he is tolerating well. -Using Libre CGM- average over past 7 days: 144 -Endorses rarely experience s/sx of hypoglycemia  HTN -Has BP monitor at home but states he is still forgetting to check -No recent feelings of hypotension, which he previously endorsed  Assessment/Plan: DM -Continue current regimen -Recommend A1c in 3 months to evaluate efficacy of Ozempic 1mg .  Could increase dose to 2mg  weekly if tolerating 1mg  and A1c remains elevated.  Would recommend holding glimepiride, if so.  HTN -Continue current regimen  Follow-up:  None needed at this time  FYI -Patient endorses recent heartburn/reflux after eating even with taking pantoprazole daily.  He is unsure if he has tried other PPI's, but states he will make an appointment for evaluation with Dr. [name].  Darlina Guys, PharmD, DPLA

## 2022-12-17 ENCOUNTER — Other Ambulatory Visit: Payer: Self-pay | Admitting: Family Medicine

## 2022-12-17 ENCOUNTER — Other Ambulatory Visit: Payer: Self-pay | Admitting: Cardiology

## 2022-12-17 DIAGNOSIS — I25118 Atherosclerotic heart disease of native coronary artery with other forms of angina pectoris: Secondary | ICD-10-CM

## 2022-12-21 ENCOUNTER — Telehealth: Payer: Self-pay

## 2022-12-21 NOTE — Progress Notes (Signed)
Care Management & Coordination Services Pharmacy Team  Reason for Encounter: Medication Coordination and Delivery  Contacted patient to discuss medications and coordinate delivery from [facility].  Spoke with patient on 12/21/2022   Cycle dispensing form sent to Chrys Racer, PharmD for review.   Last adherence delivery date: 12/02/2022      Patient is due for next adherence delivery on: 12/31/2022  This delivery to include: Adherence Packaging  30 Days  Packs: Amlodipine 5 mg- 1 tablet daily (1 breakfast) Aspirin - 1 tablet daily (breakfast) Metformin  1 tablet twice daily (1 tablet  breakfast, 1 tablet evening meal) Carvedilol 6.25mg - 1 tablet twice daily (1 breakfast, 1 evening meal) Isosorbide - 1 tablet daily (1 bedtime) Tamsulosin 0.4mg  2 capsule daily (1 breakfast, 1 bedtime)   Losartan 100 mg- 1 tablet daily- (1 bedtime) Atorvastatin - 1 tablet daily (1 breakfast) Glimepiride - 1 tablet daily (1 breakfast) Pantoprazole 40 mg- 1 tablet daily (1 breakfast)   Vial medications: None at this time    Receives from Manufacturer: Ozempic   Patient declined the following medications this month: None  Refills requested from providers include: Not complete Amlodipine 5 mg- 1 tablet daily (1 breakfast)  Confirmed delivery date of 12/31/2022, advised patient that pharmacy will contact them the morning of delivery.   Any concerns about your medications? No  How often do you forget or accidentally miss a dose? Rarely  Do you use a pillbox? No  Is patient in packaging Yes  If yes  What is the date on your next pill pack? 12/21/2022  Any concerns or issues with your packaging? Not at this time  Recent blood glucose readings are as follows: Patient stated Al Corpus, PharmD has access to his readings.   Chart review: Recent office visits:  None since last contact  Recent consult visits:  None since last contact  Hospital  visits:  None in previous 6 months  Medications: Outpatient Encounter Medications as of 12/21/2022  Medication Sig Note   amLODipine (NORVASC) 5 MG tablet Take 1 tablet (5 mg total) by mouth every morning.    aspirin 81 MG chewable tablet Chew 81 mg by mouth at bedtime.    atorvastatin (LIPITOR) 40 MG tablet TAKE ONE TABLET BY MOUTH EVERY MORNING    Blood Glucose Monitoring Suppl (ONETOUCH VERIO) w/Device KIT Use to check blood sugars TID. Dx: E11.9 (Patient not taking: Reported on 11/05/2022)    carvedilol (COREG) 6.25 MG tablet TAKE ONE TABLET BY MOUTH TWICE DAILY    Cholecalciferol (VITAMIN D3) 50 MCG (2000 UT) CAPS Take 2 capsules (4,000 Units total) by mouth daily.    Continuous Blood Gluc Sensor (FREESTYLE LIBRE 3 SENSOR) MISC by Does not apply route.    cyanocobalamin (VITAMIN B12) 1000 MCG tablet Take 1 tablet (1,000 mcg total) by mouth daily.    cyclobenzaprine (FLEXERIL) 5 MG tablet Take 1 tablet (5 mg total) by mouth 3 (three) times daily as needed for muscle spasms (sedation caution). 11/05/2022: Taking 1 in the evening   diclofenac Sodium (VOLTAREN) 1 % GEL Apply 2-4 g topically 4 (four) times daily.    gabapentin (NEURONTIN) 300 MG capsule 1-2 in the PM, sedation caution.    glimepiride (AMARYL) 4 MG tablet Take 1 tablet (4 mg total) by mouth daily with breakfast.    insulin degludec (TRESIBA FLEXTOUCH) 100 UNIT/ML FlexTouch Pen Inject 20 Units into the skin daily.    isosorbide mononitrate (IMDUR) 30 MG 24 hr tablet TAKE ONE TABLET BY  MOUTH EVERYDAY AT BEDTIME    losartan (COZAAR) 100 MG tablet TAKE ONE TABLET BY MOUTH EVERYDAY AT BEDTIME    metFORMIN (GLUCOPHAGE) 500 MG tablet TAKE ONE TABLET BY MOUTH TWICE DAILY    nitroGLYCERIN (NITROSTAT) 0.4 MG SL tablet Place 1 tablet (0.4 mg total) under the tongue every 5 (five) minutes as needed for chest pain. (Patient not taking: Reported on 11/04/2022)    ondansetron (ZOFRAN) 4 MG tablet Take 4 mg by mouth every 8 (eight) hours as  needed. (Patient not taking: Reported on 11/05/2022)    oxyCODONE-acetaminophen (PERCOCET/ROXICET) 5-325 MG tablet Take 1 tablet by mouth 4 (four) times daily. 11/05/2022: Takes half tablet daily   pantoprazole (PROTONIX) 40 MG tablet TAKE ONE TABLET BY MOUTH EVERY MORNING    Semaglutide, 1 MG/DOSE, (OZEMPIC, 1 MG/DOSE,) 4 MG/3ML SOPN Inject 1 mg into the skin once a week. 12/03/2022: PAP medication   tamsulosin (FLOMAX) 0.4 MG CAPS capsule TAKE ONE CAPSULE BY MOUTH AT BREAKFAST AND AT BEDTIME    No facility-administered encounter medications on file as of 12/21/2022.   BP Readings from Last 3 Encounters:  10/20/22 (!) 102/52  09/11/22 104/60  08/13/22 118/62    Pulse Readings from Last 3 Encounters:  10/20/22 70  09/11/22 67  08/13/22 68    Lab Results  Component Value Date/Time   HGBA1C 8.3 (H) 09/11/2022 04:02 PM   HGBA1C 8.0 (A) 06/12/2022 07:57 AM   HGBA1C 7.2 (H) 12/23/2021 11:47 AM   Lab Results  Component Value Date   CREATININE 1.78 (H) 09/11/2022   BUN 21 09/11/2022   GFR 47.53 (L) 01/09/2022   GFRNONAA 54 (L) 11/12/2020   GFRAA 42 (L) 05/05/2019   NA 142 09/11/2022   K 4.5 09/11/2022   CALCIUM 9.7 09/11/2022   CO2 23 09/11/2022   Al Corpus, CPP notified   Claudina Lick, [place] Clinical Pharmacy Assistant [phone]

## 2022-12-22 DIAGNOSIS — M545 Low back pain, unspecified: Secondary | ICD-10-CM | POA: Diagnosis not present

## 2022-12-22 DIAGNOSIS — M542 Cervicalgia: Secondary | ICD-10-CM | POA: Diagnosis not present

## 2022-12-22 DIAGNOSIS — Z79891 Long term (current) use of opiate analgesic: Secondary | ICD-10-CM | POA: Diagnosis not present

## 2022-12-22 DIAGNOSIS — M47812 Spondylosis without myelopathy or radiculopathy, cervical region: Secondary | ICD-10-CM | POA: Diagnosis not present

## 2022-12-22 DIAGNOSIS — M25511 Pain in right shoulder: Secondary | ICD-10-CM | POA: Diagnosis not present

## 2022-12-28 ENCOUNTER — Telehealth: Payer: Self-pay

## 2022-12-28 NOTE — Progress Notes (Signed)
Care Management & Coordination Services Pharmacy Team  Reason for Encounter: Appointment Reminder  Contacted patient to confirm telephone appointment with Al Corpus, PharmD on 12/31/2022 at 11:45.  Unsuccessful outreach. Left voicemail for patient to return call.  Star Rating Drugs:  Medication:  Last Fill: Day Supply Atorvastatin 40 mg 11/27/2022 30 Glimepiride 4 mg 11/27/2022 30 Losartan 100 mg 11/27/2022 30 Metformin 500 mg 11/27/2022 30 Ozempic 1 mg  PAP  Care Gaps: Annual wellness visit in last year? Yes 09/22/2022  If Diabetic: Last eye exam / retinopathy screening: Up to date Last diabetic foot exam: Up to date  Al Corpus, PharmD notified  Claudina Lick, [place] Clinical Pharmacy Assistant [phone]

## 2022-12-31 ENCOUNTER — Telehealth: Payer: Self-pay | Admitting: Pharmacist

## 2022-12-31 ENCOUNTER — Encounter: Payer: Medicare HMO | Admitting: Pharmacist

## 2022-12-31 NOTE — Progress Notes (Unsigned)
Care Management & Coordination Services Pharmacy Note  12/31/2022 Name:  Tony Manning MRN:  [id number] DOB:  04/22/1961  Summary: F/U visit -DM: A1c 8.3% (10/23); Pt restarted Ozempic 0.5 mg Sun 1/14 with Evaristo Bury 20 units daily (transitioned from [place]); He is up to 22 units of [place] today, which he takes in the morning (6-7am) -Reviewed AGP report: 09/11/22 to 09/24/22. Sensor active: 85%  Time in range (70-180): 33% (goal > 70%)  High (180-250): 40%  Very high (>250): 24%  Low (< 70): 3% (goal < 4%)  GMI: 8.1%; Average glucose: 202 - pt is dropping low (~60) 1-2pm nearly every day after peaking high before noon (200-300); pt denies activity or medication around this time that would explain sudden drop   Recommendations:  -Advised pt to eat carbs with lunch (~12pm) to try to avoid afternoon lows -Recommend to hold AM dose of glimepiride to try to prevent midday hypoglycemia; advised to wait 2-3 days between insulin adjustments to avoid titrating too quickly   Follow up plan: -Pharmacist follow up televisit scheduled for 2 weeks -PCP visit due ~12/11/22 (3-mo)   Subjective: Tony Manning is an 62 y.o. year old male who is a primary patient of Para March, Dwana Curd, MD.  The care coordination team was consulted for assistance with disease management and care coordination needs.    Engaged with patient by telephone for follow up visit.  [name], [name], [name], [facility] as Pharmacist (Pharmacist) Audrie Gallus, RN as [facility] Care Management  Recent office visits: 10/20/22 Dr Para March OV: f/u - update glimepiride 4 mg once daily; Tresiba 20 units daily. Increase Vit D to 4000 IU daily. Rx b12 1000 mcg daily.  09/11/22 Dr Para March OV: f/u - A1c 8.3%. Increase VitD to 4000 IU daily. Will change Soliqua back to Ozempic  08/13/22 Dr  Para March OV: viral URI - covid negative. Rx doxycycline. Try work 4-5 hrs per shift.    08/04/22 Dr Para March OV: Concussion f/u - brain rest. Avoid screens. Don't drive. F/u 3 weeks.   07/20/22 Dr Patsy Lager OV: f/u concussion - still very symptomatic. Remain out of work x next 3 weeks. Refrain from driving. Limit screen use. RTC 3 weeks.   07/06/22 Dr Patsy Lager OV: concussion, fall at work. Hold out of work and limit physical activity - no driving, using computers or smart phones. F/u 2 weeks.   06/29/22 Dr Para March OV: DM - lows in PM. Reduce Soliqua to 40 units.   06/19/22 Dr Para March OV: f/u - waiting on meter replacement. Leg pain - Increase gabapentin to 300 mg AM and 600 mg PM.   06/12/22 Dr Para March OV: f/u - A1c 8.0%; update about sugars in 2-3 weeks  Recent consult visits: 12/03/22 PharmD: pt using Ozempic 1 mg.  05/06/22 Dr [name] (Cardiology): f/u HF, doing well, no changes.   02/16/22 NP Tammy Parrett (pulmonary): f/u Sarcoid. D/c pregabalin. D/c lasix, Kcl. Ordered PFTs.   01/05/22 PA Elvin So (cardiology): SOB/edema. Rx'd furosemide 20 mg and Kcl 10 mEq. Reduced amlodipine to 5 mg.   10/02/21 Dr Jacquelyne Balint (psychology): chronic pain  - stable for spinal cord stimulator trial  Hospital visits: None in previous 6 months   Objective:  Lab Results  Component Value Date   CREATININE 1.78 (H) 09/11/2022   BUN 21 09/11/2022   GFR 47.53 (L) 01/09/2022  GFRNONAA 54 (L) 11/12/2020   GFRAA 42 (L) 05/05/2019   NA 142 09/11/2022   K 4.5 09/11/2022   CALCIUM 9.7 09/11/2022   CO2 23 09/11/2022   GLUCOSE 114 (H) 09/11/2022    Lab Results  Component Value Date/Time   HGBA1C 8.3 (H) 09/11/2022 04:02 PM   HGBA1C 8.0 (A) 06/12/2022 07:57 AM   HGBA1C 7.2 (H) 12/23/2021 11:47 AM   GFR 47.53 (L) 01/09/2022 12:05 PM   GFR 60.28 12/23/2021 11:47 AM   MICROALBUR 1.4 09/11/2022 04:02 PM   MICROALBUR 1.5 08/15/2012 09:05 AM    Last diabetic Eye exam:  Lab Results  Component Value  Date/Time   HMDIABEYEEXA Retinopathy (A) 05/15/2022 12:00 AM    Last diabetic Foot exam: No results found for: "HMDIABFOOTEX"   Lab Results  Component Value Date   CHOL 103 09/11/2022   HDL 38 (L) 09/11/2022   LDLCALC 44 09/11/2022   TRIG 127 09/11/2022   CHOLHDL 2.7 09/11/2022       Latest Ref Rng & Units 09/11/2022    4:02 PM 06/09/2021    9:23 AM 01/27/2021   12:40 PM  Hepatic Function  Total Protein 6.1 - 8.1 g/dL 7.0  6.8  7.5   Albumin 3.5 - 5.2 g/dL  4.3  4.8   AST 10 - 35 U/L ALT 9 - 46 U/L Alk Phosphatase 39 - 117 U/L  90  78   Total Bilirubin 0.2 - 1.2 mg/dL 0.5  0.5  0.6   Bilirubin, Direct 0.0 - 0.3 mg/dL   0.1     Lab Results  Component Value Date/Time   TSH 1.34 01/27/2021 12:40 PM   TSH 2.22 03/21/2018 12:05 PM       Latest Ref Rng & Units 09/11/2022    4:02 PM 12/23/2021   11:47 AM 06/09/2021    9:23 AM  CBC  WBC 3.8 - 10.8 Thousand/uL 5.8  5.9  7.2   Hemoglobin 13.2 - 17.1 g/dL 95.6  21.3  08.6   Hematocrit 38.5 - 50.0 % 38.6  39.6  40.9   Platelets 140 - 400 Thousand/uL 214  193.0  198.0     Lab Results  Component Value Date/Time   VD25OH 29 (L) 09/11/2022 04:02 PM   VD25OH 23.11 (L) 12/23/2021 11:47 AM   VD25OH 19.37 (L) 03/21/2018 12:05 PM   VITAMINB12 1,316 (H) 09/11/2022 04:02 PM   VITAMINB12 290 12/23/2021 11:47 AM    Clinical ASCVD: Yes  The ASCVD Risk score (Arnett DK, et al., 2019) failed to calculate for the following reasons:   The patient has a prior MI or stroke diagnosis       11/04/2022   10:22 AM 10/20/2022    2:28 PM 09/22/2022    9:16 AM  Depression screen PHQ 2/9  Decreased Interest 0 1 0  Down, Depressed, Hopeless 0 0 0  PHQ - 2 Score 0 1 0  Altered sleeping  2   Tired, decreased energy  1   Change in appetite  0   Feeling bad or failure about yourself   0   Trouble concentrating  1   Moving slowly or fidgety/restless  0   Suicidal thoughts  0   PHQ-9 Score  5   Difficult doing work/chores   Somewhat difficult     Social History   Tobacco Use  Smoking Status Never  Smokeless Tobacco Former   Types:  Chew   Quit date: 02/06/2016   BP Readings from Last 3 Encounters:  10/20/22 (!) 102/52  09/11/22 104/60  08/13/22 118/62   Pulse Readings from Last 3 Encounters:  10/20/22 70  09/11/22 67  08/13/22 68   Wt Readings from Last 3 Encounters:  10/20/22 214 lb (97.1 kg)  09/22/22 215 lb (97.5 kg)  09/11/22 215 lb (97.5 kg)   BMI Readings from Last 3 Encounters:  10/20/22 30.71 kg/m  09/22/22 30.85 kg/m  09/11/22 30.85 kg/m    Allergies  Allergen Reactions   Ambien [Zolpidem] Other (See Comments)    Parasomnias, sleep walking   Lisinopril Cough   Tramadol Diarrhea    Medications Reviewed Today     Reviewed by Lenna Gilford, RPH (Pharmacist) on 11/05/22 at 0919  Med List Status: <None>   Medication Order Taking? Sig Documenting Provider Last Dose Status Informant  amLODipine (NORVASC) 5 MG tablet [id number] Yes Take 1 tablet (5 mg total) by mouth every morning. Cantwell, Celeste C, PA-C Taking Active   aspirin 81 MG chewable tablet [id number] Yes Chew 81 mg by mouth at bedtime. [provider] Taking Active Self  atorvastatin (LIPITOR) 40 MG tablet [id number] Yes TAKE ONE TABLET BY MOUTH EVERY MORNING Joaquim Nam, MD Taking Active   Blood Glucose Monitoring Suppl [facility] VERIO) w/Device KIT [id number] No Use to check blood sugars TID. Dx: E11.9  Patient not taking: Reported on 11/05/2022   Joaquim Nam, MD Not Taking Active   carvedilol (COREG) 6.25 MG tablet [id number] Yes TAKE ONE TABLET BY MOUTH TWICE DAILY Yates Decamp, MD Taking Active   Cholecalciferol (VITAMIN D3) 50 MCG (2000 UT) CAPS [id number] Yes Take 2 capsules (4,000 Units total) by mouth daily. Joaquim Nam, MD Taking Active   Continuous Blood Gluc Sensor (FREESTYLE LIBRE 3 SENSOR) [place] [id number] Yes by Does not apply route. [provider] Taking Active   cyanocobalamin  (VITAMIN B12) 1000 MCG tablet [id number] Yes Take 1 tablet (1,000 mcg total) by mouth daily. Joaquim Nam, MD Taking Active   cyclobenzaprine (FLEXERIL) 5 MG tablet [id number] Yes Take 1 tablet (5 mg total) by mouth 3 (three) times daily as needed for muscle spasms (sedation caution). Joaquim Nam, MD Taking Active            Med Note Littie Deeds, [facility] A   Thu Nov 05, 2022  9:15 AM) Taking 1 in the evening  diclofenac Sodium (VOLTAREN) 1 % GEL [id number] Yes Apply 2-4 g topically 4 (four) times daily. Joaquim Nam, MD Taking Active   gabapentin (NEURONTIN) 300 MG capsule [id number] Yes 1-2 in the PM, sedation caution. Joaquim Nam, MD Taking Active   glimepiride (AMARYL) 4 MG tablet [id number] Yes Take 1 tablet (4 mg total) by mouth daily with breakfast. Joaquim Nam, MD Taking Active   insulin degludec (TRESIBA FLEXTOUCH) 100 UNIT/ML FlexTouch Pen [id number] Yes Inject 20 Units into the skin daily. Joaquim Nam, MD Taking Active   isosorbide mononitrate (IMDUR) 30 MG 24 hr tablet [id number] Yes TAKE ONE TABLET BY MOUTH EVERYDAY AT BEDTIME Yates Decamp, MD Taking Active   losartan (COZAAR) 100 MG tablet [id number] Yes TAKE ONE TABLET BY MOUTH EVERYDAY AT BEDTIME Yates Decamp, MD Taking Active   metFORMIN (GLUCOPHAGE) 500 MG tablet [id number] Yes TAKE ONE TABLET BY MOUTH TWICE DAILY Joaquim Nam, MD Taking Active   nitroGLYCERIN (NITROSTAT) 0.4 MG SL tablet [id number] No Place 1 tablet (0.4 mg total) under the tongue  every 5 (five) minutes as needed for chest pain.  Patient not taking: Reported on 11/04/2022   Toniann Fail, NP Not Taking Active   ondansetron [facility]) 4 MG tablet [id number] No Take 4 mg by mouth every 8 (eight) hours as needed.  Patient not taking: Reported on 11/05/2022   [provider] Not Taking Active   oxyCODONE-acetaminophen (PERCOCET/ROXICET) 5-325 MG tablet [id number] Yes Take 1 tablet by mouth 4 (four) times daily. [provider] Taking  Active            Med Note Lenna Gilford   Thu Nov 05, 2022  9:18 AM) Takes half tablet daily  pantoprazole (PROTONIX) 40 MG tablet [id number] Yes TAKE ONE TABLET BY MOUTH EVERY MORNING Joaquim Nam, MD Taking Active   Semaglutide,0.25 or 0.5MG /DOS, (OZEMPIC, 0.25 OR 0.5 MG/DOSE,) 2 MG/3ML SOPN [id number] Yes Inject 0.5 mg into the skin once a week. Joaquim Nam, MD Taking Active   tamsulosin [facility]) 0.4 MG CAPS capsule [id number] Yes TAKE ONE CAPSULE BY MOUTH EVERY MORNING and TAKE ONE CAPSULE BY MOUTH EVERYDAY AT BEDTIME Joaquim Nam, MD Taking Active             Patient Active Problem List   Diagnosis Date Noted   URI (upper respiratory infection) 08/16/2022   Concussion 08/05/2022   Acute cough 02/05/2022   Near syncope 12/25/2021   COVID-19 11/14/2021   Advance care planning 06/13/2021   Insomnia 06/13/2021   B12 deficiency 01/30/2021   Fatigue 01/29/2021   Abdominal discomfort 01/05/2021   SOB (shortness of breath) 11/12/2020   Sarcoidosis 11/12/2020   H/O colonoscopy 10/27/2020   Nocturia 05/03/2020   Right sided weakness 03/06/2020   Low back pain 01/29/2020   Facial weakness 11/16/2019   Hematuria 03/22/2018   Renal stones 03/22/2018   Vitamin D deficiency, unspecified 10/04/2017   Chronic neck pain (Primary Area of Pain) (B) (L>R) 09/21/2017   Chronic upper extremity pain (Secondary Area of Pain) (Bilateral) (L>R) 09/21/2017   Chronic left shoulder pain (Tertiary Area of Pain) 09/21/2017   Chronic low back pain (Fourth Area of Pain) (B) (R>L) 09/21/2017   Right leg pain 09/21/2017   Other long term (current) drug therapy 09/21/2017   Other specified health status 09/21/2017   Other chronic pain 08/22/2017   Other fatigue 05/10/2017   Radiculopathy 04/07/2017   Angina pectoris 10/15/2016   Cervical radiculitis 07/23/2016   Dysphagia 06/12/2016   Sciatica 02/06/2016   Plantar fasciitis 01/16/2016   Right sided sciatica 05/16/2015   Hand pain  04/05/2015   Premature ejaculation 09/17/2014   Heel pain 09/17/2014   NSTEMI (non-ST elevated myocardial infarction) 06/13/2014   Atypical chest pain 06/06/2014   Memory change 05/24/2014   Benign paroxysmal positional vertigo 07/13/2013   Orthostasis 07/13/2013   Migraine, unspecified, without mention of intractable migraine without mention of status migrainosus 04/03/2013   Pain in joint, ankle and foot 10/19/2012   Irritable mood 01/18/2012   Neck pain 12/03/2010   Hearing loss 12/03/2010   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   HYPERCHOLESTEROLEMIA 10/20/2010   DEPRESSION 10/20/2010   Osteoarthritis 10/20/2010   ARTHRITIS 10/20/2010   NEPHROLITHIASIS, HX OF 10/20/2010   ESSENTIAL HYPERTENSION, BENIGN 09/30/2010   CORONARY ATHEROSCLEROSIS NATIVE CORONARY ARTERY 09/30/2010   Sleep apnea 09/30/2010   CHEST PAIN UNSPECIFIED 09/30/2010   PERCUTANEOUS TRANSLUMINAL CORONARY ANGIOPLASTY, HX OF 09/30/2010    Immunization History  Administered Date(s) Administered   Fluad Quad(high Dose 65+) 06/12/2022  Influenza Inj Mdck Quad Pf 06/02/2018   Influenza Whole 09/17/2010   Influenza,inj,Quad PF,6+ Mos 06/08/2013, 05/22/2014, 05/12/2016, 05/08/2019, 05/27/2020   Influenza-Unspecified 06/08/2013, 05/22/2014, 06/08/2015, 05/12/2016, 05/21/2017, 06/02/2018, 05/09/2019, 05/21/2021   PFIZER(Purple Top)SARS-COV-2 Vaccination 11/27/2019, 12/25/2019, 08/07/2020, 05/21/2021   Pneumococcal Polysaccharide-23 09/08/2007, 02/16/2013   Td 09/07/2009   Td (Adult), 2 Lf Tetanus Toxid, Preservative Free 09/07/2009   Zoster Recombinat (Shingrix) 05/29/2022, 08/18/2022     Compliance/Adherence/Medication fill history: Care Gaps: UACR Colonoscopy (due 03/2018) AWV (due 06/09/22)   Star-Rating Drugs: Atorvastatin - PDC 100% Glimepiride - PDC 100% Metformin - PDC 100% Losartan - PDC 100%  SDOH:  (Social Determinants of Health) assessments and interventions performed:  No SDOH Interventions    Flowsheet Row Chronic Care Management from 11/04/2022 in [facility] Clinical Support from 09/22/2022 in [facility] Chronic Care Management from 11/21/2021 in [facility] Chronic Care Management from 12/20/2020 in [facility] Chronic Care Management from 11/27/2020 in [facility] Clinical Support from 09/20/2020 in [facility] [place]  SDOH Interventions        Food Insecurity Interventions Intervention Not Indicated Intervention Not Indicated Intervention Not Indicated -- -- --  Housing Interventions Intervention Not Indicated Intervention Not Indicated -- -- -- --  Transportation Interventions Intervention Not Indicated Intervention Not Indicated -- -- -- --  Utilities Interventions Intervention Not Indicated Intervention Not Indicated -- -- -- --  Depression Interventions/Treatment  -- -- -- -- -- PHQ2-9 Score <4 Follow-up Not Indicated  Financial Strain Interventions Intervention Not Indicated Intervention Not Indicated Other (Comment)  [Renew Novo Cares PAP] Other (Comment)  [Ozempic patient assistance approved] --  [Application for med assistance in process] --  Physical Activity Interventions Intervention Not Indicated Intervention Not Indicated -- -- -- --  Stress Interventions Intervention Not Indicated Intervention Not Indicated -- -- -- --  Social Connections Interventions Intervention Not Indicated Intervention Not Indicated -- -- -- --      SDOH Screenings   Food Insecurity: No Food Insecurity (11/04/2022)  Housing: Low Risk  (11/04/2022)  Transportation Needs: No Transportation Needs (11/04/2022)  Utilities: Not At Risk (11/04/2022)  Alcohol Screen: Low Risk  (09/20/2020)  Depression (PHQ2-9): Low Risk  (11/04/2022)  Recent Concern: Depression (PHQ2-9) - Medium Risk  (10/20/2022)  Financial Resource Strain: Low Risk  (11/04/2022)  Physical Activity: Insufficiently Active (11/04/2022)  Social Connections: Moderately Isolated (11/04/2022)  Stress: No Stress Concern Present (11/04/2022)  Tobacco Use: Medium Risk (11/04/2022)  .  Medication Assistance:  Technical sales engineer pending [phone])  Medication Access: Within the past 30 days, how often has patient missed a dose of medication? 0 Is a pillbox or other method used to improve adherence? Yes  Factors that may affect medication adherence? financial need Are meds synced by current pharmacy? Yes  Are meds delivered by current pharmacy? Yes  Does patient experience delays in picking up medications due to transportation concerns? No   [facility] Reviewed: Is patient disadvantaged to use [facility]?: No  Current Rx insurance plan: [employer] Name and location of Current pharmacy:  [facility] - [place], [place] - [address] [place] [address] Phone: [phone] Fax: [phone]  [facility] services reviewed with patient today?: Yes  Packs (last delivery 10/02/22 x 30 ds) Amlodipine 10mg - 1 tablet daily (1 breakfast)  Aspirin 81mg - 1 tablet daily (breakfast) Metformin 500mg  1 tablet twice daily (1 tablet  breakfast, 1 tablet evening meal) Carvedilol 6.25mg - 1 tablet twice daily (1 breakfast, 1 evening meal) Isosorbide 30mg - 1 tablet daily (1 bedtime) Tamsulosin 0.4mg  2 capsule daily (1 breakfast, 1 bedtime)   Losartan 100 mg- 1 tablet daily- (1 bedtime) Atorvastatin 40mg - 1 tablet daily (1 breakfast) Glimepiride 4mg - 1 tablet twice daily (1 breakfast, 1 evening meal) Pantoprazole 40 mg- 1 tablet daily (1 breakfast)  Vial medications: Gabapentin 300 mg - 1 - 2 in the PM, sedation caution TRESIBA FLEXTOUCH 100 UNIT/ML   Assessment/Plan  Diabetes (A1c goal <7%) -Not ideally controlled - A1c 8.0% (06/2022); Pt  restarted Ozempic Sun 1/14 with Evaristo Bury 20 units daily (transitioned from [place]); He is up to 22 units of [place] today, which he takes in the morning (6-7am) Reviewed AGP report: 12/18/22 to 12/31/22. Sensor active: 90%  Time in range (70-180): 64% (goal > 70%)  High (180-250): 31%  Very high (>250): 5%  Low (< 70): 0% (goal < 4%)  GMI: 7.3%; Average glucose: 166  Previous AGP report: 09/29/22 to 10/12/22. Sensor active: 97%  Time in range (70-180): 63% (goal > 70%)  High (180-250): 33%  Very high (>250): 4%  Low (< 70): 0% (goal < 4%)  GMI: 7.2%; Average glucose: 161  -Current medications: Glimepiride 4 mg daily - Appropriate, Query Effective, Metformin 500 mg BID -Appropriate, Query Effective, Tresiba 22 units daily AM -Appropriate, Query Effective, Ozempic 0.5 mg weekly Sun - Appropriate, Query Effective, Freestyle Libre 3 ([place]) -Medications previously tried: Novolin N 20 u BID, Soliqua (not as effective), Fiasp -Reviewed AGP report, pt is dropping low 1-2pm nearly every day after peaking high before noon; pt denies activity or medication around this time that would explain sudden drop; -Advised pt to eat carbs with lunch (~12pm) to try to avoid afternoon lows -Recommend to hold AM dose of glimepiride to try to prevent midday hypoglycemia; advised to wait 2-3 days between insulin adjustments to avoid titrating too quickly   Hypertension / CKD Stage 3b (BP goal <130/80) -Controlled - BP at goal in office  -Current treatment: Amodipine 5 mg daily - Appropriate, Effective, Safe, Accessible Carvedilol 6.25 mg BID -Appropriate, Effective, Safe, Accessible Isosorbide MN 30 mg HS -Appropriate, Effective, Safe, Accessible Losartan 100 mg daily -Appropriate, Effective, Safe, Accessible -Medications previously tried: n/a  -Educated on BP goals and benefits of medications for prevention of heart attack, stroke and kidney damage; -Counseled to monitor BP at home periodically,  document, and provide log at future appointments -Recommended to continue current medication   Hyperlipidemia / CAD (LDL goal < 70) -Controlled - LDL 59 -Hx CAD. NSTEMI 06/2014 -Current treatment: Atorvastatin 40 mg daily -Appropriate, Effective, Safe, Accessible Aspirin 81 mg daily -Appropriate, Effective, Safe, Accessible Isosorbide MN 30 mg daily -Appropriate, Effective, Safe, Accessible Nitroglycerin 0.4 mg SL prn -Appropriate, Effective, Safe, Accessible -Medications previously tried: none  -Recommended to continue current medication  Chronic Kidney Disease Stage 3b  -All medications assessed for renal dosing and appropriateness in chronic kidney disease. -Recommended to continue current medication   Chronic pain (Goal: manage symptoms) -Controlled -cervical radiculitis, sciatica, osteoarthritis. Injections from other did not help, going to pain mgmt. -Current treatment  Oxycodone-APAP 5-325 mg - Appropriate, Effective, Safe, Accessible Cyclobenzaprine 5 mg TID prn -Appropriate, Effective, Safe, Accessible Gabapentin 300 mg -Appropriate, Effective, Safe, Accessible -Medications previously tried: n/a  -Recommended to continue current medication   Health Maintenance -Vaccine  gaps: Shingrix       Al Corpus, PharmD, BCACP Clinical Pharmacist [facility] [phone]

## 2022-12-31 NOTE — Telephone Encounter (Signed)
Care Management & Coordination Services Outreach Note  12/31/2022 Name: Tony Manning MRN: [id number] DOB: July 28, 1961  Referred by: Joaquim Nam, MD  Patient had a phone appointment scheduled with clinical pharmacist today.  An unsuccessful telephone outreach was attempted today. The patient was referred to the pharmacist for assistance with medications, care management and care coordination.   Patient will NOT be penalized in any way for missing a Care Management & Coordination Services appointment. The no-show fee does not apply.  If possible, a message was left to return call to: [phone] or to [facility].  Al Corpus, PharmD, BCACP Clinical Pharmacist [facility] at [facility] [phone]

## 2023-01-04 ENCOUNTER — Telehealth: Payer: Self-pay | Admitting: Family Medicine

## 2023-01-04 NOTE — Telephone Encounter (Signed)
Patient gets Libre 3 through DME, it may be cheaper through the pharmacy. Will check with prior auth team.

## 2023-01-04 NOTE — Telephone Encounter (Signed)
Patient called in and stated that he will have to stop using the [place] 3 system. He stated that they are wanting to much money and he can't afford it right. Thank you!

## 2023-01-05 ENCOUNTER — Telehealth: Payer: Self-pay

## 2023-01-05 NOTE — Telephone Encounter (Cosign Needed)
Patient has been scheduled for a telephone follow-up appointment with Lindsey Foltanski, PharmD on Jan 07, 2023 at 8:15.  Lindsey Foltanski, PharmD notified  Kyona Chauncey, RMA Clinical Pharmacy Assistant [phone]   

## 2023-01-05 NOTE — Progress Notes (Signed)
Patient has been scheduled for a telephone follow-up appointment with Al Corpus, PharmD on Jan 07, 2023 at 8:15.  Al Corpus, PharmD notified  Claudina Lick, [place] Clinical Pharmacy Assistant [phone]

## 2023-01-06 ENCOUNTER — Other Ambulatory Visit (HOSPITAL_COMMUNITY): Payer: Self-pay

## 2023-01-06 NOTE — Telephone Encounter (Signed)
Pharmacy Patient Advocate Encounter  Insurance verification completed.    The patient is insured through Teaching laboratory technician for: [employer] 3 Sensor.  Pharmacy benefit copay: $26.73   **This test claim was processed through [facility]- copay amounts may vary at other pharmacies due to pharmacy/plan contracts, or as the patient moves through the different stages of their insurance plan.**

## 2023-01-07 ENCOUNTER — Ambulatory Visit: Payer: Medicare HMO | Admitting: Pharmacist

## 2023-01-07 DIAGNOSIS — E1149 Type 2 diabetes mellitus with other diabetic neurological complication: Secondary | ICD-10-CM

## 2023-01-07 MED ORDER — FREESTYLE LIBRE 3 SENSOR MISC: 11 refills | Status: DC

## 2023-01-07 NOTE — Telephone Encounter (Signed)
Noted  

## 2023-01-07 NOTE — Progress Notes (Signed)
Care Management & Coordination Services Pharmacy Note  01/07/2023 Name:  NORAH FICK MRN:  [id number] DOB:  [birth year]  Summary: F/U visit -DM: A1c 8.3% (09/2022); Pt has restarted Ozempic through PAP and glucose control is clearly improved per CGM; historically he had issues with hypoglycemia in afternoon (1-3pm) while active, this is improved somewhat now Reviewed AGP report: 12/25/22 to 01/07/23. Sensor active: 96%  Time in range (70-180): 61% (goal > 70%)  High (180-250): 33%  Very high (>250): 6%  Low (< 70): 0% (goal < 4%)  GMI: 7.4%; Average glucose: 171   Recommendations: -No med changes; recommend PCP f/u - pt to schedule when able  Follow up plan: -Pharmacist follow up televisit scheduled for 2 weeks -PCP visit due May-June 2024 (3-mo)   Subjective: PHENG PROKOP is an 62 y.o. year old male who is a primary patient of Para March, Dwana Curd, MD.  The care coordination team was consulted for assistance with disease management and care coordination needs.    Engaged with patient by telephone for follow up visit.  [name], [name], [name], [facility] as Pharmacist (Pharmacist) Audrie Gallus, RN as [facility] Care Management  Recent office visits: 10/20/22 Dr Para March OV: f/u - update glimepiride 4 mg once daily; Tresiba 20 units daily. Increase Vit D to 4000 IU daily. Rx b12 1000 mcg daily.  09/11/22 Dr Para March OV: f/u - A1c 8.3%. Increase VitD to 4000 IU daily. Will change Soliqua back to Ozempic  08/13/22 Dr Para March OV: viral URI - covid negative. Rx doxycycline. Try work 4-5 hrs per shift.    08/04/22 Dr Para March OV: Concussion f/u - brain rest. Avoid screens. Don't drive. F/u 3 weeks.   07/20/22 Dr Patsy Lager OV: f/u concussion - still very symptomatic. Remain out of work x next 3 weeks. Refrain from driving. Limit screen use. RTC 3 weeks.    07/06/22 Dr Patsy Lager OV: concussion, fall at work. Hold out of work and limit physical activity - no driving, using computers or smart phones. F/u 2 weeks.   06/29/22 Dr Para March OV: DM - lows in PM. Reduce Soliqua to 40 units.   06/19/22 Dr Para March OV: f/u - waiting on meter replacement. Leg pain - Increase gabapentin to 300 mg AM and 600 mg PM.   06/12/22 Dr Para March OV: f/u - A1c 8.0%; update about sugars in 2-3 weeks  Recent consult visits: 12/03/22 PharmD: pt using Ozempic 1 mg.  05/06/22 Dr [name] (Cardiology): f/u HF, doing well, no changes.   02/16/22 NP Tammy Parrett (pulmonary): f/u Sarcoid. D/c pregabalin. D/c lasix, Kcl. Ordered PFTs.   01/05/22 PA Elvin So (cardiology): SOB/edema. Rx'd furosemide 20 mg and Kcl 10 mEq. Reduced amlodipine to 5 mg.   10/02/21 Dr Jacquelyne Balint (psychology): chronic pain  - stable for spinal cord stimulator trial  Hospital visits: None in previous 6 months   Objective:  Lab Results  Component Value Date   CREATININE 1.78 (H) 09/11/2022   BUN 21 09/11/2022   GFR 47.53 (L) 01/09/2022   GFRNONAA 54 (L) 11/12/2020   GFRAA 42 (L) 05/05/2019   NA 142 09/11/2022   K 4.5 09/11/2022   CALCIUM 9.7 09/11/2022   CO2 23 09/11/2022   GLUCOSE 114 (H) 09/11/2022    Lab Results  Component Value Date/Time   HGBA1C 8.3 (H) 09/11/2022 04:02 PM   HGBA1C 8.0 (A) 06/12/2022 07:57  AM   HGBA1C 7.2 (H) 12/23/2021 11:47 AM   GFR 47.53 (L) 01/09/2022 12:05 PM   GFR 60.28 12/23/2021 11:47 AM   MICROALBUR 1.4 09/11/2022 04:02 PM   MICROALBUR 1.5 08/15/2012 09:05 AM    Last diabetic Eye exam:  Lab Results  Component Value Date/Time   HMDIABEYEEXA Retinopathy (A) 05/15/2022 12:00 AM    Last diabetic Foot exam: No results found for: "HMDIABFOOTEX"   Lab Results  Component Value Date   CHOL 103 09/11/2022   HDL 38 (L) 09/11/2022   LDLCALC 44 09/11/2022   TRIG 127 09/11/2022   CHOLHDL 2.7 09/11/2022       Latest Ref Rng & Units 09/11/2022    4:02 PM  06/09/2021    9:23 AM 01/27/2021   12:40 PM  Hepatic Function  Total Protein 6.1 - 8.1 g/dL 7.0  6.8  7.5   Albumin 3.5 - 5.2 g/dL  4.3  4.8   AST 10 - 35 U/L 22  18  20    ALT 9 - 46 U/L 22  23  21    Alk Phosphatase 39 - 117 U/L  90  78   Total Bilirubin 0.2 - 1.2 mg/dL 0.5  0.5  0.6   Bilirubin, Direct 0.0 - 0.3 mg/dL   0.1     Lab Results  Component Value Date/Time   TSH 1.34 01/27/2021 12:40 PM   TSH 2.22 03/21/2018 12:05 PM       Latest Ref Rng & Units 09/11/2022    4:02 PM 12/23/2021   11:47 AM 06/09/2021    9:23 AM  CBC  WBC 3.8 - 10.8 Thousand/uL 5.8  5.9  7.2   Hemoglobin 13.2 - 17.1 g/dL 16.1  09.6  04.5   Hematocrit 38.5 - 50.0 % 38.6  39.6  40.9   Platelets 140 - 400 Thousand/uL 214  193.0  198.0     Lab Results  Component Value Date/Time   VD25OH 29 (L) 09/11/2022 04:02 PM   VD25OH 23.11 (L) 12/23/2021 11:47 AM   VD25OH 19.37 (L) 03/21/2018 12:05 PM   VITAMINB12 1,316 (H) 09/11/2022 04:02 PM   VITAMINB12 290 12/23/2021 11:47 AM    Clinical ASCVD: Yes  The ASCVD Risk score (Arnett DK, et al., 2019) failed to calculate for the following reasons:   The patient has a prior MI or stroke diagnosis       11/04/2022   10:22 AM 10/20/2022    2:28 PM 09/22/2022    9:16 AM  Depression screen PHQ 2/9  Decreased Interest 0 1 0  Down, Depressed, Hopeless 0 0 0  PHQ - 2 Score 0 1 0  Altered sleeping  2   Tired, decreased energy  1   Change in appetite  0   Feeling bad or failure about yourself   0   Trouble concentrating  1   Moving slowly or fidgety/restless  0   Suicidal thoughts  0   PHQ-9 Score  5   Difficult doing work/chores  Somewhat difficult     Social History   Tobacco Use  Smoking Status Never  Smokeless Tobacco Former   Types: Chew   Quit date: 02/06/2016   BP Readings from Last 3 Encounters:  10/20/22 (!) 102/52  09/11/22 104/60  08/13/22 118/62   Pulse Readings from Last 3 Encounters:  10/20/22 70  09/11/22 67  08/13/22 68   Wt Readings  from Last 3 Encounters:  10/20/22 214 lb (97.1 kg)  09/22/22 215 lb (97.5  kg)  09/11/22 215 lb (97.5 kg)   BMI Readings from Last 3 Encounters:  10/20/22 30.71 kg/m  09/22/22 30.85 kg/m  09/11/22 30.85 kg/m    Allergies  Allergen Reactions   Ambien [Zolpidem] Other (See Comments)    Parasomnias, sleep walking   Lisinopril Cough   Tramadol Diarrhea    Medications Reviewed Today     Reviewed by Kathyrn Sheriff, [facility] (Pharmacist) on 01/07/23 at [id number]  Med List Status: <None>   Medication Order Taking? Sig Documenting Provider Last Dose Status Informant  amLODipine (NORVASC) 5 MG tablet [id number] Yes Take 1 tablet (5 mg total) by mouth every morning. Cantwell, Celeste C, PA-C Taking Active   aspirin 81 MG chewable tablet [id number] Yes Chew 81 mg by mouth at bedtime. [provider] Taking Active Self  atorvastatin (LIPITOR) 40 MG tablet [id number] Yes TAKE ONE TABLET BY MOUTH EVERY MORNING Joaquim Nam, MD Taking Active   Blood Glucose Monitoring Suppl [facility] VERIO) w/Device KIT [id number] Yes Use to check blood sugars TID. Dx: E11.9 Joaquim Nam, MD Taking Active   carvedilol (COREG) 6.25 MG tablet [id number] Yes TAKE ONE TABLET BY MOUTH TWICE DAILY Yates Decamp, MD Taking Active   Cholecalciferol (VITAMIN D3) 50 MCG (2000 UT) CAPS [id number] Yes Take 2 capsules (4,000 Units total) by mouth daily. Joaquim Nam, MD Taking Active   Continuous Glucose Sensor (FREESTYLE LIBRE 3 [place]) [place] [id number] Yes Apply sensor every 14 days as directed to monitor glucose continuously Joaquim Nam, MD Taking Active   cyanocobalamin (VITAMIN B12) 1000 MCG tablet [id number] Yes Take 1 tablet (1,000 mcg total) by mouth daily. Joaquim Nam, MD Taking Active   cyclobenzaprine (FLEXERIL) 5 MG tablet [id number] Yes Take 1 tablet (5 mg total) by mouth 3 (three) times daily as needed for muscle spasms (sedation caution). Joaquim Nam, MD Taking Active            Med Note  Littie Deeds, [facility] A   Thu Nov 05, 2022  9:15 AM) Taking 1 in the evening  diclofenac Sodium (VOLTAREN) 1 % GEL [id number] Yes Apply 2-4 g topically 4 (four) times daily. Joaquim Nam, MD Taking Active   gabapentin (NEURONTIN) 300 MG capsule [id number] Yes 1-2 in the PM, sedation caution. Joaquim Nam, MD Taking Active   glimepiride (AMARYL) 4 MG tablet [id number] Yes Take 1 tablet (4 mg total) by mouth daily with breakfast. Joaquim Nam, MD Taking Active   insulin degludec (TRESIBA FLEXTOUCH) 100 UNIT/ML FlexTouch Pen [id number] Yes Inject 20 Units into the skin daily. Joaquim Nam, MD Taking Active   isosorbide mononitrate (IMDUR) 30 MG 24 hr tablet [id number] Yes TAKE ONE TABLET BY MOUTH EVERYDAY AT BEDTIME Yates Decamp, MD Taking Active   losartan (COZAAR) 100 MG tablet [id number] Yes TAKE ONE TABLET BY MOUTH EVERYDAY AT BEDTIME Yates Decamp, MD Taking Active   metFORMIN (GLUCOPHAGE) 500 MG tablet [id number] Yes TAKE ONE TABLET BY MOUTH TWICE DAILY Joaquim Nam, MD Taking Active   nitroGLYCERIN (NITROSTAT) 0.4 MG SL tablet [id number] Yes Place 1 tablet (0.4 mg total) under the tongue every 5 (five) minutes as needed for chest pain. Toniann Fail, NP Taking Active   ondansetron [facility]) 4 MG tablet [id number] Yes Take 4 mg by mouth every 8 (eight) hours as needed. [provider] Taking Active   oxyCODONE-acetaminophen (PERCOCET/ROXICET) 5-325 MG tablet [id number] Yes Take 1 tablet by mouth 4 (four) times daily. [provider] Taking Active  Med Note Littie Deeds, CHERYL A   Thu Nov 05, 2022  9:18 AM) Takes half tablet daily  pantoprazole (PROTONIX) 40 MG tablet [id number] Yes TAKE ONE TABLET BY MOUTH EVERY MORNING Joaquim Nam, MD Taking Active   Semaglutide, 1 MG/DOSE, (OZEMPIC, 1 MG/DOSE,) 4 MG/3ML SOPN [id number] Yes Inject 1 mg into the skin once a week. [provider] Taking Active Self           Med Note Littie Deeds, CHERYL A   Thu Dec 03, 2022   9:32 AM) PAP medication  tamsulosin (FLOMAX) 0.4 MG CAPS capsule [id number] Yes TAKE ONE CAPSULE BY MOUTH AT BREAKFAST AND AT BEDTIME Joaquim Nam, MD Taking Active             Patient Active Problem List   Diagnosis Date Noted   URI (upper respiratory infection) 08/16/2022   Concussion 08/05/2022   Acute cough 02/05/2022   Near syncope 12/25/2021   COVID-19 11/14/2021   Advance care planning 06/13/2021   Insomnia 06/13/2021   B12 deficiency 01/30/2021   Fatigue 01/29/2021   Abdominal discomfort 01/05/2021   SOB (shortness of breath) 11/12/2020   Sarcoidosis 11/12/2020   H/O colonoscopy 10/27/2020   Nocturia 05/03/2020   Right sided weakness 03/06/2020   Low back pain 01/29/2020   Facial weakness 11/16/2019   Hematuria 03/22/2018   Renal stones 03/22/2018   Vitamin D deficiency, unspecified 10/04/2017   Chronic neck pain (Primary Area of Pain) (B) (L>R) 09/21/2017   Chronic upper extremity pain (Secondary Area of Pain) (Bilateral) (L>R) 09/21/2017   Chronic left shoulder pain (Tertiary Area of Pain) 09/21/2017   Chronic low back pain (Fourth Area of Pain) (B) (R>L) 09/21/2017   Right leg pain 09/21/2017   Other long term (current) drug therapy 09/21/2017   Other specified health status 09/21/2017   Other chronic pain 08/22/2017   Other fatigue 05/10/2017   Radiculopathy 04/07/2017   Angina pectoris (HCC) 10/15/2016   Cervical radiculitis 07/23/2016   Dysphagia 06/12/2016   Sciatica 02/06/2016   Plantar fasciitis 01/16/2016   Right sided sciatica 05/16/2015   Hand pain 04/05/2015   Premature ejaculation 09/17/2014   Heel pain 09/17/2014   NSTEMI (non-ST elevated myocardial infarction) (HCC) 06/13/2014   Atypical chest pain 06/06/2014   Memory change 05/24/2014   Benign paroxysmal positional vertigo 07/13/2013   Orthostasis 07/13/2013   Migraine, unspecified, without mention of intractable migraine without mention of status migrainosus 04/03/2013   Pain in  joint, ankle and foot 10/19/2012   Irritable mood 01/18/2012   Neck pain 12/03/2010   Hearing loss 12/03/2010   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   HYPERCHOLESTEROLEMIA 10/20/2010   DEPRESSION 10/20/2010   Osteoarthritis 10/20/2010   ARTHRITIS 10/20/2010   NEPHROLITHIASIS, HX OF 10/20/2010   ESSENTIAL HYPERTENSION, BENIGN 09/30/2010   CORONARY ATHEROSCLEROSIS NATIVE CORONARY ARTERY 09/30/2010   Sleep apnea 09/30/2010   CHEST PAIN UNSPECIFIED 09/30/2010   PERCUTANEOUS TRANSLUMINAL CORONARY ANGIOPLASTY, HX OF 09/30/2010    Immunization History  Administered Date(s) Administered   Fluad Quad(high Dose 65+) 06/12/2022   Influenza Inj Mdck Quad Pf 06/02/2018   Influenza Whole 09/17/2010   Influenza,inj,Quad PF,6+ Mos 06/08/2013, 05/22/2014, 05/12/2016, 05/08/2019, 05/27/2020   Influenza-Unspecified 06/08/2013, 05/22/2014, 06/08/2015, 05/12/2016, 05/21/2017, 06/02/2018, 05/09/2019, 05/21/2021   PFIZER(Purple Top)SARS-COV-2 Vaccination 11/27/2019, 12/25/2019, 08/07/2020, 05/21/2021   Pneumococcal Polysaccharide-23 09/08/2007, 02/16/2013   Td 09/07/2009   Td (Adult), 2 Lf Tetanus Toxid, Preservative Free 09/07/2009   Zoster Recombinat (Shingrix) 05/29/2022, 08/18/2022    SDOH:  (  Social Determinants of Health) assessments and interventions performed: No SDOH Interventions    Flowsheet Row Chronic Care Management from 11/04/2022 in [facility] Clinical Support from 09/22/2022 in [facility] at [facility] Chronic Care Management from 11/21/2021 in [facility] at [facility] Chronic Care Management from 12/20/2020 in [facility] at [facility] Chronic Care Management from 11/27/2020 in [facility] at [facility] Clinical Support from 09/20/2020 in [facility] at [place]  SDOH Interventions        Food Insecurity Interventions  Intervention Not Indicated Intervention Not Indicated Intervention Not Indicated -- -- --  Housing Interventions Intervention Not Indicated Intervention Not Indicated -- -- -- --  Transportation Interventions Intervention Not Indicated Intervention Not Indicated -- -- -- --  Utilities Interventions Intervention Not Indicated Intervention Not Indicated -- -- -- --  Depression Interventions/Treatment  -- -- -- -- -- PHQ2-9 Score <4 Follow-up Not Indicated  Financial Strain Interventions Intervention Not Indicated Intervention Not Indicated Other (Comment)  [Renew Novo Cares PAP] Other (Comment)  [Ozempic patient assistance approved] --  [Application for med assistance in process] --  Physical Activity Interventions Intervention Not Indicated Intervention Not Indicated -- -- -- --  Stress Interventions Intervention Not Indicated Intervention Not Indicated -- -- -- --  Social Connections Interventions Intervention Not Indicated Intervention Not Indicated -- -- -- --      SDOH Screenings   Food Insecurity: No Food Insecurity (11/04/2022)  Housing: Low Risk  (11/04/2022)  Transportation Needs: No Transportation Needs (11/04/2022)  Utilities: Not At Risk (11/04/2022)  Alcohol Screen: Low Risk  (09/20/2020)  Depression (PHQ2-9): Low Risk  (11/04/2022)  Recent Concern: Depression (PHQ2-9) - Medium Risk (10/20/2022)  Financial Resource Strain: Low Risk  (11/04/2022)  Physical Activity: Insufficiently Active (11/04/2022)  Social Connections: Moderately Isolated (11/04/2022)  Stress: No Stress Concern Present (11/04/2022)  Tobacco Use: Medium Risk (11/04/2022)  .  Medication Assistance:  Technical sales engineer pending [phone])  Medication Access: Within the past 30 days, how often has patient missed a dose of medication? 0 Is a pillbox or other method used to improve adherence? Yes  Factors that may affect medication adherence? financial need Are meds synced by current pharmacy? Yes  Are meds  delivered by current pharmacy? Yes  Does patient experience delays in picking up medications due to transportation concerns? No   [facility] Reviewed: Is patient disadvantaged to use [facility]?: No  Current Rx insurance plan: Community education officer Name and location of Current pharmacy:  [facility] - [place], [place] - [address] [place] [address] Phone: [phone] Fax: [phone]  [facility] services reviewed with patient today?: Yes  Packs (last delivery 10/02/22 x 30 ds) Amlodipine 10mg - 1 tablet daily (1 breakfast) Aspirin 81mg - 1 tablet daily (breakfast) Metformin 500mg  1 tablet twice daily (1 tablet  breakfast, 1 tablet evening meal) Carvedilol 6.25mg - 1 tablet twice daily (1 breakfast, 1 evening meal) Isosorbide 30mg - 1 tablet daily (1 bedtime) Tamsulosin 0.4mg  2 capsule daily (1 breakfast, 1 bedtime)   Losartan 100 mg- 1 tablet daily- (1 bedtime) Atorvastatin 40mg - 1 tablet daily (1 breakfast) Glimepiride 4mg - 1 tablet twice daily (1 breakfast, 1 evening meal) Pantoprazole 40 mg- 1 tablet daily (1 breakfast)  Vial medications: Gabapentin 300 mg - 1 - 2 in the PM, sedation caution TRESIBA FLEXTOUCH 100 UNIT/ML    Compliance/Adherence/Medication fill history:  Care Gaps: Colonoscopy (due 03/2018)   Star-Rating Drugs: Atorvastatin - PDC 97% Glimepiride - PDC 97% Metformin - PDC 97% Losartan - PDC 97%   ASSESSMENT / PLAN  Diabetes (A1c goal <7%) -Not ideally controlled - A1c 8.3% (09/2022); Pt has restarted Ozempic through PAP and glucose control is much improved per CGM Reviewed AGP report: 12/25/22 to 01/07/23. Sensor active: 96%  Time in range (70-180): 61% (goal > 70%)  High (180-250): 33%  Very high (>250): 6%  Low (< 70): 0% (goal < 4%)  GMI: 7.4%; Average glucose: 171  Previous AGP report: 09/29/22 to 10/12/22. Sensor active: 97%  Time in range (70-180): 63% (goal > 70%)  High (180-250):  33%  Very high (>250): 4%  Low (< 70): 0% (goal < 4%)  GMI: 7.2%; Average glucose: 161  -Current medications: Glimepiride 4 mg daily - Appropriate, Query Effective, Metformin 500 mg BID -Appropriate, Query Effective, Tresiba 22 units daily AM -Appropriate, Query Effective, Ozempic 1 mg weekly Sun - Appropriate, Query Effective, Freestyle Libre 3 ([place]) -Medications previously tried: Novolin N 20 u BID, Soliqua (not as effective), Fiasp -Advised pt to eat carbs with lunch (~12pm) to try to avoid afternoon lows -Recommend to continue current medication; consider increasing Ozempic to 2 mg, possibly d/c glimepiride in future   Hypertension / CKD Stage 3b (BP goal <130/80) -Controlled - BP at goal in office  -Current treatment: Amodipine 5 mg daily - Appropriate, Effective, Safe, Accessible Carvedilol 6.25 mg BID -Appropriate, Effective, Safe, Accessible Isosorbide MN 30 mg HS -Appropriate, Effective, Safe, Accessible Losartan 100 mg daily -Appropriate, Effective, Safe, Accessible -Medications previously tried: n/a  -Educated on BP goals and benefits of medications for prevention of heart attack, stroke and kidney damage; -Counseled to monitor BP at home periodically, document, and provide log at future appointments -Recommended to continue current medication   Hyperlipidemia / CAD (LDL goal < 70) -Controlled - LDL 59 -Hx CAD. NSTEMI 06/2014 -Current treatment: Atorvastatin 40 mg daily -Appropriate, Effective, Safe, Accessible Aspirin 81 mg daily -Appropriate, Effective, Safe, Accessible Isosorbide MN 30 mg daily -Appropriate, Effective, Safe, Accessible Nitroglycerin 0.4 mg SL prn -Appropriate, Effective, Safe, Accessible -Medications previously tried: none  -Recommended to continue current medication  Chronic Kidney Disease Stage 3b  -All medications assessed for renal dosing and appropriateness in chronic kidney disease. -Recommended to continue current medication   Chronic  pain (Goal: manage symptoms) -Controlled -cervical radiculitis, sciatica, osteoarthritis. Injections from other did not help, going to pain mgmt. -Current treatment  Oxycodone-APAP 5-325 mg - Appropriate, Effective, Safe, Accessible Cyclobenzaprine 5 mg TID prn -Appropriate, Effective, Safe, Accessible Gabapentin 300 mg -Appropriate, Effective, Safe, Accessible -Medications previously tried: n/a  -Recommended to continue current medication   Health Maintenance -Vaccine gaps: Shingrix       Al Corpus, PharmD, BCACP Clinical Pharmacist [facility] [phone]

## 2023-01-19 ENCOUNTER — Telehealth: Payer: Self-pay

## 2023-01-19 DIAGNOSIS — M65311 Trigger thumb, right thumb: Secondary | ICD-10-CM | POA: Diagnosis not present

## 2023-01-19 NOTE — Progress Notes (Signed)
Care Management & Coordination Services Pharmacy Team  Reason for Encounter: Medication Coordination and Delivery  Contacted patient to discuss medications and coordinate delivery from [facility].  {US HC Outreach:28874}  Cycle dispensing form sent to {Blank multiple:19196::"Lindsay Foltanski, PharmD","Lindsey Saintsing, CTL"} for review.   Last adherence delivery date: 12/31/2022      Patient is due for next adherence delivery on: 02/01/2023  This delivery to include: Adherence Packaging  30 Days  Packs: Amlodipine 5 mg- 1 tablet daily (1 breakfast) Aspirin 81mg - 1 tablet daily (breakfast) Metformin 500mg  1 tablet twice daily (1 tablet  breakfast, 1 tablet evening meal) Carvedilol 6.25mg - 1 tablet twice daily (1 breakfast, 1 evening meal) Isosorbide 30mg - 1 tablet daily (1 bedtime) Tamsulosin 0.4mg  2 capsule daily (1 breakfast, 1 bedtime)   Losartan 100 mg- 1 tablet daily- (1 bedtime) Atorvastatin 40mg - 1 tablet daily (1 breakfast) Glimepiride 4mg - 1 tablet daily (1 breakfast) Pantoprazole 40 mg- 1 tablet daily (1 breakfast)   Vial medications: None at this time    Receives from Manufacturer: Ozempic   Patient declined the following medications this month: ***  Refills requested from providers include: Complete Amlodipine 5 mg- 1 tablet daily (1 breakfast)  {Delivery ZOXW:96045} 02/01/2023  Any concerns about your medications? {yes/no:20286}  How often do you forget or accidentally miss a dose? Rarely  Do you use a pillbox? No  Is patient in packaging {yes/no:20286}  If yes  What is the date on your next pill pack?  Any concerns or issues with your packaging?  Recent blood glucose readings are as follows: Patient stated Al Corpus, PharmD has access to his readings.    Chart review: Recent office visits:  None since last contact  Recent consult visits:  None since last contact  Hospital visits:  None in previous 6  months  Medications: Outpatient Encounter Medications as of 01/19/2023  Medication Sig Note   amLODipine (NORVASC) 5 MG tablet Take 1 tablet (5 mg total) by mouth every morning.    aspirin 81 MG chewable tablet Chew 81 mg by mouth at bedtime.    atorvastatin (LIPITOR) 40 MG tablet TAKE ONE TABLET BY MOUTH EVERY MORNING    Blood Glucose Monitoring Suppl (ONETOUCH VERIO) w/Device KIT Use to check blood sugars TID. Dx: E11.9    carvedilol (COREG) 6.25 MG tablet TAKE ONE TABLET BY MOUTH TWICE DAILY    Cholecalciferol (VITAMIN D3) 50 MCG (2000 UT) CAPS Take 2 capsules (4,000 Units total) by mouth daily.    Continuous Glucose Sensor (FREESTYLE LIBRE 3 SENSOR) MISC Apply sensor every 14 days as directed to monitor glucose continuously    cyanocobalamin (VITAMIN B12) 1000 MCG tablet Take 1 tablet (1,000 mcg total) by mouth daily.    cyclobenzaprine (FLEXERIL) 5 MG tablet Take 1 tablet (5 mg total) by mouth 3 (three) times daily as needed for muscle spasms (sedation caution). 11/05/2022: Taking 1 in the evening   diclofenac Sodium (VOLTAREN) 1 % GEL Apply 2-4 g topically 4 (four) times daily.    gabapentin (NEURONTIN) 300 MG capsule 1-2 in the PM, sedation caution.    glimepiride (AMARYL) 4 MG tablet Take 1 tablet (4 mg total) by mouth daily with breakfast.    insulin degludec (TRESIBA FLEXTOUCH) 100 UNIT/ML FlexTouch Pen Inject 20 Units into the skin daily.    isosorbide mononitrate (IMDUR) 30 MG 24 hr tablet TAKE ONE TABLET BY MOUTH EVERYDAY AT BEDTIME    losartan (COZAAR) 100 MG tablet TAKE ONE TABLET BY MOUTH EVERYDAY AT BEDTIME  metFORMIN (GLUCOPHAGE) 500 MG tablet TAKE ONE TABLET BY MOUTH TWICE DAILY    nitroGLYCERIN (NITROSTAT) 0.4 MG SL tablet Place 1 tablet (0.4 mg total) under the tongue every 5 (five) minutes as needed for chest pain.    ondansetron (ZOFRAN) 4 MG tablet Take 4 mg by mouth every 8 (eight) hours as needed.    oxyCODONE-acetaminophen (PERCOCET/ROXICET) 5-325 MG tablet Take 1  tablet by mouth 4 (four) times daily. 11/05/2022: Takes half tablet daily   pantoprazole (PROTONIX) 40 MG tablet TAKE ONE TABLET BY MOUTH EVERY MORNING    Semaglutide, 1 MG/DOSE, (OZEMPIC, 1 MG/DOSE,) 4 MG/3ML SOPN Inject 1 mg into the skin once a week. 12/03/2022: PAP medication   tamsulosin (FLOMAX) 0.4 MG CAPS capsule TAKE ONE CAPSULE BY MOUTH AT BREAKFAST AND AT BEDTIME    No facility-administered encounter medications on file as of 01/19/2023.   BP Readings from Last 3 Encounters:  10/20/22 (!) 102/52  09/11/22 104/60  08/13/22 118/62    Pulse Readings from Last 3 Encounters:  10/20/22 70  09/11/22 67  08/13/22 68    Lab Results  Component Value Date/Time   HGBA1C 8.3 (H) 09/11/2022 04:02 PM   HGBA1C 8.0 (A) 06/12/2022 07:57 AM   HGBA1C 7.2 (H) 12/23/2021 11:47 AM   Lab Results  Component Value Date   CREATININE 1.78 (H) 09/11/2022   BUN 21 09/11/2022   GFR 47.53 (L) 01/09/2022   GFRNONAA 54 (L) 11/12/2020   GFRAA 42 (L) 05/05/2019   NA 142 09/11/2022   K 4.5 09/11/2022   CALCIUM 9.7 09/11/2022   CO2 23 09/11/2022   Al Corpus, CPP notified   Claudina Lick, [place] Clinical Pharmacy Assistant [phone]

## 2023-01-21 ENCOUNTER — Ambulatory Visit (INDEPENDENT_AMBULATORY_CARE_PROVIDER_SITE_OTHER): Payer: Medicare HMO | Admitting: *Deleted

## 2023-01-21 ENCOUNTER — Other Ambulatory Visit: Payer: Self-pay | Admitting: Orthopedic Surgery

## 2023-01-21 DIAGNOSIS — I10 Essential (primary) hypertension: Secondary | ICD-10-CM

## 2023-01-21 DIAGNOSIS — E1149 Type 2 diabetes mellitus with other diabetic neurological complication: Secondary | ICD-10-CM

## 2023-01-21 NOTE — Chronic Care Management (AMB) (Signed)
Chronic Care Management   CCM RN Visit Note  01/21/2023 Name: Tony Manning MRN: [id number] DOB: [birth year]  Subjective: Tony Manning is a 62 y.o. year old male who is a primary care patient of Joaquim Nam, MD. The patient was referred to the Chronic Care Management team for assistance with care management needs subsequent to provider initiation of CCM services and plan of care.    Today's Visit:  Engaged with patient by telephone for follow up visit.        Goals Addressed             This Visit's Progress    CCM (DIABETES) EXPECTED OUTCOME: MONITOR, SELF- MANAGE AND REDUCE SYMPTOMS OF DIABETES       Current Barriers:  Knowledge Deficits related to Diabetes management Chronic Disease Management support and education needs related to Diabetes, diet Patient reports CBG monitored with [employer] [facility], states fasting CBG today 136, reports 7 day average is 114 but may be slightly higher going forward per pt due to cortisone injection on 01/19/23 at right thumb area Patient reports he will be getting assistance for ozempic, pharmacist continues working with pt,  pt reports he has all medications and taking as prescribed  Planned Interventions: Provided education to patient about basic DM disease process; Reviewed medications with patient and discussed importance of medication adherence;        Reviewed prescribed diet with patient carbohydrate modified; Counseled on importance of regular laboratory monitoring as prescribed;        Advised patient, providing education and rationale, to check cbg per [facility] [employer] [place]  and record        call provider for findings outside established parameters;       Review of patient status, including review of consultants reports, relevant laboratory and other test results, and medications completed;        Symptom Management: Take medications as prescribed   Attend all scheduled provider appointments Call pharmacy for medication  refills 3-7 days in advance of running out of medications Attend church or other social activities Perform all self care activities independently  Perform IADL's (shopping, preparing meals, housekeeping, managing finances) independently Call provider office for new concerns or questions  check blood sugar at prescribed times: per [facility]  check feet daily for cuts, sores or redness trim toenails straight across fill half of plate with vegetables limit fast food meals to no more than 1 per week manage portion size prepare main meal at home 3 to 5 days each week read food labels for fat, fiber, carbohydrates and portion size set a realistic goal Eat 3 meals per day Continue working with pharmacist as needed Try to get outside daily and do some type of exercise  Follow Up Plan: Telephone follow up appointment with care management team member scheduled for:   04/20/23 at 215 pm       CCM (HYPERTENSION) EXPECTED OUTCOME: MONITOR, SELF-MANAGE AND REDUCE SYMPTOMS OF HYPERTENSION       Current Barriers:  Knowledge Deficits related to Hypertension management Chronic Disease Management support and education needs related to Hypertension, diet Patient reports he lives with spouse, is independent with all aspects of his care, currently not working part time, trying to get ready for his upcoming shoulder surgery in July for repair of rotator cuff, has all medications and taking as prescribed Patient reports he has blood pressure cuff but does not monitor blood pressure Patient reports he has had several falls this past  year, one of the falls resulted in a concussion (tripped over something at work, no falls reported since last conversation  Planned Interventions: Evaluation of current treatment plan related to hypertension self management and patient's adherence to plan as established by provider;   Reviewed medications with patient and discussed importance of compliance;  Counseled on the  importance of exercise goals with target of 150 minutes per week Discussed plans with patient for ongoing care management follow up and provided patient with direct contact information for care management team; Advised patient, providing education and rationale, to monitor blood pressure daily and record, calling PCP for findings outside established parameters;  Discussed complications of poorly controlled blood pressure such as heart disease, stroke, circulatory complications, vision complications, kidney impairment, sexual dysfunction;  Reinforced low sodium diet Reinforced safety precautions  Symptom Management: Take medications as prescribed   Attend all scheduled provider appointments Call pharmacy for medication refills 3-7 days in advance of running out of medications Attend church or other social activities Perform all self care activities independently  Perform IADL's (shopping, preparing meals, housekeeping, managing finances) independently Call provider office for new concerns or questions  check blood pressure weekly choose a place to take my blood pressure (home, clinic or office, retail store) write blood pressure results in a log or diary learn about high blood pressure take blood pressure log to all doctor appointments call doctor for signs and symptoms of high blood pressure keep all doctor appointments take medications for blood pressure exactly as prescribed begin an exercise program report new symptoms to your doctor eat more whole grains, fruits and vegetables, lean meats and healthy fats Follow low sodium diet Read food labels for sodium content fall prevention strategies: change position slowly, use assistive device such as walker or cane (per provider recommendations) when walking, keep walkways clear, have good lighting in room. It is important to contact your provider if you have any falls, maintain muscle strength/tone by exercise per provider  recommendations.  Follow Up Plan: Telephone follow up appointment with care management team member scheduled for:  04/20/23 at 215 pm          Plan:Telephone follow up appointment with care management team member scheduled for:  04/20/23 at 215 pm  Irving Shows [facility], BSN RN Case Manager [facility] [place] [phone]

## 2023-01-21 NOTE — Patient Instructions (Signed)
Please call the care guide team at [phone] if you need to cancel or reschedule your appointment.   If you are experiencing a Mental Health or Behavioral Health Crisis or need someone to talk to, please call the Suicide and Crisis Lifeline: 988 call the [place] National Suicide Prevention Lifeline: 913-130-2262 or TTY: 5121450440 TTY [phone]) to talk to a trained counselor call 1-800-273-TALK (toll free, 24 hour hotline) go to [facility] [address], [place] [phone]) call 911   Following is a copy of the CCM Program Consent:  CCM service includes personalized support from designated clinical staff supervised by the physician, including individualized plan of care and coordination with other care providers 24/7 contact phone numbers for assistance for urgent and routine care needs. Service will only be billed when office clinical staff spend 20 minutes or more in a month to coordinate care. Only one practitioner may furnish and bill the service in a calendar month. The patient may stop CCM services at amy time (effective at the end of the month) by phone call to the office staff. The patient will be responsible for cost sharing (co-pay) or up to 20% of the service fee (after annual deductible is met)  Following is a copy of your full provider care plan:   Goals Addressed             This Visit's Progress    CCM (DIABETES) EXPECTED OUTCOME: MONITOR, SELF- MANAGE AND REDUCE SYMPTOMS OF DIABETES       Current Barriers:  Knowledge Deficits related to Diabetes management Chronic Disease Management support and education needs related to Diabetes, diet Patient reports CBG monitored with [employer], states fasting CBG today 136, reports 7 day average is 114 but may be slightly higher going forward per pt due to cortisone injection on 01/19/23 at right thumb area Patient reports he will be getting assistance for ozempic, pharmacist  continues working with pt,  pt reports he has all medications and taking as prescribed  Planned Interventions: Provided education to patient about basic DM disease process; Reviewed medications with patient and discussed importance of medication adherence;        Reviewed prescribed diet with patient carbohydrate modified; Counseled on importance of regular laboratory monitoring as prescribed;        Advised patient, providing education and rationale, to check cbg per [facility] [employer] [place]  and record        call provider for findings outside established parameters;       Review of patient status, including review of consultants reports, relevant laboratory and other test results, and medications completed;        Symptom Management: Take medications as prescribed   Attend all scheduled provider appointments Call pharmacy for medication refills 3-7 days in advance of running out of medications Attend church or other social activities Perform all self care activities independently  Perform IADL's (shopping, preparing meals, housekeeping, managing finances) independently Call provider office for new concerns or questions  check blood sugar at prescribed times: per [facility]  check feet daily for cuts, sores or redness trim toenails straight across fill half of plate with vegetables limit fast food meals to no more than 1 per week manage portion size prepare main meal at home 3 to 5 days each week read food labels for fat, fiber, carbohydrates and portion size set a realistic goal Eat 3 meals per day Continue working with pharmacist as needed Try to get outside daily and  do some type of exercise  Follow Up Plan: Telephone follow up appointment with care management team member scheduled for:   04/20/23 at 215 pm       CCM (HYPERTENSION) EXPECTED OUTCOME: MONITOR, SELF-MANAGE AND REDUCE SYMPTOMS OF HYPERTENSION       Current Barriers:  Knowledge Deficits related to Hypertension  management Chronic Disease Management support and education needs related to Hypertension, diet Patient reports he lives with spouse, is independent with all aspects of his care, currently not working part time, trying to get ready for his upcoming shoulder surgery in July for repair of rotator cuff, has all medications and taking as prescribed Patient reports he has blood pressure cuff but does not monitor blood pressure Patient reports he has had several falls this past year, one of the falls resulted in a concussion (tripped over something at work, no falls reported since last conversation  Planned Interventions: Evaluation of current treatment plan related to hypertension self management and patient's adherence to plan as established by provider;   Reviewed medications with patient and discussed importance of compliance;  Counseled on the importance of exercise goals with target of 150 minutes per week Discussed plans with patient for ongoing care management follow up and provided patient with direct contact information for care management team; Advised patient, providing education and rationale, to monitor blood pressure daily and record, calling PCP for findings outside established parameters;  Discussed complications of poorly controlled blood pressure such as heart disease, stroke, circulatory complications, vision complications, kidney impairment, sexual dysfunction;  Reinforced low sodium diet Reinforced safety precautions  Symptom Management: Take medications as prescribed   Attend all scheduled provider appointments Call pharmacy for medication refills 3-7 days in advance of running out of medications Attend church or other social activities Perform all self care activities independently  Perform IADL's (shopping, preparing meals, housekeeping, managing finances) independently Call provider office for new concerns or questions  check blood pressure weekly choose a place to take  my blood pressure (home, clinic or office, retail store) write blood pressure results in a log or diary learn about high blood pressure take blood pressure log to all doctor appointments call doctor for signs and symptoms of high blood pressure keep all doctor appointments take medications for blood pressure exactly as prescribed begin an exercise program report new symptoms to your doctor eat more whole grains, fruits and vegetables, lean meats and healthy fats Follow low sodium diet Read food labels for sodium content fall prevention strategies: change position slowly, use assistive device such as walker or cane (per provider recommendations) when walking, keep walkways clear, have good lighting in room. It is important to contact your provider if you have any falls, maintain muscle strength/tone by exercise per provider recommendations.  Follow Up Plan: Telephone follow up appointment with care management team member scheduled for:  04/20/23 at 215 pm          Patient verbalizes understanding of instructions and care plan provided today and agrees to view in MyChart. Active MyChart status and patient understanding of how to access instructions and care plan via MyChart confirmed with patient.  Telephone follow up appointment with care management team member scheduled for:  04/20/23 at 215 pm

## 2023-01-28 DIAGNOSIS — Z79891 Long term (current) use of opiate analgesic: Secondary | ICD-10-CM | POA: Diagnosis not present

## 2023-01-28 DIAGNOSIS — M25511 Pain in right shoulder: Secondary | ICD-10-CM | POA: Diagnosis not present

## 2023-01-28 DIAGNOSIS — M545 Low back pain, unspecified: Secondary | ICD-10-CM | POA: Diagnosis not present

## 2023-01-28 DIAGNOSIS — M47812 Spondylosis without myelopathy or radiculopathy, cervical region: Secondary | ICD-10-CM | POA: Diagnosis not present

## 2023-01-28 DIAGNOSIS — S46009S Unspecified injury of muscle(s) and tendon(s) of the rotator cuff of unspecified shoulder, sequela: Secondary | ICD-10-CM | POA: Diagnosis not present

## 2023-01-29 ENCOUNTER — Other Ambulatory Visit: Payer: Self-pay

## 2023-01-29 MED ORDER — AMLODIPINE BESYLATE 5 MG PO TABS: 5.0000 mg | ORAL_TABLET | Freq: Every morning | ORAL | 3 refills | Status: DC

## 2023-02-01 ENCOUNTER — Other Ambulatory Visit: Payer: Self-pay | Admitting: Family Medicine

## 2023-02-05 ENCOUNTER — Encounter (HOSPITAL_COMMUNITY): Payer: Self-pay

## 2023-02-05 DIAGNOSIS — Z794 Long term (current) use of insulin: Secondary | ICD-10-CM | POA: Diagnosis not present

## 2023-02-05 DIAGNOSIS — E1159 Type 2 diabetes mellitus with other circulatory complications: Secondary | ICD-10-CM

## 2023-02-05 DIAGNOSIS — I10 Essential (primary) hypertension: Secondary | ICD-10-CM

## 2023-02-15 ENCOUNTER — Telehealth: Payer: Self-pay | Admitting: Primary Care

## 2023-02-15 NOTE — Progress Notes (Deleted)
@Patient  ID: Tony Manning, male    DOB: 08/06/1961, 62 y.o.   MRN: [id number]  No chief complaint on file.   Referring provider: Joaquim Nam, MD  HPI: 62 year old male.  Past medical history significant for hypertension, NSTEMI, coronary artery disease, sleep apnea, diabetes, sarcoidosis.   02/16/2023 Patient presents today for surgical clearance.        Allergies  Allergen Reactions   Ambien [Zolpidem] Other (See Comments)    Parasomnias, sleep walking   Lisinopril Cough   Tramadol Diarrhea    Immunization History  Administered Date(s) Administered   Fluad Quad(high Dose 65+) 06/12/2022   Influenza Inj Mdck Quad Pf 06/02/2018   Influenza Whole 09/17/2010   Influenza,inj,Quad PF,6+ Mos 06/08/2013, 05/22/2014, 05/12/2016, 05/08/2019, 05/27/2020   Influenza-Unspecified 06/08/2013, 05/22/2014, 06/08/2015, 05/12/2016, 05/21/2017, 06/02/2018, 05/09/2019, 05/21/2021   PFIZER(Purple Top)SARS-COV-2 Vaccination 11/27/2019, 12/25/2019, 08/07/2020, 05/21/2021   Pneumococcal Polysaccharide-23 09/08/2007, 02/16/2013   Td 09/07/2009   Td (Adult), 2 Lf Tetanus Toxid, Preservative Free 09/07/2009   Zoster Recombinat (Shingrix) 05/29/2022, 08/18/2022    Past Medical History:  Diagnosis Date   Anginal pain (HCC)    Basal cell carcinoma of face    Chest pain, unspecified    Chronic kidney disease    kidney stones   Coronary atherosclerosis of native coronary artery    s/p stent x5   Depression    Diabetes mellitus without mention of complication    Diverticulosis    Dyspnea    Dysrhythmia    Erectile dysfunction    Essential hypertension, benign    GERD (gastroesophageal reflux disease)    Heart murmur    History of kidney stones    History of nephrolithiasis    Hyperlipidemia    Migraine    Myocardial infarction (HCC)    2016   Neuromuscular disorder (HCC)    Osteoarthritis    Pneumonia    Postsurgical percutaneous transluminal coronary angioplasty  status    s/p stent x4   Sarcoidosis    Sleep apnea    uses CPAP   Unspecified sleep apnea    uses C-pap    Tobacco History: Social History   Tobacco Use  Smoking Status Never  Smokeless Tobacco Former   Types: Chew   Quit date: 02/06/2016   Counseling given: Not Answered   Outpatient Medications Prior to Visit  Medication Sig Dispense Refill   amLODipine (NORVASC) 5 MG tablet Take 1 tablet (5 mg total) by mouth every morning. 90 tablet 3   aspirin 81 MG chewable tablet Chew 81 mg by mouth at bedtime.     atorvastatin (LIPITOR) 40 MG tablet TAKE ONE TABLET BY MOUTH EVERY MORNING 90 tablet 2   Blood Glucose Monitoring Suppl (ONETOUCH VERIO) w/Device KIT Use to check blood sugars TID. Dx: E11.9 1 kit 0   carvedilol (COREG) 6.25 MG tablet TAKE ONE TABLET BY MOUTH TWICE DAILY 180 tablet 2   Cholecalciferol (VITAMIN D3) 50 MCG (2000 UT) CAPS Take 2 capsules (4,000 Units total) by mouth daily. 30 capsule    Continuous Glucose Sensor (FREESTYLE LIBRE 3 SENSOR) MISC Apply sensor every 14 days as directed to monitor glucose continuously 2 each 11   cyanocobalamin (VITAMIN B12) 1000 MCG tablet Take 1 tablet (1,000 mcg total) by mouth daily.     cyclobenzaprine (FLEXERIL) 5 MG tablet Take 1 tablet (5 mg total) by mouth 3 (three) times daily as needed for muscle spasms (sedation caution). 60 tablet 2   diclofenac Sodium (VOLTAREN) 1 %  GEL Apply 2-4 g topically 4 (four) times daily. 100 g 5   gabapentin (NEURONTIN) 300 MG capsule 1-2 in the PM, sedation caution.     glimepiride (AMARYL) 4 MG tablet Take 1 tablet (4 mg total) by mouth daily with breakfast.     isosorbide mononitrate (IMDUR) 30 MG 24 hr tablet TAKE ONE TABLET BY MOUTH EVERYDAY AT BEDTIME 90 tablet 2   losartan (COZAAR) 100 MG tablet TAKE ONE TABLET BY MOUTH EVERYDAY AT BEDTIME 90 tablet 1   metFORMIN (GLUCOPHAGE) 500 MG tablet TAKE ONE TABLET BY MOUTH TWICE DAILY 180 tablet 2   nitroGLYCERIN (NITROSTAT) 0.4 MG SL tablet Place 1  tablet (0.4 mg total) under the tongue every 5 (five) minutes as needed for chest pain. 25 tablet 3   ondansetron (ZOFRAN) 4 MG tablet Take 4 mg by mouth every 8 (eight) hours as needed.     oxyCODONE-acetaminophen (PERCOCET/ROXICET) 5-325 MG tablet Take 1 tablet by mouth 4 (four) times daily.     pantoprazole (PROTONIX) 40 MG tablet TAKE ONE TABLET BY MOUTH EVERY MORNING 90 tablet 1   Semaglutide, 1 MG/DOSE, (OZEMPIC, 1 MG/DOSE,) 4 MG/3ML SOPN Inject 1 mg into the skin once a week.     tamsulosin (FLOMAX) 0.4 MG CAPS capsule TAKE ONE CAPSULE BY MOUTH AT BREAKFAST AND AT BEDTIME 60 capsule 2   TRESIBA FLEXTOUCH 100 UNIT/ML FlexTouch Pen Inject 20 Units into THE SKIN daily. THEN increase ONE unit daily UNTIL fasting BLOOD GLUCOSE <150 15 mL 1   No facility-administered medications prior to visit.      Review of Systems  Review of Systems   Physical Exam  There were no vitals taken for this visit. Physical Exam   Lab Results:  CBC    Component Value Date/Time   WBC 5.8 09/11/2022 1602   RBC 4.38 09/11/2022 1602   HGB 13.6 09/11/2022 1602   HCT 38.6 09/11/2022 1602   PLT 214 09/11/2022 1602   MCV 88.1 09/11/2022 1602   MCH 31.1 09/11/2022 1602   MCHC 35.2 09/11/2022 1602   RDW 12.8 09/11/2022 1602   LYMPHSABS 1,740 09/11/2022 1602   MONOABS 0.6 12/23/2021 1147   EOSABS 296 09/11/2022 1602   BASOSABS 17 09/11/2022 1602    BMET    Component Value Date/Time   NA 142 09/11/2022 1602   NA 138 09/21/2017 1156   K 4.5 09/11/2022 1602   CL 107 09/11/2022 1602   CO2 23 09/11/2022 1602   GLUCOSE 114 (H) 09/11/2022 1602   BUN 21 09/11/2022 1602   BUN 17 09/21/2017 1156   CREATININE 1.78 (H) 09/11/2022 1602   CALCIUM 9.7 09/11/2022 1602   GFRNONAA 54 (L) 11/12/2020 1052   GFRAA 42 (L) 05/05/2019 0522    BNP No results found for: "BNP"  ProBNP    Component Value Date/Time   PROBNP 8.0 01/09/2022 1205    Imaging: No results found.   Assessment & Plan:   No  problem-specific Assessment & Plan notes found for this encounter.     Glenford Bayley, NP 02/15/2023

## 2023-02-15 NOTE — Telephone Encounter (Signed)
Fax received from Dr. Milly Jakob with [facility] to perform a Right Shoulder Arthroscopy on patient.  Patient needs surgery clearance. Surgery is 03/18/23. Patient was seen on 02/16/22. Office protocol is a risk assessment can be sent to surgeon if patient has been seen in 60 days or less.   Sending to [facility] for risk assessment or recommendations if patient needs to be seen in office prior to surgical procedure.    Beth patient has OV scheduled 02/16/23

## 2023-02-16 ENCOUNTER — Ambulatory Visit: Payer: Medicare HMO | Admitting: Primary Care

## 2023-02-16 NOTE — Patient Instructions (Addendum)
DUE TO COVID-19 ONLY TWO VISITORS  (aged 64 and older)  ARE ALLOWED TO COME WITH YOU AND STAY IN THE WAITING ROOM ONLY DURING PRE OP AND PROCEDURE.   **NO VISITORS ARE ALLOWED IN THE SHORT STAY AREA OR RECOVERY ROOM!!**  IF YOU WILL BE ADMITTED INTO THE HOSPITAL YOU ARE ALLOWED ONLY FOUR SUPPORT PEOPLE DURING VISITATION HOURS ONLY (7 AM -8PM)   The support person(s) must pass our screening, gel in and out, and wear a mask at all times, including in the patient's room. Patients must also wear a mask when staff or their support person are in the room. Visitors GUEST BADGE MUST BE WORN VISIBLY  One adult visitor may remain with you overnight and MUST be in the room by 8 P.M.     Your procedure is scheduled on: 03/18/23   Report to [facility] Main Entrance    Report to admitting at 7 AM   Call this number if you have problems the morning of surgery [phone]   Do not eat food :After Midnight.   After Midnight you may have the following liquids until 6:30 AM DAY OF SURGERY  Water Black Coffee (sugar ok, NO MILK/CREAM OR CREAMERS)  Tea (sugar ok, NO MILK/CREAM OR CREAMERS) regular and decaf                             Plain Jell-O (NO RED)                                           Fruit ices (not with fruit pulp, NO RED)                                     Popsicles (NO RED)                                                                  Juice: apple, WHITE grape, WHITE cranberry Sports drinks like Gatorade (NO RED)                   The day of surgery:  Drink ONE (1)  G2 at 6:30 AM the morning of surgery. Drink in one sitting. Do not sip.  This drink was given to you during your hospital  pre-op appointment visit. Nothing else to drink after completing the   G2.at 6:30 AM       Oral Hygiene is also important to reduce your risk of infection.                                    Remember - BRUSH YOUR TEETH THE MORNING OF SURGERY WITH YOUR REGULAR  TOOTHPASTE  DENTURES WILL BE REMOVED PRIOR TO SURGERY PLEASE DO NOT APPLY "Poly grip" OR ADHESIVES!!!   Do NOT smoke after Midnight   Take these medicines the morning of surgery with A SIP OF WATER: pain med if needed, Atorvastatin, Carvedilol  Amlodipine, Pantoprazole   Before surgery.Stop taking _ASA 81 mg__6/5/24____    May take usual dose of Glimperide the day BEFORE surgery. But do not take any evening dose. May take Metformin as usual the day BEFORE surgery.  DO NOT TAKE ANY ORAL DIABETIC MEDICATIONS DAY OF YOUR SURGERY(Glimepride, Metformin)  How to Manage Your Diabetes Before and After Surgery  Why is it important to control my blood sugar before and after surgery? Improving blood sugar levels before and after surgery helps healing and can limit problems. A way of improving blood sugar control is eating a healthy diet by:  Eating less sugar and carbohydrates  Increasing activity/exercise  Talking with your doctor about reaching your blood sugar goals High blood sugars (greater than 180 mg/dL) can raise your risk of infections and slow your recovery, so you will need to focus on controlling your diabetes during the weeks before surgery. Make sure that the doctor who takes care of your diabetes knows about your planned surgery including the date and location.  How do I manage my blood sugar before surgery? Check your blood sugar at least 4 times a day, starting 2 days before surgery, to make sure that the level is not too high or low. Check your blood sugar the morning of your surgery when you wake up and every 2 hours until you get to the Short Stay unit. If your blood sugar is less than 70 mg/dL, you will need to treat for low blood sugar: Do not take insulin. Treat a low blood sugar (less than 70 mg/dL) with  cup of clear juice (cranberry or apple), 4  glucose tablets, OR glucose gel. Recheck blood sugar in 15 minutes after treatment (to make sure it is greater than 70 mg/dL). If your blood sugar is not greater than 70 mg/dL on recheck, call [phone] for further instructions. Report your blood sugar to the short stay nurse when you get to Short Stay.  If you are admitted to the hospital after surgery: Your blood sugar will be checked by the staff and you will probably be given insulin after surgery (instead of oral diabetes medicines) to make sure you have good blood sugar levels. The goal for blood sugar control after surgery is 80-180 mg/dL.   WHAT DO I DO ABOUT MY DIABETES MEDICATION?   DO NOT TAKE THE FOLLOWING 7 DAYS PRIOR TO SURGERY: Ozempic, Wegovy, Rybelsus (Semaglutide), Byetta (exenatide), Bydureon (exenatide ER), Victoza, Saxenda (liraglutide), or Trulicity (dulaglutide) Mounjaro (Tirzepatide) Adlyxin (Lixisenatide), Polyethylene Glycol Loxenatide.hold for 7 days . No dose after 03/10/23    Bring CPAP mask and tubing day of surgery.                              You may not have any metal on your body including jewelry, and body piercing             Do not wear  lotions, powders, cologne, or deodorant               Men may shave face and neck.   Do not bring valuables to the hospital.  IS NOT             RESPONSIBLE   FOR VALUABLES.   Contacts, glasses, or bridgework may not be worn into surgery.   Bring small overnight bag day of surgery.   DO NOT BRING YOUR HOME MEDICATIONS TO THE HOSPITAL. PHARMACY WILL DISPENSE MEDICATIONS  LISTED ON YOUR MEDICATION LIST TO YOU DURING YOUR ADMISSION IN THE HOSPITAL!    Patients discharged on the day of surgery will not be allowed to drive home.  Someone NEEDS to stay with you for the first 24 hours after anesthesia.   Special Instructions: Bring a copy of your healthcare power of attorney and living will documents  the day of surgery if you haven't scanned them  before.                Please read over the following fact sheets you were given: IF YOU HAVE QUESTIONS ABOUT YOUR PRE-OP INSTRUCTIONS PLEASE CALL [phone]   [facility]- Preparing for Total Shoulder Arthroplasty    Before surgery, you can play an important role. Because skin is not sterile, your skin needs to be as free of germs as possible. You can reduce the number of germs on your skin by using the following products. Benzoyl Peroxide Gel Reduces the number of germs present on the skin Applied twice a day to shoulder area starting two days before surgery    ==================================================================  Please follow these instructions carefully:  BENZOYL PEROXIDE 5% GEL  Please do not use if you have an allergy to benzoyl peroxide.   If your skin becomes reddened/irritated stop using the benzoyl peroxide.  Starting two days before surgery, apply as follows: Apply benzoyl peroxide in the morning and at night. Apply after taking a shower. If you are not taking a shower clean entire shoulder front, back, and side along with the armpit with a clean wet washcloth.  Place a quarter-sized dollop on your shoulder and rub in thoroughly, making sure to cover the front, back, and side of your shoulder, along with the armpit.   2 days before ____ AM   ____ PM              1 day before ____ AM   ____ PM                         Do this twice a day for two days.  (Last application is the night before surgery, AFTER using the CHG soap as described below).  Do NOT apply benzoyl peroxide gel on the day of surgery.     [facility] - Preparing for Surgery Before surgery, you can play an important role.  Because skin is not sterile, your skin needs to be as free of germs as possible.  You can reduce the number of germs on your skin by washing with CHG (chlorahexidine gluconate) soap before surgery.  CHG is an antiseptic cleaner which kills germs and bonds with the skin to  continue killing germs even after washing. Please DO NOT use if you have an allergy to CHG or antibacterial soaps.  If your skin becomes reddened/irritated stop using the CHG and inform your nurse when you arrive at Short Stay..  You may shave your face/neck.  Please follow these instructions carefully:  1.  Shower with CHG Soap the night before surgery and the  morning of Surgery.  2.  If you choose to wash your hair, wash your hair first as usual with your  normal  shampoo.  3.  After you shampoo, rinse your hair and body thoroughly to remove the  shampoo.                            4.  Use  CHG as you would any other liquid soap.  You can apply chg directly  to the skin and wash                       Gently with a scrungie or clean washcloth.  5.  Apply the CHG Soap to your body ONLY FROM THE NECK DOWN.   Do not use on face/ open                           Wound or open sores. Avoid contact with eyes, ears mouth and genitals (private parts).                       Wash face,  Genitals (private parts) with your normal soap.             6.  Wash thoroughly, paying special attention to the area where your surgery  will be performed.  7.  Thoroughly rinse your body with warm water from the neck down.  8.  DO NOT shower/wash with your normal soap after using and rinsing off  the CHG Soap.             9.  Pat yourself dry with a clean towel.            10.  Wear clean pajamas.            11.  Place clean sheets on your bed the night of your first shower and do not  sleep with pets. Day of Surgery : Do not apply any lotions/deodorants the morning of surgery.  Please wear clean clothes to the hospital/surgery center.  FAILURE TO FOLLOW THESE INSTRUCTIONS MAY RESULT IN THE CANCELLATION OF YOUR SURGERY  PATIENT SIGNATURE_________________________________  __________________________  Tony Manning  An incentive spirometer is a tool that can help keep your lungs clear and active. This tool  measures how well you are filling your lungs with each breath. Taking long deep breaths may help reverse or decrease the chance of developing breathing (pulmonary) problems (especially infection) following: A long period of time when you are unable to move or be active. BEFORE THE PROCEDURE  If the spirometer includes an indicator to show your best effort, your nurse or respiratory therapist will set it to a desired goal. If possible, sit up straight or lean slightly forward. Try not to slouch. Hold the incentive spirometer in an upright position. INSTRUCTIONS FOR USE  Sit on the edge of your bed if possible, or sit up as far as you can in bed or on a chair. Hold the incentive spirometer in an upright position. Breathe out normally. Place the mouthpiece in your mouth and seal your lips tightly around it. Breathe in slowly and as deeply as possible, raising the piston or the ball toward the top of the column. Hold your breath for 3-5 seconds or for as long as possible. Allow the piston or ball to fall to the bottom of the column. Remove the mouthpiece from your mouth and breathe out normally. Rest for a few seconds and repeat Steps 1 through 7 at least 10 times every 1-2 hours when you are awake. Take your time and take a few normal breaths between deep breaths. The spirometer may include an indicator to show your best effort. Use the indicator as a goal to work toward during each repetition. After each set of 10 deep breaths,  practice coughing to be sure your lungs are clear. If you have an incision (the cut made at the time of surgery), support your incision when coughing by placing a pillow or rolled up towels firmly against it. Once you are able to get out of bed, walk around indoors and cough well. You may stop using the incentive spirometer when instructed by your caregiver.  RISKS AND COMPLICATIONS Take your time so you do not get dizzy or light-headed. If you are in pain, you may need to  take or ask for pain medication before doing incentive spirometry. It is harder to take a deep breath if you are having pain. AFTER USE Rest and breathe slowly and easily. It can be helpful to keep track of a log of your progress. Your caregiver can provide you with a simple table to help with this. If you are using the spirometer at home, follow these instructions: SEEK MEDICAL CARE IF:  You are having difficultly using the spirometer. You have trouble using the spirometer as often as instructed. Your pain medication is not giving enough relief while using the spirometer. You develop fever of 100.5 F (38.1 C) or higher. SEEK IMMEDIATE MEDICAL CARE IF:  You cough up bloody sputum that had not been present before. You develop fever of 102 F (38.9 C) or greater. You develop worsening pain at or near the incision site. MAKE SURE YOU:  Understand these instructions. Will watch your condition. Will get help right away if you are not doing well or get worse. Document Released: 01/04/2007 Document Revised: 11/16/2011 Document Reviewed: 03/07/2007 [facility] Patient Information 2014 [place], [place].  [facility] - Preparing for Surgery Before surgery, you can play an important role.  Because skin is not sterile, your skin needs to be as free of germs as possible.  You can reduce the number of germs on your skin by washing with CHG (chlorahexidine gluconate) soap before surgery.  CHG is an antiseptic cleaner which kills germs and bonds with the skin to continue killing germs even after washing. Please DO NOT use if you have an allergy to CHG or antibacterial soaps.  If your skin becomes reddened/irritated stop using the CHG and inform your nurse when you arrive at Short Stay. Do not shave (including legs and underarms) for at least 48 hours prior to the first CHG shower.  You may shave your face/neck.  Please follow these instructions carefully:  1.  Shower with CHG Soap the night before surgery  and the  morning of surgery.  2.  If you choose to wash your hair, wash your hair first as usual with your normal  shampoo.  3.  After you shampoo, rinse your hair and body thoroughly to remove the shampoo.                             4.  Use CHG as you would any other liquid soap.  You can apply chg directly to the skin and wash.  Gently with a scrungie or clean washcloth.  5.  Apply the CHG Soap to your body ONLY FROM THE NECK DOWN.   Do   not use on face/ open                           Wound or open sores. Avoid contact with eyes, ears mouth and   genitals (private parts).  Wash face,  Genitals (private parts) with your normal soap.             6.  Wash thoroughly, paying special attention to the area where your    surgery  will be performed.  7.  Thoroughly rinse your body with warm water from the neck down.  8.  DO NOT shower/wash with your normal soap after using and rinsing off the CHG Soap.                9.  Pat yourself dry with a clean towel.            10.  Wear clean pajamas.            11.  Place clean sheets on your bed the night of your first shower and do not  sleep with pets. Day of Surgery : Do not apply any lotions/deodorants the morning of surgery.  Please wear clean clothes to the hospital/surgery center.  FAILURE TO FOLLOW THESE INSTRUCTIONS MAY RESULT IN THE CANCELLATION OF YOUR SURGERY  PATIENT SIGNATURE_________________________________  NURSE SIGNATURE__________________________________  ________________________________________________________________________ ______________________________________________________________________________________________________________________

## 2023-02-17 ENCOUNTER — Telehealth: Payer: Self-pay

## 2023-02-17 NOTE — Progress Notes (Signed)
Care Management & Coordination Services Pharmacy Team  Reason for Encounter: Medication Coordination and Delivery  Contacted patient to discuss medications and coordinate delivery from [facility].  Spoke with patient on 02/17/2023   Cycle dispensing form sent to Chrys Racer, PharmD for review.   Last adherence delivery date: 02/01/2023      Patient is due for next adherence delivery on: 03/02/2023  This delivery to include: Adherence Packaging  30 Days  Packs: Amlodipine 5 mg- 1 tablet daily (1 breakfast) Aspirin 81mg - 1 tablet daily (breakfast) Metformin 500mg  1 tablet twice daily (1 tablet  breakfast, 1 tablet evening meal) Carvedilol 6.25mg - 1 tablet twice daily (1 breakfast, 1 evening meal) Isosorbide 30mg - 1 tablet daily (1 bedtime) Tamsulosin 0.4mg  2 capsule daily (1 breakfast, 1 bedtime)   Losartan 100 mg- 1 tablet daily- (1 bedtime) Atorvastatin 40mg - 1 tablet daily (1 breakfast) Glimepiride 4mg - 1 tablet daily (1 breakfast) Pantoprazole 40 mg- 1 tablet daily (1 breakfast)   Patient requested to be delivered in vials: Gabapentin 300 mg - 1-2 tablets in the PM Cyclobenzaprine 5 mg - 1 tablet three times daily PRN  Receives from Manufacturer: Ozempic   Patient declined the following medications this month: None  Refills requested from providers include: Not complete Tamsulosin 0.4mg  2 capsule daily (1 breakfast, 1 bedtime)   Losartan 100 mg- 1 tablet daily- (1 bedtime)  Medications: Outpatient Encounter Medications as of 02/17/2023  Medication Sig Note   amLODipine (NORVASC) 5 MG tablet Take 1 tablet (5 mg total) by mouth every morning.    aspirin 81 MG chewable tablet Chew 81 mg by mouth at bedtime.    atorvastatin (LIPITOR) 40 MG tablet TAKE ONE TABLET BY MOUTH EVERY MORNING    Blood Glucose Monitoring Suppl (ONETOUCH VERIO) w/Device KIT Use to check blood sugars TID. Dx: E11.9    carvedilol (COREG) 6.25 MG tablet TAKE ONE TABLET BY MOUTH TWICE  DAILY    Cholecalciferol (VITAMIN D3) 50 MCG (2000 UT) CAPS Take 2 capsules (4,000 Units total) by mouth daily.    Continuous Glucose Sensor (FREESTYLE LIBRE 3 SENSOR) MISC Apply sensor every 14 days as directed to monitor glucose continuously    cyanocobalamin (VITAMIN B12) 1000 MCG tablet Take 1 tablet (1,000 mcg total) by mouth daily.    cyclobenzaprine (FLEXERIL) 5 MG tablet Take 1 tablet (5 mg total) by mouth 3 (three) times daily as needed for muscle spasms (sedation caution). 11/05/2022: Taking 1 in the evening   diclofenac Sodium (VOLTAREN) 1 % GEL Apply 2-4 g topically 4 (four) times daily.    gabapentin (NEURONTIN) 300 MG capsule 1-2 in the PM, sedation caution.    glimepiride (AMARYL) 4 MG tablet Take 1 tablet (4 mg total) by mouth daily with breakfast.    isosorbide mononitrate (IMDUR) 30 MG 24 hr tablet TAKE ONE TABLET BY MOUTH EVERYDAY AT BEDTIME    losartan (COZAAR) 100 MG tablet TAKE ONE TABLET BY MOUTH EVERYDAY AT BEDTIME    metFORMIN (GLUCOPHAGE) 500 MG tablet TAKE ONE TABLET BY MOUTH TWICE DAILY    nitroGLYCERIN (NITROSTAT) 0.4 MG SL tablet Place 1 tablet (0.4 mg total) under the tongue every 5 (five) minutes as needed for chest pain.    ondansetron (ZOFRAN) 4 MG tablet Take 4 mg by mouth every 8 (eight) hours as needed.    oxyCODONE-acetaminophen (PERCOCET/ROXICET) 5-325 MG tablet Take 1 tablet by mouth 4 (four) times daily. 11/05/2022: Takes half tablet daily   pantoprazole (PROTONIX) 40 MG tablet TAKE ONE TABLET BY  MOUTH EVERY MORNING    Semaglutide, 1 MG/DOSE, (OZEMPIC, 1 MG/DOSE,) 4 MG/3ML SOPN Inject 1 mg into the skin once a week. 12/03/2022: PAP medication   tamsulosin (FLOMAX) 0.4 MG CAPS capsule TAKE ONE CAPSULE BY MOUTH AT BREAKFAST AND AT BEDTIME    TRESIBA FLEXTOUCH 100 UNIT/ML FlexTouch Pen Inject 20 Units into THE SKIN daily. THEN increase ONE unit daily UNTIL fasting BLOOD GLUCOSE <150    No facility-administered encounter medications on file as of 02/17/2023.   BP  Readings from Last 3 Encounters:  10/20/22 (!) 102/52  09/11/22 104/60  08/13/22 118/62    Pulse Readings from Last 3 Encounters:  10/20/22 70  09/11/22 67  08/13/22 68    Lab Results  Component Value Date/Time   HGBA1C 8.3 (H) 09/11/2022 04:02 PM   HGBA1C 8.0 (A) 06/12/2022 07:57 AM   HGBA1C 7.2 (H) 12/23/2021 11:47 AM   Lab Results  Component Value Date   CREATININE 1.78 (H) 09/11/2022   BUN 21 09/11/2022   GFR 47.53 (L) 01/09/2022   GFRNONAA 54 (L) 11/12/2020   GFRAA 42 (L) 05/05/2019   NA 142 09/11/2022   K 4.5 09/11/2022   CALCIUM 9.7 09/11/2022   CO2 23 09/11/2022   Al Corpus, CPP notified   Claudina Lick, [place] Clinical Pharmacy Assistant [phone]

## 2023-02-22 ENCOUNTER — Other Ambulatory Visit: Payer: Self-pay | Admitting: Cardiology

## 2023-02-22 ENCOUNTER — Other Ambulatory Visit: Payer: Self-pay | Admitting: Family Medicine

## 2023-02-23 ENCOUNTER — Encounter: Payer: Self-pay | Admitting: Internal Medicine

## 2023-02-23 ENCOUNTER — Ambulatory Visit (INDEPENDENT_AMBULATORY_CARE_PROVIDER_SITE_OTHER): Payer: Medicare HMO | Admitting: Internal Medicine

## 2023-02-23 VITALS — BP 120/62 | HR 73 | Temp 96.8°F | Ht 70.0 in | Wt 205.0 lb

## 2023-02-23 DIAGNOSIS — I951 Orthostatic hypotension: Secondary | ICD-10-CM | POA: Diagnosis not present

## 2023-02-23 DIAGNOSIS — I2510 Atherosclerotic heart disease of native coronary artery without angina pectoris: Secondary | ICD-10-CM | POA: Diagnosis not present

## 2023-02-23 NOTE — Assessment & Plan Note (Addendum)
Has had striking symptoms in the past 3 weeks Documented BP drop--almost No true vestibular symptoms Will try off the isosorbide--most likely culprit Last echo shows normal EF  Discussed that tamsulosin can add to this --and other heart meds Takes gabapentin and cyclobenzaprine in the evenings--unlikely the cause of the problem

## 2023-02-23 NOTE — Patient Instructions (Signed)
Please stop the isosorbide to see if it helps the dizziness. You can restart if you get chest pain or trouble breathing with exertion.

## 2023-02-23 NOTE — Progress Notes (Signed)
Subjective:    Patient ID: Tony Manning, male    DOB: Jan 25, 1961, 62 y.o.   MRN: [id number]  HPI Here due to dizziness  Has worsening dizziness over the past 3 weeks Doesn't remember any inciting event Did fall and hit head a few weeks ago---while stepping on ladder (hit head on deck). No LOC from that  Also injured head in December---fell at work and had whiplash Symptoms resolved in a few weeks  Worst dizziness is if he is bending over and stands up Does have spinning sensation If he stands--he gets dizzy---especially if he gets up quick No dizziness in bed or turning (like he had during ear problem in the past)  No chest pain No SOB No edema  Current Outpatient Medications on File Prior to Visit  Medication Sig Dispense Refill   amLODipine (NORVASC) 5 MG tablet Take 1 tablet (5 mg total) by mouth every morning. 90 tablet 3   aspirin 81 MG chewable tablet Chew 81 mg by mouth at bedtime.     atorvastatin (LIPITOR) 40 MG tablet TAKE ONE TABLET BY MOUTH EVERY MORNING 90 tablet 2   Blood Glucose Monitoring Suppl (ONETOUCH VERIO) w/Device KIT Use to check blood sugars TID. Dx: E11.9 1 kit 0   carvedilol (COREG) 6.25 MG tablet TAKE ONE TABLET BY MOUTH TWICE DAILY 180 tablet 2   Cholecalciferol (VITAMIN D3) 50 MCG (2000 UT) CAPS Take 2 capsules (4,000 Units total) by mouth daily. 30 capsule    Continuous Glucose Sensor (FREESTYLE LIBRE 3 SENSOR) MISC Apply sensor every 14 days as directed to monitor glucose continuously 2 each 11   cyanocobalamin (VITAMIN B12) 1000 MCG tablet Take 1 tablet (1,000 mcg total) by mouth daily.     cyclobenzaprine (FLEXERIL) 5 MG tablet Take 1 tablet (5 mg total) by mouth 3 (three) times daily as needed for muscle spasms (sedation caution). 60 tablet 2   diclofenac Sodium (VOLTAREN) 1 % GEL Apply 2-4 g topically 4 (four) times daily. 100 g 5   gabapentin (NEURONTIN) 300 MG capsule 1-2 in the PM, sedation caution.     glimepiride (AMARYL) 4 MG  tablet Take 1 tablet (4 mg total) by mouth daily with breakfast.     isosorbide mononitrate (IMDUR) 30 MG 24 hr tablet TAKE ONE TABLET BY MOUTH EVERYDAY AT BEDTIME 90 tablet 2   losartan (COZAAR) 100 MG tablet TAKE ONE TABLET BY MOUTH EVERYDAY AT BEDTIME 90 tablet 1   metFORMIN (GLUCOPHAGE) 500 MG tablet TAKE ONE TABLET BY MOUTH TWICE DAILY 180 tablet 2   nitroGLYCERIN (NITROSTAT) 0.4 MG SL tablet Place 1 tablet (0.4 mg total) under the tongue every 5 (five) minutes as needed for chest pain. 25 tablet 3   ondansetron (ZOFRAN) 4 MG tablet Take 4 mg by mouth every 8 (eight) hours as needed.     oxyCODONE-acetaminophen (PERCOCET/ROXICET) 5-325 MG tablet Take 1 tablet by mouth 4 (four) times daily.     pantoprazole (PROTONIX) 40 MG tablet TAKE ONE TABLET BY MOUTH EVERY MORNING 90 tablet 1   Semaglutide, 1 MG/DOSE, (OZEMPIC, 1 MG/DOSE,) 4 MG/3ML SOPN Inject 1 mg into the skin once a week.     tamsulosin (FLOMAX) 0.4 MG CAPS capsule TAKE ONE CAPSULE BY MOUTH AT BREAKFAST AND AT BEDTIME 60 capsule 2   TRESIBA FLEXTOUCH 100 UNIT/ML FlexTouch Pen Inject 20 Units into THE SKIN daily. THEN increase ONE unit daily UNTIL fasting BLOOD GLUCOSE <150 15 mL 1   No current facility-administered medications on  file prior to visit.    Allergies  Allergen Reactions   Ambien [Zolpidem] Other (See Comments)    Parasomnias, sleep walking   Lisinopril Cough   Tramadol Diarrhea    Past Medical History:  Diagnosis Date   Anginal pain (HCC)    Basal cell carcinoma of face    Chest pain, unspecified    Chronic kidney disease    kidney stones   Coronary atherosclerosis of native coronary artery    s/p stent x5   Depression    Diabetes mellitus without mention of complication    Diverticulosis    Dyspnea    Dysrhythmia    Erectile dysfunction    Essential hypertension, benign    GERD (gastroesophageal reflux disease)    Heart murmur    History of kidney stones    History of nephrolithiasis     Hyperlipidemia    Migraine    Myocardial infarction (HCC)    2016   Neuromuscular disorder (HCC)    Osteoarthritis    Pneumonia    Postsurgical percutaneous transluminal coronary angioplasty status    s/p stent x4   Sarcoidosis    Sleep apnea    uses CPAP   Unspecified sleep apnea    uses C-pap    Past Surgical History:  Procedure Laterality Date   ANTERIOR CERVICAL DECOMP/DISCECTOMY FUSION N/A 04/07/2017   Procedure: ANTERIOR CERVICAL DECOMPRESSION FUSION, CERVICAL 4-5 WITH INSTRUMENTATION AND ALLOGRAFT;  Surgeon: Estill Bamberg, MD;  Location: [facility] OR;  Service: Orthopedics;  Laterality: N/A;  ANTERIOR CERVICAL DECOMPRESSION FUSION, CERVICAL 4-5 WITH INSTRUMENTATION AND ALLOGRAFT; REQUEST 2.5 HOURS AND FLIP ROOM   APPENDECTOMY  2005   CARPAL TUNNEL RELEASE     CERVICAL FUSION  2009   COLONOSCOPY     CORONARY ANGIOPLASTY WITH STENT PLACEMENT     2004   CORONARY STENT INTERVENTION N/A 10/16/2016   Procedure: Coronary Stent Intervention;  Surgeon: Yates Decamp, MD;  Location[facility] INVASIVE CV LAB;  Service: Cardiovascular;  Laterality: N/A;   CYSTOSCOPY WITH RETROGRADE PYELOGRAM, URETEROSCOPY AND STENT PLACEMENT Right 05/12/2019   Procedure: CYSTOSCOPY WITH RIGHT RETROGRADE PYELOGRAM, URETEROSCOPY HOLMIUM LASER AND STENT PLACEMENT;  Surgeon: Crista Elliot, MD;  Location: [facility] ORS;  Service: Urology;  Laterality: Right;   ELBOW SURGERY     bilateral   HAND SURGERY Left    KNEE SURGERY Bilateral    Bilateral    LEFT HEART CATH AND CORONARY ANGIOGRAPHY N/A 10/16/2016   Procedure: Left Heart Cath and Coronary Angiography;  Surgeon: Yates Decamp, MD;  Location: [facility] INVASIVE CV LAB;  Service: Cardiovascular;  Laterality: N/A;   LEFT HEART CATHETERIZATION WITH CORONARY ANGIOGRAM N/A 06/14/2014   Procedure: LEFT HEART CATHETERIZATION WITH CORONARY ANGIOGRAM;  Surgeon: Pamella Pert, MD;  Location: [facility] CATH LAB;  Service: Cardiovascular;  Laterality: N/A;   LUNG BIOPSY     sarcoid   ROTATOR  CUFF REPAIR     left   SHOULDER ARTHROSCOPY WITH SUBACROMIAL DECOMPRESSION Left 06/16/2018   Procedure: LEFT SHOULDER ARTHROSCOPY WITH ROTATOR CUFF DEBRIDEMENT AND  SUBACROMIAL DECOMPRESSION;  Surgeon: Jones Broom, MD;  Location: [facility] OR;  Service: Orthopedics;  Laterality: Left;    Family History  Problem Relation Age of Onset   Arthritis Mother    Depression Mother    Stroke Mother    Arthritis Father    Diabetes Father    Coronary artery disease Father    Colon cancer Neg Hx    Prostate cancer Neg Hx  Esophageal cancer Neg Hx    Rectal cancer Neg Hx    Stomach cancer Neg Hx     Social History   Socioeconomic History   Marital status: Married    Spouse name: Not on file   Number of children: 2   Years of education: Not on file   Highest education level: Not on file  Occupational History   Occupation: disabled    Employer: unemployed  Tobacco Use   Smoking status: Never   Smokeless tobacco: Former    Types: Chew    Quit date: 02/06/2016  Vaping Use   Vaping Use: Never used  Substance and Sexual Activity   Alcohol use: No    Alcohol/week: 0.0 standard drinks of alcohol   Drug use: No   Sexual activity: Yes    Birth control/protection: Condom  Other Topics Concern   Not on file  Social History Narrative   Married, [phone] kids   On disability   Social Determinants of Health   Financial Resource Strain: Low Risk  (11/04/2022)   Overall Financial Resource Strain (CARDIA)    Difficulty of Paying Living Expenses: Not hard at all  Food Insecurity: No Food Insecurity (11/04/2022)   Hunger Vital Sign    Worried About Running Out of Food in the Last Year: Never true    Ran Out of Food in the Last Year: Never true  Transportation Needs: No Transportation Needs (11/04/2022)   PRAPARE - Transportation    Lack of Transportation (Medical): No    Lack of Transportation (Non-Medical): No  Physical Activity: Insufficiently Active (11/04/2022)   Exercise Vital Sign     Days of Exercise per Week: 5 days    Minutes of Exercise per Session: 20 min  Stress: No Stress Concern Present (11/04/2022)   [employer] of Occupational Health - Occupational Stress Questionnaire    Feeling of Stress : Not at all  Social Connections: Moderately Isolated (11/04/2022)   Social Connection and Isolation Panel [NHANES]    Frequency of Communication with Friends and Family: Three times a week    Frequency of Social Gatherings with Friends and Family: More than three times a week    Attends Religious Services: Never    Database administrator or Organizations: No    Attends Banker Meetings: Never    Marital Status: Married  Catering manager Violence: Not At Risk (11/04/2022)   Humiliation, Afraid, Rape, and Kick questionnaire    Fear of Current or Ex-Partner: No    Emotionally Abused: No    Physically Abused: No    Sexually Abused: No   Review of Systems No tinnitus. Stable hearing loss Sleeps okay--some nocturia Appetite is not great---likely related to ozempic. Has lost some weight    Objective:   Physical Exam Constitutional:      Appearance: Normal appearance.  Cardiovascular:     Rate and Rhythm: Normal rate and regular rhythm.     Heart sounds: No murmur heard.    No gallop.  Pulmonary:     Effort: Pulmonary effort is normal.     Breath sounds: Normal breath sounds. No wheezing or rales.  Musculoskeletal:     Cervical back: Neck supple.     Right lower leg: No edema.     Left lower leg: No edema.  Lymphadenopathy:     Cervical: No cervical adenopathy.  Neurological:     Mental Status: He is alert.  Psychiatric:        Mood and  Affect: Mood normal.            Assessment & Plan:

## 2023-02-23 NOTE — Assessment & Plan Note (Signed)
He has not had clear angina of late Will try off the isosorbide due to his dizziness Discussed that he should restart if chest pain or exertional SOB that is noticeable Still on ASA, carvedilol 6.25 bid, losartan 100mg 

## 2023-02-25 DIAGNOSIS — M47812 Spondylosis without myelopathy or radiculopathy, cervical region: Secondary | ICD-10-CM | POA: Diagnosis not present

## 2023-02-25 DIAGNOSIS — Z79891 Long term (current) use of opiate analgesic: Secondary | ICD-10-CM | POA: Diagnosis not present

## 2023-02-25 DIAGNOSIS — M545 Low back pain, unspecified: Secondary | ICD-10-CM | POA: Diagnosis not present

## 2023-02-25 DIAGNOSIS — S46009S Unspecified injury of muscle(s) and tendon(s) of the rotator cuff of unspecified shoulder, sequela: Secondary | ICD-10-CM | POA: Diagnosis not present

## 2023-02-25 DIAGNOSIS — M25511 Pain in right shoulder: Secondary | ICD-10-CM | POA: Diagnosis not present

## 2023-02-26 NOTE — Progress Notes (Signed)
COVID Vaccine received:  []  No [x]  Yes Date of any COVID positive Test in last 90 days: No PCP - Dr. Crawford Givens Cardiologist - Sherilyn Banker MD  Chest x-ray - 02/16/22  EPIC EKG -  05/06/22  EPIC Stress Test - 06/05/16  EPIC ECHO - 5/3/23EPIC Cardiac Cath -10/16/16  EPIC   Bowel Prep - [x]  No  []   Yes ______  Pacemaker / ICD device [x]  No []  Yes   Spinal Cord Stimulator:[x]  No []  Yes       History of Sleep Apnea? []  No [x]  Yes   CPAP used?- []  No [x]  Yes    Does the patient monitor blood sugar?          []  No [x]  Yes  []  N/A  Patient has: []  NO Hx DM   []  Pre-DM                 []  DM1  [x]   DM2 Does patient have a [employer] or Dexacom? []  No [x]  Yes   Fasting Blood Sugar Ranges- 140 Checks Blood Sugar ___1__ times a day  GLP1 agonist / usual dose - Ozempic GLP1 instructions:  SGLT-2 inhibitors / usual dose -  SGLT-2 instructions:   Blood Thinner / Instructions: Aspirin Instructions:81 mg   Comments:   Activity level: Patient is able to climb a flight of stairs without difficulty; []  No CP  []  No SOB,    Patient can  perform ADLs without assistance.   Anesthesia review: Heart murmur, DM, CAD, CKD,Heart cath w/ stents 2018, Hypotension recently  Patient denies shortness of breath, fever, cough and chest pain at PAT appointment.  Patient verbalized understanding and agreement to the Pre-Surgical Instructions that were given to them at this PAT appointment. Patient was also educated of the need to review these PAT instructions again prior to his/her surgery.I reviewed the appropriate phone numbers to call if they have any and questions or concerns.

## 2023-03-01 ENCOUNTER — Encounter (HOSPITAL_COMMUNITY): Payer: Self-pay

## 2023-03-01 ENCOUNTER — Encounter (HOSPITAL_COMMUNITY)
Admission: RE | Admit: 2023-03-01 | Discharge: 2023-03-01 | Disposition: A | Discharge status: Home / Self Care | Payer: Medicare HMO | Source: Ambulatory Visit | Attending: Orthopedic Surgery | Admitting: Orthopedic Surgery

## 2023-03-01 ENCOUNTER — Ambulatory Visit (INDEPENDENT_AMBULATORY_CARE_PROVIDER_SITE_OTHER): Payer: Medicare HMO | Admitting: Family Medicine

## 2023-03-01 ENCOUNTER — Encounter: Payer: Self-pay | Admitting: Family Medicine

## 2023-03-01 ENCOUNTER — Other Ambulatory Visit: Payer: Self-pay

## 2023-03-01 VITALS — BP 100/52 | HR 71 | Temp 98.2°F | Ht 70.0 in | Wt 200.0 lb

## 2023-03-01 DIAGNOSIS — Z794 Long term (current) use of insulin: Secondary | ICD-10-CM | POA: Insufficient documentation

## 2023-03-01 DIAGNOSIS — I10 Essential (primary) hypertension: Secondary | ICD-10-CM

## 2023-03-01 DIAGNOSIS — Z01812 Encounter for preprocedural laboratory examination: Secondary | ICD-10-CM | POA: Insufficient documentation

## 2023-03-01 DIAGNOSIS — E1149 Type 2 diabetes mellitus with other diabetic neurological complication: Secondary | ICD-10-CM | POA: Insufficient documentation

## 2023-03-01 DIAGNOSIS — E109 Type 1 diabetes mellitus without complications: Secondary | ICD-10-CM

## 2023-03-01 HISTORY — DX: Family history of other specified conditions: Z84.89

## 2023-03-01 LAB — BASIC METABOLIC PANEL
Anion gap: 10 (ref 5–15)
BUN: 25 mg/dL — ABNORMAL HIGH (ref 8–23)
CO2: 20 mmol/L — ABNORMAL LOW (ref 22–32)
Calcium: 9.6 mg/dL (ref 8.9–10.3)
Chloride: 108 mmol/L (ref 98–111)
Creatinine, Ser: 1.49 mg/dL — ABNORMAL HIGH (ref 0.61–1.24)
GFR, Estimated: 53 mL/min — ABNORMAL LOW (ref >60)
Glucose, Bld: 214 mg/dL — ABNORMAL HIGH (ref 70–99)
Potassium: 4.5 mmol/L (ref 3.5–5.1)
Sodium: 138 mmol/L (ref 135–145)

## 2023-03-01 LAB — HEMOGLOBIN A1C
Hgb A1c MFr Bld: 7.2 % — ABNORMAL HIGH (ref 4.8–5.6)
Mean Plasma Glucose: 159.94 mg/dL

## 2023-03-01 LAB — GLUCOSE, CAPILLARY: Glucose-Capillary: 173 mg/dL — ABNORMAL HIGH (ref 70–99)

## 2023-03-01 MED ORDER — CYCLOBENZAPRINE HCL 5 MG PO TABS: 5.0000 mg | ORAL_TABLET | Freq: Every day | ORAL | Status: DC

## 2023-03-01 MED ORDER — AMLODIPINE BESYLATE 5 MG PO TABS: ORAL_TABLET | ORAL | Status: DC

## 2023-03-01 NOTE — Patient Instructions (Addendum)
Stop amlodipine for 1 week.  Then if lightheaded cut losartan in half if needed for 1 week.  Let me know if that isn't helping.   Take care.  Glad to see you.

## 2023-03-01 NOTE — Progress Notes (Unsigned)
Lower BP noted. Stopped isosorbide in the meantime.  No CP.  Lightheaded on standing.  Fatigued.  Dec in appetite.    Labs from this AM d/w pt.   Recent Results (from the past 2160 hour(s))  Glucose, capillary     Status: Abnormal   Collection Time: 03/01/23  8:06 AM  Result Value Ref Range   Glucose-Capillary 173 (H) 70 - 99 mg/dL    Comment: Glucose reference range applies only to samples taken after fasting for at least 8 hours.  Hemoglobin A1c per protocol     Status: Abnormal   Collection Time: 03/01/23  8:32 AM  Result Value Ref Range   Hgb A1c MFr Bld 7.2 (H) 4.8 - 5.6 %    Comment: (NOTE) Pre diabetes:          5.7%-6.4%  Diabetes:              >6.4%  Glycemic control for   <7.0% adults with diabetes    Mean Plasma Glucose 159.94 mg/dL    Comment: Performed at [facility], [address] [place], [place] [address]  Basic metabolic panel per protocol     Status: Abnormal   Collection Time: 03/01/23  8:32 AM  Result Value Ref Range   Sodium 138 135 - 145 mmol/L   Potassium 4.5 3.5 - 5.1 mmol/L   Chloride 108 98 - 111 mmol/L   CO2 20 (L) 22 - 32 mmol/L   Glucose, Bld 214 (H) 70 - 99 mg/dL    Comment: Glucose reference range applies only to samples taken after fasting for at least 8 hours.   BUN 25 (H) 8 - 23 mg/dL   Creatinine, Ser 6.04 (H) 0.61 - 1.24 mg/dL   Calcium 9.6 8.9 - 54.0 mg/dL   GFR, Estimated 53 (L) >60 mL/min    Comment: (NOTE) Calculated using the CKD-EPI Creatinine Equation (2021)    Anion gap 10 5 - 15    Comment: Performed at [facility], [address] [place], [place] [address]    Meds, vitals, and allergies reviewed.   ROS: Per HPI unless specifically indicated in ROS section   Nad Ncat Neck supple, no LA RRR No murmur heard on exam.   Pulse 70 sitting.  Up to 79 standing.   Ctab Skin well-perfused.

## 2023-03-02 ENCOUNTER — Encounter: Payer: Self-pay | Admitting: Cardiology

## 2023-03-03 NOTE — Assessment & Plan Note (Signed)
Suspect that his hypertension is still overtreated. Stop amlodipine for 1 week.  Then if lightheaded cut losartan in half if needed for 1 week.  Let me know if that isn't helping.  He agrees to plan.  Rationale discussed with patient.

## 2023-03-08 ENCOUNTER — Ambulatory Visit: Payer: Medicare HMO | Admitting: Primary Care

## 2023-03-08 ENCOUNTER — Encounter: Payer: Self-pay | Admitting: Primary Care

## 2023-03-08 VITALS — BP 110/62 | HR 64 | Temp 98.3°F | Ht 70.0 in | Wt 202.0 lb

## 2023-03-08 DIAGNOSIS — D869 Sarcoidosis, unspecified: Secondary | ICD-10-CM

## 2023-03-08 DIAGNOSIS — Z01811 Encounter for preprocedural respiratory examination: Secondary | ICD-10-CM | POA: Diagnosis not present

## 2023-03-08 DIAGNOSIS — Z0000 Encounter for general adult medical examination without abnormal findings: Secondary | ICD-10-CM | POA: Insufficient documentation

## 2023-03-08 NOTE — Progress Notes (Signed)
@Patient  ID: Tony Manning, male    DOB: 1960/12/22, 62 y.o.   MRN: [id number]  Chief Complaint  Patient presents with   Follow-up    Wearing cpap nightly- pressure and mask is okay.     Referring provider: Joaquim Nam, MD  HPI: 62 year old male, former smoker. PMH significant for HTN, NSTEMI, sleep apnea, sarcoidosis, diabetes, vit D deficiency.   03/08/2023 Patient presents today for pre-op risk assessment. Planning upcoming shoulder arthroscopy with rotator cuff repair on 03/18/23. He follows with our office for sleep apnea and sarcoidosis. He is 100% compliant with CPAP use > 4 hours over the last 30 days Current CPAP pressure 8cm H20. Sleeping well. No issues with CPAP pressure or mask fit. No acute respiratory issues. Denies chest pain, shortness of breath or cough.   Airview download 02/02/23-03/03/23 30/30 days used; 100% > 4 hours Average usage 7 hours 50 min Pressure 8cm h20 Airleaks 15.8cm h20 AHI 0.5   Allergies  Allergen Reactions   Ambien [Zolpidem] Other (See Comments)    Parasomnias, sleep walking   Lisinopril Cough   Tramadol Diarrhea    Immunization History  Administered Date(s) Administered   Fluad Quad(high Dose 65+) 06/12/2022   Influenza Inj Mdck Quad Pf 06/02/2018   Influenza Whole 09/17/2010   Influenza,inj,Quad PF,6+ Mos 06/08/2013, 05/22/2014, 05/12/2016, 05/08/2019, 05/27/2020   Influenza-Unspecified 06/08/2013, 05/22/2014, 06/08/2015, 05/12/2016, 05/21/2017, 06/02/2018, 05/09/2019, 05/21/2021   PFIZER(Purple Top)SARS-COV-2 Vaccination 11/27/2019, 12/25/2019, 08/07/2020, 05/21/2021   Pneumococcal Polysaccharide-23 09/08/2007, 02/16/2013   Td 09/07/2009   Td (Adult), 2 Lf Tetanus Toxid, Preservative Free 09/07/2009   Zoster Recombinant(Shingrix) 05/29/2022, 08/18/2022    Past Medical History:  Diagnosis Date   Anginal pain (HCC)    Basal cell carcinoma of face    Chest pain, unspecified    Chronic kidney disease    kidney stones    Coronary atherosclerosis of native coronary artery    s/p stent x5   Depression    Diabetes mellitus without mention of complication    Diverticulosis    Dyspnea    Dysrhythmia    Erectile dysfunction    Essential hypertension, benign    Family history of adverse reaction to anesthesia    GERD (gastroesophageal reflux disease)    Heart murmur    History of kidney stones    History of nephrolithiasis    Hyperlipidemia    Migraine    Myocardial infarction (HCC)    2016   Neuromuscular disorder (HCC)    Osteoarthritis    Pneumonia    Postsurgical percutaneous transluminal coronary angioplasty status    s/p stent x4   Sarcoidosis    Sleep apnea    uses CPAP   Unspecified sleep apnea    uses C-pap    Tobacco History: Social History   Tobacco Use  Smoking Status Never  Smokeless Tobacco Former   Types: Chew   Quit date: 02/06/2016   Counseling given: Not Answered   Outpatient Medications Prior to Visit  Medication Sig Dispense Refill   aspirin 81 MG chewable tablet Chew 81 mg by mouth at bedtime.     atorvastatin (LIPITOR) 40 MG tablet TAKE ONE TABLET BY MOUTH EVERY MORNING 90 tablet 2   Blood Glucose Monitoring Suppl (ONETOUCH VERIO) w/Device KIT Use to check blood sugars TID. Dx: E11.9 1 kit 0   carvedilol (COREG) 6.25 MG tablet TAKE ONE TABLET BY MOUTH TWICE DAILY 180 tablet 2   Cholecalciferol (VITAMIN D3) 50 MCG (2000 UT) CAPS  Take 2 capsules (4,000 Units total) by mouth daily. 30 capsule    Continuous Glucose Sensor (FREESTYLE LIBRE 3 SENSOR) MISC Apply sensor every 14 days as directed to monitor glucose continuously 2 each 11   cyanocobalamin (VITAMIN B12) 1000 MCG tablet Take 1 tablet (1,000 mcg total) by mouth daily.     cyclobenzaprine (FLEXERIL) 5 MG tablet Take 1 tablet (5 mg total) by mouth at bedtime.     diclofenac Sodium (VOLTAREN) 1 % GEL Apply 2-4 g topically 4 (four) times daily. 100 g 5   gabapentin (NEURONTIN) 300 MG capsule 1-2 in the PM, sedation  caution.     glimepiride (AMARYL) 4 MG tablet Take 1 tablet (4 mg total) by mouth daily with breakfast.     losartan (COZAAR) 100 MG tablet TAKE ONE TABLET BY MOUTH EVERYDAY AT BEDTIME 90 tablet 1   metFORMIN (GLUCOPHAGE) 500 MG tablet TAKE ONE TABLET BY MOUTH TWICE DAILY 180 tablet 2   nitroGLYCERIN (NITROSTAT) 0.4 MG SL tablet Place 1 tablet (0.4 mg total) under the tongue every 5 (five) minutes as needed for chest pain. 25 tablet 3   ondansetron (ZOFRAN) 4 MG tablet Take 4 mg by mouth every 8 (eight) hours as needed for nausea or vomiting.     oxyCODONE-acetaminophen (PERCOCET/ROXICET) 5-325 MG tablet Take 0.5-1 tablets by mouth every 6 (six) hours as needed for severe pain.     pantoprazole (PROTONIX) 40 MG tablet TAKE ONE TABLET BY MOUTH EVERY MORNING 90 tablet 1   Semaglutide, 1 MG/DOSE, (OZEMPIC, 1 MG/DOSE,) 4 MG/3ML SOPN Inject 1 mg into the skin every Sunday.     tamsulosin (FLOMAX) 0.4 MG CAPS capsule TAKE ONE CAPSULE BY MOUTH AT BREAKFAST AND AT BEDTIME 60 capsule 2   TRESIBA FLEXTOUCH 100 UNIT/ML FlexTouch Pen Inject 20 Units into THE SKIN daily. THEN increase ONE unit daily UNTIL fasting BLOOD GLUCOSE <150 15 mL 1   amLODipine (NORVASC) 5 MG tablet Held as of 03/01/23 (Patient not taking: Reported on 03/08/2023)     No facility-administered medications prior to visit.    Review of Systems  Review of Systems  Constitutional: Negative.   HENT: Negative.    Respiratory: Negative.    Cardiovascular: Negative.    Physical Exam  BP 110/62 (BP Location: Left Arm, Cuff Size: Normal)   Pulse 64   Temp 98.3 F (36.8 C) (Temporal)   Ht 5\' 10"  (1.778 m)   Wt 202 lb (91.6 kg)   SpO2 98%   BMI 28.98 kg/m  Physical Exam Constitutional:      General: He is not in acute distress.    Appearance: Normal appearance. He is not ill-appearing.  HENT:     Head: Normocephalic and atraumatic.  Cardiovascular:     Rate and Rhythm: Normal rate and regular rhythm.  Pulmonary:     Effort:  Pulmonary effort is normal.     Breath sounds: Normal breath sounds.  Musculoskeletal:        General: Normal range of motion.  Skin:    General: Skin is warm and dry.  Neurological:     General: No focal deficit present.     Mental Status: He is alert and oriented to person, place, and time. Mental status is at baseline.  Psychiatric:        Mood and Affect: Mood normal.        Behavior: Behavior normal.        Thought Content: Thought content normal.  Judgment: Judgment normal.      Lab Results:  CBC    Component Value Date/Time   WBC 5.8 09/11/2022 1602   RBC 4.38 09/11/2022 1602   HGB 13.6 09/11/2022 1602   HCT 38.6 09/11/2022 1602   PLT 214 09/11/2022 1602   MCV 88.1 09/11/2022 1602   MCH 31.1 09/11/2022 1602   MCHC 35.2 09/11/2022 1602   RDW 12.8 09/11/2022 1602   LYMPHSABS 1,740 09/11/2022 1602   MONOABS 0.6 12/23/2021 1147   EOSABS 296 09/11/2022 1602   BASOSABS 17 09/11/2022 1602    BMET    Component Value Date/Time   NA 138 03/01/2023 0832   NA 138 09/21/2017 1156   K 4.5 03/01/2023 0832   CL 108 03/01/2023 0832   CO2 20 (L) 03/01/2023 0832   GLUCOSE 214 (H) 03/01/2023 0832   BUN 25 (H) 03/01/2023 0832   BUN 17 09/21/2017 1156   CREATININE 1.49 (H) 03/01/2023 0832   CREATININE 1.78 (H) 09/11/2022 1602   CALCIUM 9.6 03/01/2023 0832   GFRNONAA 53 (L) 03/01/2023 0832   GFRAA 42 (L) 05/05/2019 0522    BNP No results found for: "BNP"  ProBNP    Component Value Date/Time   PROBNP 8.0 01/09/2022 1205    Imaging: No results found.   Assessment & Plan:   Sleep apnea - Well controlled. Patient is 100% compliant with CPAP use. Current pressure 8cm h20. Residual AHI 0.5/hour. No changes. FU in 6 months or sooner if needed.   Sarcoidosis - Not currently active. CXR in June 2023 showing postsurgical changes right lung base.   Pre-operative respiratory examination - Exam benign. VSS. Patient is low risk for prolonged mechanical ventilation  or post op pulmonary complications. Ultimate clearance will be decided upon by surgeon/anesthesiology.   Major Pulmonary risks identified in the multifactorial risk analysis are but not limited to a) pneumonia; b) recurrent intubation risk; c) prolonged or recurrent acute respiratory failure needing mechanical ventilation; d) prolonged hospitalization; e) DVT/Pulmonary embolism; f) Acute Pulmonary edema  Recommend 1. Short duration of surgery as much as possible and avoid paralytic if possible 2. Recovery in step down or ICU with Pulmonary consultation if needed  3. DVT prophylaxis 4. Aggressive pulmonary toilet with o2, bronchodilatation, and incentive spirometry and early ambulation   1) RISK FOR PROLONGED MECHANICAL VENTILAION - > 48h  1A) Arozullah - Prolonged mech ventilation risk Arozullah Postperative Pulmonary Risk Score - for mech ventilation dependence >48h USAA, Ann Surg 2000, major non-cardiac surgery) Comment Score  Type of surgery - abd ao aneurysm (27), thoracic (21), neurosurgery / upper abdominal / vascular (21), neck (11) Shoulder surgery 6  Emergency Surgery - (11)  0  ALbumin < 3 or poor nutritional state - (9)  0  BUN > 30 -  (8)  0  Partial or completely dependent functional status - (7)  0  COPD -  (6)  0  Age - 60 to 69 (4), > 70  (6)  4  TOTAL  10  Risk Stratifcation scores  - < 10 (0.5%), 11-19 (1.8%), 20-27 (4.2%), 28-40 (10.1%), >40 (26.6%)  0.5% risk    1B) GUPTA - Prolonged Mech Vent Risk Score source Risk  Guptal post op prolonged mech ventilation > 48h or reintubation < 30 days - ACS 2007-2008 dataset - [email or web address] 0.3 % Risk of mechanical ventilation for >48 hrs after surgery, or unplanned intubation ?30 days of surgery    2) RISK FOR POST OP PNEUMONIA Score source  Risk  Chales Abrahams - Post Op Pnemounia risk  [employer] 0.3 % Risk of  postoperative pneumonia    R3) ISK FOR ANY POST-OP PULMONARY COMPLICATION Score source Risk  CANET/ARISCAT Score - risk for ANY/ALl pulmonary complications - > risk of in-hospital post-op pulmonary complications (composite including respiratory failure, respiratory infection, pleural effusion, atelectasis, pneumothorax, bronchospasm, aspiration pneumonitis) [employer]es - based on age, anemia, pulse ox, resp infection prior 30d, incision site, duration of surgery, and emergency v elective surgery Low risk 1.6% risk of in-hospital post-op pulmonary complications (composite including respiratory failure, respiratory infection, pleural effusion, atelectasis, pneumothorax, bronchospasm, aspiration pneumonitis)      Glenford Bayley, NP 03/08/2023

## 2023-03-08 NOTE — Assessment & Plan Note (Addendum)
-   Exam benign. VSS. Patient is low risk for prolonged mechanical ventilation or post op pulmonary complications. Ultimate clearance will be decided upon by surgeon/anesthesiology.   Major Pulmonary risks identified in the multifactorial risk analysis are but not limited to a) pneumonia; b) recurrent intubation risk; c) prolonged or recurrent acute respiratory failure needing mechanical ventilation; d) prolonged hospitalization; e) DVT/Pulmonary embolism; f) Acute Pulmonary edema  Recommend 1. Short duration of surgery as much as possible and avoid paralytic if possible 2. Recovery in step down or ICU with Pulmonary consultation if needed  3. DVT prophylaxis 4. Aggressive pulmonary toilet with o2, bronchodilatation, and incentive spirometry and early ambulation

## 2023-03-08 NOTE — Assessment & Plan Note (Signed)
-   Not currently active. CXR in June 2023 showing postsurgical changes right lung base.

## 2023-03-08 NOTE — Patient Instructions (Addendum)
Low risk prolonged mechanical ventilation or post op pulmonary complications Continue to wear CPAP, current pressure 8cm h20 Encourage early ambulation and incentive spirometer use after surgery  Follow-up 6 months with APP or sooner if needed

## 2023-03-08 NOTE — Assessment & Plan Note (Signed)
-   Well controlled. Patient is 100% compliant with CPAP use. Current pressure 8cm h20. Residual AHI 0.5/hour. No changes. FU in 6 months or sooner if needed.

## 2023-03-09 NOTE — Telephone Encounter (Signed)
OV notes and clearance form have been faxed back to [facility]. Nothing further needed at this time.  

## 2023-03-10 ENCOUNTER — Encounter (HOSPITAL_COMMUNITY): Payer: Self-pay | Admitting: Physician Assistant

## 2023-03-10 NOTE — Progress Notes (Signed)
Anesthesia Chart Review   Case: [id number] Date/Time: 03/18/23 0912   Procedure: SHOULDER ARTHROSCOPY WITH ROTATOR CUFF REPAIR AND SUBACROMIAL DECOMPRESSION (Right)   Anesthesia type: Choice   Pre-op diagnosis: RIGHT SHOULDER ROTATOR CUFF TEAR   Location: [facility] ROOM 07 / [facility]   Surgeons: Jones Broom, MD       DISCUSSION:62 y.o. never smoker with h/o HTN, sleep apnea on CPAP, sarcoidosis, CAD, CKD, right shoulder rotator cuff tear scheduled for above procedure 03/18/2023 with Dr. Jones Broom.   Pt last seen by pulmonology 03/08/2023. Per OV note, "Pre-operative respiratory examination - Exam benign. VSS. Patient is low risk for prolonged mechanical ventilation or post op pulmonary complications. Ultimate clearance will be decided upon by surgeon/anesthesiology.  Major Pulmonary risks identified in the multifactorial risk analysis are but not limited to a) pneumonia; b) recurrent intubation risk; c) prolonged or recurrent acute respiratory failure needing mechanical ventilation; d) prolonged hospitalization; e) DVT/Pulmonary embolism; f) Acute Pulmonary edema Recommend 1. Short duration of surgery as much as possible and avoid paralytic if possible 2. Recovery in step down or ICU with Pulmonary consultation if needed  3. DVT prophylaxis 4. Aggressive pulmonary toilet with o2, bronchodilatation, and incentive spirometry and early ambulation"  Per cardiology preoperative evaluation 03/02/2023, "Tony Manning is at low risk, from a cardiac standpoint, for his upcoming procedure: Right shoulder arthroscopy.  It is ok to proceed without further cardiac testing. Patient is presently on 81 mg of aspirin daily, would prefer him to continue the same unless you absolutely request that aspirin be stopped, then he can discontinue aspirin 1 week prior to the procedure and restart postprocedure when stable"  Low risk stress test 10/28/20.   VS: There were no vitals taken for this  visit.  PROVIDERS: Joaquim Nam, MD is PCP   Yates Decamp, MD is Cardiologist  LABS: Labs reviewed: Acceptable for surgery. (all labs ordered are listed, but only abnormal results are displayed)  Labs Reviewed - No data to display   IMAGES:   EKG:   CV: Echocardiogram 01/07/2022:  Normal LV systolic function with visual EF 60-65%. Left ventricle cavity  is normal in size. Normal left ventricular wall thickness. Normal global  wall motion. Normal diastolic filling pattern, normal LAP.  Mild (Grade I) mitral regurgitation.  Compared to 02/01/2018 mild MR is new otherwise no significant change.   Lexiscan Tetrofosmin stress test 10/28/2020: Lexiscan nuclear stress test performed using 1-day protocol.  SPECT imaging shows diaphragmatic tissue attenuation, but otherwise normal myocardial perfusion. Stress LVEF 63%. Low risk study.  Past Medical History:  Diagnosis Date   Anginal pain (HCC)    Basal cell carcinoma of face    Chest pain, unspecified    Chronic kidney disease    kidney stones   Coronary atherosclerosis of native coronary artery    s/p stent x5   Depression    Diabetes mellitus without mention of complication    Diverticulosis    Dyspnea    Dysrhythmia    Erectile dysfunction    Essential hypertension, benign    Family history of adverse reaction to anesthesia    GERD (gastroesophageal reflux disease)    Heart murmur    History of kidney stones    History of nephrolithiasis    Hyperlipidemia    Migraine    Myocardial infarction (HCC)    2016   Neuromuscular disorder (HCC)    Osteoarthritis    Pneumonia    Postsurgical percutaneous transluminal coronary angioplasty status  s/p stent x4   Sarcoidosis    Sleep apnea    uses CPAP   Unspecified sleep apnea    uses C-pap    Past Surgical History:  Procedure Laterality Date   ANTERIOR CERVICAL DECOMP/DISCECTOMY FUSION N/A 04/07/2017   Procedure: ANTERIOR CERVICAL DECOMPRESSION FUSION, CERVICAL  4-5 WITH INSTRUMENTATION AND ALLOGRAFT;  Surgeon: Estill Bamberg, MD;  Location: [facility];  Service: Orthopedics;  Laterality: N/A;  ANTERIOR CERVICAL DECOMPRESSION FUSION, CERVICAL 4-5 WITH INSTRUMENTATION AND ALLOGRAFT; REQUEST 2.5 HOURS AND FLIP ROOM   APPENDECTOMY  2005   CARPAL TUNNEL RELEASE     CERVICAL FUSION  2009   COLONOSCOPY     CORONARY ANGIOPLASTY WITH STENT PLACEMENT     2004   CORONARY STENT INTERVENTION N/A 10/16/2016   Procedure: Coronary Stent Intervention;  Surgeon: Yates Decamp, MD;  Location: [facility] INVASIVE CV LAB;  Service: Cardiovascular;  Laterality: N/A;   CYSTOSCOPY WITH RETROGRADE PYELOGRAM, URETEROSCOPY AND STENT PLACEMENT Right 05/12/2019   Procedure: CYSTOSCOPY WITH RIGHT RETROGRADE PYELOGRAM, URETEROSCOPY HOLMIUM LASER AND STENT PLACEMENT;  Surgeon: Crista Elliot, MD;  Location: [facility];  Service: Urology;  Laterality: Right;   ELBOW SURGERY     bilateral   HAND SURGERY Left    KNEE SURGERY Bilateral    Bilateral    LEFT HEART CATH AND CORONARY ANGIOGRAPHY N/A 10/16/2016   Procedure: Left Heart Cath and Coronary Angiography;  Surgeon: Yates Decamp, MD;  Location: [facility] INVASIVE CV LAB;  Service: Cardiovascular;  Laterality: N/A;   LEFT HEART CATHETERIZATION WITH CORONARY ANGIOGRAM N/A 06/14/2014   Procedure: LEFT HEART CATHETERIZATION WITH CORONARY ANGIOGRAM;  Surgeon: Pamella Pert, MD;  Location: [facility] CATH LAB;  Service: Cardiovascular;  Laterality: N/A;   LUNG BIOPSY     sarcoid   ROTATOR CUFF REPAIR     left   SHOULDER ARTHROSCOPY WITH SUBACROMIAL DECOMPRESSION Left 06/16/2018   Procedure: LEFT SHOULDER ARTHROSCOPY WITH ROTATOR CUFF DEBRIDEMENT AND  SUBACROMIAL DECOMPRESSION;  Surgeon: Jones Broom, MD;  Location: [facility];  Service: Orthopedics;  Laterality: Left;    MEDICATIONS: No current facility-administered medications for this encounter.    aspirin 81 MG chewable tablet   atorvastatin (LIPITOR) 40 MG tablet   carvedilol (COREG) 6.25 MG tablet    diclofenac Sodium (VOLTAREN) 1 % GEL   gabapentin (NEURONTIN) 300 MG capsule   glimepiride (AMARYL) 4 MG tablet   losartan (COZAAR) 100 MG tablet   metFORMIN (GLUCOPHAGE) 500 MG tablet   ondansetron (ZOFRAN) 4 MG tablet   oxyCODONE-acetaminophen (PERCOCET/ROXICET) 5-325 MG tablet   pantoprazole (PROTONIX) 40 MG tablet   Semaglutide, 1 MG/DOSE, (OZEMPIC, 1 MG/DOSE,) 4 MG/3ML SOPN   tamsulosin (FLOMAX) 0.4 MG CAPS capsule   TRESIBA FLEXTOUCH 100 UNIT/ML FlexTouch Pen   amLODipine (NORVASC) 5 MG tablet   Blood Glucose Monitoring Suppl (ONETOUCH VERIO) w/Device KIT   Cholecalciferol (VITAMIN D3) 50 MCG (2000 UT) CAPS   Continuous Glucose Sensor (FREESTYLE LIBRE 3 SENSOR) MISC   cyanocobalamin (VITAMIN B12) 1000 MCG tablet   cyclobenzaprine (FLEXERIL) 5 MG tablet   nitroGLYCERIN (NITROSTAT) 0.4 MG SL tablet   [facility], PA-C [facility] Pre-Surgical Testing [phone]

## 2023-03-10 NOTE — Anesthesia Preprocedure Evaluation (Deleted)
Anesthesia Evaluation    Airway        Dental   Pulmonary           Cardiovascular hypertension,      Neuro/Psych    GI/Hepatic   Endo/Other  diabetes    Renal/GU      Musculoskeletal   Abdominal   Peds  Hematology   Anesthesia Other Findings   Reproductive/Obstetrics                             Anesthesia Physical Anesthesia Plan  ASA:   Anesthesia Plan:    Post-op Pain Management:    Induction:   PONV Risk Score and Plan:   Airway Management Planned:   Additional Equipment:   Intra-op Plan:   Post-operative Plan:   Informed Consent:   Plan Discussed with:   Anesthesia Plan Comments: (See PAT note 03/10/2023)       Anesthesia Quick Evaluation

## 2023-03-15 ENCOUNTER — Telehealth: Payer: Self-pay

## 2023-03-15 ENCOUNTER — Encounter: Payer: Self-pay | Admitting: Internal Medicine

## 2023-03-15 ENCOUNTER — Ambulatory Visit (INDEPENDENT_AMBULATORY_CARE_PROVIDER_SITE_OTHER): Payer: Medicare HMO | Admitting: Internal Medicine

## 2023-03-15 ENCOUNTER — Encounter: Payer: Self-pay | Admitting: Cardiology

## 2023-03-15 ENCOUNTER — Ambulatory Visit: Payer: Medicare HMO | Admitting: Cardiology

## 2023-03-15 VITALS — BP 122/70 | HR 69 | Temp 97.9°F | Ht 70.0 in | Wt 203.0 lb

## 2023-03-15 VITALS — BP 130/65 | HR 66 | Resp 16 | Ht 70.0 in | Wt 204.0 lb

## 2023-03-15 DIAGNOSIS — I10 Essential (primary) hypertension: Secondary | ICD-10-CM

## 2023-03-15 DIAGNOSIS — N1831 Chronic kidney disease, stage 3a: Secondary | ICD-10-CM | POA: Diagnosis not present

## 2023-03-15 DIAGNOSIS — I129 Hypertensive chronic kidney disease with stage 1 through stage 4 chronic kidney disease, or unspecified chronic kidney disease: Secondary | ICD-10-CM | POA: Diagnosis not present

## 2023-03-15 DIAGNOSIS — I25118 Atherosclerotic heart disease of native coronary artery with other forms of angina pectoris: Secondary | ICD-10-CM | POA: Diagnosis not present

## 2023-03-15 DIAGNOSIS — I951 Orthostatic hypotension: Secondary | ICD-10-CM

## 2023-03-15 DIAGNOSIS — E1122 Type 2 diabetes mellitus with diabetic chronic kidney disease: Secondary | ICD-10-CM | POA: Diagnosis not present

## 2023-03-15 MED ORDER — GLIMEPIRIDE 2 MG PO TABS
2.0000 mg | ORAL_TABLET | Freq: Every day | ORAL | 0 refills | Status: DC
Start: 2023-03-15 — End: 2023-05-11

## 2023-03-15 NOTE — Progress Notes (Signed)
Primary Physician/Referring:  Joaquim Nam, MD  Patient ID: Tony Manning, male    DOB: 09-16-1960, 62 y.o.   MRN: [id number]  Chief Complaint  Patient presents with   low blood pressure    HPI: METIN OSWALD  is a 62 y.o. male male  with coronary artery disease status post PCI to RCA and LAD, long-standing degenerative disc disease and involving neck and shoulders, sciatica, hyperlipidemia, OSA on CPAP, DM with stage IIIa chronic kidney disease, hypertension.  He is now scheduled for left shoulder arthroplasty.  Made an appointment to see me due to dizziness and low blood pressure.  Patient was doing well until about a month ago he has noticed that fluctuation in blood pressure.  He has noticed dizziness, amlodipine and isosorbide mononitrate was discontinued.  Fortunately he has not had any recurrence of angina pectoris.  States that his dizzy spells have improved significantly.  He is accompanied by his wife.  Past Medical History:  Diagnosis Date   Anginal pain (HCC)    Basal cell carcinoma of face    Chest pain, unspecified    Chronic kidney disease    kidney stones   Coronary atherosclerosis of native coronary artery    s/p stent x5   Depression    Diabetes mellitus without mention of complication    Diverticulosis    Dyspnea    Dysrhythmia    Erectile dysfunction    Essential hypertension, benign    Family history of adverse reaction to anesthesia    GERD (gastroesophageal reflux disease)    Heart murmur    History of kidney stones    History of nephrolithiasis    Hyperlipidemia    Migraine    Myocardial infarction (HCC)    2016   Neuromuscular disorder (HCC)    Osteoarthritis    Pneumonia    Postsurgical percutaneous transluminal coronary angioplasty status    s/p stent x4   Sarcoidosis    Sleep apnea    uses CPAP   Unspecified sleep apnea    uses C-pap    Social History   Tobacco Use   Smoking status: Never   Smokeless tobacco: Former     Types: Chew    Quit date: 02/06/2016  Substance Use Topics   Alcohol use: No    Alcohol/week: 0.0 standard drinks of alcohol   Marital Status: Married   ROS  Review of Systems  Cardiovascular:  Negative for chest pain, dyspnea on exertion and leg swelling.  Musculoskeletal:  Positive for arthritis and back pain.  Gastrointestinal:  Negative for melena.    Objective  Blood pressure 130/65, pulse 66, resp. rate 16, height 5\' 10"  (1.778 m), weight 204 lb (92.5 kg), SpO2 99 %. Body mass index is 29.27 kg/m.     03/15/2023    2:58 PM 03/15/2023    2:03 PM 03/08/2023    1:35 PM  Vitals with BMI  Height 5\' 10"  5\' 10"  5\' 10"   Weight 204 lbs 203 lbs 202 lbs  BMI 29.27 29.13 28.98  Systolic 130 122 562  Diastolic 65 70 62  Pulse 66 69 64    Orthostatic VS for the past 72 hrs (Last 3 readings):  Orthostatic BP Patient Position BP Location Cuff Size Orthostatic Pulse  03/15/23 1512 129/68 Standing Left Arm Large 66  03/15/23 1511 129/65 Sitting Left Arm Large 65  03/15/23 1508 137/72 Supine Left Arm Normal 63    Physical Exam Neck:     Vascular:  No carotid bruit or JVD.  Cardiovascular:     Rate and Rhythm: Normal rate and regular rhythm.     Pulses: Intact distal pulses.     Heart sounds: Normal heart sounds. No murmur heard.    No gallop.  Pulmonary:     Effort: Pulmonary effort is normal.     Breath sounds: Normal breath sounds.  Abdominal:     General: Bowel sounds are normal.     Palpations: Abdomen is soft.  Musculoskeletal:     Right lower leg: No edema.     Left lower leg: No edema.    Laboratory examination:   Lab Results  Component Value Date   NA 138 03/01/2023   K 4.5 03/01/2023   CO2 20 (L) 03/01/2023   GLUCOSE 214 (H) 03/01/2023   BUN 25 (H) 03/01/2023   CREATININE 1.49 (H) 03/01/2023   CALCIUM 9.6 03/01/2023   GFR 47.53 (L) 01/09/2022   GFRNONAA 53 (L) 03/01/2023       Latest Ref Rng & Units 03/01/2023    8:32 AM 09/11/2022    4:02 PM 01/09/2022    12:05 PM  CMP  Glucose 70 - 99 mg/dL 191  478  295   BUN 8 - 23 mg/dL 25  21  19    Creatinine 0.61 - 1.24 mg/dL 6.21  3.08  6.57   Sodium 135 - 145 mmol/L 138  142  140   Potassium 3.5 - 5.1 mmol/L 4.5  4.5  4.9   Chloride 98 - 111 mmol/L 108  107  105   CO2 22 - 32 mmol/L 20  23  29    Calcium 8.9 - 10.3 mg/dL 9.6  9.7  9.6   Total Protein 6.1 - 8.1 g/dL  7.0    Total Bilirubin 0.2 - 1.2 mg/dL  0.5    AST 10 - 35 U/L  22    ALT 9 - 46 U/L  22        Latest Ref Rng & Units 09/11/2022    4:02 PM 12/23/2021   11:47 AM 06/09/2021    9:23 AM  CBC  WBC 3.8 - 10.8 Thousand/uL 5.8  5.9  7.2   Hemoglobin 13.2 - 17.1 g/dL 84.6  96.2  95.2   Hematocrit 38.5 - 50.0 % 38.6  39.6  40.9   Platelets 140 - 400 Thousand/uL 214  193.0  198.0    Lipid Panel     Component Value Date/Time   CHOL 103 09/11/2022 1602   TRIG 127 09/11/2022 1602   HDL 38 (L) 09/11/2022 1602   CHOLHDL 2.7 09/11/2022 1602   VLDL 14.6 06/09/2021 0923   LDLCALC 44 09/11/2022 1602      HEMOGLOBIN A1C Lab Results  Component Value Date   HGBA1C 7.2 (H) 03/01/2023   MPG 159.94 03/01/2023   TSH Lab Results  Component Value Date   TSH 1.34 01/27/2021     Cardiac Studies:   Coronary angiogram 10/16/2016: 3.0 x 38 mm onyx DES Prox RCA. 06/14/2014: Thrombectomy of the mid RCA & Stenting with 3.0 x 18 mm Xience Alpine DES. Stenting mid LAD 3.0 x 18 mm Xience Alpine DES. Patent Mid LAD 2.75x12 mm Taxus, 2.5x12 Distal RCA stent x 2 from 2004 and 2007. Normal LVEF. Diffuse CAD.  PCV MYOCARDIAL PERFUSION WITH LEXISCAN 10/30/2020 Lexiscan nuclear stress test performed using 1-day protocol. SPECT imaging shows diaphragmatic tissue attenuation, but otherwise normal myocardial perfusion. Stress LVEF 63%. Low risk study.  PCV ECHOCARDIOGRAM COMPLETE  01/07/2022  Narrative Echocardiogram 01/07/2022: Normal LV systolic function with visual EF 60-65%. Left ventricle cavity is normal in size. Normal left ventricular wall thickness.  Normal global wall motion. Normal diastolic filling pattern, normal LAP. Mild (Grade I) mitral regurgitation. Compared to 02/01/2018 mild MR is new otherwise no significant change.    EKG   EKG 03/15/2023: Normal sinus rhythm at rate of 67 bpm, poor R progression, probably normal variant.  Normal EKG.  No significant change from 05/06/2022.  Allergies   Allergies  Allergen Reactions   Ambien [Zolpidem] Other (See Comments)    Parasomnias, sleep walking   Lisinopril Cough   Tramadol Diarrhea    Current Outpatient Medications:    atorvastatin (LIPITOR) 40 MG tablet, TAKE ONE TABLET BY MOUTH EVERY MORNING, Disp: 90 tablet, Rfl: 2   Blood Glucose Monitoring Suppl (ONETOUCH VERIO) w/Device KIT, Use to check blood sugars TID. Dx: E11.9, Disp: 1 kit, Rfl: 0   carvedilol (COREG) 6.25 MG tablet, TAKE ONE TABLET BY MOUTH TWICE DAILY, Disp: 180 tablet, Rfl: 2   Continuous Glucose Sensor (FREESTYLE LIBRE 3 SENSOR) MISC, Apply sensor every 14 days as directed to monitor glucose continuously, Disp: 2 each, Rfl: 11   diclofenac Sodium (VOLTAREN) 1 % GEL, Apply 2-4 g topically 4 (four) times daily., Disp: 100 g, Rfl: 5   gabapentin (NEURONTIN) 300 MG capsule, 1-2 in the PM, sedation caution., Disp: , Rfl:    losartan (COZAAR) 100 MG tablet, TAKE ONE TABLET BY MOUTH EVERYDAY AT BEDTIME (Patient taking differently: Take 50 mg by mouth.), Disp: 90 tablet, Rfl: 1   metFORMIN (GLUCOPHAGE) 500 MG tablet, TAKE ONE TABLET BY MOUTH TWICE DAILY, Disp: 180 tablet, Rfl: 2   ondansetron (ZOFRAN) 4 MG tablet, Take 4 mg by mouth every 8 (eight) hours as needed for nausea or vomiting., Disp: , Rfl:    oxyCODONE-acetaminophen (PERCOCET/ROXICET) 5-325 MG tablet, Take 0.5-1 tablets by mouth every 6 (six) hours as needed for severe pain., Disp: , Rfl:    pantoprazole (PROTONIX) 40 MG tablet, TAKE ONE TABLET BY MOUTH EVERY MORNING, Disp: 90 tablet, Rfl: 1   tamsulosin (FLOMAX) 0.4 MG CAPS capsule, TAKE ONE CAPSULE BY MOUTH AT  BREAKFAST AND AT BEDTIME, Disp: 60 capsule, Rfl: 2   TRESIBA FLEXTOUCH 100 UNIT/ML FlexTouch Pen, Inject 20 Units into THE SKIN daily. THEN increase ONE unit daily UNTIL fasting BLOOD GLUCOSE <150, Disp: 15 mL, Rfl: 1   aspirin 81 MG chewable tablet, Chew 81 mg by mouth at bedtime. (Patient not taking: Reported on 03/15/2023), Disp: , Rfl:    Cholecalciferol (VITAMIN D3) 50 MCG (2000 UT) CAPS, Take 2 capsules (4,000 Units total) by mouth daily. (Patient not taking: Reported on 03/15/2023), Disp: 30 capsule, Rfl:    cyanocobalamin (VITAMIN B12) 1000 MCG tablet, Take 1 tablet (1,000 mcg total) by mouth daily. (Patient not taking: Reported on 03/15/2023), Disp: , Rfl:    glimepiride (AMARYL) 2 MG tablet, Take 1 tablet (2 mg total) by mouth daily with breakfast., Disp: 90 tablet, Rfl: 0   nitroGLYCERIN (NITROSTAT) 0.4 MG SL tablet, Place 1 tablet (0.4 mg total) under the tongue every 5 (five) minutes as needed for chest pain. (Patient not taking: Reported on 03/15/2023), Disp: 25 tablet, Rfl: 3   Semaglutide, 1 MG/DOSE, (OZEMPIC, 1 MG/DOSE,) 4 MG/3ML SOPN, Inject 1 mg into the skin every Sunday. (Patient not taking: Reported on 03/15/2023), Disp: , Rfl:    Assessment     ICD-10-CM   1. Primary hypertension  I10  EKG 12-Lead    2. Type 2 diabetes mellitus with stage 3a chronic kidney disease, without long-term current use of insulin (HCC)  E11.22 glimepiride (AMARYL) 2 MG tablet   N18.31     3. Coronary artery disease of native artery of native heart with stable angina pectoris (HCC)  I25.118      Medications Discontinued During This Encounter  Medication Reason   glimepiride (AMARYL) 4 MG tablet Reorder     Meds ordered this encounter  Medications   glimepiride (AMARYL) 2 MG tablet    Sig: Take 1 tablet (2 mg total) by mouth daily with breakfast.    Dispense:  90 tablet    Refill:  0    Recommendations:   DYER HUIZAR  is a 62 y.o. male  with coronary artery disease status post PCI to RCA  and LAD, long-standing degenerative disc disease and involving neck and shoulders, sciatica, hyperlipidemia, OSA on CPAP, DM with stage IIIa chronic kidney disease, hypertension.  He is now scheduled for left shoulder arthroplasty.  Made an appointment to see me due to dizziness and low blood pressure.  1. Primary hypertension Patient blood pressure is well-controlled, amlodipine and isosorbide mononitrate were discontinued due to dizziness.  His symptoms of dizziness has improved.  Suspect with weight loss, no further back pain and neck pain, blood pressure is now improved.  Previously used to have difficulty in controlling his blood pressure.  Advised him to continue present medications, to keep himself well-hydrated.  I also suspect some of the spells of dizziness could be related to hypoglycemia as well.  He has noticed his blood sugar to be low sometimes falling down to 60. - EKG 12-Lead  2. Type 2 diabetes mellitus with stage 3a chronic kidney disease, without long-term current use of insulin (HCC) Due to dizziness, patient has noticed low blood sugar many times around 60, I have recommended that we reduce the dose of the Amaryl to 2 mg daily.  Will follow-up with Dr. Para March for management of his diabetes.  In view of stage III chronic kidney disease, would recommend and consider either Farxiga or Jardiance 10 mg daily.  - glimepiride (AMARYL) 2 MG tablet; Take 1 tablet (2 mg total) by mouth daily with breakfast.  Dispense: 90 tablet; Refill: 0  3. Coronary artery disease of native artery of native heart with stable angina pectoris (HCC) From coronary artery disease standpoint, in spite of discontinuing amlodipine and isosorbide mononitrate, he has not had any recurrence of angina pectoris.  EKG is normal and his physical exam is normal.  His had a low risk nuclear stress test in January 2022, he is scheduled for left shoulder arthroplasty for which he should not have any contraindication.  I  will see him back on annual basis.   Yates Decamp, MD, [facility] 03/15/2023, 3:52 PM Office: [phone] Fax: [phone] Pager: [phone]

## 2023-03-15 NOTE — Telephone Encounter (Signed)
error 

## 2023-03-15 NOTE — Assessment & Plan Note (Signed)
New frontal headache that is mild Not clearly migrainous---he thinks it may be eye strain No worrisome neuro features and symptoms to suggest sinus infection May need further evaluation if persists

## 2023-03-15 NOTE — Assessment & Plan Note (Signed)
BP Readings from Last 3 Encounters:  03/15/23 122/70  03/08/23 110/62  03/01/23 116/73   This is better off the isosorbide and amlodipine and losartan down to 50mg  Will stick with this med dosing for now

## 2023-03-15 NOTE — Assessment & Plan Note (Signed)
No chest pain but has some nausea---not clearly anginal Asked him to check with Dr [name] before proceeding with planned elective shoulder surgery

## 2023-03-15 NOTE — Progress Notes (Signed)
Subjective:    Patient ID: Tony Manning, male    DOB: November 21, 1960, 62 y.o.   MRN: [id number]  HPI Here due to ongoing dizziness  Reviewed my note and then follow up with Dr Para March Off isosorbide Then taken off amlodipine---and cut the losartan in half (50mg )  Still has some dizziness---not all the time still Mild at times---rarely as bad as before (if bent over for a while and then gets up quick) Doesn't really notice it getting out of bed Always "hurts in my chest"---but nothing worse (thinks it is strain). No change in years  Has "annoying headache" in frontal area Not migraine Wonders if it is related to eyes---trouble focusing (has been building a deck and doing more work with harder vision issues)  Current Outpatient Medications on File Prior to Visit  Medication Sig Dispense Refill   aspirin 81 MG chewable tablet Chew 81 mg by mouth at bedtime.     atorvastatin (LIPITOR) 40 MG tablet TAKE ONE TABLET BY MOUTH EVERY MORNING 90 tablet 2   Blood Glucose Monitoring Suppl (ONETOUCH VERIO) w/Device KIT Use to check blood sugars TID. Dx: E11.9 1 kit 0   carvedilol (COREG) 6.25 MG tablet TAKE ONE TABLET BY MOUTH TWICE DAILY 180 tablet 2   Cholecalciferol (VITAMIN D3) 50 MCG (2000 UT) CAPS Take 2 capsules (4,000 Units total) by mouth daily. 30 capsule    Continuous Glucose Sensor (FREESTYLE LIBRE 3 SENSOR) MISC Apply sensor every 14 days as directed to monitor glucose continuously 2 each 11   cyanocobalamin (VITAMIN B12) 1000 MCG tablet Take 1 tablet (1,000 mcg total) by mouth daily.     diclofenac Sodium (VOLTAREN) 1 % GEL Apply 2-4 g topically 4 (four) times daily. 100 g 5   gabapentin (NEURONTIN) 300 MG capsule 1-2 in the PM, sedation caution.     glimepiride (AMARYL) 4 MG tablet Take 1 tablet (4 mg total) by mouth daily with breakfast.     losartan (COZAAR) 100 MG tablet TAKE ONE TABLET BY MOUTH EVERYDAY AT BEDTIME (Patient taking differently: Take 50 mg by mouth.) 90 tablet 1    metFORMIN (GLUCOPHAGE) 500 MG tablet TAKE ONE TABLET BY MOUTH TWICE DAILY 180 tablet 2   nitroGLYCERIN (NITROSTAT) 0.4 MG SL tablet Place 1 tablet (0.4 mg total) under the tongue every 5 (five) minutes as needed for chest pain. 25 tablet 3   ondansetron (ZOFRAN) 4 MG tablet Take 4 mg by mouth every 8 (eight) hours as needed for nausea or vomiting.     oxyCODONE-acetaminophen (PERCOCET/ROXICET) 5-325 MG tablet Take 0.5-1 tablets by mouth every 6 (six) hours as needed for severe pain.     pantoprazole (PROTONIX) 40 MG tablet TAKE ONE TABLET BY MOUTH EVERY MORNING 90 tablet 1   Semaglutide, 1 MG/DOSE, (OZEMPIC, 1 MG/DOSE,) 4 MG/3ML SOPN Inject 1 mg into the skin every Sunday.     tamsulosin (FLOMAX) 0.4 MG CAPS capsule TAKE ONE CAPSULE BY MOUTH AT BREAKFAST AND AT BEDTIME 60 capsule 2   TRESIBA FLEXTOUCH 100 UNIT/ML FlexTouch Pen Inject 20 Units into THE SKIN daily. THEN increase ONE unit daily UNTIL fasting BLOOD GLUCOSE <150 15 mL 1   No current facility-administered medications on file prior to visit.    Allergies  Allergen Reactions   Ambien [Zolpidem] Other (See Comments)    Parasomnias, sleep walking   Lisinopril Cough   Tramadol Diarrhea    Past Medical History:  Diagnosis Date   Anginal pain (HCC)    Basal  cell carcinoma of face    Chest pain, unspecified    Chronic kidney disease    kidney stones   Coronary atherosclerosis of native coronary artery    s/p stent x5   Depression    Diabetes mellitus without mention of complication    Diverticulosis    Dyspnea    Dysrhythmia    Erectile dysfunction    Essential hypertension, benign    Family history of adverse reaction to anesthesia    GERD (gastroesophageal reflux disease)    Heart murmur    History of kidney stones    History of nephrolithiasis    Hyperlipidemia    Migraine    Myocardial infarction (HCC)    2016   Neuromuscular disorder (HCC)    Osteoarthritis    Pneumonia    Postsurgical percutaneous  transluminal coronary angioplasty status    s/p stent x4   Sarcoidosis    Sleep apnea    uses CPAP   Unspecified sleep apnea    uses C-pap    Past Surgical History:  Procedure Laterality Date   ANTERIOR CERVICAL DECOMP/DISCECTOMY FUSION N/A 04/07/2017   Procedure: ANTERIOR CERVICAL DECOMPRESSION FUSION, CERVICAL 4-5 WITH INSTRUMENTATION AND ALLOGRAFT;  Surgeon: Estill Bamberg, MD;  Location: [facility] OR;  Service: Orthopedics;  Laterality: N/A;  ANTERIOR CERVICAL DECOMPRESSION FUSION, CERVICAL 4-5 WITH INSTRUMENTATION AND ALLOGRAFT; REQUEST 2.5 HOURS AND FLIP ROOM   APPENDECTOMY  2005   CARPAL TUNNEL RELEASE     CERVICAL FUSION  2009   COLONOSCOPY     CORONARY ANGIOPLASTY WITH STENT PLACEMENT     2004   CORONARY STENT INTERVENTION N/A 10/16/2016   Procedure: Coronary Stent Intervention;  Surgeon: Yates Decamp, MD;  Location: [facility] INVASIVE CV LAB;  Service: Cardiovascular;  Laterality: N/A;   CYSTOSCOPY WITH RETROGRADE PYELOGRAM, URETEROSCOPY AND STENT PLACEMENT Right 05/12/2019   Procedure: CYSTOSCOPY WITH RIGHT RETROGRADE PYELOGRAM, URETEROSCOPY HOLMIUM LASER AND STENT PLACEMENT;  Surgeon: Crista Elliot, MD;  Location: [facility] ORS;  Service: Urology;  Laterality: Right;   ELBOW SURGERY     bilateral   HAND SURGERY Left    KNEE SURGERY Bilateral    Bilateral    LEFT HEART CATH AND CORONARY ANGIOGRAPHY N/A 10/16/2016   Procedure: Left Heart Cath and Coronary Angiography;  Surgeon: Yates Decamp, MD;  Location: [facility] INVASIVE CV LAB;  Service: Cardiovascular;  Laterality: N/A;   LEFT HEART CATHETERIZATION WITH CORONARY ANGIOGRAM N/A 06/14/2014   Procedure: LEFT HEART CATHETERIZATION WITH CORONARY ANGIOGRAM;  Surgeon: Pamella Pert, MD;  Location: [facility] CATH LAB;  Service: Cardiovascular;  Laterality: N/A;   LUNG BIOPSY     sarcoid   ROTATOR CUFF REPAIR     left   SHOULDER ARTHROSCOPY WITH SUBACROMIAL DECOMPRESSION Left 06/16/2018   Procedure: LEFT SHOULDER ARTHROSCOPY WITH ROTATOR CUFF DEBRIDEMENT  AND  SUBACROMIAL DECOMPRESSION;  Surgeon: Jones Broom, MD;  Location: [facility] OR;  Service: Orthopedics;  Laterality: Left;    Family History  Problem Relation Age of Onset   Arthritis Mother    Depression Mother    Stroke Mother    Arthritis Father    Diabetes Father    Coronary artery disease Father    Colon cancer Neg Hx    Prostate cancer Neg Hx    Esophageal cancer Neg Hx    Rectal cancer Neg Hx    Stomach cancer Neg Hx     Social History   Socioeconomic History   Marital status: Married    Spouse name: Not on  file   Number of children: 2   Years of education: Not on file   Highest education level: Not on file  Occupational History   Occupation: disabled    Employer: unemployed  Tobacco Use   Smoking status: Never   Smokeless tobacco: Former    Types: Chew    Quit date: 02/06/2016  Vaping Use   Vaping Use: Never used  Substance and Sexual Activity   Alcohol use: No    Alcohol/week: 0.0 standard drinks of alcohol   Drug use: No   Sexual activity: Yes    Birth control/protection: Condom  Other Topics Concern   Not on file  Social History Narrative   Married, [phone] kids   On disability   Social Determinants of Health   Financial Resource Strain: Low Risk  (11/04/2022)   Overall Financial Resource Strain (CARDIA)    Difficulty of Paying Living Expenses: Not hard at all  Food Insecurity: No Food Insecurity (11/04/2022)   Hunger Vital Sign    Worried About Running Out of Food in the Last Year: Never true    Ran Out of Food in the Last Year: Never true  Transportation Needs: No Transportation Needs (11/04/2022)   PRAPARE - Transportation    Lack of Transportation (Medical): No    Lack of Transportation (Non-Medical): No  Physical Activity: Insufficiently Active (11/04/2022)   Exercise Vital Sign    Days of Exercise per Week: 5 days    Minutes of Exercise per Session: 20 min  Stress: No Stress Concern Present (11/04/2022)   [employer] of Occupational  Health - Occupational Stress Questionnaire    Feeling of Stress : Not at all  Social Connections: Moderately Isolated (11/04/2022)   Social Connection and Isolation Panel [NHANES]    Frequency of Communication with Friends and Family: Three times a week    Frequency of Social Gatherings with Friends and Family: More than three times a week    Attends Religious Services: Never    Database administrator or Organizations: No    Attends Banker Meetings: Never    Marital Status: Married  Catering manager Violence: Not At Risk (11/04/2022)   Humiliation, Afraid, Rape, and Kick questionnaire    Fear of Current or Ex-Partner: No    Emotionally Abused: No    Physically Abused: No    Sexually Abused: No   Review of Systems Some nausea now---more persistent now (4-5 times a day--not related to food---and not always exertional) Has CGM---monitors sugar regularly. Average 166 for past 7 days, 158 for 14 days (went off ozempic for surgery)    Objective:   Physical Exam Constitutional:      Appearance: Normal appearance.  HENT:     Head:     Comments: Mild frontal tenderness Cardiovascular:     Rate and Rhythm: Normal rate and regular rhythm.     Heart sounds: No murmur heard.    No gallop.  Pulmonary:     Effort: Pulmonary effort is normal.     Breath sounds: Normal breath sounds. No wheezing or rales.  Musculoskeletal:     Cervical back: Neck supple.     Right lower leg: No edema.     Left lower leg: No edema.  Lymphadenopathy:     Cervical: No cervical adenopathy.  Neurological:     Mental Status: He is alert.            Assessment & Plan:

## 2023-03-16 ENCOUNTER — Other Ambulatory Visit: Payer: Self-pay | Admitting: Family Medicine

## 2023-03-18 ENCOUNTER — Ambulatory Visit (HOSPITAL_COMMUNITY): Admission: RE | Admit: 2023-03-18 | Payer: Medicare HMO | Source: Home / Self Care | Admitting: Orthopedic Surgery

## 2023-03-18 ENCOUNTER — Encounter (HOSPITAL_COMMUNITY): Admission: RE | Payer: Self-pay | Source: Home / Self Care

## 2023-03-18 DIAGNOSIS — E1149 Type 2 diabetes mellitus with other diabetic neurological complication: Secondary | ICD-10-CM

## 2023-03-18 DIAGNOSIS — Z794 Long term (current) use of insulin: Secondary | ICD-10-CM

## 2023-03-18 SURGERY — SHOULDER ARTHROSCOPY WITH ROTATOR CUFF REPAIR AND SUBACROMIAL DECOMPRESSION
Anesthesia: Choice | Laterality: Right

## 2023-03-25 ENCOUNTER — Ambulatory Visit (INDEPENDENT_AMBULATORY_CARE_PROVIDER_SITE_OTHER): Payer: Medicare HMO | Admitting: Family Medicine

## 2023-03-25 ENCOUNTER — Encounter: Payer: Self-pay | Admitting: Family Medicine

## 2023-03-25 VITALS — BP 110/54 | HR 67 | Temp 97.2°F | Ht 70.0 in | Wt 201.0 lb

## 2023-03-25 DIAGNOSIS — I10 Essential (primary) hypertension: Secondary | ICD-10-CM

## 2023-03-25 DIAGNOSIS — Z794 Long term (current) use of insulin: Secondary | ICD-10-CM

## 2023-03-25 DIAGNOSIS — E1149 Type 2 diabetes mellitus with other diabetic neurological complication: Secondary | ICD-10-CM | POA: Diagnosis not present

## 2023-03-25 MED ORDER — GABAPENTIN 300 MG PO CAPS: ORAL_CAPSULE | ORAL | 1 refills | Status: DC

## 2023-03-25 MED ORDER — CARVEDILOL 6.25 MG PO TABS: 3.1250 mg | ORAL_TABLET | Freq: Two times a day (BID) | ORAL | Status: DC

## 2023-03-25 MED ORDER — LOSARTAN POTASSIUM 100 MG PO TABS: 50.0000 mg | ORAL_TABLET | Freq: Every day | ORAL | Status: DC

## 2023-03-25 MED ORDER — CYCLOBENZAPRINE HCL 5 MG PO TABS: 5.0000 mg | ORAL_TABLET | Freq: Every day | ORAL | 1 refills | Status: DC

## 2023-03-25 NOTE — Progress Notes (Unsigned)
Still with lower BP, lightheadedness. He has had SBP down to 98.  He wasn't able to afford jardiance in the past.  D/w pt about cards recommendations.  Sugar has been ~187 for the last 7 days.  Last A1c was 7.2, about 3 weeks ago.  He isn't having low sugars since he is back on ozempic.  He is still putting up with variable back pain and shoulder pain.    Meds, vitals, and allergies reviewed.   ROS: Per HPI unless specifically indicated in ROS section   Lightheaded on standing, with pulse increase from 63--->72.  Then sx resolved.    He needed refill on gabapentin and flexeril, rx sent, d/w pt.  He uses both at night with relief.   Meds, vitals, and allergies reviewed.   ROS: Per HPI unless specifically indicated in ROS section     Cut carvedilol back to 1/2 tab twice a day.

## 2023-03-25 NOTE — Patient Instructions (Addendum)
If you have low sugars then update [place] and stop glimepiride.   Cut carvedilol back to 1/2 tab twice a day.   See how you feel and let me know if your pulse is above 100.   Either way, update me about your BP and pulse.  Take care.  Glad to see you.  Schedule a yearly visit this fall.

## 2023-03-26 ENCOUNTER — Encounter: Payer: Self-pay | Admitting: Pharmacist

## 2023-03-26 NOTE — Progress Notes (Signed)
Patient previously followed by [facility] pharmacist. Per clinical review, no pharmacist appointment needed at this time. Will make pharmacy patient advocate aware of Ozempic medication assistance for re-enrollment for 2025. If patient needs assistance with Jardiance or any other medication per cards rec, please refer to pharmacy.

## 2023-03-28 NOTE — Assessment & Plan Note (Signed)
If he has any hypoglycemia he can stop glimepiride and update [place].

## 2023-03-28 NOTE — Assessment & Plan Note (Signed)
Likely still overtreated.  Discussed options. Cut carvedilol back to 1/2 tab twice a day.   He can see how he feels and let me know if his pulse is above 100.   Either way, update me about BP and pulse.

## 2023-03-30 ENCOUNTER — Telehealth: Payer: Self-pay | Admitting: Family Medicine

## 2023-03-30 NOTE — Telephone Encounter (Signed)
Allie from [facility] called in and stated that she needs new prescription for the losartan (COZAAR) 100 MG tablet and carvedilol (COREG) 6.25 MG tablet with the new instructions. She stated that they try not to split tablets, so can they prescription of Losartan be sent in as 50 MG and they Carvedilol be sent in as 3.125 MG. Thank you!

## 2023-03-31 MED ORDER — CARVEDILOL 3.125 MG PO TABS: 3.1250 mg | ORAL_TABLET | Freq: Two times a day (BID) | ORAL | 3 refills | Status: DC

## 2023-03-31 MED ORDER — LOSARTAN POTASSIUM 50 MG PO TABS: 50.0000 mg | ORAL_TABLET | Freq: Every day | ORAL | 3 refills | Status: DC

## 2023-03-31 NOTE — Telephone Encounter (Signed)
Done.  Please notify patient about the change as FYI.  Thanks.

## 2023-03-31 NOTE — Addendum Note (Signed)
Addended by: Joaquim Nam on: 03/31/2023 04:48 PM   Modules accepted: Orders

## 2023-03-31 NOTE — Telephone Encounter (Addendum)
Allie--following up, preparing package for delivery on Friday 7.26.24. Please follow-up

## 2023-04-01 NOTE — Telephone Encounter (Signed)
Spoke with pt notifying him of changes. Pt verbalizes understanding.

## 2023-04-02 ENCOUNTER — Encounter: Payer: Self-pay | Admitting: Family Medicine

## 2023-04-02 ENCOUNTER — Ambulatory Visit: Payer: Medicare HMO | Admitting: Family Medicine

## 2023-04-02 ENCOUNTER — Other Ambulatory Visit: Payer: Self-pay | Admitting: Orthopedic Surgery

## 2023-04-02 DIAGNOSIS — R42 Dizziness and giddiness: Secondary | ICD-10-CM

## 2023-04-02 NOTE — Progress Notes (Unsigned)
Still with lightheaded sensation. Same severity as prior but less often.  Can happen on standing.  Not happening with rolling over in bed.    His BP has been slightly higher on home checks.    He felt like he was in the midst of trying to pass a renal stone, more dysuria and lower abd pain yesterday.  Improved today.  He hasn't seen stone passage yet.    Meds, vitals, and allergies reviewed.   ROS: Per HPI unless specifically indicated in ROS section   Rrr Ctab Abd soft, not ttp

## 2023-04-02 NOTE — Patient Instructions (Signed)
Keep drinking plenty of fluid in the meantime.  When you urine symptoms improve, cut flomax back to 1 tab a day.  See if that helps and let me know after about 1 week.   Take care.  Glad to see you.

## 2023-04-03 DIAGNOSIS — R42 Dizziness and giddiness: Secondary | ICD-10-CM | POA: Insufficient documentation

## 2023-04-03 NOTE — Assessment & Plan Note (Signed)
Some better on lower dose of BP meds.  Unclear if sx exacerbated by flomax.  Keep drinking plenty of fluid in the meantime.  When urine symptoms improve/stone passes, cut flomax back to 1 tab a day.  See if that helps and let me know after about 1 week.  He agrees with plan.

## 2023-04-06 ENCOUNTER — Encounter: Payer: Medicare HMO | Admitting: Pharmacist

## 2023-04-06 NOTE — Progress Notes (Signed)
Anesthesia Review:  PCP: Otho Najjar LOV 04/02/23  Cardiologist : Chest x-ray : EKG : 03/15/23  Echo : 01/11/22  Stress test: 2022  Cardiac Cath :  2018  Activity level:  Sleep Study/ CPAP : Fasting Blood Sugar :      / Checks Blood Sugar -- times a day:   Blood Thinner/ Instructions /Last Dose: ASA / Instructions/ Last Dose :    DM- type  Hgba1c- 03/01/23- 7.2    04/02/23- kidney stone

## 2023-04-06 NOTE — Patient Instructions (Signed)
SURGICAL WAITING ROOM VISITATION  Patients having surgery or a procedure may have no more than 2 support people in the waiting area - these visitors may rotate.    Children under the age of 32 must have an adult with them who is not the patient.  Due to an increase in RSV and influenza rates and associated hospitalizations, children ages 28 and under may not visit patients in [facility] hospitals.  If the patient needs to stay at the hospital during part of their recovery, the visitor guidelines for inpatient rooms apply. Pre-op nurse will coordinate an appropriate time for 1 support person to accompany patient in pre-op.  This support person may not rotate.    Please refer to the [facility] website for the visitor guidelines for Inpatients (after your surgery is over and you are in a regular room).       Your procedure is scheduled on:  04/22/2023    Report to [facility] Main Entrance    Report to admitting at  1015 AM   Call this number if you have problems the morning of surgery [phone]   Do not eat food :After Midnight.   After Midnight you may have the following liquids until __0915____ AM DAY OF SURGERY  Water Non-Citrus Juices (without pulp, NO RED-Apple, White grape, White cranberry) Black Coffee (NO MILK/CREAM OR CREAMERS, sugar ok)  Clear Tea (NO MILK/CREAM OR CREAMERS, sugar ok) regular and decaf                             Plain Jell-O (NO RED)                                           Fruit ices (not with fruit pulp, NO RED)                                     Popsicles (NO RED)                                                               Sports drinks like Gatorade (NO RED)                      The day of surgery:  Drink ONE (1) Pre-Surgery Clear Ensure or G2 at 0915AM ( have completed by )  the morning of surgery. Drink in one sitting. Do not sip.  This drink was given to you during your hospital  pre-op appointment visit. Nothing else to  drink after completing the  Pre-Surgery Clear Ensure or G2.          If you have questions, please contact your surgeon's office.        Oral Hygiene is also important to reduce your risk of infection.                                    Remember - BRUSH YOUR TEETH THE MORNING OF SURGERY WITH  YOUR REGULAR TOOTHPASTE  DENTURES WILL BE REMOVED PRIOR TO SURGERY PLEASE DO NOT APPLY "Poly grip" OR ADHESIVES!!!   Do NOT smoke after Midnight   Stop all vitamins and herbal supplements 7 days before surgery.   Take these medicines the morning of surgery with A SIP OF WATER:   DO NOT TAKE ANY ORAL DIABETIC MEDICATIONS DAY OF YOUR SURGERY  Bring CPAP mask and tubing day of surgery.                              You may not have any metal on your body including hair pins, jewelry, and body piercing             Do not wear make-up, lotions, powders, perfumes/cologne, or deodorant  Do not wear nail polish including gel and S&S, artificial/acrylic nails, or any other type of covering on natural nails including finger and toenails. If you have artificial nails, gel coating, etc. that needs to be removed by a nail salon please have this removed prior to surgery or surgery may need to be canceled/ delayed if the surgeon/ anesthesia feels like they are unable to be safely monitored.   Do not shave  48 hours prior to surgery.               Men may shave face and neck.   Do not bring valuables to the hospital. [facility] IS NOT             RESPONSIBLE   FOR VALUABLES.   Contacts, glasses, dentures or bridgework may not be worn into surgery.   Bring small overnight bag day of surgery.   DO NOT BRING YOUR HOME MEDICATIONS TO THE HOSPITAL. PHARMACY WILL DISPENSE MEDICATIONS LISTED ON YOUR MEDICATION LIST TO YOU DURING YOUR ADMISSION IN THE HOSPITAL!    Patients discharged on the day of surgery will not be allowed to drive home.  Someone NEEDS to stay with you for the first 24 hours after  anesthesia.   Special Instructions: Bring a copy of your healthcare power of attorney and living will documents the day of surgery if you haven't scanned them before.              Please read over the following fact sheets you were given: IF YOU HAVE QUESTIONS ABOUT YOUR PRE-OP INSTRUCTIONS PLEASE CALL [phone]   If you received a COVID test during your pre-op visit  it is requested that you wear a mask when out in public, stay away from anyone that may not be feeling well and notify your surgeon if you develop symptoms. If you test positive for Covid or have been in contact with anyone that has tested positive in the last 10 days please notify you surgeon.    [facility] - Preparing for Surgery Before surgery, you can play an important role.  Because skin is not sterile, your skin needs to be as free of germs as possible.  You can reduce the number of germs on your skin by washing with CHG (chlorahexidine gluconate) soap before surgery.  CHG is an antiseptic cleaner which kills germs and bonds with the skin to continue killing germs even after washing. Please DO NOT use if you have an allergy to CHG or antibacterial soaps.  If your skin becomes reddened/irritated stop using the CHG and inform your nurse when you arrive at Short Stay. Do not shave (including legs and underarms) for  at least 48 hours prior to the first CHG shower.  You may shave your face/neck. Please follow these instructions carefully:  1.  Shower with CHG Soap the night before surgery and the  morning of Surgery.  2.  If you choose to wash your hair, wash your hair first as usual with your  normal  shampoo.  3.  After you shampoo, rinse your hair and body thoroughly to remove the  shampoo.                           4.  Use CHG as you would any other liquid soap.  You can apply chg directly  to the skin and wash                       Gently with a scrungie or clean washcloth.  5.  Apply the CHG Soap to your body ONLY FROM THE  NECK DOWN.   Do not use on face/ open                           Wound or open sores. Avoid contact with eyes, ears mouth and genitals (private parts).                       Wash face,  Genitals (private parts) with your normal soap.             6.  Wash thoroughly, paying special attention to the area where your surgery  will be performed.  7.  Thoroughly rinse your body with warm water from the neck down.  8.  DO NOT shower/wash with your normal soap after using and rinsing off  the CHG Soap.                9.  Pat yourself dry with a clean towel.            10.  Wear clean pajamas.            11.  Place clean sheets on your bed the night of your first shower and do not  sleep with pets. Day of Surgery : Do not apply any lotions/deodorants the morning of surgery.  Please wear clean clothes to the hospital/surgery center.  FAILURE TO FOLLOW THESE INSTRUCTIONS MAY RESULT IN THE CANCELLATION OF YOUR SURGERY PATIENT SIGNATURE_________________________________  NURSE SIGNATURE__________________________________  ________________________________________________________________________

## 2023-04-07 ENCOUNTER — Ambulatory Visit: Payer: Medicare HMO | Admitting: Orthopaedic Surgery

## 2023-04-08 ENCOUNTER — Ambulatory Visit (INDEPENDENT_AMBULATORY_CARE_PROVIDER_SITE_OTHER): Payer: Medicare HMO

## 2023-04-08 ENCOUNTER — Ambulatory Visit: Payer: Medicare HMO | Admitting: Podiatry

## 2023-04-08 DIAGNOSIS — M7742 Metatarsalgia, left foot: Secondary | ICD-10-CM | POA: Diagnosis not present

## 2023-04-08 DIAGNOSIS — M2042 Other hammer toe(s) (acquired), left foot: Secondary | ICD-10-CM

## 2023-04-12 ENCOUNTER — Inpatient Hospital Stay (HOSPITAL_COMMUNITY)
Admission: RE | Admit: 2023-04-12 | Discharge: 2023-04-12 | Disposition: A | Discharge status: Home / Self Care | Payer: Medicare HMO | Source: Ambulatory Visit

## 2023-04-16 NOTE — Progress Notes (Signed)
Subjective:   Patient ID: Tony Manning, male   DOB: 62 y.o.   MRN: [id number]   HPI Chief Complaint  Patient presents with   Foot Pain    L foot in the ball for 5 wks getting worse and radiating to the heel with a lot of walking    62 year old male presents the office with above concerns.  He does not report any injuries.  No swelling.   Review of Systems  All other systems reviewed and are negative.  Past Medical History:  Diagnosis Date   Anginal pain (HCC)    Basal cell carcinoma of face    Chest pain, unspecified    Chronic kidney disease    kidney stones   Coronary atherosclerosis of native coronary artery    s/p stent x5   Depression    Diabetes mellitus without mention of complication    Diverticulosis    Dyspnea    Dysrhythmia    Erectile dysfunction    Essential hypertension, benign    Family history of adverse reaction to anesthesia    GERD (gastroesophageal reflux disease)    Heart murmur    History of kidney stones    History of nephrolithiasis    Hyperlipidemia    Migraine    Myocardial infarction (HCC)    2016   Neuromuscular disorder (HCC)    Osteoarthritis    Pneumonia    Postsurgical percutaneous transluminal coronary angioplasty status    s/p stent x4   Sarcoidosis    Sleep apnea    uses CPAP   Unspecified sleep apnea    uses C-pap    Past Surgical History:  Procedure Laterality Date   ANTERIOR CERVICAL DECOMP/DISCECTOMY FUSION N/A 04/07/2017   Procedure: ANTERIOR CERVICAL DECOMPRESSION FUSION, CERVICAL 4-5 WITH INSTRUMENTATION AND ALLOGRAFT;  Surgeon: Estill Bamberg, MD;  Location: [facility] OR;  Service: Orthopedics;  Laterality: N/A;  ANTERIOR CERVICAL DECOMPRESSION FUSION, CERVICAL 4-5 WITH INSTRUMENTATION AND ALLOGRAFT; REQUEST 2.5 HOURS AND FLIP ROOM   APPENDECTOMY  2005   CARPAL TUNNEL RELEASE     CERVICAL FUSION  2009   COLONOSCOPY     CORONARY ANGIOPLASTY WITH STENT PLACEMENT     2004   CORONARY STENT INTERVENTION N/A 10/16/2016    Procedure: Coronary Stent Intervention;  Surgeon: Yates Decamp, MD;  Location: [facility] INVASIVE CV LAB;  Service: Cardiovascular;  Laterality: N/A;   CYSTOSCOPY WITH RETROGRADE PYELOGRAM, URETEROSCOPY AND STENT PLACEMENT Right 05/12/2019   Procedure: CYSTOSCOPY WITH RIGHT RETROGRADE PYELOGRAM, URETEROSCOPY HOLMIUM LASER AND STENT PLACEMENT;  Surgeon: Crista Elliot, MD;  Location: [facility] ORS;  Service: Urology;  Laterality: Right;   ELBOW SURGERY     bilateral   HAND SURGERY Left    KNEE SURGERY Bilateral    Bilateral    LEFT HEART CATH AND CORONARY ANGIOGRAPHY N/A 10/16/2016   Procedure: Left Heart Cath and Coronary Angiography;  Surgeon: Yates Decamp, MD;  Location: [facility] INVASIVE CV LAB;  Service: Cardiovascular;  Laterality: N/A;   LEFT HEART CATHETERIZATION WITH CORONARY ANGIOGRAM N/A 06/14/2014   Procedure: LEFT HEART CATHETERIZATION WITH CORONARY ANGIOGRAM;  Surgeon: Pamella Pert, MD;  Location: [facility] CATH LAB;  Service: Cardiovascular;  Laterality: N/A;   LUNG BIOPSY     sarcoid   ROTATOR CUFF REPAIR     left   SHOULDER ARTHROSCOPY WITH SUBACROMIAL DECOMPRESSION Left 06/16/2018   Procedure: LEFT SHOULDER ARTHROSCOPY WITH ROTATOR CUFF DEBRIDEMENT AND  SUBACROMIAL DECOMPRESSION;  Surgeon: Jones Broom, MD;  Location: [facility] OR;  Service:  Orthopedics;  Laterality: Left;     Current Outpatient Medications:    aspirin EC 81 MG tablet, Take 81 mg by mouth daily. Swallow whole., Disp: , Rfl:    atorvastatin (LIPITOR) 40 MG tablet, TAKE ONE TABLET BY MOUTH EVERY MORNING, Disp: 90 tablet, Rfl: 2   Blood Glucose Monitoring Suppl (ONETOUCH VERIO) w/Device KIT, Use to check blood sugars TID. Dx: E11.9, Disp: 1 kit, Rfl: 0   carvedilol (COREG) 3.125 MG tablet, Take 1 tablet (3.125 mg total) by mouth 2 (two) times daily., Disp: 180 tablet, Rfl: 3   Continuous Glucose Sensor (FREESTYLE LIBRE 3 SENSOR) MISC, Apply sensor every 14 days as directed to monitor glucose continuously, Disp: 2 each, Rfl: 11    cyclobenzaprine (FLEXERIL) 5 MG tablet, Take 1 tablet (5 mg total) by mouth at bedtime., Disp: 90 tablet, Rfl: 1   diclofenac Sodium (VOLTAREN) 1 % GEL, Apply 2-4 g topically 4 (four) times daily. (Patient taking differently: Apply 2-4 g topically 4 (four) times daily as needed (joint pain).), Disp: 100 g, Rfl: 5   gabapentin (NEURONTIN) 300 MG capsule, 1-2 in the PM, sedation caution. (Patient taking differently: Take 600 mg by mouth at bedtime.  sedation caution.), Disp: 180 capsule, Rfl: 1   glimepiride (AMARYL) 2 MG tablet, Take 1 tablet (2 mg total) by mouth daily with breakfast., Disp: 90 tablet, Rfl: 0   losartan (COZAAR) 50 MG tablet, Take 1 tablet (50 mg total) by mouth daily., Disp: 90 tablet, Rfl: 3   metFORMIN (GLUCOPHAGE) 500 MG tablet, TAKE ONE TABLET BY MOUTH TWICE DAILY, Disp: 180 tablet, Rfl: 2   nitroGLYCERIN (NITROSTAT) 0.4 MG SL tablet, Place 1 tablet (0.4 mg total) under the tongue every 5 (five) minutes as needed for chest pain., Disp: 25 tablet, Rfl: 3   ondansetron (ZOFRAN) 4 MG tablet, Take 4 mg by mouth every 8 (eight) hours as needed for nausea or vomiting., Disp: , Rfl:    oxyCODONE-acetaminophen (PERCOCET/ROXICET) 5-325 MG tablet, Take 0.5-1 tablets by mouth every 6 (six) hours as needed for severe pain., Disp: , Rfl:    pantoprazole (PROTONIX) 40 MG tablet, TAKE ONE TABLET BY MOUTH EVERY MORNING, Disp: 90 tablet, Rfl: 1   Semaglutide, 1 MG/DOSE, (OZEMPIC, 1 MG/DOSE,) 4 MG/3ML SOPN, Inject 1 mg into the skin every Sunday., Disp: , Rfl:    tamsulosin (FLOMAX) 0.4 MG CAPS capsule, TAKE ONE CAPSULE BY MOUTH AT BREAKFAST AND AT BEDTIME (Patient taking differently: Take 0.4 mg by mouth every evening.), Disp: 60 capsule, Rfl: 2   TRESIBA FLEXTOUCH 100 UNIT/ML FlexTouch Pen, Inject 20 Units into THE SKIN daily. THEN increase ONE unit daily UNTIL fasting BLOOD GLUCOSE <150, Disp: 15 mL, Rfl: 1  Allergies  Allergen Reactions   Ambien [Zolpidem] Other (See Comments)     Parasomnias, sleep walking   Lisinopril Cough   Tramadol Diarrhea           Objective:  Physical Exam  General: AAO x3, NAD  Dermatological: Skin is warm, dry and supple bilateral.  There are no open sores, no preulcerative lesions, no rash or signs of infection present.  Vascular: Dorsalis Pedis artery and Posterior Tibial artery pedal pulses are 2/4 bilateral with immedate capillary fill time.  There is no pain with calf compression, swelling, warmth, erythema.   Neruologic: Grossly intact via light touch bilateral.   Musculoskeletal: Tenderness palpation submetatarsal area as well as along the plantar aspect of the heel on the insertion of plantar fascia.  Not able to  appreciate any area of pinpoint tenderness.  There is no edema, erythema.  Flexor, extensor tendons intact.  Hammertoes present     Assessment:   Metatarsalgia, plantar fasciitis, hammertoe     Plan:  -Treatment options discussed including all alternatives, risks, and complications -Etiology of symptoms were discussed -X-rays were obtained and reviewed with the patient.  3 views of the foot were obtained.  No evidence of acute fracture.  Hammertoes are noted.  Calcaneal spur present. -Held off on a steroid injection due to upcoming shoulder surgery.  Discussed rest, icing and regular basis as well as shoes, good arch support.  Dispensed metatarsal offloading pads.  Vivi Barrack DPM

## 2023-04-20 ENCOUNTER — Other Ambulatory Visit: Payer: Medicare HMO | Admitting: *Deleted

## 2023-04-20 ENCOUNTER — Telehealth: Payer: Self-pay | Admitting: *Deleted

## 2023-04-20 ENCOUNTER — Telehealth: Payer: Medicare HMO

## 2023-04-20 ENCOUNTER — Ambulatory Visit (INDEPENDENT_AMBULATORY_CARE_PROVIDER_SITE_OTHER): Payer: Medicare HMO | Admitting: Family Medicine

## 2023-04-20 ENCOUNTER — Encounter: Payer: Self-pay | Admitting: Family Medicine

## 2023-04-20 VITALS — BP 118/60 | HR 72 | Temp 97.4°F | Ht 70.0 in | Wt 200.0 lb

## 2023-04-20 DIAGNOSIS — Z794 Long term (current) use of insulin: Secondary | ICD-10-CM

## 2023-04-20 DIAGNOSIS — E1149 Type 2 diabetes mellitus with other diabetic neurological complication: Secondary | ICD-10-CM | POA: Diagnosis not present

## 2023-04-20 DIAGNOSIS — R42 Dizziness and giddiness: Secondary | ICD-10-CM

## 2023-04-20 MED ORDER — TAMSULOSIN HCL 0.4 MG PO CAPS: 0.4000 mg | ORAL_CAPSULE | Freq: Every evening | ORAL | Status: DC

## 2023-04-20 NOTE — Patient Outreach (Signed)
   Care Management RN Visit Note   04/20/23 Name: Tony Manning MRN: [id number]      DOB: 03-03-1961  Subjective: Tony Manning is a 62 y.o. year old male who is a primary care patient of Crawford Givens MD. Telephone outreach for follow up, no answer to telephone.  An unsuccessful telephone outreach was attempted today to contact the patient about Care Management needs.    Plan:Telephone follow up appointment with care management team member scheduled for:  upon care guide rescheduling  Irving Shows [facility], BSN [facility] Health/ Ambulatory Care Management [phone]

## 2023-04-20 NOTE — Patient Instructions (Signed)
Cut gabapentin back to 1 tab at night.  Use the bedside vertigo exercise.  If you keep having diarrhea, cut back to 1 metformin a day.  Take care.  Glad to see you. Update me in a few days.

## 2023-04-20 NOTE — Progress Notes (Unsigned)
Still can get sensation of head spinning that is different from previous lightheadedness (which has gotten some better on less BP medicine). He has sensation of movement- the room itself isn't moving.   BP recently generally 120-130s/70s.    Still taking gabapentin at night, that helped neck pain.    He had diarrhea over the last 2 weeks but not today.  Still on metformin.    Meds, vitals, and allergies reviewed.   ROS: Per HPI unless specifically indicated in ROS section   Nad Ncat TM wnl B Neck supple, no LA Rrr Ctab Abd soft not ttp DHP positive for symptoms w/o nystagmus.  CN2-12 wnl

## 2023-04-21 DIAGNOSIS — R42 Dizziness and giddiness: Secondary | ICD-10-CM | POA: Insufficient documentation

## 2023-04-21 NOTE — Assessment & Plan Note (Signed)
This could be separate from previous lightheadedness that has gotten some better since he is on with blood pressure medication.  Could be exacerbated by gabapentin use. Cut gabapentin back to 1 tab at night.  Use the bedside vertigo exercise.  Update me in a few days.  He agrees with plan.

## 2023-04-21 NOTE — Assessment & Plan Note (Signed)
If he keeps having diarrhea, cut back to 1 metformin a day and update me as needed.  He agrees to plan.

## 2023-04-22 ENCOUNTER — Telehealth: Payer: Self-pay | Admitting: Family Medicine

## 2023-04-22 ENCOUNTER — Ambulatory Visit (HOSPITAL_COMMUNITY): Admission: RE | Admit: 2023-04-22 | Payer: Medicare HMO | Source: Home / Self Care | Admitting: Orthopedic Surgery

## 2023-04-22 ENCOUNTER — Encounter (HOSPITAL_COMMUNITY): Admission: RE | Payer: Self-pay | Source: Home / Self Care

## 2023-04-22 SURGERY — SHOULDER ARTHROSCOPY WITH ROTATOR CUFF REPAIR AND SUBACROMIAL DECOMPRESSION
Anesthesia: Choice | Laterality: Right

## 2023-04-22 NOTE — Telephone Encounter (Signed)
Patient called in wanting to let dr Para March to know he still having dizziness spells, occasionally it gets worse. Patient can be reach at [phone]

## 2023-04-23 ENCOUNTER — Encounter: Payer: Self-pay | Admitting: Family Medicine

## 2023-04-23 NOTE — Telephone Encounter (Signed)
I would try stopping gabapentin totally and see if that helps.  Offer vestibular rehab.  We can refer if patient consents.  Please let me know if I need to put in the referral.  Thanks.

## 2023-04-23 NOTE — Telephone Encounter (Signed)
LMTCB

## 2023-04-23 NOTE — Telephone Encounter (Signed)
Spoke with patient and gave Dr. Lianne Bushy recommendations; patient does not want to try to rehab yet but will call back if gets worse and wants to try it.

## 2023-04-23 NOTE — Telephone Encounter (Signed)
error 

## 2023-05-04 ENCOUNTER — Ambulatory Visit: Admission: RE | Admit: 2023-05-04 | Payer: Medicare HMO | Source: Ambulatory Visit

## 2023-05-04 ENCOUNTER — Other Ambulatory Visit: Payer: Self-pay | Admitting: Nurse Practitioner

## 2023-05-04 DIAGNOSIS — M25551 Pain in right hip: Secondary | ICD-10-CM

## 2023-05-04 DIAGNOSIS — S46009S Unspecified injury of muscle(s) and tendon(s) of the rotator cuff of unspecified shoulder, sequela: Secondary | ICD-10-CM | POA: Diagnosis not present

## 2023-05-04 DIAGNOSIS — M25511 Pain in right shoulder: Secondary | ICD-10-CM | POA: Diagnosis not present

## 2023-05-04 DIAGNOSIS — Z79891 Long term (current) use of opiate analgesic: Secondary | ICD-10-CM | POA: Diagnosis not present

## 2023-05-04 DIAGNOSIS — M545 Low back pain, unspecified: Secondary | ICD-10-CM | POA: Diagnosis not present

## 2023-05-04 DIAGNOSIS — M47812 Spondylosis without myelopathy or radiculopathy, cervical region: Secondary | ICD-10-CM | POA: Diagnosis not present

## 2023-05-06 ENCOUNTER — Ambulatory Visit: Payer: Medicare HMO | Admitting: Family Medicine

## 2023-05-06 ENCOUNTER — Encounter: Payer: Self-pay | Admitting: Family Medicine

## 2023-05-06 VITALS — BP 110/66 | HR 78 | Temp 97.7°F | Ht 70.0 in | Wt 208.0 lb

## 2023-05-06 DIAGNOSIS — E1149 Type 2 diabetes mellitus with other diabetic neurological complication: Secondary | ICD-10-CM

## 2023-05-06 DIAGNOSIS — Z794 Long term (current) use of insulin: Secondary | ICD-10-CM | POA: Diagnosis not present

## 2023-05-06 MED ORDER — TAMSULOSIN HCL 0.4 MG PO CAPS: 0.8000 mg | ORAL_CAPSULE | Freq: Every evening | ORAL | Status: DC

## 2023-05-06 MED ORDER — METFORMIN HCL 500 MG PO TABS: ORAL_TABLET | ORAL | Status: DC

## 2023-05-06 MED ORDER — OZEMPIC (1 MG/DOSE) 4 MG/3ML ~~LOC~~ SOPN: PEN_INJECTOR | SUBCUTANEOUS | Status: DC

## 2023-05-06 NOTE — Progress Notes (Signed)
He had to go back to 2 gabapentin at night for neck pain.  Off metformin due to GI upset.  He still has sx with 1 tab a day.  Has been off for 5 days.  Diarrhea.  No vomiting.  Some nausea.  No blood in stool.  No black stools.  Urgency after eating.  Diarrhea started about 3 weeks ago.   Has been taking ozempic 1 mg per week.  Sugar has been averaging ~160s recently, 120 this AM.  Less dizzy spells overall, d/w pt. Taking 20 units per day.   He had inc in LUTS with 1 dose of flomax per day.  Increased back to 2 tabs a day and stream improved.   Meds, vitals, and allergies reviewed.   ROS: Per HPI unless specifically indicated in ROS section   GEN: nad, alert and oriented HEENT: ncat NECK: supple w/o LA CV: rrr. PULM: ctab, no inc wob ABD: soft, +bs EXT: no edema SKIN: no acute rash

## 2023-05-06 NOTE — Patient Instructions (Addendum)
Hold ozempic and metformin for now.   If needed add 1 unit of insulin per day, if your AM sugar is above 150.   Update me in about 1 week.  Take care.  Glad to see you.

## 2023-05-07 ENCOUNTER — Ambulatory Visit: Payer: Medicare HMO | Admitting: Cardiology

## 2023-05-10 ENCOUNTER — Other Ambulatory Visit: Payer: Self-pay | Admitting: Family Medicine

## 2023-05-10 ENCOUNTER — Other Ambulatory Visit: Payer: Self-pay | Admitting: Cardiology

## 2023-05-10 DIAGNOSIS — E1122 Type 2 diabetes mellitus with diabetic chronic kidney disease: Secondary | ICD-10-CM

## 2023-05-10 NOTE — Assessment & Plan Note (Signed)
Concern for side effects related to current medications.  Discussed options. Hold ozempic and metformin for now.   If needed add 1 unit of insulin per day, if AM sugar is above 150.   Update me in about 1 week.  He agrees with plan.

## 2023-05-12 ENCOUNTER — Telehealth: Payer: Self-pay | Admitting: Family Medicine

## 2023-05-12 MED ORDER — INSULIN DEGLUDEC FLEXTOUCH 100 UNIT/ML ~~LOC~~ SOPN: PEN_INJECTOR | SUBCUTANEOUS | 10 refills | Status: DC

## 2023-05-12 NOTE — Telephone Encounter (Signed)
[facility] called to ask the max daily dose allowed for medication Insulin Degludec FlexTouch 100 UNIT/ML SOPN  to know how to bill insurance?

## 2023-05-12 NOTE — Telephone Encounter (Signed)
Sent rx for max 40 units per day.

## 2023-05-19 ENCOUNTER — Other Ambulatory Visit: Payer: Self-pay | Admitting: Family Medicine

## 2023-05-20 ENCOUNTER — Encounter: Payer: Self-pay | Admitting: Family Medicine

## 2023-05-20 ENCOUNTER — Ambulatory Visit (INDEPENDENT_AMBULATORY_CARE_PROVIDER_SITE_OTHER): Payer: Medicare HMO | Admitting: Family Medicine

## 2023-05-20 VITALS — BP 130/80 | HR 69 | Temp 97.8°F | Ht 70.0 in | Wt 200.4 lb

## 2023-05-20 DIAGNOSIS — Z794 Long term (current) use of insulin: Secondary | ICD-10-CM

## 2023-05-20 DIAGNOSIS — M25559 Pain in unspecified hip: Secondary | ICD-10-CM | POA: Diagnosis not present

## 2023-05-20 DIAGNOSIS — E1149 Type 2 diabetes mellitus with other diabetic neurological complication: Secondary | ICD-10-CM | POA: Diagnosis not present

## 2023-05-20 MED ORDER — OZEMPIC (1 MG/DOSE) 4 MG/3ML ~~LOC~~ SOPN: 1.0000 mg | PEN_INJECTOR | SUBCUTANEOUS | Status: DC

## 2023-05-20 MED ORDER — INSULIN DEGLUDEC FLEXTOUCH 100 UNIT/ML ~~LOC~~ SOPN: PEN_INJECTOR | SUBCUTANEOUS | 12 refills | Status: DC

## 2023-05-20 NOTE — Patient Instructions (Addendum)
Please cancel metformin rx.   I would keep adding 1 unit of insulin per day.  Stay off metformin.   Try icing your hip 5 minutes at a time instead of heat.     Plan on recheck in about 4 months.  Yearly visit.  Labs ahead of time.  Update me as needed in the meantime.

## 2023-05-20 NOTE — Progress Notes (Signed)
Diabetes:  Using medications without difficulties: yes, see below.  Hypoglycemic episodes: no Hyperglycemic episodes: no Feet problems: no tingling.   Blood Sugars averaging: ~200 recently.  eye exam within last year: due, d/w pt.   Prev A1c d/w pt.   On 30 units insulin, still on ozempic, glimepiride.  Off metformin.    Held ozempic and metformin in the meantime.  GI sx returned with retrial of metformin.  Restarted ozempic and no ADE on med.    He saw outside clinic about hip pain, pain laying on R side.  No sig findings on xray, d/w pt.  Tried heat, not ice.  D/w pt about icing.    Meds, vitals, and allergies reviewed.   ROS: Per HPI unless specifically indicated in ROS section   GEN: nad, alert and oriented HEENT: ncat NECK: supple w/o LA CV: rrr. PULM: ctab, no inc wob ABD: soft, +bs EXT: no edema SKIN: well perfused.

## 2023-05-23 DIAGNOSIS — M25559 Pain in unspecified hip: Secondary | ICD-10-CM | POA: Insufficient documentation

## 2023-05-23 NOTE — Assessment & Plan Note (Signed)
Discussed with patient about icing, instead of heat.  He can update me as needed.

## 2023-05-23 NOTE — Assessment & Plan Note (Signed)
Prev A1c d/w pt.   On 30 units insulin, still on ozempic, glimepiride.  Off metformin.    Held ozempic and metformin in the meantime.  GI sx returned with retrial of metformin.  Restarted ozempic and no ADE on med.    I would keep adding 1 unit of insulin per day.  Would stay off metformin.     Plan on recheck in about 4 months.  Yearly visit.  Labs ahead of time.  Update me as needed in the meantime.

## 2023-05-25 ENCOUNTER — Telehealth: Payer: Self-pay | Admitting: Family Medicine

## 2023-05-25 DIAGNOSIS — E1149 Type 2 diabetes mellitus with other diabetic neurological complication: Secondary | ICD-10-CM

## 2023-05-25 NOTE — Telephone Encounter (Signed)
Pt called stating Para March has him on Tresiba for his diabetes. Pt states Para March has the rx for 15 pens a month, costing him $70. Pt states pharmacy can give him 6 pens for $35. Pt states he cannot afford $70 due to the cost of his other meds. Pt requested a call back to discuss more. Call back # [phone]

## 2023-05-26 MED ORDER — INSULIN DEGLUDEC FLEXTOUCH 100 UNIT/ML ~~LOC~~ SOPN: PEN_INJECTOR | SUBCUTANEOUS | 12 refills | Status: DC

## 2023-05-26 NOTE — Telephone Encounter (Signed)
I sent rx for 6 pens.  I put in pharmacy referral to see if patient can get some help with this . Thanks.

## 2023-05-26 NOTE — Addendum Note (Signed)
Addended by: Joaquim Nam on: 05/26/2023 06:36 PM   Modules accepted: Orders

## 2023-05-27 ENCOUNTER — Other Ambulatory Visit (HOSPITAL_COMMUNITY): Payer: Self-pay

## 2023-05-27 ENCOUNTER — Telehealth: Payer: Self-pay

## 2023-05-27 ENCOUNTER — Other Ambulatory Visit: Payer: Medicare HMO | Admitting: Pharmacist

## 2023-05-27 DIAGNOSIS — E1149 Type 2 diabetes mellitus with other diabetic neurological complication: Secondary | ICD-10-CM

## 2023-05-27 MED ORDER — INSULIN DEGLUDEC FLEXTOUCH 100 UNIT/ML ~~LOC~~ SOPN
PEN_INJECTOR | SUBCUTANEOUS | 11 refills | Status: DC
  Filled 2023-05-27 – 2023-06-01 (×2): qty 12, 28d supply, fill #0

## 2023-05-27 NOTE — Progress Notes (Signed)
05/27/2023 Name: Tony Manning MRN: [id number] DOB: Oct 18, 1960  No chief complaint on file.   Tony Manning is a 62 y.o. year old male who presented for a telephone visit.   They were referred to the pharmacist by their PCP for assistance in managing  Medication access/Diabetes .    Subjective:  Care Team: Primary Care Provider: Joaquim Nam, MD   Medication Access/Adherence  Current Pharmacy:  [facility] - [address], [place] - [address] [place] [address] Phone: [phone] Fax: [phone]   Patient reports affordability concerns with their medications: Yes   Pt called stating his insulin is now costing him $70/month (previously paying $35/month at [facility]).  Called patient's pharmacy to inquire about insulin copay. Pharmacy states that their pharmacy is unwilling to dispense insulin pens in quantities less than a full box of 5 pens. Therefore, they were billing a day-supply for 5 pens which is >30 days. Medicare $35 insulin cap applies only to 30 day supplies.   Diabetes:  Current medications:  glimepiride 2 mg daily with breakfast Tresiba 30 units daily, increase 1 units daily until fasting blood sugar is < 150 mg/dL (currently up to 32 units daily). Max 40 units per day. Ozempic 1 mg once weekly  Current medication access support: N/A. Patient reports "We make too much".   Reports that he thinks Ozempic is giving him trouble with his stomach. He thought his diarrhea was due to metformin but since stopping this (last Sunday), reports continued diarrhea. He plans to hold his next dose of Ozempic a few days to see if diarrhea resolves.   Reports BG are variable, some improved though some are still elevated. This morning FBG 142 mg/dL, thinks that yesterday was closer to 200 mg/dL.   Objective:  Lab Results  Component Value Date   HGBA1C 7.2 (H) 03/01/2023    Lab Results  Component Value Date    CREATININE 1.49 (H) 03/01/2023   BUN 25 (H) 03/01/2023   NA 138 03/01/2023   K 4.5 03/01/2023   CL 108 03/01/2023   CO2 20 (L) 03/01/2023    Lab Results  Component Value Date   CHOL 103 09/11/2022   HDL 38 (L) 09/11/2022   LDLCALC 44 09/11/2022   TRIG 127 09/11/2022   CHOLHDL 2.7 09/11/2022    Medications Reviewed Today   Medications were not reviewed in this encounter     Assessment/Plan:  Called and spoke to patient's new pharmacy, [facility]. They were attempting to bill a box of insulin (5 pens) which is >30 day supply and therefore was costing over the $35 Medicare price. Unfortunately, the pharmacy states that they will not open boxes of insulin (must dispense in quantities of 5 pens). Because of this, patient would have to fill his insulin at a pharmacy that will open insulin boxes. Rx refilled at [facility]. Refill sent for 28 day supply per insurance requirement.  Continue current medications, no changes.  Refill: Tresiba 30 units daily, increase 1 units daily until fasting blood sugar is < 150 mg/dL. Max 42 units per day. (4 pens/12 mL for 28 day supply)   [name], PharmD Clinical Pharmacist [facility] [phone]

## 2023-05-27 NOTE — Progress Notes (Signed)
Care Guide Note  05/27/2023 Name: Tony Manning MRN: [id number] DOB: Aug 22, 1961  Referred by: Joaquim Nam, MD Reason for referral : Care Coordination (Outreach to schedule with Pharm d )   Tony Manning is a 62 y.o. year old male who is a primary care patient of Joaquim Nam, MD. Tony Manning was referred to the pharmacist for assistance related to DM.    An unsuccessful telephone outreach was attempted today to contact the patient who was referred to the pharmacy team for assistance with medication assistance. Additional attempts will be made to contact the patient.   Penne Lash, RMA Care Guide [facility]  [place], [place] [address] Direct Dial: [phone] [email or web address]

## 2023-05-28 ENCOUNTER — Other Ambulatory Visit (HOSPITAL_COMMUNITY): Payer: Self-pay

## 2023-06-01 ENCOUNTER — Other Ambulatory Visit (HOSPITAL_COMMUNITY): Payer: Self-pay

## 2023-06-02 ENCOUNTER — Other Ambulatory Visit: Payer: Self-pay | Admitting: Family Medicine

## 2023-06-02 ENCOUNTER — Other Ambulatory Visit (HOSPITAL_COMMUNITY): Payer: Self-pay

## 2023-06-02 DIAGNOSIS — E1149 Type 2 diabetes mellitus with other diabetic neurological complication: Secondary | ICD-10-CM

## 2023-06-03 ENCOUNTER — Telehealth: Payer: Self-pay | Admitting: Family Medicine

## 2023-06-03 MED ORDER — TAMSULOSIN HCL 0.4 MG PO CAPS: 0.8000 mg | ORAL_CAPSULE | Freq: Every evening | ORAL | 1 refills | Status: DC

## 2023-06-03 NOTE — Telephone Encounter (Signed)
Prescription Request  06/03/2023  LOV: 05/20/2023  What is the name of the medication or equipment? tamsulosin (FLOMAX) 0.4 MG CAPS capsule   Have you contacted your pharmacy to request a refill? No   Which pharmacy would you like this sent to?   [id number] [name], [place] - [address] [place] [address] Phone: [phone] Fax: [phone]    Patient notified that their request is being sent to the clinical staff for review and that they should receive a response within 2 business days.   Please advise at [facility] [phone]

## 2023-06-03 NOTE — Telephone Encounter (Signed)
Called pharamcy states that [facility]. I have reached out to patient he is going to call [facility] back and see what is going on. He will give [place] a call if he needs anything further.

## 2023-06-03 NOTE — Telephone Encounter (Signed)
Erx sent

## 2023-06-03 NOTE — Telephone Encounter (Signed)
Pt called stating the pharmacy says they cannot refill meds until next month. Pt requested a call back with advice. Call back # [phone]

## 2023-06-06 NOTE — Progress Notes (Signed)
Thank you for your help with this patient.  Is there anything I need to do with his prescriptions at this point, in terms of sending his prescriptions?  If so please let me know.  Thanks.

## 2023-06-09 ENCOUNTER — Other Ambulatory Visit: Payer: Self-pay | Admitting: Family Medicine

## 2023-06-09 NOTE — Telephone Encounter (Signed)
Pt called stating [facility] says they never filled the meds, they just received a rx. Pt asked [facility]? Call back # [phone]

## 2023-06-10 NOTE — Telephone Encounter (Signed)
[facility] as originally asked. [facility] to see [facility]'t [facility] and pharmacy stated rx is ready for patient to pick up. Called and left VM for patient that [facility]

## 2023-06-11 ENCOUNTER — Ambulatory Visit (INDEPENDENT_AMBULATORY_CARE_PROVIDER_SITE_OTHER): Payer: Medicare HMO | Admitting: Family Medicine

## 2023-06-11 ENCOUNTER — Encounter: Payer: Self-pay | Admitting: Family Medicine

## 2023-06-11 VITALS — BP 120/62 | HR 68 | Temp 98.0°F | Ht 70.0 in | Wt 197.0 lb

## 2023-06-11 DIAGNOSIS — E1149 Type 2 diabetes mellitus with other diabetic neurological complication: Secondary | ICD-10-CM | POA: Diagnosis not present

## 2023-06-11 DIAGNOSIS — Z794 Long term (current) use of insulin: Secondary | ICD-10-CM | POA: Diagnosis not present

## 2023-06-11 DIAGNOSIS — R197 Diarrhea, unspecified: Secondary | ICD-10-CM

## 2023-06-11 LAB — CBC WITH DIFFERENTIAL/PLATELET
Basophils Absolute: 0 10*3/uL (ref 0.0–0.1)
Basophils Relative: 0.7 % (ref 0.0–3.0)
Eosinophils Absolute: 0.2 10*3/uL (ref 0.0–0.7)
Eosinophils Relative: 3 % (ref 0.0–5.0)
HCT: 47.1 % (ref 39.0–52.0)
Hemoglobin: 15.5 g/dL (ref 13.0–17.0)
Lymphocytes Relative: 18.6 % (ref 12.0–46.0)
Lymphs Abs: 1.3 10*3/uL (ref 0.7–4.0)
MCHC: 32.9 g/dL (ref 30.0–36.0)
MCV: 92.1 fL (ref 78.0–100.0)
Monocytes Absolute: 0.5 10*3/uL (ref 0.1–1.0)
Monocytes Relative: 6.5 % (ref 3.0–12.0)
Neutro Abs: 5 10*3/uL (ref 1.4–7.7)
Neutrophils Relative %: 71.2 % (ref 43.0–77.0)
Platelets: 217 10*3/uL (ref 150.0–400.0)
RBC: 5.12 Mil/uL (ref 4.22–5.81)
RDW: 13.8 % (ref 11.5–15.5)
WBC: 7 10*3/uL (ref 4.0–10.5)

## 2023-06-11 LAB — COMPREHENSIVE METABOLIC PANEL
ALT: 34 U/L (ref 0–53)
AST: 22 U/L (ref 0–37)
Albumin: 4.2 g/dL (ref 3.5–5.2)
Alkaline Phosphatase: 98 U/L (ref 39–117)
BUN: 19 mg/dL (ref 6–23)
CO2: 27 meq/L (ref 19–32)
Calcium: 9.5 mg/dL (ref 8.4–10.5)
Chloride: 105 meq/L (ref 96–112)
Creatinine, Ser: 1.29 mg/dL (ref 0.40–1.50)
GFR: 59.11 mL/min — ABNORMAL LOW (ref >60.00)
Glucose, Bld: 280 mg/dL — ABNORMAL HIGH (ref 70–99)
Potassium: 4.8 meq/L (ref 3.5–5.1)
Sodium: 138 meq/L (ref 135–145)
Total Bilirubin: 0.8 mg/dL (ref 0.2–1.2)
Total Protein: 7.4 g/dL (ref 6.0–8.3)

## 2023-06-11 LAB — HEMOGLOBIN A1C: Hgb A1c MFr Bld: 7.7 % — ABNORMAL HIGH (ref 4.6–6.5)

## 2023-06-11 MED ORDER — TRESIBA FLEXTOUCH 100 UNIT/ML ~~LOC~~ SOPN
PEN_INJECTOR | SUBCUTANEOUS | Status: DC
Start: 2023-06-11 — End: 2023-08-12

## 2023-06-11 NOTE — Patient Instructions (Addendum)
Stay off metformin and ozempic for now.  Increase insulin until AM sugar is less than 150.  Add 1 unit per day if needed.  Go to the lab on the way out.   If you have mychart we'll likely use that to update you.    Take care.  Glad to see you. Refer to GI.

## 2023-06-11 NOTE — Progress Notes (Unsigned)
He held ozempic in the meantime when GI sx returned.  Previously had diarrhea.  No relief with stopping ozempic.  Off for 2 weeks.  Taking 40 insulin, sugar has been ~215 recently.   Down to 174 this AM.  Diarrhea isn't bloody or greasy. Some abd cramping.  "Gurgling" in the abdomen.  Semi solid stool this AM.  Eating triggers diarrhea.  Diarrhea going on for months.  Tried imodium w/o relief.  No one else with similar persistent sx.  Temp 100 recently.    Meds, vitals, and allergies reviewed.   ROS: Per HPI unless specifically indicated in ROS section   GEN: nad, alert and oriented HEENT: ncat NECK: supple w/o LA CV: rrr. PULM: ctab, no inc wob ABD: soft, +bs EXT: no edema SKIN: Well-perfused.

## 2023-06-13 LAB — GASTROINTESTINAL PATHOGEN PNL
CampyloBacter Group: NOT DETECTED
Norovirus GI/GII: NOT DETECTED
Rotavirus A: NOT DETECTED
Salmonella species: NOT DETECTED
Shiga Toxin 1: NOT DETECTED
Shiga Toxin 2: NOT DETECTED
Shigella Species: NOT DETECTED
Vibrio Group: NOT DETECTED
Yersinia enterocolitica: NOT DETECTED

## 2023-06-13 NOTE — Assessment & Plan Note (Signed)
Of unclear source. Stay off metformin and ozempic for now, to limit variables. Will likely need to increase insulin until AM sugar is less than 150.  Add 1 unit per day if needed.  Discussed.  He agrees. Check stool studies. Refer back to GI.  He agrees with plan.  At this point still okay for outpatient follow-up.  Unclear if he has an infectious source versus pancreatic insufficiency related to diabetes.

## 2023-06-15 LAB — C. DIFFICILE GDH AND TOXIN A/B
GDH ANTIGEN: NOT DETECTED
MICRO NUMBER:: 15553963
SPECIMEN QUALITY:: ADEQUATE
TOXIN A AND B: NOT DETECTED

## 2023-06-15 LAB — FECAL FAT, QUALITATIVE: FECAL FAT, QUALITATIVE: NORMAL

## 2023-06-19 LAB — PANCREATIC ELASTASE, FECAL: Pancreatic Elastase-1, Stool: 419 ug/g

## 2023-06-21 ENCOUNTER — Telehealth: Payer: Self-pay | Admitting: Family Medicine

## 2023-06-21 NOTE — Telephone Encounter (Signed)
Can you please see about getting this patient seen urgently by GI?  Thank you.

## 2023-06-22 NOTE — Telephone Encounter (Signed)
Lauralee Evener D  You1 hour ago (12:18 PM)   CB Dr. Para March, I have sent an in-basket msg to [facility] Urgent Consult/Referrals Pool, as well as called their office to see if we can expedite getting him scheduled urgently. I am currently awaiting their response.  -Carla  =================

## 2023-06-28 ENCOUNTER — Telehealth: Payer: Self-pay | Admitting: Internal Medicine

## 2023-06-28 NOTE — Telephone Encounter (Signed)
Alcide Evener NP had an opening tomorrow. Pt scheduled to see Colleen tomorrow at 8:30am. Please let pt and or PCP know about appt. No phone number in phone note for PCP office.

## 2023-06-28 NOTE — Telephone Encounter (Signed)
Patients PCP called to get the patient seen as soon as possible for diarrhea. Advised the next available is in Jan she asked if the patient can be worked in. Please advise.

## 2023-06-28 NOTE — Telephone Encounter (Signed)
Patient called back to confirm appointment for tomorrow.

## 2023-06-29 ENCOUNTER — Encounter: Payer: Self-pay | Admitting: Nurse Practitioner

## 2023-06-29 ENCOUNTER — Ambulatory Visit (INDEPENDENT_AMBULATORY_CARE_PROVIDER_SITE_OTHER): Payer: Medicare HMO | Admitting: Nurse Practitioner

## 2023-06-29 ENCOUNTER — Ambulatory Visit: Payer: Medicare HMO | Admitting: Nurse Practitioner

## 2023-06-29 VITALS — BP 110/62 | HR 64 | Ht 70.0 in | Wt 203.4 lb

## 2023-06-29 VITALS — BP 110/62 | HR 64 | Ht 70.0 in | Wt 203.0 lb

## 2023-06-29 DIAGNOSIS — K219 Gastro-esophageal reflux disease without esophagitis: Secondary | ICD-10-CM

## 2023-06-29 DIAGNOSIS — R197 Diarrhea, unspecified: Secondary | ICD-10-CM

## 2023-06-29 DIAGNOSIS — Z860100 Personal history of colon polyps, unspecified: Secondary | ICD-10-CM

## 2023-06-29 MED ORDER — NA SULFATE-K SULFATE-MG SULF 17.5-3.13-1.6 GM/177ML PO SOLN: ORAL | 0 refills | Status: DC

## 2023-06-29 NOTE — Patient Instructions (Addendum)
Please purchase the following medications over the counter and take as directed: Imodium- 1 tablet twice daily as needed; hold if no bowel movement within 24 hours. Benefiber-1 tablespoon once daily as tolerated (to bulk up the stool).   You have been scheduled for an endoscopy/colonoscopy. Please follow written instructions given to you at your visit today.   Please pick up your prep supplies at the pharmacy within the next 1-3 days.  If you use inhalers (even only as needed), please bring them with you on the day of your procedure.  DO NOT TAKE 7 DAYS PRIOR TO TEST- Trulicity (dulaglutide) Ozempic, Wegovy (semaglutide) Mounjaro (tirzepatide) Bydureon Bcise (exanatide extended release)  DO NOT TAKE 1 DAY PRIOR TO YOUR TEST Rybelsus (semaglutide) Adlyxin (lixisenatide) Victoza (liraglutide) Byetta (exanatide) __________________________________________________________________________  If your blood pressure at your visit was 140/90 or greater, please contact your primary care physician to follow up on this.  _______________________________________________________  If you are age 63 or older, your body mass index should be between 23-30. Your There is no height or weight on file to calculate BMI. If this is out of the aforementioned range listed, please consider follow up with your Primary Care Provider.  If you are age 59 or younger, your body mass index should be between 19-25. Your There is no height or weight on file to calculate BMI. If this is out of the aformentioned range listed, please consider follow up with your Primary Care Provider.   ________________________________________________________  The [facility] providers would like to encourage you to use [facility] to communicate with providers for non-urgent requests or questions.  Due to long hold times on the telephone, sending your provider a message by [facility] may be a faster and more efficient way to get a response.   Please allow 48 business hours for a response.  Please remember that this is for non-urgent requests.  _______________________________________________________  Due to recent changes in healthcare laws, you may see the results of your imaging and laboratory studies on MyChart before your provider has had a chance to review them.  We understand that in some cases there may be results that are confusing or concerning to you. Not all laboratory results come back in the same time frame and the provider may be waiting for multiple results in order to interpret others.  Please give [place] 48 hours in order for your provider to thoroughly review all the results before contacting the office for clarification of your results.

## 2023-06-29 NOTE — Progress Notes (Addendum)
06/29/2023 [facility] [id number] 1960/12/14   CHIEF COMPLAINT: Diarrhea  HISTORY OF PRESENT ILLNESS: Stephanie Coup. Sassaman is a 62 year old male with a past medical history of depression, hypertension, coronary artery disease s/p coronary stent placement 2004 and 2007, s/p NSTEMI 06/2014 with RCA thrombectomy and DES placement to the mid RCA and mid LAD, DM type II, CKD stage IIIa, sleep apnea uses CPAP and colon polyps. He is known by Dr. Rhea Belton.  He presents to our office today as referred by Dr. Crawford Givens for further evaluation regarding diarrhea which started a few months ago.  He was started on Ozempic 09/2022 which was discontinued 2 to 3 weeks ago without improvement. Imodium was ineffective. Pancreatic elastase level 419. Fecal fat normal. C. Diff GDH antigen negative. C. Diff toxin A and B negative. GI pathogen panel negative.  He endorses having nonbloody diarrhea for the past 2 months.  No specific food triggers.  Stress level is elevated.  He denies starting any new medications within the past 3 to 4 months.  He describes having 6-9 episodes of diarrhea daily which sometimes occurs during the nighttime.  He also reported having episodes of diarrhea for several days on and off for the past 3 to 5 years and sometimes noted passing a black diarrhea bowel movement last occurred x 2 episodes on Saturday 05/27/2023.  No Pepto-Bismol or oral iron use.  He has a history of GERD.  He has occasional heartburn with notably more burping.  No dysphagia.  He takes Pantoprazole 40 mg once daily for several years.  He denies ever having an EGD. He takes ASA 81 mg daily.  No other NSAID use.  He endorses losing at least 20 pounds for the past year which he contributes to previously taking Ozempic.   He underwent a colonoscopy 03/2013 which identified one 5 mm tubular adenomatous polyp removed from the ascending colon.  A repeat colonoscopy in 5 years was recommended.  He was scheduled for a  direct colonoscopy 01/2021, however, this procedure was not done as he did not have a family member/driver to accompany him at that time.   History of CAD. Last seen by cardiologist Dr. [name] on 03/15/2023, at that time he noted having fluctuation in his BP with associated dizziness and Amlodipine and Isordil were discontinued and his dizziness significantly improved. Every now and then, he feels slightly dizzy when standing from a sitting position.  No chest pain or shortness of breath.      Latest Ref Rng & Units 06/11/2023    9:44 AM 09/11/2022    4:02 PM 12/23/2021   11:47 AM  CBC  WBC 4.0 - 10.5 K/uL 7.0  5.8  5.9   Hemoglobin 13.0 - 17.0 g/dL 65.7  84.6  96.2   Hematocrit 39.0 - 52.0 % 47.1  38.6  39.6   Platelets 150.0 - 400.0 K/uL 217.0  214  193.0        Latest Ref Rng & Units 06/11/2023    9:44 AM 03/01/2023    8:32 AM 09/11/2022    4:02 PM  CMP  Glucose 70 - 99 mg/dL 952  841  324   BUN 6 - 23 mg/dL 19  25  21    Creatinine 0.40 - 1.50 mg/dL 4.01  0.27  2.53   Sodium 135 - 145 mEq/L 138  138  142   Potassium 3.5 - 5.1 mEq/L 4.8  4.5  4.5   Chloride 96 -  112 mEq/L 105  108  107   CO2 19 - 32 mEq/L 27  20  23    Calcium 8.4 - 10.5 mg/dL 9.5  9.6  9.7   Total Protein 6.0 - 8.3 g/dL 7.4   7.0   Total Bilirubin 0.2 - 1.2 mg/dL 0.8   0.5   Alkaline Phos 39 - 117 U/L 98     AST 0 - 37 U/L 22   22   ALT 0 - 53 U/L 34   22    PCV MYOCARDIAL PERFUSION WITH LEXISCAN 10/30/2020 Lexiscan nuclear stress test performed using 1-day protocol. SPECT imaging shows diaphragmatic tissue attenuation, but otherwise normal myocardial perfusion. Stress LVEF 63%. Low risk study.   Echocardiogram 01/07/2022: Normal LV systolic function with visual EF 60-65%. Left ventricle cavity is normal in size. Normal left ventricular wall thickness. Normal global wall motion. Normal diastolic filling pattern, normal LAP. Mild (Grade I) mitral regurgitation. Compared to 02/01/2018 mild MR is new otherwise no  significant change  PASTS GI PROCEDURES:  Colonoscopy 03/13/2013 by Dr. Rhea Belton: Sessile polyp measuring 5 mm in size was found in the ascending colon, polypectomy was performed with a cold snare There was moderate diverticulosis noted in ascending colon and sigmoid colon -Recall colonoscopy 5 years Surgical [P], ascending colon polyps, bx - TUBULAR ADENOMA. NO HIGH GRADE DYSPLASIA OR MALIGNANCY IDENTIFIED.  Past Medical History:  Diagnosis Date   Anginal pain (HCC)    Basal cell carcinoma of face    Chest pain, unspecified    Chronic kidney disease    kidney stones   Coronary atherosclerosis of native coronary artery    s/p stent x5   Depression    Diabetes mellitus without mention of complication    Diverticulosis    Dyspnea    Dysrhythmia    Erectile dysfunction    Essential hypertension, benign    Family history of adverse reaction to anesthesia    GERD (gastroesophageal reflux disease)    Heart murmur    History of kidney stones    History of nephrolithiasis    Hyperlipidemia    Migraine    Myocardial infarction (HCC)    2016   Neuromuscular disorder (HCC)    Osteoarthritis    Pneumonia    Postsurgical percutaneous transluminal coronary angioplasty status    s/p stent x4   Sarcoidosis    Sleep apnea    uses CPAP   Unspecified sleep apnea    uses C-pap   Past Surgical History:  Procedure Laterality Date   ANTERIOR CERVICAL DECOMP/DISCECTOMY FUSION N/A 04/07/2017   Procedure: ANTERIOR CERVICAL DECOMPRESSION FUSION, CERVICAL 4-5 WITH INSTRUMENTATION AND ALLOGRAFT;  Surgeon: Estill Bamberg, MD;  Location: [facility] OR;  Service: Orthopedics;  Laterality: N/A;  ANTERIOR CERVICAL DECOMPRESSION FUSION, CERVICAL 4-5 WITH INSTRUMENTATION AND ALLOGRAFT; REQUEST 2.5 HOURS AND FLIP ROOM   APPENDECTOMY  2005   CARPAL TUNNEL RELEASE     CERVICAL FUSION  2009   COLONOSCOPY     CORONARY ANGIOPLASTY WITH STENT PLACEMENT     2004   CORONARY STENT INTERVENTION N/A 10/16/2016    Procedure: Coronary Stent Intervention;  Surgeon: Yates Decamp, MD;  Location: [facility] INVASIVE CV LAB;  Service: Cardiovascular;  Laterality: N/A;   CYSTOSCOPY WITH RETROGRADE PYELOGRAM, URETEROSCOPY AND STENT PLACEMENT Right 05/12/2019   Procedure: CYSTOSCOPY WITH RIGHT RETROGRADE PYELOGRAM, URETEROSCOPY HOLMIUM LASER AND STENT PLACEMENT;  Surgeon: Crista Elliot, MD;  Location: [facility]S;  Service: Urology;  Laterality: Right;   ELBOW SURGERY  bilateral   HAND SURGERY Left    KNEE SURGERY Bilateral    Bilateral    LEFT HEART CATH AND CORONARY ANGIOGRAPHY N/A 10/16/2016   Procedure: Left Heart Cath and Coronary Angiography;  Surgeon: Yates Decamp, MD;  Location: [facility] INVASIVE CV LAB;  Service: Cardiovascular;  Laterality: N/A;   LEFT HEART CATHETERIZATION WITH CORONARY ANGIOGRAM N/A 06/14/2014   Procedure: LEFT HEART CATHETERIZATION WITH CORONARY ANGIOGRAM;  Surgeon: Pamella Pert, MD;  Location: [facility] CATH LAB;  Service: Cardiovascular;  Laterality: N/A;   LUNG BIOPSY     sarcoid   ROTATOR CUFF REPAIR     left   SHOULDER ARTHROSCOPY WITH SUBACROMIAL DECOMPRESSION Left 06/16/2018   Procedure: LEFT SHOULDER ARTHROSCOPY WITH ROTATOR CUFF DEBRIDEMENT AND  SUBACROMIAL DECOMPRESSION;  Surgeon: Jones Broom, MD;  Location: [facility] OR;  Service: Orthopedics;  Laterality: Left;   Social History: He is married.  He has 1 son and 1 daughter.  He is on disability.  No alcohol use.  No drug use.  He drinks 1-2 caffeinated sodas daily.  Family History: Father with history of coronary artery disease and diabetes.  Mother with history of depression and CVA.  No known family history of esophageal, gastric or colorectal cancer.    Outpatient Encounter Medications as of 06/29/2023  Medication Sig   Na Sulfate-K Sulfate-Mg Sulf 17.5-3.13-1.6 GM/177ML SOLN Use as directed; may use generic; goodrx card if insurance will not cover generic   aspirin EC 81 MG tablet Take 81 mg by mouth daily. Swallow whole.    atorvastatin (LIPITOR) 40 MG tablet TAKE 1 TABLET BY MOUTH EVERY MORNING   carvedilol (COREG) 3.125 MG tablet Take 1 tablet (3.125 mg total) by mouth 2 (two) times daily.   Continuous Glucose Sensor (FREESTYLE LIBRE 3 SENSOR) MISC Apply sensor every 14 days as directed to monitor glucose continuously   cyclobenzaprine (FLEXERIL) 5 MG tablet Take 1 tablet (5 mg total) by mouth at bedtime.   diclofenac Sodium (VOLTAREN) 1 % GEL Apply 2-4 g topically 4 (four) times daily.   gabapentin (NEURONTIN) 300 MG capsule 1-2 in the PM, sedation caution.   glimepiride (AMARYL) 2 MG tablet TAKE 1 TABLET BY MOUTH EVERY MORNING WITH BREAKFAST   insulin degludec (TRESIBA FLEXTOUCH) 100 UNIT/ML FlexTouch Pen INJECT 40-50 UNITS SUBCUTANEOUSLY DAILY. THEN INCREASE 1 UNIT DAILY UNTIL FASTING BLOOD GLUCOSE <150   losartan (COZAAR) 50 MG tablet Take 1 tablet (50 mg total) by mouth daily.   nitroGLYCERIN (NITROSTAT) 0.4 MG SL tablet Place 1 tablet (0.4 mg total) under the tongue every 5 (five) minutes as needed for chest pain.   ondansetron (ZOFRAN) 4 MG tablet Take 4 mg by mouth every 8 (eight) hours as needed for nausea or vomiting.   oxyCODONE-acetaminophen (PERCOCET/ROXICET) 5-325 MG tablet Take 0.5-1 tablets by mouth every 6 (six) hours as needed for severe pain.   pantoprazole (PROTONIX) 40 MG tablet TAKE 1 TABLET BY MOUTH EVERY MORNING   tamsulosin (FLOMAX) 0.4 MG CAPS capsule Take 2 capsules (0.8 mg total) by mouth every evening.   No facility-administered encounter medications on file as of 06/29/2023.    REVIEW OF SYSTEMS:  Gen: Denies fever, sweats or chills. No unintentional weight loss.  CV: Denies chest pain, palpitations or edema. Resp: Denies cough, shortness of breath of hemoptysis.  GI: See HPI. GU: Urinary flow improved on Tamsulosin. MS: Denies joint pain, muscles aches or weakness. Derm: Denies rash, itchiness, skin lesions or unhealing ulcers. Psych: Denies depression, anxiety, memory loss or  confusion. Heme: Denies bruising, easy bleeding. Neuro:  Denies headaches, dizziness or paresthesias. Endo:  Denies any problems with DM, thyroid or adrenal function.  PHYSICAL EXAM: BP 110/62   Pulse 64  Weight 203 lbs. Height 5ft 10 inches. General: 62 year old male in no acute distress. Head: Normocephalic and atraumatic. Eyes:  Sclerae non-icteric, conjunctive pink. Ears: Normal auditory acuity. Mouth: Dentition intact. No ulcers or lesions.  Neck: Supple, no lymphadenopathy or thyromegaly.  Lungs: Clear bilaterally to auscultation without wheezes, crackles or rhonchi. Heart: Regular rate and rhythm. No murmur, rub or gallop appreciated.  Abdomen: Soft, nontender, nondistended. No masses. No hepatosplenomegaly. Normoactive bowel sounds x 4 quadrants.  Rectal: Deferred.  Musculoskeletal: Symmetrical with no gross deformities. Skin: Warm and dry. No rash or lesions on visible extremities. Extremities: No edema. Neurological: Alert oriented x 4, no focal deficits.  Psychological:  Alert and cooperative. Normal mood and affect.  ASSESSMENT AND PLAN:  62 year old male with diarrhea without abdominal pain x 2 months.  GI pathogen panel and C. difficile negative. Normal pancreatic elastase level.  No improvement after Ozempic discontinued 2 to 3 weeks ago. -Colonoscopy to rule out microscopic colitis benefits and risks discussed including risk with sedation, risk of bleeding, perforation and infection  -Imodium 1 tab p.o. twice daily, hold if no BM in 24 hours -Benefiber 1 tablespoon daily -Diet as tolerated   History of a tubular adenomatous polyp per colonoscopy 03/2013.  -Colonoscopy as ordered above  GERD, intermittent heartburn with burping. Infrequent black stools.  -EGD at time of colonoscopy benefits and risks discussed including risk with sedation, risk of bleeding, perforation and infection  -Continue Pantoprazole  40 mg p.o. daily -Patient to contact office if black  stools recur   History of coronary artery disease s/p MI, s/p multiple coronary stent placements (most recent DES/PCI 2015). On ASA. No angina.       CC:  Joaquim Nam, MD

## 2023-06-29 NOTE — Progress Notes (Unsigned)
06/29/2023 Tony Manning [id number] 21-May-1961   CHIEF COMPLAINT: Diarrhea   HISTORY OF PRESENT ILLNESS: Tony Manning. Duffie is a 62 year old male with a past medical history of depression, hypertension, coronary artery disease s/p stent placement 2004, 2007 and 2015, DM type II, CKD stage IIIa, sleep apnea, lupus, and colon polyps. He is known by Dr. Rhea Belton.  He presents to our office today as referred by Dr. Crawford Givens for further evaluation regarding diarrhea which started a few months ago. No improvement after stopping Ozempic x 2 weeks. Imodium was ineffective. Pancreatic elastase level 419. Fecal fat normal. C. Diff GDH antigen negative. C. Diff toxin A and B negative. GI pathogen panel negative.   He underwent a colonoscopy 03/2013 which identified one 5 mm tubular adenomatous polyp removed from the ascending colon.  A repeat colonoscopy in 5 years was recommended.  He was scheduled for direct colonoscopy 01/2021, however, the procedure was not done.  History of CAD. Last seen by cardiologist Dr. [name] on 03/15/2023, at that time he noted having fluctuation in his BP with associated dizziness and Amlodipine and Isordil were discontinued. His dizziness abated. No angina.   PCV MYOCARDIAL PERFUSION WITH LEXISCAN 10/30/2020 Lexiscan nuclear stress test performed using 1-day protocol. SPECT imaging shows diaphragmatic tissue attenuation, but otherwise normal myocardial perfusion. Stress LVEF 63%. Low risk study.  Echocardiogram 01/07/2022: Normal LV systolic function with visual EF 60-65%. Left ventricle cavity is normal in size. Normal left ventricular wall thickness. Normal global wall motion. Normal diastolic filling pattern, normal LAP. Mild (Grade I) mitral regurgitation. Compared to 02/01/2018 mild MR is new otherwise no significant change.     Latest Ref Rng & Units 06/11/2023    9:44 AM 03/01/2023    8:32 AM 09/11/2022    4:02 PM  CMP  Glucose 70 - 99 mg/dL 147  829   562   BUN 6 - 23 mg/dL 19  25  21    Creatinine 0.40 - 1.50 mg/dL 1.30  8.65  7.84   Sodium 135 - 145 mEq/L 138  138  142   Potassium 3.5 - 5.1 mEq/L 4.8  4.5  4.5   Chloride 96 - 112 mEq/L 105  108  107   CO2 19 - 32 mEq/L 27  20  23    Calcium 8.4 - 10.5 mg/dL 9.5  9.6  9.7   Total Protein 6.0 - 8.3 g/dL 7.4   7.0   Total Bilirubin 0.2 - 1.2 mg/dL 0.8   0.5   Alkaline Phos 39 - 117 U/L 98     AST 0 - 37 U/L 22   22   ALT 0 - 53 U/L 34   22        Latest Ref Rng & Units 06/11/2023    9:44 AM 09/11/2022    4:02 PM 12/23/2021   11:47 AM  CBC  WBC 4.0 - 10.5 K/uL 7.0  5.8  5.9   Hemoglobin 13.0 - 17.0 g/dL 69.6  29.5  28.4   Hematocrit 39.0 - 52.0 % 47.1  38.6  39.6   Platelets 150.0 - 400.0 K/uL 217.0  214  193.0     Colonoscopy 03/13/2013 by Dr. Rhea Belton: Sessile polyp measuring 5 mm in size was found in the ascending colon, polypectomy was performed with a cold snare There was moderate diverticulosis noted in ascending colon and sigmoid colon -Recall colonoscopy 5 years Surgical [P], ascending colon polyps, bx - TUBULAR ADENOMA. NO HIGH GRADE  DYSPLASIA OR MALIGNANCY IDENTIFIED.   Past Medical History:  Diagnosis Date   Anginal pain (HCC)    Basal cell carcinoma of face    Chest pain, unspecified    Chronic kidney disease    kidney stones   Coronary atherosclerosis of native coronary artery    s/p stent x5   Depression    Diabetes mellitus without mention of complication    Diverticulosis    Dyspnea    Dysrhythmia    Erectile dysfunction    Essential hypertension, benign    Family history of adverse reaction to anesthesia    GERD (gastroesophageal reflux disease)    Heart murmur    History of kidney stones    History of nephrolithiasis    Hyperlipidemia    Migraine    Myocardial infarction (HCC)    2016   Neuromuscular disorder (HCC)    Osteoarthritis    Pneumonia    Postsurgical percutaneous transluminal coronary angioplasty status    s/p stent x4   Sarcoidosis     Sleep apnea    uses CPAP   Unspecified sleep apnea    uses C-pap   Past Surgical History:  Procedure Laterality Date   ANTERIOR CERVICAL DECOMP/DISCECTOMY FUSION N/A 04/07/2017   Procedure: ANTERIOR CERVICAL DECOMPRESSION FUSION, CERVICAL 4-5 WITH INSTRUMENTATION AND ALLOGRAFT;  Surgeon: Estill Bamberg, MD;  Location: [facility] OR;  Service: Orthopedics;  Laterality: N/A;  ANTERIOR CERVICAL DECOMPRESSION FUSION, CERVICAL 4-5 WITH INSTRUMENTATION AND ALLOGRAFT; REQUEST 2.5 HOURS AND FLIP ROOM   APPENDECTOMY  2005   CARPAL TUNNEL RELEASE     CERVICAL FUSION  2009   COLONOSCOPY     CORONARY ANGIOPLASTY WITH STENT PLACEMENT     2004   CORONARY STENT INTERVENTION N/A 10/16/2016   Procedure: Coronary Stent Intervention;  Surgeon: Yates Decamp, MD;  Location: [facility] INVASIVE CV LAB;  Service: Cardiovascular;  Laterality: N/A;   CYSTOSCOPY WITH RETROGRADE PYELOGRAM, URETEROSCOPY AND STENT PLACEMENT Right 05/12/2019   Procedure: CYSTOSCOPY WITH RIGHT RETROGRADE PYELOGRAM, URETEROSCOPY HOLMIUM LASER AND STENT PLACEMENT;  Surgeon: Crista Elliot, MD;  Location: [facility] ORS;  Service: Urology;  Laterality: Right;   ELBOW SURGERY     bilateral   HAND SURGERY Left    KNEE SURGERY Bilateral    Bilateral    LEFT HEART CATH AND CORONARY ANGIOGRAPHY N/A 10/16/2016   Procedure: Left Heart Cath and Coronary Angiography;  Surgeon: Yates Decamp, MD;  Location: [facility] INVASIVE CV LAB;  Service: Cardiovascular;  Laterality: N/A;   LEFT HEART CATHETERIZATION WITH CORONARY ANGIOGRAM N/A 06/14/2014   Procedure: LEFT HEART CATHETERIZATION WITH CORONARY ANGIOGRAM;  Surgeon: Pamella Pert, MD;  Location: [facility] CATH LAB;  Service: Cardiovascular;  Laterality: N/A;   LUNG BIOPSY     sarcoid   ROTATOR CUFF REPAIR     left   SHOULDER ARTHROSCOPY WITH SUBACROMIAL DECOMPRESSION Left 06/16/2018   Procedure: LEFT SHOULDER ARTHROSCOPY WITH ROTATOR CUFF DEBRIDEMENT AND  SUBACROMIAL DECOMPRESSION;  Surgeon: Jones Broom, MD;  Location:  [facility] OR;  Service: Orthopedics;  Laterality: Left;   Social History:  Family History:    reports that he has never smoked. He quit smokeless tobacco use about 7 years ago.  His smokeless tobacco use included chew. He reports that he does not drink alcohol and does not use drugs. family history includes Arthritis in his father and mother; Coronary artery disease in his father; Depression in his mother; Diabetes in his father; Stroke in his mother. Allergies  Allergen Reactions   Ambien [  Zolpidem] Other (See Comments)    Parasomnias, sleep walking   Lisinopril Cough   Metformin And Related     GI upset   Tramadol Diarrhea      Outpatient Encounter Medications as of 06/29/2023  Medication Sig   aspirin EC 81 MG tablet Take 81 mg by mouth daily. Swallow whole.   atorvastatin (LIPITOR) 40 MG tablet TAKE 1 TABLET BY MOUTH EVERY MORNING   carvedilol (COREG) 3.125 MG tablet Take 1 tablet (3.125 mg total) by mouth 2 (two) times daily.   Continuous Glucose Sensor (FREESTYLE LIBRE 3 SENSOR) MISC Apply sensor every 14 days as directed to monitor glucose continuously   cyclobenzaprine (FLEXERIL) 5 MG tablet Take 1 tablet (5 mg total) by mouth at bedtime.   diclofenac Sodium (VOLTAREN) 1 % GEL Apply 2-4 g topically 4 (four) times daily.   gabapentin (NEURONTIN) 300 MG capsule 1-2 in the PM, sedation caution.   glimepiride (AMARYL) 2 MG tablet TAKE 1 TABLET BY MOUTH EVERY MORNING WITH BREAKFAST   insulin degludec (TRESIBA FLEXTOUCH) 100 UNIT/ML FlexTouch Pen INJECT 40-50 UNITS SUBCUTANEOUSLY DAILY. THEN INCREASE 1 UNIT DAILY UNTIL FASTING BLOOD GLUCOSE <150   losartan (COZAAR) 50 MG tablet Take 1 tablet (50 mg total) by mouth daily.   nitroGLYCERIN (NITROSTAT) 0.4 MG SL tablet Place 1 tablet (0.4 mg total) under the tongue every 5 (five) minutes as needed for chest pain.   ondansetron (ZOFRAN) 4 MG tablet Take 4 mg by mouth every 8 (eight) hours as needed for nausea or vomiting.    oxyCODONE-acetaminophen (PERCOCET/ROXICET) 5-325 MG tablet Take 0.5-1 tablets by mouth every 6 (six) hours as needed for severe pain.   pantoprazole (PROTONIX) 40 MG tablet TAKE 1 TABLET BY MOUTH EVERY MORNING   tamsulosin (FLOMAX) 0.4 MG CAPS capsule Take 2 capsules (0.8 mg total) by mouth every evening.   No facility-administered encounter medications on file as of 06/29/2023.     REVIEW OF SYSTEMS:  Gen: Denies fever, sweats or chills. No weight loss.  CV: Denies chest pain, palpitations or edema. Resp: Denies cough, shortness of breath of hemoptysis.  GI: Denies heartburn, dysphagia, stomach or lower abdominal pain. No diarrhea or constipation.  GU: Denies urinary burning, blood in urine, increased urinary frequency or incontinence. MS: Denies joint pain, muscles aches or weakness. Derm: Denies rash, itchiness, skin lesions or unhealing ulcers. Psych: Denies depression, anxiety, memory loss or confusion. Heme: Denies bruising, easy bleeding. Neuro:  Denies headaches, dizziness or paresthesias. Endo:  Denies any problems with DM, thyroid or adrenal function.  PHYSICAL EXAM: There were no vitals taken for this visit. General: in no acute distress. Head: Normocephalic and atraumatic. Eyes:  Sclerae non-icteric, conjunctive pink. Ears: Normal auditory acuity. Mouth: Dentition intact. No ulcers or lesions.  Neck: Supple, no lymphadenopathy or thyromegaly.  Lungs: Clear bilaterally to auscultation without wheezes, crackles or rhonchi. Heart: Regular rate and rhythm. No murmur, rub or gallop appreciated.  Abdomen: Soft, nontender, nondistended. No masses. No hepatosplenomegaly. Normoactive bowel sounds x 4 quadrants.  Rectal:  Musculoskeletal: Symmetrical with no gross deformities. Skin: Warm and dry. No rash or lesions on visible extremities. Extremities: No edema. Neurological: Alert oriented x 4, no focal deficits.  Psychological:  Alert and cooperative. Normal mood and  affect.  ASSESSMENT AND PLAN:  62 year old male with diarrhea -Colonoscopy benefits and risks discussed including risk with sedation, risk of bleeding, perforation and infection   History of a tubular adenomatous polyp per colonoscopy 03/2013.  -Colonoscopy as ordered above  History of coronary artery disease s/p MI, s/p multiple coronary stent placements     CC:  Joaquim Nam, MD

## 2023-07-01 NOTE — Progress Notes (Signed)
Appointment changed to new patient encounter. See other visit note entry.

## 2023-07-05 NOTE — Progress Notes (Signed)
Addendum: Reviewed and agree with assessment and management plan. Kadijah Shamoon M, MD  

## 2023-07-08 DIAGNOSIS — M47812 Spondylosis without myelopathy or radiculopathy, cervical region: Secondary | ICD-10-CM | POA: Diagnosis not present

## 2023-07-08 DIAGNOSIS — M25511 Pain in right shoulder: Secondary | ICD-10-CM | POA: Diagnosis not present

## 2023-07-08 DIAGNOSIS — S46009S Unspecified injury of muscle(s) and tendon(s) of the rotator cuff of unspecified shoulder, sequela: Secondary | ICD-10-CM | POA: Diagnosis not present

## 2023-07-08 DIAGNOSIS — M545 Low back pain, unspecified: Secondary | ICD-10-CM | POA: Diagnosis not present

## 2023-07-08 DIAGNOSIS — Z79891 Long term (current) use of opiate analgesic: Secondary | ICD-10-CM | POA: Diagnosis not present

## 2023-07-08 DIAGNOSIS — M25551 Pain in right hip: Secondary | ICD-10-CM | POA: Diagnosis not present

## 2023-07-13 ENCOUNTER — Telehealth: Payer: Self-pay | Admitting: Family Medicine

## 2023-07-13 DIAGNOSIS — E1149 Type 2 diabetes mellitus with other diabetic neurological complication: Secondary | ICD-10-CM

## 2023-07-13 MED ORDER — FREESTYLE LIBRE 3 PLUS SENSOR MISC
1 refills | Status: DC
Start: 2023-07-13 — End: 2024-01-10

## 2023-07-13 NOTE — Telephone Encounter (Signed)
Patient stopped by stating that the pharmacy no longer has Tony Manning 3,and they are needing a new rx sent in for libre 3 plus,that is the brand that they are carrying now.   [id number] [name], [place] - [address] Phone: [phone]  Fax: [phone]

## 2023-07-13 NOTE — Telephone Encounter (Signed)
New script sent in as requested.

## 2023-07-15 ENCOUNTER — Other Ambulatory Visit: Payer: Self-pay

## 2023-07-15 ENCOUNTER — Telehealth: Payer: Self-pay | Admitting: *Deleted

## 2023-07-15 ENCOUNTER — Telehealth: Payer: Self-pay | Admitting: Nurse Practitioner

## 2023-07-15 DIAGNOSIS — K921 Melena: Secondary | ICD-10-CM

## 2023-07-15 DIAGNOSIS — R197 Diarrhea, unspecified: Secondary | ICD-10-CM

## 2023-07-15 NOTE — Telephone Encounter (Signed)
Kaira, pls contact patient and send him to our lab for a CBC and BMP. Provide him with stool cards x 3 to complete asap.  Please verify if the patient is experiencing any abdominal pain. Pt to go to the ED if he develops frequent loose black tarry/melenic stools. THX.

## 2023-07-15 NOTE — Telephone Encounter (Signed)
Left message for patient to call back. Orders placed.  ?

## 2023-07-15 NOTE — Telephone Encounter (Signed)
Spoke with patient regarding NP recommendations. The soonest he can come by is tomorrow. Advised him where to go & discussed ED precautions in the meantime. Pt verbalized all understanding.

## 2023-07-15 NOTE — Telephone Encounter (Signed)
Inbound call from patient requesting to speak with a nurse in regards to him having black stool. Please advise.

## 2023-07-15 NOTE — Telephone Encounter (Signed)
Patient was told at last OV to call in if he has any further black stools. Patient states he's had 5-6 black, watery stools over the last couple weeks. Denies any pain. Currently scheduled for endo colon in January with Dr. Rhea Belton. Last seen with Jill Side, NP 06/29/23.

## 2023-07-15 NOTE — Progress Notes (Signed)
  Care Coordination Note  07/15/2023 Name: Tony Manning MRN: [id number] DOB: 08/28/1961  Tony Manning is a 62 y.o. year old male who is a primary care patient of Joaquim Nam, MD and is actively engaged with the care management team. I reached out to Agustina Caroli by phone today to assist with re-scheduling a follow up visit with the RN Case Manager  Follow up plan: Telephone appointment with care management team member scheduled for: 07/30/2023  Burman Nieves, [facility] Care Coordination Care Guide Direct Dial: [phone]

## 2023-07-16 ENCOUNTER — Ambulatory Visit: Payer: Medicare HMO | Admitting: Family Medicine

## 2023-07-18 ENCOUNTER — Other Ambulatory Visit: Payer: Self-pay | Admitting: Family Medicine

## 2023-07-19 NOTE — Telephone Encounter (Signed)
Refill request for gabapentin (NEURONTIN) 300 MG capsule   LOV - 06/11/23 Next OV - not scheduled Last refill - 03/25/23 #180/1

## 2023-07-29 ENCOUNTER — Encounter: Payer: Self-pay | Admitting: Family Medicine

## 2023-07-29 ENCOUNTER — Ambulatory Visit (INDEPENDENT_AMBULATORY_CARE_PROVIDER_SITE_OTHER): Payer: Medicare HMO | Admitting: Family Medicine

## 2023-07-29 VITALS — BP 124/68 | HR 70 | Temp 98.1°F | Ht 70.0 in | Wt 210.4 lb

## 2023-07-29 DIAGNOSIS — E1149 Type 2 diabetes mellitus with other diabetic neurological complication: Secondary | ICD-10-CM

## 2023-07-29 DIAGNOSIS — R197 Diarrhea, unspecified: Secondary | ICD-10-CM | POA: Diagnosis not present

## 2023-07-29 DIAGNOSIS — Z794 Long term (current) use of insulin: Secondary | ICD-10-CM

## 2023-07-29 DIAGNOSIS — Z23 Encounter for immunization: Secondary | ICD-10-CM

## 2023-07-29 MED ORDER — OZEMPIC (0.25 OR 0.5 MG/DOSE) 2 MG/3ML ~~LOC~~ SOPN: PEN_INJECTOR | SUBCUTANEOUS | Status: DC

## 2023-07-29 NOTE — Patient Instructions (Signed)
Flu shot today.  Let me see about getting ozempic set up in the meantime.  Take care.  Glad to see you.

## 2023-07-29 NOTE — Progress Notes (Signed)
Diabetes:  Using medications without difficulties: yes Hypoglycemic episodes: rare Hyperglycemic episodes: no Feet problems: some numbness.   Blood Sugars averaging: averaging 190 over the last week.   eye exam within last year: d/w pt about f/u.   Taking 50 units insulin.  Glimepiride 2mg  daily.   Last A1c 7.7.  Off metformin and ozempic but still having variable diarrhea.  Had prev GI eval d/w pt with colonoscopy pending.   D/w pt about about getting ozempic restarted through the office here at [facility].  Meds, vitals, and allergies reviewed.   ROS: Per HPI unless specifically indicated in ROS section   GEN: nad, alert and oriented HEENT: mucous membranes moist NECK: supple w/o LA CV: rrr. PULM: ctab, no inc wob ABD: soft, +bs EXT: no edema SKIN: no acute rash  Diabetic foot exam: Normal inspection No skin breakdown No calluses  Normal DP pulses Normal sensation to light touch and monofilament Nails normal Dropped MT head B

## 2023-07-30 ENCOUNTER — Ambulatory Visit: Payer: Self-pay

## 2023-07-30 NOTE — Patient Instructions (Signed)
Visit Information  Thank you for taking time to visit with me today. Please don't hesitate to contact me if I can be of assistance to you.   Following are the goals we discussed today:   Goals Addressed             This Visit's Progress    Management of health conditions       Interventions Today    Flowsheet Row Most Recent Value  Chronic Disease   Chronic disease during today's visit Diabetes, Hypertension (HTN)  General Interventions   General Interventions Discussed/Reviewed General Interventions Reviewed, Doctor Visits  Doctor Visits Discussed/Reviewed Doctor Visits Reviewed  [reviewed upcoming provider visits. Advised to keep follow up appointments with providers as recommended.]  Education Interventions   Education Provided Provided Education  [Advised to continue monitoring BP and notify provider for BP readings outside of established parameter.]  Provided Verbal Education On Blood Sugar Monitoring, When to see the doctor  [Advised to notify provider for frequent blood sugar readings <70 or >250.]  Pharmacy Interventions   Pharmacy Dicussed/Reviewed Pharmacy Topics Reviewed  Algis Downs to take medications as prescribed.]  Safety Interventions   Safety Discussed/Reviewed Fall Risk  [falls assessed. Fall prevention discussed.]                  Our next appointment is by telephone on 09/22/23 at 10 am  Please call the care guide team at [phone] if you need to cancel or reschedule your appointment.   If you are experiencing a Mental Health or Behavioral Health Crisis or need someone to talk to, please call the Suicide and Crisis Lifeline: 988 call 1-800-273-TALK (toll free, 24 hour hotline)  Patient verbalizes understanding of instructions and care plan provided today and agrees to view in MyChart. Active MyChart status and patient understanding of how to access instructions and care plan via MyChart confirmed with patient.     George Ina RN,BSN,CCM [facility]  [facility], [facility] coordinator / Case Manager Phone: [phone]

## 2023-07-30 NOTE — Patient Outreach (Signed)
  Care Coordination   Follow Up Visit Note   07/30/2023 Name: Tony Manning MRN: [id number] DOB: [birth year]  Tony PREBLE is a 62 y.o. year old male who sees Joaquim Nam, MD for primary care. I spoke with  Tony Manning by phone today. Patient transitioned to this RNCM from Irving Shows, RN case manager  What matters to the patients health and wellness today?  Patient states he has restarted Ozempic. He states he stopped Ozempic some time ago because he was having gastrointestinal issues.   He states after stopping the Ozempic he continued to have these issues so he is now scheduled to have an upper / lower endoscopy for futher evaluation in January 2025.   Patient reports his 14/30 day blood sugar average is around 179.  He states his blood sugars have gone up recently because he was not taking metformin or Ozempic.   Patient reports having a couple of BS <70 over the past few weeks however states they mostly trend high.   Patient reports having a few falls in the past year.  He states he has fallen off of ladders and states he has dealt with balance/ dizziness issues.  Patient states he was taken off a blood pressure medication sometime ago due to dizziness.  Patient states his blood pressures are ranging from 120-130's/70-90's.     Goals Addressed             This Visit's Progress    Management of health conditions       Interventions Today    Flowsheet Row Most Recent Value  Chronic Disease   Chronic disease during today's visit Diabetes, Hypertension (HTN)  General Interventions   General Interventions Discussed/Reviewed General Interventions Reviewed, Doctor Visits  Doctor Visits Discussed/Reviewed Doctor Visits Reviewed  [reviewed upcoming provider visits. Advised to keep follow up appointments with providers as recommended.]  Education Interventions   Education Provided Provided Education  [Advised to continue monitoring BP and notify provider for BP  readings outside of established parameter.]  Provided Verbal Education On Blood Sugar Monitoring, When to see the doctor  [Advised to notify provider for frequent blood sugar readings <70 or >250.]  Pharmacy Interventions   Pharmacy Dicussed/Reviewed Pharmacy Topics Reviewed  Algis Downs to take medications as prescribed.]  Safety Interventions   Safety Discussed/Reviewed Fall Risk  [falls assessed. Fall prevention discussed.]                  SDOH assessments and interventions completed:  Yes  SDOH Interventions Today    Flowsheet Row Most Recent Value  SDOH Interventions   Food Insecurity Interventions Intervention Not Indicated  Housing Interventions Intervention Not Indicated  Transportation Interventions Intervention Not Indicated  Utilities Interventions Intervention Not Indicated        Care Coordination Interventions:  Yes, provided   Follow up plan: Follow up call scheduled for 09/22/23    Encounter Outcome:  Patient Visit Completed   George Ina RN,BSN,CCM [facility]  [facility], [facility] coordinator / Case Manager Phone: [phone]

## 2023-08-01 NOTE — Assessment & Plan Note (Signed)
Discussed options.  Will see about getting ozempic restarted through the office here at [facility]. No change in meds in the meantime.

## 2023-08-01 NOTE — Assessment & Plan Note (Signed)
With f/u pending with GI.

## 2023-08-02 ENCOUNTER — Telehealth: Payer: Self-pay

## 2023-08-02 NOTE — Progress Notes (Signed)
   Care Guide Note  08/02/2023 Name: Tony Manning MRN: [id number] DOB: 01/28/1961  Referred by: Joaquim Nam, MD Reason for referral : Care Coordination (Outreach to schedule with Pharm d )   Tony Manning is a 62 y.o. year old male who is a primary care patient of Joaquim Nam, MD. Tony Manning was referred to the pharmacist for assistance related to DM.    An unsuccessful telephone outreach was attempted today to contact the patient who was referred to the pharmacy team for assistance with medication assistance. Additional attempts will be made to contact the patient.   Penne Lash , RMA     [facility]  [facility], [facility] Guide  Direct Dial: [phone]  Website: [email or web address]

## 2023-08-09 NOTE — Progress Notes (Signed)
   Care Guide Note  08/09/2023 Name: [name] MRN: [id number] DOB: [birth year]  Referred by: Joaquim Nam, MD Reason for referral : Care Coordination (Outreach to schedule with Pharm d )   Tony Manning is a 62 y.o. year old male who is a primary care patient of Joaquim Nam, MD. NAFTULI DHONDT was referred to the pharmacist for assistance related to DM.    Successful contact was made with the patient to discuss pharmacy services including being ready for the pharmacist to call at least 5 minutes before the scheduled appointment time, to have medication bottles and any blood sugar or blood pressure readings ready for review. The patient agreed to meet with the pharmacist via with the pharmacist via telephone visit on (date/time).  08/12/2023  Penne Lash , RMA     [facility], [facility] Guide  Direct Dial: [phone]  Website: [email or web address]

## 2023-08-12 ENCOUNTER — Other Ambulatory Visit: Payer: Medicare HMO

## 2023-08-12 NOTE — Progress Notes (Unsigned)
08/16/2023 Name: Tony Manning MRN: [id number] DOB: 1961/04/22  Subjective  Chief Complaint  Patient presents with   Diabetes    Reason for visit: ?  Tony Manning is a 62 y.o. male with a history of diabetes (type 2), who presents today for an initial diabetes pharmacotherapy visit.? Pertinent PMH also includes HTN, HLD, NSTEMI, angina, orthostasis, vertigo, renal stenosis, hx nephrolithiasis.  Known DM Complications:  neuropathy , diabetic retinopathy   Care Team: Primary Care Provider: Joaquim Nam, MD   Date of Last Diabetes Related Visit: with PCP on 07/29/23   Recent Summary of Change: 11/21: Off metformin and ozempic (c/f diarrhea) but still having variable diarrhea. D/w pt about restarting Ozempic, refer to pharmacy. No med changes.   Medication Access/Adherence: Prescription drug coverage: Payor: AETNA MEDICARE / Plan: AETNA MEDICARE HMO/PPO / Product Type: *No Product type* / .  - Reports that all medications are affordable. However, [facility] charged >$100 for insulin refill (we've established this is a day-supply issue)  - Current Patient Assistance: NovoNordisk (Ozempic) - Medication Adherence: Patient denies missing doses of their medication.    Since Last visit / History of Present Illness: ?  Patient reports no concerns with current regimen. Feels that his diarrhea is likely unrelated to Ozempic/metformin given still having intermittent diarrhea after being off of both medications for quite some time.   Reported DM Regimen: ?  glimepiride 2 mg daily with breakfast Tresiba 50 units daily   DM medications tried in the past:?  Ozempic (c/f worsening baseline diarrhea) Metofrmin IR (c/f worsening baseline diarrhea)   SMBG  Libre 3 : Connected to Clinic ?   CGM Report (Date Range 9/22 - 12/5) ABG 192 mg/dL; GMI 9.1%; Variability 25.4%; TIR 39%, high 50%, very high 11%, low 0%, very low 0%  Hypo/Hyperglycemia: ?  Symptoms of hypoglycemia  since last visit:? no  If yes, it was treated by: n/a  Symptoms of hyperglycemia since last visit:? no   DM Prevention:  Statin: Taking; high intensity.?  History of chronic kidney disease? no History of albuminuria? no, last UACR on 09/11/22 = 8 mg/g ACE/ARB - Taking losartan 50mg ; Urine MA/CR Ratio - normal, <30 Last eye exam: 05/15/22; Retinopathy Present Last foot exam: 07/29/2023 Tobacco Use: Never smoker Immunizations:? 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 no  History of hyperlipidemia? yes Current BMI: 30.1 kg/m2 (Ht 70 in, Wt 95.4 kg) Taking statin? yes; high intensity (atorvastatin 40) Taking aspirin? indicated (secondary prevention); Taking   Taking SGLT-2i? no Taking GLP- 1 RA? no     _______________________________________________  Objective    Review of Systems:? Limited in the setting of virtual visit  Constitutional:? No fever, chills or unintentional weight loss  Cardiovascular:? No chest pain or pressure, shortness of breath, dyspnea on exertion, orthopnea or LE edema  Pulmonary:? No cough or shortness of breath  GI:? diarrhea (intermittent chronic, improving), No nausea, vomiting, constipation, abdominal pain, dyspepsia, change in bowel habits  Endocrine:? No polyuria, polyphagia or blurred vision    Physical Examination:  Vitals:  Wt Readings from Last 3 Encounters:  07/29/23 210 lb 6.4 oz (95.4 kg)  06/29/23 203 lb (92.1 kg)  06/29/23 203 lb 6.4 oz (92.3 kg)   BP Readings from Last 3  Encounters:  07/29/23 124/68  06/29/23 110/62  06/29/23 110/62   Pulse Readings from Last 3 Encounters:  07/29/23 70  06/29/23 64  06/29/23 64     Labs:?  Lab Results  Component Value Date   HGBA1C 7.7 (H) 06/11/2023    HGBA1C 7.2 (H) 03/01/2023   HGBA1C 8.3 (H) 09/11/2022   GLUCOSE 280 (H) 06/11/2023   MICRALBCREAT 8 09/11/2022   MICRALBCREAT 0.8 08/15/2012   CREATININE 1.29 06/11/2023   CREATININE 1.49 (H) 03/01/2023   CREATININE 1.78 (H) 09/11/2022   GFR 59.11 (L) 06/11/2023   GFR 47.53 (L) 01/09/2022   GFR 60.28 12/23/2021    Lab Results  Component Value Date   CHOL 103 09/11/2022   LDLCALC 44 09/11/2022   LDLCALC 57 06/09/2021   LDLCALC 59 03/05/2020   HDL 38 (L) 09/11/2022   TRIG 127 09/11/2022   TRIG 73.0 06/09/2021   TRIG 89.0 03/05/2020   ALT 34 06/11/2023   ALT 22 09/11/2022   AST 22 06/11/2023   AST 22 09/11/2022      Chemistry      Component Value Date/Time   NA 138 06/11/2023 0944   NA 138 09/21/2017 1156   K 4.8 06/11/2023 0944   CL 105 06/11/2023 0944   CO2 27 06/11/2023 0944   BUN 19 06/11/2023 0944   BUN 17 09/21/2017 1156   CREATININE 1.29 06/11/2023 0944   CREATININE 1.78 (H) 09/11/2022 1602      Component Value Date/Time   CALCIUM 9.5 06/11/2023 0944   ALKPHOS 98 06/11/2023 0944   AST 22 06/11/2023 0944   ALT 34 06/11/2023 0944   BILITOT 0.8 06/11/2023 0944   BILITOT 0.3 09/21/2017 1156       The ASCVD Risk score (Arnett DK, et al., 2019) failed to calculate for the following reasons:   The patient has a prior MI or stroke diagnosis  Assessment and Plan:   Diabetes, type 2: uncontrolled per last A1c of 7.7% (06/11/23), increased from previous 7.2% (03/01/23) with goal <7% without hypoglycemia. Worsened glycemic control 2/2 stopping metformin and Ozempic d/t c/f diarrhea. Despite several weeks off of both, no change in diarrhea - pt feels likely unrelated to medication. Agreeable to resuming low-dose Ozempic though order deadline for Novo has passed 09-05-23). In the meantime, we discussed metformin re-trial given no change in GI sx with stopping. To be more cautious, we discussed switching to XR formulation given low incidence of adverse effects.  CGM data  shows ABG 192 mg/dL; GMI 1.6%; TIR 10%, high 50%, very high 11%, low 0% Current Regimen: glimepiride 2 mg qAM, Tresiba 50 units qd Restart metformin Extended Release 500 mg twice daily with food  (discussed starting 1 tab daily with increase to twice daily after several days if no c/f side effects) Reviewed s/sx/tx hypoglycemia January: Consider re-start Ozempic 0.25 mg weekly  Medication Access: Similar issue to last month at [facility]. Billing >30 day supply and therefore insulin copay >$35 Medicare price. [facility] - [facility] likely not updated with newest instructions. [facility]supp[facility].  Refill: Tresiba 50 units daily Novo Nordisk MAP renewal application started for 2025 (patient pages completed online with pt today. Provider pages placed in PCP box) Will collaborate with [facility] Med Access team for future medication assistance renewal/correspondences. No further action needed at this time.   Healthcare Maintenance:  Pneumococcal - Current status: DUE for PCV20. Last received PPSV23 >1 year ago. Age <65 with risk factor:  Diabetes  Shingles - Current status: Up to date Influenza - Current status: Up to date  Due to receive the following vaccines: Tdap (last >10 years ago) and PCV20  Follow Up Follow up with clinical pharmacist via phone in 2-3 weeks for metformin tolerability/titration Patient given direct line for questions regarding medication therapy  Future Appointments  Date Time Provider Department Center  08/25/2023  1:30 PM [facility] PHARMACIST [facility]  09/09/2023 10:50 AM [facility] ANNUAL WELLNESS VISIT 1 [facility]  09/15/2023  2:30 PM Pyrtle, Carie Caddy, MD [facility]  09/22/2023 10:00 AM Otho Ket, RN [facility] None   [name], PharmD Clinical Pharmacist [facility] [phone]

## 2023-08-12 NOTE — Progress Notes (Unsigned)
08/12/2023 Name: Tony Manning MRN: [id number] DOB: August 05, 1961  Subjective  No chief complaint on file.   Reason for visit: ?  Tony Manning is a 62 y.o. male with a history of diabetes (type 2), who presents today for an initial diabetes pharmacotherapy visit.? Pertinent PMH also includes ***.  Known DM Complications: {Blank multiple:19197::"retinopathy","peripheral neuropathy","nephropathy","hypoglycemia unawareness","severe hypoglycemia","DKA","autonomic neuropathy","gastroparesis","amputation","significant fear of hypoglycemia","no known complications"}   Care Team: Primary Care Provider: Joaquim Nam, MD   Date of Last Diabetes Related Visit: with PCP on 07/29/23 and with Pharmacist on ***  Recent Summary of Change: 11/21: Off metformin and ozempic but still having variable diarrhea. D/w pt about restarting Ozempic, refer to pharmacy. No med changes.   Medication Access/Adherence: Prescription drug coverage: Payor: AETNA MEDICARE / Plan: AETNA MEDICARE HMO/PPO / Product Type: *No Product type* / .  - Reports that all medications are *** affordable.  - Current Patient Assistance: None; Patient reports "We make too much" - Medication Adherence: Patient denies*** missing doses of their medication.    Since Last visit / History of Present Illness: ?  Patient reports implementing plan from last visit. Denies adverse effects with titration of ***.   Reports that he thinks Ozempic is giving him trouble with his stomach. He thought his diarrhea was due to metformin but since stopping this (last Sunday), reports continued diarrhea. He plans to hold his next dose of Ozempic a few days to see if diarrhea resolves.   Reported DM Regimen: ?  glimepiride 2 mg daily with breakfast Tresiba 32 units daily (***50 units reported at PCP visit?***) increase 1 units daily until fasting blood sugar is < 150 mg/dL Max 40 units per day. Ozempic 1 mg weekly   DM medications tried in  the past:?  *** (***) *** (***) *** (***)  SMBG  Libre 3 : Connected to Clinic ?     Hypo/Hyperglycemia: ?  Symptoms of hypoglycemia since last visit:? {Blank multiple:19197::"yes","no","n/a","***"}  If yes, it was treated by: {Blank multiple:19197::"***","n/a"}  Symptoms of hyperglycemia since last visit:? {Blank multiple:19197::"yes","no","n/a","***"} - {symptoms; hyperglycemia:17903}  Reported Diet: Patient typically eats *** meals per day.  Breakfast: *** Lunch: *** Dinner: *** Snacks: *** Beverages: ***  Exercise: ***  DM Prevention:  Statin: {Blank single:19197::"***","Taking","Not taking","Intolerant to","Declines"}; {Blank single:19197::"low intensity","moderate intensity","high intensity","n/a"}.?  History of chronic kidney disease? {Blank single:19197::"yes","no"} History of albuminuria? {Blank single:19197::"yes","no"}, last UACR on *** = *** mg/g Lab Results  Component Value Date   MICRALBCREAT 8 09/11/2022   MICRALBCREAT 0.8 08/15/2012   ACE/ARB - {Blank single:19197::"***","Taking","Not taking","Intolerant to","Declines"} ***; Urine MA/CR Ratio - {Blank single:19197::"normal","elevated urinary albumin excretion","n/a","***"}.  Last eye exam: ***; {lzdmeyeexam:31121} Lab Results  Component Value Date   HMDIABEYEEXA Retinopathy (A) 05/15/2022   Last foot exam: 07/29/2023 Tobacco Use: ***  Tobacco Use: Medium Risk (07/29/2023)   Patient History    Smoking Tobacco Use: Never    Smokeless Tobacco Use: Former    Passive Exposure: Not on file   Immunizations:? Flu: {LZDUEUPTODATE:31033} (Last: 07/29/2023); Pneumococcal: {LZVAXpneumococcal:31032:::0} - {LZVAXpneumo RISKAge19-64:31093}; Shingrix: {LZDUEUPTODATE:31033} (Last: 08/18/2022, 05/29/2022); Covid (***)  Cardiovascular Risk Reduction History of clinical ASCVD? {Blank single:19197::"yes","no"} The ASCVD Risk score (Arnett DK, et al., 2019) failed to calculate for the following reasons:   The patient has a  prior MI or stroke diagnosis History of heart failure? {Blank single:19197::"yes","no"} {HF MEDS CURRENT:31255} History of hyperlipidemia? {Blank single:19197::"yes","no"} Current BMI: *** kg/m2 (Ht *** in, Wt *** kg) Taking statin? {Blank single:19197::"yes","no","intolerant","declines"}; {Blank single:19197::"low intensity","moderate intensity","high intensity","n/a"} (***)  Taking aspirin? {Blank single:19197::"indicated (primary prevention)","indicated (secondary prevention)","not indicated","unclear if indicated"}; {Blank single:19197::"***","Taking","Not taking","Intolerant to","Declines"}   Taking SGLT-2i? {Blank single:19197::"yes","no"} Taking GLP- 1 RA? {Blank single:19197::"yes","no"}     _______________________________________________  Objective    Review of Systems:? Limited in the setting of virtual visit *** Constitutional:? No fever, chills or unintentional weight loss  Cardiovascular:? No chest pain or pressure, shortness of breath, dyspnea on exertion, orthopnea or LE edema  Pulmonary:? No cough or shortness of breath  GI:? No nausea, vomiting, constipation, diarrhea, abdominal pain, dyspepsia, change in bowel habits  Endocrine:? No polyuria, polyphagia or blurred vision  Psych:? No depression, anxiety, insomnia    Physical Examination:  Vitals:  Wt Readings from Last 3 Encounters:  07/29/23 210 lb 6.4 oz (95.4 kg)  06/29/23 203 lb (92.1 kg)  06/29/23 203 lb 6.4 oz (92.3 kg)   BP Readings from Last 3 Encounters:  07/29/23 124/68  06/29/23 110/62  06/29/23 110/62   Pulse Readings from Last 3 Encounters:  07/29/23 70  06/29/23 64  06/29/23 64     Labs:?  Lab Results  Component Value Date   HGBA1C 7.7 (H) 06/11/2023   HGBA1C 7.2 (H) 03/01/2023   HGBA1C 8.3 (H) 09/11/2022   GLUCOSE 280 (H) 06/11/2023   MICRALBCREAT 8 09/11/2022   MICRALBCREAT 0.8 08/15/2012   CREATININE 1.29 06/11/2023   CREATININE 1.49 (H) 03/01/2023   CREATININE 1.78 (H) 09/11/2022    GFR 59.11 (L) 06/11/2023   GFR 47.53 (L) 01/09/2022   GFR 60.28 12/23/2021    Lab Results  Component Value Date   CHOL 103 09/11/2022   LDLCALC 44 09/11/2022   LDLCALC 57 06/09/2021   LDLCALC 59 03/05/2020   HDL 38 (L) 09/11/2022   TRIG 127 09/11/2022   TRIG 73.0 06/09/2021   TRIG 89.0 03/05/2020   ALT 34 06/11/2023   ALT 22 09/11/2022   AST 22 06/11/2023   AST 22 09/11/2022      Chemistry      Component Value Date/Time   NA 138 06/11/2023 0944   NA 138 09/21/2017 1156   K 4.8 06/11/2023 0944   CL 105 06/11/2023 0944   CO2 27 06/11/2023 0944   BUN 19 06/11/2023 0944   BUN 17 09/21/2017 1156   CREATININE 1.29 06/11/2023 0944   CREATININE 1.78 (H) 09/11/2022 1602      Component Value Date/Time   CALCIUM 9.5 06/11/2023 0944   ALKPHOS 98 06/11/2023 0944   AST 22 06/11/2023 0944   ALT 34 06/11/2023 0944   BILITOT 0.8 06/11/2023 0944   BILITOT 0.3 09/21/2017 1156       The ASCVD Risk score (Arnett DK, et al., 2019) failed to calculate for the following reasons:   The patient has a prior MI or stroke diagnosis  Assessment and Plan:   1. Diabetes, type 2: uncontrolled per last A1c of 7.7% (06/11/23), increased from previous 7.2% (03/01/23) with goal <7% without hypoglycemia.   {lzcgmsmbg:31042} data shows ***?. Current Regimen: *** Diet: *** Exercise: ***  HCM: {LZ HCM DM2:31604} Continue medications today without changes.  ***  Reviewed s/sx/tx hypoglycemia Next A1c due *** Future Consideration: GLP1-RA: *** SGLT2i: *** Metformin: {LZ AP metformin:31605}  SU: Not unreasonable, though defer given alternative options with lower risk of hypoglycemia.  TZD: Avoiding due to possible weight gain/increase in fracture risk. ***HF Insulin: Not unreasonable, though defer as last resort or as needed for severe hyperglycemia given risk hypoglycemia. Other safer options at this time.    Called and  spoke to patient's new pharmacy, [facility]. They were attempting  to bill a box of insulin (5 pens) which is >30 day supply and therefore was costing over the $35 Medicare price. Unfortunately, the pharmacy states that they will not open boxes of insulin (must dispense in quantities of 5 pens). Because of this, patient would have to fill his insulin at a pharmacy that will open insulin boxes. Rx refilled at [facility]. Refill sent for 28 day supply per insurance requirement.  Continue current medications, no changes.  Refill: Tresiba 30 units daily, increase 1 units daily until fasting blood sugar is < 150 mg/dL. Max 42 units per day. (4 pens/12 mL for 28 day supply)   Follow Up Follow up with clinical pharmacist via *** in *** weeks Patient given direct line for questions regarding medication therapy and MAP applications***  Future Appointments  Date Time Provider Department Center  08/12/2023  1:30 PM [facility] PHARMACIST [facility]  09/09/2023 10:50 AM [facility] ANNUAL WELLNESS VISIT 1 [facility]  09/15/2023  2:30 PM Pyrtle, Carie Caddy, MD [facility]  09/22/2023 10:00 AM Otho Ket, RN [facility] None   [name], PharmD Clinical Pharmacist [facility] [phone]

## 2023-08-13 ENCOUNTER — Other Ambulatory Visit: Payer: Medicare HMO | Admitting: Pharmacist

## 2023-08-13 DIAGNOSIS — E1149 Type 2 diabetes mellitus with other diabetic neurological complication: Secondary | ICD-10-CM

## 2023-08-13 NOTE — Patient Instructions (Signed)
[name],   It was a pleasure to speak with you today! As we discussed:?   Continue Tresiba 50 units once daily.  A refill of this medication with the updated dosing instructions to bill a 30 day supply [facility].  Continue glimepiride We will call Novo to see if they are able to supply Ozempic before January We will also re-enroll you in the ovo program for 2025 to resume shipments in January 2025.   You were previously on metformin which we were unsure if this worsened your GI symptoms.  To be cautious, we discussed trying the extended-release form of metformin which is usually reserved for patients who have had GI upset on the regular metformin. Insurance will usually cover this extended release form once you have tried the normal metformin.  The extended release version is designed to have no stomach/GI side effects. Side effects with metformin are rare with the extended release form, but do resolve after ~2 weeks of consistent use.   We've prescribed a low dose - one tablet twice daily with meals to start. However, you may start with one tablet daily for a few days and increase to twice daily if no side effect concerns.   Please reach out prior to your next scheduled appointment should you have any questions or concerns.  Thank you!   Future Appointments  Date Time Provider Department Center  08/13/2023 11:00 AM [name] [facility]  09/09/2023 10:50 AM [facility] ANNUAL WELLNESS VISIT 1 [facility]  09/15/2023  2:30 PM Pyrtle, Carie Caddy, MD [facility]  09/22/2023 10:00 AM Otho Ket, RN [facility] None    [name], PharmD Clinical Pharmacist [facility] [phone]

## 2023-08-15 MED ORDER — METFORMIN HCL ER 500 MG PO TB24: 500.0000 mg | ORAL_TABLET | Freq: Two times a day (BID) | ORAL | 1 refills | Status: DC

## 2023-08-15 MED ORDER — TRESIBA FLEXTOUCH 100 UNIT/ML ~~LOC~~ SOPN: PEN_INJECTOR | SUBCUTANEOUS | 3 refills | Status: DC

## 2023-08-16 ENCOUNTER — Telehealth: Payer: Self-pay | Admitting: Family Medicine

## 2023-08-16 NOTE — Telephone Encounter (Signed)
Left voicemail for patient to return call to office. 

## 2023-08-16 NOTE — Telephone Encounter (Signed)
Please try to contact patient.  Agree with ER eval.  Thanks.

## 2023-08-16 NOTE — Telephone Encounter (Signed)
FYI: This call has been transferred to Access Nurse. Once the result note has been entered staff can address the message at that time.  Patient called in with the following symptoms:  Red Word: Loss of vision in one eye (left), pt says he experienced this over thanksgiving. Vision would go black in one eye and stay that way for 2-3 mins. When vision returned it would be blurry, patient says this happened again over the weekend and is starting to get blurry again.      Please advise at Mobile [phone] (mobile)  Message is routed to Provider Pool and [facility] Triage

## 2023-08-16 NOTE — Telephone Encounter (Signed)
Pt hung up while on hold with Access Nurse.  Attempted to call pt back to encourage him to go to ED due to vision changes, if continues to refuse make an appointment with provider in this office ASAP.  No answer.  LMTCB.

## 2023-08-17 DIAGNOSIS — Z79891 Long term (current) use of opiate analgesic: Secondary | ICD-10-CM | POA: Diagnosis not present

## 2023-08-17 DIAGNOSIS — S46009S Unspecified injury of muscle(s) and tendon(s) of the rotator cuff of unspecified shoulder, sequela: Secondary | ICD-10-CM | POA: Diagnosis not present

## 2023-08-17 DIAGNOSIS — M47812 Spondylosis without myelopathy or radiculopathy, cervical region: Secondary | ICD-10-CM | POA: Diagnosis not present

## 2023-08-17 DIAGNOSIS — M25551 Pain in right hip: Secondary | ICD-10-CM | POA: Diagnosis not present

## 2023-08-17 DIAGNOSIS — M545 Low back pain, unspecified: Secondary | ICD-10-CM | POA: Diagnosis not present

## 2023-08-17 DIAGNOSIS — M25511 Pain in right shoulder: Secondary | ICD-10-CM | POA: Diagnosis not present

## 2023-08-17 NOTE — Telephone Encounter (Signed)
Left voicemail for patient to return call to office. 

## 2023-08-18 NOTE — Telephone Encounter (Signed)
Left voicemail for patient to return call to office. 

## 2023-08-18 NOTE — Telephone Encounter (Signed)
 Several attempts made to contact patient with no success

## 2023-08-18 NOTE — Telephone Encounter (Signed)
Thank you for trying to contact patient.

## 2023-08-21 ENCOUNTER — Ambulatory Visit (INDEPENDENT_AMBULATORY_CARE_PROVIDER_SITE_OTHER): Payer: Medicare HMO | Admitting: Podiatry

## 2023-08-21 ENCOUNTER — Ambulatory Visit (INDEPENDENT_AMBULATORY_CARE_PROVIDER_SITE_OTHER): Payer: Medicare HMO

## 2023-08-21 DIAGNOSIS — M2042 Other hammer toe(s) (acquired), left foot: Secondary | ICD-10-CM | POA: Diagnosis not present

## 2023-08-21 DIAGNOSIS — M778 Other enthesopathies, not elsewhere classified: Secondary | ICD-10-CM | POA: Diagnosis not present

## 2023-08-21 NOTE — Progress Notes (Unsigned)
Subjective:   Patient ID: Tony Manning, male   DOB: 62 y.o.   MRN: [id number]   HPI Chief Complaint  Patient presents with   Foot Pain    RM#11 Left foot pain not getting better last several weeks has gotten worse.    62 year old male presents the office with above concerns.  We do not do injection last week as he was getting ready to have surgery but he ended up canceling the surgery.  Still gets pain in the ball of his foot.  No recent injuries or changes otherwise.  Review of Systems  All other systems reviewed and are negative.      Objective:  Physical Exam  General: AAO x3, NAD  Dermatological: Skin is warm, dry and supple bilateral.  There are no open sores, no preulcerative lesions, no rash or signs of infection present.  Mild erythema on the dorsal PIPJ from irritation from the hammertoes there is no skin breakdown or warmth.  Vascular: Dorsalis Pedis artery and Posterior Tibial artery pedal pulses are 2/4 bilateral with immedate capillary fill time.  There is no pain with calf compression, swelling, warmth, erythema.   Neruologic: Grossly intact via light touch bilateral.   Musculoskeletal: Tenderness palpation submetatarsal area but mostly it today along the second interspace.  There is localized edema there is no erythema.  There is no area pinpoint tenderness identified today.  MMT 5/5.  Hemorrhage is noted.       Assessment:   Metatarsalgia, plantar fasciitis, hammertoe     Plan:  -Treatment options discussed including all alternatives, risks, and complications -Etiology of symptoms were discussed -X-rays were obtained and reviewed with the patient.  3 views of the foot were obtained.  No evidence of acute fracture.  Hammertoes are noted.  Calcaneal spur present.  No changes. -Steroid injection performed today.  Which has been going on second interspace on the area of maximal tenderness.  Clean skin with alcohol.  Mixture of 1 cc of 0.5 cc Marcaine plain,  0.5 cc of lidocaine plain was infiltrated into the second interspace without complications.  Postinjection care discussed.  Tolerated well. -Metatarsal pads for support -We discussed the hammertoes discussed she is to avoid excess pressure.  Discussed surgical invention in the future if needed however he is in continue with conservative treatment for now.  Vivi Barrack DPM       2nd is injection

## 2023-08-25 ENCOUNTER — Encounter: Payer: Self-pay | Admitting: Pharmacist

## 2023-08-25 ENCOUNTER — Other Ambulatory Visit: Payer: Medicare HMO

## 2023-08-25 NOTE — Progress Notes (Deleted)
08/25/2023 Name: Tony Manning MRN: [id number] DOB: August 12, 1961  Subjective  No chief complaint on file.   Reason for visit: ?  Tony Manning is a 62 y.o. male with a history of diabetes (type 2), who presents today for an initial diabetes pharmacotherapy visit.? Pertinent PMH also includes HTN, HLD, NSTEMI, angina, orthostasis, vertigo, renal stenosis, hx nephrolithiasis.  Known DM Complications:  neuropathy , diabetic retinopathy   Care Team: Primary Care Provider: Joaquim Nam, MD   Date of Last Diabetes Related Visit: with PCP on 07/29/23 and with Pharmacist on 12/6   Recent Summary of Change: 12/6: Restart metformin XR 500 mg BID. Novo renewal started 2025 11/21: Off metformin and ozempic (c/f diarrhea) but still having variable diarrhea. D/w pt about restarting Ozempic, refer to pharmacy. No med changes.   Medication Access/Adherence: Prescription drug coverage: Payor: AETNA MEDICARE / Plan: AETNA MEDICARE HMO/PPO / Product Type: *No Product type* / .  - Reports that all medications are affordable. However, [facility] again charged >$100 for insulin refill (we've established this is a day-supply issue)  - Current Patient Assistance: NovoNordisk (Ozempic) - Medication Adherence: Patient denies missing doses of their medication.    Since Last visit / History of Present Illness: ?  Patient reports no concerns with current regimen. Feels that his diarrhea is likely unrelated to Ozempic/metformin given still having intermittent diarrhea after being off of both medications for quite some time.   Reported DM Regimen: ?  glimepiride 2 mg daily with breakfast Tresiba 50 units daily   DM medications tried in the past:?  Ozempic (c/f worsening baseline diarrhea) Metofrmin IR (c/f worsening baseline diarrhea)   SMBG  Libre 3 : Connected to Clinic ?   CGM Report (Date Range 9/22 - 12/5) ABG 192 mg/dL; GMI 7.8%; Variability 25.4%; TIR 39%, high 50%, very high 11%, low  0%, very low 0%  Hypo/Hyperglycemia: ?  Symptoms of hypoglycemia since last visit:? no  If yes, it was treated by: n/a  Symptoms of hyperglycemia since last visit:? no   DM Prevention:  Statin: Taking; high intensity.?  History of chronic kidney disease? no History of albuminuria? no, last UACR on 09/11/22 = 8 mg/g ACE/ARB - Taking losartan 50mg ; Urine MA/CR Ratio - normal, <30 Last eye exam: 05/15/22; Retinopathy Present Last foot exam: 07/29/2023 Tobacco Use: Never smoker Immunizations:? 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 no  History of hyperlipidemia? yes Current BMI: 30.1 kg/m2 (Ht 70 in, Wt 95.4 kg) Taking statin? yes; high intensity (atorvastatin 40) Taking aspirin? indicated (secondary prevention); Taking   Taking SGLT-2i? no Taking GLP- 1 RA? no     _______________________________________________  Objective    Review of Systems:? Limited in the setting of virtual visit  Constitutional:? No fever, chills or unintentional weight loss  Cardiovascular:? No chest pain or pressure, shortness of breath, dyspnea on exertion, orthopnea or LE edema  Pulmonary:? No cough or shortness of breath  GI:? diarrhea (intermittent chronic, improving), No nausea, vomiting, constipation, abdominal pain, dyspepsia, change in bowel habits  Endocrine:? No polyuria, polyphagia or blurred vision    Physical Examination:  Vitals:  Wt Readings from Last 3 Encounters:  07/29/23 210 lb 6.4 oz (95.4 kg)  06/29/23 203 lb (92.1 kg)  06/29/23 203 lb  6.4 oz (92.3 kg)   BP Readings from Last 3 Encounters:  07/29/23 124/68  06/29/23 110/62  06/29/23 110/62   Pulse Readings from Last 3 Encounters:  07/29/23 70  06/29/23 64  06/29/23 64     Labs:?  Lab  Results  Component Value Date   HGBA1C 7.7 (H) 06/11/2023   HGBA1C 7.2 (H) 03/01/2023   HGBA1C 8.3 (H) 09/11/2022   GLUCOSE 280 (H) 06/11/2023   MICRALBCREAT 8 09/11/2022   MICRALBCREAT 0.8 08/15/2012   CREATININE 1.29 06/11/2023   CREATININE 1.49 (H) 03/01/2023   CREATININE 1.78 (H) 09/11/2022   GFR 59.11 (L) 06/11/2023   GFR 47.53 (L) 01/09/2022   GFR 60.28 12/23/2021    Lab Results  Component Value Date   CHOL 103 09/11/2022   LDLCALC 44 09/11/2022   LDLCALC 57 06/09/2021   LDLCALC 59 03/05/2020   HDL 38 (L) 09/11/2022   TRIG 127 09/11/2022   TRIG 73.0 06/09/2021   TRIG 89.0 03/05/2020   ALT 34 06/11/2023   ALT 22 09/11/2022   AST 22 06/11/2023   AST 22 09/11/2022      Chemistry      Component Value Date/Time   NA 138 06/11/2023 0944   NA 138 09/21/2017 1156   K 4.8 06/11/2023 0944   CL 105 06/11/2023 0944   CO2 27 06/11/2023 0944   BUN 19 06/11/2023 0944   BUN 17 09/21/2017 1156   CREATININE 1.29 06/11/2023 0944   CREATININE 1.78 (H) 09/11/2022 1602      Component Value Date/Time   CALCIUM 9.5 06/11/2023 0944   ALKPHOS 98 06/11/2023 0944   AST 22 06/11/2023 0944   ALT 34 06/11/2023 0944   BILITOT 0.8 06/11/2023 0944   BILITOT 0.3 09/21/2017 1156       The ASCVD Risk score (Arnett DK, et al., 2019) failed to calculate for the following reasons:   Risk score cannot be calculated because patient has a medical history suggesting prior/existing ASCVD  Assessment and Plan:   Diabetes, type 2: uncontrolled per last A1c of 7.7% (06/11/23), increased from previous 7.2% (03/01/23) with goal <7% without hypoglycemia. Worsened glycemic control 2/2 stopping metformin and Ozempic d/t c/f diarrhea. Despite several weeks off of both, no change in diarrhea - pt feels likely unrelated to medication. Agreeable to resuming low-dose Ozempic though order deadline for Novo has passed August 22, 2023). In the meantime, we discussed metformin re-trial given no change in GI sx with  stopping. To be more cautious, we discussed switching to XR formulation given low incidence of adverse effects.  CGM data shows ABG 192 mg/dL; GMI 8.4%; TIR 69%, high 50%, very high 11%, low 0% Current Regimen: glimepiride 2 mg qAM, Tresiba 50 units qd Restart metformin Extended Release 500 mg twice daily with food  (discussed starting 1 tab daily with increase to twice daily after several days if no c/f side effects) Reviewed s/sx/tx hypoglycemia January: Consider re-start Ozempic 0.25 mg weekly  Medication Access: Similar issue to last month at [employer]. Billing >30 day supply and therefore insulin copay >$35 Medicare price. [facility] - Rx likely not updated with newest instructions. [facility] day-supply.  Refill: Tresiba 50 units daily Novo Nordisk MAP renewal application started for 2025 (patient pages completed online with pt today. Provider pages placed in PCP box) Will collaborate with CPhT Med Access team for future medication assistance renewal/correspondences. No further action needed at this time.   Healthcare Maintenance:  Pneumococcal -  Current status: DUE for PCV20. Last received PPSV23 >1 year ago. Age <65 with risk factor: Diabetes  Shingles - Current status: Up to date Influenza - Current status: Up to date  Due to receive the following vaccines: Tdap (last >10 years ago) and PCV20  Follow Up Follow up with clinical pharmacist via phone in 2-3 weeks for metformin tolerability/titration Patient given direct line for questions regarding medication therapy  Future Appointments  Date Time Provider Department Center  08/25/2023  1:30 PM [facility] CCM PHARMACIST [facility]  09/09/2023 10:50 AM [facility] ANNUAL WELLNESS VISIT 1 [facility]  09/15/2023  2:30 PM Pyrtle, Carie Caddy, MD [facility]  09/22/2023 10:00 AM Otho Ket, RN [facility] None   [name], PharmD Clinical Pharmacist [facility] [phone]

## 2023-08-25 NOTE — Progress Notes (Signed)
Attempted to contact patient for scheduled appointment for medication management. Left HIPAA compliant message for patient to return my call at their convenience. MyChart Message sent.

## 2023-08-28 ENCOUNTER — Other Ambulatory Visit: Payer: Self-pay | Admitting: Family Medicine

## 2023-09-10 ENCOUNTER — Ambulatory Visit (INDEPENDENT_AMBULATORY_CARE_PROVIDER_SITE_OTHER): Payer: Medicare HMO | Admitting: Family Medicine

## 2023-09-10 ENCOUNTER — Encounter: Payer: Self-pay | Admitting: Family Medicine

## 2023-09-10 ENCOUNTER — Encounter: Payer: Self-pay | Admitting: Pharmacist

## 2023-09-10 ENCOUNTER — Telehealth: Payer: Self-pay | Admitting: Family Medicine

## 2023-09-10 DIAGNOSIS — E1149 Type 2 diabetes mellitus with other diabetic neurological complication: Secondary | ICD-10-CM | POA: Diagnosis not present

## 2023-09-10 DIAGNOSIS — Z794 Long term (current) use of insulin: Secondary | ICD-10-CM

## 2023-09-10 MED ORDER — METFORMIN HCL ER 500 MG PO TB24
ORAL_TABLET | ORAL | 2 refills | Status: DC
Start: 2023-09-10 — End: 2023-11-03

## 2023-09-10 NOTE — Progress Notes (Signed)
 Manufacturer Assistance Program (MAP) Application   Manufacturer: Novo Nordisk    (Re-enrollment) Medication(s): Ozempic    Patient Portion of Application:  09/10/23:  Online enrollment unsuccessful due to:  According to the information we just verified, this is a type of insurance that would disqualify the patient from this program 09/10/23: Patient pages completed to be signed manually Income Documentation: N/A - Electronic verification elected.  Provider Portion of Application:  09/10/23: Provider portion completed by PharmD and faxed to clinic for review and signature. Prescription(s): Included in MAP application. 4 boxes required: 0.25 mg, 0.5 mg x2, 1.0 mg (previously on 1 mg though has been off. Re-start titration)   Application Status: Not submitted (pending signatures)  Next Steps: []    Patient signature  []    PCP signature []    Upon PCP signature(s) Application to be placed in front office patient folder for patient signature. Folder B for last name Florio []    Document Fax confirmation of approval once received  Note routed to PCP Clinic Pool to ensure PCP signature is obtained and application is faxed. Forwarded to [facility] CPhT Patient Advocate Team for future correspondences/re-enrollment.   *[facility] clinic team - Please Addend/update this note as the Next Steps are completed in office*

## 2023-09-10 NOTE — Telephone Encounter (Signed)
 Patient stopped by and stated that you where trying to contact him in regards to scheduling a visit with you. He stated that you can reach out to him whenever available.

## 2023-09-10 NOTE — Patient Instructions (Signed)
 Increase metformin if tolerated (cut back if not tolerated) and check with pharmacy staff here about restarting ozempic.  Take care.  Glad to see you. Let me know about your sugar and situation in about 2-3 weeks, sooner if needed.

## 2023-09-10 NOTE — Progress Notes (Signed)
 He has been off ozempic  in the meantime. D/w pt about checking with pharmacy staff about restart.   He is taking XR metformin  and tolerating that. Taking 50 units insulin  daily.  Sugar averaging ~252 for the last 14 days.  Average 240 for the last 7 days.  Rare lower readings in the afternoon, corrected with a snack.    He is on abx per dental clinic.    He couldn't afford ozmepic at the pharmacy.  He had patient assistance last year.    No diarrhea for the last month.  He attributed it to diet/corn.  He cut that out and diarrhea resolved.   Meds, vitals, and allergies reviewed.   ROS: Per HPI unless specifically indicated in ROS section   GEN: nad, alert and oriented HEENT: ncat NECK: supple w/o LA CV: rrr. PULM: ctab, no inc wob ABD: soft, +bs EXT: no edema SKIN: Well-perfused

## 2023-09-12 NOTE — Assessment & Plan Note (Signed)
 Discussed options.  He can increase metformin if tolerated (cut back if not tolerated) and check with pharmacy staff here about restarting ozempic.  He can let me know about his sugar and situation in about 2-3 weeks, sooner if needed.

## 2023-09-13 ENCOUNTER — Other Ambulatory Visit (INDEPENDENT_AMBULATORY_CARE_PROVIDER_SITE_OTHER): Payer: Medicare HMO | Admitting: Pharmacist

## 2023-09-13 DIAGNOSIS — Z794 Long term (current) use of insulin: Secondary | ICD-10-CM

## 2023-09-13 DIAGNOSIS — E1149 Type 2 diabetes mellitus with other diabetic neurological complication: Secondary | ICD-10-CM

## 2023-09-13 NOTE — Progress Notes (Signed)
 09/13/2023 Name: Tony Manning MRN: [id number] DOB: 12-03-1960  Subjective  Chief Complaint  Patient presents with   Diabetes    Reason for visit: ?  Tony Manning is a 63 y.o. male with a history of diabetes (type 2), who presents today for a follow up diabetes pharmacotherapy visit.? Pertinent PMH also includes HTN, HLD, NSTEMI, angina, orthostasis, vertigo, renal stenosis, CKD, hx nephrolithiasis.  Known DM Complications:  neuropathy , diabetic retinopathy   Care Team: Primary Care Provider: Cleatus Arlyss RAMAN, MD   Date of Last Diabetes Related Visit: with PCP on 09/10/23 and with Pharmacist on 12/6   Recent Summary of Change: 09/10/23: Increase metformin  XR ? 1000/500 mg am/pm 12/6: Restart metformin  XR 500 mg BID. Novo renewal started 2025 11/21: Off metformin  and ozempic  (c/f diarrhea) but still having variable diarrhea. D/w pt about restarting Ozempic , refer to pharmacy. No med changes.   Medication Access/Adherence: Prescription drug coverage: Payor: AETNA MEDICARE / Plan: AETNA MEDICARE HMO/PPO / Product Type: *No Product type* / .  - Reports that all medications are affordable. - Current Patient Assistance: NovoNordisk (Renewal in progress) - Medication Adherence: Patient denies missing doses of their medication.    Since Last visit / History of Present Illness: ?  Patient reports no concerns with current regimen. Reports no further stomach issues (feels this was related to his diet rather than his medications). Was enrolled in Novo last year for Ozempic .   Reported DM Regimen: ?  glimepiride  2 mg daily with breakfast Tresiba  50 units daily Metformin  XR 1000 mg AM, 500 mg PM   DM medications tried in the past:?  Ozempic  (c/f worsening baseline diarrhea) Metofrmin IR (c/f worsening baseline diarrhea)  Meals: Typically once meal per day on average. Breakfast: Skips Lunch: Skips Dinner: Primary/largest meal of the day   SMBG  Libre 3 : Connected to Clinic  ?  CGM Report (Date Range 12/24 - 1/6) ABG 244 mg/dL; GMI 0.8%; Variability 32.2%; TIR 21%, high 33%, very high 46%, low 0%, very low 0%    Hypo/Hyperglycemia: ?  Symptoms of hypoglycemia since last visit:? no  If yes, it was treated by: n/a  Symptoms of hyperglycemia since last visit:? no   DM Prevention:  Statin: Taking; high intensity.?  History of chronic kidney disease? no History of albuminuria? no, last UACR on 09/11/22 = 8 mg/g ACE/ARB - Taking losartan  50mg ; Urine MA/CR Ratio - normal, <30 Last eye exam: 05/15/22; Retinopathy Present Last foot exam: 07/29/2023 Tobacco Use: Never smoker Immunizations:? 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 no  History of hyperlipidemia? yes Current BMI: 30.56 kg/m2 (Ht 70 in, Wt 96.6 kg) Taking statin? yes; high intensity (atorvastatin  40) Taking aspirin ? indicated (secondary prevention); Taking   Taking SGLT-2i? no Taking GLP- 1 RA? no     _______________________________________________  Objective    Review of Systems:? Limited in the setting of virtual visit  Constitutional:? No fever, chills or unintentional weight loss  Cardiovascular:? No chest pain or pressure, shortness of breath, dyspnea on exertion, orthopnea or LE edema  Pulmonary:? No cough or shortness of breath  GI:? No nausea, vomiting, constipation, diarrhea, abdominal pain, dyspepsia, change in bowel habits  Endocrine:? No polyuria, polyphagia or blurred vision    Physical Examination:  Vitals:  Wt Readings from Last 3 Encounters:  09/10/23 213 lb (96.6 kg)  07/29/23 210 lb 6.4 oz (95.4 kg)  06/29/23 203 lb (92.1 kg)   BP Readings from Last 3 Encounters:  09/10/23 (!) 142/72  07/29/23 124/68  06/29/23 110/62   Pulse  Readings from Last 3 Encounters:  09/10/23 (!) 58  07/29/23 70  06/29/23 64     Labs:?  Lab Results  Component Value Date   HGBA1C 7.7 (H) 06/11/2023   HGBA1C 7.2 (H) 03/01/2023   HGBA1C 8.3 (H) 09/11/2022   GLUCOSE 280 (H) 06/11/2023   MICRALBCREAT 8 09/11/2022   MICRALBCREAT 0.8 08/15/2012   CREATININE 1.29 06/11/2023   CREATININE 1.49 (H) 03/01/2023   CREATININE 1.78 (H) 09/11/2022   GFR 59.11 (L) 06/11/2023   GFR 47.53 (L) 01/09/2022   GFR 60.28 12/23/2021    Lab Results  Component Value Date   CHOL 103 09/11/2022   LDLCALC 44 09/11/2022   LDLCALC 57 06/09/2021   LDLCALC 59 03/05/2020   HDL 38 (L) 09/11/2022   TRIG 127 09/11/2022   TRIG 73.0 06/09/2021   TRIG 89.0 03/05/2020   ALT 34 06/11/2023   ALT 22 09/11/2022   AST 22 06/11/2023   AST 22 09/11/2022      Chemistry      Component Value Date/Time   NA 138 06/11/2023 0944   NA 138 09/21/2017 1156   K 4.8 06/11/2023 0944   CL 105 06/11/2023 0944   CO2 27 06/11/2023 0944   BUN 19 06/11/2023 0944   BUN 17 09/21/2017 1156   CREATININE 1.29 06/11/2023 0944   CREATININE 1.78 (H) 09/11/2022 1602      Component Value Date/Time   CALCIUM  9.5 06/11/2023 0944   ALKPHOS 98 06/11/2023 0944   AST 22 06/11/2023 0944   ALT 34 06/11/2023 0944   BILITOT 0.8 06/11/2023 0944   BILITOT 0.3 09/21/2017 1156       The ASCVD Risk score (Arnett DK, et al., 2019) failed to calculate for the following reasons:   Risk score cannot be calculated because patient has a medical history suggesting prior/existing ASCVD  Assessment and Plan:   Diabetes, type 2: uncontrolled per last A1c of 7.7% (06/11/23), increased from previous 7.2% (03/01/23) with goal <7% without hypoglycemia. Worsened glycemic control 2/2 stopping metformin  and Ozempic  d/t c/f diarrhea previously, though patient now feels it was unrelated.  Since restarting metformin  XR, has had no c/f GI effects. Increased evening PPBG/FBG. BG regularly falls to low 70s or 80s  in the afternoons (from 200-300). Possibly secondary to using glimepiride  in the mornings without eating breakfast/lunch most days.  Basal titration at this time is limited by this daily drop. Will move glimepiride  dose to dinner to combat significant PP hyperglycemia and hopefully reduce afternoon BG drops allowing for basal titration. Ozempic  PAP completed, pending Md and pt signatures. Ideally Ozempic  titration will allow for d/c of glimepiride .  CGM data shows ABG 244 mg/dL; GMI 0.8%; TIR 78%, high 33%, very high 46%, low 0% Current Regimen: glimepiride  2 mg qAM, Tresiba  50 units every day, metformin  XR 1000mg /500mg  am/pm Change glimepiride  to prior to dinner instead of breakfast (skips breakfast/lunch resulting in near-lows) Reviewed s/sx/tx hypoglycemia Future consideration: Maximize metformin  ?1000 mg BID or 2000 mg daily (XR) Ozempic  requested form Novo (0.25 mg pen, two 0.5 mg pen, one 1.0 mg pen) D/c glimepiride  w Ozempic  titration (ideally)   Healthcare Maintenance:  Pneumococcal - Current status: DUE for PCV20. Last received PPSV23 >1 year  ago. Age <65 with risk factor: Diabetes  Shingles - Current status: Up to date Influenza - Current status: Up to date  Due to receive the following vaccines: Tdap (last >10 years ago) and PCV20  Follow Up Follow up with clinical pharmacist via phone in 4-6 weeks for titration Patient given direct line for questions regarding medication therapy  Future Appointments  Date Time Provider Department Center  09/22/2023 10:00 AM Green, Davina E, RN [facility] None  10/27/2023  1:00 PM [facility] CCM PHARMACIST [facility]   Manuelita FABIENE Kobs, PharmD Clinical Pharmacist [facility] [phone]

## 2023-09-13 NOTE — Patient Instructions (Addendum)
 [name],   It was a pleasure to speak with you today! As we discussed:?   Continue Tresiba  50 units once daily.  Continue glimepiride  though take prior to dinner since you have been skipping breakfast/lunch most days. Hopefully once back on Ozempic , we may be able to discontinue this medication.  Continue metformin  XR 1000 mg (2 tablets) with breakfast, 500 mg (1 tablet) with dinner  Your portion of the Novo Nordisk application for Ozempic  has been sent to the front desk here at clinic. You may stop by at any point and let the front desk know you have a paper to sign.   Please reach out prior to your next scheduled appointment should you have any questions or concerns.  Thank you!   Future Appointments  Date Time Provider Department Center  09/22/2023 10:00 AM Green, Davina E, RN [facility] None  10/27/2023  1:00 PM [facility] [facility]CM PHARMACIST [facility]    Tony Manning, PharmD Clinical Pharmacist [facility] [phone]

## 2023-09-14 NOTE — Progress Notes (Signed)
 Placed in tray

## 2023-09-14 NOTE — Progress Notes (Signed)
 Paperwork placed in Dr. Armanda Heritage tray

## 2023-09-15 ENCOUNTER — Encounter: Payer: Medicare HMO | Admitting: Internal Medicine

## 2023-09-16 NOTE — Progress Notes (Signed)
 I'll work on the hard copy.  Thanks.

## 2023-09-20 NOTE — Progress Notes (Signed)
 Spoke with patient and advised that the documentation is upfront for him to sign

## 2023-09-22 ENCOUNTER — Ambulatory Visit: Payer: Self-pay

## 2023-09-22 NOTE — Patient Outreach (Signed)
  Care Coordination   09/22/2023 Name: Tony Manning MRN: [id number] DOB: 1961-06-11   Care Coordination Outreach Attempts:  An unsuccessful outreach was attempted for an appointment today. HIPAA compliant message left with return call phone number.  Follow Up Plan:  Additional outreach attempts will be made to offer the patient complex care management information and services.   Encounter Outcome:  No Answer   Care Coordination Interventions:  No, not indicated    Verba Girt RN,BSN,CCM [facility]  [facility], [facility] coordinator / Case Manager Phone: [phone]

## 2023-09-24 ENCOUNTER — Telehealth: Payer: Self-pay | Admitting: Family Medicine

## 2023-09-24 NOTE — Telephone Encounter (Signed)
Patient dropped off document Patient Assistance Application, to be filled out by provider. Patient requested to send it back via Call Patient to pick up within 5-days. Document is located in providers tray at front office.Please advise at Mobile [phone] (mobile)

## 2023-09-24 NOTE — Progress Notes (Signed)
Documentation received back from patient signed. Document was previously signed by provider. Faxed document on 09/24/23 and placed in pharmacist folder

## 2023-09-24 NOTE — Telephone Encounter (Signed)
Received

## 2023-09-30 ENCOUNTER — Telehealth: Payer: Self-pay | Admitting: *Deleted

## 2023-09-30 NOTE — Progress Notes (Signed)
Complex Care Management Care Guide Note  09/30/2023 Name: Tony Manning MRN: [id number] DOB: 06-28-1961  Tony Manning is a 63 y.o. year old male who is a primary care patient of Joaquim Nam, MD and is actively engaged with the care management team. I reached out to Agustina Caroli by phone today to assist with re-scheduling  with the RN Case Manager.  Follow up plan: Unsuccessful telephone outreach attempt made. A HIPAA compliant phone message was left for the patient providing contact information and requesting a return call.  Burman Nieves, CMA, Care Guide [facility]  [facility], [facility] Guide Direct Dial: [phone]  Fax: [phone] Website: [email or web address]

## 2023-09-30 NOTE — Progress Notes (Signed)
Complex Care Management Care Guide Note  09/30/2023 Name: [name] MRN: [id number] DOB: 08-23-1961  Tony Manning is a 63 y.o. year old male who is a primary care patient of Joaquim Nam, MD and is actively engaged with the care management team. I reached out to Agustina Caroli by phone today to assist with re-scheduling  with the RN Case Manager.  Follow up plan: Telephone appointment with complex care management team member scheduled for:  10/07/2023  Burman Nieves, CMA, Care Guide [facility], [facility] Guide Direct Dial: [phone]  Fax: [phone] Website: [email or web address]

## 2023-10-01 ENCOUNTER — Other Ambulatory Visit: Payer: Self-pay | Admitting: Family Medicine

## 2023-10-07 ENCOUNTER — Encounter: Payer: Self-pay | Admitting: Pharmacist

## 2023-10-07 ENCOUNTER — Ambulatory Visit: Payer: Self-pay

## 2023-10-07 ENCOUNTER — Telehealth: Payer: Self-pay

## 2023-10-07 NOTE — Progress Notes (Signed)
Yes it was just one of the red box of the Ozempic. New form placed in Dr. Para March tray

## 2023-10-07 NOTE — Progress Notes (Signed)
Patient called and left message regarding his Ozempic shipment. Received notice from clinic that one ozempic pen was  Per Novo automated system, patient is approved for Ozempic through 09/06/2024.  Unclear why only one pen would be shipped. Patient reports he was told  [employer]ovo needed more information on the prescription.   Dose Change/Refill request Form filled out for remainder of titration. Uploaded to PCP eFax folder. Cc'd clinical pool.

## 2023-10-07 NOTE — Progress Notes (Signed)
Form signed. Thanks!

## 2023-10-07 NOTE — Telephone Encounter (Signed)
Spoke with patient and advised that the his 1 pen of ozempic is available for pick up at the office

## 2023-10-08 NOTE — Patient Outreach (Signed)
  Care Coordination   Follow Up Visit Note   10/08/2023 Late entry for 10/07/23 Name: Tony Manning MRN: [id number] DOB: June 01, 1961  Tony Manning is a 63 y.o. year old male who sees Joaquim Nam, MD for primary care. I spoke with  Agustina Caroli by phone today.  What matters to the patients health and wellness today?  Patient states blood sugars are not good.  He stats his 7 days average was 221 with blood sugars ranging in the 200's.  Patient states his provider would like for him to start taking Ozempic.  He states he is is the process of trying to get medication assistance.  Patient states he usually eats 1 x per day.  He states examples of food choices are chicken or roast, rice, potatoes and muffins approximately 2 x per week. He states he doesn't eat salad and his vegetable choices are peas and/ or corn. Patient reports having low blood sugars < 70 at least once almost.  daily    Goals Addressed             This Visit's Progress    Management of health conditions       Interventions Today    Flowsheet Row Most Recent Value  Chronic Disease   Chronic disease during today's visit Diabetes, Hypertension (HTN)  General Interventions   General Interventions Discussed/Reviewed General Interventions Reviewed, Doctor Visits, Labs  [place] of current treatment plan for listed health conditions and patients adherence to plan as established by provider.  Assessed for blood sugar and blood pressure readings.]  Labs Hgb A1c every 6 months  [reviewed Hgb A1c goal.]  Doctor Visits Discussed/Reviewed Doctor Visits Reviewed  Annabell Sabal upcoming provider visits. Advised to keep follow up visit with providers as recommended.]  Exercise Interventions   Exercise Discussed/Reviewed Physical Activity  [Assessed current activity level.  Advised to increase activity level 3-4 times per week]  Education Interventions   Provided Verbal Education On Blood Sugar Monitoring  [Advised to  continue monitoring blood sugar daily.]  Nutrition Interventions   Nutrition Discussed/Reviewed Nutrition Reviewed, Carbohydrate meal planning  [Discussed eating lower carbohydrate diet. Discussed decreasing starchy vegetables such as peas, corn, beans, and potatoes.  Advised to eat non starchy vegetables. Advised to consider]  Pharmacy Interventions   Pharmacy Dicussed/Reviewed Pharmacy Topics Reviewed  [medications reviewed and compliance discussed. Advised to follow up with practice pharmacist regarding medicaton assistance for Ozempic.]                 SDOH assessments and interventions completed:  No     Care Coordination Interventions:  Yes, provided   Follow up plan: Follow up call scheduled for 11/05/23 at 3 pm    Encounter Outcome:  Patient Visit Completed   George Ina RN, BSN, CCM [place]  [facility], Population Health Case Manager Phone: [phone]

## 2023-10-08 NOTE — Progress Notes (Signed)
Form signed and faxed to [employer] on 10/08/2023. Copy placed in pharmacist folder

## 2023-10-08 NOTE — Patient Instructions (Signed)
Visit Information  Thank you for taking time to visit with me today. Please don't hesitate to contact me if I can be of assistance to you.   Following are the goals we discussed today:   Goals Addressed             This Visit's Progress    Management of health conditions       Interventions Today    Flowsheet Row Most Recent Value  Chronic Disease   Chronic disease during today's visit Diabetes, Hypertension (HTN)  General Interventions   General Interventions Discussed/Reviewed General Interventions Reviewed, Doctor Visits, Labs  [place] of current treatment plan for listed health conditions and patients adherence to plan as established by provider.  Assessed for blood sugar and blood pressure readings.]  Labs Hgb A1c every 6 months  [reviewed Hgb A1c goal.]  Doctor Visits Discussed/Reviewed Doctor Visits Reviewed  Annabell Sabal upcoming provider visits. Advised to keep follow up visit with providers as recommended.]  Exercise Interventions   Exercise Discussed/Reviewed Physical Activity  [Assessed current activity level.  Advised to increase activity level 3-4 times per week]  Education Interventions   Provided Verbal Education On Blood Sugar Monitoring  [Advised to continue monitoring blood sugar daily.]  Nutrition Interventions   Nutrition Discussed/Reviewed Nutrition Reviewed, Carbohydrate meal planning  [Discussed eating lower carbohydrate diet. Discussed decreasing starchy vegetables such as peas, corn, beans, and potatoes.  Advised to eat non starchy vegetables. Advised to consider]  Pharmacy Interventions   Pharmacy Dicussed/Reviewed Pharmacy Topics Reviewed  [medications reviewed and compliance discussed. Advised to follow up with practice pharmacist regarding medicaton assistance for Ozempic.]                 Our next appointment is by telephone on 11/05/23 at 3 pm  Please call the care guide team at [phone] if you need to cancel or reschedule your  appointment.   If you are experiencing a Mental Health or Behavioral Health Crisis or need someone to talk to, please call the Suicide and Crisis Lifeline: 988 call 1-800-273-TALK (toll free, 24 hour hotline)  Patient verbalizes understanding of instructions and care plan provided today and agrees to view in MyChart. Active MyChart status and patient understanding of how to access instructions and care plan via MyChart confirmed with patient.     George Ina RN, BSN, CCM [facility], Population Health Case Manager Phone: [phone]    '

## 2023-10-11 ENCOUNTER — Ambulatory Visit (INDEPENDENT_AMBULATORY_CARE_PROVIDER_SITE_OTHER): Payer: Medicare HMO | Admitting: Podiatry

## 2023-10-11 ENCOUNTER — Encounter: Payer: Self-pay | Admitting: Podiatry

## 2023-10-11 VITALS — Ht 70.0 in | Wt 213.0 lb

## 2023-10-11 DIAGNOSIS — M7752 Other enthesopathy of left foot: Secondary | ICD-10-CM | POA: Diagnosis not present

## 2023-10-11 NOTE — Progress Notes (Signed)
  Subjective:  Patient ID: Tony Manning, male    DOB: 1960-10-27,  MRN: [id number]  Chief Complaint  Patient presents with   Foot Pain    Pt is here due to left foot pain, pt states he has been seeing Dr Ardelle Anton for the issue and last he gave him an injection and brown pad to wear states he lost the pad and would like another one.    63 y.o. male presents with the above complaint. History confirmed with patient.   Objective:  Physical Exam: warm, good capillary refill, no trophic changes or ulcerative lesions, normal DP and PT pulses, normal sensory exam, and pes cavus foot type he has semirigid hammertoe deformities he has pain and tenderness in the plantar first interspace and with lateral compression of the metatarsal heads.   Assessment:   1. Bursitis of intermetatarsal bursa of left foot      Plan:  Patient was evaluated and treated and all questions answered.  Had about 50% improvement from last injection, recommended repeat injection today.  We discussed the impact of this on his blood sugar that he should monitor this in his diet closely.  We also discussed offloading with a metatarsal pad silicone sleeve which I gave to him again today, and he may order more of these online, we discussed also this could be integrated into a custom molded foot orthosis as a long-term solution.  Follow-up as needed, if pain returns or worsens within the next 2 months would recommend evaluating with an MRI before further injections  After sterile prep with alcohol, the left second interspace was injected with 0.5cc 2% xylocaine plain, 0.5cc 0.5% marcaine plain, 5mg  triamcinolone acetonide, and 2mg  dexamethasone was injected from a dorsal approach. The patient tolerated the procedure well without complication.   Return if symptoms worsen or fail to improve.

## 2023-10-14 ENCOUNTER — Other Ambulatory Visit: Payer: Self-pay | Admitting: Family Medicine

## 2023-10-14 ENCOUNTER — Telehealth: Payer: Self-pay | Admitting: Podiatry

## 2023-10-14 NOTE — Telephone Encounter (Signed)
 Patient stated injections is not working. Please contact Patient. [phone]

## 2023-10-20 ENCOUNTER — Encounter: Payer: Self-pay | Admitting: Pharmacist

## 2023-10-20 DIAGNOSIS — M545 Low back pain, unspecified: Secondary | ICD-10-CM | POA: Diagnosis not present

## 2023-10-20 DIAGNOSIS — M47812 Spondylosis without myelopathy or radiculopathy, cervical region: Secondary | ICD-10-CM | POA: Diagnosis not present

## 2023-10-20 DIAGNOSIS — Z79891 Long term (current) use of opiate analgesic: Secondary | ICD-10-CM | POA: Diagnosis not present

## 2023-10-20 DIAGNOSIS — S46009S Unspecified injury of muscle(s) and tendon(s) of the rotator cuff of unspecified shoulder, sequela: Secondary | ICD-10-CM | POA: Diagnosis not present

## 2023-10-20 DIAGNOSIS — M25511 Pain in right shoulder: Secondary | ICD-10-CM | POA: Diagnosis not present

## 2023-10-20 NOTE — Progress Notes (Signed)
Placed in your tray

## 2023-10-20 NOTE — Progress Notes (Signed)
I will work on [employer].  Thanks.

## 2023-10-20 NOTE — Progress Notes (Addendum)
Patient Assistance Program (PAP) Application   Manufacturer: [employer]    (Currently enrolled  -  dose change request) Medication(s): Ozempic -- approval states 0.25/0.5 mg despite 1 and 2 mg submitted   Provider Portion of Application:  10/20/23: Provider portion completed by Pharmacist and placed in PCP inbox for signature. Prescription(s):  Included in [employer] Dose Change Request Form Ozempic 1.0 mg x 2 boxes Ozempic 2.0 mg x2 boxes  Application Status: Submitted - pending approval of dose change  Next Steps: [x]    PCP review/signature [x]    Upon PCP signature Application to be faxed to [employer] Fax: [phone] AND scanned into patient chart - Faxed by [facility] 10/26/23.   *[facility] clinic team - Please Addend/update this note as the "Next Steps" are completed in office*

## 2023-10-22 ENCOUNTER — Telehealth: Payer: Self-pay | Admitting: Podiatry

## 2023-10-22 DIAGNOSIS — M7742 Metatarsalgia, left foot: Secondary | ICD-10-CM

## 2023-10-22 DIAGNOSIS — M7752 Other enthesopathy of left foot: Secondary | ICD-10-CM

## 2023-10-22 NOTE — Telephone Encounter (Signed)
Pt called he wanted Dr Lilian Kapur to know his foot is not better.

## 2023-10-25 ENCOUNTER — Telehealth: Payer: Self-pay | Admitting: Family Medicine

## 2023-10-25 NOTE — Telephone Encounter (Signed)
 Placed in your tray

## 2023-10-25 NOTE — Telephone Encounter (Signed)
 I'll work on the hard copy.  Thanks.

## 2023-10-25 NOTE — Telephone Encounter (Signed)
Patient dropped of Handicap placecard form to complete

## 2023-10-26 NOTE — Progress Notes (Signed)
Signed by provider and faxed to [facility] 10/26/23.

## 2023-10-27 ENCOUNTER — Other Ambulatory Visit: Payer: Medicare HMO

## 2023-10-27 NOTE — Progress Notes (Deleted)
 10/27/2023 Name: Tony Manning MRN: [id number] DOB: [birth year]  Subjective  No chief complaint on file.   Reason for visit: ?  Tony Manning is a 63 y.o. male with a history of diabetes (type 2), who presents today for a follow up diabetes pharmacotherapy visit.? Pertinent PMH also includes HTN, HLD, NSTEMI, angina, orthostasis, vertigo, renal stenosis, CKD, hx nephrolithiasis.  Known DM Complications:  neuropathy , diabetic retinopathy   Care Team: Primary Care Provider: Joaquim Nam, MD   Date of Last Diabetes Related Visit: with PCP on 09/10/23 and with Pharmacist on 12/6   Recent Summary of Change: 09/10/23: Increase metformin XR ? 1000/500 mg am/pm 12/6: Restart metformin XR 500 mg BID. [employer] renewal started 2025 11/21: Off metformin and ozempic (c/f diarrhea) but still having variable diarrhea. D/w pt about restarting Ozempic, refer to pharmacy. No med changes.   Medication Access/Adherence: Prescription drug coverage: Payor: [employer] MEDICARE / Plan: [employer] MEDICARE HMO/PPO / Product Type: *No Product type* / .  - Reports that all medications are affordable. - Current Patient Assistance: [employer] (Renewal in progress) - Medication Adherence: Patient denies missing doses of their medication.    Since Last visit / History of Present Illness: ?  Patient reports no concerns with current regimen. Reports no further stomach issues (feels this was related to his diet rather than his medications). Was enrolled in [employer] last year for Ozempic.   Reported DM Regimen: ?  glimepiride 2 mg daily with breakfast Tresiba 50 units daily Metformin XR 1000 mg AM, 500 mg PM   DM medications tried in the past:?  Ozempic (c/f worsening baseline diarrhea) Metofrmin IR (c/f worsening baseline diarrhea)  Meals: Typically once meal per day on average. Breakfast: Skips Lunch: Skips Dinner: Primary/largest meal of the day   SMBG  Libre 3 : Connected to Clinic ?  CGM Report (Date  Range 12/24 - 1/6) ABG 244 mg/dL; GMI 4.0%; Variability 32.2%; TIR 21%, high 33%, very high 46%, low 0%, very low 0%    Hypo/Hyperglycemia: ?  Symptoms of hypoglycemia since last visit:? no  If yes, it was treated by: n/a  Symptoms of hyperglycemia since last visit:? no   DM Prevention:  Statin: Taking; high intensity.?  History of chronic kidney disease? no History of albuminuria? no, last UACR on 09/11/22 = 8 mg/g ACE/ARB - Taking losartan 50mg ; Urine MA/CR Ratio - normal, <30 Last eye exam: 05/15/22; Retinopathy Present Last foot exam: 07/29/2023 Tobacco Use: Never smoker Immunizations:? 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 no  History of hyperlipidemia? yes Current BMI: 30.56 kg/m2 (Ht 70 in, Wt 96.6 kg) Taking statin? yes; high intensity (atorvastatin 40) Taking aspirin? indicated (secondary prevention); Taking   Taking SGLT-2i? no Taking GLP- 1 RA? no     _______________________________________________  Objective    Review of Systems:? Limited in the setting of virtual visit  Constitutional:? No fever, chills or unintentional weight loss  Cardiovascular:? No chest pain or pressure, shortness of breath, dyspnea on exertion, orthopnea or LE edema  Pulmonary:? No cough or shortness of breath  GI:? No nausea, vomiting, constipation, diarrhea, abdominal pain, dyspepsia, change in bowel habits  Endocrine:? No polyuria, polyphagia or blurred vision    Physical Examination:  Vitals:  Wt Readings from Last  3 Encounters:  10/11/23 213 lb (96.6 kg)  09/10/23 213 lb (96.6 kg)  07/29/23 210 lb 6.4 oz (95.4 kg)   BP Readings from Last 3 Encounters:  09/10/23 (!) 142/72  07/29/23 124/68  06/29/23 110/62   Pulse Readings from Last 3  Encounters:  09/10/23 (!) 58  07/29/23 70  06/29/23 64     Labs:?  Lab Results  Component Value Date   HGBA1C 7.7 (H) 06/11/2023   HGBA1C 7.2 (H) 03/01/2023   HGBA1C 8.3 (H) 09/11/2022   GLUCOSE 280 (H) 06/11/2023   MICRALBCREAT 8 09/11/2022   MICRALBCREAT 0.8 08/15/2012   CREATININE 1.29 06/11/2023   CREATININE 1.49 (H) 03/01/2023   CREATININE 1.78 (H) 09/11/2022   GFR 59.11 (L) 06/11/2023   GFR 47.53 (L) 01/09/2022   GFR 60.28 12/23/2021    Lab Results  Component Value Date   CHOL 103 09/11/2022   LDLCALC 44 09/11/2022   LDLCALC 57 06/09/2021   LDLCALC 59 03/05/2020   HDL 38 (L) 09/11/2022   TRIG 127 09/11/2022   TRIG 73.0 06/09/2021   TRIG 89.0 03/05/2020   ALT 34 06/11/2023   ALT 22 09/11/2022   AST 22 06/11/2023   AST 22 09/11/2022      Chemistry      Component Value Date/Time   NA 138 06/11/2023 0944   NA 138 09/21/2017 1156   K 4.8 06/11/2023 0944   CL 105 06/11/2023 0944   CO2 27 06/11/2023 0944   BUN 19 06/11/2023 0944   BUN 17 09/21/2017 1156   CREATININE 1.29 06/11/2023 0944   CREATININE 1.78 (H) 09/11/2022 1602      Component Value Date/Time   CALCIUM 9.5 06/11/2023 0944   ALKPHOS 98 06/11/2023 0944   AST 22 06/11/2023 0944   ALT 34 06/11/2023 0944   BILITOT 0.8 06/11/2023 0944   BILITOT 0.3 09/21/2017 1156       The ASCVD Risk score (Arnett DK, et al., 2019) failed to calculate for the following reasons:   Risk score cannot be calculated because patient has a medical history suggesting prior/existing ASCVD  Assessment and Plan:   Diabetes, type 2: uncontrolled per last A1c of 7.7% (06/11/23), increased from previous 7.2% (03/01/23) with goal <7% without hypoglycemia. Worsened glycemic control 2/2 stopping metformin and Ozempic d/t c/f diarrhea previously, though patient now feels it was unrelated.  Since restarting metformin XR, has had no c/f GI effects. Increased evening PPBG/FBG. BG regularly falls to low 70s or 80s in the afternoons  (from 200-300). Possibly secondary to using glimepiride in the mornings without eating breakfast/lunch most days.  Basal titration at this time is limited by this daily drop. Will move glimepiride dose to dinner to combat significant PP hyperglycemia and hopefully reduce afternoon BG drops allowing for basal titration. Ozempic PAP completed, pending Md and pt signatures. Ideally Ozempic titration will allow for d/c of glimepiride.  CGM data shows ABG 244 mg/dL; GMI 1.6%; TIR 10%, high 33%, very high 46%, low 0% Current Regimen: glimepiride 2 mg qAM, Tresiba 50 units every day, metformin XR 1000mg /500mg  am/pm Change glimepiride to prior to dinner instead of breakfast (skips breakfast/lunch resulting in near-lows) Reviewed s/sx/tx hypoglycemia Future consideration: Maximize metformin ?1000 mg BID or 2000 mg daily (XR) Ozempic requested form [employer] (0.25 mg pen, two 0.5 mg pen, one 1.0 mg pen) D/c glimepiride w Ozempic titration (ideally)   Healthcare Maintenance:  Pneumococcal - Current status: DUE for PCV20. Last received PPSV23 >1 year ago. Age <65 with  risk factor: Diabetes  Shingles - Current status: Up to date Influenza - Current status: Up to date  Due to receive the following vaccines: Tdap (last >10 years ago) and PCV20  Follow Up Follow up with clinical pharmacist via phone in 4-6 weeks for titration Patient given direct line for questions regarding medication therapy  Future Appointments  Date Time Provider Department Center  10/27/2023  1:00 PM [facility] CCM PHARMACIST [facility]  11/05/2023  3:00 PM Otho Ket, RN [facility] None  11/16/2023  9:15 AM Vivi Barrack, DPM [facility]   [name], PharmD Clinical Pharmacist [facility] [phone]

## 2023-10-27 NOTE — Progress Notes (Addendum)
 Called and spoke w [employer] 2/19 to inquire as to why automated system still showing 0.25 mg dose only.   On Hold for >20 minutes  Representative states that they have received the dose change requests but needed "clarification" on the forms though did not contact the office to request clarification.   Written for 2 boxes 1.0 mg for a total of 8 weeks, and 2 boxes of 2.0 mg ([employer] requires 4 boxes total).   Rep asks to confirm how this will be taken.... I confirmed 1.0 x8 weeks, then 2.0 mg. This was all they needed allegedly despite this being the exact instruction on his application.[name] Kitchen   Upon processing his form, call was "dropped" by [employer].   Called back on hold for >30 minutes.   Issue has been resolved New order is now in process and should be shipped in 10-14 business days per rep.

## 2023-10-28 NOTE — Addendum Note (Signed)
Addended byLilian Kapur, Shakevia Sarris R on: 10/28/2023 02:09 PM   Modules accepted: Orders

## 2023-11-01 ENCOUNTER — Telehealth: Payer: Self-pay

## 2023-11-01 MED ORDER — TAMSULOSIN HCL 0.4 MG PO CAPS: 0.8000 mg | ORAL_CAPSULE | Freq: Every day | ORAL | 3 refills | Status: DC

## 2023-11-01 NOTE — Addendum Note (Signed)
 Addended by: Joaquim Nam on: 11/01/2023 05:36 PM   Modules accepted: Orders

## 2023-11-01 NOTE — Telephone Encounter (Signed)
 Reason for CRM: Patient dropped off a form for his handicap sticker - they would like to check to see if it has been completed.

## 2023-11-01 NOTE — Telephone Encounter (Signed)
 Sent. Thanks.

## 2023-11-01 NOTE — Telephone Encounter (Signed)
 Copied from CRM [id number]. Topic: Clinical - Prescription Issue >> Nov 01, 2023 12:32 PM Gaetano Hawthorne wrote: Reason for CRM: Patient is calling in regards to his tamsulosin (FLOMAX) 0.4 MG CAPS capsule refill request - the pharmacy informed him that it was declined.  In reviewing the patient's chart, it stated that the patient is requesting a refill too soon - this will last filled on 08/30/2023 for 90 pills - he takes two a day - it has been over 45 days and he's out of medication, please advise and call the patient.

## 2023-11-02 NOTE — Telephone Encounter (Signed)
 Not sure if you have completed this and turned it in when I was out. Please advise.

## 2023-11-02 NOTE — Telephone Encounter (Signed)
 Left message per dpr advising patient of medication being sent in.

## 2023-11-03 ENCOUNTER — Other Ambulatory Visit: Payer: Self-pay | Admitting: Family Medicine

## 2023-11-03 DIAGNOSIS — Z794 Long term (current) use of insulin: Secondary | ICD-10-CM

## 2023-11-03 NOTE — Telephone Encounter (Signed)
 I checked upfront and nothing.

## 2023-11-03 NOTE — Telephone Encounter (Signed)
 Please pull another copy and I'll work on it. Thanks.

## 2023-11-03 NOTE — Telephone Encounter (Signed)
 I had done this prev and turned it in.  It may be up front for pick up.  If not, let me know.  Thanks.

## 2023-11-03 NOTE — Telephone Encounter (Signed)
 Placed in your tray

## 2023-11-03 NOTE — Telephone Encounter (Signed)
 Thanks

## 2023-11-04 NOTE — Telephone Encounter (Signed)
 Patient is aware that handicap placard paperwork is available for pick up at front desk

## 2023-11-05 ENCOUNTER — Ambulatory Visit: Payer: Self-pay

## 2023-11-05 NOTE — Patient Instructions (Signed)
 Visit Information  Thank you for taking time to visit with me today. Please don't hesitate to contact me if I can be of assistance to you.   Following are the goals we discussed today:   Goals Addressed             This Visit's Progress    Management of health conditions       Interventions Today    Flowsheet Row Most Recent Value  Chronic Disease   Chronic disease during today's visit Diabetes, Hypertension (HTN)  General Interventions   General Interventions Discussed/Reviewed General Interventions Reviewed, Doctor Visits  [evaluation of current treatment plan for listed health condition and patients adherence to plan as established by provider. Assessed blood sugar readings. Assessed BP reading.]  Doctor Visits Discussed/Reviewed Doctor Visits Reviewed  Annabell Sabal upcoming provider visits. Advised to keep follow up visit with providers as recommended.]  Education Interventions   Education Provided Provided Education  [Reviewed rule of 15 for hypoglycemic treatment.  Advised to eat a protein after treating for hypoglycemic event. Advised to notify provider for frequent low blood sugars < 70.]  Nutrition Interventions   Nutrition Discussed/Reviewed Nutrition Reviewed  [Advised to eat at least 2-3 balanced meal per day and healthy snack to help with hypoglycemic events.]  Pharmacy Interventions   Pharmacy Dicussed/Reviewed Pharmacy Topics Reviewed  [medications reviewed/ compliance discussed. Inquired how patient is tolerating Ozempic and if he is having any issues with Nausea/ vomiting/ abdominal discomfort or constipatient or diarrhea. Inquired if patient received medication assistance for Ozempic]              Our next appointment is by telephone on 12/20/23 at 2 pm  Please call the care guide team at [phone] if you need to cancel or reschedule your appointment.   If you are experiencing a Mental Health or Behavioral Health Crisis or need someone to talk to, please call  the Suicide and Crisis Lifeline: 988 call 1-800-273-TALK (toll free, 24 hour hotline)  Patient verbalizes understanding of instructions and care plan provided today and agrees to view in MyChart. Active MyChart status and patient understanding of how to access instructions and care plan via MyChart confirmed with patient.     George Ina RN, BSN, CCM [employer], Population Health Case Manager Phone: [phone]

## 2023-11-05 NOTE — Patient Outreach (Signed)
 Care Coordination   Follow Up Visit Note   11/05/2023 Name: Tony Manning MRN: [id number] DOB: 13-Jul-1961  Tony Manning is a 63 y.o. year old male who sees Joaquim Nam, MD for primary care. I spoke with  Agustina Caroli by phone today.  What matters to the patients health and wellness today?  Patient states he received medication assistance for Ozempic.  He reports taking the Ozempic x 1 month. Patient reports having at least 3 hypoglycemic events a week <70.  Patient denies having any side effects with Ozempic.  Patient reports blood sugars ranges from 116-130.  He reports 7 day average was 144 and 14 day average was 168.   Patient states he hasn't checked his blood pressure recently however his last noted blood pressure was 120/80.    Goals Addressed             This Visit's Progress    Management of health conditions       Interventions Today    Flowsheet Row Most Recent Value  Chronic Disease   Chronic disease during today's visit Diabetes, Hypertension (HTN)  General Interventions   General Interventions Discussed/Reviewed General Interventions Reviewed, Doctor Visits  [evaluation of current treatment plan for listed health condition and patients adherence to plan as established by provider. Assessed blood sugar readings. Assessed BP reading.]  Doctor Visits Discussed/Reviewed Doctor Visits Reviewed  Annabell Sabal upcoming provider visits. Advised to keep follow up visit with providers as recommended.]  Education Interventions   Education Provided Provided Education  [Reviewed rule of 15 for hypoglycemic treatment.  Advised to eat a protein after treating for hypoglycemic event. Advised to notify provider for frequent low blood sugars < 70.]  Nutrition Interventions   Nutrition Discussed/Reviewed Nutrition Reviewed  [Advised to eat at least 2-3 balanced meal per day and healthy snack to help with hypoglycemic events.]  Pharmacy Interventions   Pharmacy  Dicussed/Reviewed Pharmacy Topics Reviewed  [medications reviewed/ compliance discussed. Inquired how patient is tolerating Ozempic and if he is having any issues with Nausea/ vomiting/ abdominal discomfort or constipatient or diarrhea. Inquired if patient received medication assistance for Ozempic]              SDOH assessments and interventions completed:  No     Care Coordination Interventions:  Yes, provided   Follow up plan: Follow up call scheduled for 12/20/23 at 2 pm    Encounter Outcome:  No Answer   George Ina RN, BSN, CCM [place]  [facility], Population Health Case Manager Phone: [phone]

## 2023-11-10 NOTE — Telephone Encounter (Signed)
Patient picked up placard

## 2023-11-12 ENCOUNTER — Encounter: Payer: Self-pay | Admitting: Family Medicine

## 2023-11-12 ENCOUNTER — Ambulatory Visit (INDEPENDENT_AMBULATORY_CARE_PROVIDER_SITE_OTHER): Admitting: Family Medicine

## 2023-11-12 VITALS — BP 144/78 | HR 74 | Temp 98.1°F | Ht 70.0 in | Wt 216.8 lb

## 2023-11-12 DIAGNOSIS — N200 Calculus of kidney: Secondary | ICD-10-CM

## 2023-11-12 DIAGNOSIS — N1831 Chronic kidney disease, stage 3a: Secondary | ICD-10-CM | POA: Diagnosis not present

## 2023-11-12 DIAGNOSIS — Z794 Long term (current) use of insulin: Secondary | ICD-10-CM | POA: Diagnosis not present

## 2023-11-12 DIAGNOSIS — E1149 Type 2 diabetes mellitus with other diabetic neurological complication: Secondary | ICD-10-CM

## 2023-11-12 DIAGNOSIS — E1122 Type 2 diabetes mellitus with diabetic chronic kidney disease: Secondary | ICD-10-CM | POA: Diagnosis not present

## 2023-11-12 MED ORDER — OZEMPIC (0.25 OR 0.5 MG/DOSE) 2 MG/3ML ~~LOC~~ SOPN: 0.5000 mg | PEN_INJECTOR | SUBCUTANEOUS | Status: DC

## 2023-11-12 MED ORDER — GLIMEPIRIDE 2 MG PO TABS: 2.0000 mg | ORAL_TABLET | Freq: Every day | ORAL | Status: DC

## 2023-11-12 MED ORDER — TRESIBA FLEXTOUCH 100 UNIT/ML ~~LOC~~ SOPN: PEN_INJECTOR | SUBCUTANEOUS | Status: DC

## 2023-11-12 NOTE — Patient Instructions (Addendum)
 If you can get a stone fragment collected, then please bring it to the lab.   Stop glimepiride for now.    Go to the lab on the way out.   If you have mychart we'll likely use that to update you.     Let me know about your sugars next week.   Take care.  Glad to see you.

## 2023-11-12 NOTE — Progress Notes (Signed)
 Passed a renal stone today.  He thought he had another fragment to pass. He has a strainer to use in case he passes it.  Not in pain from a stone now.  Followed by [facility].  D/w pt about options.  See AVS.   DM2.  Nocturnal lows.  Taking glimepiride at night.   Taking 50 units until the last few days, down to 40 this AM.  Sugar was 106 this AM.  Taking metformin 500mg  BID.   Just recently moved to 0.5mg  ozempic.   Lower sugars noted the last 5 nights, down to 50.    CGM data estimate A1c 6.4 but incomplete data, d/w pt.   Meds, vitals, and allergies reviewed.   ROS: Per HPI unless specifically indicated in ROS section   GEN: nad, alert and oriented HEENT:ncat NECK: supple w/o LA CV: rrr.  PULM: ctab, no inc wob ABD: soft, +bs EXT: no edema SKIN: well perfused.

## 2023-11-13 LAB — HEMOGLOBIN A1C
Hgb A1c MFr Bld: 8.4 %{Hb} — ABNORMAL HIGH (ref <5.7)
Mean Plasma Glucose: 194 mg/dL
eAG (mmol/L): 10.8 mmol/L

## 2023-11-13 LAB — BASIC METABOLIC PANEL
BUN/Creatinine Ratio: 13 (calc) (ref 6–22)
BUN: 22 mg/dL (ref 7–25)
CO2: 23 mmol/L (ref 20–32)
Calcium: 9.3 mg/dL (ref 8.6–10.3)
Chloride: 107 mmol/L (ref 98–110)
Creat: 1.64 mg/dL — ABNORMAL HIGH (ref 0.70–1.35)
Glucose, Bld: 96 mg/dL (ref 65–99)
Potassium: 4.5 mmol/L (ref 3.5–5.3)
Sodium: 141 mmol/L (ref 135–146)

## 2023-11-14 ENCOUNTER — Encounter: Payer: Self-pay | Admitting: Family Medicine

## 2023-11-14 NOTE — Assessment & Plan Note (Signed)
 H/o nocturnal lows.  Has been taking glimepiride at night.   Taking 50 units until the last few days, down to 40 this AM.  Sugar was 106 this AM.  Taking metformin 500mg  BID.   Just recently moved to 0.5mg  ozempic.   Lower sugars noted the last 5 nights, down to 50.    Stop glimepiride for now.    Continue 0.5mg  ozempic,  metformin 500mg  BID, 40 units insulin. He can let me know about his sugars next week.

## 2023-11-14 NOTE — Assessment & Plan Note (Signed)
 If he can get a stone fragment collected, then he can submit that. See orders.

## 2023-11-16 ENCOUNTER — Ambulatory Visit: Payer: Medicare HMO | Admitting: Podiatry

## 2023-11-26 ENCOUNTER — Telehealth: Payer: Self-pay | Admitting: Family Medicine

## 2023-11-26 ENCOUNTER — Telehealth: Payer: Self-pay

## 2023-11-26 ENCOUNTER — Other Ambulatory Visit (INDEPENDENT_AMBULATORY_CARE_PROVIDER_SITE_OTHER): Payer: Self-pay | Admitting: Pharmacist

## 2023-11-26 DIAGNOSIS — E1149 Type 2 diabetes mellitus with other diabetic neurological complication: Secondary | ICD-10-CM

## 2023-11-26 DIAGNOSIS — Z794 Long term (current) use of insulin: Secondary | ICD-10-CM

## 2023-11-26 NOTE — Telephone Encounter (Signed)
 Called patient and left vm per DPR to advise that he has 4 boxes of [place] available for pickup

## 2023-11-26 NOTE — Telephone Encounter (Signed)
 Copied from CRM [id number]. Topic: Clinical - Prescription Issue >> Nov 26, 2023 11:05 AM [name] wrote: Reason for CRM: Patient called in stating that he received a VM about his Evaristo Bury being ready for pickup. Patient stated that it should have been his Ozempic, so he is slightly confused as to where this [place] order came from. Callback number for patient is [phone] to discuss.

## 2023-11-26 NOTE — Progress Notes (Addendum)
 Patient Assistance Program (PAP)    Manufacturer: Novo Nordisk  Shipment Delay Medication(s): Ozempic 1.0 and 2.0 mg  Patient was provided with information to receive a medication voucher on 11/15/23.  Called into clinic 11/26/23 requesting Ozempic. Did not call for voucher.  Spoke with [employer] today on patient's behalf:  Voucher received via email and included on Rx sent to pharmacy   Ozempic 1.0 mg week x 30 days

## 2023-11-26 NOTE — Telephone Encounter (Signed)
 Copied from CRM [id number]. Topic: General - Other >> Nov 26, 2023  3:18 PM Isabell A wrote: Reason for CRM: Latasha from [employer] patient assistance program calling to speak with [name] in regard to this patient.  Callback number: [phone]

## 2023-11-26 NOTE — Telephone Encounter (Signed)
 Patient picked up medication from office.

## 2023-11-26 NOTE — Telephone Encounter (Signed)
 Closing encounter this was addressed by the pharmacy [name]:  [name], [facility]  P [address] [place], regarding pt recent phone call to clinic.  He confirms he is on [place], just was expecting Ozempic to come as well. He did not call for the free voucher. I have called Novo for his voucher and pended an Rx to Dr. Para March. Patient aware

## 2023-11-26 NOTE — Telephone Encounter (Signed)
 Can you check on this? Was the patient not suppose to get [place]?

## 2023-11-27 ENCOUNTER — Other Ambulatory Visit: Payer: Self-pay | Admitting: Family Medicine

## 2023-11-28 MED ORDER — SEMAGLUTIDE (1 MG/DOSE) 4 MG/3ML ~~LOC~~ SOPN: 1.0000 mg | PEN_INJECTOR | SUBCUTANEOUS | 0 refills | Status: DC

## 2023-11-29 NOTE — Progress Notes (Signed)
 Patient called 11/29/23 regarding Ozempic copay.   Called and spoke with [facility]. Despite the voucher included in the mediation sig, it was not applied.[name] Kitchen Pharmacy technician has applied the voucher. Confirms medication cost for Ozempic has reduced to $0.  Called patient with update.  Left message and sent MyChart Message

## 2023-12-02 DIAGNOSIS — M542 Cervicalgia: Secondary | ICD-10-CM | POA: Diagnosis not present

## 2023-12-02 DIAGNOSIS — Z79891 Long term (current) use of opiate analgesic: Secondary | ICD-10-CM | POA: Diagnosis not present

## 2023-12-02 DIAGNOSIS — M25552 Pain in left hip: Secondary | ICD-10-CM | POA: Diagnosis not present

## 2023-12-02 DIAGNOSIS — M25551 Pain in right hip: Secondary | ICD-10-CM | POA: Diagnosis not present

## 2023-12-02 DIAGNOSIS — M47812 Spondylosis without myelopathy or radiculopathy, cervical region: Secondary | ICD-10-CM | POA: Diagnosis not present

## 2023-12-07 ENCOUNTER — Telehealth: Payer: Self-pay

## 2023-12-07 NOTE — Telephone Encounter (Signed)
 Called patient left vm to advise that he has 2 boxes of ozempic 4 mg and 2 of the ozempic 8 mg. Available for pickup

## 2023-12-08 NOTE — Telephone Encounter (Signed)
 Patient came by and picked up medication.

## 2023-12-09 DIAGNOSIS — Z0100 Encounter for examination of eyes and vision without abnormal findings: Secondary | ICD-10-CM | POA: Diagnosis not present

## 2023-12-09 DIAGNOSIS — H25041 Posterior subcapsular polar age-related cataract, right eye: Secondary | ICD-10-CM | POA: Diagnosis not present

## 2023-12-09 DIAGNOSIS — H5203 Hypermetropia, bilateral: Secondary | ICD-10-CM | POA: Diagnosis not present

## 2023-12-09 DIAGNOSIS — H35013 Changes in retinal vascular appearance, bilateral: Secondary | ICD-10-CM | POA: Diagnosis not present

## 2023-12-09 DIAGNOSIS — E113293 Type 2 diabetes mellitus with mild nonproliferative diabetic retinopathy without macular edema, bilateral: Secondary | ICD-10-CM | POA: Diagnosis not present

## 2023-12-09 DIAGNOSIS — H52223 Regular astigmatism, bilateral: Secondary | ICD-10-CM | POA: Diagnosis not present

## 2023-12-09 DIAGNOSIS — H524 Presbyopia: Secondary | ICD-10-CM | POA: Diagnosis not present

## 2023-12-09 LAB — HM DIABETES EYE EXAM

## 2023-12-20 ENCOUNTER — Ambulatory Visit: Payer: Self-pay

## 2023-12-20 ENCOUNTER — Other Ambulatory Visit: Payer: Self-pay

## 2023-12-20 NOTE — Patient Outreach (Signed)
 Complex Care Management   Visit Note  12/20/2023  Name:  CARRINGTON OLAZABAL MRN: [id number] DOB: 1961/02/19  Situation: Referral received for Complex Care Management related to  diabetes / hypertension  I obtained verbal consent from Patient.  Visit completed with patient  on the phone  Background:   Past Medical History:  Diagnosis Date   Anginal pain (HCC)    Basal cell carcinoma of face    Chest pain, unspecified    Chronic kidney disease    kidney stones   Coronary atherosclerosis of native coronary artery    s/p stent x5   Depression    Diabetes mellitus without mention of complication    Diverticulosis    Dyspnea    Dysrhythmia    Erectile dysfunction    Essential hypertension, benign    Family history of adverse reaction to anesthesia    GERD (gastroesophageal reflux disease)    Heart murmur    History of kidney stones    History of nephrolithiasis    Hyperlipidemia    Migraine    Myocardial infarction (HCC)    2016   Neuromuscular disorder (HCC)    Osteoarthritis    Pneumonia    Postsurgical percutaneous transluminal coronary angioplasty status    s/p stent x4   Sarcoidosis    Sleep apnea    uses CPAP   Unspecified sleep apnea    uses C-pap    Assessment: Patient Reported Symptoms:  Cognitive Cognitive Status: Alert and oriented to person, place, and time, Insightful and able to interpret abstract concepts, Normal speech and language skills Cognitive/Intellectual Conditions Management [RPT]: None reported or documented in medical history or problem list   Health Maintenance Behaviors: Annual physical exam Healing Pattern: Average Health Facilitated by: Pain control  Neurological   Neurological Conditions: Headache Neurological Management Strategies: Routine screening (wearing glasses) Neurological Self-Management Outcome: 4 (good)  HEENT HEENT Symptoms Reported: Change or loss of hearing (patient states he has a hard time hearing things over the  last few years. Hasn't had test in past 5 years.  RNCM recommended follow up with primary provider to report hearing difficulty.) HEENT Conditions:  (none per patient.) HEENT Management Strategies: Routine screening HEENT Self-Management Outcome: 3 (uncertain) HEENT Comment: patient states he's been dealing with hearing difficulty for years.  He reports last hearing test was approximately 5 years ago.  RN case manager patient follow up with his primary provider to evaluate hearing difficulty.  (none per patient.)  Cardiovascular Cardiovascular Symptoms Reported: Chest pain or discomfort, No symptoms reported Does patient have uncontrolled Hypertension?: No Cardiovascular Conditions: Hypertension, Coronary artery disease (patient reports history of heart attack and 5 stents.) Cardiovascular Management Strategies: Medication therapy, Routine screening Cardiovascular Self-Management Outcome: 4 (good)  Respiratory Respiratory Symptoms Reported: No symptoms reported    Endocrine Patient reports the following symptoms related to hypoglycemia or hyperglycemia : No symptoms reported Is patient diabetic?: Yes Is patient checking blood sugars at home?: Yes Endocrine Conditions: Diabetes Endocrine Management Strategies: Routine screening, Medication therapy, Diet modification Endocrine Self-Management Outcome: 3 (uncertain) Endocrine Comment: patient states blood sugars range from 110-160. He reports 30 day average was 149. Most recent Hgb A1c on 11/12/23 was 8.4. Patient reports using free style libre. Patient reports frequent blood sugars <70 over the last several weeks. Reports having follow up visit with primary provider on 11/12/23 and reporting low blood sugars. Medication adjustment was made: discontinuation of glimipiride. Per chart review patient was asked by provider to report back blood sugar  readings in 1 week after medication adjustment. Patient states he did not follow up with provider as  requested. Patient states he had ongoing low blood sugars for 11/2023 at least 4-5 per night of 48-69. States he has not had any low blood sugars recently since 12/12/2023. Patient advised to contact provider through Mychart to report recurrent low blood sugars.  Gastrointestinal Gastrointestinal Symptoms Reported: No symptoms reported      Genitourinary      Integumentary Integumentary Symptoms Reported: No symptoms reported    Musculoskeletal Musculoskelatal Symptoms Reviewed: Other Other Musculoskeletal Symptoms: Patient reports having chronic back and bilateral hip pain Musculoskeletal Conditions: Back pain, Other Other Musculoskeletal Conditions: chronic bilateral hip pain. Patient states he sees pain management doctor for medication management. Musculoskeletal Management Strategies: Medication therapy, Routine screening Musculoskeletal Self-Management Outcome: 3 (uncertain) Falls in the past year?: No Number of falls in past year: 1 or less Was there an injury with Fall?: No Fall Risk Category Calculator: 0 Patient Fall Risk Level: Low Fall Risk Patient at Risk for Falls Due to: Other (Comment) (chronic back/ bilateral hip pain. Denies impaired mobility or gait.)  Psychosocial Psychosocial Symptoms Reported: No symptoms reported     Quality of Family Relationships: supportive Do you feel physically threatened by others?: No      11/12/2023    3:13 PM  Depression screen PHQ 2/9  Decreased Interest 0  Down, Depressed, Hopeless 0  PHQ - 2 Score 0  Altered sleeping 1  Tired, decreased energy 1  Change in appetite 0  Feeling bad or failure about yourself  0  Trouble concentrating 0  Moving slowly or fidgety/restless 0  Suicidal thoughts 0  PHQ-9 Score 2  Difficult doing work/chores Somewhat difficult    Vitals:    Medications Reviewed Today     Reviewed by Ameirah Khatoon E, RN (Registered Nurse) on 12/20/23 at 1450  Med List Status: <None>   Medication Order Taking? Sig  Documenting Provider Last Dose Status Informant  aspirin EC 81 MG tablet [id number] Yes Take 81 mg by mouth daily. Swallow whole. [provider] Taking Active Self  atorvastatin (LIPITOR) 40 MG tablet [id number] Yes TAKE 1 TABLET BY MOUTH EVERY MORNING Donnie Galea, MD Taking Active   carvedilol (COREG) 3.125 MG tablet [id number] Yes Take 1 tablet (3.125 mg total) by mouth 2 (two) times daily. Donnie Galea, MD Taking Active Self  Continuous Glucose Sensor (FREESTYLE LIBRE 3 PLUS SENSOR) [place] [id number]  Use to check blood sugar continuously. Change sensor every 15 days. Donnie Galea, MD  Active   cyclobenzaprine (FLEXERIL) 5 MG tablet [id number] Yes TAKE 1 TABLET BY MOUTH EVERYDAY AT BEDTIME Donnie Galea, MD Taking Active   diclofenac Sodium (VOLTAREN) 1 % GEL [id number] Yes Apply 2-4 g topically 4 (four) times daily. Donnie Galea, MD Taking Active Self  gabapentin (NEURONTIN) 300 MG capsule [id number] Yes TAKE 1-2 CAPSULES BY MOUTH IN THE EVENING. MAY CAUSE SEDATION Donnie Galea, MD Taking Active   insulin degludec (TRESIBA FLEXTOUCH) 100 UNIT/ML FlexTouch Pen [id number] Yes INJECT 40 UNITS SUBCUTANEOUSLY DAILY. Day supply 30. Donnie Galea, MD Taking Active   losartan (COZAAR) 50 MG tablet [id number] Yes Take 1 tablet (50 mg total) by mouth daily. Donnie Galea, MD Taking Active Self  metFORMIN (GLUCOPHAGE-XR) 500 MG 24 hr tablet [id number] Yes TAKE 1 TABLET BY MOUTH 2 TIMES DAILY WITH A MEAL. Donnie Galea, MD Taking Active   Na Sulfate-K Sulfate-Mg Sulf 17.5-3.13-1.6 GM/177ML  Leonard Raker [id number] Yes Use as directed; may use generic; goodrx card if insurance will not cover generic Tory Freiberg, NP Taking Active   nitroGLYCERIN (NITROSTAT) 0.4 MG SL tablet [id number] Yes Place 1 tablet (0.4 mg total) under the tongue every 5 (five) minutes as needed for chest pain. Druscilla Gerhard, NP Taking Active Self           Med Note Harman Lightning Mar 25, 2023  9:49 AM)    ondansetron (ZOFRAN) 4 MG tablet [id number] Yes Take 4 mg by mouth every 8 (eight) hours as needed for nausea or vomiting. [provider] Taking Active Self  oxyCODONE-acetaminophen (PERCOCET/ROXICET) 5-325 MG tablet [id number] Yes Take 0.5-1 tablets by mouth every 6 (six) hours as needed for severe pain. [provider] Taking Active Self           Med Note Georgiana Kirks R   Tue Feb 23, 2023  1:33 PM)    pantoprazole (PROTONIX) 40 MG tablet [id number] Yes TAKE 1 TABLET BY MOUTH EVERY MORNING Donnie Galea, MD Taking Active   Semaglutide, 1 MG/DOSE, 4 MG/3ML SOPN [id number] Yes Inject 1 mg as directed once a week. Please bill as primary: BIN [id number], PCN [id number], GRP [id number], ID [id number] Donnie Galea, MD Taking Active   tamsulosin (FLOMAX) 0.4 MG CAPS capsule [id number] Yes Take 2 capsules (0.8 mg total) by mouth daily. Donnie Galea, MD Taking Active             Recommendation:   PCP Follow-up Advised to notify provider of ongoing frequent low blood sugar <70 through Mychart or call office.   Follow Up Plan:   Telephone follow-up in 1 month with RN case manager   Verba Girt RN, BSN, CCM [place]  [facility], Population Health Case Manager Phone: [phone]

## 2023-12-20 NOTE — Patient Instructions (Signed)
 Visit Information  Thank you for taking time to visit with me today. Please don't hesitate to contact me if I can be of assistance to you before our next scheduled appointment.  Our next appointment is by telephone on 01/28/24 at 2 pm Please call the care guide team at [phone] if you need to cancel or reschedule your appointment.   Following is a copy of your care plan:   Goals Addressed             This Visit's Progress    COMPLETED: Management of health conditions       Interventions Today    Flowsheet Row Most Recent Value  Chronic Disease   Chronic disease during today's visit Diabetes, Hypertension (HTN)  General Interventions   General Interventions Discussed/Reviewed General Interventions Reviewed, Doctor Visits  [evaluation of current treatment plan for listed health condition and patients adherence to plan as established by provider. Assessed blood sugar readings. Assessed BP reading.]  Doctor Visits Discussed/Reviewed Doctor Visits Reviewed  Carin Charleston upcoming provider visits. Advised to keep follow up visit with providers as recommended.]  Education Interventions   Education Provided Provided Education  [Reviewed rule of 15 for hypoglycemic treatment.  Advised to eat a protein after treating for hypoglycemic event. Advised to notify provider for frequent low blood sugars < 70.]  Nutrition Interventions   Nutrition Discussed/Reviewed Nutrition Reviewed  [Advised to eat at least 2-3 balanced meal per day and healthy snack to help with hypoglycemic events.]  Pharmacy Interventions   Pharmacy Dicussed/Reviewed Pharmacy Topics Reviewed  [medications reviewed/ compliance discussed. Inquired how patient is tolerating Ozempic and if he is having any issues with Nausea/ vomiting/ abdominal discomfort or constipatient or diarrhea. Inquired if patient received medication assistance for Ozempic]           VBCI RN Care Plan- Hypertension       Problems:  Chronic Disease  Management support and education needs related to HTN  Goal: Over the next 3 months the Patient will attend all scheduled medical appointments: with providers as evidenced by patient report/ chart review.         continue to work with Medical illustrator and/or Social Worker to address care management and care coordination needs related to HTN as evidenced by adherence to CM Team Scheduled appointments     demonstrate Ongoing adherence to prescribed treatment plan for HTN as evidenced by patient report/ chart review.  take all medications exactly as prescribed and will call provider for medication related questions as evidenced by patient report / chart review.     Patient blood pressure will range within provider established parameters.  Patient will monitor blood pressure at least 2-3 times per week and record.  Interventions:   Hypertension Interventions: Last practice recorded BP readings:  BP Readings from Last 3 Encounters:  11/12/23 (!) 144/78  09/10/23 (!) 142/72  07/29/23 124/68   Most recent eGFR/CrCl: No results found for: "EGFR"  No components found for: "CRCL"  Evaluation of current treatment plan related to hypertension self management and patient's adherence to plan as established by provider Reviewed medications with patient and discussed importance of compliance Discussed plans with patient for ongoing care management follow up and provided patient with direct contact information for care management team Advised patient, providing education and rationale, to monitor blood pressure daily and record, calling PCP for findings outside established parameters Reviewed scheduled/upcoming provider appointments including:   Patient Self-Care Activities:  Attend all scheduled provider appointments Call pharmacy for  medication refills 3-7 days in advance of running out of medications Call provider office for new concerns or questions  Take medications as prescribed   check blood  pressure 3 times per week choose a place to take my blood pressure (home, clinic or office, retail store) write blood pressure results in a log or diary take blood pressure log to all doctor appointments call doctor for signs and symptoms of high blood pressure keep all doctor appointments take medications for blood pressure exactly as prescribed  Plan:  Telephone follow up appointment with care management team member scheduled for:  01/28/24 at 2 pm          VBCI RN Care Plan-diabetes       Problems:  Chronic Disease Management support and education needs related to DMII Knowledge Deficits related to DMII  Goal: Over the next 3 months the Patient will attend all scheduled medical appointments: with providers as evidenced by patient report/ chart review.         continue to work with Medical illustrator and/or Social Worker to address care management and care coordination needs related to DMII as evidenced by adherence to CM Team Scheduled appointments     demonstrate Ongoing adherence to prescribed treatment plan for DMII as evidenced by patient report/ chart review.  take all medications exactly as prescribed and will call provider for medication related questions as evidenced by patient report/ chart review.     verbalize understanding of plan for management of DMII as evidenced by patient report/ chart review.  Patient will report decrease in hypoglycemic events.   Interventions:   Diabetes Interventions: Assessed patient's understanding of A1c goal: <7% Provided education to patient about basic DM disease process Reviewed medications with patient and discussed importance of medication adherence Discussed plans with patient for ongoing care management follow up and provided patient with direct contact information for care management team Reviewed scheduled/upcoming provider appointments including:   Advised patient, providing education and rationale, to check cbg readings and  record, calling primary care provider. for findings outside established parameters Assessed social determinant of health barriers Discussed Rule of 15 for hypoglycemic management Discussed plate method for diabetic diet management. Advised to decrease/ limit high carbohydrate foods, sweetened drinks Lab Results  Component Value Date   HGBA1C 8.4 (H) 11/12/2023    Patient Self-Care Activities:  Attend all scheduled provider appointments Call pharmacy for medication refills 3-7 days in advance of running out of medications Call provider office for new concerns or questions  Take medications as prescribed   check blood sugar at prescribed times: before meals and at bedtime and when you have symptoms of low or high blood sugar check feet daily for cuts, sores or redness take the blood sugar log to all doctor visits fill half of plate with vegetables manage portion size  Plan:  Telephone follow up appointment with care management team member scheduled for:  01/28/24 at 2 pm             Please call the Suicide and Crisis Lifeline: 988 call 1-800-273-TALK (toll free, 24 hour hotline) if you are experiencing a Mental Health or Behavioral Health Crisis or need someone to talk to.  Patient verbalizes understanding of instructions and care plan provided today and agrees to view in MyChart. Active MyChart status and patient understanding of how to access instructions and care plan via MyChart confirmed with patient.     George Ina RN, BSN, CCM [facility]  Population Health Case Manager Phone: [phone]

## 2023-12-25 ENCOUNTER — Other Ambulatory Visit: Payer: Self-pay | Admitting: Family Medicine

## 2023-12-25 DIAGNOSIS — E1149 Type 2 diabetes mellitus with other diabetic neurological complication: Secondary | ICD-10-CM

## 2023-12-27 NOTE — Telephone Encounter (Signed)
 Last refill 11/28/23 3ml W/ 0 refill   LOV 11/12/23  NOV nothing scheduled

## 2023-12-30 DIAGNOSIS — M47812 Spondylosis without myelopathy or radiculopathy, cervical region: Secondary | ICD-10-CM | POA: Diagnosis not present

## 2023-12-30 DIAGNOSIS — M542 Cervicalgia: Secondary | ICD-10-CM | POA: Diagnosis not present

## 2023-12-30 DIAGNOSIS — M533 Sacrococcygeal disorders, not elsewhere classified: Secondary | ICD-10-CM | POA: Diagnosis not present

## 2023-12-30 DIAGNOSIS — M25551 Pain in right hip: Secondary | ICD-10-CM | POA: Diagnosis not present

## 2023-12-30 DIAGNOSIS — Z79891 Long term (current) use of opiate analgesic: Secondary | ICD-10-CM | POA: Diagnosis not present

## 2023-12-30 DIAGNOSIS — M25552 Pain in left hip: Secondary | ICD-10-CM | POA: Diagnosis not present

## 2023-12-30 DIAGNOSIS — M545 Low back pain, unspecified: Secondary | ICD-10-CM | POA: Diagnosis not present

## 2024-01-03 ENCOUNTER — Ambulatory Visit: Admitting: Podiatry

## 2024-01-04 ENCOUNTER — Emergency Department (HOSPITAL_COMMUNITY)

## 2024-01-04 ENCOUNTER — Encounter (HOSPITAL_COMMUNITY): Payer: Self-pay | Admitting: Neurology

## 2024-01-04 ENCOUNTER — Other Ambulatory Visit: Payer: Self-pay

## 2024-01-04 ENCOUNTER — Inpatient Hospital Stay (HOSPITAL_COMMUNITY)
Admission: EM | Admit: 2024-01-04 | Discharge: 2024-01-06 | DRG: 041 | Disposition: A | Discharge status: Home / Self Care | Attending: Neurology | Admitting: Neurology

## 2024-01-04 DIAGNOSIS — Z794 Long term (current) use of insulin: Secondary | ICD-10-CM | POA: Diagnosis not present

## 2024-01-04 DIAGNOSIS — R278 Other lack of coordination: Secondary | ICD-10-CM | POA: Diagnosis present

## 2024-01-04 DIAGNOSIS — E1122 Type 2 diabetes mellitus with diabetic chronic kidney disease: Secondary | ICD-10-CM | POA: Diagnosis not present

## 2024-01-04 DIAGNOSIS — I129 Hypertensive chronic kidney disease with stage 1 through stage 4 chronic kidney disease, or unspecified chronic kidney disease: Secondary | ICD-10-CM | POA: Diagnosis not present

## 2024-01-04 DIAGNOSIS — K219 Gastro-esophageal reflux disease without esophagitis: Secondary | ICD-10-CM | POA: Diagnosis present

## 2024-01-04 DIAGNOSIS — Z981 Arthrodesis status: Secondary | ICD-10-CM

## 2024-01-04 DIAGNOSIS — I253 Aneurysm of heart: Secondary | ICD-10-CM | POA: Diagnosis not present

## 2024-01-04 DIAGNOSIS — F32A Depression, unspecified: Secondary | ICD-10-CM | POA: Diagnosis present

## 2024-01-04 DIAGNOSIS — M1990 Unspecified osteoarthritis, unspecified site: Secondary | ICD-10-CM | POA: Diagnosis present

## 2024-01-04 DIAGNOSIS — Z85828 Personal history of other malignant neoplasm of skin: Secondary | ICD-10-CM | POA: Diagnosis not present

## 2024-01-04 DIAGNOSIS — Z955 Presence of coronary angioplasty implant and graft: Secondary | ICD-10-CM | POA: Diagnosis not present

## 2024-01-04 DIAGNOSIS — Z8261 Family history of arthritis: Secondary | ICD-10-CM

## 2024-01-04 DIAGNOSIS — Z560 Unemployment, unspecified: Secondary | ICD-10-CM

## 2024-01-04 DIAGNOSIS — R42 Dizziness and giddiness: Secondary | ICD-10-CM | POA: Diagnosis not present

## 2024-01-04 DIAGNOSIS — I280 Arteriovenous fistula of pulmonary vessels: Secondary | ICD-10-CM | POA: Diagnosis not present

## 2024-01-04 DIAGNOSIS — I2510 Atherosclerotic heart disease of native coronary artery without angina pectoris: Secondary | ICD-10-CM | POA: Diagnosis not present

## 2024-01-04 DIAGNOSIS — E785 Hyperlipidemia, unspecified: Secondary | ICD-10-CM | POA: Diagnosis not present

## 2024-01-04 DIAGNOSIS — I6503 Occlusion and stenosis of bilateral vertebral arteries: Secondary | ICD-10-CM | POA: Diagnosis not present

## 2024-01-04 DIAGNOSIS — H5509 Other forms of nystagmus: Secondary | ICD-10-CM | POA: Diagnosis present

## 2024-01-04 DIAGNOSIS — R531 Weakness: Secondary | ICD-10-CM | POA: Diagnosis not present

## 2024-01-04 DIAGNOSIS — I63431 Cerebral infarction due to embolism of right posterior cerebral artery: Secondary | ICD-10-CM | POA: Diagnosis not present

## 2024-01-04 DIAGNOSIS — Z7984 Long term (current) use of oral hypoglycemic drugs: Secondary | ICD-10-CM

## 2024-01-04 DIAGNOSIS — I63532 Cerebral infarction due to unspecified occlusion or stenosis of left posterior cerebral artery: Secondary | ICD-10-CM | POA: Diagnosis not present

## 2024-01-04 DIAGNOSIS — R29706 NIHSS score 6: Secondary | ICD-10-CM | POA: Diagnosis not present

## 2024-01-04 DIAGNOSIS — N400 Enlarged prostate without lower urinary tract symptoms: Secondary | ICD-10-CM | POA: Diagnosis present

## 2024-01-04 DIAGNOSIS — I63442 Cerebral infarction due to embolism of left cerebellar artery: Secondary | ICD-10-CM | POA: Diagnosis not present

## 2024-01-04 DIAGNOSIS — E1165 Type 2 diabetes mellitus with hyperglycemia: Secondary | ICD-10-CM | POA: Diagnosis not present

## 2024-01-04 DIAGNOSIS — G4489 Other headache syndrome: Secondary | ICD-10-CM | POA: Diagnosis not present

## 2024-01-04 DIAGNOSIS — R29702 NIHSS score 2: Secondary | ICD-10-CM | POA: Diagnosis not present

## 2024-01-04 DIAGNOSIS — I63432 Cerebral infarction due to embolism of left posterior cerebral artery: Principal | ICD-10-CM | POA: Diagnosis present

## 2024-01-04 DIAGNOSIS — Z8701 Personal history of pneumonia (recurrent): Secondary | ICD-10-CM

## 2024-01-04 DIAGNOSIS — Z888 Allergy status to other drugs, medicaments and biological substances status: Secondary | ICD-10-CM

## 2024-01-04 DIAGNOSIS — Z7985 Long-term (current) use of injectable non-insulin antidiabetic drugs: Secondary | ICD-10-CM | POA: Diagnosis not present

## 2024-01-04 DIAGNOSIS — I6502 Occlusion and stenosis of left vertebral artery: Secondary | ICD-10-CM | POA: Diagnosis not present

## 2024-01-04 DIAGNOSIS — I639 Cerebral infarction, unspecified: Principal | ICD-10-CM

## 2024-01-04 DIAGNOSIS — G43909 Migraine, unspecified, not intractable, without status migrainosus: Secondary | ICD-10-CM | POA: Diagnosis present

## 2024-01-04 DIAGNOSIS — G8194 Hemiplegia, unspecified affecting left nondominant side: Secondary | ICD-10-CM | POA: Diagnosis present

## 2024-01-04 DIAGNOSIS — R29701 NIHSS score 1: Secondary | ICD-10-CM | POA: Diagnosis not present

## 2024-01-04 DIAGNOSIS — I517 Cardiomegaly: Secondary | ICD-10-CM | POA: Diagnosis not present

## 2024-01-04 DIAGNOSIS — Z7902 Long term (current) use of antithrombotics/antiplatelets: Secondary | ICD-10-CM

## 2024-01-04 DIAGNOSIS — Z79899 Other long term (current) drug therapy: Secondary | ICD-10-CM | POA: Diagnosis not present

## 2024-01-04 DIAGNOSIS — I63542 Cerebral infarction due to unspecified occlusion or stenosis of left cerebellar artery: Secondary | ICD-10-CM | POA: Diagnosis not present

## 2024-01-04 DIAGNOSIS — I663 Occlusion and stenosis of cerebellar arteries: Secondary | ICD-10-CM | POA: Diagnosis not present

## 2024-01-04 DIAGNOSIS — Z8249 Family history of ischemic heart disease and other diseases of the circulatory system: Secondary | ICD-10-CM

## 2024-01-04 DIAGNOSIS — I6389 Other cerebral infarction: Secondary | ICD-10-CM | POA: Diagnosis not present

## 2024-01-04 DIAGNOSIS — R29700 NIHSS score 0: Secondary | ICD-10-CM | POA: Diagnosis not present

## 2024-01-04 DIAGNOSIS — R29703 NIHSS score 3: Secondary | ICD-10-CM | POA: Diagnosis not present

## 2024-01-04 DIAGNOSIS — G4733 Obstructive sleep apnea (adult) (pediatric): Secondary | ICD-10-CM | POA: Diagnosis present

## 2024-01-04 DIAGNOSIS — I672 Cerebral atherosclerosis: Secondary | ICD-10-CM | POA: Diagnosis not present

## 2024-01-04 DIAGNOSIS — N1831 Chronic kidney disease, stage 3a: Secondary | ICD-10-CM | POA: Diagnosis not present

## 2024-01-04 DIAGNOSIS — Z7982 Long term (current) use of aspirin: Secondary | ICD-10-CM

## 2024-01-04 DIAGNOSIS — Z87891 Personal history of nicotine dependence: Secondary | ICD-10-CM | POA: Diagnosis not present

## 2024-01-04 DIAGNOSIS — E119 Type 2 diabetes mellitus without complications: Secondary | ICD-10-CM | POA: Diagnosis not present

## 2024-01-04 DIAGNOSIS — I10 Essential (primary) hypertension: Secondary | ICD-10-CM | POA: Diagnosis not present

## 2024-01-04 DIAGNOSIS — R011 Cardiac murmur, unspecified: Secondary | ICD-10-CM | POA: Diagnosis present

## 2024-01-04 DIAGNOSIS — Z833 Family history of diabetes mellitus: Secondary | ICD-10-CM

## 2024-01-04 DIAGNOSIS — Z818 Family history of other mental and behavioral disorders: Secondary | ICD-10-CM

## 2024-01-04 DIAGNOSIS — Z87442 Personal history of urinary calculi: Secondary | ICD-10-CM

## 2024-01-04 DIAGNOSIS — Q2112 Patent foramen ovale: Secondary | ICD-10-CM | POA: Diagnosis not present

## 2024-01-04 DIAGNOSIS — I252 Old myocardial infarction: Secondary | ICD-10-CM

## 2024-01-04 DIAGNOSIS — D869 Sarcoidosis, unspecified: Secondary | ICD-10-CM | POA: Diagnosis present

## 2024-01-04 DIAGNOSIS — Z885 Allergy status to narcotic agent status: Secondary | ICD-10-CM

## 2024-01-04 DIAGNOSIS — I6523 Occlusion and stenosis of bilateral carotid arteries: Secondary | ICD-10-CM | POA: Diagnosis not present

## 2024-01-04 DIAGNOSIS — Z823 Family history of stroke: Secondary | ICD-10-CM

## 2024-01-04 LAB — I-STAT CHEM 8, ED
BUN: 31 mg/dL — ABNORMAL HIGH (ref 8–23)
Calcium, Ion: 1.12 mmol/L — ABNORMAL LOW (ref 1.15–1.40)
Chloride: 105 mmol/L (ref 98–111)
Creatinine, Ser: 1.4 mg/dL — ABNORMAL HIGH (ref 0.61–1.24)
Glucose, Bld: 238 mg/dL — ABNORMAL HIGH (ref 70–99)
HCT: 42 % (ref 39.0–52.0)
Hemoglobin: 14.3 g/dL (ref 13.0–17.0)
Potassium: 3.5 mmol/L (ref 3.5–5.1)
Sodium: 139 mmol/L (ref 135–145)
TCO2: 21 mmol/L — ABNORMAL LOW (ref 22–32)

## 2024-01-04 LAB — HIV ANTIBODY (ROUTINE TESTING W REFLEX): HIV Screen 4th Generation wRfx: NONREACTIVE

## 2024-01-04 LAB — DIFFERENTIAL
Abs Immature Granulocytes: 0.04 10*3/uL (ref 0.00–0.07)
Basophils Absolute: 0.1 10*3/uL (ref 0.0–0.1)
Basophils Relative: 1 %
Eosinophils Absolute: 0.1 10*3/uL (ref 0.0–0.5)
Eosinophils Relative: 1 %
Immature Granulocytes: 0 %
Lymphocytes Relative: 17 %
Lymphs Abs: 1.8 10*3/uL (ref 0.7–4.0)
Monocytes Absolute: 0.8 10*3/uL (ref 0.1–1.0)
Monocytes Relative: 8 %
Neutro Abs: 7.6 10*3/uL (ref 1.7–7.7)
Neutrophils Relative %: 73 %

## 2024-01-04 LAB — COMPREHENSIVE METABOLIC PANEL WITH GFR
ALT: 39 U/L (ref 0–44)
AST: 28 U/L (ref 15–41)
Albumin: 3.8 g/dL (ref 3.5–5.0)
Alkaline Phosphatase: 69 U/L (ref 38–126)
Anion gap: 12 (ref 5–15)
BUN: 30 mg/dL — ABNORMAL HIGH (ref 8–23)
CO2: 20 mmol/L — ABNORMAL LOW (ref 22–32)
Calcium: 9.1 mg/dL (ref 8.9–10.3)
Chloride: 105 mmol/L (ref 98–111)
Creatinine, Ser: 1.45 mg/dL — ABNORMAL HIGH (ref 0.61–1.24)
GFR, Estimated: 54 mL/min — ABNORMAL LOW (ref >60)
Glucose, Bld: 237 mg/dL — ABNORMAL HIGH (ref 70–99)
Potassium: 3.6 mmol/L (ref 3.5–5.1)
Sodium: 137 mmol/L (ref 135–145)
Total Bilirubin: 1.1 mg/dL (ref 0.0–1.2)
Total Protein: 6.9 g/dL (ref 6.5–8.1)

## 2024-01-04 LAB — PROTIME-INR
INR: 1.1 (ref 0.8–1.2)
Prothrombin Time: 14.3 s (ref 11.4–15.2)

## 2024-01-04 LAB — CBC
HCT: 43.6 % (ref 39.0–52.0)
Hemoglobin: 15.1 g/dL (ref 13.0–17.0)
MCH: 30.8 pg (ref 26.0–34.0)
MCHC: 34.6 g/dL (ref 30.0–36.0)
MCV: 88.8 fL (ref 80.0–100.0)
Platelets: 221 10*3/uL (ref 150–400)
RBC: 4.91 MIL/uL (ref 4.22–5.81)
RDW: 12.8 % (ref 11.5–15.5)
WBC: 10.3 10*3/uL (ref 4.0–10.5)
nRBC: 0 % (ref 0.0–0.2)

## 2024-01-04 LAB — CBG MONITORING, ED: Glucose-Capillary: 232 mg/dL — ABNORMAL HIGH (ref 70–99)

## 2024-01-04 LAB — MRSA NEXT GEN BY PCR, NASAL: MRSA by PCR Next Gen: NOT DETECTED

## 2024-01-04 LAB — GLUCOSE, CAPILLARY
Glucose-Capillary: 234 mg/dL — ABNORMAL HIGH (ref 70–99)
Glucose-Capillary: 256 mg/dL — ABNORMAL HIGH (ref 70–99)

## 2024-01-04 LAB — APTT: aPTT: 23 s — ABNORMAL LOW (ref 24–36)

## 2024-01-04 LAB — ETHANOL: Alcohol, Ethyl (B): <15 mg/dL (ref <15)

## 2024-01-04 MED ORDER — TENECTEPLASE FOR STROKE
0.2500 mg/kg | PACK | Freq: Once | INTRAVENOUS | Status: AC
  Administered 2024-01-04: 24 mg via INTRAVENOUS
  Filled 2024-01-04: qty 10

## 2024-01-04 MED ORDER — ACETAMINOPHEN 160 MG/5ML PO SOLN: 650.0000 mg | ORAL | Status: DC | PRN

## 2024-01-04 MED ORDER — ATORVASTATIN CALCIUM 40 MG PO TABS
40.0000 mg | ORAL_TABLET | Freq: Every morning | ORAL | Status: DC
  Administered 2024-01-05 – 2024-01-06 (×2): 40 mg via ORAL
  Filled 2024-01-04 (×2): qty 1

## 2024-01-04 MED ORDER — CHLORHEXIDINE GLUCONATE CLOTH 2 % EX PADS
6.0000 | MEDICATED_PAD | Freq: Every day | CUTANEOUS | Status: DC
  Administered 2024-01-04 – 2024-01-06 (×3): 6 via TOPICAL

## 2024-01-04 MED ORDER — ACETAMINOPHEN 325 MG PO TABS
650.0000 mg | ORAL_TABLET | ORAL | Status: DC | PRN
  Administered 2024-01-06: 650 mg via ORAL
  Filled 2024-01-04: qty 2

## 2024-01-04 MED ORDER — ACETAMINOPHEN 650 MG RE SUPP: 650.0000 mg | RECTAL | Status: DC | PRN

## 2024-01-04 MED ORDER — ORAL CARE MOUTH RINSE: 15.0000 mL | OROMUCOSAL | Status: DC | PRN

## 2024-01-04 MED ORDER — PANTOPRAZOLE SODIUM 40 MG IV SOLR
40.0000 mg | Freq: Every day | INTRAVENOUS | Status: DC
  Administered 2024-01-04: 40 mg via INTRAVENOUS
  Filled 2024-01-04: qty 10

## 2024-01-04 MED ORDER — IOHEXOL 350 MG/ML SOLN
75.0000 mL | Freq: Once | INTRAVENOUS | Status: AC | PRN
  Administered 2024-01-04: 75 mL via INTRAVENOUS

## 2024-01-04 MED ORDER — CARVEDILOL 3.125 MG PO TABS
3.1250 mg | ORAL_TABLET | Freq: Two times a day (BID) | ORAL | Status: DC
  Administered 2024-01-04 – 2024-01-06 (×4): 3.125 mg via ORAL
  Filled 2024-01-04 (×4): qty 1

## 2024-01-04 MED ORDER — NICARDIPINE HCL IN NACL 20-0.86 MG/200ML-% IV SOLN: 0.0000 mg/h | INTRAVENOUS | Status: DC

## 2024-01-04 MED ORDER — CYCLOBENZAPRINE HCL 10 MG PO TABS
5.0000 mg | ORAL_TABLET | Freq: Every day | ORAL | Status: DC
  Administered 2024-01-04 – 2024-01-05 (×2): 5 mg via ORAL
  Filled 2024-01-04 (×2): qty 1

## 2024-01-04 MED ORDER — SENNOSIDES-DOCUSATE SODIUM 8.6-50 MG PO TABS: 1.0000 | ORAL_TABLET | Freq: Every evening | ORAL | Status: DC | PRN

## 2024-01-04 MED ORDER — OXYCODONE-ACETAMINOPHEN 5-325 MG PO TABS
0.5000 | ORAL_TABLET | Freq: Four times a day (QID) | ORAL | Status: DC | PRN
  Filled 2024-01-04: qty 1

## 2024-01-04 MED ORDER — STROKE: EARLY STAGES OF RECOVERY BOOK
Freq: Once | Status: AC
  Filled 2024-01-04: qty 1

## 2024-01-04 MED ORDER — SODIUM CHLORIDE 0.9 % IV SOLN: INTRAVENOUS | Status: DC

## 2024-01-04 MED ORDER — LABETALOL HCL 5 MG/ML IV SOLN: 20.0000 mg | Freq: Once | INTRAVENOUS | Status: DC

## 2024-01-04 MED ORDER — GABAPENTIN 300 MG PO CAPS
600.0000 mg | ORAL_CAPSULE | Freq: Every day | ORAL | Status: DC
  Administered 2024-01-04 – 2024-01-05 (×2): 600 mg via ORAL
  Filled 2024-01-04 (×2): qty 2

## 2024-01-04 MED ORDER — INSULIN ASPART 100 UNIT/ML IJ SOLN
0.0000 [IU] | Freq: Three times a day (TID) | INTRAMUSCULAR | Status: DC
  Administered 2024-01-04: 5 [IU] via SUBCUTANEOUS
  Administered 2024-01-05: 11 [IU] via SUBCUTANEOUS
  Administered 2024-01-05: 3 [IU] via SUBCUTANEOUS

## 2024-01-04 MED ORDER — SODIUM CHLORIDE 0.9% FLUSH
3.0000 mL | Freq: Once | INTRAVENOUS | Status: AC
  Administered 2024-01-04: 3 mL via INTRAVENOUS

## 2024-01-04 NOTE — ED Triage Notes (Signed)
 Pt presents to ED via EMS from [facility] with sudden onset of left sided weakness, dizziness, and frontal headache. Headache rated 5/10. BG 222 with EMS. Alert and oriented x4. LKW 0815.

## 2024-01-04 NOTE — ED Provider Notes (Signed)
 [place] EMERGENCY DEPARTMENT AT [facility] Provider Note   CSN: [id number] Arrival date & time: 01/04/24  2130  An emergency department physician performed an initial assessment on this suspected stroke patient at 63.  History  Chief Complaint  Patient presents with   Code Stroke    BARREN GOLDSON is a 63 y.o. male.  HPI Patient presents with left-sided weakness.  Dull headache.  Dizziness.  Began acutely at 815.  Came in as a code stroke.  Met by Dr. Lindzen and myself [place] bridge.    Home Medications Prior to Admission medications   Medication Sig Start Date End Date Taking? Authorizing Provider  aspirin  EC 81 MG tablet Take 81 mg by mouth daily. Swallow whole.   Yes [provider]  atorvastatin  (LIPITOR ) 40 MG tablet TAKE 1 TABLET BY MOUTH EVERY MORNING 06/09/23  Yes Donnie Galea, MD  carvedilol  (COREG ) 3.125 MG tablet Take 1 tablet (3.125 mg total) by mouth 2 (two) times daily. 03/31/23  Yes Donnie Galea, MD  cyclobenzaprine  (FLEXERIL ) 5 MG tablet TAKE 1 TABLET BY MOUTH EVERYDAY AT BEDTIME 11/29/23  Yes Donnie Galea, MD  gabapentin  (NEURONTIN ) 300 MG capsule TAKE 1-2 CAPSULES BY MOUTH IN THE EVENING. MAY CAUSE SEDATION Patient taking differently: Take 600 mg by mouth at bedtime. 07/20/23  Yes Donnie Galea, MD  insulin  degludec (TRESIBA  FLEXTOUCH) 100 UNIT/ML FlexTouch Pen INJECT 40 UNITS SUBCUTANEOUSLY DAILY. Day supply 30. 11/12/23  Yes Donnie Galea, MD  losartan  (COZAAR ) 50 MG tablet Take 1 tablet (50 mg total) by mouth daily. 03/31/23  Yes Donnie Galea, MD  metFORMIN  (GLUCOPHAGE -XR) 500 MG 24 hr tablet TAKE 1 TABLET BY MOUTH 2 TIMES DAILY WITH A MEAL. 11/03/23  Yes Donnie Galea, MD  ondansetron  (ZOFRAN ) 4 MG tablet Take 4 mg by mouth daily as needed for nausea or vomiting. 02/09/20  Yes [provider]  oxyCODONE -acetaminophen  (PERCOCET/ROXICET) 5-325 MG tablet Take 0.5-1 tablets by mouth every 6 (six) hours as  needed for severe pain. 08/11/21  Yes [provider]  pantoprazole  (PROTONIX ) 40 MG tablet TAKE 1 TABLET BY MOUTH EVERY MORNING 05/12/23  Yes Donnie Galea, MD  predniSONE  (DELTASONE ) 10 MG tablet Take by mouth. Take as directed on package 12/30/23  Yes [provider]  Semaglutide , 1 MG/DOSE, (OZEMPIC , 1 MG/DOSE,) 4 MG/3ML SOPN INJECT 1 MG AS DIRECTED ONCE A WEEK. 12/27/23  Yes Donnie Galea, MD  tamsulosin  (FLOMAX ) 0.4 MG CAPS capsule Take 2 capsules (0.8 mg total) by mouth daily. Patient taking differently: Take 0.4 mg by mouth in the morning and at bedtime. 11/01/23  Yes Donnie Galea, MD  Continuous Glucose Sensor (FREESTYLE LIBRE 3 PLUS SENSOR) MISC Use to check blood sugar continuously. Change sensor every 15 days. 07/13/23   Donnie Galea, MD  nitroGLYCERIN  (NITROSTAT ) 0.4 MG SL tablet Place 1 tablet (0.4 mg total) under the tongue every 5 (five) minutes as needed for chest pain. Patient not taking: Reported on 01/04/2024 10/04/19   Druscilla Gerhard, NP      Allergies    Ambien [zolpidem], Lisinopril, Metformin  and related, and Tramadol     Review of Systems   Review of Systems  Physical Exam Updated Vital Signs BP 139/71   Pulse 70   Temp (!) 97.5 F (36.4 C) (Oral)   Resp 17   Ht 5\' 10"  (1.778 m)   Wt 92.1 kg   SpO2 98%   BMI 29.13 kg/m  Physical Exam Vitals and nursing note reviewed.  HENT:     Head: Atraumatic.  Eyes:     Extraocular Movements: Extraocular movements intact.  Abdominal:     Tenderness: There is no abdominal tenderness.  Musculoskeletal:        General: No tenderness.     Cervical back: Neck supple.  Neurological:     Mental Status: He is alert and oriented to person, place, and time.     Comments: Complete NIH scoring done by neurology.  Eye movements intact.  Finger-nose off on the left.  Does have some slurred speech.  Mild facial droop on the left.  Initially had difficulty raising left leg but that has waxed and waned  somewhat.     ED Results / Procedures / Treatments   Labs (all labs ordered are listed, but only abnormal results are displayed) Labs Reviewed  APTT - Abnormal; Notable for the following components:      Result Value   aPTT 23 (*)    All other components within normal limits  COMPREHENSIVE METABOLIC PANEL WITH GFR - Abnormal; Notable for the following components:   CO2 20 (*)    Glucose, Bld 237 (*)    BUN 30 (*)    Creatinine, Ser 1.45 (*)    GFR, Estimated 54 (*)    All other components within normal limits  GLUCOSE, CAPILLARY - Abnormal; Notable for the following components:   Glucose-Capillary 234 (*)    All other components within normal limits  I-STAT CHEM 8, ED - Abnormal; Notable for the following components:   BUN 31 (*)    Creatinine, Ser 1.40 (*)    Glucose, Bld 238 (*)    Calcium , Ion 1.12 (*)    TCO2 21 (*)    All other components within normal limits  CBG MONITORING, ED - Abnormal; Notable for the following components:   Glucose-Capillary 232 (*)    All other components within normal limits  MRSA NEXT GEN BY PCR, NASAL  PROTIME-INR  CBC  DIFFERENTIAL  ETHANOL  HIV ANTIBODY (ROUTINE TESTING W REFLEX)    EKG None  Radiology CT ANGIO HEAD NECK W WO CM (CODE STROKE) Result Date: 01/04/2024 CLINICAL DATA:  63 year old male code stroke. Status post T NK. Cerebellar symptoms. EXAM: CT ANGIOGRAPHY HEAD AND NECK TECHNIQUE: Multidetector CT imaging of the head and neck was performed using the standard protocol during bolus administration of intravenous contrast. Multiplanar CT image reconstructions and MIPs were obtained to evaluate the vascular anatomy. Carotid stenosis measurements (when applicable) are obtained utilizing NASCET criteria, using the distal internal carotid diameter as the denominator. RADIATION DOSE REDUCTION: This exam was performed according to the departmental dose-optimization program which includes automated exposure control, adjustment of the  mA and/or kV according to patient size and/or use of iterative reconstruction technique. CONTRAST:  75 mL Omnipaque 350 COMPARISON:  Head CT 0905 hours today. Cervical spine MRI 10/19/2020.  Brain MRI 07/29/2020. FINDINGS: CTA NECK Skeleton: Previous cervical ACDF C4 through C7. ACDF hardware C4-C5. Solid appearing arthrodesis elsewhere. No acute osseous abnormality identified. Upper chest: Negative. Other neck: Negative. Aortic arch: -3 vessel arch.  Mild great vessel tortuosity. Right carotid system: Mild brachiocephalic and right CCA origin tortuosity. Mild soft plaque at the right ICA origin. No significant stenosis to the skull base. Left carotid system: Mild tortuosity. Mild to moderate soft and calcified plaque at the left ICA origin and bulb. No significant stenosis. Vertebral arteries: Proximal right subclavian artery appears normal. Minimal  calcified plaque near the right vertebral origin with no convincing origin stenosis. Patent right vertebral artery to the skull base with no other significant plaque or stenosis. Proximal left subclavian, left innominate venous contrast bolus streak artifact. Proximal subclavian artery is patent. Left vertebral artery origin is poorly visible, and there seems to be absent enhancement of the left vertebral V1 segment, seemingly reconstituted in the proximal V2 segment at the C6-C7 level on series 6, image 318. The left vertebral artery than is patent and symmetrically enhancing to the skull base with no additional plaque or stenosis. The left vertebral artery V1 flow void seemed normal on the 2022 MRI. CTA HEAD Posterior circulation: Right vertebral artery V4 segment calcified plaque with stenosis on series 6, image 197 such that the right V4 segment distal to the patent right PICA is diminutive. This is stable compared to brain MRI flow voids in 2021. Distal left vertebral artery is dominant, supplies the basilar without stenosis. Left PICA origin remains patent. Patent  basilar artery without stenosis. Patent SCA and PCA origins. Posterior communicating arteries are diminutive or absent. Bilateral PCA branches are within normal limits. Anterior circulation: Both ICA siphons are patent. Minimal left siphon plaque and no stenosis. Mild right siphon supraclinoid calcified plaque without stenosis. Patent carotid termini, MCA and ACA origins. Anterior communicating artery may be mildly fenestrated, normal variant. Bilateral ACA branches are within normal limits. Left MCA M1 segment and bifurcation are patent without stenosis. Right MCA M1 segment and bifurcation are patent without stenosis. Bilateral MCA branches are within normal limits. Venous sinuses: Patent. Anatomic variants: Functionally dominant left vertebral artery with chronically diminutive right V4 distal to PICA. Review of the MIP images confirms the above findings IMPRESSION: 1. 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 Reconstituted proximal V2 segment, and left vertebral otherwise patent to the vertebrobasilar junction. The left vertebral is dominant and primarily supplies the basilar (#2). 2. No other large vessel occlusion. Minimal carotid atherosclerosis for age. 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 3. Chronic cervical ACDF. Salient findings discussed by telephone with Dr. Kimberley Penman on 01/04/2024 at 09:38 . Electronically Signed   By: Marlise Simpers M.D.   On: 01/04/2024 09:47   CT HEAD CODE STROKE WO CONTRAST Result Date: 01/04/2024 CLINICAL DATA:  Code stroke. 63 year old male with left side weakness. EXAM: CT HEAD WITHOUT CONTRAST TECHNIQUE: Contiguous axial images were obtained from the base of the skull through the vertex without intravenous contrast. RADIATION DOSE REDUCTION: This exam was performed according to the departmental dose-optimization  program which includes automated exposure control, adjustment of the mA and/or kV according to patient size and/or use of iterative reconstruction technique. COMPARISON:  Brain MRI 07/29/2020.  Head CT 11/17/2019. FINDINGS: Brain: Cerebral volume remains normal for age. No midline shift, ventriculomegaly, mass effect, evidence of mass lesion, intracranial hemorrhage or evidence of cortically based acute infarction. Gray-white matter differentiation is within normal limits throughout the brain. Vascular: No suspicious intracranial vascular hyperdensity. Skull: Stable, intact. Sinuses/Orbits: Visualized paranasal sinuses and mastoids are stable and well aerated. Other: No gaze deviation, acute orbit or scalp soft tissue finding. ASPECTS [facility] Stroke Program Early CT Score) Total score (0-10 with 10 being normal): 10 IMPRESSION: Stable and normal for age noncontrast CT appearance of the brain. ASPECTS 10. These results were communicated to Dr. Lindzen at 9:12 am on 01/04/2024 by  text page via the [facility] messaging system. Electronically Signed   By: Marlise Simpers M.D.   On: 01/04/2024 09:12    Procedures Procedures    Medications Ordered in ED Medications   stroke: early stages of recovery book (has no administration in time range)  0.9 %  sodium chloride  infusion ( Intravenous Infusion Verify 01/04/24 1600)  acetaminophen  (TYLENOL ) tablet 650 mg (has no administration in time range)    Or  acetaminophen  (TYLENOL ) 160 MG/5ML solution 650 mg (has no administration in time range)    Or  acetaminophen  (TYLENOL ) suppository 650 mg (has no administration in time range)  senna-docusate (Senokot-S) tablet 1 tablet (has no administration in time range)  pantoprazole  (PROTONIX ) injection 40 mg (has no administration in time range)  labetalol  (NORMODYNE ) injection 20 mg (20 mg Intravenous Not Given 01/04/24 1053)    And  nicardipine (CARDENE) 20mg  in 0.86% saline 200ml IV infusion (0.1 mg/ml) (0 mg/hr Intravenous  Hold 01/04/24 1056)  Chlorhexidine  Gluconate Cloth 2 % PADS 6 each (6 each Topical Given 01/04/24 1035)  insulin  aspart (novoLOG ) injection 0-15 Units (has no administration in time range)  Oral care mouth rinse (has no administration in time range)  atorvastatin  (LIPITOR ) tablet 40 mg (has no administration in time range)  carvedilol  (COREG ) tablet 3.125 mg (has no administration in time range)  cyclobenzaprine  (FLEXERIL ) tablet 5 mg (has no administration in time range)  gabapentin  (NEURONTIN ) capsule 600 mg (has no administration in time range)  oxyCODONE -acetaminophen  (PERCOCET/ROXICET) 5-325 MG per tablet 0.5-1 tablet (has no administration in time range)  sodium chloride  flush (NS) 0.9 % injection 3 mL (3 mLs Intravenous Given 01/04/24 0920)  tenecteplase (TNKASE) injection for Stroke 24 mg (24 mg Intravenous Given 01/04/24 0920)  iohexol (OMNIPAQUE) 350 MG/ML injection 75 mL (75 mLs Intravenous Contrast Given 01/04/24 8119)    ED Course/ Medical Decision Making/ A&P                                 Medical Decision Making Amount and/or Complexity of Data Reviewed Labs: ordered. Radiology: ordered.  Risk Decision regarding hospitalization.   Patient came in with neurodeficits.  Differential diagnosis does include stroke.  Does have more cerebellar posterior findings on the left side.  Patient was unable to stand up and walk due to the symptoms.  Initial head CT independently interpreted showed no bleeding.  After a somewhat prolonged decision making process due to waxing and waning symptoms decision made for TNK.  Admit to ICU.  CRITICAL CARE Performed by: Mozell Arias Total critical care time: 30 minutes Critical care time was exclusive of separately billable procedures and treating other patients. Critical care was necessary to treat or prevent imminent or life-threatening deterioration. Critical care was time spent personally by me on the following activities: development of  treatment plan with patient and/or surrogate as well as nursing, discussions with consultants, evaluation of patient's response to treatment, examination of patient, obtaining history from patient or surrogate, ordering and performing treatments and interventions, ordering and review of laboratory studies, ordering and review of radiographic studies, pulse oximetry and re-evaluation of patient's condition.         Final Clinical Impression(s) / ED Diagnoses Final diagnoses:  Cerebrovascular accident (CVA), unspecified mechanism [facility])    Rx / DC Orders ED Discharge Orders     None         Mozell Arias, MD 01/04/24 1639

## 2024-01-04 NOTE — Code Documentation (Addendum)
 Stroke Response Nurse Documentation Code Documentation  Tony Manning is a 63 y.o. male arriving to [facility]  via [facility] on 01/04/2024 with past medical hx of heart attack, diabetes. On aspirin  81 mg daily. Code stroke was activated by EMS.   Patient from work where he was LKW at [phone] and now complaining of dizziness, left sided weakness.   Stroke team at the bedside on patient arrival. Labs drawn and patient cleared for CT by Dr. Val Garin. Patient to CT with team. NIHSS 6, see documentation for details and code stroke times. Patient with left facial droop, left arm weakness, left leg weakness, left limb ataxia, and dysarthria  on exam. The following imaging was completed:  CT Head and CTA. Patient is a candidate for IV Thrombolytic due to fixed neurological deficit. Patient is not a candidate for IR due to unfavorable risk/benefit analysis per neurologist and radiologist on imaging..   Care Plan: q 15 NIHSS and VS x 2 hr,                   Q 30 min NIHSS and VS x 6 hr,                    Q 1 hr NIHSS and VS x 16 hr.     Bedside handoff with ED RN Tony Manning.    Tony Manning  Stroke Response RN

## 2024-01-04 NOTE — Progress Notes (Signed)
 PHARMACIST CODE STROKE RESPONSE  Notified to mix TNK at 09:17 by Dr. Renaee Caro TNK preparation completed at 09:20  TNK dose = 24 mg IV over 5 seconds  Issues/delays encountered (if applicable): n/a  Volney Grumbles, PharmD PGY-1 Acute Care Pharmacy Resident 01/04/2024 9:21 AM

## 2024-01-04 NOTE — Plan of Care (Signed)
  Problem: Education: Goal: Knowledge of disease or condition will improve Outcome: Progressing   Problem: Ischemic Stroke/TIA Tissue Perfusion: Goal: Complications of ischemic stroke/TIA will be minimized Outcome: Progressing   Problem: Coping: Goal: Will verbalize positive feelings about self Outcome: Progressing   Problem: Coping: Goal: Will identify appropriate support needs Outcome: Progressing   Problem: Self-Care: Goal: Ability to participate in self-care as condition permits will improve Outcome: Progressing   Problem: Self-Care: Goal: Ability to communicate needs accurately will improve Outcome: Progressing   Problem: Self-Care: Goal: Verbalization of feelings and concerns over difficulty with self-care will improve Outcome: Progressing

## 2024-01-04 NOTE — H&P (Signed)
 NEUROLOGY H&P NOTE   Date of service: January 04, 2024 Patient Name: Tony Manning MRN:  [id number] DOB:  Nov 22, 1960 Chief Complaint: "Dizziness"  History of Present Illness  Tony Manning is a 63 y.o. male with PMHx of anginal pain, basal cell carcinoma of the face, CKD, CAD s/p stenting x 5, depression, DM, HTN, heart murmur, nephrolithiasis, HLD, migraine, sarcoidosis, sleep apnea (on CPAP) and prior cervical discectomy with fusion who presents to the ED via EMS as a Code Stroke after acute onset of chest pain, dizziness and left sided weakness. BP per EMS was 160/78.   Home medications include ASA and Lipitor .   Last known well: 0815 Modified rankin score: 0-Completely asymptomatic and back to baseline post- stroke IV Thrombolysis: Yes Thrombectomy: No: Not felt to be a good candidate for VIR after discussion with Radiology, due to high risk of procedure involving posterior circulation, in the context of relatively low benefit compared to the risk given the deficits documented below.   NIHSS components Score: Comment  1a Level of Conscious 0[x]  1[]  2[]  3[]      1b LOC Questions 0[x]  1[]  2[]       1c LOC Commands 0[x]  1[]  2[]       2 Best Gaze 0[x]  1[]  2[]      Vertical nystagmus in both eyes is elicited with leftward gaze  3 Visual 0[x]  1[]  2[]  3[]      4 Facial Palsy 0[x]  1[]  2[]  3[]      5a Motor Arm - left 0[]  1[x]  2[]  3[]  4[]  UN[]   Slight drift  5b Motor Arm - Right 0[x]  1[]  2[]  3[]  4[]  UN[]    6a Motor Leg - Left 0[]  1[x]  2[]  3[]  4[]  UN[]    6b Motor Leg - Right 0[x]  1[]  2[]  3[]  4[]  UN[]    7 Limb Ataxia 0[]  1[]  2[x]  UN[]     LUE abnormal FNF and RAM. LLE with dysmetria.   8 Sensory 0[]  1[x]  2[]  UN[]     Decreased sensation to left face and LLE  9 Best Language 0[x]  1[]  2[]  3[]      10 Dysarthria 0[]  1[x]  2[]  UN[]      11 Extinct. and Inattention 0[x]  1[]  2[]       TOTAL:   6      ROS  As per HPI. Detailed ROS deferred in the context of acuity of presentation.    Past  History   Past Medical History:  Diagnosis Date   Anginal pain (HCC)    Basal cell carcinoma of face    Chest pain, unspecified    Chronic kidney disease    kidney stones   Coronary atherosclerosis of native coronary artery    s/p stent x5   Depression    Diabetes mellitus without mention of complication    Diverticulosis    Dyspnea    Dysrhythmia    Erectile dysfunction    Essential hypertension, benign    Family history of adverse reaction to anesthesia    GERD (gastroesophageal reflux disease)    Heart murmur    History of kidney stones    History of nephrolithiasis    Hyperlipidemia    Migraine    Myocardial infarction (HCC)    2016   Neuromuscular disorder (HCC)    Osteoarthritis    Pneumonia    Postsurgical percutaneous transluminal coronary angioplasty status    s/p stent x4   Sarcoidosis    Sleep apnea    uses CPAP   Unspecified sleep apnea    uses C-pap  Past Surgical History:  Procedure Laterality Date   ANTERIOR CERVICAL DECOMP/DISCECTOMY FUSION N/A 04/07/2017   Procedure: ANTERIOR CERVICAL DECOMPRESSION FUSION, CERVICAL 4-5 WITH INSTRUMENTATION AND ALLOGRAFT;  Surgeon: Virl Grimes, MD;  Location: [facility] OR;  Service: Orthopedics;  Laterality: N/A;  ANTERIOR CERVICAL DECOMPRESSION FUSION, CERVICAL 4-5 WITH INSTRUMENTATION AND ALLOGRAFT; REQUEST 2.5 HOURS AND FLIP ROOM   APPENDECTOMY  2005   CARPAL TUNNEL RELEASE     CERVICAL FUSION  2009   COLONOSCOPY     CORONARY ANGIOPLASTY WITH STENT PLACEMENT     2004   CORONARY STENT INTERVENTION N/A 10/16/2016   Procedure: Coronary Stent Intervention;  Surgeon: Knox Perl, MD;  Location: [facility] INVASIVE CV LAB;  Service: Cardiovascular;  Laterality: N/A;   CYSTOSCOPY WITH RETROGRADE PYELOGRAM, URETEROSCOPY AND STENT PLACEMENT Right 05/12/2019   Procedure: CYSTOSCOPY WITH RIGHT RETROGRADE PYELOGRAM, URETEROSCOPY HOLMIUM LASER AND STENT PLACEMENT;  Surgeon: Samson Croak, MD;  Location: [facility] ORS;  Service: Urology;   Laterality: Right;   ELBOW SURGERY     bilateral   HAND SURGERY Left    KNEE SURGERY Bilateral    Bilateral    LEFT HEART CATH AND CORONARY ANGIOGRAPHY N/A 10/16/2016   Procedure: Left Heart Cath and Coronary Angiography;  Surgeon: Knox Perl, MD;  Location: [facility] INVASIVE CV LAB;  Service: Cardiovascular;  Laterality: N/A;   LEFT HEART CATHETERIZATION WITH CORONARY ANGIOGRAM N/A 06/14/2014   Procedure: LEFT HEART CATHETERIZATION WITH CORONARY ANGIOGRAM;  Surgeon: Jessica Morn, MD;  Location: [facility] CATH LAB;  Service: Cardiovascular;  Laterality: N/A;   LUNG BIOPSY     sarcoid   ROTATOR CUFF REPAIR     left   SHOULDER ARTHROSCOPY WITH SUBACROMIAL DECOMPRESSION Left 06/16/2018   Procedure: LEFT SHOULDER ARTHROSCOPY WITH ROTATOR CUFF DEBRIDEMENT AND  SUBACROMIAL DECOMPRESSION;  Surgeon: Sammye Cristal, MD;  Location: [facility] OR;  Service: Orthopedics;  Laterality: Left;   Family History  Problem Relation Age of Onset   Arthritis Mother    Depression Mother    Stroke Mother    Arthritis Father    Diabetes Father    Coronary artery disease Father    Colon cancer Neg Hx    Prostate cancer Neg Hx    Esophageal cancer Neg Hx    Rectal cancer Neg Hx    Stomach cancer Neg Hx    Social History   Socioeconomic History   Marital status: Married    Spouse name: Not on file   Number of children: 2   Years of education: Not on file   Highest education level: Not on file  Occupational History   Occupation: disabled    Employer: unemployed  Tobacco Use   Smoking status: Never   Smokeless tobacco: Former    Types: Chew    Quit date: 02/06/2016  Vaping Use   Vaping status: Never Used  Substance and Sexual Activity   Alcohol use: No    Alcohol/week: 0.0 standard drinks of alcohol   Drug use: No   Sexual activity: Yes    Birth control/protection: Condom  Other Topics Concern   Not on file  Social History Narrative   Married, [phone] kids   On disability   Social Drivers of Health    Financial Resource Strain: Low Risk  (11/04/2022)   Overall Financial Resource Strain (CARDIA)    Difficulty of Paying Living Expenses: Not hard at all  Food Insecurity: No Food Insecurity (12/20/2023)   Hunger Vital Sign    Worried About Running Out of  Food in the Last Year: Never true    Ran Out of Food in the Last Year: Never true  Transportation Needs: No Transportation Needs (12/20/2023)   PRAPARE - Administrator, Civil Service (Medical): No    Lack of Transportation (Non-Medical): No  Physical Activity: Insufficiently Active (11/04/2022)   Exercise Vital Sign    Days of Exercise per Week: 5 days    Minutes of Exercise per Session: 20 min  Stress: No Stress Concern Present (11/04/2022)   [employer] of Occupational Health - Occupational Stress Questionnaire    Feeling of Stress : Not at all  Social Connections: Moderately Isolated (11/04/2022)   Social Connection and Isolation Panel [NHANES]    Frequency of Communication with Friends and Family: Three times a week    Frequency of Social Gatherings with Friends and Family: More than three times a week    Attends Religious Services: Never    Database administrator or Organizations: No    Attends Engineer, structural: Never    Marital Status: Married   Allergies  Allergen Reactions   Ambien [Zolpidem] Other (See Comments)    Parasomnias, sleep walking   Lisinopril Cough   Metformin  And Related     GI upset with immediate release form   Tramadol  Diarrhea    Medications   Current Facility-Administered Medications:    sodium chloride  flush (NS) 0.9 % injection 3 mL, 3 mL, Intravenous, Once, Steinl, Kevin, MD   tenecteplase [facility]) injection for Stroke 24 mg, 0.25 mg/kg, Intravenous, Once, Kimberley Penman, MD  Current Outpatient Medications:    aspirin  EC 81 MG tablet, Take 81 mg by mouth daily. Swallow whole., Disp: , Rfl:    atorvastatin  (LIPITOR ) 40 MG tablet, TAKE 1 TABLET BY MOUTH EVERY MORNING,  Disp: 30 tablet, Rfl: 10   carvedilol  (COREG ) 3.125 MG tablet, Take 1 tablet (3.125 mg total) by mouth 2 (two) times daily., Disp: 180 tablet, Rfl: 3   Continuous Glucose Sensor (FREESTYLE LIBRE 3 PLUS SENSOR) MISC, Use to check blood sugar continuously. Change sensor every 15 days., Disp: 6 each, Rfl: 1   cyclobenzaprine  (FLEXERIL ) 5 MG tablet, TAKE 1 TABLET BY MOUTH EVERYDAY AT BEDTIME, Disp: 30 tablet, Rfl: 1   diclofenac  Sodium (VOLTAREN ) 1 % GEL, Apply 2-4 g topically 4 (four) times daily., Disp: 100 g, Rfl: 5   gabapentin  (NEURONTIN ) 300 MG capsule, TAKE 1-2 CAPSULES BY MOUTH IN THE EVENING. MAY CAUSE SEDATION, Disp: 180 capsule, Rfl: 1   insulin  degludec (TRESIBA  FLEXTOUCH) 100 UNIT/ML FlexTouch Pen, INJECT 40 UNITS SUBCUTANEOUSLY DAILY. Day supply 30., Disp: , Rfl:    losartan  (COZAAR ) 50 MG tablet, Take 1 tablet (50 mg total) by mouth daily., Disp: 90 tablet, Rfl: 3   metFORMIN  (GLUCOPHAGE -XR) 500 MG 24 hr tablet, TAKE 1 TABLET BY MOUTH 2 TIMES DAILY WITH A MEAL., Disp: 90 tablet, Rfl: 1   Na Sulfate-K Sulfate-Mg Sulf 17.5-3.13-1.6 GM/177ML SOLN, Use as directed; may use generic; goodrx card if insurance will not cover generic, Disp: 354 mL, Rfl: 0   nitroGLYCERIN  (NITROSTAT ) 0.4 MG SL tablet, Place 1 tablet (0.4 mg total) under the tongue every 5 (five) minutes as needed for chest pain., Disp: 25 tablet, Rfl: 3   ondansetron  (ZOFRAN ) 4 MG tablet, Take 4 mg by mouth every 8 (eight) hours as needed for nausea or vomiting., Disp: , Rfl:    oxyCODONE -acetaminophen  (PERCOCET/ROXICET) 5-325 MG tablet, Take 0.5-1 tablets by mouth every 6 (six) hours as needed  for severe pain., Disp: , Rfl:    pantoprazole  (PROTONIX ) 40 MG tablet, TAKE 1 TABLET BY MOUTH EVERY MORNING, Disp: 30 tablet, Rfl: 10   Semaglutide , 1 MG/DOSE, (OZEMPIC , 1 MG/DOSE,) 4 MG/3ML SOPN, INJECT 1 MG AS DIRECTED ONCE A WEEK., Disp: 3 mL, Rfl: 1   tamsulosin  (FLOMAX ) 0.4 MG CAPS capsule, Take 2 capsules (0.8 mg total) by mouth daily.,  Disp: 180 capsule, Rfl: 3   Vitals   Vitals:   01/04/24 0900  Weight: 94.2 kg     Body mass index is 29.8 kg/m.  Physical Exam   Constitutional: Appears well-developed and well-nourished.  Psych: Anxious affect is appropriate to situation.  Eyes: No scleral injection.  HENT: No OP obstruction.  Head: Normocephalic.  Respiratory: Effort normal, non-labored breathing.    Neurologic Examination   See NIHSS  Labs   CBC:  Recent Labs  Lab 01/04/24 0857 01/04/24 0902  WBC 10.3  --   NEUTROABS 7.6  --   HGB 15.1 14.3  HCT 43.6 42.0  MCV 88.8  --   PLT 221  --    Basic Metabolic Panel:  Lab Results  Component Value Date   NA 139 01/04/2024   K 3.5 01/04/2024   CO2 23 11/12/2023   GLUCOSE 238 (H) 01/04/2024   BUN 31 (H) 01/04/2024   CREATININE 1.40 (H) 01/04/2024   CALCIUM  9.3 11/12/2023   GFRNONAA 53 (L) 03/01/2023   GFRAA 42 (L) 05/05/2019   Lipid Panel:  Lab Results  Component Value Date   LDLCALC 44 09/11/2022   HgbA1c:  Lab Results  Component Value Date   HGBA1C 8.4 (H) 11/12/2023   Urine Drug Screen: No results found for: "LABOPIA", "COCAINSCRNUR", "LABBENZ", "AMPHETMU", "THCU", "LABBARB"  Alcohol Level No results found for: "ETH" INR  Lab Results  Component Value Date   INR 1.1 01/04/2024   APTT  Lab Results  Component Value Date   APTT 23 (L) 01/04/2024       Assessment  63 y.o. male with PMHx of anginal pain, basal cell carcinoma of the face, CKD, CAD s/p stenting x 5, depression, DM, HTN, heart murmur, nephrolithiasis, HLD, migraine, sarcoidosis, sleep apnea (on CPAP) and prior cervical discectomy with fusion who presents to the ED via EMS as a Code Stroke after acute onset of chest pain, dizziness and left sided weakness. BP per EMS was 160/78.  - Exam reveals left hemiataxia, mild left sided weakness and dysarthria. On standing, he has marked truncal ataxia.  - CT head: Stable and normal for age noncontrast CT appearance of the  brain. ASPECTS 10. - CTA of head and neck: Streak artifact at the left thoracic inlet but strong evidence of occluded Left Vertebral Artery Origin. Reconstituted proximal V2 segment, and left vertebral otherwise patent to the vertebrobasilar junction. The left vertebral is dominant and primarily supplies the basilar. No other large vessel occlusion. Minimal carotid atherosclerosis for age. 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 Chronic cervical ACDF - Labs: Elevated BUN and Cr with eGFR 54. Na, Ca and K normal. CBC normal. PT normal. PTT slightly low at 23. HIV nonreactive. EtOH negative.  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  - Classifiable as having failed ASA monotherapy.  - After comprehensive review of possible contraindications, she has no absolute contraindications to TNK administration. - Patient is a TNK candidate. Discussed extensively the risks/benefits of TNK treatment vs. no  treatment with the patient, including risks of hemorrhage and death with TNK administration versus worse overall outcomes on average in patients within the IV thrombolytic time window who are not administered TNK. Overall benefits of TNK regarding long-term prognosis are felt to outweigh risks. The patient expressed understanding and wish to proceed with TNK.  - Not felt to be a good candidate for VIR after discussion with Radiology, due to reconstituted flow distal to the occlusion and high risk of procedure involving the posterior circulation, in the context of relatively low benefit compared to the risk given the deficits documented in the NIHSS above.    Plan  1. Admitting to Neuro ICU.  2. Post-TNK order set to include frequent neuro checks and BP management.  3. No antiplatelet medications or anticoagulants for at least 24 hours following TNK.  4. DVT prophylaxis with SCDs.  5. Continue  atorvastatin .  6. Will need escalation of antiplatelet therapy to DAPT if follow up CT at 24 hours is negative for hemorrhagic conversion.  7. Fasting lipid panel, HgbA1c  8. TTE.   9. MRI brain  10. PT/OT/Speech.  11. NPO until passes swallow evaluation.  12. Sliding scale insulin .  13. Telemetry monitoring  60 minutes spent in the emergent neurological evaluation and management of this critically ill patient ______________________________________________________________________   Hope Ly, Orit Sanville, MD [facility]

## 2024-01-05 ENCOUNTER — Inpatient Hospital Stay (HOSPITAL_COMMUNITY)

## 2024-01-05 DIAGNOSIS — I639 Cerebral infarction, unspecified: Secondary | ICD-10-CM

## 2024-01-05 DIAGNOSIS — I6389 Other cerebral infarction: Secondary | ICD-10-CM | POA: Diagnosis not present

## 2024-01-05 DIAGNOSIS — E785 Hyperlipidemia, unspecified: Secondary | ICD-10-CM

## 2024-01-05 DIAGNOSIS — I129 Hypertensive chronic kidney disease with stage 1 through stage 4 chronic kidney disease, or unspecified chronic kidney disease: Secondary | ICD-10-CM

## 2024-01-05 DIAGNOSIS — R29700 NIHSS score 0: Secondary | ICD-10-CM | POA: Diagnosis not present

## 2024-01-05 DIAGNOSIS — Z7985 Long-term (current) use of injectable non-insulin antidiabetic drugs: Secondary | ICD-10-CM

## 2024-01-05 DIAGNOSIS — E1122 Type 2 diabetes mellitus with diabetic chronic kidney disease: Secondary | ICD-10-CM

## 2024-01-05 DIAGNOSIS — N1831 Chronic kidney disease, stage 3a: Secondary | ICD-10-CM

## 2024-01-05 DIAGNOSIS — Z794 Long term (current) use of insulin: Secondary | ICD-10-CM

## 2024-01-05 LAB — LIPID PANEL
Cholesterol: 109 mg/dL (ref 0–200)
HDL: 42 mg/dL (ref >40)
LDL Cholesterol: 41 mg/dL (ref 0–99)
Total CHOL/HDL Ratio: 2.6 ratio
Triglycerides: 129 mg/dL (ref <150)
VLDL: 26 mg/dL (ref 0–40)

## 2024-01-05 LAB — ECHOCARDIOGRAM COMPLETE
AR max vel: 2.19 cm2
AV Peak grad: 12.8 mmHg
Ao pk vel: 1.79 m/s
Area-P 1/2: 3.05 cm2
Height: 70 in
MV VTI: 2.11 cm2
S' Lateral: 2.9 cm
Weight: 3248 [oz_av]

## 2024-01-05 LAB — CBC
HCT: 44 % (ref 39.0–52.0)
Hemoglobin: 15.1 g/dL (ref 13.0–17.0)
MCH: 30.8 pg (ref 26.0–34.0)
MCHC: 34.3 g/dL (ref 30.0–36.0)
MCV: 89.6 fL (ref 80.0–100.0)
Platelets: 205 10*3/uL (ref 150–400)
RBC: 4.91 MIL/uL (ref 4.22–5.81)
RDW: 12.9 % (ref 11.5–15.5)
WBC: 11.8 10*3/uL — ABNORMAL HIGH (ref 4.0–10.5)
nRBC: 0 % (ref 0.0–0.2)

## 2024-01-05 LAB — RAPID URINE DRUG SCREEN, HOSP PERFORMED
Amphetamines: NOT DETECTED
Barbiturates: NOT DETECTED
Benzodiazepines: NOT DETECTED
Cocaine: NOT DETECTED
Opiates: NOT DETECTED
Tetrahydrocannabinol: NOT DETECTED

## 2024-01-05 LAB — BASIC METABOLIC PANEL WITH GFR
Anion gap: 12 (ref 5–15)
BUN: 25 mg/dL — ABNORMAL HIGH (ref 8–23)
CO2: 21 mmol/L — ABNORMAL LOW (ref 22–32)
Calcium: 8.9 mg/dL (ref 8.9–10.3)
Chloride: 104 mmol/L (ref 98–111)
Creatinine, Ser: 1.38 mg/dL — ABNORMAL HIGH (ref 0.61–1.24)
GFR, Estimated: 57 mL/min — ABNORMAL LOW (ref >60)
Glucose, Bld: 290 mg/dL — ABNORMAL HIGH (ref 70–99)
Potassium: 3.4 mmol/L — ABNORMAL LOW (ref 3.5–5.1)
Sodium: 137 mmol/L (ref 135–145)

## 2024-01-05 LAB — GLUCOSE, CAPILLARY
Glucose-Capillary: 196 mg/dL — ABNORMAL HIGH (ref 70–99)
Glucose-Capillary: 324 mg/dL — ABNORMAL HIGH (ref 70–99)
Glucose-Capillary: 120 mg/dL — ABNORMAL HIGH (ref 70–99)
Glucose-Capillary: 233 mg/dL — ABNORMAL HIGH (ref 70–99)

## 2024-01-05 MED ORDER — INSULIN ASPART 100 UNIT/ML IJ SOLN: 0.0000 [IU] | Freq: Three times a day (TID) | INTRAMUSCULAR | Status: DC

## 2024-01-05 MED ORDER — INSULIN ASPART 100 UNIT/ML IJ SOLN: 0.0000 [IU] | Freq: Every day | INTRAMUSCULAR | Status: DC

## 2024-01-05 MED ORDER — STUDY - LIBREXIA-STROKE - MILVEXIAN 25 MG OR PLACEBO TABLET (PI-SETHI)
1.0000 | ORAL_TABLET | Freq: Two times a day (BID) | ORAL | Status: DC
  Administered 2024-01-05 – 2024-01-06 (×2): 1 via ORAL
  Filled 2024-01-05 (×3): qty 1

## 2024-01-05 MED ORDER — INSULIN ASPART 100 UNIT/ML IJ SOLN
0.0000 [IU] | Freq: Three times a day (TID) | INTRAMUSCULAR | Status: DC
  Administered 2024-01-06: 3 [IU] via SUBCUTANEOUS
  Administered 2024-01-06: 2 [IU] via SUBCUTANEOUS

## 2024-01-05 MED ORDER — INSULIN GLARGINE-YFGN 100 UNIT/ML ~~LOC~~ SOLN
20.0000 [IU] | Freq: Every day | SUBCUTANEOUS | Status: DC
  Administered 2024-01-05 – 2024-01-06 (×2): 20 [IU] via SUBCUTANEOUS
  Filled 2024-01-05 (×2): qty 0.2

## 2024-01-05 MED ORDER — CLOPIDOGREL BISULFATE 75 MG PO TABS
75.0000 mg | ORAL_TABLET | Freq: Every day | ORAL | Status: DC
  Administered 2024-01-05 – 2024-01-06 (×2): 75 mg via ORAL
  Filled 2024-01-05 (×2): qty 1

## 2024-01-05 MED ORDER — PANTOPRAZOLE SODIUM 40 MG PO TBEC
40.0000 mg | DELAYED_RELEASE_TABLET | Freq: Every day | ORAL | Status: DC
  Administered 2024-01-05 – 2024-01-06 (×2): 40 mg via ORAL
  Filled 2024-01-05 (×2): qty 1

## 2024-01-05 MED ORDER — TAMSULOSIN HCL 0.4 MG PO CAPS
0.4000 mg | ORAL_CAPSULE | Freq: Two times a day (BID) | ORAL | Status: DC
  Administered 2024-01-05 – 2024-01-06 (×2): 0.4 mg via ORAL
  Filled 2024-01-05 (×2): qty 1

## 2024-01-05 MED ORDER — ENOXAPARIN SODIUM 40 MG/0.4ML IJ SOSY
40.0000 mg | PREFILLED_SYRINGE | INTRAMUSCULAR | Status: DC
  Administered 2024-01-05: 40 mg via SUBCUTANEOUS
  Filled 2024-01-05: qty 0.4

## 2024-01-05 MED ORDER — POTASSIUM CHLORIDE CRYS ER 20 MEQ PO TBCR
40.0000 meq | EXTENDED_RELEASE_TABLET | Freq: Once | ORAL | Status: AC
  Administered 2024-01-05: 40 meq via ORAL
  Filled 2024-01-05: qty 2

## 2024-01-05 MED ORDER — INSULIN ASPART 100 UNIT/ML IJ SOLN
0.0000 [IU] | Freq: Every day | INTRAMUSCULAR | Status: DC
  Administered 2024-01-05: 2 [IU] via SUBCUTANEOUS

## 2024-01-05 MED ORDER — STUDY - LIBREXIA-STROKE - MILVEXIAN 25 MG OR PLACEBO TABLET (PI-SETHI)
1.0000 | ORAL_TABLET | Freq: Two times a day (BID) | ORAL | Status: DC
  Filled 2024-01-05: qty 1

## 2024-01-05 MED ORDER — ASPIRIN 81 MG PO TBEC
81.0000 mg | DELAYED_RELEASE_TABLET | Freq: Every day | ORAL | Status: DC
  Administered 2024-01-05 – 2024-01-06 (×2): 81 mg via ORAL
  Filled 2024-01-05 (×2): qty 1

## 2024-01-05 NOTE — Evaluation (Signed)
 Physical Therapy Evaluation Patient Details Name: Tony Manning MRN: [id number] DOB: [birth year] Today's Date: 01/05/2024  History of Present Illness  63 yo male presented 4/29 for dizziness and L side weakness. MRI (+) Patchy acute infarcts L cerebellum SCA territory with several punctate additional ischemic foci elsewhere  in the left cerebellar hemisphere and R occipital pole, suspected acute L vertebral artery origin occlusion. Recieved TNK on 4/29. PMH: MI, DM, basal cell carcinoma on face CKD CAD s/p stenting x5 depression HTN heart murmur, nephrolithiasis, HLD arcoidosis, sleep apnea (on CPAP), neck surgery fusion   Clinical Impression  Pt presents with condition above and deficits mentioned below, see PT Problem List. PTA, he was independent without DME, working, and living with his wife in a 3-level house (does not go up to 3rd level but has to go up to 2nd level from entry level basement access to home). The pt reports some balance issues when trying to put on pants in standing at baseline. Currently, he displays balance deficits when visual input is eliminated in narrow stances and suddenly intermittently when ambulating (seemed due to knee issues). He was able to recover during these LOB bouts through taking reactional steps though, not needing any external support/assistance. He also displayed some very mild L lower extremity weakness compared to his R side. He was provided CGA-supervision for safety only when ambulating and navigating stairs without UE support. He appears to be functioning near his baseline and likely will improve quickly, therefore do not anticipate a need for post acute PT but will follow acutely for likely just x1 more session if needed for further balance/strength training.       If plan is discharge home, recommend the following: A little help with walking and/or transfers;A little help with bathing/dressing/bathroom;Assistance with cooking/housework;Assist for  transportation   Can travel by private vehicle        Equipment Recommendations None recommended by PT  Recommendations for Other Services       Functional Status Assessment Patient has had a recent decline in their functional status and demonstrates the ability to make significant improvements in function in a reasonable and predictable amount of time.     Precautions / Restrictions Precautions Precautions: Fall (low risk) Restrictions Weight Bearing Restrictions Per Provider Order: No      Mobility  Bed Mobility Overal bed mobility: Modified Independent             General bed mobility comments: HOB elevated, no assistance transitioning supine to sit R EOB    Transfers Overall transfer level: Modified independent Equipment used: None               General transfer comment: Able to stand from EOB without LOB or assistance    Ambulation/Gait Ambulation/Gait assistance: Supervision Gait Distance (Feet): 340 Feet Assistive device: None Gait Pattern/deviations: Step-through pattern (staggers laterally intermittently) Gait velocity: WFL     General Gait Details: Pt ambulates the majority of the time steadily, but intermittently (x3 reps) he staggered laterally when it seemed a knee gave him issues. He was able to recover on his own without assistance each bout. Able to change head positions, directions, and speeds without LOB or assistance. Supervision for safety  Stairs Stairs: Yes Stairs assistance: Contact guard assist Stair Management: No rails, Alternating pattern, Forwards Number of Stairs: 10 General stair comments: Ascends and descends stairs without LOB or assistance, CGA for safety  Wheelchair Mobility     Tilt Bed    Modified Rankin (  Stroke Patients Only) Modified Rankin (Stroke Patients Only) Pre-Morbid Rankin Score: No symptoms Modified Rankin: No significant disability     Balance Overall balance assessment: Mild deficits observed,  not formally tested                                           Pertinent Vitals/Pain Pain Assessment Pain Assessment: Faces Faces Pain Scale: No hurt Pain Intervention(s): Monitored during session    Home Living Family/patient expects to be discharged to:: Private residence Living Arrangements: Spouse/significant other Available Help at Discharge: Family;Available 24 hours/day Type of Home: House Home Access: Level entry (to basement, they typically go this way)     Alternate Level Stairs-Number of Steps: 12 (has stair chair lift) Home Layout: Multi-level;Bed/bath upstairs (has to go up to 1st floor from basement entry to get to bedroom, does not go to 3rd level) Home Equipment: Rollator (4 wheels);Cane - single point;Crutches;Other (comment);BSC/3in1;Shower seat;Hand held shower head;Wheelchair - manual (stair chair lift)      Prior Function Prior Level of Function : Independent/Modified Independent;Working/employed;Driving             Mobility Comments: No AD ADLs Comments: Works driveline, Engineer, manufacturing displays at stores     Extremity/Trunk Assessment   Upper Extremity Assessment Upper Extremity Assessment: Defer to OT evaluation    Lower Extremity Assessment Lower Extremity Assessment: LLE deficits/detail LLE Deficits / Details: very mild weakness noted with hip flexion and quads 4+ (5 on R), anterior tibialis 5/5; coordination grossly intact; sensation intact    Cervical / Trunk Assessment Cervical / Trunk Assessment: Neck Surgery;Other exceptions (baseline back pain)  Communication   Communication Communication: No apparent difficulties    Cognition Arousal: Alert Behavior During Therapy: WFL for tasks assessed/performed   PT - Cognitive impairments: No apparent impairments                       PT - Cognition Comments: Able to dual task counting backwards by 3s from 30 while dual tasking with motor tasks, like turning head,  walking fast, suddenly stopping, and changing directions without mistake or LOB. Follows 2 step commands at least, reports he could not hear the rest of the 3rd step on x1 occurence. Following commands: Intact       Cueing Cueing Techniques: Verbal cues     General Comments General comments (skin integrity, edema, etc.): VSS on RA    Exercises     Assessment/Plan    PT Assessment Patient needs continued PT services  PT Problem List Decreased strength;Decreased balance;Decreased mobility       PT Treatment Interventions DME instruction;Gait training;Stair training;Functional mobility training;Therapeutic activities;Therapeutic exercise;Balance training;Neuromuscular re-education;Patient/family education    PT Goals (Current goals can be found in the Care Plan section)  Acute Rehab PT Goals Patient Stated Goal: to return to his normal PT Goal Formulation: With patient/family Time For Goal Achievement: 01/19/24 Potential to Achieve Goals: Good    Frequency Min 1X/week     Co-evaluation PT/OT/SLP Co-Evaluation/Treatment: Yes Reason for Co-Treatment: Necessary to address cognition/behavior during functional activity;For patient/therapist safety;To address functional/ADL transfers PT goals addressed during session: Mobility/safety with mobility;Balance OT goals addressed during session: Proper use of Adaptive equipment and DME;ADL's and self-care;Strengthening/ROM       AM-PAC PT "6 Clicks" Mobility  Outcome Measure Help needed turning from your back to your side while in a  flat bed without using bedrails?: None Help needed moving from lying on your back to sitting on the side of a flat bed without using bedrails?: None Help needed moving to and from a bed to a chair (including a wheelchair)?: None Help needed standing up from a chair using your arms (e.g., wheelchair or bedside chair)?: None Help needed to walk in hospital room?: A Little Help needed climbing 3-5 steps with  a railing? : A Little 6 Click Score: 22    End of Session Equipment Utilized During Treatment: Gait belt Activity Tolerance: Patient tolerated treatment well Patient left: in chair;with call bell/phone within reach;with chair alarm set;with family/visitor present Nurse Communication: Mobility status PT Visit Diagnosis: Unsteadiness on feet (R26.81);Muscle weakness (generalized) (M62.81);Other symptoms and signs involving the nervous system (R29.898)    Time: 1610-9604 PT Time Calculation (min) (ACUTE ONLY): 28 min   Charges:   PT Evaluation $PT Eval Moderate Complexity: 1 Mod   PT General Charges $$ ACUTE PT VISIT: 1 Visit         Vernida Goodie, PT, DPT Acute Rehabilitation Services  Office: [phone]   Ellyn Hack 01/05/2024, 1:04 PM

## 2024-01-05 NOTE — Progress Notes (Addendum)
 STROKE TEAM PROGRESS NOTE    SIGNIFICANT HOSPITAL EVENTS 4/29-patient admitted and given TNK to treat stroke  INTERIM HISTORY/SUBJECTIVE  Patient has remained hemodynamically stable and afebrile overnight.  MRI reveals patchy left cerebellar infarcts.  OBJECTIVE  CBC    Component Value Date/Time   WBC 11.8 (H) 01/05/2024 0935   RBC 4.91 01/05/2024 0935   HGB 15.1 01/05/2024 0935   HCT 44.0 01/05/2024 0935   PLT 205 01/05/2024 0935   MCV 89.6 01/05/2024 0935   MCH 30.8 01/05/2024 0935   MCHC 34.3 01/05/2024 0935   RDW 12.9 01/05/2024 0935   LYMPHSABS 1.8 01/04/2024 0857   MONOABS 0.8 01/04/2024 0857   EOSABS 0.1 01/04/2024 0857   BASOSABS 0.1 01/04/2024 0857    BMET    Component Value Date/Time   NA 137 01/05/2024 0935   NA 138 09/21/2017 1156   K 3.4 (L) 01/05/2024 0935   CL 104 01/05/2024 0935   CO2 21 (L) 01/05/2024 0935   GLUCOSE 290 (H) 01/05/2024 0935   BUN 25 (H) 01/05/2024 0935   BUN 17 09/21/2017 1156   CREATININE 1.38 (H) 01/05/2024 0935   CREATININE 1.64 (H) 11/12/2023 1540   CALCIUM  8.9 01/05/2024 0935   GFRNONAA 57 (L) 01/05/2024 0935    IMAGING past 24 hours ECHOCARDIOGRAM COMPLETE Result Date: 01/05/2024    ECHOCARDIOGRAM REPORT   Patient Name:   Tony Manning Date of Exam: 01/05/2024 Medical Rec #:  [id number]           Height:       70.0 in Accession #:    [id number]          Weight:       203.0 lb Date of Birth:  1961-03-13           BSA:          2.101 m Patient Age:    62 years            BP:           141/82 mmHg Patient Gender: M                   HR:           67 bpm. Exam Location:  Inpatient Procedure: 2D Echo, Cardiac Doppler and Color Doppler (Both Spectral and Color            Flow Doppler were utilized during procedure). Indications:    Stroke  History:        Patient has no prior history of Echocardiogram examinations.                 Previous Myocardial Infarction and CAD, Signs/Symptoms:Chest                 Pain; Risk  Factors:Hypertension, Diabetes and Sleep Apnea.  Sonographer:    Willey Harrier Referring Phys: Tyra Galley A WOLFE IMPRESSIONS  1. Left ventricular ejection fraction, by estimation, is 60 to 65%. The left ventricle has normal function. The left ventricle has no regional wall motion abnormalities. There is mild left ventricular hypertrophy of the septal segment. Left ventricular diastolic parameters are consistent with Grade I diastolic dysfunction (impaired relaxation).  2. Right ventricular systolic function is normal. The right ventricular size is normal.  3. The mitral valve is normal in structure. No evidence of mitral valve regurgitation. No evidence of mitral stenosis.  4. The aortic valve is tricuspid. Aortic valve regurgitation is not visualized. No aortic stenosis is  present.  5. The inferior vena cava is normal in size with greater than 50% respiratory variability, suggesting right atrial pressure of 3 mmHg. FINDINGS  Left Ventricle: Left ventricular ejection fraction, by estimation, is 60 to 65%. The left ventricle has normal function. The left ventricle has no regional wall motion abnormalities. The left ventricular internal cavity size was normal in size. There is  mild left ventricular hypertrophy of the septal segment. Left ventricular diastolic parameters are consistent with Grade I diastolic dysfunction (impaired relaxation). Right Ventricle: The right ventricular size is normal. No increase in right ventricular wall thickness. Right ventricular systolic function is normal. Left Atrium: Left atrial size was normal in size. Right Atrium: Right atrial size was normal in size. Pericardium: There is no evidence of pericardial effusion. Mitral Valve: The mitral valve is normal in structure. No evidence of mitral valve regurgitation. No evidence of mitral valve stenosis. MV peak gradient, 4.7 mmHg. The mean mitral valve gradient is 2.0 mmHg. Tricuspid Valve: The tricuspid valve is normal in structure.  Tricuspid valve regurgitation is not demonstrated. No evidence of tricuspid stenosis. Aortic Valve: The aortic valve is tricuspid. Aortic valve regurgitation is not visualized. No aortic stenosis is present. Aortic valve peak gradient measures 12.8 mmHg. Pulmonic Valve: The pulmonic valve was normal in structure. Pulmonic valve regurgitation is mild. No evidence of pulmonic stenosis. Aorta: The aortic root is normal in size and structure. Venous: The inferior vena cava is normal in size with greater than 50% respiratory variability, suggesting right atrial pressure of 3 mmHg. IAS/Shunts: No atrial level shunt detected by color flow Doppler.  LEFT VENTRICLE PLAX 2D LVIDd:         4.50 cm   Diastology LVIDs:         2.90 cm   LV e' medial:    9.90 cm/s LV PW:         0.90 cm   LV E/e' medial:  9.5 LV IVS:        1.10 cm   LV e' lateral:   12.60 cm/s LVOT diam:     2.00 cm   LV E/e' lateral: 7.5 LV SV:         84 LV SV Index:   40 LVOT Area:     3.14 cm  RIGHT VENTRICLE             IVC RV Basal diam:  4.30 cm     IVC diam: 1.40 cm RV S prime:     15.20 cm/s TAPSE (M-mode): 2.4 cm LEFT ATRIUM             Index        RIGHT ATRIUM           Index LA Vol (A2C):   40.2 ml 19.14 ml/m  RA Area:     14.10 cm LA Vol (A4C):   29.5 ml 14.04 ml/m  RA Volume:   35.50 ml  16.90 ml/m LA Biplane Vol: 37.5 ml 17.85 ml/m  AORTIC VALVE AV Area (Vmax): 2.19 cm AV Vmax:        179.00 cm/s AV Peak Grad:   12.8 mmHg LVOT Vmax:      125.00 cm/s LVOT Vmean:     88.100 cm/s LVOT VTI:       0.266 m  AORTA Ao Root diam: 3.10 cm Ao Asc diam:  3.10 cm MITRAL VALVE MV Area (PHT): 3.05 cm     SHUNTS MV Area VTI:   2.11 cm  Systemic VTI:  0.27 m MV Peak grad:  4.7 mmHg     Systemic Diam: 2.00 cm MV Mean grad:  2.0 mmHg MV Vmax:       1.08 m/s MV Vmean:      67.0 cm/s MV Decel Time: 249 msec MV E velocity: 94.30 cm/s MV A velocity: 101.00 cm/s MV E/A ratio:  0.93 Maudine Sos MD Electronically signed by Maudine Sos MD Signature  Date/Time: 01/05/2024/12:13:53 PM    Final    MR BRAIN WO CONTRAST Result Date: 01/05/2024 CLINICAL DATA:  63 year old male code stroke presentation yesterday, status post T NK, proximal left vertebral artery occlusion but reconstitution by CTA. EXAM: MRI HEAD WITHOUT CONTRAST TECHNIQUE: Multiplanar, multiecho pulse sequences of the brain and surrounding structures were obtained without intravenous contrast. COMPARISON:  CT head, CTA head and neck yesterday. Brain MRI 07/29/2020. FINDINGS: Brain: Patchy restricted diffusion in the left cerebellum is primarily in the SCA territory on series 2, image 17. But there are additional punctate foci of abnormal diffusion in the posterior and lateral left cerebellar hemisphere (series 2, image 15) and also in the contralateral right occipital pole (series 2, image 21). The right cerebellar hemispheres spared. No convincing brainstem involvement. No anterior circulation restricted diffusion. No midline shift, mass effect, evidence of mass lesion, ventriculomegaly, extra-axial collection or acute intracranial hemorrhage. Cervicomedullary junction and pituitary are within normal limits. Mild T2 and FLAIR hyperintensity in the left SCA territory involvement., also at the right occipital pole. But otherwise stable gray and white matter signal throughout the brain since 2021 with minimal for age nonspecific supratentorial white matter T2 and FLAIR hyperintensity. No cortical encephalomalacia or chronic cerebral blood products identified. Deep gray nuclei and brainstem are within normal limits. Vascular: Major intracranial vascular flow voids are stable compared to the 2021 study including preserved dominant distal left vertebral artery. Skull and upper cervical spine: Negative. Visualized bone marrow signal is within normal limits. Sinuses/Orbits: Stable, negative. Other: Mastoids are clear. Visible internal auditory structures appear normal. IMPRESSION: 1. Patchy acute infarcts  in the Left cerebellum SCA territory, with several punctate additional ischemic foci elsewhere in the left cerebellar hemisphere, and also at the right occipital pole. This pattern corroborates the suspected acute left vertebral artery origin occlusion by CTA, with distal thromboembolic phenomena. 2. No associated hemorrhage or mass effect. And elsewhere stable since 2021 and largely normal for age noncontrast MRI appearance of the brain. Electronically Signed   By: Marlise Simpers M.D.   On: 01/05/2024 09:25    Vitals:   01/05/24 1000 01/05/24 1100 01/05/24 1200 01/05/24 1300  BP: (!) 134/53 (!) 164/80 (!) 135/93 (!) 146/79  Pulse: 64 69 70 66  Resp: 12 15 14  (!) 29  Temp:   98.4 F (36.9 C)   TempSrc:   Axillary   SpO2: 95% 96% 94% 97%  Weight:      Height:         PHYSICAL EXAM General:  Alert, well-nourished, well-developed patient in no acute distress Psych:  Mood and affect appropriate for situation CV: Regular rate and rhythm on monitor Respiratory:  Regular, unlabored respirations on room air GI: Abdomen soft and nontender   NEURO:  Mental Status: AA&Ox3, patient is able to give clear and coherent history Speech/Language: speech is without dysarthria or aphasia.    Cranial Nerves:  II: PERRL. Visual fields full.  III, IV, VI: EOMI. Eyelids elevate symmetrically.  V: Sensation is intact to light touch and symmetrical to face.  VII:  Face is symmetrical resting and smiling VIII: hearing intact to voice. IX, X: Phonation is normal.  GN:FAOZHYQM shrug 5/5. XII: tongue is midline without fasciculations. Motor: 5/5 strength to all muscle groups tested.  Tone: is normal and bulk is normal Sensation- Intact to light touch bilaterally.  Coordination: FTN intact bilaterally, HKS: no ataxia in BLE.No drift.  Gait- deferred  Most Recent NIH 0   ASSESSMENT/PLAN  Tony Manning is a 63 y.o. male with history of basal cell carcinoma of the face, CKD, CAD with 5 stents,  depression, diabetes, hypertension, hyperlipidemia, migraines, sarcoidosis and sleep apnea on CPAP admitted for acute onset chest pain, dizziness and left-sided weakness.  Patient was given TNK to treat potential stroke, and infarcts in the left cerebellum as well as right occipital pole were seen on MRI.  NIH on Admission 6  Stroke:  left cerebellar (PCA and SCA) stroke as well as small infarct in right occipital pole s/p TNK, etiology: embolic pattern, cryptogenic  Code Stroke CT head No acute abnormality. ASPECTS 10.    CTA head & neck streak artifact at left thoracic inlet but strong evidence of left vertebral artery occlusive origin occlusion with reconstitution at proximal V2 segment MRI patchy acute infarcts in the left cerebellum SCA territory with several punctate acute ischemic foci elsewhere in the left cerebellar hemisphere and at the right occipital pole 2D Echo EF 60 to 65%, grade 1 diastolic dysfunction, normal left atrial size, no atrial level shunt LE venous doppler no DVT TEE pending Patient will likely need loop recorder prior to discharge LDL 41 HgbA1c 8.4 UDS neg VTE prophylaxis -Lovenox aspirin  81 mg daily prior to admission, now on aspirin  81 mg daily and clopidogrel  75 mg daily for 3 weeks and then Plavix  alone. Will also discuss about Librexia stroke trial Therapy recommendations:  No follow up needed  Disposition: pending  Hypertension Home meds: Losartan  50 mg daily, carvedilol  3.125 mg daily Stable Long term BP goal normotensive  Hyperlipidemia Home meds: Atorvastatin  40 mg daily, resumed in hospital LDL 41, goal < 70 Continue statin at discharge  Diabetes type II Uncontrolled Home meds: Tresiba  40 units daily, semaglutide  1 mg weekly HgbA1c 8.4, goal < 7.0 CBGs SSI Recommend close follow-up with PCP for better DM control  Other Stroke Risk Factors Coronary artery disease s/p stenting - followed with Dr. Berry Bristol migraine OSA on CPAP  Other Active  Problems CKD 3a Cre 1.45 Sarcoidosis BPH on flomax   Hospital day # 1  Patient seen by NP with MD, MD to edit note as needed. Cortney E Bucky Cardinal , MSN, AGACNP-BC [facility] See Amion for schedule and pager information 01/05/2024 2:04 PM   ATTENDING NOTE: I reviewed above note and agree with the assessment and plan. Pt was seen and examined.   Wife at the bedside. Pt doing well, stated that his dizziness has resolved. No more left sided weakness. Neuro exam intact now. MRI showed small scattered infarcts, echo unremarkable, will need TEE and loop.continue DAPT and statin. Hyperglycemia, put on insulin . Will discuss about Librexia stroke trial. PT OT no recs  For detailed assessment and plan, please refer to above as I have made changes wherever appropriate.   Consuelo Denmark, MD PhD Stroke Neurology 01/05/2024 5:08 PM  This patient is critically ill due to embolic stroke status post TNK and at significant risk of neurological worsening, death form recurrent stroke, hemorrhagic transmission, bleeding from TNK. This patient's care requires constant monitoring of vital signs,  hemodynamics, respiratory and cardiac monitoring, review of multiple databases, neurological assessment, discussion with family, other specialists and medical decision making of high complexity. I spent 40 minutes of neurocritical care time in the care of this patient. I had long discussion with wife and patient at bedside, updated pt current condition, treatment plan and potential prognosis, and answered all the questions.  They expressed understanding and appreciation.      To contact Stroke Continuity provider, please refer to [email or web address]. After hours, contact General Neurology

## 2024-01-05 NOTE — Progress Notes (Signed)
 PT Cancellation Note  Patient Details Name: Tony Manning MRN: [id number] DOB: March 03, 1961   Cancelled Treatment:    Reason Eval/Treat Not Completed: (P) Active bedrest order. Coordinated with RN to notify therapy if pt's bedrest orders are discontinued later today. Will plan to follow-up as able once activity orders are progressed.   Vernida Goodie, PT, DPT Acute Rehabilitation Services  Office: [phone]    Ellyn Hack 01/05/2024, 7:48 AM

## 2024-01-05 NOTE — Inpatient Diabetes Management (Signed)
 Inpatient Diabetes Program Recommendations  AACE/ADA: New Consensus Statement on Inpatient Glycemic Control (2015)  Target Ranges:  Prepandial:   less than 140 mg/dL      Peak postprandial:   less than 180 mg/dL (1-2 hours)      Critically ill patients:  140 - 180 mg/dL   Lab Results  Component Value Date   GLUCAP 196 (H) 01/05/2024   HGBA1C 8.4 (H) 11/12/2023    Review of Glycemic Control  Latest Reference Range & Units 01/04/24 08:57 01/04/24 16:36 01/04/24 21:06 01/05/24 07:34  Glucose-Capillary 70 - 99 mg/dL 161 (H) 096 (H) 045 (H) 196 (H)   Diabetes history: DM 2 Outpatient Diabetes medications: Tresiba  40 units Daily, metformin  500 mg bid, Ozempic  1 mg weekly Current orders for Inpatient glycemic control:  Novolog  0-15 units tid  Inpatient Diabetes Program Recommendations:    -   Start Semglee  10 units  Thanks,  Eloise Hake RN, MSN, BC-ADM Inpatient Diabetes Coordinator Team Pager [phone] (8a-5p)

## 2024-01-05 NOTE — TOC CAGE-AID Note (Signed)
 Transition of Care [facility]) - CAGE-AID Screening   Patient Details  Name: Tony Manning MRN: [id number] Date of Birth: 1960/11/30  Transition [facility] Care [facility]) CM/SW Contact:    Icel Castles E Frances Joynt, LCSW Phone Number: 01/05/2024, 11:42 AM   Clinical Narrative: Patient denies substance use.   CAGE-AID Screening:    Have You Ever Felt You Ought to Cut Down on Your Drinking or Drug Use?: No Have People Annoyed You By Critizing Your Drinking Or Drug Use?: No Have You Felt Bad Or Guilty About Your Drinking Or Drug Use?: No Have You Ever Had a Drink or Used Drugs First Thing In The Morning to Steady Your Nerves or to Get Rid of a Hangover?: No CAGE-AID Score: 0  Substance Abuse Education Offered: No

## 2024-01-05 NOTE — TOC Initial Note (Signed)
 Transition of Care [facility]) - Initial/Assessment Note    Patient Details  Name: Tony Manning MRN: [id number] Date of Birth: 10/17/1960  Transition of Care [facility]) CM/SW Contact:    Overton Boggus E Oree Mirelez, LCSW Phone Number: 01/05/2024, 11:43 AM  Clinical Narrative:                 Met with patient and spouse at bedside. Patient lives with spouse. Patient drives at baseline. PCP is Dr. Vallarie Gauze. Patient has been to OPPT in the past. Patient has a good support system. Patient has a CPAP and has access to a walker and cane if needed.    Barriers to Discharge: Continued Medical Work up   Patient Goals and CMS Choice   CMS Medicare.gov Compare Post Acute Care list provided to:: Patient Choice offered to / list presented to : Patient      Expected Discharge Plan and Services       Living arrangements for the past 2 months: Single Family Home                                      Prior Living Arrangements/Services Living arrangements for the past 2 months: Single Family Home Lives with:: Spouse Patient language and need for interpreter reviewed:: Yes Do you feel safe going back to the place where you live?: Yes      Need for Family Participation in Patient Care: Yes (Comment) Care giver support system in place?: Yes (comment) Current home services: DME Criminal Activity/Legal Involvement Pertinent to Current Situation/Hospitalization: No - Comment as needed  Activities of Daily Living   ADL Screening (condition at time of admission) Independently performs ADLs?: Yes (appropriate for developmental age) Is the patient deaf or have difficulty hearing?: No Does the patient have difficulty seeing, even when wearing glasses/contacts?: No Does the patient have difficulty concentrating, remembering, or making decisions?: No  Permission Sought/Granted Permission sought to share information with : Facility Medical sales representative, Family Supports Permission granted to share  information with : Yes, Verbal Permission Granted     Permission granted to share info w AGENCY: as needed  Permission granted to share info w Relationship: Sheryl - spouse     Emotional Assessment       Orientation: : Oriented to Self, Oriented to Place, Oriented to  Time, Oriented to Situation Alcohol / Substance Use: Not Applicable Psych Involvement: No (comment)  Admission diagnosis:  Acute ischemic stroke (HCC) [I63.9] Cerebrovascular accident (CVA), unspecified mechanism (HCC) [I63.9] Patient Active Problem List   Diagnosis Date Noted   Acute ischemic stroke (HCC) 01/04/2024   Hip pain 05/23/2023   Vertigo 04/21/2023   Lightheaded 04/03/2023   Pre-operative respiratory examination 03/08/2023   Orthostatic hypotension 02/23/2023   Concussion 08/05/2022   Near syncope 12/25/2021   Advance care planning 06/13/2021   Insomnia 06/13/2021   B12 deficiency 01/30/2021   Fatigue 01/29/2021   Abdominal discomfort 01/05/2021   SOB (shortness of breath) 11/12/2020   Sarcoidosis 11/12/2020   H/O colonoscopy 10/27/2020   Nocturia 05/03/2020   Right sided weakness 03/06/2020   Low back pain 01/29/2020   Facial weakness 11/16/2019   Hematuria 03/22/2018   Renal stones 03/22/2018   Vitamin D  deficiency, unspecified 10/04/2017   Chronic neck pain (Primary Area of Pain) (B) (L>R) 09/21/2017   Chronic upper extremity pain (Secondary Area of Pain) (Bilateral) (L>R) 09/21/2017   Chronic left shoulder pain [facility] Area  of Pain) 09/21/2017   Chronic low back pain (Fourth Area of Pain) (B) (R>L) 09/21/2017   Right leg pain 09/21/2017   Other long term (current) drug therapy 09/21/2017   Other specified health status 09/21/2017   Other chronic pain 08/22/2017   Other fatigue 05/10/2017   Radiculopathy 04/07/2017   Angina pectoris (HCC) 10/15/2016   Cervical radiculitis 07/23/2016   Dysphagia 06/12/2016   Sciatica 02/06/2016   Plantar fasciitis 01/16/2016   Right sided sciatica  05/16/2015   Hand pain 04/05/2015   Premature ejaculation 09/17/2014   Heel pain 09/17/2014   NSTEMI (non-ST elevated myocardial infarction) (HCC) 06/13/2014   Atypical chest pain 06/06/2014   Memory change 05/24/2014   Benign paroxysmal positional vertigo 07/13/2013   Orthostasis 07/13/2013   Headache, unspecified headache type 04/03/2013   Pain in joint, ankle and foot 10/19/2012   Irritable mood 01/18/2012   Neck pain 12/03/2010   Hearing loss 12/03/2010   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   HYPERCHOLESTEROLEMIA 10/20/2010   DEPRESSION 10/20/2010   Osteoarthritis 10/20/2010   Arthropathy 10/20/2010   NEPHROLITHIASIS, HX OF 10/20/2010   Essential hypertension, benign 09/30/2010   CORONARY ATHEROSCLEROSIS NATIVE CORONARY ARTERY 09/30/2010   Sleep apnea 09/30/2010   CHEST PAIN UNSPECIFIED 09/30/2010   PERCUTANEOUS TRANSLUMINAL CORONARY ANGIOPLASTY, HX OF 09/30/2010   PCP:  Donnie Galea, MD Pharmacy:   [facility] [phone] - [address] [place] - [address] [place] [address] Phone: [phone] Fax: [phone]     Social Drivers of Health (SDOH) Social History: SDOH Screenings   Food Insecurity: No Food Insecurity (01/04/2024)  Housing: Low Risk  (01/04/2024)  Transportation Needs: No Transportation Needs (01/04/2024)  Utilities: Not At Risk (01/04/2024)  Alcohol Screen: Low Risk  (09/20/2020)  Depression (PHQ2-9): Low Risk  (11/12/2023)  Financial Resource Strain: Low Risk  (11/04/2022)  Physical Activity: Insufficiently Active (11/04/2022)  Social Connections: Moderately Isolated (11/04/2022)  Stress: No Stress Concern Present (11/04/2022)  Tobacco Use: Medium Risk (01/04/2024)   SDOH Interventions:     Readmission Risk Interventions     No data to display

## 2024-01-05 NOTE — Progress Notes (Signed)
 Lower extremity venous duplex completed. Please see CV Procedures for preliminary results.  Estanislao Heimlich, RVT 01/05/24 3:37 PM

## 2024-01-05 NOTE — Progress Notes (Signed)
 [place] [facility] New Study Start: LIBREXIA-STROKE   SUMMARY For more information refer to: [email or web address] Study Identifier: [id number] A Phase 3, Randomized, Double-Blind, Parallel-Group, Placebo-Controlled Study to Demonstrate the Efficacy and Safety of Milvexian, an Oral Factor XIa Inhibitor, for Stroke Prevention after an Acute Ischemic Stroke or High-Risk Transient Ischemic Attack  Brief Summary This study will evaluate the efficacy and safety of milvexian in participants after an acute ischemic stroke or high-risk TIA who are receiving antiplatelet therapy standard-of-care.  Design Phase 3, Randomized, Double-Blind, Interventional, Event-Driven  Intervention [id number] (Milvexian) 25 mg or Placebo     Concomitant Therapy Participants will receive SAPT or DAPT. The SAPT or DAPT may be started prior to randomization and the type of antiplatelet agent(s), and duration of treatment will be at the discretion of the investigator. If ASA is used, it will be limited to low dose (75 to 100 mg/day) NSAID (except ASA) may be used concomitantly on a temporary basis but should be avoided for chronic use more than 4 weeks of consecutive therapy)  Prohibited Therapy Chronic (>4 weeks of consecutive use) use of ASA >100 mg per day Current or planned use of isoniazid Concomitant use of omeprazole or esomeprazole with clopidogrel  is prohibited. Other use of PPI is allowed and encouraged Additional anticoagulants (e.g., vitamin k antagonists, factor IIa or FXa inhibitors) Use of a combined P-gp and strong CYP3A4 inhibitor (e.g., atazanavir, clarithromycin, itraconazole, ketoconazole, ritonavir, saquinavir) within 7 days of receiving study intervention and during the study is prohibited Use of a combined P-gp and strong CYP3A4 inducer (e.g., carbamazepine, phenytoin, rifampin) within 7 days of receiving study intervention and during the study is prohibited * Prohibited therapies  may be administered on a temporary basis, and if administered, the investigator should discontinue the study intervention. Study intervention may be restarted after the prohibited therapy has been discontinued and after the completion of a suitable washout period at the investigator's discretion  Anticoagulation Prophylaxis The use of anticoagulants for post-stroke DVT prophylaxis after the 3-day window is prohibited and non-pharmacological prophylaxis (e.g., intermittent pneumatic compression) is recommended.   Potential Drug-Drug Interactions Milvexian metabolism: Substrate of CYP3A4; use caution with coadministration of strong CYP3A4 inducers and inhibitors  Administration  Take 1 tablet by mouth twice daily without regards to food intake, at approximately the same time each day. For participants unable to swallow medication, the tablet can be dispersed in water and given via NG tube or in applesauce.  Missed dose If a dose of medication is missed, the dose should be taken as soon as possible. If the missed dose cannot be taken at regular time, next dose should not be doubled. Resume at the next scheduled dose      Plan: Start [Milvexian 25 mg tablets or placebo] today. Study medication must be picked up from pharmacy, medication can not be tubed.    Please contact [facility] if any questions or concerns regarding the study medication.    Ulyses Gandy, PharmD Clinical Pharmacist, [facility] 01/05/2024 3:19 PM      [facility]igational Drug Service Pharmacist  [phone]

## 2024-01-05 NOTE — Evaluation (Signed)
 Occupational Therapy Evaluation Patient Details Name: Tony Manning MRN: [id number] DOB: [birth year] Today's Date: 01/05/2024   History of Present Illness   63 yo male complained for dizziness and L side weakness to EMS.  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 All education has been completed and the patient has no further questions. See below for any follow-up Occupational Therapy or equipment needs. OT to sign off. Thank you for referral.   Reviewed Signs symptoms of stroke, risk factors using the stroke book in room. (Wife present)     If plan is discharge home, recommend the following:   Assist for transportation     Functional Status Assessment   Patient has had a recent decline in their functional status and demonstrates the ability to make significant improvements in function in a reasonable and predictable amount of time.     Equipment Recommendations   None recommended by OT     Recommendations for Other Services         Precautions/Restrictions   Precautions Precautions: Fall (low risk) Restrictions Weight Bearing Restrictions Per Provider Order: No     Mobility Bed Mobility Overal bed mobility: Independent                  Transfers Overall transfer level: Modified independent                        Balance Overall balance assessment: Mild deficits observed, not formally tested                               Standardized Balance  Assessment Standardized Balance Assessment :  (see PT eval)         ADL either performed or assessed with clinical judgement   ADL Overall ADL's : Modified independent                                       General ADL Comments: advised to sit during dressing to decrease fall risk. pt able to pick object off the floor, pt able to simulate turning in the shower. pt with decreased balance with narrowed base of support and falling R. pt falling R with vision occlusion     Vision Baseline Vision/History: 1 Wears glasses Ability to See in Adequate Light: 0 Adequate Patient Visual Report: No change from baseline Vision Assessment?: Yes Eye Alignment: Within Functional Limits Ocular Range of Motion: Within Functional Limits Alignment/Gaze Preference: Within Defined Limits Tracking/Visual Pursuits: Able to track stimulus in all quads without difficulty Saccades: Within functional limits Convergence: Within functional limits Additional Comments: able to follow 3 step written instruction  to draw clock correctly. pt able to scan adequately     Perception         Praxis         Pertinent Vitals/Pain Pain Assessment Pain Assessment: Faces Faces Pain Scale: No hurt  Extremity/Trunk Assessment Upper Extremity Assessment Upper Extremity Assessment: Overall WFL for tasks assessed   Lower Extremity Assessment Lower Extremity Assessment: Defer to PT evaluation LLE Deficits / Details: very mild weakness noted with hip flexion and quads 4+ (5 on R), anterior tibialis 5/5; coordination grossly intact; sensation intact   Cervical / Trunk Assessment Cervical / Trunk Assessment: Neck Surgery;Other exceptions (baseline back pain)   Communication Communication Communication: No apparent difficulties   Cognition Arousal: Alert Behavior During Therapy: WFL for tasks assessed/performed Cognition: No apparent impairments                                Following commands: Intact       Cueing  General Comments   Cueing Techniques: Verbal cues  VSS on RA   Exercises     Shoulder Instructions      Home Living Family/patient expects to be discharged to:: Private residence Living Arrangements: Spouse/significant other Available Help at Discharge: Family;Available 24 hours/day Type of Home: House Home Access: Level entry (to basement, they typically go this way)     Home Layout: Multi-level;Bed/bath upstairs (has to go up to 1st floor from basement entry to get to bedroom, does not go to 3rd level) Alternate Level Stairs-Number of Steps: 12 (has stair chair lift) Alternate Level Stairs-Rails: Right Bathroom Shower/Tub: Producer, television/film/video: Handicapped height     Home Equipment: Rollator (4 wheels);Cane - single point;Crutches;Other (comment);BSC/3in1;Shower seat;Hand held shower head;Wheelchair - manual (stair chair lift)      Lives With: Spouse    Prior Functioning/Environment Prior Level of Function : Independent/Modified Independent;Working/employed;Driving             Mobility Comments: No AD ADLs Comments: Works driveline, Nutritional therapist at stores    OT Problem List:     OT Treatment/Interventions:        OT Goals(Current goals can be found in the care plan section)   Acute Rehab OT Goals Potential to Achieve Goals: Good   OT Frequency:       Co-evaluation PT/OT/SLP Co-Evaluation/Treatment: Yes Reason for Co-Treatment: Necessary to address cognition/behavior during functional activity;For patient/therapist safety;To address functional/ADL transfers   OT goals addressed during session: Proper use of Adaptive equipment and DME;ADL's and self-care;Strengthening/ROM      AM-PAC OT "6 Clicks" Daily Activity     Outcome Measure Help from another person eating meals?: None Help from another person taking care of personal grooming?: None Help from another person toileting, which  includes using toliet, bedpan, or urinal?: None Help from another person bathing (including washing, rinsing, drying)?: None Help from another person to put on and taking off regular upper body clothing?: None Help from another person to put on and taking off regular lower body clothing?: None 6 Click Score: 24   End of Session Equipment Utilized During Treatment: Gait belt Nurse Communication: Mobility status;Precautions  Activity Tolerance: Patient tolerated treatment well Patient left: in chair;with call bell/phone within reach;with chair alarm set;with family/visitor present  OT Visit Diagnosis: Unsteadiness on feet (R26.81)                Time: 1478-2956 OT Time Calculation (min): 30 min Charges:  OT General Charges $OT Visit: 1 Visit OT Evaluation $OT Eval Moderate Complexity: 1 Mod   Brynn, OTR/L  Acute Rehabilitation Services Office: [phone] .   Neomia Banner 01/05/2024, 11:34 AM

## 2024-01-05 NOTE — Evaluation (Signed)
 Speech Language Pathology Evaluation Patient Details Name: Tony Manning MRN: [id number] DOB: April 05, 1961 Today's Date: 01/05/2024 Time: 8469-6295 SLP Time Calculation (min) (ACUTE ONLY): 18 min  Problem List:  Patient Active Problem List   Diagnosis Date Noted   Acute ischemic stroke (HCC) 01/04/2024   Hip pain 05/23/2023   Vertigo 04/21/2023   Lightheaded 04/03/2023   Pre-operative respiratory examination 03/08/2023   Orthostatic hypotension 02/23/2023   Concussion 08/05/2022   Near syncope 12/25/2021   Advance care planning 06/13/2021   Insomnia 06/13/2021   B12 deficiency 01/30/2021   Fatigue 01/29/2021   Abdominal discomfort 01/05/2021   SOB (shortness of breath) 11/12/2020   Sarcoidosis 11/12/2020   H/O colonoscopy 10/27/2020   Nocturia 05/03/2020   Right sided weakness 03/06/2020   Low back pain 01/29/2020   Facial weakness 11/16/2019   Hematuria 03/22/2018   Renal stones 03/22/2018   Vitamin D  deficiency, unspecified 10/04/2017   Chronic neck pain (Primary Area of Pain) (B) (L>R) 09/21/2017   Chronic upper extremity pain (Secondary Area of Pain) (Bilateral) (L>R) 09/21/2017   Chronic left shoulder pain (Tertiary Area of Pain) 09/21/2017   Chronic low back pain (Fourth Area of Pain) (B) (R>L) 09/21/2017   Right leg pain 09/21/2017   Other long term (current) drug therapy 09/21/2017   Other specified health status 09/21/2017   Other chronic pain 08/22/2017   Other fatigue 05/10/2017   Radiculopathy 04/07/2017   Angina pectoris (HCC) 10/15/2016   Cervical radiculitis 07/23/2016   Dysphagia 06/12/2016   Sciatica 02/06/2016   Plantar fasciitis 01/16/2016   Right sided sciatica 05/16/2015   Hand pain 04/05/2015   Premature ejaculation 09/17/2014   Heel pain 09/17/2014   NSTEMI (non-ST elevated myocardial infarction) (HCC) 06/13/2014   Atypical chest pain 06/06/2014   Memory change 05/24/2014   Benign paroxysmal positional vertigo 07/13/2013   Orthostasis  07/13/2013   Headache, unspecified headache type 04/03/2013   Pain in joint, ankle and foot 10/19/2012   Irritable mood 01/18/2012   Neck pain 12/03/2010   Hearing loss 12/03/2010   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   HYPERCHOLESTEROLEMIA 10/20/2010   DEPRESSION 10/20/2010   Osteoarthritis 10/20/2010   Arthropathy 10/20/2010   NEPHROLITHIASIS, HX OF 10/20/2010   Essential hypertension, benign 09/30/2010   CORONARY ATHEROSCLEROSIS NATIVE CORONARY ARTERY 09/30/2010   Sleep apnea 09/30/2010   CHEST PAIN UNSPECIFIED 09/30/2010   PERCUTANEOUS TRANSLUMINAL CORONARY ANGIOPLASTY, HX OF 09/30/2010   Past Medical History:  Past Medical History:  Diagnosis Date   Anginal pain (HCC)    Basal cell carcinoma of face    Chest pain, unspecified    Chronic kidney disease    kidney stones   Coronary atherosclerosis of native coronary artery    s/p stent x5   Depression    Diabetes mellitus without mention of complication    Diverticulosis    Dyspnea    Dysrhythmia    Erectile dysfunction    Essential hypertension, benign    Family history of adverse reaction to anesthesia    GERD (gastroesophageal reflux disease)    Heart murmur    History of kidney stones    History of nephrolithiasis    Hyperlipidemia    Migraine    Myocardial infarction (HCC)    2016   Neuromuscular disorder (HCC)    Osteoarthritis    Pneumonia    Postsurgical percutaneous transluminal coronary angioplasty status    s/p stent x4   Sarcoidosis    Sleep apnea    uses  CPAP   Unspecified sleep apnea    uses C-pap   Past Surgical History:  Past Surgical History:  Procedure Laterality Date   ANTERIOR CERVICAL DECOMP/DISCECTOMY FUSION N/A 04/07/2017   Procedure: ANTERIOR CERVICAL DECOMPRESSION FUSION, CERVICAL 4-5 WITH INSTRUMENTATION AND ALLOGRAFT;  Surgeon: Virl Grimes, MD;  Location: [facility] OR;  Service: Orthopedics;  Laterality: N/A;  ANTERIOR CERVICAL DECOMPRESSION FUSION,  CERVICAL 4-5 WITH INSTRUMENTATION AND ALLOGRAFT; REQUEST 2.5 HOURS AND FLIP ROOM   APPENDECTOMY  2005   CARPAL TUNNEL RELEASE     CERVICAL FUSION  2009   COLONOSCOPY     CORONARY ANGIOPLASTY WITH STENT PLACEMENT     2004   CORONARY STENT INTERVENTION N/A 10/16/2016   Procedure: Coronary Stent Intervention;  Surgeon: Knox Perl, MD;  Location: [facility] INVASIVE CV LAB;  Service: Cardiovascular;  Laterality: N/A;   CYSTOSCOPY WITH RETROGRADE PYELOGRAM, URETEROSCOPY AND STENT PLACEMENT Right 05/12/2019   Procedure: CYSTOSCOPY WITH RIGHT RETROGRADE PYELOGRAM, URETEROSCOPY HOLMIUM LASER AND STENT PLACEMENT;  Surgeon: Samson Croak, MD;  Location: [facility] ORS;  Service: Urology;  Laterality: Right;   ELBOW SURGERY     bilateral   HAND SURGERY Left    KNEE SURGERY Bilateral    Bilateral    LEFT HEART CATH AND CORONARY ANGIOGRAPHY N/A 10/16/2016   Procedure: Left Heart Cath and Coronary Angiography;  Surgeon: Knox Perl, MD;  Location: [facility] INVASIVE CV LAB;  Service: Cardiovascular;  Laterality: N/A;   LEFT HEART CATHETERIZATION WITH CORONARY ANGIOGRAM N/A 06/14/2014   Procedure: LEFT HEART CATHETERIZATION WITH CORONARY ANGIOGRAM;  Surgeon: Jessica Morn, MD;  Location: [facility] CATH LAB;  Service: Cardiovascular;  Laterality: N/A;   LUNG BIOPSY     sarcoid   ROTATOR CUFF REPAIR     left   SHOULDER ARTHROSCOPY WITH SUBACROMIAL DECOMPRESSION Left 06/16/2018   Procedure: LEFT SHOULDER ARTHROSCOPY WITH ROTATOR CUFF DEBRIDEMENT AND  SUBACROMIAL DECOMPRESSION;  Surgeon: Sammye Cristal, MD;  Location: [facility] OR;  Service: Orthopedics;  Laterality: Left;   HPI:  63 yo presenting with L sided weakness, slurred speech, and dizziness. MRI positive for patchy acute infarcts in the L cerebellum and at the R occipital pole s/p TNK. PMHx of anginal pain, basal cell carcinoma of the face, CKD, CAD s/p stenting x 5, depression, DM, HTN, heart murmur, nephrolithiasis, HLD, migraine, sarcoidosis, sleep apnea (on CPAP) and prior  cervical discectomy with fusion   Assessment / Plan / Recommendation Clinical Impression  Pt reports having chronic memory difficulties and describes frequently losing items or forgetting things. 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 He scored 28/30 on the SLUMS, which is considered to be WNL, and both he and his wife believe that he is at his cognitive-linguistic baseline. They both acknowledge some slurred speech on previous date, but say that this has completely resolved. No further SLP needs identified at this time and education was given about precautions to take upon initial return home. SLP to sign off.      SLP Assessment  SLP Recommendation/Assessment: Patient does not need any further Speech Lanaguage Pathology Services SLP Visit Diagnosis: Cognitive communication deficit (R41.841)    Recommendations for follow up therapy are one component of a multi-disciplinary discharge planning process, led by the attending physician.  Recommendations may be updated based on patient status, additional functional criteria and insurance authorization.    Follow Up Recommendations  No SLP follow up    Assistance Recommended at Discharge  PRN  Functional Status Assessment Patient has not had a recent decline in their functional status  Frequency and Duration           SLP Evaluation Cognition  Overall Cognitive Status: History of cognitive impairments - at baseline Orientation Level: Oriented X4       Comprehension  Auditory Comprehension Overall Auditory Comprehension: Appears within functional limits for tasks assessed    Expression Expression Primary Mode of Expression: Verbal Verbal Expression Overall Verbal Expression: Appears within functional limits for tasks assessed   Oral / Motor  Motor Speech Overall Motor Speech: Appears within functional limits for tasks assessed            Beth Brooke., M.A.  CCC-SLP Acute Rehabilitation Services Office: [phone]  Secure chat preferred  01/05/2024, 10:56 AM

## 2024-01-05 NOTE — Research (Signed)
 PATIENT: Tony Manning DOB: 1961/02/07  Tony Manning is a 63 y.o. male who meets inclusion and exclusion criteria and may be a potential candidate for [place] stroke study.  A brief description of the study's purpose and duration was provided to see if the patient was interested in participating. The patient was told that study participation is voluntary, and if he choose not to participate, he will continue receiving the same treatment as any other patient.   Since the patient seemed interested in the trial, a copy of the informed consent was provided to him at  12pm today and I said I would come back later to discuss the study in more detail after he had time to read the informed consent.  Detailed discussion  I returned at 2:30pm today to discuss the study in detail.  I requested he ask any questions he may have as we reviewed the informed consent so I could answer them as we went along. I discussed the standard-of-care treatments, appropriate alternative treatments, procedures or devices that might be helpful to the patients, the clinical trial, and each option's relative risks and benefits and alternatives. We discussed the research participant's bill of rights. Then, we also reviewed the ICF page by page and discussed their responsibilities if they choose to participate, any compensation, and what medical treatment is available if injury occurs. Tony Manning was encouraged to discuss study participation with family, significant others, and primary care attending or physician to help reduce the possibility of undue influence by talking to the investigator alone.  The patient was reminded that the study participation is voluntary, and if he choose not to participate, he will continue receiving the same care as any other patient. Additionally, if he choose to participate and later decide he want to withdraw, he will continue to be treated as any other patient. Tony Manning was  told that he did not have to decide now.  Tony Manning answered questions appropriately to verbalize understanding of the informed consent.   The subject agreed to participate in the [place] stroke trial and signed the Research Participants Tony Manning of Rights and the informed consent at 2:45pm today.  The informed consent was obtained prior to performance of any protocol-specific procedures for the subject. He did not sign the optional real world data collection form. A copy of the Research Participants Tony Manning of Rights and signed informed consent was given to the subject, and a copy placed in the subject's medical record.   Tony Denmark, MD PhD Stroke Neurology 01/05/2024 4:56 PM   ADDENDUM: Of note, on 05/12/2019 with urology note, there was hx of afib mentioned in chart. However, there was no mentioning of afib else where in his entire chart. I had cardiology EP service reviewed his EKGs and cardiology notes, there was no afib. I also contacted his cardiology Dr. Berry Manning and he confirmed that pt has no established afib diagnosis. Pt and Tony Manning denies any hx of afib that they are aware of. Therefore, pt is candidate for Librexia stroke trial.   Tony Denmark, MD PhD Stroke Neurology 01/05/2024 5:01 PM

## 2024-01-05 NOTE — Progress Notes (Signed)
   [facility] has been requested to perform a transesophageal echocardiogram on Tony Manning for stroke.     The patient does NOT have any absolute or relative contraindications to a Transesophageal Echocardiogram (TEE).  The patient has: No other conditions that may impact this procedure.    After careful review of history and examination, the risks and benefits of transesophageal echocardiogram have been explained including risks of esophageal damage, perforation (1:10,000 risk), bleeding, pharyngeal hematoma as well as other potential complications associated with conscious sedation including aspiration, arrhythmia, respiratory failure and death. Alternatives to treatment were discussed, questions were answered. Patient is willing to proceed.   Signed, Warren Haber Valyn Latchford, [place]  01/05/2024 3:13 PM

## 2024-01-06 ENCOUNTER — Encounter (HOSPITAL_COMMUNITY): Payer: Self-pay | Admitting: Neurology

## 2024-01-06 ENCOUNTER — Other Ambulatory Visit (HOSPITAL_COMMUNITY): Payer: Self-pay

## 2024-01-06 ENCOUNTER — Encounter (HOSPITAL_COMMUNITY)
Admission: EM | Disposition: A | Discharge status: Home / Self Care | Payer: Self-pay | Source: Home / Self Care | Attending: Neurology

## 2024-01-06 ENCOUNTER — Inpatient Hospital Stay (HOSPITAL_COMMUNITY)

## 2024-01-06 ENCOUNTER — Inpatient Hospital Stay (HOSPITAL_COMMUNITY): Admitting: Certified Registered"

## 2024-01-06 DIAGNOSIS — I2510 Atherosclerotic heart disease of native coronary artery without angina pectoris: Secondary | ICD-10-CM

## 2024-01-06 DIAGNOSIS — I639 Cerebral infarction, unspecified: Secondary | ICD-10-CM

## 2024-01-06 DIAGNOSIS — I6389 Other cerebral infarction: Secondary | ICD-10-CM | POA: Diagnosis not present

## 2024-01-06 DIAGNOSIS — I63532 Cerebral infarction due to unspecified occlusion or stenosis of left posterior cerebral artery: Secondary | ICD-10-CM

## 2024-01-06 DIAGNOSIS — R29700 NIHSS score 0: Secondary | ICD-10-CM | POA: Diagnosis not present

## 2024-01-06 DIAGNOSIS — I280 Arteriovenous fistula of pulmonary vessels: Secondary | ICD-10-CM

## 2024-01-06 DIAGNOSIS — I63542 Cerebral infarction due to unspecified occlusion or stenosis of left cerebellar artery: Secondary | ICD-10-CM | POA: Diagnosis not present

## 2024-01-06 DIAGNOSIS — E119 Type 2 diabetes mellitus without complications: Secondary | ICD-10-CM

## 2024-01-06 DIAGNOSIS — I517 Cardiomegaly: Secondary | ICD-10-CM

## 2024-01-06 DIAGNOSIS — I10 Essential (primary) hypertension: Secondary | ICD-10-CM

## 2024-01-06 HISTORY — PX: LOOP RECORDER INSERTION: EP1214

## 2024-01-06 HISTORY — PX: TRANSESOPHAGEAL ECHOCARDIOGRAM (CATH LAB): EP1270

## 2024-01-06 LAB — BASIC METABOLIC PANEL WITH GFR
Anion gap: 10 (ref 5–15)
BUN: 23 mg/dL (ref 8–23)
CO2: 23 mmol/L (ref 22–32)
Calcium: 9 mg/dL (ref 8.9–10.3)
Chloride: 107 mmol/L (ref 98–111)
Creatinine, Ser: 1.32 mg/dL — ABNORMAL HIGH (ref 0.61–1.24)
GFR, Estimated: >60 mL/min (ref >60)
Glucose, Bld: 167 mg/dL — ABNORMAL HIGH (ref 70–99)
Potassium: 3.6 mmol/L (ref 3.5–5.1)
Sodium: 140 mmol/L (ref 135–145)

## 2024-01-06 LAB — ECHO TEE

## 2024-01-06 LAB — CBC
HCT: 42.5 % (ref 39.0–52.0)
Hemoglobin: 14.7 g/dL (ref 13.0–17.0)
MCH: 30.5 pg (ref 26.0–34.0)
MCHC: 34.6 g/dL (ref 30.0–36.0)
MCV: 88.2 fL (ref 80.0–100.0)
Platelets: 188 10*3/uL (ref 150–400)
RBC: 4.82 MIL/uL (ref 4.22–5.81)
RDW: 13 % (ref 11.5–15.5)
WBC: 8.8 10*3/uL (ref 4.0–10.5)
nRBC: 0 % (ref 0.0–0.2)

## 2024-01-06 LAB — GLUCOSE, CAPILLARY
Glucose-Capillary: 148 mg/dL — ABNORMAL HIGH (ref 70–99)
Glucose-Capillary: 181 mg/dL — ABNORMAL HIGH (ref 70–99)
Glucose-Capillary: 186 mg/dL — ABNORMAL HIGH (ref 70–99)

## 2024-01-06 SURGERY — TRANSESOPHAGEAL ECHOCARDIOGRAM (TEE) (CATHLAB)
Anesthesia: Monitor Anesthesia Care

## 2024-01-06 SURGERY — LOOP RECORDER INSERTION
Anesthesia: LOCAL

## 2024-01-06 MED ORDER — CLOPIDOGREL BISULFATE 75 MG PO TABS
75.0000 mg | ORAL_TABLET | Freq: Every day | ORAL | 1 refills | Status: DC
  Filled 2024-01-06: qty 30, 30d supply, fill #0

## 2024-01-06 MED ORDER — PROPOFOL 10 MG/ML IV BOLUS
INTRAVENOUS | Status: DC | PRN
  Administered 2024-01-06: 80 mg via INTRAVENOUS
  Administered 2024-01-06 (×3): 20 mg via INTRAVENOUS

## 2024-01-06 MED ORDER — LIDOCAINE-EPINEPHRINE 1 %-1:100000 IJ SOLN
INTRAMUSCULAR | Status: DC | PRN
  Administered 2024-01-06: 10 mL via INTRADERMAL

## 2024-01-06 MED ORDER — STUDY - LIBREXIA-STROKE - MILVEXIAN 25 MG OR PLACEBO TABLET (PI-SETHI): 1.0000 | ORAL_TABLET | Freq: Two times a day (BID) | ORAL | 0 refills | Status: DC

## 2024-01-06 MED ORDER — ASPIRIN 81 MG PO TBEC
81.0000 mg | DELAYED_RELEASE_TABLET | Freq: Every day | ORAL | 0 refills | Status: DC
  Filled 2024-01-06: qty 21, 21d supply, fill #0

## 2024-01-06 MED ORDER — SODIUM CHLORIDE 0.9 % IV SOLN: INTRAVENOUS | Status: DC | PRN

## 2024-01-06 MED ORDER — STUDY - LIBREXIA-STROKE - MILVEXIAN 25 MG OR PLACEBO TABLET (PI-SETHI): 1.0000 | ORAL_TABLET | Freq: Two times a day (BID) | ORAL | 1 refills | Status: DC

## 2024-01-06 MED ORDER — LIDOCAINE-EPINEPHRINE 1 %-1:100000 IJ SOLN
INTRAMUSCULAR | Status: AC
  Filled 2024-01-06: qty 1

## 2024-01-06 SURGICAL SUPPLY — 2 items
MONITOR REVEAL LINQ II (Prosthesis & Implant Heart) IMPLANT
PACK LOOP INSERTION (CUSTOM PROCEDURE TRAY) ×1 IMPLANT

## 2024-01-06 NOTE — Progress Notes (Signed)
 Cardiology Office Note    Patient Name: Tony Manning Date of Encounter: 01/06/2024  Primary Care Provider:  Donnie Galea, MD Primary Cardiologist:  None Primary Electrophysiologist: None   Past Medical History    Past Medical History:  Diagnosis Date   Anginal pain (HCC)    Basal cell carcinoma of face    Chest pain, unspecified    Chronic kidney disease    kidney stones   Coronary atherosclerosis of native coronary artery    s/p stent x5   Depression    Diabetes mellitus without mention of complication    Diverticulosis    Dyspnea    Dysrhythmia    Erectile dysfunction    Essential hypertension, benign    Family history of adverse reaction to anesthesia    GERD (gastroesophageal reflux disease)    Heart murmur    History of kidney stones    History of nephrolithiasis    Hyperlipidemia    Migraine    Myocardial infarction (HCC)    2016   Neuromuscular disorder (HCC)    Osteoarthritis    Pneumonia    Postsurgical percutaneous transluminal coronary angioplasty status    s/p stent x4   Sarcoidosis    Sleep apnea    uses CPAP   Unspecified sleep apnea    uses C-pap    History of Present Illness  Tony Manning is a 63 y.o. 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  Tony Manning followed primarily by Dr. Berry Bristol for management of CAD.  He underwent Cardiolite stress test in 2004 as an outpatient that showed abnormality.  He underwent an LHC 07/2003 with disease in distal circumflex treated with balloon angioplasty due to inability to stent and medical therapy,   He underwent repeat LHC in 11/2005 with stent placed to the RCA and LAD.  He was admitted on 06/2014 with NSTEMI that required thrombectomy of the mid RCA with PTCA and stenting of the RCA repeat stenting of the mid LAD.  He  completed a Lexiscan  Myoview  in 05/2016 that showed new ischemia and was intermediate.  He had a 2D echo completed on 01/2018 that showed EF of 55% with no significant valve abnormalities. He completed a Lexiscan  Myoview  in 10/2020 that was low risk.  He was last seen by Dr. Berry Bristol on 03/15/2023 and reported doing well but noted some fluctuations in BP.  He also noted fluctuations with dizziness and low blood pressure.  It was suspected that his symptoms were related to weight loss and amlodipine  and Imdur  were discontinued.  He was seen in the ED on 01/04/2024 with chest pain, left-sided weakness and code stroke was initiated.  CT of the head showed no acute abnormalities as well as CTA of the head and neck with streaks of artifact left thoracic inlet.  MRI showed patchy acute infarcts in the left cerebellum and SCA territory with several punctate acute ischemic foci.  He underwent TNK treatment for embolic stroke.  2D echo was completed with EF of 60 to 65% with grade 1 DD and no atrial level shunt.  He was started on ASA 81 mg and Plavix  75 mg x 3 weeks.  He underwent a TEE for further evaluation that showed mild concentric LVH with no left atrial or atrial appendage thrombus and positive bubble study with  shunting observed and septal aneurysm noted.  He underwent the successful implantation of a loop recorder.  Tony Manning presents today for posthospital follow-up with his wife.  He reports since his discharge yesterday experiencing ongoing chest pain, described as sharp and sometimes related to exertion, similar to previous episodes when stents were placed. He has shortness of breath, particularly with exertion. He has a history of sarcoidosis, which he states has resolved. He denies smoking history, except occasionally when angry. He reports headaches, which he attributes to caffeine withdrawal, as he has not consumed caffeine in four days. He is currently on Plavix  and aspirin , and is participating in a clinical  trial with a study drug. He has a history of dizziness and low blood pressure, previously managed with amlodipine  and isosorbide , which were discontinued due to side effects. He monitors his blood pressure at home and reports it was 136/72 during the visit. He uses a CPAP machine for sleep apnea, which he believes needs recalibration. 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 He is on disability full-time and is considering FMLA due to his recent health issues. Patient denies chest pain, palpitations, dyspnea, PND, orthopnea, nausea, vomiting, dizziness, syncope, edema, weight gain, or early satiety.  Discussed the use of AI scribe software for clinical note transcription with the patient, who gave verbal consent to proceed.  History of Present Illness   Review of Systems  Please see the history of present illness.    All other systems reviewed and are otherwise negative except as noted above.  Physical Exam    Wt Readings from Last 3 Encounters:  01/04/24 203 lb (92.1 kg)  11/12/23 216 lb 12.8 oz (98.3 kg)  10/11/23 213 lb (96.6 kg)   ZO:[id number] were no vitals filed for this visit.,There is no height or weight on file to calculate BMI. GEN: Well nourished, well developed in no acute distress Neck: No JVD; No carotid bruits Pulmonary: Clear to auscultation without rales, wheezing or rhonchi  Cardiovascular: Normal rate. Regular rhythm. Normal S1. Normal S2.   Murmurs: There is no murmur.  ABDOMEN: Soft, non-tender, non-distended EXTREMITIES:  No edema; No deformity   EKG/LABS/ Recent Cardiac Studies   ECG personally reviewed by me today -none completed today  Risk Assessment/Calculations:          Lab Results  Component Value Date   WBC 8.8 01/06/2024   HGB 14.7 01/06/2024   HCT 42.5 01/06/2024   MCV 88.2 01/06/2024   PLT 188 01/06/2024   Lab Results  Component Value Date   CREATININE 1.32 (H) 01/06/2024    BUN 23 01/06/2024   NA 140 01/06/2024   K 3.6 01/06/2024   CL 107 01/06/2024   CO2 23 01/06/2024   Lab Results  Component Value Date   CHOL 109 01/05/2024   HDL 42 01/05/2024   LDLCALC 41 01/05/2024   TRIG 129 01/05/2024   CHOLHDL 2.6 01/05/2024    Lab Results  Component Value Date   HGBA1C 8.4 (H) 11/12/2023   Assessment & Plan    1.  Acute left cerebellar CVA: -Recent cryptogenic stroke with left cerebellar infarction. PFO may be a contributing factor.  -Loop recorder implanted to monitor for atrial fibrillation. - Continue clopidogrel   then 75 mg daily - Patient will discontinue ASA 81 mg after 3 weeks - Monitor loop recorder data for atrial fibrillation. - Refer to structural heart specialist for further evaluation of PFO.  2.  History of CAD: - Recent chest pain warrants further evaluation. Discussed nuclear PET stress test for global heart evaluation and microvascular disease detection. Informed consent obtained. - Order nuclear PET stress test to evaluate for new blockages or microvascular disease. - Start amlodipine  5 mg daily to manage angina and monitor for side effects such as ankle edema and dizziness.  3.  History of PFO: -PFO identified as potential cause of cryptogenic stroke. Further evaluation needed to determine if closure is necessary. - Refer to structural heart specialist for evaluation and potential closure of PFO. - Continue current GDMT with ASA 81 mg and Plavix  75 mg daily  4.  Essential hypertension: -Blood pressure well-controlled at 136/72 mmHg. Recent fluctuations likely due to acute stroke management. - Monitor blood pressure at home, aiming for systolic <130 mmHg and diastolic <80 mmHg. - Adjust antihypertensive therapy as needed based on home readings.  5.  Hyperlipidemia: - Patient's LDL cholesterol was at goal at 44 - Continue Lipitor  40 mg daily  6.  DM type II: - Patient's last hemoglobin A1c was 8.4 above goal - Patient advised  to follow-up with PCP regarding medication regimen.  Disposition: Follow-up with None or APP in 3 months Informed Consent   Shared Decision Making/Informed Consent The risks [chest pain, shortness of breath, cardiac arrhythmias, dizziness, blood pressure fluctuations, myocardial infarction, stroke/transient ischemic attack, nausea, vomiting, allergic reaction, radiation exposure, metallic taste sensation and life-threatening complications (estimated to be 1 in 10,000)], benefits (risk stratification, diagnosing coronary artery disease, treatment guidance) and alternatives of a cardiac PET stress test were discussed in detail with Tony Manning and he agrees to proceed.      Signed, Francene Ing, Retha Cast, NP 01/06/2024, 12:48 PM [facility]

## 2024-01-06 NOTE — Plan of Care (Signed)
  Problem: Education: Goal: Knowledge of disease or condition will improve Outcome: Progressing   Problem: Ischemic Stroke/TIA Tissue Perfusion: Goal: Complications of ischemic stroke/TIA will be minimized Outcome: Progressing   Problem: Coping: Goal: Will verbalize positive feelings about self Outcome: Progressing

## 2024-01-06 NOTE — Anesthesia Preprocedure Evaluation (Signed)
 Anesthesia Evaluation  Patient identified by MRN, date of birth, ID band Patient awake    Reviewed: Allergy & Precautions, H&P , NPO status , Patient's Chart, lab work & pertinent test results  Airway Mallampati: II   Neck ROM: full    Dental   Pulmonary shortness of breath, sleep apnea    breath sounds clear to auscultation       Cardiovascular hypertension, + CAD, + Past MI and + Cardiac Stents   Rhythm:regular Rate:Normal     Neuro/Psych  Headaches PSYCHIATRIC DISORDERS  Depression    CVA    GI/Hepatic ,GERD  ,,  Endo/Other  diabetes, Type 2    Renal/GU Renal InsufficiencyRenal disease     Musculoskeletal  (+) Arthritis ,    Abdominal   Peds  Hematology   Anesthesia Other Findings   Reproductive/Obstetrics                             Anesthesia Physical Anesthesia Plan  ASA: 3  Anesthesia Plan: MAC   Post-op Pain Management:    Induction: Intravenous  PONV Risk Score and Plan: 1 and Propofol  infusion and Treatment may vary due to age or medical condition  Airway Management Planned: Nasal Cannula  Additional Equipment:   Intra-op Plan:   Post-operative Plan:   Informed Consent: I have reviewed the patients History and Physical, chart, labs and discussed the procedure including the risks, benefits and alternatives for the proposed anesthesia with the patient or authorized representative who has indicated his/her understanding and acceptance.     Dental advisory given  Plan Discussed with: CRNA, Anesthesiologist and Surgeon  Anesthesia Plan Comments:        Anesthesia Quick Evaluation

## 2024-01-06 NOTE — Anesthesia Postprocedure Evaluation (Signed)
 Anesthesia Post Note  Patient: Tony Manning  Procedure(s) Performed: TRANSESOPHAGEAL ECHOCARDIOGRAM     Patient location during evaluation: Endoscopy Anesthesia Type: MAC Level of consciousness: awake and alert Pain management: pain level controlled Vital Signs Assessment: post-procedure vital signs reviewed and stable Respiratory status: spontaneous breathing, nonlabored ventilation, respiratory function stable and patient connected to nasal cannula oxygen Cardiovascular status: stable and blood pressure returned to baseline Postop Assessment: no apparent nausea or vomiting Anesthetic complications: no   No notable events documented.  Last Vitals:  Vitals:   01/06/24 0900 01/06/24 0915  BP: (!) 115/57 129/75  Pulse: 69 67  Resp: 14 14  Temp:    SpO2: 93% 99%    Last Pain:  Vitals:   01/06/24 0915  TempSrc:   PainSc: 0-No pain                 Kylan Veach S

## 2024-01-06 NOTE — Transfer of Care (Signed)
 Immediate Anesthesia Transfer of Care Note  Patient: Tony Manning  Procedure(s) Performed: TRANSESOPHAGEAL ECHOCARDIOGRAM  Patient Location: PACU  Anesthesia Type:MAC  Level of Consciousness: drowsy  Airway & Oxygen Therapy: Patient Spontanous Breathing and Patient connected to nasal cannula oxygen  Post-op Assessment: Report given to RN and Post -op Vital signs reviewed and stable  Post vital signs: Reviewed and stable  Last Vitals:  Vitals Value Taken Time  BP    Temp    Pulse 74 01/06/24 0850  Resp 17 01/06/24 0850  SpO2 94 % 01/06/24 0850  Vitals shown include unfiled device data.  Last Pain:  Vitals:   01/06/24 0752  TempSrc: Temporal  PainSc: 0-No pain         Complications: No notable events documented.

## 2024-01-06 NOTE — Discharge Summary (Signed)
 Stroke Discharge Summary  Patient ID: Tony Manning   MRN: [id number]      DOB: 15-Sep-1960  Date of Admission: 01/04/2024 Date of Discharge: 01/06/2024  Attending Physician:  Stroke, Md, MD Consultant(s):    cardiology  Patient's PCP:  Donnie Galea, MD  DISCHARGE PRIMARY DIAGNOSIS:   left cerebellar (PCA and SCA) stroke as well as small infarct in right occipital pole s/p TNK, etiology: embolic pattern, cryptogenic    Secondary Diagnoses: Hypertension Hyperlipidemia Diabetes Type II CAD s/p stenting x 5 Obstructive sleep apnea on CPAP Atrial septal aneurysm with small PFO CKD  BPH  Allergies as of 01/06/2024       Reactions   Ambien [zolpidem] Other (See Comments)   Parasomnias, sleep walking   Lisinopril Cough   Metformin  And Related Other (See Comments)   GI upset with immediate release form   Tramadol  Diarrhea        Medication List     TAKE these medications    aspirin  EC 81 MG tablet Take 1 tablet (81 mg total) by mouth daily. Swallow whole. Start taking on: Jan 07, 2024   atorvastatin  40 MG tablet Commonly known as: LIPITOR  TAKE 1 TABLET BY MOUTH EVERY MORNING   carvedilol  3.125 MG tablet Commonly known as: COREG  Take 1 tablet (3.125 mg total) by mouth 2 (two) times daily.   clopidogrel  75 MG tablet Commonly known as: PLAVIX  Take 1 tablet (75 mg total) by mouth daily. Start taking on: Jan 07, 2024   cyclobenzaprine  5 MG tablet Commonly known as: FLEXERIL  TAKE 1 TABLET BY MOUTH EVERYDAY AT BEDTIME   FreeStyle Libre 3 Plus Sensor Misc Use to check blood sugar continuously. Change sensor every 15 days.   gabapentin  300 MG capsule Commonly known as: NEURONTIN  TAKE 1-2 CAPSULES BY MOUTH IN THE EVENING. MAY CAUSE SEDATION What changed:  how much to take how to take this when to take this additional instructions   LIBREXIA-STROKE milvexian or placebo 25 mg tablet Take 1 tablet by mouth 2 (two) times daily.   losartan  50 MG  tablet Commonly known as: COZAAR  Take 1 tablet (50 mg total) by mouth daily.   metFORMIN  500 MG 24 hr tablet Commonly known as: GLUCOPHAGE -XR TAKE 1 TABLET BY MOUTH 2 TIMES DAILY WITH A MEAL.   nitroGLYCERIN  0.4 MG SL tablet Commonly known as: NITROSTAT  Place 1 tablet (0.4 mg total) under the tongue every 5 (five) minutes as needed for chest pain.   ondansetron  4 MG tablet Commonly known as: ZOFRAN  Take 4 mg by mouth daily as needed for nausea or vomiting.   oxyCODONE -acetaminophen  5-325 MG tablet Commonly known as: PERCOCET/ROXICET Take 0.5-1 tablets by mouth every 6 (six) hours as needed for severe pain.   Ozempic  (1 MG/DOSE) 4 MG/3ML Sopn Generic drug: Semaglutide  (1 MG/DOSE) INJECT 1 MG AS DIRECTED ONCE A WEEK.   pantoprazole  40 MG tablet Commonly known as: PROTONIX  TAKE 1 TABLET BY MOUTH EVERY MORNING   predniSONE  10 MG tablet Commonly known as: DELTASONE  Take by mouth. Take as directed on package   tamsulosin  0.4 MG Caps capsule Commonly known as: FLOMAX  Take 2 capsules (0.8 mg total) by mouth daily. What changed:  how much to take when to take this   Tresiba  FlexTouch 100 UNIT/ML FlexTouch Pen Generic drug: insulin  degludec INJECT 40 UNITS SUBCUTANEOUSLY DAILY. Day supply 30.       LABORATORY STUDIES CBC    Component Value Date/Time   WBC 8.8 01/06/2024  0626   RBC 4.82 01/06/2024 0626   HGB 14.7 01/06/2024 0626   HCT 42.5 01/06/2024 0626   PLT 188 01/06/2024 0626   MCV 88.2 01/06/2024 0626   MCH 30.5 01/06/2024 0626   MCHC 34.6 01/06/2024 0626   RDW 13.0 01/06/2024 0626   LYMPHSABS 1.8 01/04/2024 0857   MONOABS 0.8 01/04/2024 0857   EOSABS 0.1 01/04/2024 0857   BASOSABS 0.1 01/04/2024 0857   CMP    Component Value Date/Time   NA 140 01/06/2024 0626   NA 138 09/21/2017 1156   K 3.6 01/06/2024 0626   CL 107 01/06/2024 0626   CO2 23 01/06/2024 0626   GLUCOSE 167 (H) 01/06/2024 0626   BUN 23 01/06/2024 0626   BUN 17 09/21/2017 1156    CREATININE 1.32 (H) 01/06/2024 0626   CREATININE 1.64 (H) 11/12/2023 1540   CALCIUM  9.0 01/06/2024 0626   PROT 6.9 01/04/2024 0857   PROT 7.3 09/21/2017 1156   ALBUMIN  3.8 01/04/2024 0857   ALBUMIN  4.6 09/21/2017 1156   AST 28 01/04/2024 0857   ALT 39 01/04/2024 0857   ALKPHOS 69 01/04/2024 0857   BILITOT 1.1 01/04/2024 0857   BILITOT 0.3 09/21/2017 1156   GFRNONAA >60 01/06/2024 0626   GFRAA 42 (L) 05/05/2019 0522   COAGS Lab Results  Component Value Date   INR 1.1 01/04/2024   INR 0.98 04/01/2017   INR 0.97 10/16/2016   Lipid Panel    Component Value Date/Time   CHOL 109 01/05/2024 0542   TRIG 129 01/05/2024 0542   HDL 42 01/05/2024 0542   CHOLHDL 2.6 01/05/2024 0542   VLDL 26 01/05/2024 0542   LDLCALC 41 01/05/2024 0542   LDLCALC 44 09/11/2022 1602   HgbA1C  Lab Results  Component Value Date   HGBA1C 8.4 (H) 11/12/2023   Urine Drug Screen  Drugs of Abuse     Component Value Date/Time   LABOPIA NONE DETECTED 01/05/2024 1130   COCAINSCRNUR NONE DETECTED 01/05/2024 1130   LABBENZ NONE DETECTED 01/05/2024 1130   AMPHETMU NONE DETECTED 01/05/2024 1130   THCU NONE DETECTED 01/05/2024 1130   LABBARB NONE DETECTED 01/05/2024 1130    Alcohol Level    Component Value Date/Time   [facility] <15 01/04/2024 0857   SIGNIFICANT DIAGNOSTIC STUDIES ECHO TEE Result Date: 01/06/2024    TRANSESOPHOGEAL ECHO REPORT   Patient Name:   Tony Manning Date of Exam: 01/06/2024 Medical Rec #:  [id number]           Height:       70.0 in Accession #:    [id number]          Weight:       203.0 lb Date of Birth:  07-28-1961           BSA:          2.101 m Patient Age:    62 years            BP:           155/66 mmHg Patient Gender: M                   HR:           71 bpm. Exam Location:  Inpatient Procedure: Transesophageal Echo, 3D Echo, Color Doppler and Cardiac Doppler            (Both Spectral and Color Flow Doppler were utilized during  procedure). Indications:     Stroke i63.9   History:         Patient has prior history of Echocardiogram examinations, most                  recent 01/05/2024. CAD; Risk Factors:Hypertension, Diabetes,                  Dyslipidemia and Sleep Apnea.  Sonographer:     Sherline Distel Senior RDCS Referring Phys:  Marcie Sever, NICOLE Diagnosing Phys: Gloriann Larger MD PROCEDURE: After discussion of the risks and benefits of a TEE, an informed consent was obtained from the patient. The transesophogeal probe was passed without difficulty through the esophogus of the patient. Sedation performed by different physician. The patient was monitored while under deep sedation. Anesthestetic sedation was provided intravenously by Anesthesiology: 140mg  of Propofol . The patient developed no complications during the procedure.  IMPRESSIONS  1. Left ventricular ejection fraction, by estimation, is 60 to 65%. The left ventricle has normal function. There is mild concentric left ventricular hypertrophy.  2. Right ventricular systolic function is normal. The right ventricular size is normal.  3. No left atrial/left atrial appendage thrombus was detected. The LAA emptying velocity was 51 cm/s.  4. The mitral valve is normal in structure. No evidence of mitral valve regurgitation. No evidence of mitral stenosis.  5. The aortic valve is tricuspid. Aortic valve regurgitation is not visualized. No aortic stenosis is present.  6. Mildly dilated pulmonary artery.  7. The inferior vena cava is normal in size with greater than 50% respiratory variability, suggesting right atrial pressure of 3 mmHg.  8. Evidence of atrial level shunting detected by color flow Doppler. Agitated saline contrast bubble study was positive with shunting observed within 3-6 cardiac cycles suggestive of interatrial shunt.  9. 3D performed for the EF and demonstrates Normal LVEF, 63%. FINDINGS  Left Ventricle: Left ventricular ejection fraction, by estimation, is 60 to 65%. The left ventricle has normal function. The  left ventricular internal cavity size was normal in size. There is mild concentric left ventricular hypertrophy. Right Ventricle: The right ventricular size is normal. No increase in right ventricular wall thickness. Right ventricular systolic function is normal. Left Atrium: Left atrial size was normal in size. No left atrial/left atrial appendage thrombus was detected. The LAA emptying velocity was 51 cm/s. Right Atrium: Right atrial size was normal in size. Prominent Eustachian valve. Pericardium: There is no evidence of pericardial effusion. Mitral Valve: The mitral valve is normal in structure. No evidence of mitral valve regurgitation. No evidence of mitral valve stenosis. Tricuspid Valve: The tricuspid valve is normal in structure. Tricuspid valve regurgitation is not demonstrated. No evidence of tricuspid stenosis. Aortic Valve: The aortic valve is tricuspid. Aortic valve regurgitation is not visualized. No aortic stenosis is present. Pulmonic Valve: The pulmonic valve was normal in structure. Pulmonic valve regurgitation is not visualized. No evidence of pulmonic stenosis. Aorta: The aortic root, ascending aorta, aortic arch and descending aorta are all structurally normal, with no evidence of dilitation or obstruction. Pulmonary Artery: The pulmonary artery is mildly dilated. Venous: The right upper pulmonary vein, right lower pulmonary vein, left lower pulmonary vein and left upper pulmonary vein are normal. The inferior vena cava is normal in size with greater than 50% respiratory variability, suggesting right atrial pressure of 3 mmHg. IAS/Shunts: The interatrial septum is aneurysmal. Evidence of atrial level shunting detected by color flow Doppler. Agitated saline contrast was given intravenously to evaluate for  intracardiac shunting. Agitated saline contrast bubble study was positive  with shunting observed within 3-6 cardiac cycles suggestive of interatrial shunt. Additional Comments: 3D was  performed not requiring image post processing on an independent workstation and was normal. Spectral Doppler performed.  3D Volume EF LV 3D EDV:   36.24 ml LV 3D ESV:   13.41 ml  3D Volume EF: 3D EF:        63 % Gloriann Larger MD Electronically signed by Gloriann Larger MD Signature Date/Time: 01/06/2024/9:28:42 AM    Final    EP STUDY Result Date: 01/06/2024 See surgical note for result.  VAS US  LOWER EXTREMITY VENOUS (DVT) Result Date: 01/05/2024  Lower Venous DVT Study Patient Name:  Tony Manning  Date of Exam:   01/05/2024 Medical Rec #: [id number]            Accession #:    [id number] Date of Birth: [birth year]            Patient Gender: M Patient Age:   57 years Exam Location:  [facility] Procedure:      VAS US  LOWER EXTREMITY VENOUS (DVT) Referring Phys: Margart Shears DE LA TORRE --------------------------------------------------------------------------------  Indications: Stroke.  Risk Factors: Cancer Carcinoma. Comparison Study: None. Performing Technologist: Estanislao Heimlich  Examination Guidelines: A complete evaluation includes B-mode imaging, spectral Doppler, color Doppler, and power Doppler as needed of all accessible portions of each vessel. Bilateral testing is considered an integral part of a complete examination. Limited examinations for reoccurring indications may be performed as noted. The reflux portion of the exam is performed with the patient in reverse Trendelenburg.  +---------+---------------+---------+-----------+----------+--------------+ RIGHT    CompressibilityPhasicitySpontaneityPropertiesThrombus Aging +---------+---------------+---------+-----------+----------+--------------+ CFV      Full           Yes      Yes                                 +---------+---------------+---------+-----------+----------+--------------+ SFJ      Full                                                         +---------+---------------+---------+-----------+----------+--------------+ FV Prox  Full                                                        +---------+---------------+---------+-----------+----------+--------------+ FV Mid   Full                                                        +---------+---------------+---------+-----------+----------+--------------+ FV DistalFull                                                        +---------+---------------+---------+-----------+----------+--------------+ PFV      Full                                                        +---------+---------------+---------+-----------+----------+--------------+  POP      Full           Yes      Yes                                 +---------+---------------+---------+-----------+----------+--------------+ PTV      Full                    Yes                                 +---------+---------------+---------+-----------+----------+--------------+ PERO     Full                    Yes                                 +---------+---------------+---------+-----------+----------+--------------+   +---------+---------------+---------+-----------+----------+--------------+ LEFT     CompressibilityPhasicitySpontaneityPropertiesThrombus Aging +---------+---------------+---------+-----------+----------+--------------+ CFV      Full           Yes      Yes                                 +---------+---------------+---------+-----------+----------+--------------+ SFJ      Full                                                        +---------+---------------+---------+-----------+----------+--------------+ FV Prox  Full                                                        +---------+---------------+---------+-----------+----------+--------------+ FV Mid   Full                                                         +---------+---------------+---------+-----------+----------+--------------+ FV DistalFull                                                        +---------+---------------+---------+-----------+----------+--------------+ PFV      Full                                                        +---------+---------------+---------+-----------+----------+--------------+ POP      Full           Yes      Yes                                 +---------+---------------+---------+-----------+----------+--------------+  PTV      Full                                                        +---------+---------------+---------+-----------+----------+--------------+ PERO     Full                                                        +---------+---------------+---------+-----------+----------+--------------+     Summary: BILATERAL: - No evidence of deep vein thrombosis seen in the lower extremities, bilaterally. -No evidence of popliteal cyst, bilaterally.   *See table(s) above for measurements and observations. Electronically signed by Genny Kid MD on 01/05/2024 at 8:31:31 PM.    Final    ECHOCARDIOGRAM COMPLETE Result Date: 01/05/2024    ECHOCARDIOGRAM REPORT   Patient Name:   Tony Manning Date of Exam: 01/05/2024 Medical Rec #:  [id number]           Height:       70.0 in Accession #:    [id number]          Weight:       203.0 lb Date of Birth:  [birth year]           BSA:          2.101 m Patient Age:    62 years            BP:           141/82 mmHg Patient Gender: M                   HR:           67 bpm. Exam Location:  Inpatient Procedure: 2D Echo, Cardiac Doppler and Color Doppler (Both Spectral and Color            Flow Doppler were utilized during procedure). Indications:    Stroke  History:        Patient has no prior history of Echocardiogram examinations.                 Previous Myocardial Infarction and CAD, Signs/Symptoms:Chest                 Pain; Risk Factors:Hypertension,  Diabetes and Sleep Apnea.  Sonographer:    Willey Harrier Referring Phys: Tyra Galley A WOLFE IMPRESSIONS  1. Left ventricular ejection fraction, by estimation, is 60 to 65%. The left ventricle has normal function. The left ventricle has no regional wall motion abnormalities. There is mild left ventricular hypertrophy of the septal segment. Left ventricular diastolic parameters are consistent with Grade I diastolic dysfunction (impaired relaxation).  2. Right ventricular systolic function is normal. The right ventricular size is normal.  3. The mitral valve is normal in structure. No evidence of mitral valve regurgitation. No evidence of mitral stenosis.  4. The aortic valve is tricuspid. Aortic valve regurgitation is not visualized. No aortic stenosis is present.  5. The inferior vena cava is normal in size with greater than 50% respiratory variability, suggesting right atrial pressure of 3 mmHg. FINDINGS  Left Ventricle: Left ventricular ejection fraction, by estimation, is 60 to 65%. The left ventricle has normal function. The  left ventricle has no regional wall motion abnormalities. The left ventricular internal cavity size was normal in size. There is  mild left ventricular hypertrophy of the septal segment. Left ventricular diastolic parameters are consistent with Grade I diastolic dysfunction (impaired relaxation). Right Ventricle: The right ventricular size is normal. No increase in right ventricular wall thickness. Right ventricular systolic function is normal. Left Atrium: Left atrial size was normal in size. Right Atrium: Right atrial size was normal in size. Pericardium: There is no evidence of pericardial effusion. Mitral Valve: The mitral valve is normal in structure. No evidence of mitral valve regurgitation. No evidence of mitral valve stenosis. MV peak gradient, 4.7 mmHg. The mean mitral valve gradient is 2.0 mmHg. Tricuspid Valve: The tricuspid valve is normal in structure. Tricuspid valve regurgitation  is not demonstrated. No evidence of tricuspid stenosis. Aortic Valve: The aortic valve is tricuspid. Aortic valve regurgitation is not visualized. No aortic stenosis is present. Aortic valve peak gradient measures 12.8 mmHg. Pulmonic Valve: The pulmonic valve was normal in structure. Pulmonic valve regurgitation is mild. No evidence of pulmonic stenosis. Aorta: The aortic root is normal in size and structure. Venous: The inferior vena cava is normal in size with greater than 50% respiratory variability, suggesting right atrial pressure of 3 mmHg. IAS/Shunts: No atrial level shunt detected by color flow Doppler.  LEFT VENTRICLE PLAX 2D LVIDd:         4.50 cm   Diastology LVIDs:         2.90 cm   LV e' medial:    9.90 cm/s LV PW:         0.90 cm   LV E/e' medial:  9.5 LV IVS:        1.10 cm   LV e' lateral:   12.60 cm/s LVOT diam:     2.00 cm   LV E/e' lateral: 7.5 LV SV:         84 LV SV Index:   40 LVOT Area:     3.14 cm  RIGHT VENTRICLE             IVC RV Basal diam:  4.30 cm     IVC diam: 1.40 cm RV S prime:     15.20 cm/s TAPSE (M-mode): 2.4 cm LEFT ATRIUM             Index        RIGHT ATRIUM           Index LA Vol (A2C):   40.2 ml 19.14 ml/m  RA Area:     14.10 cm LA Vol (A4C):   29.5 ml 14.04 ml/m  RA Volume:   35.50 ml  16.90 ml/m LA Biplane Vol: 37.5 ml 17.85 ml/m  AORTIC VALVE AV Area (Vmax): 2.19 cm AV Vmax:        179.00 cm/s AV Peak Grad:   12.8 mmHg LVOT Vmax:      125.00 cm/s LVOT Vmean:     88.100 cm/s LVOT VTI:       0.266 m  AORTA Ao Root diam: 3.10 cm Ao Asc diam:  3.10 cm MITRAL VALVE MV Area (PHT): 3.05 cm     SHUNTS MV Area VTI:   2.11 cm     Systemic VTI:  0.27 m MV Peak grad:  4.7 mmHg     Systemic Diam: 2.00 cm MV Mean grad:  2.0 mmHg MV Vmax:       1.08 m/s MV Vmean:  67.0 cm/s MV Decel Time: 249 msec MV E velocity: 94.30 cm/s MV A velocity: 101.00 cm/s MV E/A ratio:  0.93 Maudine Sos MD Electronically signed by Maudine Sos MD Signature Date/Time: 01/05/2024/12:13:53 PM     Final    MR BRAIN WO CONTRAST Result Date: 01/05/2024 CLINICAL DATA:  63 year old male code stroke presentation yesterday, status post T NK, proximal left vertebral artery occlusion but reconstitution by CTA. EXAM: MRI HEAD WITHOUT CONTRAST TECHNIQUE: Multiplanar, multiecho pulse sequences of the brain and surrounding structures were obtained without intravenous contrast. COMPARISON:  CT head, CTA head and neck yesterday. Brain MRI 07/29/2020. FINDINGS: Brain: Patchy restricted diffusion in the left cerebellum is primarily in the SCA territory on series 2, image 17. But there are additional punctate foci of abnormal diffusion in the posterior and lateral left cerebellar hemisphere (series 2, image 15) and also in the contralateral right occipital pole (series 2, image 21). The right cerebellar hemispheres spared. No convincing brainstem involvement. No anterior circulation restricted diffusion. No midline shift, mass effect, evidence of mass lesion, ventriculomegaly, extra-axial collection or acute intracranial hemorrhage. Cervicomedullary junction and pituitary are within normal limits. Mild T2 and FLAIR hyperintensity in the left SCA territory involvement., also at the right occipital pole. But otherwise stable gray and white matter signal throughout the brain since 2021 with minimal for age nonspecific supratentorial white matter T2 and FLAIR hyperintensity. No cortical encephalomalacia or chronic cerebral blood products identified. Deep gray nuclei and brainstem are within normal limits. Vascular: Major intracranial vascular flow voids are stable compared to the 2021 study including preserved dominant distal left vertebral artery. Skull and upper cervical spine: Negative. Visualized bone marrow signal is within normal limits. Sinuses/Orbits: Stable, negative. Other: Mastoids are clear. Visible internal auditory structures appear normal. IMPRESSION: 1. Patchy acute infarcts in the Left cerebellum SCA  territory, with several punctate additional ischemic foci elsewhere in the left cerebellar hemisphere, and also at the right occipital pole. This pattern corroborates the suspected acute left vertebral artery origin occlusion by CTA, with distal thromboembolic phenomena. 2. No associated hemorrhage or mass effect. And elsewhere stable since 2021 and largely normal for age noncontrast MRI appearance of the brain. Electronically Signed   By: Marlise Simpers M.D.   On: 01/05/2024 09:25   CT ANGIO HEAD NECK W WO CM (CODE STROKE) Result Date: 01/04/2024 CLINICAL DATA:  63 year old male code stroke. Status post T NK. Cerebellar symptoms. EXAM: CT ANGIOGRAPHY HEAD AND NECK TECHNIQUE: Multidetector CT imaging of the head and neck was performed using the standard protocol during bolus administration of intravenous contrast. Multiplanar CT image reconstructions and MIPs were obtained to evaluate the vascular anatomy. Carotid stenosis measurements (when applicable) are obtained utilizing NASCET criteria, using the distal internal carotid diameter as the denominator. RADIATION DOSE REDUCTION: This exam was performed according to the departmental dose-optimization program which includes automated exposure control, adjustment of the mA and/or kV according to patient size and/or use of iterative reconstruction technique. CONTRAST:  75 mL Omnipaque  350 COMPARISON:  Head CT 0905 hours today. Cervical spine MRI 10/19/2020.  Brain MRI 07/29/2020. FINDINGS: CTA NECK Skeleton: Previous cervical ACDF C4 through C7. ACDF hardware C4-C5. Solid appearing arthrodesis elsewhere. No acute osseous abnormality identified. Upper chest: Negative. Other neck: Negative. Aortic arch: -3 vessel arch.  Mild great vessel tortuosity. Right carotid system: Mild brachiocephalic and right CCA origin tortuosity. Mild soft plaque at the right ICA origin. No significant stenosis to the skull base. Left carotid system: Mild tortuosity. Mild to moderate  soft and  calcified plaque at the left ICA origin and bulb. No significant stenosis. Vertebral arteries: Proximal right subclavian artery appears normal. Minimal calcified plaque near the right vertebral origin with no convincing origin stenosis. Patent right vertebral artery to the skull base with no other significant plaque or stenosis. Proximal left subclavian, left innominate venous contrast bolus streak artifact. Proximal subclavian artery is patent. Left vertebral artery origin is poorly visible, and there seems to be absent enhancement of the left vertebral V1 segment, seemingly reconstituted in the proximal V2 segment at the C6-C7 level on series 6, image 318. The left vertebral artery than is patent and symmetrically enhancing to the skull base with no additional plaque or stenosis. The left vertebral artery V1 flow void seemed normal on the 2022 MRI. CTA HEAD Posterior circulation: Right vertebral artery V4 segment calcified plaque with stenosis on series 6, image 197 such that the right V4 segment distal to the patent right PICA is diminutive. This is stable compared to brain MRI flow voids in 2021. Distal left vertebral artery is dominant, supplies the basilar without stenosis. Left PICA origin remains patent. Patent basilar artery without stenosis. Patent SCA and PCA origins. Posterior communicating arteries are diminutive or absent. Bilateral PCA branches are within normal limits. Anterior circulation: Both ICA siphons are patent. Minimal left siphon plaque and no stenosis. Mild right siphon supraclinoid calcified plaque without stenosis. Patent carotid termini, MCA and ACA origins. Anterior communicating artery may be mildly fenestrated, normal variant. Bilateral ACA branches are within normal limits. Left MCA M1 segment and bifurcation are patent without stenosis. Right MCA M1 segment and bifurcation are patent without stenosis. Bilateral MCA branches are within normal limits. Venous sinuses: Patent. Anatomic  variants: Functionally dominant left vertebral artery with chronically diminutive right V4 distal to PICA. Review of the MIP images confirms the above findings IMPRESSION: 1. 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 Reconstituted proximal V2 segment, and left vertebral otherwise patent to the vertebrobasilar junction. The left vertebral is dominant and primarily supplies the basilar (#2). 2. No other large vessel occlusion. Minimal carotid atherosclerosis for age. 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 3. Chronic cervical ACDF. Salient findings discussed by telephone with Dr. Kimberley Penman on 01/04/2024 at 09:38 . Electronically Signed   By: Marlise Simpers M.D.   On: 01/04/2024 09:47   CT HEAD CODE STROKE WO CONTRAST Result Date: 01/04/2024 CLINICAL DATA:  Code stroke. 63 year old male with left side weakness. EXAM: CT HEAD WITHOUT CONTRAST TECHNIQUE: Contiguous axial images were obtained from the base of the skull through the vertex without intravenous contrast. RADIATION DOSE REDUCTION: This exam was performed according to the departmental dose-optimization program which includes automated exposure control, adjustment of the mA and/or kV according to patient size and/or use of iterative reconstruction technique. COMPARISON:  Brain MRI 07/29/2020.  Head CT 11/17/2019. FINDINGS: Brain: Cerebral volume remains normal for age. No midline shift, ventriculomegaly, mass effect, evidence of mass lesion, intracranial hemorrhage or evidence of cortically based acute infarction. Gray-white matter differentiation is within normal limits throughout the brain. Vascular: No suspicious intracranial vascular hyperdensity. Skull: Stable, intact. Sinuses/Orbits: Visualized paranasal sinuses and mastoids are stable and well aerated. Other: No gaze deviation, acute orbit or  scalp soft tissue finding. ASPECTS [facility] Stroke Program Early CT Score) Total score (0-10 with 10 being normal): 10 IMPRESSION: Stable and  normal for age noncontrast CT appearance of the brain. ASPECTS 10. These results were communicated to Dr. Lindzen at 9:12 am on 01/04/2024 by text page via the [facility] messaging system. Electronically Signed   By: Marlise Simpers M.D.   On: 01/04/2024 09:12    HISTORY OF PRESENT ILLNESS 63 y.o. patient with history of angina, basal cell carcinoma of the face, CKD stage IIIa, CAD s/p stenting x 5, depression, DM 2, HTN, heart murmur, nephrolithiasis, HLD, migraine headaches, sarcoidosis, OSA on CPAP, prior cervical discectomy s/p fusion was admitted with chest pain, dizziness, and left-sided weakness s/p TNKase .  MRI revealed infarcts in the left cerebellum as well as the right occipital pole.  NIH on admission 6.  HOSPITAL COURSE Stroke:  left cerebellar (PCA and SCA) stroke as well as small infarct in right occipital pole s/p TNK, etiology: embolic pattern, cryptogenic  Code Stroke CT head No acute abnormality. ASPECTS 10.    CTA head & neck streak artifact at left thoracic inlet but strong evidence of left vertebral artery occlusive origin occlusion with reconstitution at proximal V2 segment MRI patchy acute infarcts in the left cerebellum SCA territory with several punctate acute ischemic foci elsewhere in the left cerebellar hemisphere and at the right occipital pole 2D Echo EF 60 to 65%, grade 1 diastolic dysfunction, normal left atrial size, no atrial level shunt LE venous doppler no DVT TEE: Atrial septal aneurysm with small PFO Loop recorder placed on 5/1  LDL 41 HgbA1c 8.4 UDS neg VTE prophylaxis - SCDs aspirin  81 mg daily prior to admission, now on aspirin  81 mg daily and clopidogrel  75 mg daily for 3 weeks and then Plavix  alone. Enrolled in [place] stroke trial 01/05/24- pharmacy to dispense medication at discharge.  Therapy recommendations:  No follow up  needed  Disposition: Discharge to home  PFO with ASA TEE: Atrial septal aneurysm with small PFO Not able to perform TCD bubble study inpatient Recommend outpt TCD bubble study if needed Pt has multiple risk factor for stroke, PFO likely not his cause of stroke ROPE score 4  Pt will follow up with Dr. Berry Bristol tomorrow for further discussion  Hypertension Home meds: Losartan  50 mg daily, carvedilol  3.125 mg daily Stable Long term BP goal normotensive   Hyperlipidemia Home meds: Atorvastatin  40 mg daily, resumed in hospital LDL 41, goal < 70 Continue statin at discharge   Diabetes type II Uncontrolled Home meds: Tresiba  40 units daily, semaglutide  1 mg weekly HgbA1c 8.4, goal < 7.0 CBGs SSI Recommend close follow-up with PCP for better DM control   Other Stroke Risk Factors Coronary artery disease s/p stenting - followed with Dr. Berry Bristol Migraine headaches OSA on CPAP   Other Active Problems CKD 3a Cre 1.45 - 1.32 Sarcoidosis BPH on flomax    Hospital day # 2  DISCHARGE EXAM  PHYSICAL EXAM General:  Alert, well-nourished, well-developed patient in no acute distress Psych:  Mood and affect appropriate for situation. Calm and cooperative with exam CV: Regular rate and rhythm on monitor Respiratory:  Regular, unlabored respirations on room air GI: Abdomen soft and nontender   NEURO:  Mental Status: AA&Ox3, patient is able to give clear and coherent history Speech/Language: speech is without dysarthria or aphasia.     Cranial Nerves:  II: PERRL. Visual fields full.  III, IV, VI: EOMI. Eyelids elevate symmetrically.  V: Sensation is intact to light touch and symmetrical to face.  VII: Face is symmetrical resting and smiling VIII: Hearing intact to voice. IX, X:  Phonation is normal.  XI: Shoulder shrug 5/5. XII: Tongue protrudes midline Motor: 5/5 strength to all muscle groups tested.  Tone: is normal and bulk is normal Sensation: Intact to light touch bilaterally.   Coordination: FTN intact bilaterally, HKS: no ataxia in BLE. No drift.  Gait: Deferred   Most Recent NIH 0  DISCHARGE PLAN Disposition: Home aspirin  81 mg daily and clopidogrel  75 mg daily for secondary stroke prevention for 3 weeks then clopidogrel  75 mg daily alone in addition to [place] trial medication.  Ongoing stroke risk factor control by Primary Care Physician at time of discharge Follow-up PCP Donnie Galea, MD in 2 weeks. Follow-up in [facility] in 4-6 weeks, office to schedule an appointment.   35 minutes were spent preparing discharge.  Stevi Toberman, AGACNP-BC [facility] Pager: [phone]  ATTENDING NOTE: I reviewed above note and agree with the assessment and plan. Pt was seen and examined.   Wife at the bedside. Pt doing well, neuro intact. Had TEE showed small PFO with ASA. Not able to perform TCD bubble study today, may consider outpt. He will follow up with Dr. Berry Bristol in am for further discussion. Loop recorder placed. On DAPT and statin. Also on Librexia trial meds. Medically stable for discharge. Will follow up at [facility] in 4-6 weeks.   For detailed assessment and plan, please refer to above as I have made changes wherever appropriate.   Consuelo Denmark, MD PhD Stroke Neurology 01/06/2024 2:57 PM

## 2024-01-06 NOTE — TOC Transition Note (Signed)
 Transition of Care [facility]) - Discharge Note   Patient Details  Name: Tony Manning MRN: [id number] Date of Birth: 01-Mar-1961  [facility] CM/SW Contact:  Jonathan Neighbor, RN Phone Number: 01/06/2024, 3:42 PM   Clinical Narrative:     Pt is discharging home with self care. No follow up per therapies.  Pt has transportation home.  Final next level of care: Home/Self Care Barriers to Discharge: No Barriers Identified   Patient Goals and CMS Choice   CMS Medicare.gov Compare Post Acute Care list provided to:: Patient Choice offered to / list presented to : Patient      Discharge Placement                       Discharge Plan and Services Additional resources added to the After Visit Summary for                                       Social Drivers of Health (SDOH) Interventions SDOH Screenings   Food Insecurity: No Food Insecurity (01/04/2024)  Housing: Low Risk  (01/04/2024)  Transportation Needs: No Transportation Needs (01/04/2024)  Utilities: Not At Risk (01/04/2024)  Alcohol Screen: Low Risk  (09/20/2020)  Depression (PHQ2-9): Low Risk  (11/12/2023)  Financial Resource Strain: Low Risk  (11/04/2022)  Physical Activity: Insufficiently Active (11/04/2022)  Social Connections: Moderately Isolated (11/04/2022)  Stress: No Stress Concern Present (11/04/2022)  Tobacco Use: Medium Risk (01/06/2024)     Readmission Risk Interventions     No data to display

## 2024-01-06 NOTE — CV Procedure (Signed)
    TRANSESOPHAGEAL ECHOCARDIOGRAM   NAME:  Tony Manning    MRN: [id number] DOB:  26-Apr-1961    ADMIT DATE: 01/04/2024  INDICATIONS:   PROCEDURE:   Informed consent was obtained prior to the procedure. The risks, benefits and alternatives for the procedure were discussed and the patient comprehended these risks.  Risks include, but are not limited to, cough, sore throat, vomiting, nausea, somnolence, esophageal and stomach trauma or perforation, bleeding, low blood pressure, aspiration, pneumonia, infection, trauma to the teeth and death.    Procedural time out performed. The oropharynx was anesthetized with topical 1% benzocaine.    Anesthesia was administered by Dr. Joyce Nixon and team.     The transesophageal probe was inserted in the esophagus and stomach without difficulty and multiple views were obtained.   COMPLICATIONS:    There were no immediate complications.  KEY FINDINGS:  Atrial septal aneurysm with PFO.  Full report to follow. Further management per primary team.   Gloriann Larger, MD [place]  [facility]  8:49 AM

## 2024-01-06 NOTE — Consult Note (Addendum)
 ELECTROPHYSIOLOGY CONSULT NOTE  Patient ID: Tony Manning MRN: [id number], DOB/AGE: Sep 15, 1960   Admit date: 01/04/2024 Date of Consult: 01/06/2024  Primary Physician: Donnie Galea, MD Primary Cardiologist: None  Primary Electrophysiologist: New to None  Reason for Consultation: Cryptogenic stroke; recommendations regarding Implantable Loop Recorder Insurance: [employer]  History of Present Illness EP has been asked to evaluate PAARTH MACHO for placement of an implantable loop recorder to monitor for atrial fibrillation by Dr Christiane Cowing.  The patient was admitted on 01/04/2024 with left sided weakness.    He has undergone workup for stroke revealing:  Left cerebellar (PCA and SCA) stroke as well as small infarct in right occipital pole s/p TNK, etiology: embolic pattern, cryptogenic  Code Stroke CT head No acute abnormality. ASPECTS 10.    CTA head & neck streak artifact at left thoracic inlet but strong evidence of left vertebral artery occlusive origin occlusion with reconstitution at proximal V2 segment MRI patchy acute infarcts in the left cerebellum SCA territory with several punctate acute ischemic foci elsewhere in the left cerebellar hemisphere and at the right occipital pole 2D Echo EF 60 to 65%, grade 1 diastolic dysfunction, normal left atrial size, no atrial level shunt LE venous doppler no DVT TEE pending today LDL 41 HgbA1c 8.4 UDS neg VTE prophylaxis -Lovenox  aspirin  81 mg daily prior to admission, now on aspirin  81 mg daily and clopidogrel  75 mg daily for 3 weeks and then Plavix  alone. Karlyn Glasco also discuss about [place] stroke trial Therapy recommendations:  No follow up needed   The patient has been monitored on telemetry which has demonstrated sinus rhythm with no arrhythmias.  Inpatient stroke work-up Correna Meacham require a TEE per Neurology.   Echocardiogram as above. Lab work is reviewed.  Prior to admission, the patient denies chest pain, shortness of breath,  dizziness, palpitations, or syncope.  He is recovering from his stroke with plans to return home  at discharge.  Allergies, Past Medical, Surgical, Social, and Family Histories have been reviewed and are referenced here-in when relevant for medical decision making.   Inpatient Medications:   [MAR Hold] aspirin  EC  81 mg Oral Daily   [MAR Hold] atorvastatin   40 mg Oral q morning   [MAR Hold] carvedilol   3.125 mg Oral BID   [MAR Hold] Chlorhexidine  Gluconate Cloth  6 each Topical Daily   [MAR Hold] clopidogrel   75 mg Oral Daily   [MAR Hold] cyclobenzaprine   5 mg Oral QHS   [MAR Hold] gabapentin   600 mg Oral QHS   [MAR Hold] insulin  aspart  0-15 Units Subcutaneous TID WC   [MAR Hold] insulin  aspart  0-5 Units Subcutaneous QHS   [MAR Hold] insulin  glargine-yfgn  20 Units Subcutaneous Daily   [MAR Hold] labetalol   20 mg Intravenous Once   [MAR Hold] pantoprazole   40 mg Oral Daily   [MAR Hold] LIBREXIA-STROKE milvexian or placebo  1 tablet Oral BID   [MAR Hold] tamsulosin   0.4 mg Oral BID    Physical Exam: Vitals:   01/05/24 1814 01/05/24 2341 01/06/24 0413 01/06/24 0752  BP: (!) 154/70 130/77 128/74 (!) 158/79  Pulse: 67 (!) 59 64 63  Resp: 16 18 18 17   Temp: 97.9 F (36.6 C) 97.7 F (36.5 C) 97.7 F (36.5 C) 97.6 F (36.4 C)  TempSrc: Oral Oral Oral Temporal  SpO2: 100% 98% 95% 98%  Weight:      Height:        GEN- NAD. A&O x 3. Normal affect.  HEENT: Normocephalic, atraumatic Lungs- CTAB, Normal effort.  Heart- Regular rate and rhythm rate and rhythm. No M/G/R.  Extremities- No peripheral edema. no clubbing or cyanosis Skin- warm and dry, no rash or lesion. Neuro - No current focal deficit   12-lead ECG on arrival showed NSR in 60s (personally reviewed) All prior EKG's in EPIC reviewed with no documented atrial fibrillation  Telemetry SB/NSR 50-60s (personally reviewed)  Assessment and Plan:  1. Cryptogenic stroke The patient presents with cryptogenic stroke.  The  patient does have a TEE planned for this AM.  I spoke at length with the patient about monitoring for afib with an implantable loop recorder.  Risks, benefits, and alteratives to implantable loop recorder were discussed with the patient today.   At this time, the patient is very clear in their decision to proceed with implantable loop recorder unless TEE is abnormal. .   Wound care was reviewed with the patient (keep incision clean and dry for 3 days). Please call with questions.   Dr. Lawana Pray has seen. Plan for loop recorder this afternoon if TEE normal.   Tylene Galla, PA-C 01/06/2024 7:54 AM  I have seen and examined this patient with Michaelle Adolphus.  Agree with above, note added to reflect my findings.  Hospital left cerebellar PCP associated stroke as well as small right occipital pole stroke.  He is post TNK.  He has had recovery of this.  TEE planned for today.  Review of telemetry shows no arrhythmias.  Review of EKG showed no atrial fibrillation.  GEN: No acute distress.   Neck: No JVD Cardiac: RRR, no murmurs, rubs, or gallops.  Respiratory: normal BS bases bilaterally. GI: Soft, nontender, non-distended  MS: No edema; No deformity. Neuro:  Nonfocal  Skin: warm and dry Psych: Normal affect    Cryptogenic stroke: Patient has planned TEE for this morning.  No obvious stroke has been found thus far.  If TEE is negative, Roise Emert plan for ILR implant.  Risks and benefits of the discussed.  This include bleeding and infection.  The patient understands the risks and is agreed to the procedure.  Calianna Kim M. Rukiya Hodgkins MD 01/06/2024 8:51 AM

## 2024-01-07 ENCOUNTER — Encounter: Payer: Self-pay | Admitting: Nurse Practitioner

## 2024-01-07 ENCOUNTER — Telehealth: Payer: Self-pay

## 2024-01-07 ENCOUNTER — Ambulatory Visit: Attending: Nurse Practitioner | Admitting: Nurse Practitioner

## 2024-01-07 VITALS — BP 136/72 | HR 65 | Ht 70.0 in | Wt 204.2 lb

## 2024-01-07 DIAGNOSIS — I10 Essential (primary) hypertension: Secondary | ICD-10-CM | POA: Diagnosis not present

## 2024-01-07 DIAGNOSIS — N1831 Chronic kidney disease, stage 3a: Secondary | ICD-10-CM | POA: Diagnosis not present

## 2024-01-07 DIAGNOSIS — Q2112 Patent foramen ovale: Secondary | ICD-10-CM

## 2024-01-07 DIAGNOSIS — E782 Mixed hyperlipidemia: Secondary | ICD-10-CM

## 2024-01-07 DIAGNOSIS — I639 Cerebral infarction, unspecified: Secondary | ICD-10-CM | POA: Diagnosis not present

## 2024-01-07 DIAGNOSIS — I25118 Atherosclerotic heart disease of native coronary artery with other forms of angina pectoris: Secondary | ICD-10-CM

## 2024-01-07 DIAGNOSIS — E1122 Type 2 diabetes mellitus with diabetic chronic kidney disease: Secondary | ICD-10-CM

## 2024-01-07 MED ORDER — AMLODIPINE BESYLATE 5 MG PO TABS: 5.0000 mg | ORAL_TABLET | Freq: Every day | ORAL | 1 refills | Status: DC

## 2024-01-07 MED ORDER — NITROGLYCERIN 0.4 MG SL SUBL: 0.4000 mg | SUBLINGUAL_TABLET | SUBLINGUAL | 3 refills | Status: AC | PRN

## 2024-01-07 NOTE — Telephone Encounter (Signed)
 Scheduled the patient for PFO consult with Dr. Berry Bristol 01/17/2024. He was grateful for call and agreed with plan.

## 2024-01-07 NOTE — Patient Instructions (Addendum)
 Medication Instructions:  START Norvasc  5mg  Take 1 tablet once a day  *If you need a refill on your cardiac medications before your next appointment, please call your pharmacy*  Lab Work: None ordered If you have labs (blood work) drawn today and your tests are completely normal, you will receive your results only by: MyChart Message (if you have MyChart) OR A paper copy in the mail If you have any lab test that is abnormal or we need to change your treatment, we will call you to review the results.  Testing/Procedures: CARDIAC PET STRESS TEST  Follow-Up: At [facility], you and your health needs are our priority.  As part of our continuing mission to provide you with exceptional heart care, our providers are all part of one team.  This team includes your primary Cardiologist (physician) and Advanced Practice Providers or APPs (Physician Assistants and Nurse Practitioners) who all work together to provide you with the care you need, when you need it.  Your next appointment:   3 month(s)  Provider:   Charles Connor, NP     We recommend signing up for the patient portal called "MyChart".  Sign up information is provided on this After Visit Summary.  MyChart is used to connect with patients for Virtual Visits (Telemedicine).  Patients are able to view lab/test results, encounter notes, upcoming appointments, etc.  Non-urgent messages can be sent to your provider as well.   To learn more about what you can do with MyChart, go to [email or web address].   Other Instructions You have been referred to STRUCTURAL HEART.   CARDIAC PET SCAN INSTRUCTIONS  Please report to Radiology at the [facility] Main Entrance 30 minutes early for your test.  [address] [place], [place] [address]                         OR   Please report to Radiology at [facility] Main Entrance, medical mall, 30 mins prior to your test.  [address]  [place], [place]  How to Prepare for Your Cardiac PET/CT Stress Test:  Nothing to eat or drink, except water, 3 hours prior to arrival time.  NO caffeine/decaffeinated products, or chocolate 12 hours prior to arrival. (Please note decaffeinated beverages (teas/coffees) still contain caffeine).  If you have caffeine within 12 hours prior, the test will need to be rescheduled.  Medication instructions: Do not take erectile dysfunction medications for 72 hours prior to test (sildenafil , tadalafil) Do not take nitrates (isosorbide  mononitrate, Ranexa) the day before or day of test Do not take tamsulosin  the day before or morning of test- - DO NOT TAKE Flomax  Hold theophylline containing medications for 12 hours. Hold Dipyridamole 48 hours prior to the test.  Diabetic Preparation: If able to eat breakfast prior to 3 hour fasting, you may take all medications, including your insulin . Do not worry if you miss your breakfast dose of insulin  - start at your next meal. If you do not eat prior to 3 hour fast-Hold all diabetes (oral and insulin ) medications. Patients who wear a continuous glucose monitor MUST remove the device prior to scanning.  You may take your remaining medications with water.  NO perfume, cologne or lotion on chest or abdomen area.  Total time is 1 to 2 hours; you may want to bring reading material for the waiting time.  In preparation for your appointment, medication and supplies will be purchased.  Appointment availability is limited, so if you need to cancel or reschedule, please call the Radiology Department Scheduler at [phone] 24 hours in advance to avoid a cancellation fee of $100.00  What to Expect When you Arrive:  Once you arrive and check in for your appointment, you will be taken to a preparation room within the Radiology Department.  A technologist or Nurse will obtain your medical history, verify that you are correctly prepped for the exam, and explain  the procedure.  Afterwards, an IV will be started in your arm and electrodes will be placed on your skin for EKG monitoring during the stress portion of the exam. Then you will be escorted to the PET/CT scanner.  There, staff will get you positioned on the scanner and obtain a blood pressure and EKG.  During the exam, you will continue to be connected to the EKG and blood pressure machines.  A small, safe amount of a radioactive tracer will be injected in your IV to obtain a series of pictures of your heart along with an injection of a stress agent.    After your Exam:  It is recommended that you eat a meal and drink a caffeinated beverage to counter act any effects of the stress agent.  Drink plenty of fluids for the remainder of the day and urinate frequently for the first couple of hours after the exam.  Your doctor will inform you of your test results within 7-10 business days.  For more information and frequently asked questions, please visit our website: [email or web address]  For questions about your test or how to prepare for your test, please call: Cardiac Imaging Nurse Navigators Office: [phone]        Low-Sodium Eating Plan Salt (sodium) helps you keep a healthy balance of fluids in your body. Too much sodium can raise your blood pressure. It can also cause fluid and waste to be held in your body. Your health care provider or dietitian may recommend a low-sodium eating plan if you have high blood pressure (hypertension), kidney disease, liver disease, or heart failure. Eating less sodium can help lower your blood pressure and reduce swelling. It can also protect your heart, liver, and kidneys. What are tips for following this plan? Reading food labels  Check food labels for the amount of sodium per serving. If you eat more than one serving, you must multiply the listed amount by the number of servings. Choose foods with less than 140 milligrams (mg) of sodium  per serving. Avoid foods with 300 mg of sodium or more per serving. Always check how much sodium is in a product, even if the label says "unsalted" or "no salt added." Shopping  Buy products labeled as "low-sodium" or "no salt added." Buy fresh foods. Avoid canned foods and pre-made or frozen meals. Avoid canned, cured, or processed meats. Buy breads that have less than 80 mg of sodium per slice. Cooking  Eat more home-cooked food. Try to eat less restaurant, buffet, and fast food. Try not to add salt when you cook. Use salt-free seasonings or herbs instead of table salt or sea salt. Check with your provider or pharmacist before using salt substitutes. Cook with plant-based oils, such as canola, sunflower, or olive oil. Meal planning When eating at a restaurant, ask if your food can be made with less salt or no salt. Avoid dishes labeled as brined, pickled, cured, or smoked. Avoid dishes made with soy sauce, miso, or teriyaki sauce. Avoid foods that have monosodium  glutamate (MSG) in them. MSG may be added to some restaurant food, sauces, soups, bouillon, and canned foods. Make meals that can be grilled, baked, poached, roasted, or steamed. These are often made with less sodium. General information Try to limit your sodium intake to 1,500-2,300 mg each day, or the amount told by your provider. What foods should I eat? Fruits Fresh, frozen, or canned fruit. Fruit juice. Vegetables Fresh or frozen vegetables. "No salt added" canned vegetables. "No salt added" tomato sauce and paste. Low-sodium or reduced-sodium tomato and vegetable juice. Grains Low-sodium cereals, such as oats, puffed wheat and rice, and shredded wheat. Low-sodium crackers. Unsalted rice. Unsalted pasta. Low-sodium bread. Whole grain breads and whole grain pasta. Meats and other proteins Fresh or frozen meat, poultry, seafood, and fish. These should have no added salt. Low-sodium canned tuna and salmon. Unsalted nuts.  Dried peas, beans, and lentils without added salt. Unsalted canned beans. Eggs. Unsalted nut butters. Dairy Milk. Soy milk. Cheese that is naturally low in sodium, such as ricotta cheese, fresh mozzarella, or Swiss cheese. Low-sodium or reduced-sodium cheese. Cream cheese. Yogurt. Seasonings and condiments Fresh and dried herbs and spices. Salt-free seasonings. Low-sodium mustard and ketchup. Sodium-free salad dressing. Sodium-free light mayonnaise. Fresh or refrigerated horseradish. Lemon juice. Vinegar. Other foods Homemade, reduced-sodium, or low-sodium soups. Unsalted popcorn and pretzels. Low-salt or salt-free chips. The items listed above may not be all the foods and drinks you can have. Talk to a dietitian to learn more. What foods should I avoid? Vegetables Sauerkraut, pickled vegetables, and relishes. Olives. [place] fries. Onion rings. Regular canned vegetables, except low-sodium or reduced-sodium items. Regular canned tomato sauce and paste. Regular tomato and vegetable juice. Frozen vegetables in sauces. Grains Instant hot cereals. Bread stuffing, pancake, and biscuit mixes. Croutons. Seasoned rice or pasta mixes. Noodle soup cups. Boxed or frozen macaroni and cheese. Regular salted crackers. Self-rising flour. Meats and other proteins Meat or fish that is salted, canned, smoked, spiced, or pickled. Precooked or cured meat, such as sausages or meat loaves. Helene Loader. Ham. Pepperoni. Hot dogs. Corned beef. Chipped beef. Salt pork. Jerky. Pickled herring, anchovies, and sardines. Regular canned tuna. Salted nuts. Dairy Processed cheese and cheese spreads. Hard cheeses. Cheese curds. Blue cheese. Feta cheese. String cheese. Regular cottage cheese. Buttermilk. Canned milk. Fats and oils Salted butter. Regular margarine. Ghee. Bacon fat. Seasonings and condiments Onion salt, garlic salt, seasoned salt, table salt, and sea salt. Canned and packaged gravies. Worcestershire sauce. Tartar sauce.  Barbecue sauce. Teriyaki sauce. Soy sauce, including reduced-sodium soy sauce. Steak sauce. Fish sauce. Oyster sauce. Cocktail sauce. Horseradish that you find on the shelf. Regular ketchup and mustard. Meat flavorings and tenderizers. Bouillon cubes. Hot sauce. Pre-made or packaged marinades. Pre-made or packaged taco seasonings. Relishes. Regular salad dressings. Salsa. Other foods Salted popcorn and pretzels. Corn chips and puffs. Potato and tortilla chips. Canned or dried soups. Pizza. Frozen entrees and pot pies. The items listed above may not be all the foods and drinks you should avoid. Talk to a dietitian to learn more. This information is not intended to replace advice given to you by your health care provider. Make sure you discuss any questions you have with your health care provider. Document Revised: 09/10/2022 Document Reviewed: 09/10/2022 Elsevier Patient Education  2024 [employer] Inc[employer]   Heart-Healthy Eating Plan Eating a healthy diet is important for the health of your heart. A heart-healthy eating plan includes: Eating less unhealthy fats. Eating more healthy fats. Eating less salt in your food.  Salt is also called sodium. Making other changes in your diet. Talk with your doctor or a diet specialist (dietitian) to create an eating plan that is right for you. What is my plan? Your doctor may recommend an eating plan that includes: Total fat: ______% or less of total calories a day. Saturated fat: ______% or less of total calories a day. Cholesterol: less than _________mg a day. Sodium: less than _________mg a day. What are tips for following this plan? Cooking Avoid frying your food. Try to bake, boil, grill, or broil it instead. You can also reduce fat by: Removing the skin from poultry. Removing all visible fats from meats. Steaming vegetables in water or broth. Meal planning  At meals, divide your plate into four equal parts: Fill one-half of your plate with  vegetables and green salads. Fill one-fourth of your plate with whole grains. Fill one-fourth of your plate with lean protein foods. Eat 2-4 cups of vegetables per day. One cup of vegetables is: 1 cup (91 g) broccoli or cauliflower florets. 2 medium carrots. 1 large bell pepper. 1 large sweet potato. 1 large tomato. 1 medium white potato. 2 cups (150 g) raw leafy greens. Eat 1-2 cups of fruit per day. One cup of fruit is: 1 small apple 1 large banana 1 cup (237 g) mixed fruit, 1 large orange,  cup (82 g) dried fruit, 1 cup (240 mL) 100% fruit juice. Eat more foods that have soluble fiber. These are apples, broccoli, carrots, beans, peas, and barley. Try to get 20-30 g of fiber per day. Eat 4-5 servings of nuts, legumes, and seeds per week: 1 serving of dried beans or legumes equals  cup (90 g) cooked. 1 serving of nuts is  oz (12 almonds, 24 pistachios, or 7 walnut halves). 1 serving of seeds equals  oz (8 g). General information Eat more home-cooked food. Eat less restaurant, buffet, and fast food. Limit or avoid alcohol. Limit foods that are high in starch and sugar. Avoid fried foods. Lose weight if you are overweight. Keep track of how much salt (sodium) you eat. This is important if you have high blood pressure. Ask your doctor to tell you more about this. Try to add vegetarian meals each week. Fats Choose healthy fats. These include olive oil and canola oil, flaxseeds, walnuts, almonds, and seeds. Eat more omega-3 fats. These include salmon, mackerel, sardines, tuna, flaxseed oil, and ground flaxseeds. Try to eat fish at least 2 times each week. Check food labels. Avoid foods with trans fats or high amounts of saturated fat. Limit saturated fats. These are often found in animal products, such as meats, butter, and cream. These are also found in plant foods, such as palm oil, palm kernel oil, and coconut oil. Avoid foods with partially hydrogenated oils in them.  These have trans fats. Examples are stick margarine, some tub margarines, cookies, crackers, and other baked goods. What foods should I eat? Fruits All fresh, canned (in natural juice), or frozen fruits. Vegetables Fresh or frozen vegetables (raw, steamed, roasted, or grilled). Green salads. Grains Most grains. Choose whole wheat and whole grains most of the time. Rice and pasta, including brown rice and pastas made with whole wheat. Meats and other proteins Lean, well-trimmed beef, veal, pork, and lamb. Chicken and [place] without skin. All fish and shellfish. Wild duck, rabbit, pheasant, and venison. Egg whites or low-cholesterol egg substitutes. Dried beans, peas, lentils, and tofu. Seeds and most nuts. Dairy Low-fat or nonfat cheeses, including ricotta  and mozzarella. Skim or 1% milk that is liquid, powdered, or evaporated. Buttermilk that is made with low-fat milk. Nonfat or low-fat yogurt. Fats and oils Non-hydrogenated (trans-free) margarines. Vegetable oils, including soybean, sesame, sunflower, olive, peanut, safflower, corn, canola, and cottonseed. Salad dressings or mayonnaise made with a vegetable oil. Beverages Mineral water. Coffee and tea. Diet carbonated beverages. Sweets and desserts Sherbet, gelatin, and fruit ice. Small amounts of dark chocolate. Limit all sweets and desserts. Seasonings and condiments All seasonings and condiments. The items listed above may not be a complete list of foods and drinks you can eat. Contact a dietitian for more options. What foods should I avoid? Fruits Canned fruit in heavy syrup. Fruit in cream or butter sauce. Fried fruit. Limit coconut. Vegetables Vegetables cooked in cheese, cream, or butter sauce. Fried vegetables. Grains Breads that are made with saturated or trans fats, oils, or whole milk. Croissants. Sweet rolls. Donuts. High-fat crackers, such as cheese crackers. Meats and other proteins Fatty meats, such as hot dogs, ribs,  sausage, bacon, rib-eye roast or steak. High-fat deli meats, such as salami and bologna. Caviar. Domestic duck and goose. Organ meats, such as liver. Dairy Cream, sour cream, cream cheese, and creamed cottage cheese. Whole-milk cheeses. Whole or 2% milk that is liquid, evaporated, or condensed. Whole buttermilk. Cream sauce or high-fat cheese sauce. Yogurt that is made from whole milk. Fats and oils Meat fat, or shortening. Cocoa butter, hydrogenated oils, palm oil, coconut oil, palm kernel oil. Solid fats and shortenings, including bacon fat, salt pork, lard, and butter. Nondairy cream substitutes. Salad dressings with cheese or sour cream. Beverages Regular sodas and juice drinks with added sugar. Sweets and desserts Frosting. Pudding. Cookies. Cakes. Pies. Milk chocolate or white chocolate. Buttered syrups. Full-fat ice cream or ice cream drinks. The items listed above may not be a complete list of foods and drinks to avoid. Contact a dietitian for more information. Summary Heart-healthy meal planning includes eating less unhealthy fats, eating more healthy fats, and making other changes in your diet. Eat a balanced diet. This includes fruits and vegetables, low-fat or nonfat dairy, lean protein, nuts and legumes, whole grains, and heart-healthy oils and fats. This information is not intended to replace advice given to you by your health care provider. Make sure you discuss any questions you have with your health care provider. Document Revised: 09/29/2021 Document Reviewed: 09/29/2021 Elsevier Patient Education  2024 [employer].

## 2024-01-07 NOTE — Patient Instructions (Signed)
 Visit Information  Thank you for taking time to visit with me today. Please don't hesitate to contact me if I can be of assistance to you before our next scheduled telephone appointment.  Patient would like a follow up with [facility] Complex Care Coordinator, will message RN to schedule patient for follow up needs  Following is a copy of your care plan:   Goals Addressed             This Visit's Progress    [facility] Transitions of Care (TOC) Care Plan       Problems:  Recent Hospitalization for treatment of  Stroke Post hospital follow up with CCM RN  Goal:  Over the next 30 days, the patient will not experience hospital readmission  Interventions:   Stroke: Reviewed Importance of taking all medications as prescribed Advised to report any changes in symptoms or exercise tolerance Assessed for signs and symptoms of stroke  Patient Self Care Activities:  Attend all scheduled provider appointments Call provider office for new concerns or questions  Perform all self care activities independently   Plan:  The care management team will reach out to the patient again over the next 7-10 business days. The patient has been provided with contact information for the care management team and has been advised to call with any health related questions or concerns.         Patient verbalizes understanding of instructions and care plan provided today and agrees to view in MyChart. Active MyChart status and patient understanding of how to access instructions and care plan via MyChart confirmed with patient.     The patient has been provided with contact information for the care management team and has been advised to call with any health related questions or concerns.   Please call the care guide team at [phone] if you need to cancel or reschedule your appointment.   Please call the [place]  National Suicide Prevention Lifeline: 360-355-3352 or TTY: 920-090-7047 TTY (980)683-8884) to talk to  a trained counselor if you are experiencing a Mental Health or Behavioral Health Crisis or need someone to talk to. [place], RN, BSN, CCM [facility], [facility] RN Care Manager Direct Dial: [phone]

## 2024-01-07 NOTE — Telephone Encounter (Signed)
 Received referral to Structural Heart for PFO consult.  As the patient has seen Dr. Berry Bristol in the past, called to schedule appointment with him.  Left message to call back.

## 2024-01-07 NOTE — Addendum Note (Signed)
 Addended by: Ritchie Cheshire on: 01/07/2024 01:34 PM   Modules accepted: Orders

## 2024-01-07 NOTE — Transitions of Care (Post Inpatient/ED Visit) (Signed)
 01/07/2024  Name: Tony Manning MRN: [id number] DOB: 1961/08/02  Today's TOC FU Call Status: Today's TOC FU Call Status:: Successful TOC FU Call Completed TOC FU Call Complete Date: 01/07/24 Patient's Name and Date of Birth confirmed.  Transition Care Management Follow-up Telephone Call Date of Discharge: 01/06/24 Discharge Facility: [facility]) Type of Discharge: Inpatient Admission Primary Inpatient Discharge Diagnosis:: Stroke How have you been since you were released from the hospital?: Better Any questions or concerns?: No  Items Reviewed: Did you receive and understand the discharge instructions provided?: Yes Medications obtained,verified, and reconciled?: Yes (Medications Reviewed) Any new allergies since your discharge?: No Dietary orders reviewed?: Yes Type of Diet Ordered:: Heart Healthy Do you have support at home?: Yes People in Home [RPT]: spouse Name of Support/Comfort Primary Source: wife - Sheryl  Medications Reviewed Today: Medications Reviewed Today     Reviewed by Jamie Mccoy, RN (Registered Nurse) on 01/07/24 at 1516  Med List Status: <None>   Medication Order Taking? Sig Documenting Provider Last Dose Status Informant  amLODipine  (NORVASC ) 5 MG tablet [id number]  Take 1 tablet (5 mg total) by mouth daily. Gerald Kitty., NP  Active            Med Note Miriam American, Rowan Cooter   Fri Jan 07, 2024  3:09 PM) Will start tomorrow  aspirin  EC 81 MG tablet [id number] Yes Take 1 tablet (81 mg total) by mouth daily. Swallow whole. Toberman, Stevi W, NP Taking Active   atorvastatin  (LIPITOR ) 40 MG tablet [id number] Yes TAKE 1 TABLET BY MOUTH EVERY MORNING Donnie Galea, MD Taking Active Self, Pharmacy Records  carvedilol  (COREG ) 3.125 MG tablet [id number] Yes Take 1 tablet (3.125 mg total) by mouth 2 (two) times daily. Donnie Galea, MD Taking Active Self, Pharmacy Records  clopidogrel  (PLAVIX ) 75 MG tablet [id number] Yes Take 1 tablet (75 mg total)  by mouth daily. Toberman, Stevi W, NP Taking Active   Continuous Glucose Sensor (FREESTYLE LIBRE 3 PLUS SENSOR) [place] [id number] Yes Use to check blood sugar continuously. Change sensor every 15 days. Donnie Galea, MD Taking Active Self, Pharmacy Records  cyclobenzaprine  (FLEXERIL ) 5 MG tablet [id number] Yes TAKE 1 TABLET BY MOUTH EVERYDAY AT BEDTIME Donnie Galea, MD Taking Active Self, Pharmacy Records  gabapentin  (NEURONTIN ) 300 MG capsule [id number] Yes TAKE 1-2 CAPSULES BY MOUTH IN THE EVENING. MAY CAUSE SEDATION  Patient taking differently: Take 600 mg by mouth at bedtime.   Donnie Galea, MD Taking Active Self, Pharmacy Records  insulin  degludec (TRESIBA  FLEXTOUCH) 100 UNIT/ML FlexTouch Pen [id number] Yes INJECT 40 UNITS SUBCUTANEOUSLY DAILY. Day supply 30. Donnie Galea, MD Taking Active Self, Pharmacy Records  losartan  (COZAAR ) 50 MG tablet [id number] Yes Take 1 tablet (50 mg total) by mouth daily. Donnie Galea, MD Taking Active Self, Pharmacy Records  metFORMIN  (GLUCOPHAGE -XR) 500 MG 24 hr tablet [id number] Yes TAKE 1 TABLET BY MOUTH 2 TIMES DAILY WITH A MEAL. Donnie Galea, MD Taking Active Self, Pharmacy Records  nitroGLYCERIN  (NITROSTAT ) 0.4 MG SL tablet [id number] No Place 1 tablet (0.4 mg total) under the tongue every 5 (five) minutes as needed for chest pain.  Patient not taking: Reported on 01/07/2024   Gerald Kitty., NP Not Taking Active            Med Note Miriam American, Rowan Cooter   Fri Jan 07, 2024  3:12 PM) Have it and will take as needed  ondansetron  (ZOFRAN ) 4 MG tablet  [id number]  Take 4 mg by mouth daily as needed for nausea or vomiting. [provider]  Active Self, Pharmacy Records           Med Note Miriam American, Rowan Cooter   Fri Jan 07, 2024  3:13 PM) As needed only  oxyCODONE -acetaminophen  (PERCOCET/ROXICET) 5-325 MG tablet [id number]  Take 0.5-1 tablets by mouth every 6 (six) hours as needed for severe pain. [provider]  Active Self, Pharmacy  Records           Med Note [place], Rowan Cooter   Fri Jan 07, 2024  3:13 PM) Take as needed only  pantoprazole  (PROTONIX ) 40 MG tablet [id number] Yes TAKE 1 TABLET BY MOUTH EVERY MORNING Donnie Galea, MD Taking Active Self, Pharmacy Records  predniSONE  (DELTASONE ) 10 MG tablet [id number] No Take by mouth. Take as directed on package  Patient not taking: Reported on 01/07/2024   [provider] Not Taking Active Self, Pharmacy Records           Med Note (CRUTHIS, CHLOE C   Tue Jan 04, 2024 11:10 AM) Pt on a taper dose pack. Started on 12/31/23 per pt. Took 20 mg on 01/04/24.   Semaglutide , 1 MG/DOSE, (OZEMPIC , 1 MG/DOSE,) 4 MG/3ML SOPN [id number] Yes INJECT 1 MG AS DIRECTED ONCE A WEEK. Donnie Galea, MD Taking Active Self, Pharmacy Records           Med Note [place], Rowan Cooter   Fri Jan 07, 2024  3:15 PM) [place] Pear on Sunday  Study - LIBREXIA-STROKE - milvexian 25 mg or placebo tablet (PI-Sethi) [id number]  Take 1 tablet by mouth 2 (two) times daily. Contact [facility] for any questions or concerns regarding this medication. Consuelo Denmark, MD  Active   tamsulosin  (FLOMAX ) 0.4 MG CAPS capsule [id number] Yes Take 2 capsules (0.8 mg total) by mouth daily.  Patient taking differently: Take 0.4 mg by mouth in the morning and at bedtime.   Donnie Galea, MD Taking Active Self, Pharmacy Records            Home Care and Equipment/Supplies: Were Home Health Services Ordered?: No Any new equipment or medical supplies ordered?: No  Functional Questionnaire: Do you need assistance with bathing/showering or dressing?: No Do you need assistance with meal preparation?: No Do you need assistance with eating?: No Do you have difficulty maintaining continence: No Do you need assistance with getting out of bed/getting out of a chair/moving?: No Do you have difficulty managing or taking your medications?: No  Follow up appointments reviewed: PCP Follow-up appointment  confirmed?: Yes Date of PCP follow-up appointment?: 01/10/24 Follow-up Provider: Dr. Vallarie Gauze [facility] Follow-up appointment confirmed?: Yes Date of Specialist follow-up appointment?: 01/13/24 Follow-Up Specialty Provider:: Pulmonologist - [place] Do you need transportation to your follow-up appointment?: No Do you understand care options if your condition(s) worsen?: Yes-patient verbalized understanding  SDOH Interventions Today    Flowsheet Row Most Recent Value  SDOH Interventions   Food Insecurity Interventions Intervention Not Indicated  Housing Interventions Intervention Not Indicated  Transportation Interventions Intervention Not Indicated  Utilities Interventions Intervention Not Indicated       Goals Addressed             This Visit's Progress    VBCI Transitions of Care (TOC) Care Plan       Problems:  Recent Hospitalization for treatment of  Stroke Post hospital follow up with CCM RN  Goal:  Over the next 30 days, the  patient will not experience hospital readmission  Interventions:   Stroke: Reviewed Importance of taking all medications as prescribed Advised to report any changes in symptoms or exercise tolerance Assessed for signs and symptoms of stroke  Patient Self Care Activities:  Attend all scheduled provider appointments Call provider office for new concerns or questions  Perform all self care activities independently   Plan:  The care management team will reach out to the patient again over the next 7-10 business days. The patient has been provided with contact information for the care management team and has been advised to call with any health related questions or concerns.          [place], RN, BSN, CCM [facility], [facility] RN Care Manager Direct Dial: [phone]

## 2024-01-09 ENCOUNTER — Other Ambulatory Visit: Payer: Self-pay | Admitting: Family Medicine

## 2024-01-09 DIAGNOSIS — E1149 Type 2 diabetes mellitus with other diabetic neurological complication: Secondary | ICD-10-CM

## 2024-01-10 ENCOUNTER — Ambulatory Visit (INDEPENDENT_AMBULATORY_CARE_PROVIDER_SITE_OTHER): Admitting: Family Medicine

## 2024-01-10 ENCOUNTER — Encounter: Payer: Self-pay | Admitting: Family Medicine

## 2024-01-10 VITALS — BP 136/74 | HR 72 | Temp 98.2°F | Ht 70.0 in | Wt 201.2 lb

## 2024-01-10 DIAGNOSIS — M533 Sacrococcygeal disorders, not elsewhere classified: Secondary | ICD-10-CM | POA: Insufficient documentation

## 2024-01-10 DIAGNOSIS — Z794 Long term (current) use of insulin: Secondary | ICD-10-CM

## 2024-01-10 DIAGNOSIS — Z8673 Personal history of transient ischemic attack (TIA), and cerebral infarction without residual deficits: Secondary | ICD-10-CM

## 2024-01-10 DIAGNOSIS — E1149 Type 2 diabetes mellitus with other diabetic neurological complication: Secondary | ICD-10-CM | POA: Diagnosis not present

## 2024-01-10 DIAGNOSIS — I10 Essential (primary) hypertension: Secondary | ICD-10-CM | POA: Diagnosis not present

## 2024-01-10 NOTE — Patient Instructions (Addendum)
 Don't change your meds for now and update me about your sugar next week.  If you stay lightheaded, then cut the amlodipine  in half.  Take care.  Glad to see you. I'll await the notes from neurology and cardiology.    Recheck A1c at a visit in about 3 months.

## 2024-01-10 NOTE — Progress Notes (Unsigned)
 Inpatient f/u.  Inpatient course discussed with patient. 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 occipital pole s/p TNK, etiology: embolic pattern, cryptogenic     Secondary Diagnoses: Hypertension Hyperlipidemia Diabetes Type II CAD s/p stenting x 5 Obstructive sleep apnea on CPAP Atrial septal aneurysm with small PFO CKD  BPH   Allergies as of 01/06/2024         Reactions    Ambien [zolpidem] Other (See Comments)    Parasomnias, sleep walking    Lisinopril Cough    Metformin  And Related Other (See Comments)    GI upset with immediate release form    Tramadol  Diarrhea            Medication List       TAKE these medications     aspirin  EC 81 MG tablet Take 1 tablet (81 mg total) by mouth daily. Swallow whole. Start taking on: Jan 07, 2024    atorvastatin  40 MG tablet Commonly known as: LIPITOR  TAKE 1 TABLET BY MOUTH EVERY MORNING    carvedilol  3.125 MG tablet Commonly known as: COREG  Take 1 tablet (3.125 mg total) by mouth 2 (two) times daily.    clopidogrel  75 MG tablet Commonly known as: PLAVIX  Take 1 tablet (75 mg total) by mouth daily. Start taking on: Jan 07, 2024    cyclobenzaprine  5 MG tablet Commonly known as: FLEXERIL  TAKE 1 TABLET BY MOUTH EVERYDAY AT BEDTIME    FreeStyle Libre 3 Plus Sensor Misc Use to check blood sugar continuously. Change sensor every 15 days.    gabapentin  300 MG capsule Commonly known as: NEURONTIN  TAKE 1-2 CAPSULES BY MOUTH IN THE EVENING. MAY CAUSE SEDATION What changed:  how much to take how to take this when to take this additional instructions    LIBREXIA-STROKE milvexian or placebo 25 mg tablet Take 1 tablet by mouth 2 (two) times daily.    losartan  50 MG  tablet Commonly known as: COZAAR  Take 1 tablet (50 mg total) by mouth daily.    metFORMIN  500 MG 24 hr tablet Commonly known as: GLUCOPHAGE -XR TAKE 1 TABLET BY MOUTH 2 TIMES DAILY WITH A MEAL.    nitroGLYCERIN  0.4 MG SL tablet Commonly known as: NITROSTAT  Place 1 tablet (0.4 mg total) under the tongue every 5 (five) minutes as needed for chest pain.    ondansetron  4 MG tablet Commonly known as: ZOFRAN  Take 4 mg by mouth daily as needed for nausea or vomiting.    oxyCODONE -acetaminophen  5-325 MG tablet Commonly known as: PERCOCET/ROXICET Take 0.5-1 tablets by mouth every 6 (six) hours as needed for severe pain.    Ozempic  (1 MG/DOSE) 4 MG/3ML Sopn Generic drug: Semaglutide  (1 MG/DOSE) INJECT 1 MG AS DIRECTED ONCE A WEEK.    pantoprazole  40 MG tablet Commonly known as: PROTONIX  TAKE 1 TABLET BY MOUTH EVERY MORNING    predniSONE  10 MG tablet Commonly known as: DELTASONE  Take by mouth. Take as directed on package    tamsulosin  0.4 MG Caps capsule Commonly known as: FLOMAX  Take 2 capsules (0.8 mg total) by mouth daily. What changed:  how much to take when to take this    Tresiba  FlexTouch 100 UNIT/ML FlexTouch Pen Generic drug: insulin  degludec INJECT 40 UNITS SUBCUTANEOUSLY DAILY. Day supply  30.           LABORATORY STUDIES CBC Labs (Brief)          Component Value Date/Time    WBC 8.8 01/06/2024 0626    RBC 4.82 01/06/2024 0626    HGB 14.7 01/06/2024 0626    HCT 42.5 01/06/2024 0626    PLT 188 01/06/2024 0626    MCV 88.2 01/06/2024 0626    MCH 30.5 01/06/2024 0626    MCHC 34.6 01/06/2024 0626    RDW 13.0 01/06/2024 0626    LYMPHSABS 1.8 01/04/2024 0857    MONOABS 0.8 01/04/2024 0857    EOSABS 0.1 01/04/2024 0857    BASOSABS 0.1 01/04/2024 0857      CMP Labs (Brief)          Component Value Date/Time    NA 140 01/06/2024 0626    NA 138 09/21/2017 1156    K 3.6 01/06/2024 0626    CL 107 01/06/2024 0626    CO2 23 01/06/2024 0626    GLUCOSE 167  (H) 01/06/2024 0626    BUN 23 01/06/2024 0626    BUN 17 09/21/2017 1156    CREATININE 1.32 (H) 01/06/2024 0626    CREATININE 1.64 (H) 11/12/2023 1540    CALCIUM  9.0 01/06/2024 0626    PROT 6.9 01/04/2024 0857    PROT 7.3 09/21/2017 1156    ALBUMIN  3.8 01/04/2024 0857    ALBUMIN  4.6 09/21/2017 1156    AST 28 01/04/2024 0857    ALT 39 01/04/2024 0857    ALKPHOS 69 01/04/2024 0857    BILITOT 1.1 01/04/2024 0857    BILITOT 0.3 09/21/2017 1156    GFRNONAA >60 01/06/2024 0626    GFRAA 42 (L) 05/05/2019 0522      COAGS Recent Labs       Lab Results  Component Value Date    INR 1.1 01/04/2024    INR 0.98 04/01/2017    INR 0.97 10/16/2016      Lipid Panel Labs (Brief)          Component Value Date/Time    CHOL 109 01/05/2024 0542    TRIG 129 01/05/2024 0542    HDL 42 01/05/2024 0542    CHOLHDL 2.6 01/05/2024 0542    VLDL 26 01/05/2024 0542    LDLCALC 41 01/05/2024 0542    LDLCALC 44 09/11/2022 1602      HgbA1C  Recent Labs       Lab Results  Component Value Date    HGBA1C 8.4 (H) 11/12/2023      Urine Drug Screen  Drugs of Abuse  Labs (Brief)          Component Value Date/Time    LABOPIA NONE DETECTED 01/05/2024 1130    COCAINSCRNUR NONE DETECTED 01/05/2024 1130    LABBENZ NONE DETECTED 01/05/2024 1130    AMPHETMU NONE DETECTED 01/05/2024 1130    THCU NONE DETECTED 01/05/2024 1130    LABBARB NONE DETECTED 01/05/2024 1130      Alcohol Level Labs (Brief)          Component Value Date/Time    [facility] <15 01/04/2024 0857      SIGNIFICANT DIAGNOSTIC STUDIES  Imaging Results  ECHO TEE Result Date: 01/06/2024    TRANSESOPHOGEAL ECHO REPORT   Patient Name:   ROYEL BORSTAD Date of Exam: 01/06/2024 Medical Rec #:  [id number]           Height:       70.0 in Accession #:  [id number]          Weight:       203.0 lb Date of Birth:  [birth year]           BSA:          2.101 m Patient Age:    63 years            BP:           155/66 mmHg Patient Gender: M                    HR:           71 bpm. Exam Location:  Inpatient Procedure: Transesophageal Echo, 3D Echo, Color Doppler and Cardiac Doppler            (Both Spectral and Color Flow Doppler were utilized during            procedure). Indications:     Stroke i63.9  History:         Patient has prior history of Echocardiogram examinations, most                  recent 01/05/2024. CAD; Risk Factors:Hypertension, Diabetes,                  Dyslipidemia and Sleep Apnea.  Sonographer:     Sherline Distel Senior RDCS Referring Phys:  Marcie Sever, NICOLE Diagnosing Phys: Gloriann Larger MD PROCEDURE: After discussion of the risks and benefits of a TEE, an informed consent was obtained from the patient. The transesophogeal probe was passed without difficulty through the esophogus of the patient. Sedation performed by different physician. The patient was monitored while under deep sedation. Anesthestetic sedation was provided intravenously by Anesthesiology: 140mg  of Propofol . The patient developed no complications during the procedure.  IMPRESSIONS  1. Left ventricular ejection fraction, by estimation, is 60 to 65%. The left ventricle has normal function. There is mild concentric left ventricular hypertrophy.  2. Right ventricular systolic function is normal. The right ventricular size is normal.  3. No left atrial/left atrial appendage thrombus was detected. The LAA emptying velocity was 51 cm/s.  4. The mitral valve is normal in structure. No evidence of mitral valve regurgitation. No evidence of mitral stenosis.  5. The aortic valve is tricuspid. Aortic valve regurgitation is not visualized. No aortic stenosis is present.  6. Mildly dilated pulmonary artery.  7. The inferior vena cava is normal in size with greater than 50% respiratory variability, suggesting right atrial pressure of 3 mmHg.  8. Evidence of atrial level shunting detected by color flow Doppler. Agitated saline contrast bubble study was positive with shunting observed  within 3-6 cardiac cycles suggestive of interatrial shunt.  9. 3D performed for the EF and demonstrates Normal LVEF, 63%. FINDINGS  Left Ventricle: Left ventricular ejection fraction, by estimation, is 60 to 65%. The left ventricle has normal function. The left ventricular internal cavity size was normal in size. There is mild concentric left ventricular hypertrophy. Right Ventricle: The right ventricular size is normal. No increase in right ventricular wall thickness. Right ventricular systolic function is normal. Left Atrium: Left atrial size was normal in size. No left atrial/left atrial appendage thrombus was detected. The LAA emptying velocity was 51 cm/s. Right Atrium: Right atrial size was normal in size. Prominent Eustachian valve. Pericardium: There is no evidence of pericardial effusion. Mitral Valve: The mitral valve is normal in structure. No evidence of mitral valve regurgitation. No evidence of mitral valve  stenosis. Tricuspid Valve: The tricuspid valve is normal in structure. Tricuspid valve regurgitation is not demonstrated. No evidence of tricuspid stenosis. Aortic Valve: The aortic valve is tricuspid. Aortic valve regurgitation is not visualized. No aortic stenosis is present. Pulmonic Valve: The pulmonic valve was normal in structure. Pulmonic valve regurgitation is not visualized. No evidence of pulmonic stenosis. Aorta: The aortic root, ascending aorta, aortic arch and descending aorta are all structurally normal, with no evidence of dilitation or obstruction. Pulmonary Artery: The pulmonary artery is mildly dilated. Venous: The right upper pulmonary vein, right lower pulmonary vein, left lower pulmonary vein and left upper pulmonary vein are normal. The inferior vena cava is normal in size with greater than 50% respiratory variability, suggesting right atrial pressure of 3 mmHg. IAS/Shunts: The interatrial septum is aneurysmal. Evidence of atrial level shunting detected by color flow Doppler.  Agitated saline contrast was given intravenously to evaluate for intracardiac shunting. Agitated saline contrast bubble study was positive  with shunting observed within 3-6 cardiac cycles suggestive of interatrial shunt. Additional Comments: 3D was performed not requiring image post processing on an independent workstation and was normal. Spectral Doppler performed.  3D Volume EF LV 3D EDV:   36.24 ml LV 3D ESV:   13.41 ml  3D Volume EF: 3D EF:        63 % Gloriann Larger MD Electronically signed by Gloriann Larger MD Signature Date/Time: 01/06/2024/9:28:42 AM    Final     EP STUDY Result Date: 01/06/2024 See surgical note for result.   VAS US  LOWER EXTREMITY VENOUS (DVT) Result Date: 01/05/2024  Lower Venous DVT Study Patient Name:  Tony Manning  Date of Exam:   01/05/2024 Medical Rec #: [id number]            Accession #:    [id number] Date of Birth: 07/24/1961            Patient Gender: M Patient Age:   49 years Exam Location:  [facility] Procedure:      VAS US  LOWER EXTREMITY VENOUS (DVT) Referring Phys: Margart Shears DE LA TORRE --------------------------------------------------------------------------------  Indications: Stroke.  Risk Factors: Cancer Carcinoma. Comparison Study: None. Performing Technologist: Estanislao Heimlich  Examination Guidelines: A complete evaluation includes B-mode imaging, spectral Doppler, color Doppler, and power Doppler as needed of all accessible portions of each vessel. Bilateral testing is considered an integral part of a complete examination. Limited examinations for reoccurring indications may be performed as noted. The reflux portion of the exam is performed with the patient in reverse Trendelenburg.  +---------+---------------+---------+-----------+----------+--------------+ RIGHT    CompressibilityPhasicitySpontaneityPropertiesThrombus Aging +---------+---------------+---------+-----------+----------+--------------+ CFV      Full           Yes       Yes                                 +---------+---------------+---------+-----------+----------+--------------+ SFJ      Full                                                        +---------+---------------+---------+-----------+----------+--------------+ FV Prox  Full                                                        +---------+---------------+---------+-----------+----------+--------------+  FV Mid   Full                                                        +---------+---------------+---------+-----------+----------+--------------+ FV DistalFull                                                        +---------+---------------+---------+-----------+----------+--------------+ PFV      Full                                                        +---------+---------------+---------+-----------+----------+--------------+ POP      Full           Yes      Yes                                 +---------+---------------+---------+-----------+----------+--------------+ PTV      Full                    Yes                                 +---------+---------------+---------+-----------+----------+--------------+ PERO     Full                    Yes                                 +---------+---------------+---------+-----------+----------+--------------+   +---------+---------------+---------+-----------+----------+--------------+ LEFT     CompressibilityPhasicitySpontaneityPropertiesThrombus Aging +---------+---------------+---------+-----------+----------+--------------+ CFV      Full           Yes      Yes                                 +---------+---------------+---------+-----------+----------+--------------+ SFJ      Full                                                        +---------+---------------+---------+-----------+----------+--------------+ FV Prox  Full                                                         +---------+---------------+---------+-----------+----------+--------------+ FV Mid   Full                                                        +---------+---------------+---------+-----------+----------+--------------+  FV DistalFull                                                        +---------+---------------+---------+-----------+----------+--------------+ PFV      Full                                                        +---------+---------------+---------+-----------+----------+--------------+ POP      Full           Yes      Yes                                 +---------+---------------+---------+-----------+----------+--------------+ PTV      Full                                                        +---------+---------------+---------+-----------+----------+--------------+ PERO     Full                                                        +---------+---------------+---------+-----------+----------+--------------+     Summary: BILATERAL: - No evidence of deep vein thrombosis seen in the lower extremities, bilaterally. -No evidence of popliteal cyst, bilaterally.   *See table(s) above for measurements and observations. Electronically signed by Genny Kid MD on 01/05/2024 at 8:31:31 PM.    Final     ECHOCARDIOGRAM COMPLETE Result Date: 01/05/2024    ECHOCARDIOGRAM REPORT   Patient Name:   BERNIS NEAVE Date of Exam: 01/05/2024 Medical Rec #:  [id number]           Height:       70.0 in Accession #:    [id number]          Weight:       203.0 lb Date of Birth:  [birth year]           BSA:          2.101 m Patient Age:    63 years            BP:           141/82 mmHg Patient Gender: M                   HR:           67 bpm. Exam Location:  Inpatient Procedure: 2D Echo, Cardiac Doppler and Color Doppler (Both Spectral and Color            Flow Doppler were utilized during procedure). Indications:    Stroke  History:        Patient has no prior history of  Echocardiogram examinations.                 Previous Myocardial Infarction and CAD, Signs/Symptoms:Chest  Pain; Risk Factors:Hypertension, Diabetes and Sleep Apnea.  Sonographer:    Willey Harrier Referring Phys: Tyra Galley A WOLFE IMPRESSIONS  1. Left ventricular ejection fraction, by estimation, is 60 to 65%. The left ventricle has normal function. The left ventricle has no regional wall motion abnormalities. There is mild left ventricular hypertrophy of the septal segment. Left ventricular diastolic parameters are consistent with Grade I diastolic dysfunction (impaired relaxation).  2. Right ventricular systolic function is normal. The right ventricular size is normal.  3. The mitral valve is normal in structure. No evidence of mitral valve regurgitation. No evidence of mitral stenosis.  4. The aortic valve is tricuspid. Aortic valve regurgitation is not visualized. No aortic stenosis is present.  5. The inferior vena cava is normal in size with greater than 50% respiratory variability, suggesting right atrial pressure of 3 mmHg. FINDINGS  Left Ventricle: Left ventricular ejection fraction, by estimation, is 60 to 65%. The left ventricle has normal function. The left ventricle has no regional wall motion abnormalities. The left ventricular internal cavity size was normal in size. There is  mild left ventricular hypertrophy of the septal segment. Left ventricular diastolic parameters are consistent with Grade I diastolic dysfunction (impaired relaxation). Right Ventricle: The right ventricular size is normal. No increase in right ventricular wall thickness. Right ventricular systolic function is normal. Left Atrium: Left atrial size was normal in size. Right Atrium: Right atrial size was normal in size. Pericardium: There is no evidence of pericardial effusion. Mitral Valve: The mitral valve is normal in structure. No evidence of mitral valve regurgitation. No evidence of mitral valve stenosis. MV peak  gradient, 4.7 mmHg. The mean mitral valve gradient is 2.0 mmHg. Tricuspid Valve: The tricuspid valve is normal in structure. Tricuspid valve regurgitation is not demonstrated. No evidence of tricuspid stenosis. Aortic Valve: The aortic valve is tricuspid. Aortic valve regurgitation is not visualized. No aortic stenosis is present. Aortic valve peak gradient measures 12.8 mmHg. Pulmonic Valve: The pulmonic valve was normal in structure. Pulmonic valve regurgitation is mild. No evidence of pulmonic stenosis. Aorta: The aortic root is normal in size and structure. Venous: The inferior vena cava is normal in size with greater than 50% respiratory variability, suggesting right atrial pressure of 3 mmHg. IAS/Shunts: No atrial level shunt detected by color flow Doppler.  LEFT VENTRICLE PLAX 2D LVIDd:         4.50 cm   Diastology LVIDs:         2.90 cm   LV e' medial:    9.90 cm/s LV PW:         0.90 cm   LV E/e' medial:  9.5 LV IVS:        1.10 cm   LV e' lateral:   12.60 cm/s LVOT diam:     2.00 cm   LV E/e' lateral: 7.5 LV SV:         84 LV SV Index:   40 LVOT Area:     3.14 cm  RIGHT VENTRICLE             IVC RV Basal diam:  4.30 cm     IVC diam: 1.40 cm RV S prime:     15.20 cm/s TAPSE (M-mode): 2.4 cm LEFT ATRIUM             Index        RIGHT ATRIUM           Index LA Vol (A2C):   40.2 ml 19.14  ml/m  RA Area:     14.10 cm LA Vol (A4C):   29.5 ml 14.04 ml/m  RA Volume:   35.50 ml  16.90 ml/m LA Biplane Vol: 37.5 ml 17.85 ml/m  AORTIC VALVE AV Area (Vmax): 2.19 cm AV Vmax:        179.00 cm/s AV Peak Grad:   12.8 mmHg LVOT Vmax:      125.00 cm/s LVOT Vmean:     88.100 cm/s LVOT VTI:       0.266 m  AORTA Ao Root diam: 3.10 cm Ao Asc diam:  3.10 cm MITRAL VALVE MV Area (PHT): 3.05 cm     SHUNTS MV Area VTI:   2.11 cm     Systemic VTI:  0.27 m MV Peak grad:  4.7 mmHg     Systemic Diam: 2.00 cm MV Mean grad:  2.0 mmHg MV Vmax:       1.08 m/s MV Vmean:      67.0 cm/s MV Decel Time: 249 msec MV E velocity: 94.30 cm/s  MV A velocity: 101.00 cm/s MV E/A ratio:  0.93 Maudine Sos MD Electronically signed by Maudine Sos MD Signature Date/Time: 01/05/2024/12:13:53 PM    Final     MR BRAIN WO CONTRAST Result Date: 01/05/2024 CLINICAL DATA:  63 year old male code stroke presentation yesterday, status post T NK, proximal left vertebral artery occlusion but reconstitution by CTA. EXAM: MRI HEAD WITHOUT CONTRAST TECHNIQUE: Multiplanar, multiecho pulse sequences of the brain and surrounding structures were obtained without intravenous contrast. COMPARISON:  CT head, CTA head and neck yesterday. Brain MRI 07/29/2020. FINDINGS: Brain: Patchy restricted diffusion in the left cerebellum is primarily in the SCA territory on series 2, image 17. But there are additional punctate foci of abnormal diffusion in the posterior and lateral left cerebellar hemisphere (series 2, image 15) and also in the contralateral right occipital pole (series 2, image 21). The right cerebellar hemispheres spared. No convincing brainstem involvement. No anterior circulation restricted diffusion. No midline shift, mass effect, evidence of mass lesion, ventriculomegaly, extra-axial collection or acute intracranial hemorrhage. Cervicomedullary junction and pituitary are within normal limits. Mild T2 and FLAIR hyperintensity in the left SCA territory involvement., also at the right occipital pole. But otherwise stable gray and white matter signal throughout the brain since 2021 with minimal for age nonspecific supratentorial white matter T2 and FLAIR hyperintensity. No cortical encephalomalacia or chronic cerebral blood products identified. Deep gray nuclei and brainstem are within normal limits. Vascular: Major intracranial vascular flow voids are stable compared to the 2021 study including preserved dominant distal left vertebral artery. Skull and upper cervical spine: Negative. Visualized bone marrow signal is within normal limits. Sinuses/Orbits: Stable,  negative. Other: Mastoids are clear. Visible internal auditory structures appear normal. IMPRESSION: 1. Patchy acute infarcts in the Left cerebellum SCA territory, with several punctate additional ischemic foci elsewhere in the left cerebellar hemisphere, and also at the right occipital pole. This pattern corroborates the suspected acute left vertebral artery origin occlusion by CTA, with distal thromboembolic phenomena. 2. No associated hemorrhage or mass effect. And elsewhere stable since 2021 and largely normal for age noncontrast MRI appearance of the brain. Electronically Signed   By: Marlise Simpers M.D.   On: 01/05/2024 09:25    CT ANGIO HEAD NECK W WO CM (CODE STROKE) Result Date: 01/04/2024 CLINICAL DATA:  63 year old male code stroke. Status post T NK. Cerebellar symptoms. EXAM: CT ANGIOGRAPHY HEAD AND NECK TECHNIQUE: Multidetector CT imaging of the head and neck was  performed using the standard protocol during bolus administration of intravenous contrast. Multiplanar CT image reconstructions and MIPs were obtained to evaluate the vascular anatomy. Carotid stenosis measurements (when applicable) are obtained utilizing NASCET criteria, using the distal internal carotid diameter as the denominator. RADIATION DOSE REDUCTION: This exam was performed according to the departmental dose-optimization program which includes automated exposure control, adjustment of the mA and/or kV according to patient size and/or use of iterative reconstruction technique. CONTRAST:  75 mL Omnipaque  350 COMPARISON:  Head CT 0905 hours today. Cervical spine MRI 10/19/2020.  Brain MRI 07/29/2020. FINDINGS: CTA NECK Skeleton: Previous cervical ACDF C4 through C7. ACDF hardware C4-C5. Solid appearing arthrodesis elsewhere. No acute osseous abnormality identified. Upper chest: Negative. Other neck: Negative. Aortic arch: -3 vessel arch.  Mild great vessel tortuosity. Right carotid system: Mild brachiocephalic and right CCA origin tortuosity.  Mild soft plaque at the right ICA origin. No significant stenosis to the skull base. Left carotid system: Mild tortuosity. Mild to moderate soft and calcified plaque at the left ICA origin and bulb. No significant stenosis. Vertebral arteries: Proximal right subclavian artery appears normal. Minimal calcified plaque near the right vertebral origin with no convincing origin stenosis. Patent right vertebral artery to the skull base with no other significant plaque or stenosis. Proximal left subclavian, left innominate venous contrast bolus streak artifact. Proximal subclavian artery is patent. Left vertebral artery origin is poorly visible, and there seems to be absent enhancement of the left vertebral V1 segment, seemingly reconstituted in the proximal V2 segment at the C6-C7 level on series 6, image 318. The left vertebral artery than is patent and symmetrically enhancing to the skull base with no additional plaque or stenosis. The left vertebral artery V1 flow void seemed normal on the 2022 MRI. CTA HEAD Posterior circulation: Right vertebral artery V4 segment calcified plaque with stenosis on series 6, image 197 such that the right V4 segment distal to the patent right PICA is diminutive. This is stable compared to brain MRI flow voids in 2021. Distal left vertebral artery is dominant, supplies the basilar without stenosis. Left PICA origin remains patent. Patent basilar artery without stenosis. Patent SCA and PCA origins. Posterior communicating arteries are diminutive or absent. Bilateral PCA branches are within normal limits. Anterior circulation: Both ICA siphons are patent. Minimal left siphon plaque and no stenosis. Mild right siphon supraclinoid calcified plaque without stenosis. Patent carotid termini, MCA and ACA origins. Anterior communicating artery may be mildly fenestrated, normal variant. Bilateral ACA branches are within normal limits. Left MCA M1 segment and bifurcation are patent without stenosis.  Right MCA M1 segment and bifurcation are patent without stenosis. Bilateral MCA branches are within normal limits. Venous sinuses: Patent. Anatomic variants: Functionally dominant left vertebral artery with chronically diminutive right V4 distal to PICA. Review of the MIP images confirms the above findings IMPRESSION: 1. 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 Reconstituted proximal V2 segment, and left vertebral otherwise patent to the vertebrobasilar junction. The left vertebral is dominant and primarily supplies the basilar (#2). 2. No other large vessel occlusion. Minimal carotid atherosclerosis for age. 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 3. Chronic cervical ACDF. Salient findings discussed by telephone with Dr. Kimberley Penman on 01/04/2024 at 09:38 . Electronically Signed   By: Marlise Simpers M.D.   On: 01/04/2024 09:47    CT HEAD CODE  STROKE WO CONTRAST Result Date: 01/04/2024 CLINICAL DATA:  Code stroke. 63 year old male with left side weakness. EXAM: CT HEAD WITHOUT CONTRAST TECHNIQUE: Contiguous axial images were obtained from the base of the skull through the vertex without intravenous contrast. RADIATION DOSE REDUCTION: This exam was performed according to the departmental dose-optimization program which includes automated exposure control, adjustment of the mA and/or kV according to patient size and/or use of iterative reconstruction technique. COMPARISON:  Brain MRI 07/29/2020.  Head CT 11/17/2019. FINDINGS: Brain: Cerebral volume remains normal for age. No midline shift, ventriculomegaly, mass effect, evidence of mass lesion, intracranial hemorrhage or evidence of cortically based acute infarction. Gray-white matter differentiation is within normal limits throughout the brain. Vascular: No suspicious intracranial vascular hyperdensity.  Skull: Stable, intact. Sinuses/Orbits: Visualized paranasal sinuses and mastoids are stable and well aerated. Other: No gaze deviation, acute orbit or scalp soft tissue finding. ASPECTS [facility] Stroke Program Early CT Score) Total score (0-10 with 10 being normal): 10 IMPRESSION: Stable and normal for age noncontrast CT appearance of the brain. ASPECTS 10. These results were communicated to Dr. Lindzen at 9:12 am on 01/04/2024 by text page via the [facility] messaging system. Electronically Signed   By: Marlise Simpers M.D.   On: 01/04/2024 09:12     HISTORY OF PRESENT ILLNESS 63 y.o. patient with history of angina, basal cell carcinoma of the face, CKD stage IIIa, CAD s/p stenting x 5, depression, DM 2, HTN, heart murmur, nephrolithiasis, HLD, migraine headaches, sarcoidosis, OSA on CPAP, prior cervical discectomy s/p fusion was admitted with chest pain, dizziness, and left-sided weakness s/p TNKase .  MRI revealed infarcts in the left cerebellum as well as the right occipital pole.  NIH on admission 6.   HOSPITAL COURSE Stroke:  left cerebellar (PCA and SCA) stroke as well as small infarct in right occipital pole s/p TNK, etiology: embolic pattern, cryptogenic  Code Stroke CT head No acute abnormality. ASPECTS 10.    CTA head & neck streak artifact at left thoracic inlet but strong evidence of left vertebral artery occlusive origin occlusion with reconstitution at proximal V2 segment MRI patchy acute infarcts in the left cerebellum SCA territory with several punctate acute ischemic foci elsewhere in the left cerebellar hemisphere and at the right occipital pole 2D Echo EF 60 to 65%, grade 1 diastolic dysfunction, normal left atrial size, no atrial level shunt LE venous doppler no DVT TEE: Atrial septal aneurysm with small PFO Loop recorder placed on 5/1  LDL 41 HgbA1c 8.4 UDS neg VTE prophylaxis - SCDs aspirin  81 mg daily prior to admission, now on aspirin  81 mg daily and clopidogrel  75 mg daily for 3 weeks  and then Plavix  alone. Enrolled in [place] stroke trial 01/05/24- pharmacy to dispense medication at discharge.  Therapy recommendations:  No follow up needed  Disposition: Discharge to home   PFO with ASA TEE: Atrial septal aneurysm with small PFO Not able to perform TCD bubble study inpatient Recommend outpt TCD bubble study if needed Pt has multiple risk factor for stroke, PFO likely not his cause of stroke ROPE score 4  Pt will follow up with Dr. Berry Bristol tomorrow for further discussion   Hypertension Home meds: Losartan  50 mg daily, carvedilol  3.125 mg daily Stable Long term BP goal normotensive   Hyperlipidemia Home meds: Atorvastatin  40 mg daily, resumed in hospital LDL 41, goal < 70 Continue statin at discharge   Diabetes type II Uncontrolled Home meds: Tresiba  40 units daily, semaglutide  1 mg weekly HgbA1c 8.4, goal <  7.0 CBGs SSI Recommend close follow-up with PCP for better DM control   Other Stroke Risk Factors Coronary artery disease s/p stenting - followed with Dr. Berry Bristol Migraine headaches OSA on CPAP   Other Active Problems CKD 3a Cre 1.45 - 1.32 Sarcoidosis BPH on flomax    Hospital day # 2   DISCHARGE EXAM   PHYSICAL EXAM General:  Alert, well-nourished, well-developed patient in no acute distress Psych:  Mood and affect appropriate for situation. Calm and cooperative with exam CV: Regular rate and rhythm on monitor Respiratory:  Regular, unlabored respirations on room air GI: Abdomen soft and nontender   NEURO:  Mental Status: AA&Ox3, patient is able to give clear and coherent history Speech/Language: speech is without dysarthria or aphasia.     Cranial Nerves:  II: PERRL. Visual fields full.  III, IV, VI: EOMI. Eyelids elevate symmetrically.  V: Sensation is intact to light touch and symmetrical to face.  VII: Face is symmetrical resting and smiling VIII: Hearing intact to voice. IX, X: Phonation is normal.  XI: Shoulder shrug 5/5. XII:  Tongue protrudes midline Motor: 5/5 strength to all muscle groups tested.  Tone: is normal and bulk is normal Sensation: Intact to light touch bilaterally.  Coordination: FTN intact bilaterally, HKS: no ataxia in BLE. No drift.  Gait: Deferred   Most Recent NIH 0   DISCHARGE PLAN Disposition: Home aspirin  81 mg daily and clopidogrel  75 mg daily for secondary stroke prevention for 3 weeks then clopidogrel  75 mg daily alone in addition to [place] trial medication.  Ongoing stroke risk factor control by Primary Care Physician at time of discharge Follow-up PCP Donnie Galea, MD in 2 weeks. Follow-up in [facility] in 4-6 weeks, office to schedule an appointment.  ================================ He was admitted with acute onset left-sided weakness and speech changes.  Inpatient course discussed and he had significant recovery of function in the meantime.  He doesn't have presyncope but can occ feel a little lightheaded.  He has loop recorder.  No new neurologic deficit in the meantime.  Now on aspirin  81 mg daily and clopidogrel  75 mg daily for 3 weeks and then Plavix  alone. Enrolled in [place] stroke trial 01/05/24.  He is going to talk to cardiology about possible bubble study.  Rationale for study discussed with patient.  His L sided weakness and speech is clearly better.   Swallowing well.  No FCNAVD.  No cough.  R handed.    Sugar was 140 this AM.   BP has been 120-140s over 60-80s at home.   Meds, vitals, and allergies reviewed.   ROS: Per HPI unless specifically indicated in ROS section   GEN: nad, alert and oriented HEENT: mucous membranes moist NECK: supple w/o LA CV: rrr. PULM: ctab, no inc wob ABD: soft, +bs EXT: no edema SKIN: no acute rash, loop recorder site appears to be healing normally. CN2-12 wnl with minimal left-sided weakness. Speech normal.

## 2024-01-12 ENCOUNTER — Ambulatory Visit
Admission: RE | Admit: 2024-01-12 | Discharge: 2024-01-12 | Disposition: A | Discharge status: Home / Self Care | Attending: Nurse Practitioner | Admitting: Nurse Practitioner

## 2024-01-12 ENCOUNTER — Other Ambulatory Visit: Payer: Self-pay | Admitting: Nurse Practitioner

## 2024-01-12 ENCOUNTER — Encounter (HOSPITAL_COMMUNITY): Payer: Self-pay

## 2024-01-12 ENCOUNTER — Ambulatory Visit
Admission: RE | Admit: 2024-01-12 | Discharge: 2024-01-12 | Disposition: A | Discharge status: Home / Self Care | Source: Ambulatory Visit | Attending: Nurse Practitioner | Admitting: Nurse Practitioner

## 2024-01-12 DIAGNOSIS — M533 Sacrococcygeal disorders, not elsewhere classified: Secondary | ICD-10-CM

## 2024-01-12 DIAGNOSIS — Z8673 Personal history of transient ischemic attack (TIA), and cerebral infarction without residual deficits: Secondary | ICD-10-CM | POA: Insufficient documentation

## 2024-01-12 DIAGNOSIS — M545 Low back pain, unspecified: Secondary | ICD-10-CM

## 2024-01-12 DIAGNOSIS — M47816 Spondylosis without myelopathy or radiculopathy, lumbar region: Secondary | ICD-10-CM | POA: Diagnosis not present

## 2024-01-12 DIAGNOSIS — M25551 Pain in right hip: Secondary | ICD-10-CM | POA: Insufficient documentation

## 2024-01-12 NOTE — Assessment & Plan Note (Signed)
 Not completely back to baseline but with significant recovery of function.  He is going to take aspirin  and Plavix  together for 3 weeks then Plavix  alone.  He is enrolled in the [place] stroke trial per neurology.  He is going to follow with cardiology for consideration of bubble study.  He has loop recorder implanted.  If he remains lightheaded he can cut amlodipine  in half.

## 2024-01-12 NOTE — Assessment & Plan Note (Signed)
 If he remains lightheaded he can cut amlodipine  in half.  He can update me as needed.

## 2024-01-12 NOTE — Assessment & Plan Note (Signed)
 His sugar has been reasonable in the meantime.  He can update me about his readings next week.  No change in medications in the meantime.  Continue insulin  metformin  and Ozempic  as is. Recheck A1c at a visit in about 3 months.

## 2024-01-13 ENCOUNTER — Encounter: Payer: Self-pay | Admitting: Nurse Practitioner

## 2024-01-13 ENCOUNTER — Ambulatory Visit: Admitting: Nurse Practitioner

## 2024-01-13 VITALS — BP 134/84 | HR 69 | Ht 70.0 in | Wt 204.2 lb

## 2024-01-13 DIAGNOSIS — I639 Cerebral infarction, unspecified: Secondary | ICD-10-CM

## 2024-01-13 DIAGNOSIS — G4730 Sleep apnea, unspecified: Secondary | ICD-10-CM | POA: Diagnosis not present

## 2024-01-13 DIAGNOSIS — Z8673 Personal history of transient ischemic attack (TIA), and cerebral infarction without residual deficits: Secondary | ICD-10-CM

## 2024-01-13 DIAGNOSIS — D869 Sarcoidosis, unspecified: Secondary | ICD-10-CM

## 2024-01-13 NOTE — Assessment & Plan Note (Signed)
 Appears inactive. Prior CXR without any evidence of active disease. Continue to monitor.

## 2024-01-13 NOTE — Assessment & Plan Note (Signed)
 OSA on CPAP. Excellent compliance and control. Receives benefit from use. He is having some moderate leaks. Possibly due to mask fit with weight loss. Will refer him for mask fitting at [facility]. Orders placed for new CPAP. Aware of proper care/use of device. Understands risks of untreated OSA. Will reassess leaks and issues with dry mouth at follow up and adjust pressures as needed. Safe driving practices reviewed. Continued healthy weight loss encouraged.   Patient Instructions  Continue to use CPAP every night, minimum of 4-6 hours a night.  Change equipment as directed. Wash your tubing with warm soap and water daily, hang to dry. Wash humidifier portion weekly. Use bottled, distilled water and change daily Be aware of reduced alertness and do not drive or operate heavy machinery if experiencing this or drowsiness.  Exercise encouraged, as tolerated. Healthy weight management discussed.  Avoid or decrease alcohol consumption and medications that make you more sleepy, if possible. Notify if persistent daytime sleepiness occurs even with consistent use of PAP therapy.  Mask fitting at [facility]   Orders placed for new CPAP machine  Glad breathing is doing well!   Follow up in 10 weeks with Katie Nini Cavan,NP after getting new CPAP. If symptoms do not improve or worsen, please contact office for sooner follow up or seek emergency care.

## 2024-01-13 NOTE — Assessment & Plan Note (Signed)
 Recent hospitalization. No residual deficits. Follow up with cardiology and neurology as scheduled.

## 2024-01-13 NOTE — Patient Instructions (Addendum)
 Continue to use CPAP every night, minimum of 4-6 hours a night.  Change equipment as directed. Wash your tubing with warm soap and water daily, hang to dry. Wash humidifier portion weekly. Use bottled, distilled water and change daily Be aware of reduced alertness and do not drive or operate heavy machinery if experiencing this or drowsiness.  Exercise encouraged, as tolerated. Healthy weight management discussed.  Avoid or decrease alcohol consumption and medications that make you more sleepy, if possible. Notify if persistent daytime sleepiness occurs even with consistent use of PAP therapy.  Mask fitting at [facility]   Orders placed for new CPAP machine  Glad breathing is doing well!   Follow up in 10 weeks with Katie Hero Kulish,NP after getting new CPAP. If symptoms do not improve or worsen, please contact office for sooner follow up or seek emergency care.

## 2024-01-13 NOTE — Progress Notes (Signed)
 @Patient  ID: [name], male    DOB: Oct 26, 1960, 63 y.o.   MRN: [id number]  Chief Complaint  Patient presents with   Follow-up    Referring provider: Donnie Galea, MD  HPI: 62 year old male, never smoker followed for OSA on CPAP. He has a history of sarcoidosis confirmed by lung biopsy and briefly treated with steroids >20 years ago. Last seen in office 03/08/2023 by Sueanne Emerald NP. Past medical history significant for CAD, HTN, DM, chronic back and shoulder pain, hx of CVA.   TEST/EVENTS:  02/16/2022 CXR: stable postsurgical changes right base. Lungs otherwise clear  03/08/2023: OV with Sueanne Emerald NP. Pre op risk assessment. Planning upcoming shoulder arthroscopy for rotator repair. 100% compliant with CPAP. Sleeping well. No issues. No acute respiratory symptoms.   01/13/2024: Today - follow up Discussed the use of AI scribe software for clinical note transcription with the patient, who gave verbal consent to proceed.  History of Present Illness   ADARRYL TROTTIER "[name]" presents for follow up.   He was recently hospitalized due to a stroke. Since then, he feels generally weak and tired but notes that he is otherwise recovering well. He is currently unable to drive and is not allowed to return to work until June 3rd, pending further evaluation.   He uses a CPAP machine every night for sleep apnea. The machine is over 16 years old and has been experiencing issues such as drying out his mouth and cutting off intermittently during the night. He uses a nasal mask with a chin strap, but still experiences dryness, particularly in the early morning. He fills the machine's water chamber every night, and there is usually some water left by morning. Despite these issues, he sleeps better with the CPAP than without it. Feels well rested for the most part with it. Denies any drowsy driving or morning headaches.   He mentions having a hole in his heart and is scheduled to return for further  evaluation on Monday. He is awaiting a saline bubble test and a stress test within the next two weeks to assess his heart condition. He also had  loop recorder implanted.   No current issues with breathing or cough. No lesions, skin changes.   He is on Ozempic  and intentionally lost around 20 lb.     10/15/2023-01/12/2024: CPAP 8 cmH2O 88/90 days; 98% >4 hr; average use 7 hr 33 min Leaks 95th 24.4 AHI 0.6  Allergies  Allergen Reactions   Ambien [Zolpidem] Other (See Comments)    Parasomnias, sleep walking   Lisinopril Cough   Metformin  And Related Other (See Comments)    GI upset with immediate release form   Tramadol  Diarrhea    Immunization History  Administered Date(s) Administered   Fluad Quad(high Dose 65+) 06/12/2022   Influenza Inj Mdck Quad Pf 06/02/2018   Influenza Whole 09/17/2010   Influenza, Seasonal, Injecte, Preservative Fre 07/29/2023   Influenza,inj,Quad PF,6+ Mos 06/08/2013, 05/22/2014, 05/12/2016, 05/08/2019, 05/27/2020   Influenza-Unspecified 06/08/2013, 05/22/2014, 06/08/2015, 05/12/2016, 05/21/2017, 06/02/2018, 05/09/2019, 05/21/2021   PFIZER(Purple Top)SARS-COV-2 Vaccination 11/27/2019, 12/25/2019, 08/07/2020, 08/19/2020, 05/21/2021   Pfizer Covid-19 Vaccine Bivalent Booster 66yrs & up 05/21/2021   Pneumococcal Polysaccharide-23 09/08/2007, 02/16/2013   Td 09/07/2009   Td (Adult), 2 Lf Tetanus Toxid, Preservative Free 09/07/2009   Zoster Recombinant(Shingrix) 05/29/2022, 08/18/2022   Zoster, Live 12/16/2015    Past Medical History:  Diagnosis Date   Anginal pain (HCC)    Basal cell carcinoma of face  Chest pain, unspecified    Chronic kidney disease    kidney stones   Coronary atherosclerosis of native coronary artery    s/p stent x5   Depression    Diabetes mellitus without mention of complication    Diverticulosis    Dyspnea    Dysrhythmia    Erectile dysfunction    Essential hypertension, benign    Family history of adverse reaction to  anesthesia    GERD (gastroesophageal reflux disease)    Heart murmur    History of kidney stones    History of nephrolithiasis    Hyperlipidemia    Migraine    Myocardial infarction (HCC)    2016   Neuromuscular disorder (HCC)    Osteoarthritis    Pneumonia    Postsurgical percutaneous transluminal coronary angioplasty status    s/p stent x4   Sarcoidosis    Sleep apnea    uses CPAP   Unspecified sleep apnea    uses C-pap    Tobacco History: Social History   Tobacco Use  Smoking Status Never  Smokeless Tobacco Former   Types: Chew   Quit date: 02/06/2016   Counseling given: Not Answered   Outpatient Medications Prior to Visit  Medication Sig Dispense Refill   amLODipine  (NORVASC ) 5 MG tablet Take 1 tablet (5 mg total) by mouth daily. 90 tablet 1   aspirin  EC 81 MG tablet Take 1 tablet (81 mg total) by mouth daily. Swallow whole. 21 tablet 0   atorvastatin  (LIPITOR ) 40 MG tablet TAKE 1 TABLET BY MOUTH EVERY MORNING 30 tablet 10   carvedilol  (COREG ) 3.125 MG tablet Take 1 tablet (3.125 mg total) by mouth 2 (two) times daily. 180 tablet 3   clopidogrel  (PLAVIX ) 75 MG tablet Take 1 tablet (75 mg total) by mouth daily. 30 tablet 1   Continuous Glucose Sensor (FREESTYLE LIBRE 3 PLUS SENSOR) MISC USE TO CHECK BLOOD SUGAR CONTINUOUSLY. CHANGE SENSOR EVERY 15 DAYS. 6 each 1   cyclobenzaprine  (FLEXERIL ) 5 MG tablet TAKE 1 TABLET BY MOUTH EVERYDAY AT BEDTIME 30 tablet 1   gabapentin  (NEURONTIN ) 300 MG capsule TAKE 1-2 CAPSULES BY MOUTH IN THE EVENING. MAY CAUSE SEDATION 180 capsule 1   insulin  degludec (TRESIBA  FLEXTOUCH) 100 UNIT/ML FlexTouch Pen INJECT 40 UNITS SUBCUTANEOUSLY DAILY. Day supply 30.     losartan  (COZAAR ) 50 MG tablet Take 1 tablet (50 mg total) by mouth daily. 90 tablet 3   metFORMIN  (GLUCOPHAGE -XR) 500 MG 24 hr tablet TAKE 1 TABLET BY MOUTH 2 TIMES DAILY WITH A MEAL. 90 tablet 1   nitroGLYCERIN  (NITROSTAT ) 0.4 MG SL tablet Place 1 tablet (0.4 mg total) under the  tongue every 5 (five) minutes as needed for chest pain. 25 tablet 3   ondansetron  (ZOFRAN ) 4 MG tablet Take 4 mg by mouth daily as needed for nausea or vomiting.     oxyCODONE -acetaminophen  (PERCOCET/ROXICET) 5-325 MG tablet Take 0.5-1 tablets by mouth every 6 (six) hours as needed for severe pain.     pantoprazole  (PROTONIX ) 40 MG tablet TAKE 1 TABLET BY MOUTH EVERY MORNING 30 tablet 10   Semaglutide , 1 MG/DOSE, (OZEMPIC , 1 MG/DOSE,) 4 MG/3ML SOPN INJECT 1 MG AS DIRECTED ONCE A WEEK. 3 mL 1   Study - LIBREXIA-STROKE - milvexian 25 mg or placebo tablet (PI-Sethi) Take 1 tablet by mouth 2 (two) times daily. Contact [facility] for any questions or concerns regarding this medication. 70 tablet 0   tamsulosin  (FLOMAX ) 0.4 MG CAPS capsule Take 2 capsules (  0.8 mg total) by mouth daily. 180 capsule 3   No facility-administered medications prior to visit.     Review of Systems:   Constitutional: No night sweats, fevers, chills +improving fatigue, lassitude; intentional weight loss  HEENT: No headaches, difficulty swallowing, tooth/dental problems, or sore throat. No sneezing, itching, ear ache, nasal congestion, or post nasal drip +AM dry mouth  CV:  No chest pain, orthopnea, PND, swelling in lower extremities, anasarca, dizziness, palpitations, syncope Resp: No shortness of breath with exertion or at rest. No excess mucus or change in color of mucus. No productive or non-productive. No hemoptysis. No wheezing.  No chest wall deformity GI:  No heartburn, indigestion GU: No nocturia  Skin: No rash, lesions, ulcerations MSK:  No joint pain or swelling.   Neuro: No dizziness or lightheadedness.  Psych: No depression or anxiety. Mood stable.     Physical Exam:  BP 134/84 (BP Location: Left Arm, Patient Position: Sitting, Cuff Size: Normal)   Pulse 69   Ht 5\' 10"  (1.778 m)   Wt 204 lb 3.2 oz (92.6 kg)   SpO2 98%   BMI 29.30 kg/m   GEN: Pleasant, interactive,  well-appearing; in no acute distress HEENT:  Normocephalic and atraumatic. PERRLA. Sclera white. Nasal turbinates pink, moist and patent bilaterally. No rhinorrhea present. Oropharynx pink and moist, without exudate or edema. No lesions, ulcerations, or postnasal drip.  NECK:  Supple w/ fair ROM. No JVD present. Normal carotid impulses w/o bruits. Thyroid  symmetrical with no goiter or nodules palpated. No lymphadenopathy.   CV: RRR, no m/r/g, no peripheral edema. Pulses intact, +2 bilaterally. No cyanosis, pallor or clubbing. PULMONARY:  Unlabored, regular breathing. Clear bilaterally A&P w/o wheezes/rales/rhonchi. No accessory muscle use.  GI: BS present and normoactive. Soft, non-tender to palpation. No organomegaly or masses detected. MSK: No erythema, warmth or tenderness. Cap refil <2 sec all extrem. No deformities or joint swelling noted.  Neuro: A/Ox3. No focal deficits noted.   Skin: Warm, no lesions or rashe Psych: Normal affect and behavior. Judgement and thought content appropriate.     Lab Results:  CBC    Component Value Date/Time   WBC 8.8 01/06/2024 0626   RBC 4.82 01/06/2024 0626   HGB 14.7 01/06/2024 0626   HCT 42.5 01/06/2024 0626   PLT 188 01/06/2024 0626   MCV 88.2 01/06/2024 0626   MCH 30.5 01/06/2024 0626   MCHC 34.6 01/06/2024 0626   RDW 13.0 01/06/2024 0626   LYMPHSABS 1.8 01/04/2024 0857   MONOABS 0.8 01/04/2024 0857   EOSABS 0.1 01/04/2024 0857   BASOSABS 0.1 01/04/2024 0857    BMET    Component Value Date/Time   NA 140 01/06/2024 0626   NA 138 09/21/2017 1156   K 3.6 01/06/2024 0626   CL 107 01/06/2024 0626   CO2 23 01/06/2024 0626   GLUCOSE 167 (H) 01/06/2024 0626   BUN 23 01/06/2024 0626   BUN 17 09/21/2017 1156   CREATININE 1.32 (H) 01/06/2024 0626   CREATININE 1.64 (H) 11/12/2023 1540   CALCIUM  9.0 01/06/2024 0626   GFRNONAA >60 01/06/2024 0626   GFRAA 42 (L) 05/05/2019 0522    BNP No results found for: "BNP"   Imaging:  DG  Lumbar Spine Complete W/Bend Result Date: 01/12/2024 CLINICAL DATA:  Low back pain for 4 months. EXAM: LUMBAR SPINE - COMPLETE WITH BENDING VIEWS COMPARISON:  June 19, 2022 FINDINGS: There is no evidence of lumbar spine fracture. Alignment is normal. Mild anterior spurring noted at L1, L2  and L3. Intervertebral disc spaces are maintained. Single minimal dilated air-filled bowel loops in the right abdomen. IMPRESSION: 1. Mild degenerative joint changes of lumbar spine. 2. Single minimal dilated air-filled bowel loops in the right abdomen, nonspecific. Electronically Signed   By: Anna Barnes M.D.   On: 01/12/2024 12:26   EP PPM/ICD IMPLANT Result Date: 01/06/2024 CONCLUSIONS:  1. Successful implantation of a implantable loop recorder for a history of cryptogenic stroke  2. No early apparent complications. Tylene Galla, PA-C Cardiac Electrophysiology     ECHO TEE Result Date: 01/06/2024    TRANSESOPHOGEAL ECHO REPORT   Patient Name:   ANDROS LENKER Date of Exam: 01/06/2024 Medical Rec #:  [id number]           Height:       70.0 in Accession #:    [id number]          Weight:       203.0 lb Date of Birth:  1961/08/17           BSA:          2.101 m Patient Age:    62 years            BP:           155/66 mmHg Patient Gender: M                   HR:           71 bpm. Exam Location:  Inpatient Procedure: Transesophageal Echo, 3D Echo, Color Doppler and Cardiac Doppler            (Both Spectral and Color Flow Doppler were utilized during            procedure). Indications:     Stroke i63.9  History:         Patient has prior history of Echocardiogram examinations, most                  recent 01/05/2024. CAD; Risk Factors:Hypertension, Diabetes,                  Dyslipidemia and Sleep Apnea.  Sonographer:     Sherline Distel Senior RDCS Referring Phys:  Marcie Sever, NICOLE Diagnosing Phys: Gloriann Larger MD PROCEDURE: After discussion of the risks and benefits of a TEE, an informed consent was obtained  from the patient. The transesophogeal probe was passed without difficulty through the esophogus of the patient. Sedation performed by different physician. The patient was monitored while under deep sedation. Anesthestetic sedation was provided intravenously by Anesthesiology: 140mg  of Propofol . The patient developed no complications during the procedure.  IMPRESSIONS  1. Left ventricular ejection fraction, by estimation, is 60 to 65%. The left ventricle has normal function. There is mild concentric left ventricular hypertrophy.  2. Right ventricular systolic function is normal. The right ventricular size is normal.  3. No left atrial/left atrial appendage thrombus was detected. The LAA emptying velocity was 51 cm/s.  4. The mitral valve is normal in structure. No evidence of mitral valve regurgitation. No evidence of mitral stenosis.  5. The aortic valve is tricuspid. Aortic valve regurgitation is not visualized. No aortic stenosis is present.  6. Mildly dilated pulmonary artery.  7. The inferior vena cava is normal in size with greater than 50% respiratory variability, suggesting right atrial pressure of 3 mmHg.  8. Evidence of atrial level shunting detected by color flow Doppler. Agitated saline contrast bubble study was positive with  shunting observed within 3-6 cardiac cycles suggestive of interatrial shunt.  9. 3D performed for the EF and demonstrates Normal LVEF, 63%. FINDINGS  Left Ventricle: Left ventricular ejection fraction, by estimation, is 60 to 65%. The left ventricle has normal function. The left ventricular internal cavity size was normal in size. There is mild concentric left ventricular hypertrophy. Right Ventricle: The right ventricular size is normal. No increase in right ventricular wall thickness. Right ventricular systolic function is normal. Left Atrium: Left atrial size was normal in size. No left atrial/left atrial appendage thrombus was detected. The LAA emptying velocity was 51 cm/s.  Right Atrium: Right atrial size was normal in size. Prominent Eustachian valve. Pericardium: There is no evidence of pericardial effusion. Mitral Valve: The mitral valve is normal in structure. No evidence of mitral valve regurgitation. No evidence of mitral valve stenosis. Tricuspid Valve: The tricuspid valve is normal in structure. Tricuspid valve regurgitation is not demonstrated. No evidence of tricuspid stenosis. Aortic Valve: The aortic valve is tricuspid. Aortic valve regurgitation is not visualized. No aortic stenosis is present. Pulmonic Valve: The pulmonic valve was normal in structure. Pulmonic valve regurgitation is not visualized. No evidence of pulmonic stenosis. Aorta: The aortic root, ascending aorta, aortic arch and descending aorta are all structurally normal, with no evidence of dilitation or obstruction. Pulmonary Artery: The pulmonary artery is mildly dilated. Venous: The right upper pulmonary vein, right lower pulmonary vein, left lower pulmonary vein and left upper pulmonary vein are normal. The inferior vena cava is normal in size with greater than 50% respiratory variability, suggesting right atrial pressure of 3 mmHg. IAS/Shunts: The interatrial septum is aneurysmal. Evidence of atrial level shunting detected by color flow Doppler. Agitated saline contrast was given intravenously to evaluate for intracardiac shunting. Agitated saline contrast bubble study was positive  with shunting observed within 3-6 cardiac cycles suggestive of interatrial shunt. Additional Comments: 3D was performed not requiring image post processing on an independent workstation and was normal. Spectral Doppler performed.  3D Volume EF LV 3D EDV:   36.24 ml LV 3D ESV:   13.41 ml  3D Volume EF: 3D EF:        63 % Gloriann Larger MD Electronically signed by Gloriann Larger MD Signature Date/Time: 01/06/2024/9:28:42 AM    Final    EP STUDY Result Date: 01/06/2024 See surgical note for result.  VAS US  LOWER  EXTREMITY VENOUS (DVT) Result Date: 01/05/2024  Lower Venous DVT Study Patient Name:  BANKS QU  Date of Exam:   01/05/2024 Medical Rec #: [id number]            Accession #:    [id number] Date of Birth: 05-07-1961            Patient Gender: M Patient Age:   76 years [facility]am [facility] Procedure:      VAS US  LOWER EXTREMITY VENOUS (DVT) Referring Phys: Margart Shears DE LA TORRE --------------------------------------------------------------------------------  Indications: Stroke.  Risk Factors: Cancer Carcinoma. Comparison Study: None. Performing Technologist: Estanislao Heimlich  Examination Guidelines: A complete evaluation includes B-mode imaging, spectral Doppler, color Doppler, and power Doppler as needed of all accessible portions of each vessel. Bilateral testing is considered an integral part of a complete examination. Limited examinations for reoccurring indications may be performed as noted. The reflux portion of the exam is performed with the patient in reverse Trendelenburg.  +---------+---------------+---------+-----------+----------+--------------+ RIGHT    CompressibilityPhasicitySpontaneityPropertiesThrombus Aging +---------+---------------+---------+-----------+----------+--------------+ CFV      Full  Yes      Yes                                 +---------+---------------+---------+-----------+----------+--------------+ SFJ      Full                                                        +---------+---------------+---------+-----------+----------+--------------+ FV Prox  Full                                                        +---------+---------------+---------+-----------+----------+--------------+ FV Mid   Full                                                        +---------+---------------+---------+-----------+----------+--------------+ FV DistalFull                                                         +---------+---------------+---------+-----------+----------+--------------+ PFV      Full                                                        +---------+---------------+---------+-----------+----------+--------------+ POP      Full           Yes      Yes                                 +---------+---------------+---------+-----------+----------+--------------+ PTV      Full                    Yes                                 +---------+---------------+---------+-----------+----------+--------------+ PERO     Full                    Yes                                 +---------+---------------+---------+-----------+----------+--------------+   +---------+---------------+---------+-----------+----------+--------------+ LEFT     CompressibilityPhasicitySpontaneityPropertiesThrombus Aging +---------+---------------+---------+-----------+----------+--------------+ CFV      Full           Yes      Yes                                 +---------+---------------+---------+-----------+----------+--------------+ SFJ      Full                                                        +---------+---------------+---------+-----------+----------+--------------+  FV Prox  Full                                                        +---------+---------------+---------+-----------+----------+--------------+ FV Mid   Full                                                        +---------+---------------+---------+-----------+----------+--------------+ FV DistalFull                                                        +---------+---------------+---------+-----------+----------+--------------+ PFV      Full                                                        +---------+---------------+---------+-----------+----------+--------------+ POP      Full           Yes      Yes                                  +---------+---------------+---------+-----------+----------+--------------+ PTV      Full                                                        +---------+---------------+---------+-----------+----------+--------------+ PERO     Full                                                        +---------+---------------+---------+-----------+----------+--------------+     Summary: BILATERAL: - No evidence of deep vein thrombosis seen in the lower extremities, bilaterally. -No evidence of popliteal cyst, bilaterally.   *See table(s) above for measurements and observations. Electronically signed by Genny Kid MD on 01/05/2024 at 8:31:31 PM.    Final    ECHOCARDIOGRAM COMPLETE Result Date: 01/05/2024    ECHOCARDIOGRAM REPORT   Patient Name:   LEONDRO MAGNOTTA Date of Exam: 01/05/2024 Medical Rec #:  [id number]           Height:       70.0 in Accession #:    [id number]          Weight:       203.0 lb Date of Birth:  1961-03-16           BSA:          2.101 m Patient Age:    62 years            BP:  141/82 mmHg Patient Gender: M                   HR:           67 bpm. Exam Location:  Inpatient Procedure: 2D Echo, Cardiac Doppler and Color Doppler (Both Spectral and Color            Flow Doppler were utilized during procedure). Indications:    Stroke  History:        Patient has no prior history of Echocardiogram examinations.                 Previous Myocardial Infarction and CAD, Signs/Symptoms:Chest                 Pain; Risk Factors:Hypertension, Diabetes and Sleep Apnea.  Sonographer:    Willey Harrier Referring Phys: Tyra Galley A WOLFE IMPRESSIONS  1. Left ventricular ejection fraction, by estimation, is 60 to 65%. The left ventricle has normal function. The left ventricle has no regional wall motion abnormalities. There is mild left ventricular hypertrophy of the septal segment. Left ventricular diastolic parameters are consistent with Grade I diastolic dysfunction (impaired relaxation).  2.  Right ventricular systolic function is normal. The right ventricular size is normal.  3. The mitral valve is normal in structure. No evidence of mitral valve regurgitation. No evidence of mitral stenosis.  4. The aortic valve is tricuspid. Aortic valve regurgitation is not visualized. No aortic stenosis is present.  5. The inferior vena cava is normal in size with greater than 50% respiratory variability, suggesting right atrial pressure of 3 mmHg. FINDINGS  Left Ventricle: Left ventricular ejection fraction, by estimation, is 60 to 65%. The left ventricle has normal function. The left ventricle has no regional wall motion abnormalities. The left ventricular internal cavity size was normal in size. There is  mild left ventricular hypertrophy of the septal segment. Left ventricular diastolic parameters are consistent with Grade I diastolic dysfunction (impaired relaxation). Right Ventricle: The right ventricular size is normal. No increase in right ventricular wall thickness. Right ventricular systolic function is normal. Left Atrium: Left atrial size was normal in size. Right Atrium: Right atrial size was normal in size. Pericardium: There is no evidence of pericardial effusion. Mitral Valve: The mitral valve is normal in structure. No evidence of mitral valve regurgitation. No evidence of mitral valve stenosis. MV peak gradient, 4.7 mmHg. The mean mitral valve gradient is 2.0 mmHg. Tricuspid Valve: The tricuspid valve is normal in structure. Tricuspid valve regurgitation is not demonstrated. No evidence of tricuspid stenosis. Aortic Valve: The aortic valve is tricuspid. Aortic valve regurgitation is not visualized. No aortic stenosis is present. Aortic valve peak gradient measures 12.8 mmHg. Pulmonic Valve: The pulmonic valve was normal in structure. Pulmonic valve regurgitation is mild. No evidence of pulmonic stenosis. Aorta: The aortic root is normal in size and structure. Venous: The inferior vena cava is  normal in size with greater than 50% respiratory variability, suggesting right atrial pressure of 3 mmHg. IAS/Shunts: No atrial level shunt detected by color flow Doppler.  LEFT VENTRICLE PLAX 2D LVIDd:         4.50 cm   Diastology LVIDs:         2.90 cm   LV e' medial:    9.90 cm/s LV PW:         0.90 cm   LV E/e' medial:  9.5 LV IVS:        1.10 cm   LV e'  lateral:   12.60 cm/s LVOT diam:     2.00 cm   LV E/e' lateral: 7.5 LV SV:         84 LV SV Index:   40 LVOT Area:     3.14 cm  RIGHT VENTRICLE             IVC RV Basal diam:  4.30 cm     IVC diam: 1.40 cm RV S prime:     15.20 cm/s TAPSE (M-mode): 2.4 cm LEFT ATRIUM             Index        RIGHT ATRIUM           Index LA Vol (A2C):   40.2 ml 19.14 ml/m  RA Area:     14.10 cm LA Vol (A4C):   29.5 ml 14.04 ml/m  RA Volume:   35.50 ml  16.90 ml/m LA Biplane Vol: 37.5 ml 17.85 ml/m  AORTIC VALVE AV Area (Vmax): 2.19 cm AV Vmax:        179.00 cm/s AV Peak Grad:   12.8 mmHg LVOT Vmax:      125.00 cm/s LVOT Vmean:     88.100 cm/s LVOT VTI:       0.266 m  AORTA Ao Root diam: 3.10 cm Ao Asc diam:  3.10 cm MITRAL VALVE MV Area (PHT): 3.05 cm     SHUNTS MV Area VTI:   2.11 cm     Systemic VTI:  0.27 m MV Peak grad:  4.7 mmHg     Systemic Diam: 2.00 cm MV Mean grad:  2.0 mmHg MV Vmax:       1.08 m/s MV Vmean:      67.0 cm/s MV Decel Time: 249 msec MV E velocity: 94.30 cm/s MV A velocity: 101.00 cm/s MV E/A ratio:  0.93 Maudine Sos MD Electronically signed by Maudine Sos MD Signature Date/Time: 01/05/2024/12:13:53 PM    Final    MR BRAIN WO CONTRAST Result Date: 01/05/2024 CLINICAL DATA:  63 year old male code stroke presentation yesterday, status post T NK, proximal left vertebral artery occlusion but reconstitution by CTA. EXAM: MRI HEAD WITHOUT CONTRAST TECHNIQUE: Multiplanar, multiecho pulse sequences of the brain and surrounding structures were obtained without intravenous contrast. COMPARISON:  CT head, CTA head and neck yesterday. Brain MRI  07/29/2020. FINDINGS: Brain: Patchy restricted diffusion in the left cerebellum is primarily in the SCA territory on series 2, image 17. But there are additional punctate foci of abnormal diffusion in the posterior and lateral left cerebellar hemisphere (series 2, image 15) and also in the contralateral right occipital pole (series 2, image 21). The right cerebellar hemispheres spared. No convincing brainstem involvement. No anterior circulation restricted diffusion. No midline shift, mass effect, evidence of mass lesion, ventriculomegaly, extra-axial collection or acute intracranial hemorrhage. Cervicomedullary junction and pituitary are within normal limits. Mild T2 and FLAIR hyperintensity in the left SCA territory involvement., also at the right occipital pole. But otherwise stable gray and white matter signal throughout the brain since 2021 with minimal for age nonspecific supratentorial white matter T2 and FLAIR hyperintensity. No cortical encephalomalacia or chronic cerebral blood products identified. Deep gray nuclei and brainstem are within normal limits. Vascular: Major intracranial vascular flow voids are stable compared to the 2021 study including preserved dominant distal left vertebral artery. Skull and upper cervical spine: Negative. Visualized bone marrow signal is within normal limits. Sinuses/Orbits: Stable, negative. Other: Mastoids are clear. Visible internal auditory structures appear normal.  IMPRESSION: 1. Patchy acute infarcts in the Left cerebellum SCA territory, with several punctate additional ischemic foci elsewhere in the left cerebellar hemisphere, and also at the right occipital pole. This pattern corroborates the suspected acute left vertebral artery origin occlusion by CTA, with distal thromboembolic phenomena. 2. No associated hemorrhage or mass effect. And elsewhere stable since 2021 and largely normal for age noncontrast MRI appearance of the brain. Electronically Signed   By: Marlise Simpers M.D.   On: 01/05/2024 09:25   CT ANGIO HEAD NECK W WO CM (CODE STROKE) Result Date: 01/04/2024 CLINICAL DATA:  63 year old male code stroke. Status post T NK. Cerebellar symptoms. EXAM: CT ANGIOGRAPHY HEAD AND NECK TECHNIQUE: Multidetector CT imaging of the head and neck was performed using the standard protocol during bolus administration of intravenous contrast. Multiplanar CT image reconstructions and MIPs were obtained to evaluate the vascular anatomy. Carotid stenosis measurements (when applicable) are obtained utilizing NASCET criteria, using the distal internal carotid diameter as the denominator. RADIATION DOSE REDUCTION: This exam was performed according to the departmental dose-optimization program which includes automated exposure control, adjustment of the mA and/or kV according to patient size and/or use of iterative reconstruction technique. CONTRAST:  75 mL Omnipaque  350 COMPARISON:  Head CT 0905 hours today. Cervical spine MRI 10/19/2020.  Brain MRI 07/29/2020. FINDINGS: CTA NECK Skeleton: Previous cervical ACDF C4 through C7. ACDF hardware C4-C5. Solid appearing arthrodesis elsewhere. No acute osseous abnormality identified. Upper chest: Negative. Other neck: Negative. Aortic arch: -3 vessel arch.  Mild great vessel tortuosity. Right carotid system: Mild brachiocephalic and right CCA origin tortuosity. Mild soft plaque at the right ICA origin. No significant stenosis to the skull base. Left carotid system: Mild tortuosity. Mild to moderate soft and calcified plaque at the left ICA origin and bulb. No significant stenosis. Vertebral arteries: Proximal right subclavian artery appears normal. Minimal calcified plaque near the right vertebral origin with no convincing origin stenosis. Patent right vertebral artery to the skull base with no other significant plaque or stenosis. Proximal left subclavian, left innominate venous contrast bolus streak artifact. Proximal subclavian artery is patent.  Left vertebral artery origin is poorly visible, and there seems to be absent enhancement of the left vertebral V1 segment, seemingly reconstituted in the proximal V2 segment at the C6-C7 level on series 6, image 318. The left vertebral artery than is patent and symmetrically enhancing to the skull base with no additional plaque or stenosis. The left vertebral artery V1 flow void seemed normal on the 2022 MRI. CTA HEAD Posterior circulation: Right vertebral artery V4 segment calcified plaque with stenosis on series 6, image 197 such that the right V4 segment distal to the patent right PICA is diminutive. This is stable compared to brain MRI flow voids in 2021. Distal left vertebral artery is dominant, supplies the basilar without stenosis. Left PICA origin remains patent. Patent basilar artery without stenosis. Patent SCA and PCA origins. Posterior communicating arteries are diminutive or absent. Bilateral PCA branches are within normal limits. Anterior circulation: Both ICA siphons are patent. Minimal left siphon plaque and no stenosis. Mild right siphon supraclinoid calcified plaque without stenosis. Patent carotid termini, MCA and ACA origins. Anterior communicating artery may be mildly fenestrated, normal variant. Bilateral ACA branches are within normal limits. Left MCA M1 segment and bifurcation are patent without stenosis. Right MCA M1 segment and bifurcation are patent without stenosis. Bilateral MCA branches are within normal limits. Venous sinuses: Patent. Anatomic variants: Functionally dominant left vertebral artery with chronically diminutive  right V4 distal to PICA. Review of the MIP images confirms the above findings IMPRESSION: 1. 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 Reconstituted proximal V2 segment, and left vertebral otherwise patent to the vertebrobasilar junction. The left vertebral is dominant and primarily supplies the basilar  (#2). 2. No other large vessel occlusion. Minimal carotid atherosclerosis for age. 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 3. Chronic cervical ACDF. Salient findings discussed by telephone with Dr. Kimberley Penman on 01/04/2024 at 09:38 . Electronically Signed   By: Marlise Simpers M.D.   On: 01/04/2024 09:47   CT HEAD CODE STROKE WO CONTRAST Result Date: 01/04/2024 CLINICAL DATA:  Code stroke. 63 year old male with left side weakness. EXAM: CT HEAD WITHOUT CONTRAST TECHNIQUE: Contiguous axial images were obtained from the base of the skull through the vertex without intravenous contrast. RADIATION DOSE REDUCTION: This exam was performed according to the departmental dose-optimization program which includes automated exposure control, adjustment of the mA and/or kV according to patient size and/or use of iterative reconstruction technique. COMPARISON:  Brain MRI 07/29/2020.  Head CT 11/17/2019. FINDINGS: Brain: Cerebral volume remains normal for age. No midline shift, ventriculomegaly, mass effect, evidence of mass lesion, intracranial hemorrhage or evidence of cortically based acute infarction. Gray-white matter differentiation is within normal limits throughout the brain. Vascular: No suspicious intracranial vascular hyperdensity. Skull: Stable, intact. Sinuses/Orbits: Visualized paranasal sinuses and mastoids are stable and well aerated. Other: No gaze deviation, acute orbit or scalp soft tissue finding. ASPECTS [facility] Stroke Program Early CT Score) Total score (0-10 with 10 being normal): 10 IMPRESSION: Stable and normal for age noncontrast CT appearance of the brain. ASPECTS 10. These results were communicated to Dr. Lindzen at 9:12 am on 01/04/2024 by text page via the [facility] messaging system. Electronically Signed   By: Marlise Simpers M.D.   On: 01/04/2024 09:12    Administration History     None           No data  to display          No results found for: "NITRICOXIDE"      Assessment & Plan:   Sleep apnea OSA on CPAP. Excellent compliance and control. Receives benefit from use. He is having some moderate leaks. Possibly due to mask fit with weight loss. Will refer him for mask fitting at [facility]. Orders placed for new CPAP. Aware of proper care/use of device. Understands risks of untreated OSA. Will reassess leaks and issues with dry mouth at follow up and adjust pressures as needed. Safe driving practices reviewed. Continued healthy weight loss encouraged.   Patient Instructions  Continue to use CPAP every night, minimum of 4-6 hours a night.  Change equipment as directed. Wash your tubing with warm soap and water daily, hang to dry. Wash humidifier portion weekly. Use bottled, distilled water and change daily Be aware of reduced alertness and do not drive or operate heavy machinery if experiencing this or drowsiness.  Exercise encouraged, as tolerated. Healthy weight management discussed.  Avoid or decrease alcohol consumption and medications that make you more sleepy, if possible. Notify if persistent daytime sleepiness occurs even with consistent use of PAP therapy.  Mask fitting at [facility]   Orders placed for new CPAP machine  Glad breathing is doing well!   Follow up in 10 weeks with Katie Harrel Ferrone,NP after getting new CPAP.  If symptoms do not improve or worsen, please contact office for sooner follow up or seek emergency care.    Acute ischemic stroke [facility]) Recent hospitalization. No residual deficits. Follow up with cardiology and neurology as scheduled.   Sarcoidosis Appears inactive. Prior CXR without any evidence of active disease. Continue to monitor.   Advised if symptoms do not improve or worsen, to please contact office for sooner follow up or seek emergency care.   I spent 35 minutes of dedicated to the care of this patient on the date of this encounter to include  pre-visit review of records, face-to-face time with the patient discussing conditions above, post visit ordering of testing, clinical documentation with the electronic health record, making appropriate referrals as documented, and communicating necessary findings to members of the patients care team.  Roetta Clarke, NP 01/13/2024  Pt aware and understands NP's role.

## 2024-01-17 ENCOUNTER — Encounter: Payer: Self-pay | Admitting: Family Medicine

## 2024-01-17 ENCOUNTER — Encounter: Payer: Self-pay | Admitting: Cardiology

## 2024-01-17 ENCOUNTER — Ambulatory Visit: Attending: Cardiology | Admitting: Cardiology

## 2024-01-17 VITALS — BP 129/73 | HR 72 | Resp 16 | Ht 70.0 in | Wt 205.2 lb

## 2024-01-17 DIAGNOSIS — Q2112 Patent foramen ovale: Secondary | ICD-10-CM

## 2024-01-17 DIAGNOSIS — I63442 Cerebral infarction due to embolism of left cerebellar artery: Secondary | ICD-10-CM | POA: Diagnosis not present

## 2024-01-17 DIAGNOSIS — E782 Mixed hyperlipidemia: Secondary | ICD-10-CM | POA: Diagnosis not present

## 2024-01-17 DIAGNOSIS — I25118 Atherosclerotic heart disease of native coronary artery with other forms of angina pectoris: Secondary | ICD-10-CM | POA: Diagnosis not present

## 2024-01-17 NOTE — Progress Notes (Signed)
 Cardiology Office Note:  .   Date:  01/17/2024  ID:  Tony Manning, DOB [birth year], MRN [id number] PCP: Donnie Galea, MD  [facility] Providers Cardiologist:  Knox Perl, MD   History of Present Illness: .   Tony Manning is a 63 y.o. 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   Patient presented to the emergency room on 01/04/2024 with left-sided weakness, MRI showing patchy acute infarcts in the left cerebellum SP TNK infusion.  TEE revealed PFO with septal aneurysm, loop recorder was also loop recorder implanted and discharged home and was seen on 01/06/2024 in our office by Charles Connor, NP he now presents to follow-up with me.  Neurology evaluation by Dr. Consuelo Denmark felt that patient had cryptogenic stroke and PFO was probably not the cause of his stroke.  He is accompanied by his wife.  States that most of the neurologic deficits has resolved.  Discussed the use of AI scribe software for clinical note transcription with the patient, who gave verbal consent to proceed.  History of Present Illness Tony Manning "[name]" is a 63 year old male with a history of multiple strokes who presents for cardiovascular follow-up to discuss PFO and paradoxical stroke.  Fortunately stroke symptoms have resolved.  He presented immediately upon the onset of symptoms and received IV tPA.  He experienced left-sided weakness on April 29, leading to hospitalization where multiple strokes in the cerebellum were diagnosed. TNK was administered, resolving his symptoms by the afternoon. A loop recorder was placed to monitor for atrial fibrillation.  He is on atorvastatin  40 mg daily, with an LDL level of 41, and takes aspirin  and Plavix  following his stroke. He experienced chest tightness prior to his  stroke and has a stress test scheduled for June 12.  He has diabetes, which was exacerbated by prednisone , with blood sugar levels reaching the 300s but now averaging 171. He uses a CPAP machine for sleep apnea and is awaiting a new device. He is currently on leave from work until June 3, awaiting clearance after upcoming tests.  Labs   Lab Results  Component Value Date   CHOL 109 01/05/2024   HDL 42 01/05/2024   LDLCALC 41 01/05/2024   TRIG 129 01/05/2024   CHOLHDL 2.6 01/05/2024   Lab Results  Component Value Date   NA 140 01/06/2024   K 3.6 01/06/2024   CO2 23 01/06/2024   GLUCOSE 167 (H) 01/06/2024   BUN 23 01/06/2024   CREATININE 1.32 (H) 01/06/2024   CALCIUM  9.0 01/06/2024   GFR 59.11 (L) 06/11/2023   GFRNONAA >60 01/06/2024      Latest Ref Rng & Units 01/06/2024    6:26 AM 01/05/2024    9:35 AM 01/04/2024    9:02 AM  BMP  Glucose 70 - 99 mg/dL 409  811  914   BUN 8 - 23 mg/dL 23  25  31    Creatinine 0.61 - 1.24 mg/dL 7.82  9.56  2.13   Sodium 135 - 145 mmol/L 140  137  139   Potassium 3.5 - 5.1 mmol/L 3.6  3.4  3.5   Chloride 98 - 111 mmol/L 107  104  105   CO2 22 - 32 mmol/L 23  21    Calcium  8.9 - 10.3 mg/dL 9.0  8.9  Latest Ref Rng & Units 01/06/2024    6:26 AM 01/05/2024    9:35 AM 01/04/2024    9:02 AM  CBC  WBC 4.0 - 10.5 K/uL 8.8  11.8    Hemoglobin 13.0 - 17.0 g/dL 47.8  29.5  62.1   Hematocrit 39.0 - 52.0 % 42.5  44.0  42.0   Platelets 150 - 400 K/uL 188  205     Lab Results  Component Value Date   HGBA1C 8.4 (H) 11/12/2023    Lab Results  Component Value Date   TSH 1.34 01/27/2021    ROS  Review of Systems  Cardiovascular:  Negative for chest pain, dyspnea on exertion and leg swelling.   Physical Exam:   VS:  BP 129/73 (BP Location: Left Arm, Patient Position: Sitting, Cuff Size: Large)   Pulse 72   Resp 16   Ht 5\' 10"  (1.778 m)   Wt 205 lb 3.2 oz (93.1 kg)   SpO2 98%   BMI 29.44 kg/m    Wt Readings from Last 3 Encounters:   01/17/24 205 lb 3.2 oz (93.1 kg)  01/13/24 204 lb 3.2 oz (92.6 kg)  01/10/24 201 lb 3.2 oz (91.3 kg)    Physical Exam Neck:     Vascular: No carotid bruit or JVD.  Cardiovascular:     Rate and Rhythm: Normal rate and regular rhythm.     Pulses: Intact distal pulses.     Heart sounds: Normal heart sounds. No murmur heard.    No gallop.  Pulmonary:     Effort: Pulmonary effort is normal.     Breath sounds: Normal breath sounds.  Abdominal:     General: Bowel sounds are normal.     Palpations: Abdomen is soft.  Musculoskeletal:     Right lower leg: No edema.     Left lower leg: No edema.    Studies Reviewed: Aaron Aas    Coronary angiogram 10/16/2016: 3.0 x 38 mm onyx DES Prox RCA. 06/14/2014: Thrombectomy of the mid RCA & Stenting with 3.0 x 18 mm Xience Alpine DES. Stenting mid LAD 3.0 x 18 mm Xience Alpine DES. Patent Mid LAD 2.75x12 mm Taxus, 2.5x12 Distal RCA stent x 2 from 2004 and 2007. Normal LVEF. Diffuse CAD.     ECHO TEE 01/06/2024  . Left ventricular ejection fraction, by estimation, is 60 to 65%. The left ventricle has normal function. There is mild concentric left ventricular hypertrophy. 2. Right ventricular systolic function is normal. The right ventricular size is normal. 3. No left atrial/left atrial appendage thrombus was detected. The LAA emptying velocity was 51 cm/s. 4. The mitral valve is normal in structure. No evidence of mitral valve regurgitation. No evidence of mitral stenosis. 5. The aortic valve is tricuspid. Aortic valve regurgitation is not visualized. No aortic stenosis is present. 6. Mildly dilated pulmonary artery. 7. The inferior vena cava is normal in size with greater than 50% respiratory variability, suggesting right atrial pressure of 3 mmHg. 8. Evidence of atrial level shunting detected by color flow Doppler. Agitated saline contrast bubble study was positive with shunting observed within 3-6 cardiac cycles suggestive of interatrial shunt.  EKG:         Medications and allergies    Allergies  Allergen Reactions   Ambien [Zolpidem] Other (See Comments)    Parasomnias, sleep walking   Lisinopril Cough   Metformin  And Related Other (See Comments)    GI upset with immediate release form   Tramadol  Diarrhea  Current Outpatient Medications:    amLODipine  (NORVASC ) 5 MG tablet, Take 1 tablet (5 mg total) by mouth daily., Disp: 90 tablet, Rfl: 1   aspirin  EC 81 MG tablet, Take 1 tablet (81 mg total) by mouth daily. Swallow whole., Disp: 21 tablet, Rfl: 0   atorvastatin  (LIPITOR ) 40 MG tablet, TAKE 1 TABLET BY MOUTH EVERY MORNING, Disp: 30 tablet, Rfl: 10   carvedilol  (COREG ) 3.125 MG tablet, Take 1 tablet (3.125 mg total) by mouth 2 (two) times daily., Disp: 180 tablet, Rfl: 3   clopidogrel  (PLAVIX ) 75 MG tablet, Take 1 tablet (75 mg total) by mouth daily., Disp: 30 tablet, Rfl: 1   Continuous Glucose Sensor (FREESTYLE LIBRE 3 PLUS SENSOR) MISC, USE TO CHECK BLOOD SUGAR CONTINUOUSLY. CHANGE SENSOR EVERY 15 DAYS., Disp: 6 each, Rfl: 1   cyclobenzaprine  (FLEXERIL ) 5 MG tablet, TAKE 1 TABLET BY MOUTH EVERYDAY AT BEDTIME, Disp: 30 tablet, Rfl: 1   gabapentin  (NEURONTIN ) 300 MG capsule, TAKE 1-2 CAPSULES BY MOUTH IN THE EVENING. MAY CAUSE SEDATION, Disp: 180 capsule, Rfl: 1   insulin  degludec (TRESIBA  FLEXTOUCH) 100 UNIT/ML FlexTouch Pen, INJECT 40 UNITS SUBCUTANEOUSLY DAILY. Day supply 30., Disp: , Rfl:    losartan  (COZAAR ) 50 MG tablet, Take 1 tablet (50 mg total) by mouth daily., Disp: 90 tablet, Rfl: 3   metFORMIN  (GLUCOPHAGE -XR) 500 MG 24 hr tablet, TAKE 1 TABLET BY MOUTH 2 TIMES DAILY WITH A MEAL., Disp: 90 tablet, Rfl: 1   nitroGLYCERIN  (NITROSTAT ) 0.4 MG SL tablet, Place 1 tablet (0.4 mg total) under the tongue every 5 (five) minutes as needed for chest pain., Disp: 25 tablet, Rfl: 3   ondansetron  (ZOFRAN ) 4 MG tablet, Take 4 mg by mouth daily as needed for nausea or vomiting., Disp: , Rfl:    oxyCODONE -acetaminophen   (PERCOCET/ROXICET) 5-325 MG tablet, Take 0.5-1 tablets by mouth every 6 (six) hours as needed for severe pain., Disp: , Rfl:    pantoprazole  (PROTONIX ) 40 MG tablet, TAKE 1 TABLET BY MOUTH EVERY MORNING, Disp: 30 tablet, Rfl: 10   Semaglutide , 1 MG/DOSE, (OZEMPIC , 1 MG/DOSE,) 4 MG/3ML SOPN, INJECT 1 MG AS DIRECTED ONCE A WEEK., Disp: 3 mL, Rfl: 1   Study - LIBREXIA-STROKE - milvexian 25 mg or placebo tablet (PI-Sethi), Take 1 tablet by mouth 2 (two) times daily. Contact [facility] for any questions or concerns regarding this medication., Disp: 70 tablet, Rfl: 0   tamsulosin  (FLOMAX ) 0.4 MG CAPS capsule, Take 2 capsules (0.8 mg total) by mouth daily., Disp: 180 capsule, Rfl: 3   No orders of the defined types were placed in this encounter.    There are no discontinued medications.   ASSESSMENT AND PLAN: .      ICD-10-CM   1. Coronary artery disease of native artery of native heart with stable angina pectoris (HCC)  I25.118     2. 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 PFO (patent foramen ovale)  Q21.12     4. Mixed hyperlipidemia  E78.2      Assessment & Plan Coronary artery disease   He reports intermittent chest tightness prior to his stroke. A stress test is scheduled for June 12 to evaluate cardiac function. He is currently taking atorvastatin  40 mg daily, with LDL well controlled at 41 mg/dL. Perform stress test on June 12 and continue atorvastatin  40 mg daily.  Cerebellar stroke   He experienced a recent cerebellar stroke with resolved left-sided  weakness, likely related to patent foramen ovale (PFO) and potential atrial fibrillation (AFib). He is on aspirin  and clopidogrel . The decision on PFO closure is pending further evaluation and monitoring for AFib.  Potential risks of PFO closure, including stroke and myocardial infarction during the procedure, were discussed. Continue aspirin  and clopidogrel ,  monitor for AFib with a loop recorder, and discuss PFO closure with Dr. Ardella Beaver and consider doing TCD Bubble study to evaluate size of the shunting. Consider stopping aspirin  if the neurologist agrees and continue plavix  indefinitely.  I will see him back in 2 months to close the loop.  Patent foramen ovale (PFO)   PFO is identified as a potential cause of the recent stroke. The PFO mechanism and potential risks of closure were explained. The decision on closure is pending further evaluation and monitoring for AFib. A transcranial bubble study is considered to assess PFO size and significance. It was discussed that 10% of the population has a PFO, but not all cause strokes. Emphasize the importance of careful decision-making due to potential procedural risks. Discuss PFO closure with a neurologist in 4-6 weeks and consider a transcranial bubble study.  Mixed hyperlipidemia Patient is presently doing well and lipids are well-controlled.  Presently on semaglutide , metformin  and losartan  for diabetes control as well.  Hyperglycemia continues to be a major issue.  Could consider addition of Jardiance .  Type 2 diabetes mellitus   Diabetes management is affected by recent prednisone  use, causing elevated blood glucose levels. The current average blood glucose is 171 mg/dL. No further prednisone  is planned due to its impact on blood glucose. Could consider addition of Jardiance .  Obstructive sleep apnea   He reports regular use of CPAP. Emphasize the importance of CPAP use to reduce stroke risk and manage sleep apnea. Continue regular use of CPAP.   Signed,  Knox Perl, MD, [facility] 01/17/2024, 3:02 PM [facility] [address] [place], [place] [address] Phone: [phone]. Fax:  [phone]

## 2024-01-17 NOTE — Telephone Encounter (Signed)
 Also reviewed patient open gaps. He has not had colonoscopy. Looks like 09/28/22 visit with GI they were setting up but don't see any notes on it.

## 2024-01-17 NOTE — Patient Instructions (Addendum)
 Medication Instructions:  Your physician recommends that you continue on your current medications as directed. Please refer to the Current Medication list given to you today.  *If you need a refill on your cardiac medications before your next appointment, please call your pharmacy*  Lab Work: none If you have labs (blood work) drawn today and your tests are completely normal, you will receive your results only by: MyChart Message (if you have MyChart) OR A paper copy in the mail If you have any lab test that is abnormal or we need to change your treatment, we will call you to review the results.  Testing/Procedures: none  Follow-Up: At [facility], you and your health needs are our priority.  As part of our continuing mission to provide you with exceptional heart care, our providers are all part of one team.  This team includes your primary Cardiologist (physician) and Advanced Practice Providers or APPs (Physician Assistants and Nurse Practitioners) who all work together to provide you with the care you need, when you need it.  Your next appointment:   July 10 at 2:00  Provider:   Knox Perl, MD    We recommend signing up for the patient portal called "MyChart".  Sign up information is provided on this After Visit Summary.  MyChart is used to connect with patients for Virtual Visits (Telemedicine).  Patients are able to view lab/test results, encounter notes, upcoming appointments, etc.  Non-urgent messages can be sent to your provider as well.   To learn more about what you can do with MyChart, go to [email or web address].   Other Instructions

## 2024-01-19 ENCOUNTER — Ambulatory Visit (INDEPENDENT_AMBULATORY_CARE_PROVIDER_SITE_OTHER): Payer: Medicare HMO

## 2024-01-19 VITALS — BP 117/65 | Ht 70.0 in | Wt 205.0 lb

## 2024-01-19 DIAGNOSIS — Z0000 Encounter for general adult medical examination without abnormal findings: Secondary | ICD-10-CM

## 2024-01-19 DIAGNOSIS — Z1211 Encounter for screening for malignant neoplasm of colon: Secondary | ICD-10-CM

## 2024-01-19 NOTE — Progress Notes (Signed)
 Please attest and cosign this visit due to patients primary care provider not being in the office at the time the visit was completed.    Subjective:   Tony Manning is a 63 y.o. who presents for a Medicare Wellness preventive visit.  As a reminder, Annual Wellness Visits don't include a physical exam, and some assessments may be limited, especially if this visit is performed virtually. We may recommend an in-person visit if needed.  Visit Complete: Virtual I connected with  Tony Manning on 01/19/24 by a audio enabled telemedicine application and verified that I am speaking with the correct person using two identifiers.  Patient Location: Home  Provider Location: Office/Clinic  I discussed the limitations of evaluation and management by telemedicine. The patient expressed understanding and agreed to proceed.  Vital Signs: Because this visit was a virtual/telehealth visit, some criteria may be missing or patient reported. Any vitals not documented were not able to be obtained and vitals that have been documented are patient reported.  VideoDeclined- This patient declined Librarian, academic. Therefore the visit was completed with audio only.  Persons Participating in Visit: Patient.  AWV Questionnaire: No: Patient Medicare AWV questionnaire was not completed prior to this visit.  Cardiac Risk Factors include: advanced age (>53men, >67 women);diabetes mellitus;dyslipidemia;male gender;hypertension     Objective:     Today's Vitals   01/19/24 0853  BP: 117/65  Weight: 205 lb (93 kg)  Height: 5\' 10"  (1.778 m)  PainSc: 5    Body mass index is 29.41 kg/m.     01/19/2024    9:07 AM 01/06/2024    7:50 AM 01/04/2024    9:42 AM 12/20/2023    3:01 PM 03/01/2023    8:15 AM 11/04/2022   10:29 AM 09/20/2020    2:04 PM  Advanced Directives  Does Patient Have a Medical Advance Directive? Yes Yes Yes Yes Yes Yes No  Type of Special educational needs teacher of [place];Living will Living will;Healthcare Power of [employer] Power of [place];Living will Healthcare Power of [place];Living will Healthcare Power of [employer] of [place];Living will   Does patient want to make changes to medical advance directive?   No - Patient declined No - Patient declined No - Patient declined No - Patient declined   Copy of Healthcare Power of Attorney in Chart? Yes - validated most recent copy scanned in chart (See row information)   No - copy requested No - copy requested No - copy requested   Would patient like information on creating a medical advance directive?       No - Patient declined    Current Medications (verified) Outpatient Encounter Medications as of 01/19/2024  Medication Sig   amLODipine  (NORVASC ) 5 MG tablet Take 1 tablet (5 mg total) by mouth daily.   aspirin  EC 81 MG tablet Take 1 tablet (81 mg total) by mouth daily. Swallow whole.   atorvastatin  (LIPITOR ) 40 MG tablet TAKE 1 TABLET BY MOUTH EVERY MORNING   carvedilol  (COREG ) 3.125 MG tablet Take 1 tablet (3.125 mg total) by mouth 2 (two) times daily.   clopidogrel  (PLAVIX ) 75 MG tablet Take 1 tablet (75 mg total) by mouth daily.   Continuous Glucose Sensor (FREESTYLE LIBRE 3 PLUS SENSOR) MISC USE TO CHECK BLOOD SUGAR CONTINUOUSLY. CHANGE SENSOR EVERY 15 DAYS.   cyclobenzaprine  (FLEXERIL ) 5 MG tablet TAKE 1 TABLET BY MOUTH EVERYDAY AT BEDTIME   gabapentin  (NEURONTIN ) 300 MG capsule TAKE 1-2 CAPSULES BY  MOUTH IN THE EVENING. MAY CAUSE SEDATION   insulin  degludec (TRESIBA  FLEXTOUCH) 100 UNIT/ML FlexTouch Pen INJECT 40 UNITS SUBCUTANEOUSLY DAILY. Day supply 30.   losartan  (COZAAR ) 50 MG tablet Take 1 tablet (50 mg total) by mouth daily.   metFORMIN  (GLUCOPHAGE -XR) 500 MG 24 hr tablet TAKE 1 TABLET BY MOUTH 2 TIMES DAILY WITH A MEAL.   nitroGLYCERIN  (NITROSTAT ) 0.4 MG SL tablet Place 1 tablet (0.4 mg total) under the tongue every 5 (five) minutes as needed  for chest pain.   ondansetron  (ZOFRAN ) 4 MG tablet Take 4 mg by mouth daily as needed for nausea or vomiting.   oxyCODONE -acetaminophen  (PERCOCET/ROXICET) 5-325 MG tablet Take 0.5-1 tablets by mouth every 6 (six) hours as needed for severe pain.   pantoprazole  (PROTONIX ) 40 MG tablet TAKE 1 TABLET BY MOUTH EVERY MORNING   Semaglutide , 1 MG/DOSE, (OZEMPIC , 1 MG/DOSE,) 4 MG/3ML SOPN INJECT 1 MG AS DIRECTED ONCE A WEEK.   Study - LIBREXIA-STROKE - milvexian 25 mg or placebo tablet (PI-Sethi) Take 1 tablet by mouth 2 (two) times daily. Contact [facility] clinic for any questions or concerns regarding this medication.   tamsulosin  (FLOMAX ) 0.4 MG CAPS capsule Take 2 capsules (0.8 mg total) by mouth daily.   No facility-administered encounter medications on file as of 01/19/2024.    Allergies (verified) Ambien [zolpidem], Lisinopril, Metformin  and related, and Tramadol    History: Past Medical History:  Diagnosis Date   Anginal pain (HCC)    Basal cell carcinoma of face    Chest pain, unspecified    Chronic kidney disease    kidney stones   Coronary atherosclerosis of native coronary artery    s/p stent x5   Depression    Diabetes mellitus without mention of complication    Diverticulosis    Dyspnea    Dysrhythmia    Erectile dysfunction    Essential hypertension, benign    Family history of adverse reaction to anesthesia    GERD (gastroesophageal reflux disease)    Heart murmur    History of kidney stones    History of nephrolithiasis    Hyperlipidemia    Migraine    Myocardial infarction (HCC)    2016   Neuromuscular disorder (HCC)    Osteoarthritis    Pneumonia    Postsurgical percutaneous transluminal coronary angioplasty status    s/p stent x4   Sarcoidosis    Sleep apnea    uses CPAP   Unspecified sleep apnea    uses C-pap   Past Surgical History:  Procedure Laterality Date   ANTERIOR CERVICAL DECOMP/DISCECTOMY FUSION N/A 04/07/2017   Procedure:  ANTERIOR CERVICAL DECOMPRESSION FUSION, CERVICAL 4-5 WITH INSTRUMENTATION AND ALLOGRAFT;  Surgeon: Virl Grimes, MD;  Location: [facility] OR;  Service: Orthopedics;  Laterality: N/A;  ANTERIOR CERVICAL DECOMPRESSION FUSION, CERVICAL 4-5 WITH INSTRUMENTATION AND ALLOGRAFT; REQUEST 2.5 HOURS AND FLIP ROOM   APPENDECTOMY  2005   CARPAL TUNNEL RELEASE     CERVICAL FUSION  2009   COLONOSCOPY     CORONARY ANGIOPLASTY WITH STENT PLACEMENT     2004   CORONARY STENT INTERVENTION N/A 10/16/2016   Procedure: Coronary Stent Intervention;  Surgeon: Knox Perl, MD;  [facility] INVASIVE CV LAB;  Service: Cardiovascular;  Laterality: N/A;   CYSTOSCOPY WITH RETROGRADE PYELOGRAM, URETEROSCOPY AND STENT PLACEMENT Right 05/12/2019   Procedure: CYSTOSCOPY WITH RIGHT RETROGRADE PYELOGRAM, URETEROSCOPY HOLMIUM LASER AND STENT PLACEMENT;  Surgeon: Samson Croak, MD;  Location: [facility] ORS;  Service: Urology;  Laterality: Right;  ELBOW SURGERY     bilateral   HAND SURGERY Left    KNEE SURGERY Bilateral    Bilateral    LEFT HEART CATH AND CORONARY ANGIOGRAPHY N/A 10/16/2016   Procedure: Left Heart Cath and Coronary Angiography;  Surgeon: Knox Perl, MD;  Location: [facility] INVASIVE CV LAB;  Service: Cardiovascular;  Laterality: N/A;   LEFT HEART CATHETERIZATION WITH CORONARY ANGIOGRAM N/A 06/14/2014   Procedure: LEFT HEART CATHETERIZATION WITH CORONARY ANGIOGRAM;  Surgeon: Jessica Morn, MD;  Location: [facility] CATH LAB;  Service: Cardiovascular;  Laterality: N/A;   LOOP RECORDER INSERTION N/A 01/06/2024   Procedure: LOOP RECORDER INSERTION;  Surgeon: Tylene Galla, PA-C;  Location: [facility] INVASIVE CV LAB;  Service: Cardiovascular;  Laterality: N/A;   LUNG BIOPSY     sarcoid   ROTATOR CUFF REPAIR     left   SHOULDER ARTHROSCOPY WITH SUBACROMIAL DECOMPRESSION Left 06/16/2018   Procedure: LEFT SHOULDER ARTHROSCOPY WITH ROTATOR CUFF DEBRIDEMENT AND  SUBACROMIAL DECOMPRESSION;  Surgeon: Sammye Cristal, MD;  Location: [facility] OR;   Service: Orthopedics;  Laterality: Left;   TRANSESOPHAGEAL ECHOCARDIOGRAM (CATH LAB) N/A 01/06/2024   Procedure: TRANSESOPHAGEAL ECHOCARDIOGRAM;  Surgeon: Jann Melody, MD;  Location: [facility] INVASIVE CV LAB;  Service: Cardiovascular;  Laterality: N/A;   Family History  Problem Relation Age of Onset   Arthritis Mother    Depression Mother    Stroke Mother    Arthritis Father    Diabetes Father    Coronary artery disease Father    Colon cancer Neg Hx    Prostate cancer Neg Hx    Esophageal cancer Neg Hx    Rectal cancer Neg Hx    Stomach cancer Neg Hx    Social History   Socioeconomic History   Marital status: Married    Spouse name: Not on file   Number of children: 2   Years of education: Not on file   Highest education level: Not on file  Occupational History   Occupation: disabled    Employer: unemployed  Tobacco Use   Smoking status: Never   Smokeless tobacco: Former    Types: Chew    Quit date: 02/06/2016  Vaping Use   Vaping status: Never Used  Substance and Sexual Activity   Alcohol use: No    Alcohol/week: 0.0 standard drinks of alcohol   Drug use: No   Sexual activity: Yes    Birth control/protection: Condom  Other Topics Concern   Not on file  Social History Narrative   Married, [phone] kids   On disability   Social Drivers of Health   Financial Resource Strain: Low Risk  (01/19/2024)   Overall Financial Resource Strain (CARDIA)    Difficulty of Paying Living Expenses: Not hard at all  Food Insecurity: No Food Insecurity (01/19/2024)   Hunger Vital Sign    Worried About Running Out of Food in the Last Year: Never true    Ran Out of Food in the Last Year: Never true  Transportation Needs: No Transportation Needs (01/19/2024)   PRAPARE - Administrator, Civil Service (Medical): No    Lack of Transportation (Non-Medical): No  Physical Activity: Insufficiently Active (01/19/2024)   Exercise Vital Sign    Days of Exercise per Week: 5 days     Minutes of Exercise per Session: 20 min  Stress: No Stress Concern Present (01/19/2024)   [employer] of Occupational Health - Occupational Stress Questionnaire    Feeling of Stress : Not at all  Social Connections: Moderately Isolated (01/19/2024)   Social Connection and Isolation Panel [NHANES]    Frequency of Communication with Friends and Family: Three times a week    Frequency of Social Gatherings with Friends and Family: More than three times a week    Attends Religious Services: Never    Database administrator or Organizations: No    Attends Engineer, structural: Never    Marital Status: Married    Tobacco Counseling Counseling given: Not Answered    Clinical Intake:  Pre-visit preparation completed: Yes  Pain : 0-10 Pain Score: 5  Pain Type: Chronic pain Pain Location: Back (rt hip) Pain Orientation: Lower Pain Descriptors / Indicators: Aching Pain Onset: More than a month ago Pain Frequency: Intermittent Pain Relieving Factors: medications, heat,ice Effect of Pain on Daily Activities: sleeping difficulty when hurting  Pain Relieving Factors: medications, heat,ice  BMI - recorded: 29.41 Nutritional Status: BMI 25 -29 Overweight Nutritional Risks: None Diabetes: Yes CBG done?: Yes (BS 1114 this am) CBG resulted in Enter/ Edit results?: No Did pt. bring in CBG monitor from home?: No  Lab Results  Component Value Date   HGBA1C 8.4 (H) 11/12/2023   HGBA1C 7.7 (H) 06/11/2023   HGBA1C 7.2 (H) 03/01/2023     How often do you need to have someone help you when you read instructions, pamphlets, or other written materials from your doctor or pharmacy?: 1 - Never  Interpreter Needed?: No  Comments: lives with wife Information entered by :: B.Trayveon Beckford,LPN   Activities of Daily Living     01/19/2024    9:07 AM 01/04/2024   11:33 AM  In your present state of health, do you have any difficulty performing the following activities:  Hearing? 0 0   Vision? 0 0  Difficulty concentrating or making decisions? 1 0  Comment remembering things   Walking or climbing stairs? 1   Dressing or bathing? 0   Doing errands, shopping? 0 0  Preparing Food and eating ? N   Using the Toilet? N   In the past six months, have you accidently leaked urine? N   Do you have problems with loss of bowel control? N   Managing your Medications? N   Managing your Finances? N   Housekeeping or managing your Housekeeping? N     Patient Care Team: Donnie Galea, MD as PCP - General (Family Medicine) Knox Perl, MD as PCP - Cardiology (Cardiology) Knox Perl, MD as Consulting Physician (Cardiology) Daron Ellen, [facility] as Pharmacist (Pharmacist) Green, Davina E, RN as [facility], [place], Davina E, RN  Indicate any recent Medical Services you may have received from other than [facility] providers in the past year (date may be approximate).     Assessment:    This is a routine wellness examination for [place].  Hearing/Vision screen Hearing Screening - Comments:: Pt says his hearing is a little less but ok Vision Screening - Comments:: Pt says his vision is good just needs readers   Goals Addressed             This Visit's Progress    Patient Stated   On track    01/19/24-, I will maintain and continue medications as prescribed.      Patient Stated       I want to lose 10lbs and eat healthier       Depression Screen     01/19/2024    9:03 AM 01/10/2024  12:06 PM 11/12/2023    3:13 PM 09/10/2023    9:26 AM 07/29/2023    2:03 PM 06/11/2023    9:03 AM 05/20/2023    8:23 AM  PHQ 2/9 Scores  PHQ - 2 Score 0 0 0 0 1 1 1   PHQ- 9 Score  2 2 2 3 3 2     Fall Risk     01/19/2024    8:59 AM 01/10/2024   12:06 PM 12/20/2023    3:01 PM 11/12/2023    3:13 PM 09/10/2023    9:26 AM  Fall Risk   Falls in the past year? 1 0 0 1 1  Number falls in past yr: 1 0 0 1 1  Injury with Fall? 0 0 0 1 1  Risk for fall due to : No  Fall Risks;Impaired balance/gait No Fall Risks Other (Comment) History of fall(s) History of fall(s)  Risk for fall due to: Comment   chronic back/ bilateral hip pain. Denies impaired mobility or gait.    Follow up Education provided;Falls prevention discussed Falls evaluation completed  Falls evaluation completed Falls evaluation completed    MEDICARE RISK AT HOME:  Medicare Risk at Home Any stairs in or around the home?: Yes If so, are there any without handrails?: Yes Home free of loose throw rugs in walkways, pet beds, electrical cords, etc?: Yes Adequate lighting in your home to reduce risk of falls?: Yes Life alert?: No Use of a cane, walker or w/c?: No Grab bars in the bathroom?: No Shower chair or bench in shower?: No Elevated toilet seat or a handicapped toilet?: Yes  TIMED UP AND GO:  Was the test performed?  No  Cognitive Function: 6CIT completed    09/20/2020    2:10 PM  MMSE - Mini Mental State Exam  Orientation to time 5  Orientation to Place 5  Registration 3  Attention/ Calculation 5  Recall 3  Language- repeat 1        01/19/2024    9:10 AM 09/22/2022    9:19 AM  6CIT Screen  What Year? 0 points 0 points  What month? 0 points 0 points  What time? 0 points 0 points  Count back from 20 0 points 0 points  Months in reverse 0 points 0 points  Repeat phrase 0 points 0 points  Total Score 0 points 0 points    Immunizations Immunization History  Administered Date(s) Administered   Fluad Quad(high Dose 65+) 06/12/2022   Influenza Inj Mdck Quad Pf 06/02/2018   Influenza Whole 09/17/2010   Influenza, Seasonal, Injecte, Preservative Fre 07/29/2023   Influenza,inj,Quad PF,6+ Mos 06/08/2013, 05/22/2014, 05/12/2016, 05/08/2019, 05/27/2020   Influenza-Unspecified 06/08/2013, 05/22/2014, 06/08/2015, 05/12/2016, 05/21/2017, 06/02/2018, 05/09/2019, 05/21/2021   PFIZER(Purple Top)SARS-COV-2 Vaccination 11/27/2019, 12/25/2019, 08/07/2020, 08/19/2020, 05/21/2021    Pfizer Covid-19 Vaccine Bivalent Booster 68yrs & up 05/21/2021   Pneumococcal Polysaccharide-23 09/08/2007, 02/16/2013   Td 09/07/2009   Td (Adult), 2 Lf Tetanus Toxid, Preservative Free 09/07/2009   Zoster Recombinant(Shingrix) 05/29/2022, 08/18/2022   Zoster, Live 12/16/2015    Screening Tests Health Maintenance  Topic Date Due   Pneumococcal Vaccine 13-58 Years old (3 of 3 - PCV) 02/16/2014   Colonoscopy  03/23/2018   DTaP/Tdap/Td (2 - Tdap) 09/08/2019   Diabetic kidney evaluation - Urine ACR  09/12/2023   COVID-19 Vaccine (6 - 2024-25 season) 06/26/2028 (Originally 05/09/2023)   INFLUENZA VACCINE  04/07/2024   HEMOGLOBIN A1C  05/14/2024   FOOT EXAM  07/28/2024   OPHTHALMOLOGY  EXAM  12/08/2024   Diabetic kidney evaluation - eGFR measurement  01/05/2025   Medicare Annual Wellness (AWV)  01/18/2025   Hepatitis C Screening  Completed   HIV Screening  Completed   Zoster Vaccines- Shingrix  Completed   HPV VACCINES  Aged Out   Meningococcal B Vaccine  Aged Out    Health Maintenance  Health Maintenance Due  Topic Date Due   Pneumococcal Vaccine 32-75 Years old (3 of 3 - PCV) 02/16/2014   Colonoscopy  03/23/2018   DTaP/Tdap/Td (2 - Tdap) 09/08/2019   Diabetic kidney evaluation - Urine ACR  09/12/2023   Health Maintenance Items Addressed: Referral sent to GI for colonoscopy  Additional Screening:  Vision Screening: Recommended annual ophthalmology exams for early detection of glaucoma and other disorders of the eye.  Dental Screening: Recommended annual dental exams for proper oral hygiene  Community Resource Referral / Chronic Care Management: CRR required this visit?  No   CCM required this visit?  Appt scheduled with PCP   Plan:    I have personally reviewed and noted the following in the patient's chart:   Medical and social history Use of alcohol, tobacco or illicit drugs  Current medications and supplements including opioid prescriptions. Patient is  currently taking opioid prescriptions. Information provided to patient regarding non-opioid alternatives. Patient advised to discuss non-opioid treatment plan with their provider. Functional ability and status Nutritional status Physical activity Advanced directives List of other physicians Hospitalizations, surgeries, and ER visits in previous 12 months Vitals Screenings to include cognitive, depression, and falls Referrals and appointments  In addition, I have reviewed and discussed with patient certain preventive protocols, quality metrics, and best practice recommendations. A written personalized care plan for preventive services as well as general preventive health recommendations were provided to patient.   Nerissa Bannister, LPN   02/25/3085   After Visit Summary: (MyChart) Due to this being a telephonic visit, the after visit summary with patients personalized plan was offered to patient via MyChart   Notes: Nothing significant to report at this time.

## 2024-01-19 NOTE — Telephone Encounter (Signed)
GI referral has been placed 

## 2024-01-19 NOTE — Patient Instructions (Signed)
 Mr. Anstey , Thank you for taking time out of your busy schedule to complete your Annual Wellness Visit with me. I enjoyed our conversation and look forward to speaking with you again next year. I, as well as your care team,  appreciate your ongoing commitment to your health goals. Please review the following plan we discussed and let me know if I can assist you in the future. Your Game plan/ To Do List    Referrals: If you haven't heard from the office you've been referred to, please reach out to them at the phone provided.  Colonoscopy: [facility] [address] [place] [address] [place],  [place]  [address] Main: [phone]   Follow up Visits: Next Medicare AWV with our clinical staff: 01/19/25 @ 8:50am televisit   Have you seen your provider in the last 6 months (3 months if uncontrolled diabetes)? Yes Next Office Visit with your provider: 04/25/24  Clinician Recommendations:  Aim for 30 minutes of exercise or brisk walking, 6-8 glasses of water, and 5 servings of fruits and vegetables each day.       This is a list of the screening recommended for you and due dates:  Health Maintenance  Topic Date Due   Pneumococcal Vaccination (3 of 3 - PCV) 02/16/2014   Colon Cancer Screening  03/23/2018   DTaP/Tdap/Td vaccine (2 - Tdap) 09/08/2019   Yearly kidney health urinalysis for diabetes  09/12/2023   COVID-19 Vaccine (6 - 2024-25 season) 06/26/2028*   Flu Shot  04/07/2024   Hemoglobin A1C  05/14/2024   Complete foot exam   07/28/2024   Eye exam for diabetics  12/08/2024   Yearly kidney function blood test for diabetes  01/05/2025   Medicare Annual Wellness Visit  01/18/2025   Hepatitis C Screening  Completed   HIV Screening  Completed   Zoster (Shingles) Vaccine  Completed   HPV Vaccine  Aged Out   Meningitis B Vaccine  Aged Out  *Topic was postponed. The date shown is not the original due date.    Advanced directives: (Copy Requested) Please bring a copy of your  health care power of attorney and living will to the office to be added to your chart at your convenience. You can mail to [facility] [address] [place], [place] [address] or email to [email or web address] Advance Care Planning is important because it:  [x]  Makes sure you receive the medical care that is consistent with your values, goals, and preferences  [x]  It provides guidance to your family and loved ones and reduces their decisional burden about whether or not they are making the right decisions based on your wishes.  Follow the link provided in your after visit summary or read over the paperwork we have mailed to you to help you started getting your Advance Directives in place. If you need assistance in completing these, please reach out to us  so that we can help you!

## 2024-01-20 ENCOUNTER — Encounter: Payer: Self-pay | Admitting: Internal Medicine

## 2024-01-20 ENCOUNTER — Ambulatory Visit: Payer: Self-pay

## 2024-01-20 ENCOUNTER — Ambulatory Visit (INDEPENDENT_AMBULATORY_CARE_PROVIDER_SITE_OTHER): Admitting: Internal Medicine

## 2024-01-20 VITALS — BP 124/70 | HR 77 | Temp 98.4°F | Ht 70.0 in | Wt 207.0 lb

## 2024-01-20 DIAGNOSIS — J069 Acute upper respiratory infection, unspecified: Secondary | ICD-10-CM | POA: Insufficient documentation

## 2024-01-20 NOTE — Progress Notes (Signed)
 Subjective:    Patient ID: Tony Manning, male    DOB: 1961-08-16, 63 y.o.   MRN: [id number]  HPI Here due to respiratory symptoms  Started with sinus problems---drainage in back of throat Some burning in chest like past bronchitis Started yesterday No fever--but felt warm last night. No sweats or chills No sig cough Post nasal drip but not runny nose Slightly sore throat No ear pain No headache  No meds for this  Current Outpatient Medications on File Prior to Visit  Medication Sig Dispense Refill   amLODipine  (NORVASC ) 5 MG tablet Take 1 tablet (5 mg total) by mouth daily. 90 tablet 1   aspirin  EC 81 MG tablet Take 1 tablet (81 mg total) by mouth daily. Swallow whole. 21 tablet 0   atorvastatin  (LIPITOR ) 40 MG tablet TAKE 1 TABLET BY MOUTH EVERY MORNING 30 tablet 10   carvedilol  (COREG ) 3.125 MG tablet Take 1 tablet (3.125 mg total) by mouth 2 (two) times daily. 180 tablet 3   clopidogrel  (PLAVIX ) 75 MG tablet Take 1 tablet (75 mg total) by mouth daily. 30 tablet 1   Continuous Glucose Sensor (FREESTYLE LIBRE 3 PLUS SENSOR) MISC USE TO CHECK BLOOD SUGAR CONTINUOUSLY. CHANGE SENSOR EVERY 15 DAYS. 6 each 1   cyclobenzaprine  (FLEXERIL ) 5 MG tablet TAKE 1 TABLET BY MOUTH EVERYDAY AT BEDTIME 30 tablet 1   gabapentin  (NEURONTIN ) 300 MG capsule TAKE 1-2 CAPSULES BY MOUTH IN THE EVENING. MAY CAUSE SEDATION 180 capsule 1   insulin  degludec (TRESIBA  FLEXTOUCH) 100 UNIT/ML FlexTouch Pen INJECT 40 UNITS SUBCUTANEOUSLY DAILY. Day supply 30.     losartan  (COZAAR ) 50 MG tablet Take 1 tablet (50 mg total) by mouth daily. 90 tablet 3   metFORMIN  (GLUCOPHAGE -XR) 500 MG 24 hr tablet TAKE 1 TABLET BY MOUTH 2 TIMES DAILY WITH A MEAL. 90 tablet 1   nitroGLYCERIN  (NITROSTAT ) 0.4 MG SL tablet Place 1 tablet (0.4 mg total) under the tongue every 5 (five) minutes as needed for chest pain. 25 tablet 3   ondansetron  (ZOFRAN ) 4 MG tablet Take 4 mg by mouth daily as needed for nausea or vomiting.      oxyCODONE -acetaminophen  (PERCOCET/ROXICET) 5-325 MG tablet Take 0.5-1 tablets by mouth every 6 (six) hours as needed for severe pain.     pantoprazole  (PROTONIX ) 40 MG tablet TAKE 1 TABLET BY MOUTH EVERY MORNING 30 tablet 10   Semaglutide , 1 MG/DOSE, (OZEMPIC , 1 MG/DOSE,) 4 MG/3ML SOPN INJECT 1 MG AS DIRECTED ONCE A WEEK. 3 mL 1   Study - LIBREXIA-STROKE - milvexian 25 mg or placebo tablet (PI-Sethi) Take 1 tablet by mouth 2 (two) times daily. Contact [facility] for any questions or concerns regarding this medication. 70 tablet 0   tamsulosin  (FLOMAX ) 0.4 MG CAPS capsule Take 2 capsules (0.8 mg total) by mouth daily. 180 capsule 3   No current facility-administered medications on file prior to visit.    Allergies  Allergen Reactions   Ambien [Zolpidem] Other (See Comments)    Parasomnias, sleep walking   Lisinopril Cough   Metformin  And Related Other (See Comments)    GI upset with immediate release form   Tramadol  Diarrhea    Past Medical History:  Diagnosis Date   Anginal pain (HCC)    Basal cell carcinoma of face    Chest pain, unspecified    Chronic kidney disease    kidney stones   Coronary atherosclerosis of native coronary artery    s/p stent x5  Depression    Diabetes mellitus without mention of complication    Diverticulosis    Dyspnea    Dysrhythmia    Erectile dysfunction    Essential hypertension, benign    Family history of adverse reaction to anesthesia    GERD (gastroesophageal reflux disease)    Heart murmur    History of kidney stones    History of nephrolithiasis    Hyperlipidemia    Migraine    Myocardial infarction (HCC)    2016   Neuromuscular disorder (HCC)    Osteoarthritis    Pneumonia    Postsurgical percutaneous transluminal coronary angioplasty status    s/p stent x4   Sarcoidosis    Sleep apnea    uses CPAP   Unspecified sleep apnea    uses C-pap    Past Surgical History:  Procedure Laterality Date    ANTERIOR CERVICAL DECOMP/DISCECTOMY FUSION N/A 04/07/2017   Procedure: ANTERIOR CERVICAL DECOMPRESSION FUSION, CERVICAL 4-5 WITH INSTRUMENTATION AND ALLOGRAFT;  Surgeon: Virl Grimes, MD;  Location: [facility] OR;  Service: Orthopedics;  Laterality: N/A;  ANTERIOR CERVICAL DECOMPRESSION FUSION, CERVICAL 4-5 WITH INSTRUMENTATION AND ALLOGRAFT; REQUEST 2.5 HOURS AND FLIP ROOM   APPENDECTOMY  2005   CARPAL TUNNEL RELEASE     CERVICAL FUSION  2009   COLONOSCOPY     CORONARY ANGIOPLASTY WITH STENT PLACEMENT     2004   CORONARY STENT INTERVENTION N/A 10/16/2016   Procedure: Coronary Stent Intervention;  Surgeon: Knox Perl, MD;  Location[facility] INVASIVE CV LAB;  Service: Cardiovascular;  Laterality: N/A;   CYSTOSCOPY WITH RETROGRADE PYELOGRAM, URETEROSCOPY AND STENT PLACEMENT Right 05/12/2019   Procedure: CYSTOSCOPY WITH RIGHT RETROGRADE PYELOGRAM, URETEROSCOPY HOLMIUM LASER AND STENT PLACEMENT;  Surgeon: Samson Croak, MD;  Location: [facility] ORS;  Service: Urology;  Laterality: Right;   ELBOW SURGERY     bilateral   HAND SURGERY Left    KNEE SURGERY Bilateral    Bilateral    LEFT HEART CATH AND CORONARY ANGIOGRAPHY N/A 10/16/2016   Procedure: Left Heart Cath and Coronary Angiography;  Surgeon: Knox Perl, MD;  Location: [facility] INVASIVE CV LAB;  Service: Cardiovascular;  Laterality: N/A;   LEFT HEART CATHETERIZATION WITH CORONARY ANGIOGRAM N/A 06/14/2014   Procedure: LEFT HEART CATHETERIZATION WITH CORONARY ANGIOGRAM;  Surgeon: Jessica Morn, MD;  Location: [facility] CATH LAB;  Service: Cardiovascular;  Laterality: N/A;   LOOP RECORDER INSERTION N/A 01/06/2024   Procedure: LOOP RECORDER INSERTION;  Surgeon: Tylene Galla, PA-C;  Location: [facility] INVASIVE CV LAB;  Service: Cardiovascular;  Laterality: N/A;   LUNG BIOPSY     sarcoid   ROTATOR CUFF REPAIR     left   SHOULDER ARTHROSCOPY WITH SUBACROMIAL DECOMPRESSION Left 06/16/2018   Procedure: LEFT SHOULDER ARTHROSCOPY WITH ROTATOR CUFF DEBRIDEMENT AND   SUBACROMIAL DECOMPRESSION;  Surgeon: Sammye Cristal, MD;  Location: [facility] OR;  Service: Orthopedics;  Laterality: Left;   TRANSESOPHAGEAL ECHOCARDIOGRAM (CATH LAB) N/A 01/06/2024   Procedure: TRANSESOPHAGEAL ECHOCARDIOGRAM;  Surgeon: Jann Melody, MD;  Location: [facility] INVASIVE CV LAB;  Service: Cardiovascular;  Laterality: N/A;    Family History  Problem Relation Age of Onset   Arthritis Mother    Depression Mother    Stroke Mother    Arthritis Father    Diabetes Father    Coronary artery disease Father    Colon cancer Neg Hx    Prostate cancer Neg Hx    Esophageal cancer Neg Hx    Rectal cancer Neg Hx    Stomach  cancer Neg Hx     Social History   Socioeconomic History   Marital status: Married    Spouse name: Not on file   Number of children: 2   Years of education: Not on file   Highest education level: Not on file  Occupational History   Occupation: disabled    Employer: unemployed  Tobacco Use   Smoking status: Never   Smokeless tobacco: Former    Types: Chew    Quit date: 02/06/2016  Vaping Use   Vaping status: Never Used  Substance and Sexual Activity   Alcohol use: No    Alcohol/week: 0.0 standard drinks of alcohol   Drug use: No   Sexual activity: Yes    Birth control/protection: Condom  Other Topics Concern   Not on file  Social History Narrative   Married, [phone] kids   On disability   Social Drivers of Health   Financial Resource Strain: Low Risk  (01/19/2024)   Overall Financial Resource Strain (CARDIA)    Difficulty of Paying Living Expenses: Not hard at all  Food Insecurity: No Food Insecurity (01/19/2024)   Hunger Vital Sign    Worried About Running Out of Food in the Last Year: Never true    Ran Out of Food in the Last Year: Never true  Transportation Needs: No Transportation Needs (01/19/2024)   PRAPARE - Administrator, Civil Service (Medical): No    Lack of Transportation (Non-Medical): No  Physical Activity: Insufficiently  Active (01/19/2024)   Exercise Vital Sign    Days of Exercise per Week: 5 days    Minutes of Exercise per Session: 20 min  Stress: No Stress Concern Present (01/19/2024)   [employer] of Occupational Health - Occupational Stress Questionnaire    Feeling of Stress : Not at all  Social Connections: Moderately Isolated (01/19/2024)   Social Connection and Isolation Panel [NHANES]    Frequency of Communication with Friends and Family: Three times a week    Frequency of Social Gatherings with Friends and Family: More than three times a week    Attends Religious Services: Never    Database administrator or Organizations: No    Attends Banker Meetings: Never    Marital Status: Married  Catering manager Violence: Not At Risk (01/19/2024)   Humiliation, Afraid, Rape, and Kick questionnaire    Fear of Current or Ex-Partner: No    Emotionally Abused: No    Physically Abused: No    Sexually Abused: No   Review of Systems No change in smell or taste No N/V Is able to eat     Objective:   Physical Exam Constitutional:      Appearance: Normal appearance.  HENT:     Head:     Comments: No sinus tenderness    Right Ear: Tympanic membrane and ear canal normal.     Left Ear: Tympanic membrane and ear canal normal.     Mouth/Throat:     Pharynx: No oropharyngeal exudate or posterior oropharyngeal erythema.  Pulmonary:     Effort: Pulmonary effort is normal.     Breath sounds: Normal breath sounds. No wheezing or rales.  Musculoskeletal:     Cervical back: Neck supple.  Lymphadenopathy:     Cervical: No cervical adenopathy.  Neurological:     Mental Status: He is alert.            Assessment & Plan:

## 2024-01-20 NOTE — Telephone Encounter (Signed)
  Chief Complaint: sinus congestion Symptoms: chest feels like he is going to have an infection- feels tigghter adna burning sensation, PND Frequency: last night  Pertinent Negatives: Patient denies fever, face pain Disposition: [] ED /[] Urgent Care (no appt availability in office) / [x] Appointment(In office/virtual)/ []  Post Virtual Care/ [] Home Care/ [] Refused Recommended Disposition /[] [place] [facility]/ []  Follow-up with PCP Additional Notes: scheduled as an acute visit  > 14 days out to have a hospital f/u- pt stated he just had a CVA and was advised to call PCP for any medical concerns or medication recommendations Copied from CRM [id number]. Topic: Clinical - Medical Advice >> Jan 20, 2024 11:59 AM Albertha Alosa wrote: Reason for CRM: Patient called in regarding believing he has a sinus infection, wanted a nurse to call him back on what he should take   [phone] Reason for Disposition  [1] Sinus congestion as part of a cold AND [2] present < 10 days  Answer Assessment - Initial Assessment Questions 1. LOCATION: "Where does it hurt?"      no 2. ONSET: "When did the sinus pain start?"  (e.g., hours, days)      Last night 3. SEVERITY: "How bad is the pain?"   (Scale 1-10; mild, moderate or severe)   - MILD (1-3): doesn't interfere with normal activities    - MODERATE (4-7): interferes with normal activities (e.g., work or school) or awakens from sleep   - SEVERE (8-10): excruciating pain and patient unable to do any normal activities        N/a 4. RECURRENT SYMPTOM: "Have you ever had sinus problems before?" If Yes, ask: "When was the last time?" and "What happened that time?"      yes 5. NASAL CONGESTION: "Is the nose blocked?" If Yes, ask: "Can you open it or must you breathe through your mouth?"     no 6. NASAL DISCHARGE: "Do you have discharge from your nose?" If so ask, "What color?"     white 7. FEVER: "Do you have a fever?" If Yes, ask: "What is it, how was it measured,  and when did it start?"      no 8. OTHER SYMPTOMS: "Do you have any other symptoms?" (e.g., sore throat, cough, earache, difficulty breathing)     Stopped up, tight in chest tighter to breath  Protocols used: Sinus Pain or Congestion-A-AH

## 2024-01-20 NOTE — Assessment & Plan Note (Signed)
 Discussed and it seems to be self limited viral uri Supportive care--tylenol , cough med, rest If worsens, with sinus symptoms next week--would try empiric antibiotic

## 2024-01-20 NOTE — Telephone Encounter (Signed)
 Pt saw Dr Joelle Musca at 2pm.

## 2024-01-20 NOTE — Telephone Encounter (Signed)
 Please see about getting patient scheduled if sx persist.  Thanks.

## 2024-01-21 NOTE — Telephone Encounter (Signed)
 FYI

## 2024-01-21 NOTE — Telephone Encounter (Signed)
 Noted. Thanks.

## 2024-01-23 ENCOUNTER — Other Ambulatory Visit: Payer: Self-pay | Admitting: Family Medicine

## 2024-01-23 DIAGNOSIS — E1149 Type 2 diabetes mellitus with other diabetic neurological complication: Secondary | ICD-10-CM

## 2024-01-25 ENCOUNTER — Ambulatory Visit (HOSPITAL_BASED_OUTPATIENT_CLINIC_OR_DEPARTMENT_OTHER): Attending: Nurse Practitioner

## 2024-01-25 DIAGNOSIS — G4730 Sleep apnea, unspecified: Secondary | ICD-10-CM

## 2024-01-26 DIAGNOSIS — M47812 Spondylosis without myelopathy or radiculopathy, cervical region: Secondary | ICD-10-CM | POA: Diagnosis not present

## 2024-01-26 DIAGNOSIS — M542 Cervicalgia: Secondary | ICD-10-CM | POA: Diagnosis not present

## 2024-01-26 DIAGNOSIS — M545 Low back pain, unspecified: Secondary | ICD-10-CM | POA: Diagnosis not present

## 2024-01-26 DIAGNOSIS — M533 Sacrococcygeal disorders, not elsewhere classified: Secondary | ICD-10-CM | POA: Diagnosis not present

## 2024-01-26 DIAGNOSIS — M25551 Pain in right hip: Secondary | ICD-10-CM | POA: Diagnosis not present

## 2024-01-26 DIAGNOSIS — M25552 Pain in left hip: Secondary | ICD-10-CM | POA: Diagnosis not present

## 2024-01-26 DIAGNOSIS — Z79891 Long term (current) use of opiate analgesic: Secondary | ICD-10-CM | POA: Diagnosis not present

## 2024-01-27 ENCOUNTER — Encounter: Payer: Self-pay | Admitting: Family Medicine

## 2024-01-27 ENCOUNTER — Ambulatory Visit (INDEPENDENT_AMBULATORY_CARE_PROVIDER_SITE_OTHER): Admitting: Family Medicine

## 2024-01-27 VITALS — BP 126/68 | HR 67 | Temp 98.1°F | Ht 70.0 in | Wt 208.2 lb

## 2024-01-27 DIAGNOSIS — Z794 Long term (current) use of insulin: Secondary | ICD-10-CM | POA: Diagnosis not present

## 2024-01-27 DIAGNOSIS — E1149 Type 2 diabetes mellitus with other diabetic neurological complication: Secondary | ICD-10-CM | POA: Diagnosis not present

## 2024-01-27 DIAGNOSIS — Z7985 Long-term (current) use of injectable non-insulin antidiabetic drugs: Secondary | ICD-10-CM

## 2024-01-27 DIAGNOSIS — Z8673 Personal history of transient ischemic attack (TIA), and cerebral infarction without residual deficits: Secondary | ICD-10-CM | POA: Diagnosis not present

## 2024-01-27 MED ORDER — METFORMIN HCL ER 500 MG PO TB24: 500.0000 mg | ORAL_TABLET | Freq: Two times a day (BID) | ORAL | Status: DC

## 2024-01-27 MED ORDER — METFORMIN HCL ER 500 MG PO TB24: 1000.0000 mg | ORAL_TABLET | Freq: Every day | ORAL | Status: DC

## 2024-01-27 NOTE — Assessment & Plan Note (Signed)
 No change in meds.  Letter given to return to work.  No known contraindication to return to work at this point.

## 2024-01-27 NOTE — Patient Instructions (Signed)
 Try taking metformin  2 tabs at night.  Let me know if not tolerated.  Then try increasing your ozempic  to 2mg  when you run out of 1mg  dose.  Let me and Talbot Factor know how that is doing, in terms of tolerance and effect.  We'll go from there.  Take care.  Glad to see you.

## 2024-01-27 NOTE — Progress Notes (Signed)
 BP has been controlled at home.   Sugar has been elevated at home.    Diabetes:  Using medications without difficulties: yes Hypoglycemic episodes: no Hyperglycemic episodes: higher sugars, averaging 200s in the evenings Feet problems: tingling in the R leg at baseline, likely from his back.   Blood Sugars averaging: see above.  eye exam within last year:yes He is going to increase to 2mg  ozempic  after current pen runs out.   He has 2 month supply of 2mg  ozempic .   Appetite isn't lower with 1mg  dose ozempic . He had to increase tresiba  to 47 units, in the AMs.  D/w pt about going back to work.  Working at [employer].  He is out until 02/08/24, but he has improved in the meantime.  He is driving.  He doesn't have physical limitation that would prevent working at this point.  Note given to patient.    Meds, vitals, and allergies reviewed.   ROS: Per HPI unless specifically indicated in ROS section   GEN: nad, alert and oriented HEENT: ncat NECK: supple w/o LA CV: rrr. PULM: ctab, no inc wob ABD: soft, +bs EXT: no edema SKIN: well perfused.   CN 2-12 wnl B, S/S grossly wnl x4

## 2024-01-27 NOTE — Assessment & Plan Note (Signed)
 Discussed options.  PM sugar elevations.  Try taking metformin  2 tabs at night.  Let me know if not tolerated.  Then try increasing ozempic  to 2mg  when out of 1mg  dose.  Let me and Talbot Factor know how that is doing, in terms of tolerance and effect.  We'll go from there. He agrees.

## 2024-01-28 ENCOUNTER — Telehealth: Payer: Self-pay

## 2024-02-01 ENCOUNTER — Telehealth: Payer: Self-pay | Admitting: *Deleted

## 2024-02-01 ENCOUNTER — Encounter: Payer: Self-pay | Admitting: *Deleted

## 2024-02-02 ENCOUNTER — Other Ambulatory Visit: Payer: Self-pay | Admitting: Family Medicine

## 2024-02-03 ENCOUNTER — Ambulatory Visit (INDEPENDENT_AMBULATORY_CARE_PROVIDER_SITE_OTHER): Payer: Self-pay | Admitting: Neurology

## 2024-02-03 ENCOUNTER — Other Ambulatory Visit (HOSPITAL_COMMUNITY): Payer: Self-pay | Admitting: Pharmacist

## 2024-02-03 ENCOUNTER — Encounter: Payer: Self-pay | Admitting: Neurology

## 2024-02-03 ENCOUNTER — Telehealth: Payer: Self-pay

## 2024-02-03 VITALS — BP 120/58 | HR 66 | Ht 70.0 in | Wt 208.2 lb

## 2024-02-03 DIAGNOSIS — M4726 Other spondylosis with radiculopathy, lumbar region: Secondary | ICD-10-CM | POA: Insufficient documentation

## 2024-02-03 DIAGNOSIS — M25511 Pain in right shoulder: Secondary | ICD-10-CM | POA: Insufficient documentation

## 2024-02-03 DIAGNOSIS — M47812 Spondylosis without myelopathy or radiculopathy, cervical region: Secondary | ICD-10-CM | POA: Insufficient documentation

## 2024-02-03 DIAGNOSIS — G4733 Obstructive sleep apnea (adult) (pediatric): Secondary | ICD-10-CM | POA: Diagnosis not present

## 2024-02-03 DIAGNOSIS — Q2112 Patent foramen ovale: Secondary | ICD-10-CM

## 2024-02-03 DIAGNOSIS — I639 Cerebral infarction, unspecified: Secondary | ICD-10-CM

## 2024-02-03 DIAGNOSIS — S46009A Unspecified injury of muscle(s) and tendon(s) of the rotator cuff of unspecified shoulder, initial encounter: Secondary | ICD-10-CM | POA: Insufficient documentation

## 2024-02-03 MED ORDER — STUDY - LIBREXIA-STROKE - MILVEXIAN 25 MG OR PLACEBO TABLET (PI-SETHI): 1.0000 | ORAL_TABLET | Freq: Two times a day (BID) | ORAL | 0 refills | Status: DC

## 2024-02-03 MED ORDER — CLOPIDOGREL BISULFATE 75 MG PO TABS: 75.0000 mg | ORAL_TABLET | Freq: Every day | ORAL | 5 refills | Status: DC

## 2024-02-03 NOTE — Progress Notes (Addendum)
 [facility] [address] [place]. [place] [address] [phone]       [place] Stroke Study Note  Tony Manning Date of Birth:  1961-01-25 Medical Record Number:  [id number]   Referring MD: Consuelo Denmark  Reason for Referral: Stroke  HPI: Tony Manning is a 63 year old pleasant Caucasian male seen today for initial office research visit for stroke.  History is obtained from the patient and review of electronic medical records and I personally viewed pertinent available imaging films in PACS.  He has past medical history of coronary artery disease status post stenting x 5, diabetes, hypertension, hyperlipidemia, heart murmur, depression, chronic kidney disease, sleep apnea on CPAP, sarcoidosis, migraines, prior cervical discectomy with fusion.  He presented on 01/04/2024 as a code stroke via EMS to the emergency room with sudden onset of dizziness, left-sided weakness and chest pain.  NIH stroke scale of 6 on admission.  Patient met criteria for and was given IV TNK after discussion of about risk benefits and alternatives and did well.  Symptoms resolved shortly thereafter.  MRI scan showed acute infarct in left cerebellum as well as right occipital pole.  CT angiograms of head and neck showed streak artifact at the left thoracic and left but strong evidence of vertebral artery occlusion at the origin with reconstitution at the proximal V2 segment.  MRI confirmed patchy acute infarcts in left cerebellum and the superior cerebellar artery territory as well as several punctate acute areas of ischemia elsewhere in the left cerebellum and the right occipital pole.  2D echo showed ejection fraction of 60 to 65% with grade 1 diastolic dysfunction.  Lower extremity venous Dopplers were negative for DVT.  Transesophageal echo showed atrial septal aneurysm with a small right-to-left shunt.  LDL cholesterol 41 mg percent and hemoglobin A1c 8.4.  Urine drug screen was negative.  Patient was  started on aspirin  and Plavix  for 3 weeks and then Plavix  alone.  Patient was also enrolled in the [place] Stroke trial ( standard of care antiplatets w/wo Milvexian- a new Factor 11 inhibitor )  after he signed informed consent and met inclusion exclusion criteria.  Patient did well and neurological symptoms resolved shortly after TNK administration.  He was discharged home and did not need any outpatient therapies.  He is tolerating aspirin  and Plavix  well with minor bruising and no bleeding.  He states his sugars are also under good control.  He is tolerating Lipitor  well without muscle aches and pains.  She does feel slight left-sided weakness and dizziness when he is tired exhausted all times to walk too fast.  He has not had any recurrent stroke or TIA symptoms.  He does have chronic back pain and hip pain for which he has had some injections at the pain clinic and seems to be bothered by this at this time.  He does have appointment coming up with his primary care physician to discuss these symptoms.  ROS:   14 system review of systems is positive for hip pain, back pain, bruising all other systems negative  PMH:  Past Medical History:  Diagnosis Date   Anginal pain (HCC)    Basal cell carcinoma of face    Chest pain, unspecified    Chronic kidney disease    kidney stones   Coronary atherosclerosis of native coronary artery    s/p stent x5   Depression    Diabetes mellitus without mention of complication    Diverticulosis    Dyspnea  Dysrhythmia    Erectile dysfunction    Essential hypertension, benign    Family history of adverse reaction to anesthesia    GERD (gastroesophageal reflux disease)    Heart murmur    History of kidney stones    History of nephrolithiasis    Hyperlipidemia    Migraine    Myocardial infarction (HCC)    2016   Neuromuscular disorder (HCC)    Osteoarthritis    Pneumonia    Postsurgical percutaneous transluminal coronary angioplasty status    s/p  stent x4   Sarcoidosis    Sleep apnea    uses CPAP   Unspecified sleep apnea    uses C-pap    Social History:  Social History   Socioeconomic History   Marital status: Married    Spouse name: Not on file   Number of children: 2   Years of education: Not on file   Highest education level: Not on file  Occupational History   Occupation: disabled    Employer: unemployed  Tobacco Use   Smoking status: Never   Smokeless tobacco: Former    Types: Chew    Quit date: 02/06/2016  Vaping Use   Vaping status: Never Used  Substance and Sexual Activity   Alcohol use: No    Alcohol/week: 0.0 standard drinks of alcohol   Drug use: No   Sexual activity: Yes    Birth control/protection: Condom  Other Topics Concern   Not on file  Social History Narrative   Married, [phone] kids   On disability   Social Drivers of Health   Financial Resource Strain: Low Risk  (01/19/2024)   Overall Financial Resource Strain (CARDIA)    Difficulty of Paying Living Expenses: Not hard at all  Food Insecurity: No Food Insecurity (01/19/2024)   Hunger Vital Sign    Worried About Running Out of Food in the Last Year: Never true    Ran Out of Food in the Last Year: Never true  Transportation Needs: No Transportation Needs (01/19/2024)   PRAPARE - Administrator, Civil Service (Medical): No    Lack of Transportation (Non-Medical): No  Physical Activity: Insufficiently Active (01/19/2024)   Exercise Vital Sign    Days of Exercise per Week: 5 days    Minutes of Exercise per Session: 20 min  Stress: No Stress Concern Present (01/19/2024)   [employer] of Occupational Health - Occupational Stress Questionnaire    Feeling of Stress : Not at all  Social Connections: Moderately Isolated (01/19/2024)   Social Connection and Isolation Panel [NHANES]    Frequency of Communication with Friends and Family: Three times a week    Frequency of Social Gatherings with Friends and Family: More than three  times a week    Attends Religious Services: Never    Database administrator or Organizations: No    Attends Banker Meetings: Never    Marital Status: Married  Catering manager Violence: Not At Risk (01/19/2024)   Humiliation, Afraid, Rape, and Kick questionnaire    Fear of Current or Ex-Partner: No    Emotionally Abused: No    Physically Abused: No    Sexually Abused: No    Medications:   Current Outpatient Medications on File Prior to Visit  Medication Sig Dispense Refill   amLODipine  (NORVASC ) 5 MG tablet Take 1 tablet (5 mg total) by mouth daily. 90 tablet 1   atorvastatin  (LIPITOR ) 40 MG tablet TAKE 1 TABLET BY MOUTH EVERY MORNING  30 tablet 10   carvedilol  (COREG ) 3.125 MG tablet Take 1 tablet (3.125 mg total) by mouth 2 (two) times daily. 180 tablet 3   clopidogrel  (PLAVIX ) 75 MG tablet Take 1 tablet (75 mg total) by mouth daily. 30 tablet 1   Continuous Glucose Sensor (FREESTYLE LIBRE 3 PLUS SENSOR) MISC USE TO CHECK BLOOD SUGAR CONTINUOUSLY. CHANGE SENSOR EVERY 15 DAYS. 6 each 1   gabapentin  (NEURONTIN ) 300 MG capsule TAKE 1-2 CAPSULES BY MOUTH IN THE EVENING. MAY CAUSE SEDATION 180 capsule 1   insulin  degludec (TRESIBA  FLEXTOUCH) 100 UNIT/ML FlexTouch Pen INJECT 40 UNITS SUBCUTANEOUSLY DAILY. Day supply 30. (Patient taking differently: Inject 48 Units into the skin daily. INJECT 48 UNITS SUBCUTANEOUSLY DAILY. Day supply 30.)     losartan  (COZAAR ) 50 MG tablet Take 1 tablet (50 mg total) by mouth daily. 90 tablet 3   metFORMIN  (GLUCOPHAGE -XR) 500 MG 24 hr tablet Take 2 tablets (1,000 mg total) by mouth at bedtime.     nitroGLYCERIN  (NITROSTAT ) 0.4 MG SL tablet Place 1 tablet (0.4 mg total) under the tongue every 5 (five) minutes as needed for chest pain. 25 tablet 3   ondansetron  (ZOFRAN ) 4 MG tablet Take 4 mg by mouth daily as needed for nausea or vomiting.     oxyCODONE -acetaminophen  (PERCOCET/ROXICET) 5-325 MG tablet Take 0.5-1 tablets by mouth every 6 (six) hours as  needed for severe pain.     pantoprazole  (PROTONIX ) 40 MG tablet TAKE 1 TABLET BY MOUTH EVERY MORNING 30 tablet 10   Semaglutide , 1 MG/DOSE, (OZEMPIC , 1 MG/DOSE,) 4 MG/3ML SOPN INJECT 1 MG AS DIRECTED ONCE A WEEK. 3 mL 1   Study - LIBREXIA-STROKE - milvexian 25 mg or placebo tablet (PI-Jenaya Saar) Take 1 tablet by mouth 2 (two) times daily. Contact [facility] for any questions or concerns regarding this medication. 70 tablet 0   tamsulosin  (FLOMAX ) 0.4 MG CAPS capsule Take 2 capsules (0.8 mg total) by mouth daily. 180 capsule 3   cyclobenzaprine  (FLEXERIL ) 5 MG tablet TAKE 1 TABLET BY MOUTH EVERYDAY AT BEDTIME (Patient not taking: Reported on 02/03/2024) 30 tablet 1   No current facility-administered medications on file prior to visit.    Allergies:   Allergies  Allergen Reactions   Ambien [Zolpidem] Other (See Comments)    Parasomnias, sleep walking   Lisinopril Cough   Metformin  And Related Other (See Comments)    GI upset with immediate release form   Tramadol  Diarrhea    Physical Exam General: well developed, well nourished, seated, in no evident distress Head: head normocephalic and atraumatic.   Neck: supple with no carotid or supraclavicular bruits Cardiovascular: regular rate and rhythm, no murmurs Musculoskeletal: no deformity Skin:  no rash/petichiae Vascular:  Normal pulses all extremities  Neurologic Exam Mental Status: Awake and fully alert. Oriented to place and time. Recent and remote memory intact. Attention span, concentration and fund of knowledge appropriate. Mood and affect appropriate.  Cranial Nerves: Fundoscopic exam reveals sharp disc margins. Pupils equal, briskly reactive to light. Extraocular movements full without nystagmus. Visual fields full to confrontation. Hearing intact. Facial sensation intact. Face, tongue, palate moves normally and symmetrically.  Motor: Normal bulk and tone. Normal strength in all tested extremity  muscles. Sensory.: intact to touch , pinprick , position and vibratory sensation.  Coordination: Rapid alternating movements normal in all extremities. Finger-to-nose and heel-to-shin performed accurately bilaterally. Gait and Station: Arises from chair without difficulty. Stance is normal. Gait demonstrates favoring of the right hip due to  pain.. Able to heel, toe and tandem walk without difficulty.  Reflexes: 1+ and symmetric. Toes downgoing.   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 I also advised the patient to eat a healthy diet with plenty of whole grains, cereals, fruits and vegetables, exercise regularly and maintain ideal body weight.  Advised him also to be compliant with using his CPAP for his sleep apnea. Check TCD Bubble study to further categorize his PFO though his low ROPE score of 4 would make him an unlikely candidate for endovascular PFO closure I advised him to follow-up with his primary care physician for his hip and back pain pain .followup in the future with me as per [place] stroke trial protocol This was a no charge visit as part of the [place]  stroke research  study Ardella Beaver, MD Note: This document was prepared with digital dictation and possible smart phrase technology. Any transcriptional errors that result from this process are unintentional.

## 2024-02-03 NOTE — Patient Instructions (Signed)
 I had a long d/w patient about his recent cryptogenic strokes, risk for recurrent stroke/TIAs, personally independently reviewed imaging studies and stroke evaluation results and answered questions.Continue Plavix  75 g daily and discontinue aspirin  now and continue Librexia stroke trial medication ( Milvexian versus placebo )  for secondary stroke prevention and maintain strict control of hypertension with blood pressure goal below 130/90, diabetes with hemoglobin A1c goal below 6.5% and lipids with LDL cholesterol goal below 70 mg/dL. I also advised the patient to eat a healthy diet with plenty of whole grains, cereals, fruits and vegetables, exercise regularly and maintain ideal body weight.  I advised him to follow-up with his primary care physician for his hip and back pain pain .followup in the future with me as per [place] stroke trial protocol  Stroke Prevention Some medical conditions and behaviors can lead to a higher chance of having a stroke. You can help prevent a stroke by eating healthy, exercising, not smoking, and managing any medical conditions you have. Stroke is a leading cause of functional impairment. Primary prevention is particularly important because a majority of strokes are first-time events. Stroke changes the lives of not only those who experience a stroke but also their family and other caregivers. How can this condition affect me? A stroke is a medical emergency and should be treated right away. A stroke can lead to brain damage and can sometimes be life-threatening. If a person gets medical treatment right away, there is a better chance of surviving and recovering from a stroke. What can increase my risk? The following medical conditions may increase your risk of a stroke: Cardiovascular disease. High blood pressure (hypertension). Diabetes. High cholesterol. Sickle cell disease. Blood clotting disorders (hypercoagulable state). Obesity. Sleep disorders (obstructive  sleep apnea). Other risk factors include: Being older than age 87. Having a history of blood clots, stroke, or mini-stroke (transient ischemic attack, TIA). Genetic factors, such as race, ethnicity, or a family history of stroke. Smoking cigarettes or using other tobacco products. Taking birth control pills, especially if you also use tobacco. Heavy use of alcohol or drugs, especially cocaine and methamphetamine. Physical inactivity. What actions can I take to prevent this? Manage your health conditions High cholesterol levels. Eating a healthy diet is important for preventing high cholesterol. If cholesterol cannot be managed through diet alone, you may need to take medicines. Take any prescribed medicines to control your cholesterol as told by your health care provider. Hypertension. To reduce your risk of stroke, try to keep your blood pressure below 130/80. Eating a healthy diet and exercising regularly are important for controlling blood pressure. If these steps are not enough to manage your blood pressure, you may need to take medicines. Take any prescribed medicines to control hypertension as told by your health care provider. Ask your health care provider if you should monitor your blood pressure at home. Have your blood pressure checked every year, even if your blood pressure is normal. Blood pressure increases with age and some medical conditions. Diabetes. Eating a healthy diet and exercising regularly are important parts of managing your blood sugar (glucose). If your blood sugar cannot be managed through diet and exercise, you may need to take medicines. Take any prescribed medicines to control your diabetes as told by your health care provider. Get evaluated for obstructive sleep apnea. Talk to your health care provider about getting a sleep evaluation if you snore a lot or have excessive sleepiness. Make sure that any other medical conditions you have, such  as atrial  fibrillation or atherosclerosis, are managed. Nutrition Follow instructions from your health care provider about what to eat or drink to help manage your health condition. These instructions may include: Reducing your daily calorie intake. Limiting how much salt (sodium) you use to 1,500 milligrams (mg) each day. Using only healthy fats for cooking, such as olive oil, canola oil, or sunflower oil. Eating healthy foods. You can do this by: Choosing foods that are high in fiber, such as whole grains, and fresh fruits and vegetables. Eating at least 5 servings of fruits and vegetables a day. Try to fill one-half of your plate with fruits and vegetables at each meal. Choosing lean protein foods, such as lean cuts of meat, poultry without skin, fish, tofu, beans, and nuts. Eating low-fat dairy products. Avoiding foods that are high in sodium. This can help lower blood pressure. Avoiding foods that have saturated fat, trans fat, and cholesterol. This can help prevent high cholesterol. Avoiding processed and prepared foods. Counting your daily carbohydrate intake.  Lifestyle If you drink alcohol: Limit how much you have to: 0-1 drink a day for women who are not pregnant. 0-2 drinks a day for men. Know how much alcohol is in your drink. In the [place] one drink equals one 12 oz bottle of beer ( ), one 5 oz glass of wine ( ), or one 1 oz glass of hard liquor (44mL). Do not use any products that contain nicotine or tobacco. These products include cigarettes, chewing tobacco, and vaping devices, such as e-cigarettes. If you need help quitting, ask your health care provider. Avoid secondhand smoke. Do not use drugs. Activity  Try to stay at a healthy weight. Get at least 30 minutes of exercise on most days, such as: Fast walking. Biking. Swimming. Medicines Take over-the-counter and prescription medicines only as told by your health care provider. Aspirin  or blood thinners (antiplatelets  or anticoagulants) may be recommended to reduce your risk of forming blood clots that can lead to stroke. Avoid taking birth control pills. Talk to your health care provider about the risks of taking birth control pills if: You are over 63 years old. You smoke. You get very bad headaches. You have had a blood clot. Where to find more information American Stroke Association: www.strokeassociation.org Get help right away if: You or a loved one has any symptoms of a stroke. "BE FAST" is an easy way to remember the main warning signs of a stroke: B - Balance. Signs are dizziness, sudden trouble walking, or loss of balance. E - Eyes. Signs are trouble seeing or a sudden change in vision. F - Face. Signs are sudden weakness or numbness of the face, or the face or eyelid drooping on one side. A - Arms. Signs are weakness or numbness in an arm. This happens suddenly and usually on one side of the body. S - Speech. Signs are sudden trouble speaking, slurred speech, or trouble understanding what people say. T - Time. Time to call emergency services. Write down what time symptoms started. You or a loved one has other signs of a stroke, such as: A sudden, severe headache with no known cause. Nausea or vomiting. Seizure. These symptoms may represent a serious problem that is an emergency. Do not wait to see if the symptoms will go away. Get medical help right away. Call your local emergency services (911 in the [place] Do not drive yourself to the hospital. Summary You can help to prevent a stroke by eating healthy, exercising,  not smoking, limiting alcohol intake, and managing any medical conditions you may have. Do not use any products that contain nicotine or tobacco. These include cigarettes, chewing tobacco, and vaping devices, such as e-cigarettes. If you need help quitting, ask your health care provider. Remember "BE FAST" for warning signs of a stroke. Get help right away if you or a loved one has  any of these signs. This information is not intended to replace advice given to you by your health care provider. Make sure you discuss any questions you have with your health care provider. Document Revised: 07/27/2022 Document Reviewed: 07/27/2022 Elsevier Patient Education  2024 [employer].

## 2024-02-03 NOTE — Telephone Encounter (Signed)
 Left message to advise that he has 4 boxes of ozempic  available for pickup

## 2024-02-04 ENCOUNTER — Ambulatory Visit (INDEPENDENT_AMBULATORY_CARE_PROVIDER_SITE_OTHER): Admitting: Family Medicine

## 2024-02-04 ENCOUNTER — Encounter: Payer: Self-pay | Admitting: Family Medicine

## 2024-02-04 VITALS — BP 130/74 | HR 72 | Temp 98.0°F | Ht 70.0 in | Wt 206.0 lb

## 2024-02-04 DIAGNOSIS — E1149 Type 2 diabetes mellitus with other diabetic neurological complication: Secondary | ICD-10-CM

## 2024-02-04 DIAGNOSIS — R300 Dysuria: Secondary | ICD-10-CM | POA: Diagnosis not present

## 2024-02-04 DIAGNOSIS — G4730 Sleep apnea, unspecified: Secondary | ICD-10-CM

## 2024-02-04 DIAGNOSIS — Z794 Long term (current) use of insulin: Secondary | ICD-10-CM | POA: Diagnosis not present

## 2024-02-04 LAB — URINALYSIS, ROUTINE W REFLEX MICROSCOPIC
Bilirubin Urine: NEGATIVE
Ketones, ur: NEGATIVE
Leukocytes,Ua: NEGATIVE
Nitrite: NEGATIVE
Specific Gravity, Urine: 1.025 (ref 1.000–1.030)
Total Protein, Urine: NEGATIVE
Urine Glucose: NEGATIVE
Urobilinogen, UA: 0.2 (ref 0.0–1.0)
pH: 6 (ref 5.0–8.0)

## 2024-02-04 LAB — PSA, MEDICARE: PSA: 2.31 ng/mL (ref 0.10–4.00)

## 2024-02-04 MED ORDER — TRESIBA FLEXTOUCH 100 UNIT/ML ~~LOC~~ SOPN: 48.0000 [IU] | PEN_INJECTOR | Freq: Every day | SUBCUTANEOUS | Status: DC

## 2024-02-04 NOTE — Progress Notes (Signed)
 He recently talked to ortho about his back pain, had OV yesterday.    Taking 48 units of tresiba  and sugar was 120 this AM.  Still taking ozempic  1mg , d/w pt about continuing as is for now.  We can recheck his A1c later this year.  Taking 2 flomax  at baseline.  Getting up 2-3 times per day.  Urine stream stopping and starting, esp at night.  Last few nights have been better. Not burning with urination.  Unclear if he saw blood in urine recently.    Meds, vitals, and allergies reviewed.   ROS: Per HPI unless specifically indicated in ROS section   GEN: nad, alert and oriented HEENT: mucous membranes moist NECK: supple w/o LA CV: rrr. PULM: ctab, no inc wob ABD: soft, +bs EXT: no edema SKIN: Well-perfused.

## 2024-02-04 NOTE — Telephone Encounter (Signed)
 I ordered his CPAP settings to be set to 5-12 cmH2O. For some reason, the DME company set him on a set pressure of 8. I've changed his settings in his account but went ahead and moved the top pressure down; now 5-10 cmH2O. Can you also send an order to ensure his tube temperature is set to 78 degrees? Thanks.

## 2024-02-04 NOTE — Telephone Encounter (Signed)
**Note De-identified  [place] Obfuscation** Please advise 

## 2024-02-04 NOTE — Patient Instructions (Addendum)
 Go to the lab on the way out.   If you have mychart we'll likely use that to update you.    Take care.  Glad to see you. Keep taking flomax in the meantime.

## 2024-02-05 DIAGNOSIS — R300 Dysuria: Secondary | ICD-10-CM | POA: Insufficient documentation

## 2024-02-05 LAB — URINE CULTURE
MICRO NUMBER:: 16519969
Result:: NO GROWTH
SPECIMEN QUALITY:: ADEQUATE

## 2024-02-05 NOTE — Assessment & Plan Note (Signed)
 Taking 48 units of tresiba  and sugar was 120 this AM.  Still taking ozempic  1mg , d/w pt about continuing as is for now.  We can recheck his A1c later this year.  Sugar has been reasonably well-controlled recently.

## 2024-02-05 NOTE — Assessment & Plan Note (Signed)
 See notes on labs.  Check urinalysis and urine culture.  Continue 2 Flomax  capsules per day in the meantime.  Okay for outpatient follow-up.  Fortunately the last few nights have been better for the patient.

## 2024-02-06 ENCOUNTER — Ambulatory Visit: Payer: Self-pay | Admitting: Family Medicine

## 2024-02-07 ENCOUNTER — Ambulatory Visit (INDEPENDENT_AMBULATORY_CARE_PROVIDER_SITE_OTHER)

## 2024-02-07 ENCOUNTER — Ambulatory Visit: Payer: Self-pay | Admitting: Cardiology

## 2024-02-07 DIAGNOSIS — I63442 Cerebral infarction due to embolism of left cerebellar artery: Secondary | ICD-10-CM | POA: Diagnosis not present

## 2024-02-07 LAB — CUP PACEART REMOTE DEVICE CHECK
Date Time Interrogation Session: 20250602063759
Implantable Pulse Generator Implant Date: 20250501

## 2024-02-08 ENCOUNTER — Telehealth: Payer: Self-pay | Admitting: Nurse Practitioner

## 2024-02-08 ENCOUNTER — Telehealth: Payer: Self-pay

## 2024-02-08 NOTE — Telephone Encounter (Signed)
 Cmn received from [employer] for CPAP equipment.

## 2024-02-08 NOTE — Progress Notes (Signed)
 Complex Care Management Care Guide Note  02/08/2024 Name: Tony Manning MRN: [id number] DOB: 1960-12-10  Tony Manning is a 63 y.o. year old male who is a primary care patient of Donnie Galea, MD and is actively engaged with the care management team. I reached out to [name] by phone today to assist with re-scheduling  with the RN Case Manager.  Follow up plan: Telephone appointment with complex care management team member scheduled for:  03/01/24 at 9:00 a.m.   [facility]  [facility], [facility] Care Management Assistant Direct Dial: [phone]  Fax: [phone]

## 2024-02-14 NOTE — Telephone Encounter (Signed)
 CMN signed and faxed successfully

## 2024-02-14 NOTE — Telephone Encounter (Signed)
 CMN duplicate received

## 2024-02-14 NOTE — Telephone Encounter (Signed)
 CMN signed and faxed successfully and sent to scan.

## 2024-02-16 ENCOUNTER — Encounter (HOSPITAL_COMMUNITY): Payer: Self-pay

## 2024-02-16 ENCOUNTER — Telehealth (HOSPITAL_COMMUNITY): Payer: Self-pay | Admitting: *Deleted

## 2024-02-16 NOTE — Telephone Encounter (Signed)
 Reaching out to patient to offer assistance regarding upcoming cardiac imaging study; patient reports he was unaware of appt and request to reschedule.  Kerri Peed RN Navigator Cardiac Imaging [facility] [phone] office [phone] cell

## 2024-02-17 ENCOUNTER — Ambulatory Visit

## 2024-02-20 ENCOUNTER — Other Ambulatory Visit: Payer: Self-pay | Admitting: Family Medicine

## 2024-03-01 ENCOUNTER — Encounter: Payer: Self-pay | Admitting: *Deleted

## 2024-03-01 ENCOUNTER — Other Ambulatory Visit: Payer: Self-pay | Admitting: *Deleted

## 2024-03-01 ENCOUNTER — Other Ambulatory Visit: Payer: Self-pay

## 2024-03-01 ENCOUNTER — Other Ambulatory Visit (HOSPITAL_COMMUNITY): Payer: Self-pay | Admitting: Neurology

## 2024-03-01 MED ORDER — STUDY - LIBREXIA-STROKE - MILVEXIAN 25 MG OR PLACEBO TABLET (PI-SETHI): 1.0000 | ORAL_TABLET | Freq: Two times a day (BID) | ORAL | 0 refills | Status: DC

## 2024-03-02 ENCOUNTER — Telehealth: Payer: Self-pay

## 2024-03-02 NOTE — Telephone Encounter (Signed)
 Spoke with patient to advise that he has 4 boxes of the Tresiba  available for pickup

## 2024-03-05 DIAGNOSIS — G4733 Obstructive sleep apnea (adult) (pediatric): Secondary | ICD-10-CM | POA: Diagnosis not present

## 2024-03-06 ENCOUNTER — Telehealth: Payer: Self-pay | Admitting: Neurology

## 2024-03-06 ENCOUNTER — Ambulatory Visit: Payer: Self-pay | Admitting: *Deleted

## 2024-03-06 NOTE — Telephone Encounter (Signed)
 FYI Only or Action Required?: FYI only for provider.  Patient was last seen in primary care on 02/04/2024 by Cleatus Arlyss RAMAN, MD. Called Nurse Triage reporting Dizziness. Symptoms began several days ago. Interventions attempted: Rest, hydration, or home remedies. Symptoms are: unchanged.  Triage Disposition: See Physician Within 24 Hours  Patient/caregiver understands and will follow disposition?: yes Reason for Disposition . [1] MODERATE dizziness (e.g., interferes with normal activities) AND [2] has NOT been evaluated by doctor (or NP/PA) for this  (Exception: Dizziness caused by heat exposure, sudden standing, or poor fluid intake.)  Answer Assessment - Initial Assessment Questions 1. DESCRIPTION: Describe your dizziness.     Floating feeling, standing and moving around can cause the dizziness 2. LIGHTHEADED: Do you feel lightheaded? (e.g., somewhat faint, woozy, weak upon standing)     Weakness once- but no faint sensation 3. VERTIGO: Do you feel like either you or the room is spinning or tilting? (i.e. vertigo)     Patient feels spinning- not room Patient did reach out to neuro- but advised to call PCP 4. SEVERITY: How bad is it?  Do you feel like you are going to faint? Can you stand and walk?   - MILD: Feels slightly dizzy, but walking normally.   - MODERATE: Feels unsteady when walking, but not falling; interferes with normal activities (e.g., school, work).   - SEVERE: Unable to walk without falling, or requires assistance to walk without falling; feels like passing out now.      Mild- patient states comes and goes- no dizzy now 5. ONSET:  When did the dizziness begin?     Saturday night 6. AGGRAVATING FACTORS: Does anything make it worse? (e.g., standing, change in head position)     Changing position-mostly occurring when up and moving- work has been difficult- balancing on step stool 7. HEART RATE: Can you tell me your heart rate? How many beats in 15  seconds?  (Note: not all patients can do this)       145/70, 74 8. CAUSE: What do you think is causing the dizziness?     unsure 9. RECURRENT SYMPTOM: Have you had dizziness before? If Yes, ask: When was the last time? What happened that time?     Yes- that was stroke related, some dizziness at work causing balance to be difficult 10. OTHER SYMPTOMS: Do you have any other symptoms? (e.g., fever, chest pain, vomiting, diarrhea, bleeding)       Slight headache  Protocols used: Dizziness - Lightheadedness-A-AH    Copied from CRM [id number]. Topic: Clinical - Red Word Triage >> Mar 06, 2024 10:59 AM Mesmerise C wrote: [facility] that prompted transfer to Nurse Triage: Patient is experiencing bad dizzy spells spoke to Neurologist stated they don't believe it's related to the stroke he had last year, also been experiencing headaches as well dizzy spells started Saturday night and the headaches started last night

## 2024-03-06 NOTE — Telephone Encounter (Signed)
 Pt called stating that he was informed to call the office if he were to start having dizzy spells again. Pt states that he started having them about Sat night and now it is involving headaches as well. Please advise.

## 2024-03-06 NOTE — Telephone Encounter (Signed)
 I returned the patient's call to my office.  He stated that Saturday he spent a lot of time outdoors he was fishing.  Since then he complained of increased dizziness as well as headaches.  He states he drank enough fluids but maybe not enough.  Seem to be feeling somewhat better today.  Advised the patient to maintain adequate hydration and drink at least 8 to 10 glasses of liquids a day.  I advised him to limit his time outdoors in this hot weather and to cover his head and eyes and drink lots of fluids.  He was advised to continue Plavix  and Librexia stroke study medication and he voiced understanding

## 2024-03-06 NOTE — Telephone Encounter (Signed)
FYI for appt tomorrow

## 2024-03-06 NOTE — Telephone Encounter (Signed)
 Spoke w/Pt regarding dizziness. Pt stated it started Sat night after he was in bed, stated it was intermittent during the day Sunday and he developed a HA last night. Pt is currently in [place] Stroke trial. Encouraged Pt to call them as well. Also recommended if dizziness continues or worsens he should go to ED for evaluation. Pt voiced understanding and thanks for the call back.

## 2024-03-06 NOTE — Telephone Encounter (Signed)
 Noted. Thanks.

## 2024-03-07 ENCOUNTER — Ambulatory Visit (INDEPENDENT_AMBULATORY_CARE_PROVIDER_SITE_OTHER): Admitting: Family Medicine

## 2024-03-07 ENCOUNTER — Encounter (HOSPITAL_COMMUNITY): Payer: Self-pay | Admitting: Cardiology

## 2024-03-07 ENCOUNTER — Encounter: Payer: Self-pay | Admitting: Family Medicine

## 2024-03-07 VITALS — BP 118/68 | HR 68 | Temp 98.1°F | Ht 70.0 in | Wt 207.0 lb

## 2024-03-07 DIAGNOSIS — Z794 Long term (current) use of insulin: Secondary | ICD-10-CM | POA: Diagnosis not present

## 2024-03-07 DIAGNOSIS — I951 Orthostatic hypotension: Secondary | ICD-10-CM | POA: Diagnosis not present

## 2024-03-07 DIAGNOSIS — E1149 Type 2 diabetes mellitus with other diabetic neurological complication: Secondary | ICD-10-CM | POA: Diagnosis not present

## 2024-03-07 MED ORDER — TRESIBA FLEXTOUCH 100 UNIT/ML ~~LOC~~ SOPN: 46.0000 [IU] | PEN_INJECTOR | Freq: Every day | SUBCUTANEOUS | Status: DC

## 2024-03-07 NOTE — Telephone Encounter (Signed)
 Patient was in office today and picked up medication

## 2024-03-07 NOTE — Patient Instructions (Addendum)
 Drink enough water to keep your urine clear or light colored.   Take care.  Glad to see you. Update me as needed.

## 2024-03-07 NOTE — Progress Notes (Unsigned)
 He went fishing for his birthday at his uncle's house.  He had a good day. It was a hot day.    He had to lower his insulin  to 46 units with recent lower sugars.    Saturday night with sx.  When laying down, likely I was floating. Similar feeling the next day.  The room wasn't spinning but I felt like I was spinning.    He felt a little lightheaded this AM.    Lightheaded vs dizzy feels different to patient.  He can feel slightly orthostatic with sudden standing.  No syncope.    This doesn't feel like prev CVA.    He had concentrated yellow urine recently, per patient report.   D/w pt about bed side exercise for vertigo.  He had used that in the past with relief but not recently.    Overall he feels better today.   Sugar average 145 over the last 30 days.    Mildly lightheaded on standing at the OV.  Not tachy.  He is going to see podiatry about L foot with dropped MT head and dorsiflexion at the MCP.  Unclear if he'll need/be a candidate for surgery but we can see about preop eval after getting update from podiatry.  No chest pain.   Meds, vitals, and allergies reviewed.   ROS: Per HPI unless specifically indicated in ROS section

## 2024-03-08 NOTE — Assessment & Plan Note (Signed)
 Mildly lightheaded.  He had concentrated yellow urine recently, per patient report.  Discussed increasing fluid intake, staying well-hydrated especially when in the heat.  Heat cautions discussed with patient.  He could have a separate issue with vertigo.  D/w pt about bed side exercise for vertigo.  He had used that in the past with relief but not recently.  He can retry that at home and update me as needed.

## 2024-03-09 ENCOUNTER — Ambulatory Visit

## 2024-03-09 DIAGNOSIS — I63442 Cerebral infarction due to embolism of left cerebellar artery: Secondary | ICD-10-CM

## 2024-03-09 LAB — CUP PACEART REMOTE DEVICE CHECK
Date Time Interrogation Session: 20250703062128
Implantable Pulse Generator Implant Date: 20250501

## 2024-03-13 ENCOUNTER — Ambulatory Visit: Payer: Self-pay | Admitting: Cardiology

## 2024-03-13 ENCOUNTER — Ambulatory Visit: Admitting: Podiatry

## 2024-03-13 ENCOUNTER — Encounter: Payer: Self-pay | Admitting: Podiatry

## 2024-03-13 VITALS — Ht 70.0 in | Wt 207.0 lb

## 2024-03-13 DIAGNOSIS — M7742 Metatarsalgia, left foot: Secondary | ICD-10-CM | POA: Diagnosis not present

## 2024-03-13 DIAGNOSIS — M2042 Other hammer toe(s) (acquired), left foot: Secondary | ICD-10-CM | POA: Diagnosis not present

## 2024-03-13 DIAGNOSIS — M7752 Other enthesopathy of left foot: Secondary | ICD-10-CM

## 2024-03-13 NOTE — Progress Notes (Signed)
  Subjective:  Patient ID: Tony Manning, male    DOB: Oct 16, 1960,  MRN: [id number]  Chief Complaint  Patient presents with   Foot Pain    Pt is here due to left second toe pain, he states that the pain has not gotten any better and toe has been inflammed, tried to schedule MRI several times with no luck    63 y.o. male presents with the above complaint. History confirmed with patient.  Thought not had much improvement.  He tried to get the MRI scheduled twice they said they would call him back and never returned his call.  He would like to go to the [place] location instead.  Today he is doing pretty well but the pain around the toe is still quite bothersome.  Objective:  Physical Exam: warm, good capillary refill, no trophic changes or ulcerative lesions, normal DP and PT pulses, normal sensory exam, and pes cavus foot type he has semirigid hammertoe deformities that are difficult to reduce, no pain to palpation today   Previous left foot radiographs shows pes cavus foot type with hammertoe contractures no bony acute osseous abnormality at that time these were dated December 2024   Assessment:   1. Bursitis of intermetatarsal bursa of left foot   2. Metatarsalgia, left foot   3. Hammertoe of left foot       Plan:  Patient was evaluated and treated and all questions answered.   Overall still dealing with metatarsalgia and pain with hammertoe deformities.  There are sometimes some symptoms consistent with neuroma or bursitis.  I do think the MRI still would be helpful in helping us  decide on further treatment options.  If the MRI is unrevealing for this then we will consider hammertoe correction and metatarsal osteotomies, I discussed with him that his A1c would need to be below 7.5% before proceeding with this.  He did have a mild stroke in April but has no residual motor dysfunction on the side from this he is on Plavix  now for this.  Stroke cause was unknown and he is still  following with neurology.  We discussed the risk of this in regards to surgery as well.  MRI has been reordered and will see if we can help to facilitate scheduling for this.   Return for after MRI to review.

## 2024-03-14 ENCOUNTER — Telehealth

## 2024-03-14 ENCOUNTER — Ambulatory Visit: Payer: Medicare HMO | Admitting: Cardiology

## 2024-03-14 ENCOUNTER — Telehealth: Payer: Self-pay | Admitting: Neurology

## 2024-03-14 NOTE — Telephone Encounter (Signed)
 Patient left me a vm with is insurance company on the line asking for the codes for this visit on Mar 31, 2024 so they can tell him what the cost would be.

## 2024-03-16 ENCOUNTER — Ambulatory Visit: Admitting: Cardiology

## 2024-03-17 ENCOUNTER — Inpatient Hospital Stay: Admission: RE | Admit: 2024-03-17 | Source: Ambulatory Visit

## 2024-03-23 ENCOUNTER — Ambulatory Visit (HOSPITAL_COMMUNITY)
Admission: RE | Admit: 2024-03-23 | Discharge: 2024-03-23 | Disposition: A | Discharge status: Home / Self Care | Source: Ambulatory Visit | Attending: Neurology | Admitting: Neurology

## 2024-03-23 DIAGNOSIS — I639 Cerebral infarction, unspecified: Secondary | ICD-10-CM | POA: Insufficient documentation

## 2024-03-23 DIAGNOSIS — Q2112 Patent foramen ovale: Secondary | ICD-10-CM | POA: Diagnosis not present

## 2024-03-23 NOTE — Progress Notes (Signed)
 TCD with bubbles has been completed. Preliminary results can be found in CV Proc through chart review.   03/23/24 2:24 PM Cathlyn Collet RVT

## 2024-03-24 ENCOUNTER — Ambulatory Visit: Payer: Self-pay | Admitting: Neurology

## 2024-03-27 NOTE — Progress Notes (Signed)
 Carelink Summary Report / Loop Recorder

## 2024-03-29 ENCOUNTER — Telehealth: Payer: Self-pay

## 2024-03-29 ENCOUNTER — Ambulatory Visit: Admitting: Nurse Practitioner

## 2024-03-29 NOTE — Patient Instructions (Signed)
 Lonni CHRISTELLA Salmon - I am sorry I was unable to reach you today for our scheduled appointment. I work with Cleatus Arlyss RAMAN, MD and am calling to support your healthcare needs. Please contact [phone] to reschedule at your earliest convenience. I look forward to speaking with you soon.   Thank you,  Nestora Duos, MSN, RN [facility]  [facility], [facility] RN Care Manager Direct Dial: [phone] Fax: [phone]

## 2024-04-02 ENCOUNTER — Other Ambulatory Visit: Payer: Self-pay | Admitting: Family Medicine

## 2024-04-03 ENCOUNTER — Other Ambulatory Visit (HOSPITAL_COMMUNITY): Payer: Self-pay | Admitting: Pharmacist

## 2024-04-03 MED ORDER — STUDY - LIBREXIA-STROKE - MILVEXIAN 25 MG OR PLACEBO TABLET (PI-SETHI): 1.0000 | ORAL_TABLET | Freq: Two times a day (BID) | ORAL | 0 refills | Status: DC

## 2024-04-03 NOTE — Telephone Encounter (Signed)
 LOV: 03/07/24 NOV:04/25/2024 Last Refill: gabapentin  (NEURONTIN ) 300 MG capsule  07/20/2023 180 capsules 1 refill

## 2024-04-04 DIAGNOSIS — G4733 Obstructive sleep apnea (adult) (pediatric): Secondary | ICD-10-CM | POA: Diagnosis not present

## 2024-04-06 ENCOUNTER — Ambulatory Visit: Attending: Cardiology | Admitting: Cardiology

## 2024-04-06 ENCOUNTER — Encounter: Payer: Self-pay | Admitting: Cardiology

## 2024-04-06 VITALS — BP 112/66 | HR 68 | Resp 16 | Ht 70.0 in | Wt 196.6 lb

## 2024-04-06 DIAGNOSIS — I639 Cerebral infarction, unspecified: Secondary | ICD-10-CM

## 2024-04-06 DIAGNOSIS — I63442 Cerebral infarction due to embolism of left cerebellar artery: Secondary | ICD-10-CM

## 2024-04-06 DIAGNOSIS — I10 Essential (primary) hypertension: Secondary | ICD-10-CM

## 2024-04-06 DIAGNOSIS — G4733 Obstructive sleep apnea (adult) (pediatric): Secondary | ICD-10-CM

## 2024-04-06 DIAGNOSIS — I25118 Atherosclerotic heart disease of native coronary artery with other forms of angina pectoris: Secondary | ICD-10-CM | POA: Diagnosis not present

## 2024-04-06 NOTE — Progress Notes (Signed)
 Cardiology Office Note:  .   Date:  04/06/2024  ID:  ROCIO Manning, DOB [birth year], MRN [id number] PCP: Cleatus Arlyss RAMAN, MD  [facility] Providers Cardiologist:  Gordy Bergamo, MD   History of Present Illness: .   Tony Manning is a 63 y.o.  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    Patient presented to the emergency room on 01/04/2024 with left-sided weakness, MRI showing patchy acute infarcts in the left cerebellum SP TNK infusion.  TEE reported PFO (personally reviewed, no PFO), loop recorder was also loop recorder implanted. Fortunately he has had near complete neurologic recovery.  PFO was not felt to be the culprit per neurology.  Underwent TCD bubble study on 03/23/2024 revealing no intracardiac or extracardiac shunting, negative for PFO.  Discussed the use of AI scribe software for clinical note transcription with the patient, who gave verbal consent to proceed.  History of Present Illness Tony Manning is a 63 year old male with cryptogenic stroke who presents with episodes of dizziness and near blackout.  He experiences episodes of dizziness and near blackout, with a significant episode occurring yesterday afternoon. He is unsure if these episodes were recorded by his loop recorder, as no alerts were received. He also experiences nausea intermittently over the past few days.  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 A transcranial bubble study showed no patent foramen ovale. His loop recorder has not detected any atrial fibrillation.  He has coronary artery disease but reports no recurrent chest pain and has not used nitroglycerin . He takes atorvastatin  40 mg daily, with an LDL of 41. He is on Plavix  and losartan  50 mg daily  for blood pressure management. A PET stress test was scheduled for June but was not completed due to scheduling issues.  He manages diabetes with an A1c of 5.8 and uses a CPAP machine nightly.   Labs   Lab Results  Component Value Date   CHOL 109 01/05/2024   HDL 42 01/05/2024   LDLCALC 41 01/05/2024   TRIG 129 01/05/2024   CHOLHDL 2.6 01/05/2024   No results found for: LIPOA  Lab Results  Component Value Date   NA 140 01/06/2024   K 3.6 01/06/2024   CO2 23 01/06/2024   GLUCOSE 167 (H) 01/06/2024   BUN 23 01/06/2024   CREATININE 1.32 (H) 01/06/2024   CALCIUM  9.0 01/06/2024   GFR 59.11 (L) 06/11/2023   GFRNONAA >60 01/06/2024      Latest Ref Rng & Units 01/06/2024    6:26 AM 01/05/2024    9:35 AM 01/04/2024    9:02 AM  BMP  Glucose 70 - 99 mg/dL 832  709  761   BUN 8 - 23 mg/dL 23  25  31    Creatinine 0.61 - 1.24 mg/dL 8.67  8.61  8.59   Sodium 135 - 145 mmol/L 140  137  139   Potassium 3.5 - 5.1 mmol/L 3.6  3.4  3.5   Chloride 98 - 111 mmol/L 107  104  105   CO2 22 - 32 mmol/L 23  21    Calcium  8.9 - 10.3 mg/dL 9.0  8.9        Latest Ref Rng & Units 01/06/2024    6:26 AM 01/05/2024  9:35 AM 01/04/2024    9:02 AM  CBC  WBC 4.0 - 10.5 K/uL 8.8  11.8    Hemoglobin 13.0 - 17.0 g/dL 85.2  84.8  85.6   Hematocrit 39.0 - 52.0 % 42.5  44.0  42.0   Platelets 150 - 400 K/uL 188  205     Lab Results  Component Value Date   HGBA1C 8.4 (H) 11/12/2023    Lab Results  Component Value Date   TSH 1.34 01/27/2021     ROS  Review of Systems  Cardiovascular:  Negative for chest pain, dyspnea on exertion and leg swelling.   Physical Exam:   VS:  BP 112/66 (BP Location: Left Arm, Patient Position: Sitting, Cuff Size: Normal)   Pulse 68   Resp 16   Ht 5' 10 (1.778 m)   Wt 196 lb 9.6 oz (89.2 kg)   SpO2 100%   BMI 28.21 kg/m    Wt Readings from Last 3 Encounters:  04/06/24 196 lb 9.6 oz (89.2 kg)  03/13/24 207 lb (93.9 kg)  03/07/24 207 lb (93.9 kg)    Physical  Exam Neck:     Vascular: No carotid bruit or JVD.  Cardiovascular:     Rate and Rhythm: Normal rate and regular rhythm.     Heart sounds: Normal heart sounds. No murmur heard.    No gallop.  Pulmonary:     Effort: Pulmonary effort is normal.     Breath sounds: Normal breath sounds.  Abdominal:     General: Bowel sounds are normal.     Palpations: Abdomen is soft.  Musculoskeletal:     Right lower leg: No edema.     Left lower leg: No edema.    Studies Reviewed: .    03/23/2024 revealing no intracardiac or extracardiac shunting, negative for PFO.  Remote loop recorder transmission 03/09/2024: Normal sinus rhythm.  No atrial fibrillation. EKG:         Medications ordered    No orders of the defined types were placed in this encounter.    ASSESSMENT AND PLAN: .      ICD-10-CM   1. Cryptogenic stroke (HCC)  I63.9     2. 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 Primary hypertension  I10     4. OSA on CPAP  G47.33     5. Coronary artery disease of native artery of native heart with stable angina pectoris (HCC)  I25.118      Assessment & Plan Cryptogenic stroke, left cerebellar artery The stroke in the left cerebellar artery appears embolic, suggesting a cardiac origin, but remains cryptogenic due to an unknown exact cause. No patent foramen ovale was found via transcranial bubble study, and the loop recorder has not detected atrial fibrillation. - Continue loop recorder monitoring for arrhythmias. - Instruct to send a transmission if he feels like he is going to pass out, but not for every minor symptom. - Educate on the automatic alert system of the loop recorder for significant events like AFib or heart block.  Coronary artery disease, stable Coronary artery disease is well-managed with no recurrent chest pain, no nitroglycerin  use, and well-controlled cholesterol levels (LDL 41). - Continue current medications  including atorvastatin  40 mg once a day. - Cancel the PET stress test as he is not experiencing chest pain.  Orthostatic hypotension Episodes of dizziness and near syncope may be related to orthostatic hypotension, potentially due to  diabetes causing blood pressure drops upon standing. - Instruct to check blood pressure while lying down and then standing to assess for significant drops (more than 10-20 mmHg). - Continue current blood pressure medications.  Type 2 diabetes mellitus Diabetes management is improving with an A1c of 5.8. Potential orthostatic hypotension may be related to diabetes. - Continue monitoring blood pressure and diabetes management.  Obstructive sleep apnea, on CPAP Obstructive sleep apnea is managed with regular CPAP use, as evidenced by facial marks from the mask. He reports feeling better with CPAP use. - Continue regular use of CPAP.  Hyperlipidemia, well controlled Hyperlipidemia is well controlled with an LDL of 41, managed with atorvastatin . - Continue atorvastatin  40 mg once a day.   Signed,  Gordy Bergamo, MD, [facility] 04/06/2024, 8:25 AM [facility] [address] [place], [place] [address] Phone: [phone]. Fax:  [phone]

## 2024-04-06 NOTE — Patient Instructions (Signed)
 Medication Instructions:  Your physician recommends that you continue on your current medications as directed. Please refer to the Current Medication list given to you today.  *If you need a refill on your cardiac medications before your next appointment, please call your pharmacy*  Lab Work: none If you have labs (blood work) drawn today and your tests are completely normal, you will receive your results only by: MyChart Message (if you have MyChart) OR A paper copy in the mail If you have any lab test that is abnormal or we need to change your treatment, we will call you to review the results.  Testing/Procedures: none  Follow-Up: At [facility], you and your health needs are our priority.  As part of our continuing mission to provide you with exceptional heart care, our providers are all part of one team.  This team includes your primary Cardiologist (physician) and Advanced Practice Providers or APPs (Physician Assistants and Nurse Practitioners) who all work together to provide you with the care you need, when you need it.  Your next appointment:   12 month(s)  Provider:   Knox Perl, MD    We recommend signing up for the patient portal called "MyChart".  Sign up information is provided on this After Visit Summary.  MyChart is used to connect with patients for Virtual Visits (Telemedicine).  Patients are able to view lab/test results, encounter notes, upcoming appointments, etc.  Non-urgent messages can be sent to your provider as well.   To learn more about what you can do with MyChart, go to [email or web address].   Other Instructions

## 2024-04-10 ENCOUNTER — Ambulatory Visit (INDEPENDENT_AMBULATORY_CARE_PROVIDER_SITE_OTHER)

## 2024-04-10 ENCOUNTER — Ambulatory Visit
Admission: RE | Admit: 2024-04-10 | Discharge: 2024-04-10 | Disposition: A | Discharge status: Home / Self Care | Source: Ambulatory Visit | Attending: Podiatry | Admitting: Podiatry

## 2024-04-10 DIAGNOSIS — I639 Cerebral infarction, unspecified: Secondary | ICD-10-CM

## 2024-04-10 DIAGNOSIS — R600 Localized edema: Secondary | ICD-10-CM | POA: Diagnosis not present

## 2024-04-10 DIAGNOSIS — M7742 Metatarsalgia, left foot: Secondary | ICD-10-CM

## 2024-04-10 DIAGNOSIS — M7752 Other enthesopathy of left foot: Secondary | ICD-10-CM

## 2024-04-10 LAB — CUP PACEART REMOTE DEVICE CHECK
Date Time Interrogation Session: 20250803062529
Implantable Pulse Generator Implant Date: 20250501

## 2024-04-12 ENCOUNTER — Ambulatory Visit: Payer: Self-pay | Admitting: Cardiology

## 2024-04-14 ENCOUNTER — Other Ambulatory Visit: Payer: Self-pay | Admitting: Family Medicine

## 2024-04-18 ENCOUNTER — Telehealth: Payer: Self-pay

## 2024-04-18 NOTE — Patient Outreach (Signed)
 First telephone outreach attempt to obtain mRS. No answer. Left message for returned call.  [facility]  Population Health Care Management Assistant  Direct Dial: [phone]  Fax: [phone] Website: [email or web address]

## 2024-04-19 ENCOUNTER — Telehealth: Payer: Self-pay

## 2024-04-19 NOTE — Patient Outreach (Signed)
 Telephone outreach to patient to obtain mRS was successfully completed. MRS= 1  Tony Manning [facility] Health Care Management Assistant  Direct Dial: [phone]  Fax: [phone] Website: [email or web address]

## 2024-04-24 ENCOUNTER — Ambulatory Visit: Admitting: Nurse Practitioner

## 2024-04-25 ENCOUNTER — Encounter: Admitting: Family Medicine

## 2024-04-27 ENCOUNTER — Encounter: Payer: Self-pay | Admitting: Adult Health

## 2024-04-27 ENCOUNTER — Ambulatory Visit (INDEPENDENT_AMBULATORY_CARE_PROVIDER_SITE_OTHER): Admitting: Adult Health

## 2024-04-27 VITALS — BP 120/70 | Ht 70.0 in | Wt 198.2 lb

## 2024-04-27 DIAGNOSIS — Z87891 Personal history of nicotine dependence: Secondary | ICD-10-CM | POA: Diagnosis not present

## 2024-04-27 DIAGNOSIS — G4730 Sleep apnea, unspecified: Secondary | ICD-10-CM

## 2024-04-27 DIAGNOSIS — G4733 Obstructive sleep apnea (adult) (pediatric): Secondary | ICD-10-CM | POA: Diagnosis not present

## 2024-04-27 DIAGNOSIS — D869 Sarcoidosis, unspecified: Secondary | ICD-10-CM

## 2024-04-27 DIAGNOSIS — Z6828 Body mass index (BMI) 28.0-28.9, adult: Secondary | ICD-10-CM

## 2024-04-27 DIAGNOSIS — E669 Obesity, unspecified: Secondary | ICD-10-CM

## 2024-04-27 NOTE — Progress Notes (Signed)
 @Patient  ID: Tony Manning, male    DOB: 05-Feb-1961, 62 y.o.   MRN: [id number]  Chief Complaint  Patient presents with   Sleep Apnea   Sarcoidosis    Referring provider: Cleatus Arlyss RAMAN, MD  HPI: 63 year old male never smoker followed for obstructive sleep apnea Medical history significant for sarcoidosis confirmed by lung biopsy greater than 20 years ago, coronary artery disease, diabetes, chronic back pain and history of CVA  TEST/EVENTS :  Chest x-ray November 2021 lungs clear   2D echo May 2019 EF 55%   October 30, 2020 nuclear stress test normal  04/27/2024 Follow up ; OSA  Discussed the use of AI scribe software for clinical note transcription with the patient, who gave verbal consent to proceed.  History of Present Illness Tony Manning is a 63 year old male who presents for a follow-up visit regarding his CPAP machine.  Patient has underlying sleep apnea and recently received a new CPAP machine.  He has been using a new CPAP machine with a nasal mask and experiences dry mouth during the night, which he attributes to both the CPAP and his medications.  Currently using a nasal mask which he says is more comfortable.  Previously was unable to tolerate a fullface mask.  He does use a chinstrap . He uses the CPAP machine consistently for eight hours each night.  Feels that he benefits from CPAP.  CPAP download shows excellent compliance with 100% usage.  Daily average usage at 8 hours.  Patient is on auto CPAP 5 to 12 cm H2O.  Daily average pressure at 10.7 cm H2O.  AHI 0.5/hour  He has a history of sarcoidosis, treated with steroids over twenty years ago, and reports no current lung problems. He experienced a stroke in April, which resulted in temporary left-sided weakness and speech difficulties. These symptoms resolved within two hours after receiving treatment (TNK) in the hospital. He occasionally experiences mild left-sided weakness but notes improvement  over time. He is currently on Plavix .   No current cough or wheezing. He has never smoked.    Allergies  Allergen Reactions   Ambien [Zolpidem] Other (See Comments)    Parasomnias, sleep walking   Lisinopril Cough   Metformin  And Related Other (See Comments)    GI upset with immediate release form   Tramadol  Diarrhea    Immunization History  Administered Date(s) Administered   Fluad Quad(high Dose 65+) 06/12/2022   Influenza Inj Mdck Quad Pf 06/02/2018   Influenza Whole 09/17/2010   Influenza, Seasonal, Injecte, Preservative Fre 07/29/2023   Influenza,inj,Quad PF,6+ Mos 06/08/2013, 05/22/2014, 05/12/2016, 05/08/2019, 05/27/2020   Influenza-Unspecified 06/08/2013, 05/22/2014, 06/08/2015, 05/12/2016, 05/21/2017, 06/02/2018, 05/09/2019, 05/21/2021   PFIZER(Purple Top)SARS-COV-2 Vaccination 11/27/2019, 12/25/2019, 08/07/2020, 08/19/2020, 05/21/2021   Pfizer Covid-19 Vaccine Bivalent Booster 27yrs & up 05/21/2021   Pneumococcal Polysaccharide-23 09/08/2007, 02/16/2013   Td 09/07/2009   Td (Adult), 2 Lf Tetanus Toxid, Preservative Free 09/07/2009   Zoster Recombinant(Shingrix) 05/29/2022, 08/18/2022   Zoster, Live 12/16/2015    Past Medical History:  Diagnosis Date   Anginal pain (HCC)    Basal cell carcinoma of face    Chest pain, unspecified    Chronic kidney disease    kidney stones   Coronary atherosclerosis of native coronary artery    s/p stent x5   Depression    Diabetes mellitus without mention of complication    Diverticulosis    Dyspnea    Dysrhythmia    Erectile dysfunction  Essential hypertension, benign    Family history of adverse reaction to anesthesia    GERD (gastroesophageal reflux disease)    Heart murmur    History of kidney stones    History of nephrolithiasis    Hyperlipidemia    Migraine    Myocardial infarction (HCC)    2016   Neuromuscular disorder (HCC)    Osteoarthritis    Pneumonia    Postsurgical percutaneous transluminal coronary  angioplasty status    s/p stent x4   Sarcoidosis    Sleep apnea    uses CPAP   Unspecified sleep apnea    uses C-pap    Tobacco History: Social History   Tobacco Use  Smoking Status Never  Smokeless Tobacco Former   Types: Chew   Quit date: 02/06/2016   Counseling given: Not Answered   Outpatient Medications Prior to Visit  Medication Sig Dispense Refill   amLODipine  (NORVASC ) 5 MG tablet Take 1 tablet (5 mg total) by mouth daily. 90 tablet 1   atorvastatin  (LIPITOR ) 40 MG tablet TAKE 1 TABLET BY MOUTH EVERY MORNING 30 tablet 10   carvedilol  (COREG ) 3.125 MG tablet TAKE 1 TABLET BY MOUTH TWICE A DAY 180 tablet 2   clopidogrel  (PLAVIX ) 75 MG tablet Take 1 tablet (75 mg total) by mouth daily. 30 tablet 5   Continuous Glucose Sensor (FREESTYLE LIBRE 3 PLUS SENSOR) MISC USE TO CHECK BLOOD SUGAR CONTINUOUSLY. CHANGE SENSOR EVERY 15 DAYS. 6 each 1   cyclobenzaprine  (FLEXERIL ) 5 MG tablet TAKE 1 TABLET BY MOUTH EVERYDAY AT BEDTIME 30 tablet 1   gabapentin  (NEURONTIN ) 300 MG capsule TAKE 1-2 CAPSULES BY MOUTH IN THE EVENING. MAY CAUSE SEDATION 180 capsule 1   insulin  degludec (TRESIBA  FLEXTOUCH) 100 UNIT/ML FlexTouch Pen Inject 46-48 Units into the skin daily.     losartan  (COZAAR ) 50 MG tablet TAKE 1 TABLET BY MOUTH AT BEDTIME 90 tablet 2   metFORMIN  (GLUCOPHAGE -XR) 500 MG 24 hr tablet Take 2 tablets (1,000 mg total) by mouth at bedtime.     nitroGLYCERIN  (NITROSTAT ) 0.4 MG SL tablet Place 1 tablet (0.4 mg total) under the tongue every 5 (five) minutes as needed for chest pain. 25 tablet 3   ondansetron  (ZOFRAN ) 4 MG tablet Take 4 mg by mouth daily as needed for nausea or vomiting.     oxyCODONE -acetaminophen  (PERCOCET/ROXICET) 5-325 MG tablet Take 0.5-1 tablets by mouth every 6 (six) hours as needed for severe pain.     pantoprazole  (PROTONIX ) 40 MG tablet TAKE 1 TABLET BY MOUTH EVERY MORNING 60 tablet 0   Semaglutide , 1 MG/DOSE, (OZEMPIC , 1 MG/DOSE,) 4 MG/3ML SOPN INJECT 1 MG AS  DIRECTED ONCE A WEEK. 3 mL 1   Study - LIBREXIA-STROKE - milvexian 25 mg or placebo tablet (PI-Sethi) Take 1 tablet by mouth 2 (two) times daily. For Investigational Use Only. Bring all bottles back to next visit. Contact [facility] for questions about this medication. 280 tablet 0   tamsulosin  (FLOMAX ) 0.4 MG CAPS capsule Take 2 capsules (0.8 mg total) by mouth daily. 180 capsule 3   No facility-administered medications prior to visit.     Review of Systems:   Constitutional:   No  weight loss, night sweats,  Fevers, chills, fatigue, or  lassitude.  HEENT:   No headaches,  Difficulty swallowing,  Tooth/dental problems, or  Sore throat,                No sneezing, itching, ear ache, nasal congestion, post nasal drip,  CV:  No chest pain,  Orthopnea, PND, swelling in lower extremities, anasarca, dizziness, palpitations, syncope.   GI  No heartburn, indigestion, abdominal pain, nausea, vomiting, diarrhea, change in bowel habits, loss of appetite, bloody stools.   Resp: No shortness of breath with exertion or at rest.  No excess mucus, no productive cough,  No non-productive cough,  No coughing up of blood.  No change in color of mucus.  No wheezing.  No chest wall deformity  Skin: no rash or lesions.  GU: no dysuria, change in color of urine, no urgency or frequency.  No flank pain, no hematuria   MS:  No joint pain or swelling.  No decreased range of motion.  No back pain.    Physical Exam  BP 120/70   Ht 5' 10 (1.778 m)   Wt 198 lb 3.2 oz (89.9 kg)   SpO2 100% Comment: RA  BMI 28.44 kg/m   GEN: A/Ox3; pleasant , NAD, well nourished    HEENT:  [place]/AT,  NOSE-clear, THROAT-clear, no lesions, no postnasal drip or exudate noted.   NECK:  Supple w/ fair ROM; no JVD; normal carotid impulses w/o bruits; no thyromegaly or nodules palpated; no lymphadenopathy.    RESP  Clear  P & A; w/o, wheezes/ rales/ or rhonchi. no accessory muscle use, no dullness to  percussion  CARD:  RRR, no m/r/g, no peripheral edema, pulses intact, no cyanosis or clubbing.  GI:   Soft & nt; nml bowel sounds; no organomegaly or masses detected.   Musco: Warm bil, no deformities or joint swelling noted.   Neuro: alert, no focal deficits noted.    Skin: Warm, no lesions or rashes    Lab Results:    BMET  ProBNP  Administration History     None           No data to display          No results found for: NITRICOXIDE      Assessment & Plan:   No problem-specific Assessment & Plan notes found for this encounter.  Assessment and Plan Assessment & Plan Obstructive sleep apnea   Obstructive sleep apnea is well-controlled with CPAP.  Has perceived benefit  CPAP usage is excellent, usage Use saline gel for nasal moisture to prevent dryness. Continue using CPAP with current settings. Ensure CPAP equipment is clean and use distilled water in the water chamber. Encourage weight management through a healthy diet and staying active.  History of cerebrovascular accident (stroke) He experienced a stroke in April with initial left-sided weakness and speech problems, which resolved within hours after thrombolytic therapy. Currently, there is occasional left-sided weakness but no significant residual deficits.  Continue Plavix  as prescribed. Follow up with Team with Neuro, PCP and  cardiology for ongoing management.  Obesity -BMI 28 .  Continue with healthy weight loss  Plan  Patient Instructions  Continue on CPAP At bedtime   Keep up good work  Saline nasal spray Twice daily   Saline nasal gel  At bedtime   Do not drive if sleepy  Work on healthy weight .  Follow up with Dr. Neda or Heywood Tokunaga NP in 1 year and As needed         Tony Stank, NP 04/27/2024

## 2024-04-27 NOTE — Patient Instructions (Signed)
 Continue on CPAP At bedtime   Keep up good work  Saline nasal spray Twice daily   Saline nasal gel  At bedtime   Do not drive if sleepy  Work on healthy weight .  Follow up with Dr. Neda or Kylei Purington NP in 1 year and As needed

## 2024-05-05 DIAGNOSIS — G4733 Obstructive sleep apnea (adult) (pediatric): Secondary | ICD-10-CM | POA: Diagnosis not present

## 2024-05-11 ENCOUNTER — Telehealth: Payer: Self-pay

## 2024-05-11 ENCOUNTER — Ambulatory Visit (INDEPENDENT_AMBULATORY_CARE_PROVIDER_SITE_OTHER): Admitting: Family Medicine

## 2024-05-11 ENCOUNTER — Ambulatory Visit: Attending: Family Medicine

## 2024-05-11 ENCOUNTER — Encounter: Payer: Self-pay | Admitting: Family Medicine

## 2024-05-11 VITALS — BP 124/62 | HR 71 | Temp 98.1°F | Ht 69.96 in | Wt 202.0 lb

## 2024-05-11 DIAGNOSIS — I10 Essential (primary) hypertension: Secondary | ICD-10-CM | POA: Diagnosis not present

## 2024-05-11 DIAGNOSIS — I639 Cerebral infarction, unspecified: Secondary | ICD-10-CM | POA: Diagnosis not present

## 2024-05-11 DIAGNOSIS — M25559 Pain in unspecified hip: Secondary | ICD-10-CM

## 2024-05-11 DIAGNOSIS — E1149 Type 2 diabetes mellitus with other diabetic neurological complication: Secondary | ICD-10-CM | POA: Diagnosis not present

## 2024-05-11 DIAGNOSIS — E7800 Pure hypercholesterolemia, unspecified: Secondary | ICD-10-CM | POA: Diagnosis not present

## 2024-05-11 DIAGNOSIS — Z0000 Encounter for general adult medical examination without abnormal findings: Secondary | ICD-10-CM | POA: Diagnosis not present

## 2024-05-11 DIAGNOSIS — Z794 Long term (current) use of insulin: Secondary | ICD-10-CM

## 2024-05-11 DIAGNOSIS — Z7189 Other specified counseling: Secondary | ICD-10-CM

## 2024-05-11 LAB — CUP PACEART REMOTE DEVICE CHECK
Date Time Interrogation Session: 20250903062343
Implantable Pulse Generator Implant Date: 20250501

## 2024-05-11 MED ORDER — METFORMIN HCL ER 500 MG PO TB24: 500.0000 mg | ORAL_TABLET | Freq: Every day | ORAL | Status: DC

## 2024-05-11 NOTE — Patient Instructions (Addendum)
 Please call the GI clinic and let me know if you can't get scheduled.  Go to the lab on the way out.   If you have mychart we'll likely use that to update you.    Take care.  Glad to see you. Try icing your left hip.

## 2024-05-11 NOTE — Patient Outreach (Signed)
 Complex Care Management Care Guide Note  05/11/2024 Name: BRELAND TROUTEN MRN: [id number] DOB: 02/20/1961  KIRILL CHATTERJEE is a 63 y.o. year old male who is a primary care patient of Cleatus Arlyss RAMAN, MD and is actively engaged with the care management team. I reached out to Lonni CHRISTELLA Salmon by phone today to assist with re-scheduling  with the RN Case Manager.  Follow up plan: Telephone appointment with complex care management team member scheduled for:  05/25/24  Shereen Gin [facility]  Population Health VBCI Assistant Direct Dial: [phone]  Fax: [phone] Website: [email or web address]

## 2024-05-11 NOTE — Telephone Encounter (Signed)
 Patient had an appointment scheduled for today and for 9/5. Per Dr. Cleatus patient can combine appointment all under physical. Spoke with patient this morning and advised him of this. Patient agreed that he would come in today and be seen and the 9/5 appointment would be canceled

## 2024-05-11 NOTE — Progress Notes (Signed)
 Tripped on a cord last night.  Hit on L side.  L hip sore along with R hamstring.  He has leftover oxycodone  to use, w/o ADE on med.  Taking tylenol  as needed.   Had episodes with lightheadedness prev, with some episodes of room spinning.  This is separate from the above.    Flu due this fall.  Shingles prev done.   PNA prev done Tetanus 2011 Covid prev done.   D/w patient mz:neupnwd for colon cancer screening, including IFOB vs. colonoscopy.  Risks and benefits of both were discussed and patient voiced understanding.  Pt elects for: colonoscopy.  He can call to schedule.   PSA 2025 Advance directive d/w pt.  Wife would be designated if patient were incapacitated.    Diabetes:  Using medications without difficulties: He was able to tolerate 500mg  metformin  XR per day. Taking 48 units insulin .  Had been taking ozempic  2mg  per week, then changed back to 1mg  pens.   Sugar at goal 91% on meter report.  Feet problems: no tingling.   eye exam within last year: yes Labs pending.   Hypertension:    Using medication without problems or lightheadedness: see above.   Chest pain with exertion:no Edema:no Short of breath: no change from baseline.   Elevated Cholesterol: Using medications without problems:yes Muscle aches: not likely from statin Diet compliance: d/w pt.  Exercise: d/w pt.  Labs pending  PMH and SH reviewed  Meds, vitals, and allergies reviewed.   ROS: Per HPI.  Unless specifically indicated otherwise in HPI, the patient denies:  General: fever. Eyes: acute vision changes ENT: sore throat Cardiovascular: chest pain Respiratory: SOB GI: vomiting GU: dysuria Musculoskeletal: acute back pain Derm: acute rash Neuro: acute motor dysfunction Psych: worsening mood Endocrine: polydipsia Heme: bleeding Allergy: hayfever  GEN: nad, alert and oriented HEENT: ncat NECK: supple w/o LA CV: rrr. PULM: ctab, no inc wob ABD: soft, +bs EXT: no edema SKIN: no acute  rash L greater troch ttp.

## 2024-05-12 ENCOUNTER — Ambulatory Visit: Payer: Self-pay | Admitting: Cardiology

## 2024-05-12 ENCOUNTER — Encounter: Admitting: Family Medicine

## 2024-05-12 LAB — CBC WITH DIFFERENTIAL/PLATELET
Basophils Absolute: 0.1 K/uL (ref 0.0–0.1)
Basophils Relative: 0.7 % (ref 0.0–3.0)
Eosinophils Absolute: 0.2 K/uL (ref 0.0–0.7)
Eosinophils Relative: 2.5 % (ref 0.0–5.0)
HCT: 42.3 % (ref 39.0–52.0)
Hemoglobin: 14.1 g/dL (ref 13.0–17.0)
Lymphocytes Relative: 20.5 % (ref 12.0–46.0)
Lymphs Abs: 1.7 K/uL (ref 0.7–4.0)
MCHC: 33.4 g/dL (ref 30.0–36.0)
MCV: 90.2 fl (ref 78.0–100.0)
Monocytes Absolute: 0.9 K/uL (ref 0.1–1.0)
Monocytes Relative: 10.3 % (ref 3.0–12.0)
Neutro Abs: 5.6 K/uL (ref 1.4–7.7)
Neutrophils Relative %: 66 % (ref 43.0–77.0)
Platelets: 194 K/uL (ref 150.0–400.0)
RBC: 4.69 Mil/uL (ref 4.22–5.81)
RDW: 13.5 % (ref 11.5–15.5)
WBC: 8.5 K/uL (ref 4.0–10.5)

## 2024-05-12 LAB — COMPREHENSIVE METABOLIC PANEL WITH GFR
ALT: 25 U/L (ref 0–53)
AST: 23 U/L (ref 0–37)
Albumin: 4.4 g/dL (ref 3.5–5.2)
Alkaline Phosphatase: 79 U/L (ref 39–117)
BUN: 21 mg/dL (ref 6–23)
CO2: 27 meq/L (ref 19–32)
Calcium: 8.9 mg/dL (ref 8.4–10.5)
Chloride: 106 meq/L (ref 96–112)
Creatinine, Ser: 1.73 mg/dL — ABNORMAL HIGH (ref 0.40–1.50)
GFR: 41.3 mL/min — ABNORMAL LOW (ref >60.00)
Glucose, Bld: 84 mg/dL (ref 70–99)
Potassium: 3.8 meq/L (ref 3.5–5.1)
Sodium: 141 meq/L (ref 135–145)
Total Bilirubin: 0.6 mg/dL (ref 0.2–1.2)
Total Protein: 6.9 g/dL (ref 6.0–8.3)

## 2024-05-12 LAB — TSH: TSH: 1.48 u[IU]/mL (ref 0.35–5.50)

## 2024-05-12 LAB — LIPID PANEL
Cholesterol: 110 mg/dL (ref 0–200)
HDL: 46.1 mg/dL (ref >39.00)
LDL Cholesterol: 47 mg/dL (ref 0–99)
NonHDL: 63.71
Total CHOL/HDL Ratio: 2
Triglycerides: 82 mg/dL (ref 0.0–149.0)
VLDL: 16.4 mg/dL (ref 0.0–40.0)

## 2024-05-12 LAB — VITAMIN D 25 HYDROXY (VIT D DEFICIENCY, FRACTURES): VITD: 28.98 ng/mL — ABNORMAL LOW (ref 30.00–100.00)

## 2024-05-12 LAB — MICROALBUMIN / CREATININE URINE RATIO
Creatinine,U: 334.2 mg/dL
Microalb Creat Ratio: 11.5 mg/g (ref 0.0–30.0)
Microalb, Ur: 3.8 mg/dL — ABNORMAL HIGH (ref 0.0–1.9)

## 2024-05-12 LAB — HEMOGLOBIN A1C: Hgb A1c MFr Bld: 7.2 % — ABNORMAL HIGH (ref 4.6–6.5)

## 2024-05-12 LAB — VITAMIN B12: Vitamin B-12: 347 pg/mL (ref 211–911)

## 2024-05-14 ENCOUNTER — Encounter: Payer: Self-pay | Admitting: Family Medicine

## 2024-05-14 ENCOUNTER — Ambulatory Visit: Payer: Self-pay | Admitting: Family Medicine

## 2024-05-14 DIAGNOSIS — E559 Vitamin D deficiency, unspecified: Secondary | ICD-10-CM

## 2024-05-14 DIAGNOSIS — R7989 Other specified abnormal findings of blood chemistry: Secondary | ICD-10-CM

## 2024-05-14 MED ORDER — VITAMIN D3 25 MCG (1000 UT) PO CAPS: 1000.0000 [IU] | ORAL_CAPSULE | Freq: Every day | ORAL | Status: DC

## 2024-05-14 NOTE — Assessment & Plan Note (Signed)
 Continue atorvastatin 

## 2024-05-14 NOTE — Assessment & Plan Note (Signed)
 Flu due this fall.  Shingles prev done.   PNA prev done Tetanus 2011 Covid prev done.   D/w patient mz:neupnwd for colon cancer screening, including IFOB vs. colonoscopy.  Risks and benefits of both were discussed and patient voiced understanding.  Pt elects for: colonoscopy.  He can call to schedule.   PSA 2025 Advance directive d/w pt.  Wife would be designated if patient were incapacitated.

## 2024-05-14 NOTE — Assessment & Plan Note (Signed)
Advance directive d/w pt.  Wife would be designated if patient were incapacitated.

## 2024-05-14 NOTE — Assessment & Plan Note (Signed)
 L greater troch ttp.   Okay to defer imaging at this point.  D/w pt about icing, can use tylenol  vs oxycodone  if needed.

## 2024-05-14 NOTE — Assessment & Plan Note (Addendum)
 Continue amlodipine  carvedilol  losartan .  See notes on labs.  Orthostatic cautions d/w pt.

## 2024-05-14 NOTE — Assessment & Plan Note (Signed)
 He was able to tolerate 500mg  metformin  XR per day. Taking 48 units insulin .  Had been taking ozempic  2mg  per week, then changed back to 1mg  pens.   Sugar at goal 91% on meter report.  See notes on labs.  No change in meds at this point.

## 2024-05-15 ENCOUNTER — Ambulatory Visit: Payer: Self-pay | Admitting: Podiatry

## 2024-05-20 ENCOUNTER — Other Ambulatory Visit: Payer: Self-pay | Admitting: Nurse Practitioner

## 2024-05-20 NOTE — Progress Notes (Signed)
 Remote Loop Recorder Transmission

## 2024-05-22 ENCOUNTER — Other Ambulatory Visit: Payer: Self-pay | Admitting: Family Medicine

## 2024-05-22 ENCOUNTER — Ambulatory Visit: Admitting: Podiatry

## 2024-05-25 ENCOUNTER — Telehealth: Payer: Self-pay

## 2024-05-27 ENCOUNTER — Emergency Department (HOSPITAL_COMMUNITY)

## 2024-05-27 ENCOUNTER — Emergency Department (HOSPITAL_COMMUNITY)
Admission: EM | Admit: 2024-05-27 | Discharge: 2024-05-27 | Disposition: A | Discharge status: Home / Self Care | Attending: Emergency Medicine | Admitting: Emergency Medicine

## 2024-05-27 DIAGNOSIS — Z823 Family history of stroke: Secondary | ICD-10-CM | POA: Insufficient documentation

## 2024-05-27 DIAGNOSIS — Z833 Family history of diabetes mellitus: Secondary | ICD-10-CM | POA: Insufficient documentation

## 2024-05-27 DIAGNOSIS — R29818 Other symptoms and signs involving the nervous system: Secondary | ICD-10-CM | POA: Diagnosis not present

## 2024-05-27 DIAGNOSIS — N189 Chronic kidney disease, unspecified: Secondary | ICD-10-CM | POA: Insufficient documentation

## 2024-05-27 DIAGNOSIS — T679XXA Effect of heat and light, unspecified, initial encounter: Secondary | ICD-10-CM | POA: Diagnosis not present

## 2024-05-27 DIAGNOSIS — R5383 Other fatigue: Secondary | ICD-10-CM | POA: Diagnosis not present

## 2024-05-27 DIAGNOSIS — R200 Anesthesia of skin: Secondary | ICD-10-CM | POA: Insufficient documentation

## 2024-05-27 DIAGNOSIS — R29898 Other symptoms and signs involving the musculoskeletal system: Secondary | ICD-10-CM

## 2024-05-27 DIAGNOSIS — Z79899 Other long term (current) drug therapy: Secondary | ICD-10-CM | POA: Insufficient documentation

## 2024-05-27 DIAGNOSIS — Z8249 Family history of ischemic heart disease and other diseases of the circulatory system: Secondary | ICD-10-CM | POA: Insufficient documentation

## 2024-05-27 DIAGNOSIS — I2510 Atherosclerotic heart disease of native coronary artery without angina pectoris: Secondary | ICD-10-CM | POA: Insufficient documentation

## 2024-05-27 DIAGNOSIS — Z8673 Personal history of transient ischemic attack (TIA), and cerebral infarction without residual deficits: Secondary | ICD-10-CM | POA: Insufficient documentation

## 2024-05-27 DIAGNOSIS — Z7902 Long term (current) use of antithrombotics/antiplatelets: Secondary | ICD-10-CM | POA: Insufficient documentation

## 2024-05-27 DIAGNOSIS — E1122 Type 2 diabetes mellitus with diabetic chronic kidney disease: Secondary | ICD-10-CM | POA: Diagnosis not present

## 2024-05-27 DIAGNOSIS — R519 Headache, unspecified: Secondary | ICD-10-CM | POA: Diagnosis not present

## 2024-05-27 DIAGNOSIS — I639 Cerebral infarction, unspecified: Secondary | ICD-10-CM | POA: Diagnosis not present

## 2024-05-27 DIAGNOSIS — R531 Weakness: Secondary | ICD-10-CM | POA: Insufficient documentation

## 2024-05-27 DIAGNOSIS — Z794 Long term (current) use of insulin: Secondary | ICD-10-CM | POA: Diagnosis not present

## 2024-05-27 DIAGNOSIS — Z7984 Long term (current) use of oral hypoglycemic drugs: Secondary | ICD-10-CM | POA: Diagnosis not present

## 2024-05-27 DIAGNOSIS — G8190 Hemiplegia, unspecified affecting unspecified side: Secondary | ICD-10-CM | POA: Diagnosis not present

## 2024-05-27 DIAGNOSIS — I129 Hypertensive chronic kidney disease with stage 1 through stage 4 chronic kidney disease, or unspecified chronic kidney disease: Secondary | ICD-10-CM | POA: Insufficient documentation

## 2024-05-27 DIAGNOSIS — R079 Chest pain, unspecified: Secondary | ICD-10-CM | POA: Diagnosis not present

## 2024-05-27 DIAGNOSIS — G459 Transient cerebral ischemic attack, unspecified: Secondary | ICD-10-CM | POA: Diagnosis not present

## 2024-05-27 LAB — I-STAT CHEM 8, ED
BUN: 25 mg/dL — ABNORMAL HIGH (ref 8–23)
Calcium, Ion: 1.2 mmol/L (ref 1.15–1.40)
Chloride: 110 mmol/L (ref 98–111)
Creatinine, Ser: 1.6 mg/dL — ABNORMAL HIGH (ref 0.61–1.24)
Glucose, Bld: 74 mg/dL (ref 70–99)
HCT: 41 % (ref 39.0–52.0)
Hemoglobin: 13.9 g/dL (ref 13.0–17.0)
Potassium: 4.1 mmol/L (ref 3.5–5.1)
Sodium: 142 mmol/L (ref 135–145)
TCO2: 21 mmol/L — ABNORMAL LOW (ref 22–32)

## 2024-05-27 LAB — CBC
HCT: 42 % (ref 39.0–52.0)
Hemoglobin: 14.1 g/dL (ref 13.0–17.0)
MCH: 30 pg (ref 26.0–34.0)
MCHC: 33.6 g/dL (ref 30.0–36.0)
MCV: 89.4 fL (ref 80.0–100.0)
Platelets: 228 K/uL (ref 150–400)
RBC: 4.7 MIL/uL (ref 4.22–5.81)
RDW: 12.7 % (ref 11.5–15.5)
WBC: 9.5 K/uL (ref 4.0–10.5)
nRBC: 0 % (ref 0.0–0.2)

## 2024-05-27 LAB — COMPREHENSIVE METABOLIC PANEL WITH GFR
ALT: 33 U/L (ref 0–44)
AST: 37 U/L (ref 15–41)
Albumin: 4.1 g/dL (ref 3.5–5.0)
Alkaline Phosphatase: 69 U/L (ref 38–126)
Anion gap: 11 (ref 5–15)
BUN: 22 mg/dL (ref 8–23)
CO2: 21 mmol/L — ABNORMAL LOW (ref 22–32)
Calcium: 9.7 mg/dL (ref 8.9–10.3)
Chloride: 109 mmol/L (ref 98–111)
Creatinine, Ser: 1.56 mg/dL — ABNORMAL HIGH (ref 0.61–1.24)
GFR, Estimated: 49 mL/min — ABNORMAL LOW (ref >60)
Glucose, Bld: 77 mg/dL (ref 70–99)
Potassium: 4.1 mmol/L (ref 3.5–5.1)
Sodium: 141 mmol/L (ref 135–145)
Total Bilirubin: 1 mg/dL (ref 0.0–1.2)
Total Protein: 7.1 g/dL (ref 6.5–8.1)

## 2024-05-27 LAB — DIFFERENTIAL
Abs Immature Granulocytes: 0.02 K/uL (ref 0.00–0.07)
Basophils Absolute: 0 K/uL (ref 0.0–0.1)
Basophils Relative: 0 %
Eosinophils Absolute: 0.2 K/uL (ref 0.0–0.5)
Eosinophils Relative: 2 %
Immature Granulocytes: 0 %
Lymphocytes Relative: 17 %
Lymphs Abs: 1.6 K/uL (ref 0.7–4.0)
Monocytes Absolute: 0.7 K/uL (ref 0.1–1.0)
Monocytes Relative: 7 %
Neutro Abs: 7 K/uL (ref 1.7–7.7)
Neutrophils Relative %: 74 %

## 2024-05-27 LAB — URINALYSIS, W/ REFLEX TO CULTURE (INFECTION SUSPECTED)
Bacteria, UA: NONE SEEN
Bilirubin Urine: NEGATIVE
Glucose, UA: NEGATIVE mg/dL
Ketones, ur: NEGATIVE mg/dL
Leukocytes,Ua: NEGATIVE
Nitrite: NEGATIVE
Protein, ur: NEGATIVE mg/dL
Specific Gravity, Urine: 1.015 (ref 1.005–1.030)
pH: 5 (ref 5.0–8.0)

## 2024-05-27 LAB — APTT: aPTT: 29 s (ref 24–36)

## 2024-05-27 LAB — TROPONIN I (HIGH SENSITIVITY)
Troponin I (High Sensitivity): 6 ng/L (ref <18)
Troponin I (High Sensitivity): 5 ng/L

## 2024-05-27 LAB — BRAIN NATRIURETIC PEPTIDE: B Natriuretic Peptide: 2.6 pg/mL (ref 0.0–100.0)

## 2024-05-27 LAB — PROTIME-INR
INR: 1.1 (ref 0.8–1.2)
Prothrombin Time: 14.5 s (ref 11.4–15.2)

## 2024-05-27 LAB — CBG MONITORING, ED: Glucose-Capillary: 77 mg/dL (ref 70–99)

## 2024-05-27 LAB — ETHANOL: Alcohol, Ethyl (B): <15 mg/dL (ref <15)

## 2024-05-27 MED ORDER — SODIUM CHLORIDE 0.9% FLUSH
3.0000 mL | Freq: Once | INTRAVENOUS | Status: AC
  Administered 2024-05-27: 3 mL via INTRAVENOUS

## 2024-05-27 NOTE — Discharge Instructions (Signed)
 Your history, exam, and evaluation today did not show evidence of acute stroke or blockage causing your symptoms.  Neurology felt this is likely something called recrudescence or you get similar stroke symptoms to prior but not actually a stroke today.  The rest of your workup was overall reassuring and we feel you are safe for discharge home.  Please rest and stay hydrated and follow-up with your primary doctor and your neurologist.  If any symptoms change or worsen acutely, please return to the nearest emergency department.

## 2024-05-27 NOTE — ED Provider Notes (Signed)
 [place] EMERGENCY DEPARTMENT AT [facility] Provider Note   CSN: [id number] Arrival date & time: 05/27/24  1506     Patient presents with: No chief complaint on file.   Tony Manning is a 63 y.o. male.   The history is provided by the patient and medical records. No language interpreter was used.  Cerebrovascular Accident This is a new problem. The current episode started 1 to 2 hours ago. The problem occurs constantly. The problem has been rapidly improving. Associated symptoms include headaches. Pertinent negatives include no chest pain, no abdominal pain and no shortness of breath. Nothing aggravates the symptoms. Nothing relieves the symptoms. He has tried nothing for the symptoms. The treatment provided no relief.       Prior to Admission medications   Medication Sig Start Date End Date Taking? Authorizing Provider  amLODipine  (NORVASC ) 5 MG tablet Take 1 tablet (5 mg total) by mouth daily. 05/22/24   Wyn Jackee VEAR Mickey., NP  atorvastatin  (LIPITOR ) 40 MG tablet TAKE 1 TABLET BY MOUTH EVERY MORNING 05/22/24   Cleatus Arlyss RAMAN, MD  carvedilol  (COREG ) 3.125 MG tablet TAKE 1 TABLET BY MOUTH TWICE A DAY 02/21/24   Cleatus Arlyss RAMAN, MD  Cholecalciferol (VITAMIN D3) 25 MCG (1000 UT) CAPS Take 1 capsule (1,000 Units total) by mouth daily. 05/14/24   Cleatus Arlyss RAMAN, MD  clopidogrel  (PLAVIX ) 75 MG tablet Take 1 tablet (75 mg total) by mouth daily. 02/03/24   Rosemarie Eather RAMAN, MD  Continuous Glucose Sensor (FREESTYLE LIBRE 3 PLUS SENSOR) MISC USE TO CHECK BLOOD SUGAR CONTINUOUSLY. CHANGE SENSOR EVERY 15 DAYS. 01/10/24   Cleatus Arlyss RAMAN, MD  cyclobenzaprine  (FLEXERIL ) 5 MG tablet TAKE 1 TABLET BY MOUTH EVERYDAY AT BEDTIME 02/04/24   Cleatus Arlyss RAMAN, MD  gabapentin  (NEURONTIN ) 300 MG capsule TAKE 1-2 CAPSULES BY MOUTH IN THE EVENING. MAY CAUSE SEDATION 04/03/24   Cleatus Arlyss RAMAN, MD  insulin  degludec (TRESIBA  FLEXTOUCH) 100 UNIT/ML FlexTouch Pen Inject 46-48 Units into the skin daily.  03/07/24   Cleatus Arlyss RAMAN, MD  losartan  (COZAAR ) 50 MG tablet TAKE 1 TABLET BY MOUTH AT BEDTIME 02/21/24   Cleatus Arlyss RAMAN, MD  metFORMIN  (GLUCOPHAGE -XR) 500 MG 24 hr tablet Take 1 tablet (500 mg total) by mouth at bedtime. 05/11/24   Cleatus Arlyss RAMAN, MD  nitroGLYCERIN  (NITROSTAT ) 0.4 MG SL tablet Place 1 tablet (0.4 mg total) under the tongue every 5 (five) minutes as needed for chest pain. 01/07/24   Wyn Jackee VEAR Mickey., NP  ondansetron  (ZOFRAN ) 4 MG tablet Take 4 mg by mouth daily as needed for nausea or vomiting. 02/09/20   [provider]  oxyCODONE -acetaminophen  (PERCOCET/ROXICET) 5-325 MG tablet Take 0.5-1 tablets by mouth every 6 (six) hours as needed for severe pain. 08/11/21   [provider]  pantoprazole  (PROTONIX ) 40 MG tablet TAKE 1 TABLET BY MOUTH EVERY MORNING 04/14/24   Cleatus Arlyss RAMAN, MD  Semaglutide , 1 MG/DOSE, (OZEMPIC , 1 MG/DOSE,) 4 MG/3ML SOPN INJECT 1 MG AS DIRECTED ONCE A WEEK. 12/27/23   Cleatus Arlyss RAMAN, MD  Study - LIBREXIA-STROKE - milvexian 25 mg or placebo tablet (PI-Sethi) Take 1 tablet by mouth 2 (two) times daily. For Investigational Use Only. Bring all bottles back to next visit. Contact [facility] for questions about this medication. 04/03/24   Rosemarie Eather RAMAN, MD  tamsulosin  (FLOMAX ) 0.4 MG CAPS capsule Take 2 capsules (0.8 mg total) by mouth daily. 11/01/23   Cleatus Arlyss RAMAN, MD  Allergies: Ambien [zolpidem], Lisinopril, Metformin  and related, and Tramadol     Review of Systems  Constitutional:  Negative for chills, fatigue and fever.  HENT:  Negative for congestion.   Eyes:  Negative for visual disturbance.  Respiratory:  Negative for cough, chest tightness and shortness of breath.   Cardiovascular:  Negative for chest pain.  Gastrointestinal:  Negative for abdominal pain, constipation, diarrhea, nausea and vomiting.  Genitourinary:  Negative for dysuria.  Musculoskeletal:  Negative for back pain.  Skin:  Negative for wound.   Neurological:  Positive for weakness, numbness and headaches. Negative for facial asymmetry, speech difficulty and light-headedness.  Psychiatric/Behavioral:  Negative for agitation.   All other systems reviewed and are negative.   Updated Vital Signs BP 136/68   Pulse 71   Temp 98.2 F (36.8 C) (Oral)   Resp 13   Wt 89 kg   SpO2 100%   BMI 28.18 kg/m   Physical Exam Vitals and nursing note reviewed.  Constitutional:      General: He is not in acute distress.    Appearance: He is well-developed. He is not ill-appearing, toxic-appearing or diaphoretic.  HENT:     Head: Normocephalic and atraumatic.     Nose: No congestion or rhinorrhea.     Mouth/Throat:     Mouth: Mucous membranes are moist.     Pharynx: No oropharyngeal exudate or posterior oropharyngeal erythema.  Eyes:     Extraocular Movements: Extraocular movements intact.     Conjunctiva/sclera: Conjunctivae normal.     Pupils: Pupils are equal, round, and reactive to light.  Cardiovascular:     Rate and Rhythm: Normal rate and regular rhythm.     Heart sounds: No murmur heard. Pulmonary:     Effort: Pulmonary effort is normal. No respiratory distress.     Breath sounds: Normal breath sounds. No wheezing, rhonchi or rales.  Chest:     Chest wall: No tenderness.  Abdominal:     General: Abdomen is flat.     Palpations: Abdomen is soft.     Tenderness: There is no abdominal tenderness.  Musculoskeletal:        General: No swelling or tenderness.     Cervical back: Neck supple.     Right lower leg: No edema.     Left lower leg: No edema.  Skin:    General: Skin is warm and dry.     Capillary Refill: Capillary refill takes less than 2 seconds.     Findings: No erythema or rash.  Neurological:     Mental Status: He is alert.     Sensory: Sensory deficit present.     Motor: Weakness present.  Psychiatric:        Mood and Affect: Mood normal.     (all labs ordered are listed, but only abnormal results are  displayed) Labs Reviewed  COMPREHENSIVE METABOLIC PANEL WITH GFR - Abnormal; Notable for the following components:      Result Value   CO2 21 (*)    Creatinine, Ser 1.56 (*)    GFR, Estimated 49 (*)    All other components within normal limits  URINALYSIS, W/ REFLEX TO CULTURE (INFECTION SUSPECTED) - Abnormal; Notable for the following components:   Hgb urine dipstick SMALL (*)    All other components within normal limits  I-STAT CHEM 8, ED - Abnormal; Notable for the following components:   BUN 25 (*)    Creatinine, Ser 1.60 (*)    TCO2 21 (*)  All other components within normal limits  PROTIME-INR  APTT  CBC  DIFFERENTIAL  ETHANOL  BRAIN NATRIURETIC PEPTIDE  CBG MONITORING, ED  TROPONIN I (HIGH SENSITIVITY)  TROPONIN I (HIGH SENSITIVITY)    EKG: EKG Interpretation Date/Time:  Saturday May 27 2024 15:50:10 EDT Ventricular Rate:  68 PR Interval:  134 QRS Duration:  86 QT Interval:  372 QTC Calculation: 396 R Axis:   34  Text Interpretation: Sinus rhythm when compared to prior, similar appearance no STEMI Confirmed by Ginger Barefoot ([id number]) on 05/27/2024 5:51:19 PM  Radiology: DG Chest Portable 1 View Result Date: 05/27/2024 CLINICAL DATA:  Tightness EXAM: PORTABLE CHEST 1 VIEW COMPARISON:  Chest x-ray 02/16/2022 FINDINGS: Loop recorder device overlies the left chest. The heart size and mediastinal contours are within normal limits. Both lungs are clear. The visualized skeletal structures are unremarkable. IMPRESSION: No active disease. Electronically Signed   By: Greig Pique M.D.   On: 05/27/2024 16:27   MR BRAIN WO CONTRAST Result Date: 05/27/2024 CLINICAL DATA:  Neuro deficit, acute, stroke suspected EXAM: MRI HEAD WITHOUT CONTRAST TECHNIQUE: Multiplanar, multiecho pulse sequences of the brain and surrounding structures were obtained without intravenous contrast. COMPARISON:  Same day CT head. FINDINGS: Brain: No acute infarction, hemorrhage, hydrocephalus,  extra-axial collection or mass lesion. Vascular: Normal flow voids. Skull and upper cervical spine: Normal marrow signal. Sinuses/Orbits: Negative. IMPRESSION: No evidence of acute intracranial abnormality. Electronically Signed   By: Gilmore GORMAN Molt M.D.   On: 05/27/2024 16:05   CT HEAD CODE STROKE WO CONTRAST Result Date: 05/27/2024 CLINICAL DATA:  Code stroke. Neuro deficit, acute, stroke suspected. Left-sided weakness. Headache. EXAM: CT HEAD WITHOUT CONTRAST TECHNIQUE: Contiguous axial images were obtained from the base of the skull through the vertex without intravenous contrast. RADIATION DOSE REDUCTION: This exam was performed according to the departmental dose-optimization program which includes automated exposure control, adjustment of the mA and/or kV according to patient size and/or use of iterative reconstruction technique. COMPARISON:  Head CT 01/04/2024 and MRI 01/05/2024 FINDINGS: Brain: There is no evidence of an acute infarct, intracranial hemorrhage, mass, midline shift, or extra-axial fluid collection. Cerebral volume is normal for age. No significant encephalomalacia is apparent by CT at the sites of the acute cerebellar infarcts on the prior MRI. The ventricles are normal in size. Vascular: No hyperdense vessel. Skull: No fracture or suspicious lesion. Sinuses/Orbits: Paranasal sinuses and mastoid air cells are clear. Unremarkable orbits. Other: None. ASPECTS (Alberta Stroke Program Early CT Score) - Ganglionic level infarction (caudate, lentiform nuclei, internal capsule, insula, M1-M3 cortex): 7 - Supraganglionic infarction (M4-M6 cortex): 3 Total score (0-10 with 10 being normal): 10 These results were communicated to Dr. Voncile at 3:25 pm on 05/27/2024 by text page via the [facility] messaging system. IMPRESSION: No evidence of acute intracranial abnormality. ASPECTS of 10. Electronically Signed   By: Dasie Hamburg M.D.   On: 05/27/2024 15:26     Procedures   Medications Ordered in the  ED  sodium chloride  flush (NS) 0.9 % injection 3 mL (3 mLs Intravenous Given 05/27/24 1600)                                    Medical Decision Making Amount and/or Complexity of Data Reviewed Labs: ordered. Radiology: ordered.    LEANDRO BERKOWITZ is a 63 y.o. male with a past medical history significant for hypertension, diabetes, diverticulosis, CAD with previous MI, CKD, GERD,  sarcoidosis, migraines, and previous stroke with no reported residual deficits who presents as a code stroke.  According to EMS, he was at his baseline at 2 PM when he was pressure washing his deck.  He said that he went to cool off and had profound weakness on his left side.  Also reporting numbness in the left face left arm and left leg.  He does say that the symptoms have improved somewhat during transport and his glucose was 88 per EMS.  There was no fall or trauma he gently sat down with the weakness.  He denied any facial droop.  He did report his pain was 8 out of 10 in severity with a headache but now it is improving is a 5 out of 10.  Otherwise he denied complaints.  On exam, lungs clear.  Chest nontender.  Abdomen nontender.  Symmetric smile and speech is clear however he does have numbness in left face left arm and left leg compared to the right and has weakness in left leg and left arm compared to the right.  No facial droop was seen.  Patient quickly taken to CT scanner for workup.  Anticipate follow-up on neurology recommendations and workup results.  6:03 PM Imaging returned without evidence of acute strokes.  Chest x-ray not show pneumonia.  Labs overall similar to prior.  Neurology felt that if workup reassuring he is likely safe for discharge home for outpatient follow-up.  As neurology was suspicious of recrudescence to previous stroke, they did recommend workup including urinalysis to look for UTI.  Will wait for the urinalysis and then reassess.  Anticipate discharge after workup.  8:16  PM Patient's urinalysis does not show UTI and rest of workup reassuring.  Patient be discharged and he is feeling better now.  Suspect recrudescence as described by neurology.  He will follow-up with his neurology team and PCP.  He no questions or concerns and was discharged in good condition.      Final diagnoses:  Left arm weakness  Transient neurological symptoms     Clinical Impression: 1. Left arm weakness   2. Transient neurological symptoms     Disposition: Discharge  Condition: Good  I have discussed the results, Dx and Tx plan with the pt(& family if present). He/she/they expressed understanding and agree(s) with the plan. Discharge instructions discussed at great length. Strict return precautions discussed and pt &/or family have verbalized understanding of the instructions. No further questions at time of discharge.    New Prescriptions   No medications on file    Follow Up: Cleatus Arlyss RAMAN, MD [address] [place] [address] [phone]     [facility] [address]     [address] [place]  [address] [phone]       Luane Rochon, Lonni PARAS, MD 05/27/24 2018

## 2024-05-27 NOTE — Code Documentation (Signed)
 Stroke Response Nurse Documentation Code Documentation  BRANNAN CASSEDY is a 63 y.o. male arriving to [facility]  via [facility] on 05/27/24 with past medical hx of CVA. On clopidogrel  75 mg daily and Librexia trial. Code stroke was activated by EMS.   Patient from home where he was LKW at 1400 and now complaining of left sided weakness and left sided headache. He was outside doing yard work and came in to get a drink. Upon entering the house his wife noticed him to suddenly go weak on the left side. He endorses feeling lightheaded while outside working.   Stroke team at the bedside on patient arrival. Labs drawn and patient cleared for CT by Dr. Cleotis. Patient to CT with team.   NIHSS 3, see documentation for details and code stroke times. Patient with left leg weakness, left arm limb ataxia, and left decreased sensation on exam.   The following imaging was completed:  CT Head and MRI.   Patient is not a candidate for IV Thrombolytic due to negative MRI. Patient is not a candidate for IR due to assessment and imaging negative for LVO.   Care Plan: code stroke cancelled at 1611.   Process Delays Noted: n/a  Bedside handoff with ED RN Chloe.    Kahealani Yankovich L Padme Arriaga  Rapid Response RN

## 2024-05-27 NOTE — Consult Note (Signed)
 NEUROLOGY CONSULT NOTE   Date of service: May 27, 2024 Patient Name: Tony Manning MRN:  [id number] DOB:  1960/10/22 Chief Complaint: Code stroke left-sided weakness Requesting Provider: Tegeler, Lonni PARAS, *  History of Present Illness  Tony Manning is a 63 y.o. male with hx of coronary artery disease status post stenting, diabetes, hypertension, hyperlipidemia, depression, CKD, sleep apnea on CPAP, sarcoidosis, migraines, prior cervical discectomy who was seen as a code stroke on 01/04/2024, given IV TNKase  and MRI revealed acute infarct in the left cerebellum as well as right occipital pole with CT angiogram head and neck with possible vertebral artery occlusion who now presents for sudden onset of left arm and leg weakness while working outside this afternoon. No residual deficits from last stroke Last known well was around 2 PM when he was at his job of power washing and had suddenly started feeling weak on the left side with a sudden onset of headache. He had no slurred speech or facial symptoms EMS was called and they activated a code stroke and brought him to the ER for further evaluation He is a part of the [place] trial with [employer] after his last stroke. [Librexia Stroke trial ( standard of care antiplatets w/wo Milvexian- a new Factor 11 inhibitor ] and is compliant to medications  LKW: 2 PM Modified rankin score: 1-No significant post stroke disability and can perform usual duties with stroke symptoms IV Thrombolysis: No-no stroke on MRI, also on factor XI inhibitor drug trial-blinded unknown if he is on study medication or placebo in addition to Plavix .  Plan was to reach out to the study team if MRI is positive for stroke without flair changes, which would have made him a candidate for IV thrombolysis EVT: No signs of LVO on exam  NIHSS components Score: Comment  1a Level of Conscious 0[x]  1[]  2[]  3[]      1b LOC Questions 0[x]  1[]  2[]       1c LOC Commands  0[x]  1[]  2[]       2 Best Gaze 0[x]  1[]  2[]       3 Visual 0[x]  1[]  2[]  3[]      4 Facial Palsy 0[x]  1[]  2[]  3[]      5a Motor Arm - left 0[]  1[x]  2[]  3[]  4[]  UN[]    5b Motor Arm - Right 0[x]  1[]  2[]  3[]  4[]  UN[]    6a Motor Leg - Left 0[]  1[x]  2[]  3[]  4[]  UN[]    6b Motor Leg - Right 0[x]  1[]  2[]  3[]  4[]  UN[]    7 Limb Ataxia 0[x]  1[]  2[]  UN[]      8 Sensory 0[]  1[x]  2[]  UN[]      9 Best Language 0[x]  1[]  2[]  3[]      10 Dysarthria 0[x]  1[]  2[]  UN[]      11 Extinct. and Inattention 0[x]  1[]  2[]       TOTAL: 3      ROS  Comprehensive ROS performed and pertinent positives documented in HPI   Past History   Past Medical History:  Diagnosis Date   Anginal pain (HCC)    Basal cell carcinoma of face    Chest pain, unspecified    Chronic kidney disease    kidney stones   Coronary atherosclerosis of native coronary artery    s/p stent x5   Depression    Diabetes mellitus without mention of complication    Diverticulosis    Dyspnea    Dysrhythmia    Erectile dysfunction    Essential hypertension, benign    Family history  of adverse reaction to anesthesia    GERD (gastroesophageal reflux disease)    Heart murmur    History of kidney stones    History of nephrolithiasis    Hyperlipidemia    Migraine    Myocardial infarction (HCC)    2016   Neuromuscular disorder (HCC)    Osteoarthritis    Pneumonia    Postsurgical percutaneous transluminal coronary angioplasty status    s/p stent x4   Sarcoidosis    Sleep apnea    uses CPAP   Unspecified sleep apnea    uses C-pap    Past Surgical History:  Procedure Laterality Date   ANTERIOR CERVICAL DECOMP/DISCECTOMY FUSION N/A 04/07/2017   Procedure: ANTERIOR CERVICAL DECOMPRESSION FUSION, CERVICAL 4-5 WITH INSTRUMENTATION AND ALLOGRAFT;  Surgeon: Beuford Anes, MD;  Location: [facility] OR;  Service: Orthopedics;  Laterality: N/A;  ANTERIOR CERVICAL DECOMPRESSION FUSION, CERVICAL 4-5 WITH INSTRUMENTATION AND ALLOGRAFT; REQUEST 2.5 HOURS AND FLIP  ROOM   APPENDECTOMY  2005   CARPAL TUNNEL RELEASE     CERVICAL FUSION  2009   COLONOSCOPY     CORONARY ANGIOPLASTY WITH STENT PLACEMENT     2004   CORONARY STENT INTERVENTION N/A 10/16/2016   Procedure: Coronary Stent Intervention;  Surgeon: Gordy Bergamo, MD;  Location: [facility] INVASIVE CV LAB;  Service: Cardiovascular;  Laterality: N/A;   CYSTOSCOPY WITH RETROGRADE PYELOGRAM, URETEROSCOPY AND STENT PLACEMENT Right 05/12/2019   Procedure: CYSTOSCOPY WITH RIGHT RETROGRADE PYELOGRAM, URETEROSCOPY HOLMIUM LASER AND STENT PLACEMENT;  Surgeon: Carolee Sherwood JONETTA DOUGLAS, MD;  Location: [facility] ORS;  Service: Urology;  Laterality: Right;   ELBOW SURGERY     bilateral   HAND SURGERY Left    KNEE SURGERY Bilateral    Bilateral    LEFT HEART CATH AND CORONARY ANGIOGRAPHY N/A 10/16/2016   Procedure: Left Heart Cath and Coronary Angiography;  Surgeon: Gordy Bergamo, MD;  Location: [facility] INVASIVE CV LAB;  Service: Cardiovascular;  Laterality: N/A;   LEFT HEART CATHETERIZATION WITH CORONARY ANGIOGRAM N/A 06/14/2014   Procedure: LEFT HEART CATHETERIZATION WITH CORONARY ANGIOGRAM;  Surgeon: Erick JONELLE Bergamo, MD;  Location: [facility] CATH LAB;  Service: Cardiovascular;  Laterality: N/A;   LOOP RECORDER INSERTION N/A 01/06/2024   Procedure: LOOP RECORDER INSERTION;  Surgeon: Lesia Ozell Barter, PA-C;  Location: [facility] INVASIVE CV LAB;  Service: Cardiovascular;  Laterality: N/A;   LUNG BIOPSY     sarcoid   ROTATOR CUFF REPAIR     left   SHOULDER ARTHROSCOPY WITH SUBACROMIAL DECOMPRESSION Left 06/16/2018   Procedure: LEFT SHOULDER ARTHROSCOPY WITH ROTATOR CUFF DEBRIDEMENT AND  SUBACROMIAL DECOMPRESSION;  Surgeon: Dozier Soulier, MD;  Location: [facility] OR;  Service: Orthopedics;  Laterality: Left;   TRANSESOPHAGEAL ECHOCARDIOGRAM (CATH LAB) N/A 01/06/2024   Procedure: TRANSESOPHAGEAL ECHOCARDIOGRAM;  Surgeon: Santo Stanly LABOR, MD;  Location: [facility] INVASIVE CV LAB;  Service: Cardiovascular;  Laterality: N/A;    Family History: Family  History  Problem Relation Age of Onset   Arthritis Mother    Depression Mother    Stroke Mother    Arthritis Father    Diabetes Father    Coronary artery disease Father    Colon cancer Neg Hx    Prostate cancer Neg Hx    Esophageal cancer Neg Hx    Rectal cancer Neg Hx    Stomach cancer Neg Hx     Social History  reports that he has never smoked. He quit smokeless tobacco use about 8 years ago.  His smokeless tobacco use included chew. He reports that he  does not drink alcohol and does not use drugs.  Allergies  Allergen Reactions   Ambien [Zolpidem] Other (See Comments)    Parasomnias, sleep walking   Lisinopril Cough   Metformin  And Related Other (See Comments)    GI upset with immediate release form   Tramadol  Diarrhea    Medications   Current Facility-Administered Medications:    sodium chloride  flush (NS) 0.9 % injection 3 mL, 3 mL, Intravenous, Once, Tegeler, Lonni PARAS, MD  Current Outpatient Medications:    amLODipine  (NORVASC ) 5 MG tablet, Take 1 tablet (5 mg total) by mouth daily., Disp: 90 tablet, Rfl: 3   atorvastatin  (LIPITOR ) 40 MG tablet, TAKE 1 TABLET BY MOUTH EVERY MORNING, Disp: 90 tablet, Rfl: 1   carvedilol  (COREG ) 3.125 MG tablet, TAKE 1 TABLET BY MOUTH TWICE A DAY, Disp: 180 tablet, Rfl: 2   Cholecalciferol (VITAMIN D3) 25 MCG (1000 UT) CAPS, Take 1 capsule (1,000 Units total) by mouth daily., Disp: , Rfl:    clopidogrel  (PLAVIX ) 75 MG tablet, Take 1 tablet (75 mg total) by mouth daily., Disp: 30 tablet, Rfl: 5   Continuous Glucose Sensor (FREESTYLE LIBRE 3 PLUS SENSOR) MISC, USE TO CHECK BLOOD SUGAR CONTINUOUSLY. CHANGE SENSOR EVERY 15 DAYS., Disp: 6 each, Rfl: 1   cyclobenzaprine  (FLEXERIL ) 5 MG tablet, TAKE 1 TABLET BY MOUTH EVERYDAY AT BEDTIME, Disp: 30 tablet, Rfl: 1   gabapentin  (NEURONTIN ) 300 MG capsule, TAKE 1-2 CAPSULES BY MOUTH IN THE EVENING. MAY CAUSE SEDATION, Disp: 180 capsule, Rfl: 1   insulin  degludec (TRESIBA  FLEXTOUCH) 100 UNIT/ML  FlexTouch Pen, Inject 46-48 Units into the skin daily., Disp: , Rfl:    losartan  (COZAAR ) 50 MG tablet, TAKE 1 TABLET BY MOUTH AT BEDTIME, Disp: 90 tablet, Rfl: 2   metFORMIN  (GLUCOPHAGE -XR) 500 MG 24 hr tablet, Take 1 tablet (500 mg total) by mouth at bedtime., Disp: , Rfl:    nitroGLYCERIN  (NITROSTAT ) 0.4 MG SL tablet, Place 1 tablet (0.4 mg total) under the tongue every 5 (five) minutes as needed for chest pain., Disp: 25 tablet, Rfl: 3   ondansetron  (ZOFRAN ) 4 MG tablet, Take 4 mg by mouth daily as needed for nausea or vomiting., Disp: , Rfl:    oxyCODONE -acetaminophen  (PERCOCET/ROXICET) 5-325 MG tablet, Take 0.5-1 tablets by mouth every 6 (six) hours as needed for severe pain., Disp: , Rfl:    pantoprazole  (PROTONIX ) 40 MG tablet, TAKE 1 TABLET BY MOUTH EVERY MORNING, Disp: 60 tablet, Rfl: 0   Semaglutide , 1 MG/DOSE, (OZEMPIC , 1 MG/DOSE,) 4 MG/3ML SOPN, INJECT 1 MG AS DIRECTED ONCE A WEEK., Disp: 3 mL, Rfl: 1   Study - LIBREXIA-STROKE - milvexian 25 mg or placebo tablet (PI-Sethi), Take 1 tablet by mouth 2 (two) times daily. For Investigational Use Only. Bring all bottles back to next visit. Contact [facility] for questions about this medication., Disp: 280 tablet, Rfl: 0   tamsulosin  (FLOMAX ) 0.4 MG CAPS capsule, Take 2 capsules (0.8 mg total) by mouth daily., Disp: 180 capsule, Rfl: 3  Vitals   Vitals:   05/27/24 1500  Weight: 89 kg    Body mass index is 28.18 kg/m.   Physical Exam   Constitutional: Appears well-developed and well-nourished.  Psych: Affect appropriate to situation.  Eyes: No scleral injection.  HENT: No OP obstruction.  Head: Normocephalic.  Cardiovascular: Normal rate and regular rhythm.  Respiratory: Effort normal, non-labored breathing.  GI: Soft.  No distension. There is no tenderness.  Skin: WDI.   Neurologic Examination  Awake alert  in no distress No dysarthria No aphasia Cranial nerves II to XII: Pupils equal round react light,  extraocular movements intact, visual fields full, face symmetric, facial sensation somewhat diminished on the left in comparison to the right on light touch, tongue and palate midline. Motor examination with drift in the left upper and lower extremity.  No drift on the right Sensation diminished on the left in comparison to the right Coordination examination with no ataxia disproportionate to weakness  Labs/Imaging/Neurodiagnostic studies   CBC:  Recent Labs  Lab Jun 05, 2024 1509 05-Jun-2024 1513  WBC 9.5  --   NEUTROABS 7.0  --   HGB 14.1 13.9  HCT 42.0 41.0  MCV 89.4  --   PLT 228  --    Basic Metabolic Panel:  Lab Results  Component Value Date   NA 142 06/05/2024   K 4.1 Jun 05, 2024   CO2 27 05/11/2024   GLUCOSE 74 Jun 05, 2024   BUN 25 (H) 2024-06-05   CREATININE 1.60 (H) 06/05/24   CALCIUM  8.9 05/11/2024   GFRNONAA >60 01/06/2024   GFRAA 42 (L) 05/05/2019   Lipid Panel:  Lab Results  Component Value Date   LDLCALC 47 05/11/2024   HgbA1c:  Lab Results  Component Value Date   HGBA1C 7.2 (H) 05/11/2024   Urine Drug Screen:     Component Value Date/Time   LABOPIA NONE DETECTED 01/05/2024 1130   COCAINSCRNUR NONE DETECTED 01/05/2024 1130   LABBENZ NONE DETECTED 01/05/2024 1130   AMPHETMU NONE DETECTED 01/05/2024 1130   THCU NONE DETECTED 01/05/2024 1130   LABBARB NONE DETECTED 01/05/2024 1130    Alcohol Level     Component Value Date/Time   [facility] <15 01/04/2024 0857   INR  Lab Results  Component Value Date   INR 1.1 01/04/2024   APTT  Lab Results  Component Value Date   APTT 23 (L) 01/04/2024    CT Head without contrast(Personally reviewed): No acute findings, no bleed  CT angio Head and Neck with contrast(Personally reviewed): Not performed-exam not consistent with the LVO  MRI Brain(Personally reviewed): Negative for acute stroke   ASSESSMENT   Tony Manning is a 63 y.o. male prior left posterior circulation stroke in which she had  left-sided symptoms which resolved with no residual deficits,Librexia stroke trial participant on the study medication Milvexian (factor XI inhibitor) versus placebo-who presents for left-sided weakness of sudden onset while working outside in the heat. New stroke versus recrudescence of old stroke symptoms is in the differentials Taken for stat MRI which was negative for acute stroke  Impression: Likely recrudescence of old stroke symptoms in the setting of stressors such as working out in [employer].  RECOMMENDATIONS  At this time, I would check his labs to make sure he has no electrolyte imbalances and rectify any of those. Continue the statin medication as well as Plavix . Other than that, from a inpatient stroke workup-I do not see a need for admitting him or doing any further workup. He should follow-up with neurology as planned outpatient. Plan was relayed to Dr. Tegeler ______________________________________________________________________    Signed, Eligio Lav, MD [facility]

## 2024-05-29 ENCOUNTER — Ambulatory Visit (INDEPENDENT_AMBULATORY_CARE_PROVIDER_SITE_OTHER): Admitting: Family Medicine

## 2024-05-29 ENCOUNTER — Inpatient Hospital Stay: Admission: RE | Admit: 2024-05-29 | Source: Ambulatory Visit

## 2024-05-29 ENCOUNTER — Encounter: Payer: Self-pay | Admitting: Family Medicine

## 2024-05-29 VITALS — BP 144/74 | HR 69 | Temp 98.1°F | Ht 69.96 in | Wt 197.8 lb

## 2024-05-29 DIAGNOSIS — S060X0A Concussion without loss of consciousness, initial encounter: Secondary | ICD-10-CM | POA: Diagnosis not present

## 2024-05-29 NOTE — Patient Instructions (Addendum)
 CT head today.   If you have new symptoms then let us  know/get rechecked.  Tylenol  for headache, zofran  for nausea.  Rest and fluids in the meantime.  Limit activity and noise/light exposure until improving.  Update us  as needed.  Out of work for now.  Take care.  Glad to see you.

## 2024-05-29 NOTE — Progress Notes (Signed)
 05/27/24, had vertigo sx then weakness.  To ER with CVA like sx.  No new CVA on imaging.  He was discharged home.    Then today he was painting at home. Stepped off stool and hit the back of his head when he fell.  He had HA already but then had photophobia and nausea. No LOC.  He had balance changes at baseline, increased since the fall.  On plavix  at baseline.   Daughter brought patient to OV.    Meds, vitals, and allergies reviewed.   ROS: Per HPI unless specifically indicated in ROS section   Nad Ncat No scalp laceration or bruising.  Neck supple no LA Rrr Ctab Abd soft, not ttp Skin well perfused.  Photophobia.  A&Ox3.  CN 2-12 wnl B, S/S grossly wnl x4  30 minutes were devoted to patient care in this encounter (this includes time spent reviewing the patient's file/history, interviewing and examining the patient, counseling/reviewing plan with patient).

## 2024-05-29 NOTE — Assessment & Plan Note (Signed)
 Concern for concussion since last imaging, with HA/photophobia/nausea.  On plavix .  Check CT head today. Supportive care if CT neg.  See AVS.  He agrees with plan.

## 2024-05-30 ENCOUNTER — Telehealth: Payer: Self-pay | Admitting: Family Medicine

## 2024-05-30 ENCOUNTER — Ambulatory Visit
Admission: RE | Admit: 2024-05-30 | Discharge: 2024-05-30 | Disposition: A | Discharge status: Home / Self Care | Source: Ambulatory Visit | Attending: Family Medicine | Admitting: Family Medicine

## 2024-05-30 DIAGNOSIS — R42 Dizziness and giddiness: Secondary | ICD-10-CM | POA: Diagnosis not present

## 2024-05-30 DIAGNOSIS — S060X0A Concussion without loss of consciousness, initial encounter: Secondary | ICD-10-CM

## 2024-05-30 NOTE — Telephone Encounter (Unsigned)
 Copied from CRM #[id number]. Topic: Clinical - Medication Refill >> May 30, 2024  2:53 PM Robinson H wrote: Medication: Needles for the insulin  degludec (TRESIBA  FLEXTOUCH) 100 UNIT/ML FlexTouch pen  Has the patient contacted their pharmacy? No, hasn't had to order any in a long time (Agent: If no, request that the patient contact the pharmacy for the refill. If patient does not wish to contact the pharmacy document the reason why and proceed with request.) (Agent: If yes, when and what did the pharmacy advise?)  This is the patient's preferred pharmacy:  [id number], [place] - [address] [place] [address] Phone: [phone] Fax: [phone]  Is this the correct pharmacy for this prescription? Yes If no, delete pharmacy and type the correct one.   Has the prescription been filled recently? No  Is the patient out of the medication? Yes  Has the patient been seen for an appointment in the last year OR does the patient have an upcoming appointment? Yes  Can we respond through MyChart? Yes  Agent: Please be advised that Rx refills may take up to 3 business days. We ask that you follow-up with your pharmacy.

## 2024-05-30 NOTE — Telephone Encounter (Signed)
 Is this something you order with PAP program

## 2024-05-30 NOTE — Telephone Encounter (Signed)
 Unable to pend Rx refill for needles, d/t no order in current med list

## 2024-05-31 ENCOUNTER — Other Ambulatory Visit: Payer: Self-pay

## 2024-05-31 ENCOUNTER — Ambulatory Visit: Payer: Self-pay | Admitting: Family Medicine

## 2024-05-31 DIAGNOSIS — Z794 Long term (current) use of insulin: Secondary | ICD-10-CM

## 2024-05-31 MED ORDER — TRESIBA FLEXTOUCH 100 UNIT/ML ~~LOC~~ SOPN: 46.0000 [IU] | PEN_INJECTOR | Freq: Every day | SUBCUTANEOUS | 1 refills | Status: DC

## 2024-05-31 NOTE — Telephone Encounter (Signed)
Refill request has been sent.

## 2024-06-02 NOTE — Progress Notes (Signed)
 Tony Manning                                          MRN: [id number]   06/02/2024   The [facility] Quality Team Specialist reviewed this patient medical record for the purposes of chart review for care gap closure. The following were reviewed: abstraction for care gap closure-kidney health evaluation for diabetes:eGFR  and uACR.    [facility] Quality Team

## 2024-06-05 ENCOUNTER — Telehealth: Payer: Self-pay | Admitting: Family Medicine

## 2024-06-05 ENCOUNTER — Other Ambulatory Visit

## 2024-06-05 DIAGNOSIS — R7989 Other specified abnormal findings of blood chemistry: Secondary | ICD-10-CM

## 2024-06-05 NOTE — Progress Notes (Signed)
 Remote Loop Recorder Transmission

## 2024-06-05 NOTE — Telephone Encounter (Signed)
 D/w pt, gradual improvement with less photophobia.  D/w pt about potential return to work on 06/12/24, assuming continued improvement. Letter done for patient.

## 2024-06-06 ENCOUNTER — Ambulatory Visit: Admitting: Family Medicine

## 2024-06-06 DIAGNOSIS — M65321 Trigger finger, right index finger: Secondary | ICD-10-CM | POA: Diagnosis not present

## 2024-06-06 LAB — BASIC METABOLIC PANEL WITH GFR
BUN: 23 mg/dL (ref 6–23)
CO2: 26 meq/L (ref 19–32)
Calcium: 9.2 mg/dL (ref 8.4–10.5)
Chloride: 106 meq/L (ref 96–112)
Creatinine, Ser: 2.18 mg/dL — ABNORMAL HIGH (ref 0.40–1.50)
GFR: 31.27 mL/min — ABNORMAL LOW (ref >60.00)
Glucose, Bld: 126 mg/dL — ABNORMAL HIGH (ref 70–99)
Potassium: 4.6 meq/L (ref 3.5–5.1)
Sodium: 139 meq/L (ref 135–145)

## 2024-06-07 ENCOUNTER — Encounter: Payer: Self-pay | Admitting: Family Medicine

## 2024-06-08 ENCOUNTER — Other Ambulatory Visit: Payer: Self-pay | Admitting: Family Medicine

## 2024-06-08 ENCOUNTER — Ambulatory Visit: Payer: Self-pay

## 2024-06-08 ENCOUNTER — Ambulatory Visit: Payer: Self-pay | Admitting: Family Medicine

## 2024-06-08 DIAGNOSIS — R7989 Other specified abnormal findings of blood chemistry: Secondary | ICD-10-CM

## 2024-06-08 NOTE — Telephone Encounter (Signed)
Noted. Thanks.  Will d/w pt at OV.  

## 2024-06-08 NOTE — Telephone Encounter (Signed)
 Patient called in to report low blood glucose. Patient's blood glucose was 53 while on the phone with this RN. Patient denied symptoms at time of call. This RN advised urgent home treatment with follow-up call. This RN advised patient to eat a snack and drink some fruit juice. Patient verbalized understanding and stated he would do so after the call. This RN advised that patient would be called back by a nurse within 30 minutes to assess blood glucose and symptoms. Patient agreed to call back before then if symptoms arise.   Copied from CRM [id number]. Topic: Clinical - Red Word Triage >> Jun 08, 2024 12:18 PM Dedra B wrote: Red Word that prompted transfer to Nurse Triage: Pt having trouble keeping his blood sugar up. It was 173  then dropped back down to 64. Warm transfer to NT. Reason for Disposition  [1] Low blood glucose (70 mg/dL [6.0 mmol/L] or below)) with no other adult present AND [2] hasn't tried Care Advice  Answer Assessment - Initial Assessment Questions 1. SYMPTOMS: What symptoms are you concerned about?     Weakness this morning that has gone away, denies symptoms at this current time 2. ONSET:  When did the symptoms start?     This morning, happened a few days ago as well 3. BLOOD GLUCOSE: What is your blood glucose level?      60s this morning, ate a meal and went up to 173 and dropped back down to 64 shortly after eating, 53 while on phone with this RN 4. USUAL RANGE: What is your blood glucose level usually? (e.g., usual fasting morning value, usual evening value)     140s 5. TYPE 1 or 2:  Do you know what type of diabetes you have?  (e.g., Type 1, Type 2, Gestational; doesn't know)      Type 2 6. INSULIN : Do you take insulin ? What type of insulin (s) do you use? What is the mode of delivery? (syringe, pen; injection or pump) When did you last give yourself an insulin  dose? (i.e., time or hours/minutes ago) How much did you give? (i.e., how many units)     Tresiba    7. DIABETES PILLS: Do you take any pills for your diabetes? If Yes, ask: What is the name of the medicine(s) that you take for high blood sugar?     Metformin  8. OTHER SYMPTOMS: Do you have any symptoms? (e.g., fever, frequent urination, difficulty breathing, vomiting)     Denies lightheadedness/denies dizziness, denies additional symptoms at this time  9. LOW BLOOD GLUCOSE TREATMENT: What have you done so far to treat the low blood glucose level?     Ate a snack/meal earlier today 10. FOOD: When did you last eat or drink?     1-2 hours ago  Protocols used: Diabetes - Low Blood Sugar-A-AH

## 2024-06-08 NOTE — Telephone Encounter (Signed)
 This RN called patient for blood sugar check. Patient stated blood sugar was at 94 in the last 15 minutes, and says its 66 and stable right now. Ate cheese sticks and a cupcake.  Unsure if sensor is having an issue, does not have glucose monitor or lancets to check in finger. Denies any symptoms. Mentioned kidney issues. Scheduled patient an appointment.

## 2024-06-09 ENCOUNTER — Ambulatory Visit: Admitting: Family Medicine

## 2024-06-09 ENCOUNTER — Encounter: Payer: Self-pay | Admitting: Family Medicine

## 2024-06-09 ENCOUNTER — Other Ambulatory Visit: Payer: Self-pay

## 2024-06-09 VITALS — BP 134/62 | HR 65 | Temp 98.0°F | Ht 69.96 in | Wt 199.8 lb

## 2024-06-09 DIAGNOSIS — R7989 Other specified abnormal findings of blood chemistry: Secondary | ICD-10-CM | POA: Diagnosis not present

## 2024-06-09 DIAGNOSIS — E1149 Type 2 diabetes mellitus with other diabetic neurological complication: Secondary | ICD-10-CM

## 2024-06-09 DIAGNOSIS — Z794 Long term (current) use of insulin: Secondary | ICD-10-CM

## 2024-06-09 NOTE — Patient Instructions (Signed)
 Visit Information  Thank you for taking time to visit with me today. Please don't hesitate to contact me if I can be of assistance to you before our next scheduled appointment.  Our next appointment is by telephone on 07/07/24 at 2:00 pm Please call the care guide team at [phone] if you need to cancel or reschedule your appointment.   Following is a copy of your care plan:   Goals Addressed             This Visit's Progress    VBCI RN Care Plan- Hypertension/CKD   On track    Problems:  Chronic Disease Management support and education needs related to CKD Stage   and HTN  Goal: Over the next 3 months the Patient will attend all scheduled medical appointments: with providers as evidenced by patient report/ chart review.         continue to work with Medical illustrator and/or Social Worker to address care management and care coordination needs related to HTN as evidenced by adherence to CM Team Scheduled appointments     demonstrate Ongoing adherence to prescribed treatment plan for HTN as evidenced by patient report/ chart review.  take all medications exactly as prescribed and will call provider for medication related questions as evidenced by patient report / chart review.     Patient blood pressure will range within provider established parameters.  Patient will monitor blood pressure at least 2-3 times per week and record.  Interventions:   Hypertension Interventions: Last practice recorded BP readings:  BP Readings from Last 3 Encounters:  06/09/24 134/62  05/29/24 (!) 144/74  05/27/24 136/68   Most recent eGFR/CrCl: No results found for: EGFR  No components found for: CRCL  Evaluation of current treatment plan related to hypertension self management and patient's adherence to plan as established by provider Reviewed medications with patient and discussed importance of compliance Discussed plans with patient for ongoing care management follow up and provided patient  with direct contact information for care management team Advised patient, providing education and rationale, to monitor blood pressure daily and record, calling PCP for findings outside established parameters Reviewed scheduled/upcoming provider appointments including:   CKD Self Care Monitor and promptly report sx of UTI/Kidney Stones Monitor and record BP at least 3x week and bring to appointments Hydrate with water Keep BG within established parameters, Z1C goal <7.0 Avoid NSAIDS Incorporate renal diet guidelines Education on CKD and HTN  Patient Self-Care Activities:  Attend all scheduled provider appointments Call pharmacy for medication refills 3-7 days in advance of running out of medications Call provider office for new concerns or questions  Take medications as prescribed   check blood pressure 3 times per week choose a place to take my blood pressure (home, clinic or office, retail store) write blood pressure results in a log or diary take blood pressure log to all doctor appointments call doctor for signs and symptoms of high blood pressure keep all doctor appointments take medications for blood pressure exactly as prescribed  Plan:  Telephone follow up appointment with care management team member scheduled for:  07/07/24 at 2 pm          VBCI RN Care Plan-diabetes   On track    Problems:  Chronic Disease Management support and education needs related to DMII Knowledge Deficits related to DMII  Goal: Over the next 3 months the Patient will attend all scheduled medical appointments: with providers as evidenced by patient report/ chart review.  continue to work with Medical illustrator and/or Social Worker to address care management and care coordination needs related to DMII as evidenced by adherence to CM Team Scheduled appointments     demonstrate Ongoing adherence to prescribed treatment plan for DMII as evidenced by patient report/ chart review.  take all  medications exactly as prescribed and will call provider for medication related questions as evidenced by patient report/ chart review.     verbalize understanding of plan for management of DMII as evidenced by patient report/ chart review.  Patient will report decrease in hypoglycemic events.   Interventions:   Diabetes Interventions: Assessed patient's understanding of A1c goal: <7% Provided education to patient about basic DM disease process Reviewed medications with patient and discussed importance of medication adherence Discussed plans with patient for ongoing care management follow up and provided patient with direct contact information for care management team Reviewed scheduled/upcoming provider appointments including:   Advised patient, providing education and rationale, to check cbg readings and record, calling primary care provider. for findings outside established parameters Assessed social determinant of health barriers Discussed Rule of 15 for hypoglycemic management Discussed plate method for diabetic diet management. Advised to decrease/ limit high carbohydrate foods, sweetened drinks Lab Results  Component Value Date   HGBA1C 7.2 (H) 05/11/2024    Patient Self-Care Activities:  Attend all scheduled provider appointments Call pharmacy for medication refills 3-7 days in advance of running out of medications Call provider office for new concerns or questions  Take medications as prescribed   check blood sugar at prescribed times: before meals and at bedtime and when you have symptoms of low or high blood sugar check feet daily for cuts, sores or redness take the blood sugar log to all doctor visits fill half of plate with vegetables manage portion size  Plan:  Telephone follow up appointment with care management team member scheduled for:  07/07/24 at 2 pm             Please call the Suicide and Crisis Lifeline: 988 call the [place]  National Suicide Prevention  Lifeline: 6616502059 or TTY: 670-541-7723 TTY 630-438-7967) to talk to a trained counselor call 1-800-273-TALK (toll free, 24 hour hotline) go to [facility] [address] [place] [phone]) call 911 if you are experiencing a Mental Health or Behavioral Health Crisis or need someone to talk to.  Patient verbalizes understanding of instructions and care plan provided today and agrees to view in MyChart. Active MyChart status and patient understanding of how to access instructions and care plan via MyChart confirmed with patient.     Nestora Duos, MSN, RN [place]  [facility], [facility] RN Care Manager Direct Dial: [phone] Fax: [phone]  Chronic Kidney Disease in Adults: What to Know Chronic kidney disease (CKD) is when lasting damage happens to the kidneys slowly over time. The kidneys are two organs that do many important things in the body. These include: Taking waste and extra fluid out of the blood to make pee (urine). Making hormones. Keeping the right amount of fluids and chemicals in the body. A small amount of kidney damage may not cause problems. You must take steps to help keep the kidney damage from getting worse. A lot of damage may cause kidney failure. Kidney failure means the kidneys can no longer work right. What are the causes? Diabetes. High blood pressure. Diseases that affect the heart and blood vessels. Other kidney diseases. Diseases that affect the body's  defense system (immune system). A problem with the flow of pee. This may be caused by: Kidney stones. Cancer. An enlarged prostate, in males. A kidney infection or urinary tract infection (UTI) that keeps coming back. What increases the risk? Getting older. The chances of having CKD increase with age. A family history of kidney disease or kidney failure. Having a disease caused by genes. Taking medicines that can harm  the kidneys. Being near or having contact with harmful substances. Being very overweight. Using tobacco now or in the past. What are the signs or symptoms? Common symptoms of CKD include: Feeling very tired and having less energy. Swelling of the face, legs, ankles, or feet. Throwing up or feeling like you may throw up. Not wanting to eat as much as normal. Being confused or not able to focus. Twitches and cramps in the leg muscles or other muscles. Dry, itchy skin. Other symptoms may include: Shortness of breath. Trouble sleeping. Making less pee, or making more pee, especially at night. A taste of metal in your mouth. You may also become anemic. Anemia means there's not enough red blood cells in your blood. You may get symptoms slowly. You may not notice them until the kidney damage gets very bad. How is this diagnosed? CKD may be diagnosed based on: Tests on your blood or pee. Imaging tests, like an ultrasound or a CT scan. A kidney biopsy. For this test, a sample of kidney tissue is removed to be looked at under a microscope. These tests will help to find out how serious the CKD is. How is this treated? Often, there's no cure for CKD. Treatment can help with symptoms and help keep the disease from getting worse. Treatment may include: Treating other problems that are causing your CKD or making it worse. Diet changes. You may need to: Avoid alcohol. Avoid foods that are high in salt, potassium, phosphorous, and protein. Taking medicines for symptoms and to help control other conditions. Dialysis. This treatment gets harmful waste out of your body. It may be needed if you have kidney failure. Follow these instructions at home: Medicines Take your medicines only as told. The amount of some medicines you take may need to be changed. Do not take any new medicines, vitamins, or supplements unless your health care provider says it's okay. These may make kidney damage  worse. Lifestyle Do not smoke, vape, or use nicotine or tobacco. If you drink alcohol: Limit how much you have to: 0-1 drink a day if you're male. 0-2 drinks a day if you're male. Know how much alcohol is in your drink. In the [place] one drink is one 12 oz bottle of beer (355 mL), one 5 oz glass of wine (148 mL), or one 1 oz glass of hard liquor (44 mL). Stay at a healthy weight. If you need help, ask your provider. General instructions  Eat and drink as told. Track your blood pressure at home. Tell your provider about any changes. If you have diabetes, track your blood sugar as told. Exercise at least 30 minutes a day, 5 days a week. Keep your shots (vaccinations) up to date. Keep all follow-up visits. Your provider may need to change your treatments over time. Where to find support American Kidney Fund: [place].com.au Kidney School: kidneyschool.org American Association of Kidney Patients: https://www.miller-montoya.com/ Where to find more information National Kidney Foundation: kidney.org Centers for Disease Control and Prevention. To learn more: Go to DiningCalendar.de. Click Search. Type chronic kidney disease in the search box.  Contact a health care provider if: You have new symptoms. You get symptoms of end-stage kidney disease. These include: Headaches. Numbness in your hands or feet. Leg cramps. Easy bruising. Get help right away if: You have a fever. You make less pee than usual. You have pain or bleeding when you pee or poop. You have chest pain. You have shortness of breath. These symptoms may be an emergency. Call 911 right away. Do not wait to see if the symptoms will go away. Do not drive yourself to the hospital. This information is not intended to replace advice given to you by your health care provider. Make sure you discuss any questions you have with your health care provider. Document Revised: 07/06/2023 Document Reviewed: 02/26/2023 Elsevier Patient Education  2024 Elsevier  Inc.   Warning Signs of a Stroke: What to Know A stroke happens when there's less blood flow to part of your brain. This keeps your brain from getting enough oxygen. It can lead to a brain injury. A stroke is an emergency and should be treated right away. You're more likely to get better after a stroke if you get help right away. It's very important to know the symptoms of a stroke. What types of strokes are there? There are 2 main types of stroke: Ischemic stroke. This is the most common type. It happens when a blood vessel that sends blood to the brain is blocked. Hemorrhagic stroke. This happens when there's bleeding in your brain. This may be from a blood vessel leaking or bursting. A transient ischemic attack (TIA) causes the same symptoms as a stroke. But the symptoms go away quickly and don't cause lasting damage to your brain. TIAs need to be treated right away. Having a TIA is a sign that you are more at risk for having a stroke in the future. What are the warning signs of a stroke? The symptoms of a stroke may differ based on where it is in your brain. Symptoms often happen all of a sudden. BE FAST symptoms BE FAST is an easy way to remember the main warning signs: B - Balance. Feeling dizzy, sudden trouble walking, or loss of balance. E - Eyes. Trouble seeing or a change in how you see. F - Face. Sudden weakness or feeling numb in the face. The face or eyelid may droop on one side. A - Arms. Weakness or loss of feeling in an arm. This happens fast and often only on one side. ILENE - Speech. Sudden trouble speaking, slurred speech, or trouble understanding what people say. T - Time. Time to call 911. Write down what time symptoms started. You may be having a stroke even if you only have one BE FAST symptom. Other signs of a stroke Other signs of a stroke may be: A sudden, very bad headache with no known cause. Feeling like you may throw up. Throwing up. These symptoms may be  an emergency. Call 911 right away. Do not wait to see if the symptoms will go away. Do not drive yourself to the hospital.  This information is not intended to replace advice given to you by your health care provider. Make sure you discuss any questions you have with your health care provider. Document Revised: 12/16/2023 Document Reviewed: 12/16/2023 Elsevier Patient Education  2025 [employer].  Managing Your Hypertension Hypertension, also called high blood pressure, is when the force of the blood pressing against the walls of the arteries is too strong. Arteries are blood vessels that  carry blood from your heart throughout your body. Hypertension forces the heart to work harder to pump blood and may cause the arteries to become narrow or stiff. Understanding blood pressure readings A blood pressure reading includes a higher number over a lower number: The first, or top, number is called the systolic pressure. It is a measure of the pressure in your arteries as your heart beats. The second, or bottom number, is called the diastolic pressure. It is a measure of the pressure in your arteries as the heart relaxes. For most people, a normal blood pressure is below 120/80. Your personal target blood pressure may vary depending on your medical conditions, your age, and other factors. Blood pressure is classified into four stages. Based on your blood pressure reading, your health care provider may use the following stages to determine what type of treatment you need, if any. Systolic pressure and diastolic pressure are measured in a unit called millimeters of mercury (mmHg). Normal Systolic pressure: below 120. Diastolic pressure: below 80. Elevated Systolic pressure: 120-129. Diastolic pressure: below 80. Hypertension stage 1 Systolic pressure: 130-139. Diastolic pressure: 80-89. Hypertension stage 2 Systolic pressure: 140 or above. Diastolic pressure: 90 or above. How can this condition  affect me? Managing your hypertension is very important. Over time, hypertension can damage the arteries and decrease blood flow to parts of the body, including the brain, heart, and kidneys. Having untreated or uncontrolled hypertension can lead to: A heart attack. A stroke. A weakened blood vessel (aneurysm). Heart failure. Kidney damage. Eye damage. Memory and concentration problems. Vascular dementia. What actions can I take to manage this condition? Hypertension can be managed by making lifestyle changes and possibly by taking medicines. Your health care provider will help you make a plan to bring your blood pressure within a normal range. You may be referred for counseling on a healthy diet and physical activity. Nutrition  Eat a diet that is high in fiber and potassium, and low in salt (sodium), added sugar, and fat. An example eating plan is called the DASH diet. DASH stands for Dietary Approaches to Stop Hypertension. To eat this way: Eat plenty of fresh fruits and vegetables. Try to fill one-half of your plate at each meal with fruits and vegetables. Eat whole grains, such as whole-wheat pasta, brown rice, or whole-grain bread. Fill about one-fourth of your plate with whole grains. Eat low-fat dairy products. Avoid fatty cuts of meat, processed or cured meats, and poultry with skin. Fill about one-fourth of your plate with lean proteins such as fish, chicken without skin, beans, eggs, and tofu. Avoid pre-made and processed foods. These tend to be higher in sodium, added sugar, and fat. Reduce your daily sodium intake. Many people with hypertension should eat less than 1,500 mg of sodium a day. Lifestyle  Work with your health care provider to maintain a healthy body weight or to lose weight. Ask what an ideal weight is for you. Get at least 30 minutes of exercise that causes your heart to beat faster (aerobic exercise) most days of the week. Activities may include walking, swimming,  or biking. Include exercise to strengthen your muscles (resistance exercise), such as weight lifting, as part of your weekly exercise routine. Try to do these types of exercises for 30 minutes at least 3 days a week. Do not use any products that contain nicotine or tobacco. These products include cigarettes, chewing tobacco, and vaping devices, such as e-cigarettes. If you need help quitting, ask your health care  provider. Control any long-term (chronic) conditions you have, such as high cholesterol or diabetes. Identify your sources of stress and find ways to manage stress. This may include meditation, deep breathing, or making time for fun activities. Alcohol use Do not drink alcohol if: Your health care provider tells you not to drink. You are pregnant, may be pregnant, or are planning to become pregnant. If you drink alcohol: Limit how much you have to: 0-1 drink a day for women. 0-2 drinks a day for men. Know how much alcohol is in your drink. In the [place] one drink equals one 12 oz bottle of beer (355 mL), one 5 oz glass of wine (148 mL), or one 1 oz glass of hard liquor (44 mL). Medicines Your health care provider may prescribe medicine if lifestyle changes are not enough to get your blood pressure under control and if: Your systolic blood pressure is 130 or higher. Your diastolic blood pressure is 80 or higher. Take medicines only as told by your health care provider. Follow the directions carefully. Blood pressure medicines must be taken as told by your health care provider. The medicine does not work as well when you skip doses. Skipping doses also puts you at risk for problems. Monitoring Before you monitor your blood pressure: Do not smoke, drink caffeinated beverages, or exercise within 30 minutes before taking a measurement. Use the bathroom and empty your bladder (urinate). Sit quietly for at least 5 minutes before taking measurements. Monitor your blood pressure at home as  told by your health care provider. To do this: Sit with your back straight and supported. Place your feet flat on the floor. Do not cross your legs. Support your arm on a flat surface, such as a table. Make sure your upper arm is at heart level. Each time you measure, take two or three readings one minute apart and record the results. You may also need to have your blood pressure checked regularly by your health care provider. General information Talk with your health care provider about your diet, exercise habits, and other lifestyle factors that may be contributing to hypertension. Review all the medicines you take with your health care provider because there may be side effects or interactions. Keep all follow-up visits. Your health care provider can help you create and adjust your plan for managing your high blood pressure. Where to find more information National Heart, Lung, and Blood Institute: PopSteam.is American Heart Association: www.heart.org Contact a health care provider if: You think you are having a reaction to medicines you have taken. You have repeated (recurrent) headaches. You feel dizzy. You have swelling in your ankles. You have trouble with your vision. Get help right away if: You develop a severe headache or confusion. You have unusual weakness or numbness, or you feel faint. You have severe pain in your chest or abdomen. You vomit repeatedly. You have trouble breathing. These symptoms may be an emergency. Get help right away. Call 911. Do not wait to see if the symptoms will go away. Do not drive yourself to the hospital. Summary Hypertension is when the force of blood pumping through your arteries is too strong. If this condition is not controlled, it may put you at risk for serious complications. Your personal target blood pressure may vary depending on your medical conditions, your age, and other factors. For most people, a normal blood pressure is less  than 120/80. Hypertension is managed by lifestyle changes, medicines, or both. Lifestyle changes to help manage  hypertension include losing weight, eating a healthy, low-sodium diet, exercising more, stopping smoking, and limiting alcohol. This information is not intended to replace advice given to you by your health care provider. Make sure you discuss any questions you have with your health care provider. Document Revised: 05/08/2021 Document Reviewed: 05/08/2021 Elsevier Patient Education  2024 Elsevier Inc.   Diabetes: Self-Care  Diabetes mellitus, or diabetes, is a long-term (chronic) disease. It happens when your body doesn't properly use the sugar, or glucose, that's released from food after you eat. Diabetes may happen if: Your pancreas doesn't make enough of a hormone called insulin . Your body doesn't react like it should to the insulin  that it makes. Insulin  lets sugar go into the cells in your body. This gives you energy. If you have diabetes, the sugar can't get into the cells. This causes high blood sugar. How to treat and manage diabetes You may need to take insulin  or other medicines each day to keep your blood sugar in balance. If you need to take insulin , you'll learn how to give it to yourself as a shot. You may need to change the amount of insulin  you take based on the foods you eat. You'll need to check your blood sugar levels with a glucose monitor. The readings will show if you have low or high blood sugar. In general, you should have these blood sugar levels: Before meals: 80-130 mg/dL (4.4-7.2 mmol/L). After meals: below 180 mg/dL (10 mmol/L). Hemoglobin A1c (HbA1c) level: less than 7%. Your health care provider will set treatment goals for you. Keep all follow-up visits. Your provider will watch you closely to make sure treatment is working. Follow these instructions at home: Diabetes medicines Take your medicines every day as told by your provider. List your  medicines here: Name of medicine: ______________________________ Amount (dose): _______________ Time (a.m./p.m.): _______________ Notes: ___________________________________ Name of medicine: ______________________________ Amount (dose): _______________ Time (a.m./p.m.): _______________ Notes: ___________________________________ Name of medicine: ______________________________ Amount (dose): _______________ Time (a.m./p.m.): _______________ Notes: ___________________________________ Insulin  If you use insulin , list the types of insulin  you use here: Insulin  type: ______________________________ Amount (dose): _______________ Time (a.m./p.m.): _______________Notes: ___________________________________ Insulin  type: ______________________________ Amount (dose): _______________ Time (a.m./p.m.): _______________ Notes: ___________________________________ Insulin  type: ______________________________ Amount (dose): _______________ Time (a.m./p.m.): _______________ Notes: ___________________________________ Insulin  type: ______________________________ Amount (dose): _______________ Time (a.m./p.m.): _______________ Notes: ___________________________________ Insulin  type: ______________________________ Amount (dose): _______________ Time (a.m./p.m.): _______________ Notes: ___________________________________ Managing blood sugar  Check your blood sugar levels with a glucose monitor as told by your provider. Write down the times that you check your levels here: Time: _______________ Notes: ___________________________________ Time: _______________ Notes: ___________________________________ Time: _______________ Notes: ___________________________________ Time: _______________ Notes: ___________________________________ Time: _______________ Notes: ___________________________________ Time: _______________ Notes: ___________________________________  Low blood sugar Low blood sugar is when the amount of  sugar in your blood is at or below 70 mg/dL (3.9 mmol/L). Symptoms may include: Feeling: Hungry. Sweaty and clammy. Irritable or easily upset. Dizzy. Sleepy. Having: A fast heartbeat. A headache. A change in your eyesight. Numbness around your mouth, lips, or tongue. Having trouble with: Moving your body, or coordination. Sleeping. Treating low blood sugar If you have low blood sugar, eat or drink something with sugar in it right away. If you can think clearly and swallow safely, follow the 15:15 rule: Take 15 gramsof a fast-acting carbohydrate (carb). Options include: 4 oz (120 mL) of fruit juice. 4 oz (120 mL) of soda (not diet soda). A few pieces of hard candy. Check food labels to see how many pieces to  eat. 1 Tbsp (15 mL) of sugar or honey. 4 glucose tablets. 1 tube of glucose gel. Check your blood sugar 15 minutes after you take the carb. If your blood sugar is still at or below 70 mg/dL (3.9 mmol/L), take 15 grams of a carb again. If your blood sugar doesn't go above 70 mg/dL (3.9 mmol/L) after 3 tries, get help right away. After your blood sugar goes back to normal, eat a meal or a snack within 1 hour. Treating very low blood sugar If your blood sugar is less than 54 mg/dL (3 mmol/L), it's an emergency. Get help right away. If you can't eat or drink, you will need to be given glucagon. A family member or friend should learn how to check your blood sugar and give you glucagon. Ask your provider if you should keep a glucagon kit at home. You may also need to be treated in a hospital. Questions to ask your health care provider Should I talk with a diabetes educator? What tools will I need to care for myself at home? What medicines do I need? When should I take them? How often do I need to check my blood sugar levels? What number can I call if I have questions? When is my follow-up visit? Where can I find a support group for people with diabetes? Where to find more  information American Diabetes Association (ADA): diabetes.org Association of Diabetes Care and Education Specialists: diabeteseducator.org Contact a health care provider if: Your blood sugar is at or above 240 mg/dL (86.6 mmol/L) for 2 days in a row. You've been sick or have had a fever for 2 days or more, and you're not getting better. For more than 6 hours: You can't eat or drink. You feel nauseous. You throw up. You have diarrhea. Get help right away if: Your blood sugar is below 54 mg/dL (3 mmol/L). You get confused. You have trouble thinking clearly. You have trouble breathing. These symptoms may be an emergency. Get help right away. Call 911. Do not wait to see if the symptoms will go away. Do not drive yourself to the hospital. This information is not intended to replace advice given to you by your health care provider. Make sure you discuss any questions you have with your health care provider. Document Revised: 01/08/2023 Document Reviewed: 01/08/2023 Elsevier Patient Education  2024 [employer].

## 2024-06-09 NOTE — Patient Instructions (Addendum)
 Stop metformin  for now.  Let me know if you don't get a call about seeing the kidney clinic.   If you have any more trouble with low sugars, then cut back by 2 units per day.   Update me about your situation next week.

## 2024-06-09 NOTE — Progress Notes (Signed)
 Cr elevation.  D/w pt about renal eval.  Referral placed.  Not taking nsaids.  Cautions d/w pt.  D/w pt about stopping metformin .   Lower sugar d/w pt.  He had recurrent low sugars on his meter yesterday, needed a snack mult times.  Then eventually sugar went up to >250 and then gradually normalized.  He is using libre CGM.  He didn't have a way to calibrate his meter.  He had low sugars after insulin  injection yesterday.  This wasn't a new pen.    He had cortisone shot in his hand recently.    He is still dealing with post concussion sx.  Work note updated.  Still having HA and dizzy sensation. He has made some improvement but isn't back to baseline.   Meds, vitals, and allergies reviewed.   ROS: Per HPI unless specifically indicated in ROS section   GEN: nad, alert and oriented HEENT: ncat NECK: supple w/o LA CV: rrr PULM: ctab, no inc wob ABD: soft, +bs EXT: no edema SKIN: well perfused.

## 2024-06-09 NOTE — Patient Outreach (Signed)
 Complex Care Management   Visit Note  06/09/2024  Name:  Tony Manning MRN: [id number] DOB: 1960-11-25  Situation: Referral received for Complex Care Management related to Chronic Kidney Disease and HTN I obtained verbal consent from Patient.  Visit completed with Patient  on the phone  Background:   Past Medical History:  Diagnosis Date   Anginal pain    Basal cell carcinoma of face    Chest pain, unspecified    Chronic kidney disease    kidney stones   Coronary atherosclerosis of native coronary artery    s/p stent x5   Depression    Diabetes mellitus without mention of complication    Diverticulosis    Dyspnea    Dysrhythmia    Erectile dysfunction    Essential hypertension, benign    Family history of adverse reaction to anesthesia    GERD (gastroesophageal reflux disease)    Heart murmur    History of kidney stones    History of nephrolithiasis    Hyperlipidemia    Migraine    Myocardial infarction (HCC)    2016   Neuromuscular disorder (HCC)    Osteoarthritis    Pneumonia    Postsurgical percutaneous transluminal coronary angioplasty status    s/p stent x4   Sarcoidosis    Sleep apnea    uses CPAP   Unspecified sleep apnea    uses C-pap    Assessment: Patient Reported Symptoms:  Cognitive Cognitive Status: Alert and oriented to person, place, and time, Normal speech and language skills, Insightful and able to interpret abstract concepts (reports forgetful his entire life)   Health Maintenance Behaviors: Annual physical exam, Sleep adequate Healing Pattern: Average Health Facilitated by: Rest  Neurological Neurological Review of Symptoms: Numbness Neurological Comment: 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 episode weakness 9/20 L arm, neg stroke, fall, concussion 05/29/24 CT negative still with light and noise sensitivity HA some dizziness - discussed concept of brain rest post concussion  HEENT HEENT Symptoms Reported: No symptoms reported HEENT  Comment: vision - reading glasses,    Cardiovascular Cardiovascular Symptoms Reported: No symptoms reported Does patient have uncontrolled Hypertension?: No Cardiovascular Management Strategies: Medication therapy, Routine screening Weight: 193 lb (87.5 kg) Cardiovascular Comment: Will start checking BP again and recording, discussed relationship to strokes and kidneys, wears CPAP regularly  Respiratory Respiratory Symptoms Reported: Shortness of breath Other Respiratory Symptoms: DOE for many years, energy conservation discussed, states quick recovery with rest, Respiratory Management Strategies: Routine screening, CPAP  Endocrine Endocrine Symptoms Reported: Hypoglycemia, Shakiness, Sweating Is patient diabetic?: Yes Is patient checking blood sugars at home?: Yes List most recent blood sugar readings, include date and time of day: Libre - FBGs saved on phone today 135  - was having lows, could not get BG to stay up few days ago, discussed proper tx hypoglycemia - uses sweet-tarts for fast sugar Endocrine Comment: states provider said might have acidentally injected into capillary vessel ausing difficulty getting BG up  Gastrointestinal Gastrointestinal Symptoms Reported: No symptoms reported Gastrointestinal Management Strategies: Medication therapy    Genitourinary Genitourinary Symptoms Reported: No symptoms reported Additional Genitourinary Details: tamsulosin  - hx kidney stones discussed need to promt eval if sx to avoid AKI    Integumentary Integumentary Symptoms Reported: No symptoms reported    Musculoskeletal Musculoskelatal Symptoms Reviewed: Back pain, Joint pain, Muscle pain Additional Musculoskeletal Details: back and neck pain, right hip pain causes unsteady at times, Musculoskeletal Management Strategies: Medication therapy, Routine screening Falls in the past year?:  Yes (reports 5 falls, all ladder related, recent fall, concussion) Number of falls in past year: 2 or  more Was there an injury with Fall?: Yes Fall Risk Category Calculator: 3 Patient Fall Risk Level: High Fall Risk Patient at Risk for Falls Due to: History of fall(s), Impaired balance/gait (discussed concerns about repeated ladder usage with concussion) Fall risk Follow up: Falls evaluation completed, Falls prevention discussed  Psychosocial Psychosocial Symptoms Reported: No symptoms reported     Quality of Family Relationships: helpful, supportive, involved Do you feel physically threatened by others?: No    06/09/2024    PHQ2-9 Depression Screening   Little interest or pleasure in doing things Several days  Feeling down, depressed, or hopeless Not at all  PHQ-2 - Total Score 1  Trouble falling or staying asleep, or sleeping too much    Feeling tired or having little energy    Poor appetite or overeating     Feeling bad about yourself - or that you are a failure or have let yourself or your family down    Trouble concentrating on things, such as reading the newspaper or watching television    Moving or speaking so slowly that other people could have noticed.  Or the opposite - being so fidgety or restless that you have been moving around a lot more than usual    Thoughts that you would be better off dead, or hurting yourself in some way    PHQ2-9 Total Score    If you checked off any problems, how difficult have these problems made it for you to do your work, take care of things at home, or get along with other people    Depression Interventions/Treatment      There were no vitals filed for this visit.  Medications Reviewed Today     Reviewed by Devra Lands, RN (Registered Nurse) on 06/09/24 at 1522  Med List Status: <None>   Medication Order Taking? Sig Documenting Provider Last Dose Status Informant  amLODipine  (NORVASC ) 5 MG tablet [id number] Yes Take 1 tablet (5 mg total) by mouth daily. Wyn Jackee VEAR Mickey., NP  Active   atorvastatin  (LIPITOR ) 40 MG tablet [id number] Yes  TAKE 1 TABLET BY MOUTH EVERY MORNING Duncan, Graham S, MD  Active   carvedilol  (COREG ) 3.125 MG tablet [id number] Yes TAKE 1 TABLET BY MOUTH TWICE A DAY Cleatus Arlyss RAMAN, MD  Active   Cholecalciferol (VITAMIN D3) 25 MCG (1000 UT) CAPS [id number] Yes Take 1 capsule (1,000 Units total) by mouth daily. Cleatus Arlyss RAMAN, MD  Active   clopidogrel  (PLAVIX ) 75 MG tablet [id number] Yes Take 1 tablet (75 mg total) by mouth daily. Rosemarie Eather RAMAN, MD  Active   Continuous Glucose Sensor (FREESTYLE LIBRE 3 PLUS SENSOR) [place] [id number] Yes USE TO CHECK BLOOD SUGAR CONTINUOUSLY. CHANGE SENSOR EVERY 15 DAYS. Cleatus Arlyss RAMAN, MD  Active   cyclobenzaprine  (FLEXERIL ) 5 MG tablet [id number] Yes TAKE 1 TABLET BY MOUTH EVERYDAY AT BEDTIME  Patient taking differently: As needed   Cleatus Arlyss RAMAN, MD  Active   gabapentin  (NEURONTIN ) 300 MG capsule [id number] Yes TAKE 1-2 CAPSULES BY MOUTH IN THE EVENING. MAY CAUSE SEDATION Cleatus Arlyss RAMAN, MD  Active   insulin  degludec (TRESIBA  FLEXTOUCH) 100 UNIT/ML FlexTouch Pen [id number] Yes Inject 46-48 Units into the skin daily. Cleatus Arlyss RAMAN, MD  Active   losartan  (COZAAR ) 50 MG tablet [id number] Yes TAKE 1 TABLET BY MOUTH AT BEDTIME Cleatus Arlyss RAMAN, MD  Active   nitroGLYCERIN  (  NITROSTAT ) 0.4 MG SL tablet [id number] Yes Place 1 tablet (0.4 mg total) under the tongue every 5 (five) minutes as needed for chest pain. Wyn Jackee VEAR Mickey., NP  Active            Med Note MAYER, ARLYSS GORMAN Kitchens Jan 10, 2024 12:34 PM)    ondansetron  (ZOFRAN ) 4 MG tablet [id number] Yes Take 4 mg by mouth daily as needed for nausea or vomiting. [provider]  Active Self, Pharmacy Records           Med Note MAYER ARLYSS GORMAN Kitchens Jan 10, 2024 12:34 PM)    oxyCODONE -acetaminophen  (PERCOCET/ROXICET) 5-325 MG tablet [id number] Yes Take 0.5-1 tablets by mouth every 6 (six) hours as needed for severe pain. [provider]  Active Self, Pharmacy Records           Med Note MAYER ARLYSS GORMAN Kitchens  Jan 10, 2024 12:34 PM)    pantoprazole  (PROTONIX ) 40 MG tablet [id number] Yes TAKE 1 TABLET BY MOUTH EVERY DAY IN THE MORNING Cleatus ARLYSS GORMAN, MD  Active   Semaglutide , 1 MG/DOSE, (OZEMPIC , 1 MG/DOSE,) 4 MG/3ML SOPN [id number] Yes INJECT 1 MG AS DIRECTED ONCE A WEEK. Cleatus ARLYSS GORMAN, MD  Active Self, Pharmacy Records           Med Note MAYER, ARLYSS GORMAN   Fri Feb 04, 2024  8:19 AM)    Laretta GLENWOOD ARTIS - milvexian 25 mg or placebo tablet (PI-Sethi) [id number] Yes Take 1 tablet by mouth 2 (two) times daily. For Investigational Use Only. Bring all bottles back to next visit. Contact [facility] for questions about this medication. Rosemarie Eather GORMAN, MD  Active   tamsulosin  (FLOMAX ) 0.4 MG CAPS capsule [id number] Yes Take 2 capsules (0.8 mg total) by mouth daily.  Patient taking differently: Take 0.8 mg by mouth daily. Patiet reports one in am and one in pm   Cleatus ARLYSS GORMAN, MD  Active Self, Pharmacy Records            Recommendation:   PCP Follow-up Specialty provider follow-up If you do not hear from Kidney Specialist to schedule in one week please notify PCP Continue Current Plan of Care  Follow Up Plan:   Telephone follow-up in 1 month  Nestora Duos, MSN, RN [facility]  [facility] RN Care Manager Direct Dial: [phone] Fax: [phone]

## 2024-06-10 ENCOUNTER — Encounter: Payer: Self-pay | Admitting: Family Medicine

## 2024-06-11 DIAGNOSIS — R7989 Other specified abnormal findings of blood chemistry: Secondary | ICD-10-CM | POA: Insufficient documentation

## 2024-06-11 NOTE — Assessment & Plan Note (Signed)
 Stop metformin  for now.  I asked him to update me if he does not get a call about seeing the kidney clinic.

## 2024-06-11 NOTE — Assessment & Plan Note (Signed)
 Discussed options. If any more trouble with low sugars, then cut back by 2 units per day.  He had symptoms after insulin  injection.  Discussed routine insulin  injection technique.  I question if he had isolated rapid absorption in a capillary bed that caused transient hypoglycemia. Hold metformin  in the meantime.  See creatinine discussion. I asked him to update me about his situation next week.

## 2024-06-12 ENCOUNTER — Encounter: Payer: Self-pay | Admitting: Pharmacist

## 2024-06-12 ENCOUNTER — Ambulatory Visit

## 2024-06-13 ENCOUNTER — Ambulatory Visit (INDEPENDENT_AMBULATORY_CARE_PROVIDER_SITE_OTHER)

## 2024-06-13 DIAGNOSIS — I639 Cerebral infarction, unspecified: Secondary | ICD-10-CM | POA: Diagnosis not present

## 2024-06-14 ENCOUNTER — Telehealth: Payer: Self-pay

## 2024-06-14 DIAGNOSIS — M25511 Pain in right shoulder: Secondary | ICD-10-CM | POA: Diagnosis not present

## 2024-06-14 LAB — CUP PACEART REMOTE DEVICE CHECK
Date Time Interrogation Session: 20251006233825
Implantable Pulse Generator Implant Date: 20250501

## 2024-06-14 NOTE — Telephone Encounter (Signed)
 Patient arrived in office to pick up medication

## 2024-06-14 NOTE — Telephone Encounter (Signed)
 Spoke with patient to advise that he has 4 boxes of the Tresiba  available for pickup

## 2024-06-16 ENCOUNTER — Ambulatory Visit: Payer: Self-pay | Admitting: Cardiology

## 2024-06-16 ENCOUNTER — Ambulatory Visit: Admitting: Family Medicine

## 2024-06-16 NOTE — Progress Notes (Signed)
 Remote Loop Recorder Transmission

## 2024-06-21 ENCOUNTER — Ambulatory Visit: Payer: Self-pay

## 2024-06-21 NOTE — Telephone Encounter (Signed)
 Noted. Thanks.

## 2024-06-21 NOTE — Telephone Encounter (Signed)
 FYI Only or Action Required?: FYI only for provider.  Patient was last seen in primary care on 06/09/2024 by Cleatus Arlyss RAMAN, MD.  Called Nurse Triage reporting Advice Only.  Symptoms began today.  Interventions attempted: Other: patient took missed dose of insulin . Triage Disposition: Information or Advice Only Call  Patient/caregiver understands and will follow disposition?: Yes     Copied from CRM [id number]. Topic: Clinical - Red Word Triage >> Jun 21, 2024 11:59 AM Rea ORN wrote: Red Word that prompted transfer to Nurse Triage: High BG 281 this morning. Reason for Disposition  Health information question, no triage required and triager able to answer question  Answer Assessment - Initial Assessment Questions 1. REASON FOR CALL: What is the main reason for your call? or How can I best help you?    Pt originally called in today to request appt with PCP due to elevated BS.  As soon as nurse answered the pt stated I took all oral medication today but I forgot to take my insulin - pt stated that while on hold from [facility] he took the insulin .  Nurse advised pt to recheck blood sugars in about 30 minutes to ensure trending down - up no change call us  back and we will schedule appt.  Pt stated no s/s of high blood sugar at present time.  Protocols used: Information Only Call - No Triage-A-AH

## 2024-06-21 NOTE — Telephone Encounter (Signed)
 FYI

## 2024-06-22 ENCOUNTER — Encounter: Payer: Self-pay | Admitting: Family Medicine

## 2024-06-25 NOTE — Telephone Encounter (Signed)
 Please check with the referral department about his renal referral.  Thanks.

## 2024-06-27 NOTE — Progress Notes (Signed)
 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 This was to help rule out possibility of CHF being present to contribute to symptoms.

## 2024-06-29 DIAGNOSIS — M25511 Pain in right shoulder: Secondary | ICD-10-CM | POA: Diagnosis not present

## 2024-06-30 ENCOUNTER — Other Ambulatory Visit: Payer: Self-pay | Admitting: Family Medicine

## 2024-06-30 DIAGNOSIS — E1149 Type 2 diabetes mellitus with other diabetic neurological complication: Secondary | ICD-10-CM

## 2024-07-04 DIAGNOSIS — T1502XA Foreign body in cornea, left eye, initial encounter: Secondary | ICD-10-CM | POA: Diagnosis not present

## 2024-07-05 ENCOUNTER — Other Ambulatory Visit (HOSPITAL_COMMUNITY): Payer: Self-pay | Admitting: Neurology

## 2024-07-05 DIAGNOSIS — M75121 Complete rotator cuff tear or rupture of right shoulder, not specified as traumatic: Secondary | ICD-10-CM | POA: Diagnosis not present

## 2024-07-05 MED ORDER — STUDY - LIBREXIA-STROKE - MILVEXIAN 25 MG OR PLACEBO TABLET (PI-SETHI): 1.0000 | ORAL_TABLET | Freq: Two times a day (BID) | ORAL | 0 refills | Status: DC

## 2024-07-06 ENCOUNTER — Ambulatory Visit (INDEPENDENT_AMBULATORY_CARE_PROVIDER_SITE_OTHER)

## 2024-07-06 DIAGNOSIS — M65321 Trigger finger, right index finger: Secondary | ICD-10-CM | POA: Diagnosis not present

## 2024-07-06 DIAGNOSIS — Z23 Encounter for immunization: Secondary | ICD-10-CM | POA: Diagnosis not present

## 2024-07-06 NOTE — Telephone Encounter (Signed)
 Referral has been faxed to Nephrology in [facility]   Patient notified via MyChart and Letter sent

## 2024-07-07 ENCOUNTER — Other Ambulatory Visit: Payer: Self-pay

## 2024-07-07 NOTE — Patient Instructions (Addendum)
 Visit Information  Thank you for taking time to visit with me today. Please don't hesitate to contact me if I can be of assistance to you before our next scheduled appointment.  Your next care management appointment is by telephone on 07/26/2024 at 1:00 pm  Telephone follow-up in 1 month  Please call the care guide team at [phone] if you need to cancel, schedule, or reschedule an appointment.   Please call the Suicide and Crisis Lifeline: 988 call the [place]  National Suicide Prevention Lifeline: (601) 844-4400 or TTY: 907-050-1665 TTY (303)679-7872) to talk to a trained counselor call 1-800-273-TALK (toll free, 24 hour hotline) go to [facility] [address], [place] [phone]) call 911 if you are experiencing a Mental Health or Behavioral Health Crisis or need someone to talk to.  Nestora Duos, MSN, RN [facility], [facility] RN Care Manager Direct Dial: [phone] Fax: [phone]   Chronic Kidney Disease: Eating Plan Chronic kidney disease (CKD) is when your kidneys aren't working well. They can't remove waste, fluids, and other substances from your blood. When these substances build up, they can worsen kidney damage and affect your health. Eating certain foods can lead to a buildup of these substances. Changing your diet can help prevent more kidney damage. Diet changes may also delay dialysis or even keep you from needing it. What nutrients should I limit? Work with your health care team and an expert in healthy eating (dietitian) to make a meal plan that's right for you. Foods you can eat and foods you should limit or avoid will depend on: The stage of your kidney disease. Any other conditions you have. The items listed below are not a complete list. Talk with a dietitian to learn what's best for you. Potassium Potassium affects how well your heart beats. Too much potassium in your blood  can cause an irregular heartbeat or even a heart attack. You may need to limit foods that are high in potassium, such as: Liquid milk and soy milk. Salt substitutes that contain potassium. Fruits like: Bananas. Apricots. Melon. Prunes and raisins. Kiwi. Nectarines and oranges. Vegetables, such as: Potatoes, sweet potatoes, and yams. Tomatoes. Leafy greens. Beets. Avocado. Pumpkin and winter squash. Beans, like lima beans. Nuts. Phosphorus Phosphorus is a mineral found in your bones. You need a balance between calcium  and phosphorus to build and maintain healthy bones.  Too much added phosphorus from the foods you eat can pull calcium  from your bones. Losing calcium  can make your bones weak and more likely to break. Too much phosphorus can also make your skin itch. You may need to limit foods that are high in phosphorus or that have added phosphorus, such as: Liquid milk and dairy products. Dark-colored sodas or soft drinks. Bran cereals and oatmeal. Protein  Protein helps your body make and keep muscle. Protein also helps to repair your body's cells and tissues.  One of the natural breakdown products of protein is a waste product called urea . When your kidneys aren't working well, they can't remove waste like urea . Reducing protein in your diet can help keep urea  from building up in your blood. Depending on your stage of kidney disease, you may need to eat smaller portions of foods that are high in protein. Sources of animal protein include: Meat (all types). Fish and seafood. Poultry. Eggs. Dairy. Other protein foods include: Beans and legumes. Nuts and nut butter. Soy, like tofu.  Sodium Salt (sodium) helps to keep a  healthy balance of fluids in your body. Too much salt can increase your blood pressure, which can harm your heart and lungs. Extra salt can also cause your body to keep too much fluid, making your kidneys work harder. You may need to limit or avoid foods  that are high in salt, such as: Salt seasonings. Soy and teriyaki sauce. Meats that are: Packaged. Precooked. Cured. Processed. Salted crackers and snack foods. Fast food. Canned soups and foods. Pickled foods. Boxed mixes or ready-to-eat boxed meals and side dishes. Bottled dressings, sauces, and marinades. Talk with your dietitian about how much potassium, phosphorus, protein, and salt you may have each day. What are tips for following this plan? Reading food labels  Check the amount of salt in foods. Limit foods that have salt listed among the first five ingredients. Try to eat low-salt foods. Check the ingredient list for added phosphorus or potassium. Phos in an ingredient is a sign that phosphorus has been added. Do not buy foods that are calcium -enriched or that have calcium  added to them (fortified). Buy canned vegetables and beans that say no salt added. Rinse them before eating. Lifestyle Limit the amount of protein you eat from animal sources each day. Focus on protein from plant sources, like tofu and dried beans, peas, and lentils. Do not add salt to food when cooking or before eating. Do not eat star fruit. It can be toxic for people with kidney problems. Talk with your health care provider before taking any vitamin or mineral supplements. If told by your provider: Track how much liquid you have so you can avoid drinking too much. Try to eat foods that are made mostly from water, like gelatin, ice cream, soups, and juicy fruits and vegetables. If you have diabetes and chronic kidney disease: If you have diabetes and CKD, you need to keep your blood sugar (glucose) in the target range recommended by your provider. Follow your diabetes management plan. This may include: Checking your blood glucose regularly. Taking medicines by mouth, or taking insulin , or both. Exercising for at least 30 minutes on 5 or more days each week, or as told by your provider. Tracking how  many servings of carbohydrates you eat at each meal. Not using orange juice to treat low blood sugars. Instead, use apple juice, cranberry juice, or clear soda. You may be given guidelines on what foods and nutrients you may eat, and how much you can have each day. This depends on your stage of kidney disease and whether you have high blood pressure. Follow the meal plan your dietitian gives you. Where to find more information [employer] of Diabetes and Digestive and Kidney Diseases: stagesync.si National Kidney Foundation: kidney.org This information is not intended to replace advice given to you by your health care provider. Make sure you discuss any questions you have with your health care provider. Document Revised: 04/06/2023 Document Reviewed: 04/06/2023 Elsevier Patient Education  2024 [employer].

## 2024-07-07 NOTE — Telephone Encounter (Signed)
 Noted. Thanks.

## 2024-07-07 NOTE — Patient Outreach (Signed)
 Complex Care Management   Visit Note  07/07/2024  Name:  Tony Manning MRN: [id number] DOB: 1960/12/01  Situation: Referral received for Complex Care Management related to Diabetes with Complications, Chronic Kidney Disease, and HTN I obtained verbal consent from Patient.  Visit completed with Patient  on the phone  Background:   Past Medical History:  Diagnosis Date   Anginal pain    Basal cell carcinoma of face    Chest pain, unspecified    Chronic kidney disease    kidney stones   Coronary atherosclerosis of native coronary artery    s/p stent x5   Depression    Diabetes mellitus without mention of complication    Diverticulosis    Dyspnea    Dysrhythmia    Erectile dysfunction    Essential hypertension, benign    Family history of adverse reaction to anesthesia    GERD (gastroesophageal reflux disease)    Heart murmur    History of kidney stones    History of nephrolithiasis    Hyperlipidemia    Migraine    Myocardial infarction (HCC)    2016   Neuromuscular disorder (HCC)    Osteoarthritis    Pneumonia    Postsurgical percutaneous transluminal coronary angioplasty status    s/p stent x4   Sarcoidosis    Sleep apnea    uses CPAP   Unspecified sleep apnea    uses C-pap    Assessment: Patient Reported Symptoms:  Cognitive Cognitive Status: No symptoms reported Cognitive/Intellectual Conditions Management [RPT]: None reported or documented in medical history or problem list   Health Maintenance Behaviors: Annual physical exam, Immunizations (Flu shot 06/2024)  Neurological Neurological Review of Symptoms: Dizziness, Headaches, Numbness Neurological Comment: no light/noise sensitivity, occasional headaches and dizziness L side weak post CVA  HEENT HEENT Symptoms Reported: No symptoms reported HEENT Comment: piece of metal in Left eye - removed last Tuesday, followed up yesterday no further issues    Cardiovascular Cardiovascular Symptoms Reported: No  symptoms reported Does patient have uncontrolled Hypertension?: No Cardiovascular Management Strategies: Medication therapy, Routine screening Weight: 195 lb (88.5 kg) Cardiovascular Comment: not checking BP regularly - will try to be more consistent, last reading a few days ago 120/73, using CPAP  Respiratory Other Respiratory Symptoms: Dry mouth with CPAP - will discuss with pulmonary - has used drops, lozenges etc without relief, DOE unchanged Respiratory Management Strategies: Routine screening, CPAP  Endocrine List most recent blood sugar readings, include date and time of day: FBG today 283,   - had steroid injection in finger yesterday, increaed insulin  to cover, denies lows, reports overall average 171 last 2 Illona Bulman    Gastrointestinal Gastrointestinal Symptoms Reported: No symptoms reported      Genitourinary      Integumentary Integumentary Symptoms Reported: No symptoms reported    Musculoskeletal Musculoskelatal Symptoms Reviewed: Back pain, Joint pain, Muscle pain Additional Musculoskeletal Details: shoulder pain right rotator cuff surgery possible now 6/10, neck and back better, no recent falls   Falls in the past year?: Yes Number of falls in past year: 2 or more Was there an injury with Fall?: Yes Fall Risk Category Calculator: 3 Patient Fall Risk Level: High Fall Risk Patient at Risk for Falls Due to: History of fall(s), Impaired balance/gait, Orthopedic patient Fall risk Follow up: Falls evaluation completed, Falls prevention discussed  Psychosocial Psychosocial Symptoms Reported: No symptoms reported          07/07/2024    PHQ2-9 Depression Screening   Little  interest or pleasure in doing things Not at all  Feeling down, depressed, or hopeless Not at all  PHQ-2 - Total Score 0  Trouble falling or staying asleep, or sleeping too much    Feeling tired or having little energy    Poor appetite or overeating     Feeling bad about yourself - or that you are a  failure or have let yourself or your family down    Trouble concentrating on things, such as reading the newspaper or watching television    Moving or speaking so slowly that other people could have noticed.  Or the opposite - being so fidgety or restless that you have been moving around a lot more than usual    Thoughts that you would be better off dead, or hurting yourself in some way    PHQ2-9 Total Score    If you checked off any problems, how difficult have these problems made it for you to do your work, take care of things at home, or get along with other people    Depression Interventions/Treatment      There were no vitals filed for this visit.  Medications Reviewed Today     Reviewed by Devra Lands, RN (Registered Nurse) on 07/07/24 at 1403  Med List Status: <None>   Medication Order Taking? Sig Documenting Provider Last Dose Status Informant  amLODipine  (NORVASC ) 5 MG tablet [id number] Yes Take 1 tablet (5 mg total) by mouth daily. Wyn Jackee VEAR Mickey., NP  Active   atorvastatin  (LIPITOR ) 40 MG tablet [id number] Yes TAKE 1 TABLET BY MOUTH EVERY MORNING Duncan, Graham S, MD  Active   carvedilol  (COREG ) 3.125 MG tablet [id number] Yes TAKE 1 TABLET BY MOUTH TWICE A DAY Cleatus Arlyss RAMAN, MD  Active   Cholecalciferol (VITAMIN D3) 25 MCG (1000 UT) CAPS [id number] Yes Take 1 capsule (1,000 Units total) by mouth daily. Cleatus Arlyss RAMAN, MD  Active   clopidogrel  (PLAVIX ) 75 MG tablet [id number] Yes Take 1 tablet (75 mg total) by mouth daily. Rosemarie Eather RAMAN, MD  Active   Continuous Glucose Sensor (FREESTYLE LIBRE 3 PLUS SENSOR) [place] [id number] Yes USE TO CHECK BLOOD SUGAR CONTINUOUSLY. CHANGE SENSOR EVERY 15 DAYS. Cleatus Arlyss RAMAN, MD  Active   cyclobenzaprine  (FLEXERIL ) 5 MG tablet [id number]  TAKE 1 TABLET BY MOUTH EVERYDAY AT BEDTIME  Patient taking differently: As needed   Cleatus Arlyss RAMAN, MD  Active   gabapentin  (NEURONTIN ) 300 MG capsule [id number] Yes TAKE 1-2 CAPSULES BY MOUTH IN THE  EVENING. MAY CAUSE SEDATION Cleatus Arlyss RAMAN, MD  Active   insulin  degludec (TRESIBA  FLEXTOUCH) 100 UNIT/ML FlexTouch Pen [id number] Yes Inject 46-48 Units into the skin daily. Cleatus Arlyss RAMAN, MD  Active   losartan  (COZAAR ) 50 MG tablet [id number] Yes TAKE 1 TABLET BY MOUTH AT BEDTIME Cleatus Arlyss RAMAN, MD  Active   nitroGLYCERIN  (NITROSTAT ) 0.4 MG SL tablet [id number] Yes Place 1 tablet (0.4 mg total) under the tongue every 5 (five) minutes as needed for chest pain. Wyn Jackee VEAR Mickey., NP  Active            Med Note MAYER, ARLYSS RAMAN Kitchens Jan 10, 2024 12:34 PM)    ondansetron  (ZOFRAN ) 4 MG tablet [id number] Yes Take 4 mg by mouth daily as needed for nausea or vomiting. [provider]  Active Self, Pharmacy Records           Med Note MAYER ARLYSS RAMAN Kitchens Jan 10, 2024  12:34 PM)    oxyCODONE -acetaminophen  (PERCOCET/ROXICET) 5-325 MG tablet [id number] Yes Take 0.5-1 tablets by mouth every 6 (six) hours as needed for severe pain. [provider]  Active Self, Pharmacy Records           Med Note MAYER ARLYSS GORMAN Pablo Jan 10, 2024 12:34 PM)    pantoprazole  (PROTONIX ) 40 MG tablet [id number] Yes TAKE 1 TABLET BY MOUTH EVERY DAY IN THE MORNING Cleatus Arlyss GORMAN, MD  Active   Semaglutide , 1 MG/DOSE, (OZEMPIC , 1 MG/DOSE,) 4 MG/3ML SOPN [id number] Yes INJECT 1 MG AS DIRECTED ONCE A WEEK. Cleatus Arlyss GORMAN, MD  Active Self, Pharmacy Records           Med Note MAYER, ARLYSS GORMAN   Fri Feb 04, 2024  8:19 AM)    Laretta GLENWOOD ARTIS - milvexian 25 mg or placebo tablet (PI-Sethi) [id number] Yes Take 1 tablet by mouth 2 (two) times daily. For Investigational Use Only. Bring all bottles back to next visit. Contact [facility] for questions about this medication. Rosemarie Eather GORMAN, MD  Active   tamsulosin  (FLOMAX ) 0.4 MG CAPS capsule [id number] Yes Take 2 capsules (0.8 mg total) by mouth daily.  Patient taking differently: Take 0.8 mg by mouth daily. Patiet reports one in am and one in pm    Cleatus Arlyss GORMAN, MD  Active Self, Pharmacy Records            Recommendation:   PCP Follow-up Specialty provider follow-up Nephrology 08/16/24 Continue Current Plan of Care  Follow Up Plan:   Telephone follow-up in 1 month  Nestora Duos, MSN, RN [facility]  [facility], [facility] RN Care Manager Direct Dial: [phone] Fax: [phone]

## 2024-07-11 ENCOUNTER — Encounter: Payer: Self-pay | Admitting: Family Medicine

## 2024-07-11 ENCOUNTER — Ambulatory Visit: Admitting: Family Medicine

## 2024-07-11 VITALS — BP 120/68 | HR 63 | Temp 97.9°F | Ht 69.96 in | Wt 201.2 lb

## 2024-07-11 DIAGNOSIS — E1149 Type 2 diabetes mellitus with other diabetic neurological complication: Secondary | ICD-10-CM

## 2024-07-11 DIAGNOSIS — R7989 Other specified abnormal findings of blood chemistry: Secondary | ICD-10-CM | POA: Diagnosis not present

## 2024-07-11 DIAGNOSIS — Z794 Long term (current) use of insulin: Secondary | ICD-10-CM

## 2024-07-11 LAB — BASIC METABOLIC PANEL WITH GFR
BUN: 28 mg/dL — ABNORMAL HIGH (ref 6–23)
CO2: 28 meq/L (ref 19–32)
Calcium: 9.5 mg/dL (ref 8.4–10.5)
Chloride: 105 meq/L (ref 96–112)
Creatinine, Ser: 1.44 mg/dL (ref 0.40–1.50)
GFR: 51.41 mL/min — ABNORMAL LOW (ref >60.00)
Glucose, Bld: 229 mg/dL — ABNORMAL HIGH (ref 70–99)
Potassium: 4.5 meq/L (ref 3.5–5.1)
Sodium: 139 meq/L (ref 135–145)

## 2024-07-11 MED ORDER — TRESIBA FLEXTOUCH 100 UNIT/ML ~~LOC~~ SOPN: 58.0000 [IU] | PEN_INJECTOR | Freq: Every day | SUBCUTANEOUS | Status: DC

## 2024-07-11 MED ORDER — TAMSULOSIN HCL 0.4 MG PO CAPS: 0.4000 mg | ORAL_CAPSULE | Freq: Two times a day (BID) | ORAL | Status: DC

## 2024-07-11 NOTE — Progress Notes (Unsigned)
 Diabetes:  Using medications without difficulties: yes, see below.   Hypoglycemic episodes: no Hyperglycemic episodes:no Feet problems:no Blood Sugars averaging: ~220 over the last week, ~180 over the last 2 weeks.   He had steroid injection 5 days ago.   eye exam within last year: yes  Off metformin , taking 58 units tresiba .   He took 1mg  ozempic  this week.    He doesn't have coverage for ozempic  and is going to take his last dose this week.  Per report, he didn't qualify for replacement/help.    He is expecting sugar elevation when he stops ozempic .    D/w pt about renal status.  He had f/u pending for 08/16/24.  No nsaids.  Off metformin .    Prev concussion sx improved.    Meds, vitals, and allergies reviewed.  ROS: Per HPI unless specifically indicated in ROS section   GEN: nad, alert and oriented HEENT: ncat NECK: supple w/o LA CV: rrr. PULM: ctab, no inc wob ABD: soft, +bs EXT: no edema SKIN: well perfused.

## 2024-07-11 NOTE — Patient Instructions (Addendum)
 Let me know about ozempic  coverage with your new insurance.  If no lows, I would add 1 unit of insulin  per day if your AM sugar is above 150.  Take care.  Glad to see you.  Go to the lab on the way out.   If you have mychart we'll likely use that to update you.

## 2024-07-12 ENCOUNTER — Other Ambulatory Visit: Payer: Self-pay | Admitting: Orthopedic Surgery

## 2024-07-12 ENCOUNTER — Telehealth (HOSPITAL_BASED_OUTPATIENT_CLINIC_OR_DEPARTMENT_OTHER): Payer: Self-pay

## 2024-07-12 ENCOUNTER — Ambulatory Visit: Payer: Self-pay | Admitting: Family Medicine

## 2024-07-12 NOTE — Assessment & Plan Note (Signed)
 See notes on labs.

## 2024-07-12 NOTE — Telephone Encounter (Signed)
   Name: Tony Manning  DOB: 31-Oct-1960  MRN: [id number]  Primary Cardiologist: Gordy Bergamo, MD  Chart reviewed as part of pre-operative protocol coverage. Because of [name]'s past medical history and time since last visit, he will require a follow-up in-office visit in order to better assess preoperative cardiovascular risk. Hx of cryptogenic CVA, orthostatic hypotension, near syncope. Procedure is scheduled for 08/24/2024  Pre-op covering staff: - Please schedule appointment and call patient to inform them. If patient already had an upcoming appointment within acceptable timeframe, please add pre-op clearance to the appointment notes so provider is aware. - Please contact requesting surgeon's office via preferred method (i.e, phone, fax) to inform them of need for appointment prior to surgery.    Lamarr Satterfield, NP  07/12/2024, 3:00 PM

## 2024-07-12 NOTE — Telephone Encounter (Signed)
   Pre-operative Risk Assessment    Patient Name: Tony Manning  DOB: [birth year] MRN: [id number]   Date of last office visit: 04/06/24 with Dr. Ladona Date of next office visit: NA   Request for Surgical Clearance    Procedure:  Right Shoulder Arthroscopic Rotator Cuff Repair Subacromial Decompression  Date of Surgery:  Clearance 08/24/24                                 Surgeon:  Eva Herring Surgeon's Group or Practice Name:  [facility] Phone number:  [phone] Fax number:  [phone]   Type of Clearance Requested:   - Medical  - Pharmacy:  Hold Clopidogrel  (Plavix ) not indicated   Type of Anesthesia:  choice   Additional requests/questions:    Bonney Augustin JONETTA Delores   07/12/2024, 2:42 PM

## 2024-07-12 NOTE — Assessment & Plan Note (Signed)
 Off metformin , taking 58 units tresiba .   He took 1mg  ozempic  this week.    He doesn't have coverage for ozempic  and is going to take his last dose this week.  Per report, he didn't qualify for replacement/help.    He is expecting sugar elevation when he stops ozempic .    I asked him to let me know about ozempic  coverage with his new insurance.  If no lows, I would add 1 unit of insulin  per day if AM sugar is above 150.   See notes on labs.

## 2024-07-12 NOTE — Telephone Encounter (Signed)
 Patient has been scheduled for in office

## 2024-07-13 ENCOUNTER — Telehealth: Payer: Self-pay | Admitting: Cardiology

## 2024-07-13 ENCOUNTER — Ambulatory Visit

## 2024-07-13 NOTE — Telephone Encounter (Signed)
 Pt cannot afford anymore remote checks this year. He is requesting a c/b in regard to this issue. Pt has one scheduled tomorrow 11/7 and would like to cancel. Please advise.

## 2024-07-14 ENCOUNTER — Ambulatory Visit

## 2024-07-14 NOTE — Telephone Encounter (Signed)
 Returned call to pt.  Per Pt he cannot afford copay for remote monitoring for loop.  He would like to cancel remotes for now.  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  He will call back in January to advise.  Risk/benefits given to Pt regarding discontinuing monitoring.  He indicates understanding.  Remote checks cancelled and note added in [facility].

## 2024-07-14 NOTE — Telephone Encounter (Signed)
 Patient called to follow-up on stopping his remote checks.

## 2024-07-16 LAB — CUP PACEART REMOTE DEVICE CHECK
Date Time Interrogation Session: 20251107003514
Implantable Pulse Generator Implant Date: 20250501

## 2024-07-18 ENCOUNTER — Ambulatory Visit: Payer: Self-pay | Admitting: Cardiology

## 2024-07-26 ENCOUNTER — Telehealth: Payer: Self-pay

## 2024-07-26 ENCOUNTER — Other Ambulatory Visit: Payer: Self-pay

## 2024-07-26 ENCOUNTER — Other Ambulatory Visit: Payer: Self-pay | Admitting: Neurology

## 2024-07-26 NOTE — Patient Instructions (Signed)
 Tony Manning - I am sorry I was unable to reach you today for our scheduled appointment. I work with Cleatus Arlyss RAMAN, MD and am calling to support your healthcare needs. Please contact me at [phone] at your earliest convenience. I look forward to speaking with you soon.   Thank you,  Nestora Duos, MSN, RN [facility]  [facility], [facility] RN Care Manager Direct Dial: [phone] Fax: [phone]

## 2024-08-01 DIAGNOSIS — G4733 Obstructive sleep apnea (adult) (pediatric): Secondary | ICD-10-CM | POA: Diagnosis not present

## 2024-08-05 DIAGNOSIS — G4733 Obstructive sleep apnea (adult) (pediatric): Secondary | ICD-10-CM | POA: Diagnosis not present

## 2024-08-09 ENCOUNTER — Ambulatory Visit: Attending: Cardiology | Admitting: Cardiology

## 2024-08-09 ENCOUNTER — Encounter: Payer: Self-pay | Admitting: Cardiology

## 2024-08-09 VITALS — BP 118/64 | HR 70 | Resp 16 | Ht 69.0 in | Wt 208.0 lb

## 2024-08-09 DIAGNOSIS — Z01810 Encounter for preprocedural cardiovascular examination: Secondary | ICD-10-CM

## 2024-08-09 DIAGNOSIS — I10 Essential (primary) hypertension: Secondary | ICD-10-CM

## 2024-08-09 DIAGNOSIS — E782 Mixed hyperlipidemia: Secondary | ICD-10-CM | POA: Diagnosis not present

## 2024-08-09 DIAGNOSIS — I2510 Atherosclerotic heart disease of native coronary artery without angina pectoris: Secondary | ICD-10-CM | POA: Diagnosis not present

## 2024-08-09 NOTE — Progress Notes (Signed)
 Cardiology Office Note:  .   Date:  08/09/2024  ID:  Tony Manning, DOB Dec 12, 1960, MRN [id number] PCP: Cleatus Arlyss RAMAN, MD  [facility] Providers Cardiologist:  Gordy Bergamo, MD   History of Present Illness: .   Tony Manning is a 63 y.o. 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  He is enrolled in [place].  He now presents for preoperative cardiovascular risk stratification and clearance for upcoming RIGHT SHOULDER ARTHROPLASTY.  He remains asymptomatic without chest pain or dyspnea.  He continues to remain physically active at home doing all household chores and yard work and is getting about 6000+ steps a day.  Endorses weight gain since discontinuing Ozempic  due to insurance issues but plans to resume in January 2026.    Discussed the use of AI scribe software for clinical note transcription with the patient, who gave verbal consent to proceed.  History of Present Illness Tony Manning is a 63 year old male with diabetes and coronary artery disease who presents for preoperative cardiac evaluation for right shoulder rotator cuff surgery.  He is scheduled for right shoulder rotator cuff surgery. His shoulder pain has worsened again and limits arm use.  His diabetes is poorly controlled. Ozempic  was stopped due to loss of insurance coverage, with associated weight gain and increased hunger, especially at night.  He has stage 3 chronic kidney disease, likely related to diabetes, with kidney function relatively stable this year. He is scheduled to see a nephrologist next week.  He is enrolled in a  Librexia stroke study and continues the study medication. He takes Plavix  without aspirin . His blood pressure is controlled on amlodipine  5 mg daily and carvedilol  3.12 mg twice daily. He takes atorvastatin  40 mg daily with an LDL of 47.  He uses CPAP nightly for sleep apnea but has persistent dry mouth despite different masks and humidifier use.  He remains physically active with outdoor work and walks his dogs, averaging 11,000 to 12,000 steps daily.  Cardiac Studies relevent.    CARDIAC CATHETERIZATION 10/16/2016  (10/16/2016:3.0 x 38 mm Onyx,  2.75x12 mm Taxus in mid LAD, 2.5x12 , Distal RCA stent in 2004 and 2007. On 06/13/14  mid RCA 3.0 x 18 mm Xience Alpine DES  mid LAD  3.0 x 18 mm Xience Alpine DES).    Lexiscan  Tetrofosmin  stress test 10/28/2020: Lexiscan  nuclear stress test performed using 1-day protocol. SPECT imaging shows diaphragmatic tissue attenuation, but otherwise normal myocardial perfusion. Stress LVEF 63%. Low risk study.  ECHOCARDIOGRAM COMPLETE 01/05/2024  1. Left ventricular ejection fraction, by estimation, is 60 to 65%. The left ventricle has normal function. The left ventricle has no regional wall motion abnormalities. There is mild left ventricular hypertrophy of the septal segment. Left ventricular diastolic parameters are consistent with Grade I diastolic dysfunction (impaired relaxation). 2. Right ventricular systolic function is normal. The right ventricular size is normal.  Labs   Lab Results  Component Value Date   CHOL 110 05/11/2024   HDL 46.10 05/11/2024   LDLCALC 47 05/11/2024   TRIG 82.0 05/11/2024   CHOLHDL 2 05/11/2024   No results found for: LIPOA  Recent Labs    01/05/24 0935 01/06/24  9373 05/11/24 1619 05/27/24 1509 05/27/24 1513 06/05/24 1546 07/11/24 0943  NA 137 140   < > 141 142 139 139  K 3.4* 3.6   < > 4.1 4.1 4.6 4.5  CL 104 107   < > 109 110 106 105  CO2 21* 23   < > 21*  --  26 28  GLUCOSE 290* 167*   < > 77 74 126* 229*   BUN 25* 23   < > 22 25* 23 28*  CREATININE 1.38* 1.32*   < > 1.56* 1.60* 2.18* 1.44  CALCIUM  8.9 9.0   < > 9.7  --  9.2 9.5  GFRNONAA 57* >60  --  49*  --   --   --    < > = values in this interval not displayed.    Lab Results  Component Value Date   ALT 33 05/27/2024   AST 37 05/27/2024   ALKPHOS 69 05/27/2024   BILITOT 1.0 05/27/2024      Latest Ref Rng & Units 05/27/2024    3:13 PM 05/27/2024    3:09 PM 05/11/2024    4:19 PM  CBC  WBC 4.0 - 10.5 K/uL  9.5  8.5   Hemoglobin 13.0 - 17.0 g/dL 86.0  85.8  85.8   Hematocrit 39.0 - 52.0 % 41.0  42.0  42.3   Platelets 150 - 400 K/uL  228  194.0    Lab Results  Component Value Date   HGBA1C 7.2 (H) 05/11/2024    Lab Results  Component Value Date   TSH 1.48 05/11/2024     ROS  Review of Systems  Cardiovascular:  Negative for chest pain, dyspnea on exertion and leg swelling.   Physical Exam:   VS:  BP 118/64 (BP Location: Left Arm, Patient Position: Sitting, Cuff Size: Normal)   Pulse 70   Resp 16   Ht 5' 9 (1.753 m)   Wt 208 lb (94.3 kg)   SpO2 96%   BMI 30.72 kg/m    Wt Readings from Last 3 Encounters:  08/09/24 208 lb (94.3 kg)  07/11/24 201 lb 3.2 oz (91.3 kg)  07/07/24 195 lb (88.5 kg)    BP Readings from Last 3 Encounters:  08/09/24 118/64  07/11/24 120/68  06/09/24 134/62   Physical Exam Neck:     Vascular: No carotid bruit or JVD.  Cardiovascular:     Rate and Rhythm: Normal rate and regular rhythm.     Pulses: Intact distal pulses.     Heart sounds: Normal heart sounds. No murmur heard.    No gallop.  Pulmonary:     Effort: Pulmonary effort is normal.     Breath sounds: Normal breath sounds.  Abdominal:     General: Bowel sounds are normal.     Palpations: Abdomen is soft.  Musculoskeletal:     Right lower leg: No edema.     Left lower leg: No edema.    EKG:         ASSESSMENT AND PLAN: .      ICD-10-CM   1. Pre-operative cardiovascular examination  Z01.810     2. Coronary artery  disease involving native coronary artery of native heart without angina pectoris  I25.10     3. Primary hypertension  I10     4. Mixed hyperlipidemia  E78.2      Assessment & Plan Pre-operative cardiovascular risk assessment for right shoulder surgery Low risk for surgery based on coronary artery disease  history and recent cardiac evaluations. No further chest pain reported. Echocardiogram in April 2025 showed normal heart function. Stress test in 2022 revealed no ischemia and low risk with normal heart function. Blood pressure and cholesterol are well controlled. - Proceed with right shoulder surgery as planned - Consult with Dr. Bucky group regarding Librax management.  Coronary artery disease involving native coronary artery without angina Coronary artery disease is well-managed with no recent angina. Heart function is normal as per recent echocardiogram and stress test results. - Continue current management and medications  Primary hypertension Blood pressure is well controlled with current medication regimen. - Continue current antihypertensive medications  Mixed hyperlipidemia Cholesterol levels are well controlled with atorvastatin . Recent LDL level is 47. - Continue atorvastatin  40 mg once daily  Type 2 diabetes mellitus Diabetes management is suboptimal due to discontinuation of Ozempic . Weight gain noted, possibly due to lack of medication coverage. He is participating in the Librexia stroke study. - Encouraged regular physical activity, including walking 15-20 minutes daily - Discussed potential for obtaining low-dose Ozempic  samples from PCP's office  Chronic kidney disease stage 3 Kidney function is relatively stable. He is scheduled to see a nephrologist next week.  Obstructive sleep apnea Experiencing dry mouth despite using CPAP with nasal mask and chin strap. Humidifier recommended to alleviate symptoms. - Use a humidifier in the bedroom to reduce dry mouth -  Continue using CPAP with nasal mask and chin strap  Follow up: As needed as he has remained stable over the last 3 to 4 years from cardiac standpoint.  Patient to obtain all his Rx with his PCP. Signed,  Gordy Bergamo, MD, [facility] 08/09/2024, 7:15 PM [facility] [address] [place], [place] [address] Phone: [phone]. Fax:  [phone]

## 2024-08-09 NOTE — Patient Instructions (Signed)
 Medication Instructions:  Your physician recommends that you continue on your current medications as directed. Please refer to the Current Medication list given to you today.  *If you need a refill on your cardiac medications before your next appointment, please call your pharmacy*   Follow-Up: At [facility], you and your health needs are our priority.  As part of our continuing mission to provide you with exceptional heart care, our providers are all part of one team.  This team includes your primary Cardiologist (physician) and Advanced Practice Providers or APPs (Physician Assistants and Nurse Practitioners) who all work together to provide you with the care you need, when you need it.  Your next appointment:   As needed with Dr. Ganji

## 2024-08-14 ENCOUNTER — Ambulatory Visit

## 2024-08-14 NOTE — Progress Notes (Signed)
 Anesthesia Review:  PCP:  Arlyss Solian  Cardiologist : Ladona  LOV 08/09/24   PPM/ ICD: Device Orders: Rep Notified:  Chest x-ray : EKG : Echo : Stress test: Cardiac Cath :   Activity level:  Sleep Study/ CPAP : Fasting Blood Sugar :      / Checks Blood Sugar -- times a day:     DM- type  Hgba1c-   Tresiba -   Blood Thinner/ Instructions /Last Dose: ASA / Instructions/ Last Dose :    Plavix     PT on a Study Drug

## 2024-08-14 NOTE — Patient Instructions (Addendum)
 SURGICAL WAITING ROOM VISITATION  Patients having surgery or a procedure may have no more than 2 support people in the waiting area - these visitors may rotate.    Children under the age of 74 must have an adult with them who is not the patient.  Visitors with respiratory illnesses are discouraged from visiting and should remain at home.  If the patient needs to stay at the hospital during part of their recovery, the visitor guidelines for inpatient rooms apply. Pre-op nurse will coordinate an appropriate time for 1 support person to accompany patient in pre-op.  This support person may not rotate.    Please refer to the [facility] website for the visitor guidelines for Inpatients (after your surgery is over and you are in a regular room).       Your procedure is scheduled on:   08/24/2024    Report to [facility] Main Entrance    Report to admitting at  0830 AM   Call this number if you have problems the morning of surgery [phone]   Do not eat food :After Midnight.   After Midnight you may have the following liquids until _ 0730_____ AM DAY OF SURGERY  Water Non-Citrus Juices (without pulp, NO RED-Apple, White grape, White cranberry) Black Coffee (NO MILK/CREAM OR CREAMERS, sugar ok)  Clear Tea (NO MILK/CREAM OR CREAMERS, sugar ok) regular and decaf                             Plain Jell-O (NO RED)                                           Fruit ices (not with fruit pulp, NO RED)                                     Popsicles (NO RED)                                                               Sports drinks like Gatorade (NO RED)                    The day of surgery:  Drink ONE (1) Pre-Surgery Clear Ensure or G2 at  0730 AM the morning of surgery. Drink in one sitting. Do not sip.  This drink was given to you during your hospital  pre-op appointment visit. Nothing else to drink after completing the  Pre-Surgery Clear Ensure or G2.          If you have  questions, please contact your surgeon's office.   FOLLOW BOWEL PREP AND ANY ADDITIONAL PRE OP INSTRUCTIONS YOU RECEIVED FROM YOUR SURGEON'S OFFICE!!!     Oral Hygiene is also important to reduce your risk of infection.                                    Remember - BRUSH YOUR TEETH THE MORNING OF SURGERY WITH YOUR REGULAR  TOOTHPASTE  DENTURES WILL BE REMOVED PRIOR TO SURGERY PLEASE DO NOT APPLY Poly grip OR ADHESIVES!!!   Do NOT smoke after Midnight   Stop all vitamins and herbal supplements 7 days before surgery.   Take these medicines the morning of surgery with A SIP OF WATER:  amlodipione, coreg , protonix , flomax                Tresiba - Take   DO NOT TAKE ANY ORAL DIABETIC MEDICATIONS DAY OF YOUR SURGERY  Bring CPAP mask and tubing day of surgery.                              You may not have any metal on your body including hair pins, jewelry, and body piercing             Do not wear make-up, lotions, powders, perfumes/cologne, or deodorant  Do not wear nail polish including gel and S&S, artificial/acrylic nails, or any other type of covering on natural nails including finger and toenails. If you have artificial nails, gel coating, etc. that needs to be removed by a nail salon please have this removed prior to surgery or surgery may need to be canceled/ delayed if the surgeon/ anesthesia feels like they are unable to be safely monitored.   Do not shave  48 hours prior to surgery.               Men may shave face and neck.   Do not bring valuables to the hospital. [place] IS NOT             RESPONSIBLE   FOR VALUABLES.   Contacts, glasses, dentures or bridgework may not be worn into surgery.   Bring small overnight bag day of surgery.   DO NOT BRING YOUR HOME MEDICATIONS TO THE HOSPITAL. PHARMACY WILL DISPENSE MEDICATIONS LISTED ON YOUR MEDICATION LIST TO YOU DURING YOUR ADMISSION IN THE HOSPITAL!    Patients discharged on the day of surgery will not be allowed to  drive home.  Someone NEEDS to stay with you for the first 24 hours after anesthesia.   Special Instructions: Bring a copy of your healthcare power of attorney and living will documents the day of surgery if you haven't scanned them before.              Please read over the following fact sheets you were given: IF YOU HAVE QUESTIONS ABOUT YOUR PRE-OP INSTRUCTIONS PLEASE CALL [phone].   If you received a COVID test during your pre-op visit  it is requested that you wear a mask when out in public, stay away from anyone that may not be feeling well and notify your surgeon if you develop symptoms. If you test positive for Covid or have been in contact with anyone that has tested positive in the last 10 days please notify you surgeon.    [place] - Preparing for Surgery Before surgery, you can play an important role.  Because skin is not sterile, your skin needs to be as free of germs as possible.  You can reduce the number of germs on your skin by washing with CHG (chlorahexidine gluconate) soap before surgery.  CHG is an antiseptic cleaner which kills germs and bonds with the skin to continue killing germs even after washing. Please DO NOT use if you have an allergy to CHG or antibacterial soaps.  If your skin becomes reddened/irritated stop using the  CHG and inform your nurse when you arrive at Short Stay. Do not shave (including legs and underarms) for at least 48 hours prior to the first CHG shower.  You may shave your face/neck.  Please follow these instructions carefully:  1.  Shower with CHG Soap the night before surgery ONLY (DO NOT USE THE SOAP THE MORNING OF SURGERY).  2.  If you choose to wash your hair, wash your hair first as usual with your normal  shampoo.  3.  After you shampoo, rinse your hair and body thoroughly to remove the shampoo.                             4.  Use CHG as you would any other liquid soap.  You can apply chg directly to the skin and wash.  Gently with a scrungie  or clean washcloth.  5.  Apply the CHG Soap to your body ONLY FROM THE NECK DOWN.   Do   not use on face/ open                           Wound or open sores. Avoid contact with eyes, ears mouth and   genitals (private parts).                       Wash face,  Genitals (private parts) with your normal soap.             6.  Wash thoroughly, paying special attention to the area where your    surgery  will be performed.  7.  Thoroughly rinse your body with warm water from the neck down.  8.  DO NOT shower/wash with your normal soap after using and rinsing off the CHG Soap.                9.  Pat yourself dry with a clean towel.            10.  Wear clean pajamas.            11.  Place clean sheets on your bed the night of your first shower and do not  sleep with pets. Day of Surgery : Do not apply any CHG, lotions/deodorants the morning of surgery.  Please wear clean clothes to the hospital/surgery center.  FAILURE TO FOLLOW THESE INSTRUCTIONS MAY RESULT IN THE CANCELLATION OF YOUR SURGERY  PATIENT SIGNATURE_________________________________  NURSE SIGNATURE__________________________________  ________________________________________________________________________

## 2024-08-15 ENCOUNTER — Other Ambulatory Visit: Payer: Self-pay

## 2024-08-15 ENCOUNTER — Encounter (HOSPITAL_COMMUNITY): Payer: Self-pay | Admitting: Orthopedic Surgery

## 2024-08-15 ENCOUNTER — Encounter (HOSPITAL_COMMUNITY): Payer: Self-pay

## 2024-08-15 ENCOUNTER — Encounter (HOSPITAL_COMMUNITY)
Admission: RE | Admit: 2024-08-15 | Discharge: 2024-08-15 | Disposition: A | Source: Ambulatory Visit | Attending: Anesthesiology

## 2024-08-15 ENCOUNTER — Telehealth: Payer: Self-pay | Admitting: *Deleted

## 2024-08-15 ENCOUNTER — Ambulatory Visit: Admitting: Family Medicine

## 2024-08-15 NOTE — Telephone Encounter (Signed)
 Dr Rosemarie has cleared the patient. Signed form has been faxed back to [facility]. Received a receipt of confirmation.

## 2024-08-15 NOTE — Telephone Encounter (Signed)
 Received fax from [facility] for clearance for right shoulder arthroscopic rotator cuff repair subacromial decompression on 08/24/2024 under anesthesia of choice.   I called the patient. He is enrolled in the [facility] study and is on either Melvexian or placeb he is also on Plavix  75 mg.  He reports no strokes since his previous one that he saw Dr. Rosemarie for last May.  He states he was already told by surgeon to stop his Plavix  on Saturday 08/19/24, 5 days prior to surgery.  He states he spoke with Lauraine in the research department as well who told him to treat the study drug like Plavix  so he will plan to stop both effective 08/19/24.  I will have this reviewed by Dr. Rosemarie and sign off if agree.  Patient will only receive a callback from us  if there is an issue.  Otherwise he is to follow his surgeon's instructions for blood thinner management.  He appreciated the call.

## 2024-08-15 NOTE — Patient Outreach (Signed)
 RNCM - 3rd attempt to reach patient to reschedule missed appointment. Patient declining to reschedule at this time. Aware can request new referral from PCP or contact RNCM to resume services if needed. PCP notified.

## 2024-08-15 NOTE — Patient Instructions (Signed)
 Visit Information  Thank you for taking time to visit with me today. Please don't hesitate to contact me if I can be of assistance to you before our next scheduled appointment.  Your next care management appointment is no further scheduled appointments.    Closing From: Complex Care Management.  Please call the care guide team at [phone] if you need to cancel, schedule, or reschedule an appointment.   Please call the Suicide and Crisis Lifeline: 988 call the [place]  National Suicide Prevention Lifeline: 508 073 6807 or TTY: (915)023-5940 TTY 4407023144) to talk to a trained counselor call 1-800-273-TALK (toll free, 24 hour hotline) go to [facility] [address] [place] [phone]) call 911 if you are experiencing a Mental Health or Behavioral Health Crisis or need someone to talk to.   Nestora Duos, MSN, RN [facility], [facility] RN Care Manager Direct Dial: [phone] Fax: [phone]

## 2024-08-16 DIAGNOSIS — Z794 Long term (current) use of insulin: Secondary | ICD-10-CM | POA: Diagnosis not present

## 2024-08-16 DIAGNOSIS — R8290 Unspecified abnormal findings in urine: Secondary | ICD-10-CM | POA: Diagnosis not present

## 2024-08-16 DIAGNOSIS — I129 Hypertensive chronic kidney disease with stage 1 through stage 4 chronic kidney disease, or unspecified chronic kidney disease: Secondary | ICD-10-CM | POA: Diagnosis not present

## 2024-08-16 DIAGNOSIS — E1122 Type 2 diabetes mellitus with diabetic chronic kidney disease: Secondary | ICD-10-CM | POA: Diagnosis not present

## 2024-08-16 DIAGNOSIS — E785 Hyperlipidemia, unspecified: Secondary | ICD-10-CM | POA: Diagnosis not present

## 2024-08-16 DIAGNOSIS — N1832 Chronic kidney disease, stage 3b: Secondary | ICD-10-CM | POA: Diagnosis not present

## 2024-08-17 ENCOUNTER — Other Ambulatory Visit: Payer: Self-pay | Admitting: Nephrology

## 2024-08-17 DIAGNOSIS — R8290 Unspecified abnormal findings in urine: Secondary | ICD-10-CM

## 2024-08-17 DIAGNOSIS — E1122 Type 2 diabetes mellitus with diabetic chronic kidney disease: Secondary | ICD-10-CM

## 2024-08-17 DIAGNOSIS — N1832 Chronic kidney disease, stage 3b: Secondary | ICD-10-CM

## 2024-08-17 DIAGNOSIS — I129 Hypertensive chronic kidney disease with stage 1 through stage 4 chronic kidney disease, or unspecified chronic kidney disease: Secondary | ICD-10-CM

## 2024-08-18 ENCOUNTER — Ambulatory Visit

## 2024-08-21 ENCOUNTER — Encounter (HOSPITAL_COMMUNITY): Admission: RE | Admit: 2024-08-21

## 2024-08-22 ENCOUNTER — Ambulatory Visit: Payer: Self-pay

## 2024-08-22 DIAGNOSIS — E1149 Type 2 diabetes mellitus with other diabetic neurological complication: Secondary | ICD-10-CM

## 2024-08-22 NOTE — Telephone Encounter (Signed)
 Call dropped upon transfer to NT. This RN made first attempt to triage patient. No answer, LVM. Routing for additional attempts.   Copied from CRM #[id number]. Topic: Clinical - Red Word Triage >> Aug 22, 2024  4:35 PM Tony Manning wrote: Red Word that prompted transfer to Nurse Triage: Diabetes management- can't get insulin  adjusted. Sensor reading blood sugar is too high and too low. Patient stated he did not feel any symptoms but believes his sensor is not working properly.  Would like an appt to speak with PCP

## 2024-08-22 NOTE — Telephone Encounter (Signed)
 Third attempt to contact patient for triage.  LVM for patient to return call to [phone].  Call routed to office at [facility] for follow up.   Copied from CRM #[id number]. Topic: Clinical - Red Word Triage >> Aug 22, 2024  4:35 PM Wess RAMAN wrote: Red Word that prompted transfer to Nurse Triage: Diabetes management- can't get insulin  adjusted. Sensor reading blood sugar is too high and too low. Patient stated he did not feel any symptoms but believes his sensor is not working properly.  Would like an appt to speak with PCP

## 2024-08-22 NOTE — Telephone Encounter (Signed)
 Second attempt to contact patient for triage.  LVM for patient to return call to [phone].  Placed in callbacks for additional attempt.   Copied from CRM #[id number]. Topic: Clinical - Red Word Triage >> Aug 22, 2024  4:35 PM Wess RAMAN wrote: Red Word that prompted transfer to Nurse Triage: Diabetes management- can't get insulin  adjusted. Sensor reading blood sugar is too high and too low. Patient stated he did not feel any symptoms but believes his sensor is not working properly.  Would like an appt to speak with PCP

## 2024-08-23 NOTE — Telephone Encounter (Signed)
 Left message to return call to our office.

## 2024-08-24 ENCOUNTER — Encounter (HOSPITAL_COMMUNITY): Payer: Self-pay | Admitting: Medical

## 2024-08-24 ENCOUNTER — Ambulatory Visit (HOSPITAL_COMMUNITY): Admission: RE | Admit: 2024-08-24 | Admitting: Orthopedic Surgery

## 2024-08-24 ENCOUNTER — Encounter (HOSPITAL_COMMUNITY): Admission: RE | Payer: Self-pay | Source: Home / Self Care

## 2024-08-24 HISTORY — DX: Cerebral infarction, unspecified: I63.9

## 2024-08-24 SURGERY — ARTHROSCOPY, SHOULDER, WITH ROTATOR CUFF REPAIR
Anesthesia: Choice | Laterality: Right

## 2024-08-25 ENCOUNTER — Ambulatory Visit: Admitting: Family Medicine

## 2024-08-25 NOTE — Telephone Encounter (Signed)
 Can he use a back up/different meter in the meantime?

## 2024-08-25 NOTE — Telephone Encounter (Signed)
 Unable to reach patient.

## 2024-08-28 ENCOUNTER — Ambulatory Visit: Admitting: Family Medicine

## 2024-08-28 ENCOUNTER — Ambulatory Visit
Admission: RE | Admit: 2024-08-28 | Discharge: 2024-08-28 | Disposition: A | Discharge status: Home / Self Care | Source: Ambulatory Visit | Attending: Nephrology | Admitting: Nephrology

## 2024-08-28 DIAGNOSIS — R8290 Unspecified abnormal findings in urine: Secondary | ICD-10-CM | POA: Diagnosis not present

## 2024-08-28 DIAGNOSIS — E1122 Type 2 diabetes mellitus with diabetic chronic kidney disease: Secondary | ICD-10-CM | POA: Insufficient documentation

## 2024-08-28 DIAGNOSIS — N1832 Chronic kidney disease, stage 3b: Secondary | ICD-10-CM | POA: Insufficient documentation

## 2024-08-28 DIAGNOSIS — I129 Hypertensive chronic kidney disease with stage 1 through stage 4 chronic kidney disease, or unspecified chronic kidney disease: Secondary | ICD-10-CM | POA: Diagnosis not present

## 2024-08-28 NOTE — Telephone Encounter (Signed)
 Lvm asking pt to call back. Need to find out if pt has a glucometer to monitor BS readings until OV.

## 2024-08-29 NOTE — Telephone Encounter (Signed)
 Spoke with patient.  He does not have a back up/different meter. States he only has his libre and used to have strips in the past.  Pt has no further questions or concerns.

## 2024-08-29 NOTE — Telephone Encounter (Signed)
 If he is willing to get a separate meter as a backup, please see what is covered and send that.  Thanks.

## 2024-08-30 MED ORDER — LANCETS MISC: 1.0000 | 0 refills | Status: DC

## 2024-08-30 MED ORDER — BLOOD GLUCOSE TEST VI STRP: 1.0000 | ORAL_STRIP | Freq: Three times a day (TID) | 0 refills | Status: DC

## 2024-08-30 MED ORDER — BLOOD GLUCOSE MONITORING SUPPL DEVI: 1.0000 | Freq: Three times a day (TID) | 0 refills | Status: DC

## 2024-08-30 MED ORDER — LANCET DEVICE MISC: 1.0000 | Freq: Three times a day (TID) | 0 refills | Status: DC

## 2024-08-30 MED ORDER — BLOOD GLUCOSE TEST VI STRP: ORAL_STRIP | 2 refills | Status: DC

## 2024-08-30 NOTE — Addendum Note (Signed)
 Addended by: SEBASTIAN DANNA GRADE on: 08/30/2024 12:55 PM   Modules accepted: Orders

## 2024-08-30 NOTE — Addendum Note (Signed)
 Addended by: SEBASTIAN DANNA GRADE on: 08/30/2024 12:53 PM   Modules accepted: Orders

## 2024-08-30 NOTE — Telephone Encounter (Signed)
 Called patient ok to have sent in will send to local pharmacy now.

## 2024-09-04 ENCOUNTER — Other Ambulatory Visit: Payer: Self-pay | Admitting: *Deleted

## 2024-09-04 DIAGNOSIS — E1149 Type 2 diabetes mellitus with other diabetic neurological complication: Secondary | ICD-10-CM

## 2024-09-04 MED ORDER — LANCETS MISC: 1.0000 | 0 refills | Status: AC

## 2024-09-04 MED ORDER — BLOOD GLUCOSE MONITORING SUPPL DEVI: 1.0000 | Freq: Three times a day (TID) | 0 refills | Status: AC

## 2024-09-04 MED ORDER — BLOOD GLUCOSE TEST VI STRP: 1.0000 | ORAL_STRIP | Freq: Three times a day (TID) | 0 refills | Status: AC

## 2024-09-04 MED ORDER — LANCET DEVICE MISC: 1.0000 | Freq: Three times a day (TID) | 0 refills | Status: AC

## 2024-09-06 ENCOUNTER — Ambulatory Visit: Admitting: Podiatry

## 2024-09-11 ENCOUNTER — Ambulatory Visit: Admitting: Family Medicine

## 2024-09-11 ENCOUNTER — Encounter: Payer: Self-pay | Admitting: Family Medicine

## 2024-09-11 VITALS — BP 122/60 | HR 58 | Temp 98.5°F | Ht 69.0 in | Wt 206.2 lb

## 2024-09-11 DIAGNOSIS — R0789 Other chest pain: Secondary | ICD-10-CM | POA: Diagnosis not present

## 2024-09-11 DIAGNOSIS — Z794 Long term (current) use of insulin: Secondary | ICD-10-CM | POA: Diagnosis not present

## 2024-09-11 DIAGNOSIS — E1149 Type 2 diabetes mellitus with other diabetic neurological complication: Secondary | ICD-10-CM | POA: Diagnosis not present

## 2024-09-11 DIAGNOSIS — L299 Pruritus, unspecified: Secondary | ICD-10-CM

## 2024-09-11 LAB — POCT GLYCOSYLATED HEMOGLOBIN (HGB A1C): Hemoglobin A1C: 8.2 % — AB (ref 4.0–5.6)

## 2024-09-11 MED ORDER — TRESIBA FLEXTOUCH 100 UNIT/ML ~~LOC~~ SOPN: 70.0000 [IU] | PEN_INJECTOR | Freq: Every day | SUBCUTANEOUS | Status: AC

## 2024-09-11 MED ORDER — CYCLOBENZAPRINE HCL 5 MG PO TABS: 5.0000 mg | ORAL_TABLET | Freq: Three times a day (TID) | ORAL | Status: AC | PRN

## 2024-09-11 NOTE — Progress Notes (Signed)
 Diabetes:  Using medications without difficulties: yes Hypoglycemic episodes: occ, corrected with a snack.  Cautions d/w pt.  He moved insulin  to PM.   Hyperglycemic episodes: no Feet problems: normal sensation, no ulcerations.   Blood Sugars averaging: ~260s eye exam within last year: yes A1c 8.2.   Taking 70 units.  Weight up, appetite up since stopping ozempic .    He can't afford coverage for ozempic .  I am asking pharmacy about coverage options vs SGLT-2/jardiance .   He has shoulder surgery pending.   He saw renal clinic in the meantime.    He had prev exertional discomfort that resolved in the meantime.  He had illness with concurrent chest congestion at the time- his family was also sick at the time.  He has been able to exert for the last week w/o symptoms.  No NTG use.  No sx now.  Prev cards eval d/w pt.    Itching w/o rash on the B groin and upper back, worse with warm water exposure.  No rash.  Started about 1 month ago.    Meds, vitals, and allergies reviewed.   ROS: Per HPI unless specifically indicated in ROS section   GEN: nad, alert and oriented HEENT: mucous membranes moist NECK: supple w/o LA CV: rrr. PULM: ctab, no inc wob ABD: soft, +bs EXT: no edema SKIN: no acute rash  Diabetic foot exam: Normal inspection except for hammertoes.  No skin breakdown No calluses  Normal DP pulses Normal sensation to light touch but dec to monofilament Nails with mild onychomycosis changes on R 3rd and 4th nails.

## 2024-09-11 NOTE — Patient Instructions (Addendum)
 Let cardiology know if you have more chest symptoms.   I'll check on diabetes options in the meantime.   Limit hot water exposure.  Use mild soap and try using generic eucerin cream on the skin.

## 2024-09-14 ENCOUNTER — Ambulatory Visit

## 2024-09-15 ENCOUNTER — Telehealth (HOSPITAL_BASED_OUTPATIENT_CLINIC_OR_DEPARTMENT_OTHER): Payer: Self-pay | Admitting: *Deleted

## 2024-09-15 NOTE — Telephone Encounter (Signed)
 CMN received for CPAP supplies signed by provider and faxed confirmation received DME: [employer]

## 2024-09-17 DIAGNOSIS — L299 Pruritus, unspecified: Secondary | ICD-10-CM | POA: Insufficient documentation

## 2024-09-17 NOTE — Assessment & Plan Note (Signed)
 A1c 8.2.   Taking 70 units.  Weight up, appetite up since stopping ozempic .   He can't afford coverage for ozempic .  I am asking pharmacy about coverage options vs SGLT-2/jardiance .  See orderes.

## 2024-09-17 NOTE — Assessment & Plan Note (Signed)
 He had prev exertional discomfort that resolved in the meantime.  He had illness with concurrent chest congestion at the time- his family was also sick at the time.  He has been able to exert for the last week w/o symptoms.  No NTG use.  No sx now. I asked him to update me as needed.  It appears that his previous discomfort was related to concurrent illness that is now resolved and not a primary cardiac issue.  I asked him to update cardiology if he has return of symptoms.

## 2024-09-17 NOTE — Assessment & Plan Note (Signed)
 Without rash.  Discussed options. Would limit hot water exposure.  Use mild soap and try using generic eucerin cream on the skin.  Update me as needed.

## 2024-09-20 ENCOUNTER — Telehealth: Payer: Self-pay

## 2024-09-20 NOTE — Progress Notes (Signed)
 Complex Care Management Note Care Guide Note  09/20/2024 Name: Tony Manning MRN: [id number] DOB: [birth year]   Complex Care Management Outreach Attempts: An unsuccessful telephone outreach was attempted today to offer the patient information about available complex care management services.  Follow Up Plan:  Additional outreach attempts will be made to offer the patient complex care management information and services.   Encounter Outcome:  No Answer  Dreama [facility]wood [facility]  [facility], [facility] VBCI Assistant Direct Dial: [phone]  Fax: [phone]

## 2024-09-21 NOTE — Progress Notes (Signed)
 Tony Manning                                          MRN: [id number]   09/21/2024   The VBCI Quality Team Specialist reviewed this patient medical record for the purposes of chart review for care gap closure. The following were reviewed: abstraction for care gap closure-controlling blood pressure.    VBCI Quality Team

## 2024-09-22 NOTE — Progress Notes (Signed)
 Tony Manning                                          MRN: [id number]   09/22/2024   The VBCI Quality Team Specialist reviewed this patient medical record for the purposes of chart review for care gap closure. The following were reviewed: abstraction for care gap closure-glycemic status assessment.    VBCI Quality Team

## 2024-09-25 NOTE — Progress Notes (Unsigned)
 Complex Care Management Note Care Guide Note  09/25/2024 Name: Tony Manning MRN: [id number] DOB: 1960-12-29   Complex Care Management Outreach Attempts: A second unsuccessful outreach was attempted today to offer the patient with information about available complex care management services.  Follow Up Plan:  Additional outreach attempts will be made to offer the patient complex care management information and services.   Encounter Outcome:  No Answer  Dreama Lynwood [facility]  [facility], [facility] VBCI Assistant Direct Dial: [phone]  Fax: [phone]

## 2024-09-26 ENCOUNTER — Other Ambulatory Visit: Payer: Self-pay | Admitting: Family Medicine

## 2024-09-27 ENCOUNTER — Other Ambulatory Visit (HOSPITAL_COMMUNITY): Payer: Self-pay | Admitting: Neurology

## 2024-09-27 MED ORDER — STUDY - LIBREXIA-STROKE - MILVEXIAN 25 MG OR PLACEBO TABLET (PI-SETHI): 1.0000 | ORAL_TABLET | Freq: Two times a day (BID) | ORAL | 0 refills | Status: AC

## 2024-09-27 NOTE — Telephone Encounter (Signed)
 Should be okay to continue.  Sent. Thanks.

## 2024-09-27 NOTE — Telephone Encounter (Signed)
 From chart review looks like patient has been referred to pain clinic. Ok to continue refill?

## 2024-09-28 NOTE — Progress Notes (Signed)
 Complex Care Management Note  Care Guide Note 09/28/2024 Name: Tony Manning MRN: [id number] DOB: Feb 01, 1961  Tony Manning is a 64 y.o. year old male who sees Cleatus Arlyss RAMAN, MD for primary care. I reached out to Lonni CHRISTELLA Salmon by phone today to offer complex care management services.  [name] was given information about Complex Care Management services today including:   The Complex Care Management services include support from the care team which includes your Nurse Care Manager, Clinical Social Worker, or Pharmacist.  The Complex Care Management team is here to help remove barriers to the health concerns and goals most important to you. Complex Care Management services are voluntary, and the patient may decline or stop services at any time by request to their care team member.   Complex Care Management Consent Status: Patient agreed to services and verbal consent obtained.   Follow up plan:  Telephone appointment with complex care management team member scheduled for:  10/04/24 at 10:00 a.m.   Encounter Outcome:  Patient Scheduled  Dreama [facility]  [facility], [facility] VBCI Assistant Direct Dial: [phone]  Fax: [phone]

## 2024-09-28 NOTE — Progress Notes (Signed)
 Complex Care Management Note Care Guide Note  09/28/2024 Name: Tony Manning MRN: [id number] DOB: 29-Feb-1961   Complex Care Management Outreach Attempts: A third unsuccessful outreach was attempted today to offer the patient with information about available complex care management services.  Follow Up Plan:  No further outreach attempts will be made at this time. We have been unable to contact the patient to offer or enroll patient in complex care management services.  Encounter Outcome:  No Answer  Dreama [facility]wood [facility]  [facility], [facility] VBCI Assistant Direct Dial: [phone]  Fax: [phone]

## 2024-10-03 NOTE — Progress Notes (Signed)
 Pre-Chart Notes:  Diabetes: Labs/BG Assessment: Monitoring: CGM/Glucometer: Tresiba : Dose? Tolerance? Plan A: Jardiance  or Ozempic  per PAP B: Increase Tresiba  based on BG readings (10-20% inc) Follow Up    10/03/2024 Name: Tony Manning MRN: [id number] DOB: [birth year]  Subjective  No chief complaint on file.   Reason for visit: ?  Tony Manning is a 64 y.o. male with a history of diabetes (type 2), who presents today for an initial diabetes pharmacotherapy visit. They were referred by their PCP for management of diabetes.  Pertinent PMH also includes HTN, HLD, OSA, NSTEMI, angina, orthostasis, vertigo, renal stenosis, CKD3A, hx nephrolithiasis.   Known DM Complications: neuropathy, diabetic retinopathy .  Care Team: Primary Care Provider: Cleatus Arlyss RAMAN, MD   Date of Last Diabetes Related Visit: with PCP on 09/11/24  Recent Summary of Change: Discussed DM management. Referred to pharmacy for help with medication access as pt cannot afford Ozempic  coverage. PCP would like to assess other coverage options vs. SGLT2i.  Medication Access/Adherence: Prescription drug coverage: Payor: [employer] MEDICARE / Plan: [employer] MEDICARE HMO / Product Type: *No Product type* / .  - Reports that all medications are *** affordable.  - Current Patient Assistance: None*** - Medication Adherence: Patient denies*** missing doses of their medication.    Since Last visit / History of Present Illness: ?  Patient reports implementing*** plan from last visit. Denies adverse effects with titration of ***.   Reported DM Regimen: ?  Tresiba  70 units daily ***   DM medications tried in the past:?  Farxiga  ({ReasonforDiscontinuation:34588}) Jardiance   ({ReasonforDiscontinuation:34588}) Glimpiride  Ozempic  ({ReasonforDiscontinuation:34588})  SMBG: *** ?  ***   Hypo/Hyperglycemia: ?  Symptoms of hypoglycemia since last visit:? {Blank multiple:19197::yes,no,n/a,***}  Symptoms  of hyperglycemia since last visit:? {Blank multiple:19197::yes,no,n/a,***} - {symptoms; hyperglycemia:17903}  Reported Diet: Patient typically eats *** meals per day.  Breakfast: *** Lunch: *** Dinner: *** Snacks: *** Beverages: ***  Exercise: ***  DM Prevention:  Statin: Taking; high intensity.?  History of chronic kidney disease? yes History of albuminuria? no, last UACR on 05/11/24 = 11.5 mg/g ACE/ARB - Taking losartan  50mg ; Urine MA/CR Ratio - normal, <30 Last eye exam: 05/15/22; Retinopathy Present Last foot exam: 07/29/2023 Tobacco Use: Never smoker Immunizations:? Flu: Up to date (Last: 07/06/2024); Pneumococcal: PPSV23 2009, 2014 - Age<65 w Diabetes DUE for PCV; Shingrix: Up to date (Last: 08/18/2022, 05/29/2022); Covid (11/2019, 12/2019, 08/2020, 05/2021)  Cardiovascular Risk Reduction History of clinical ASCVD? yes History of heart failure? no History of hyperlipidemia? yes Current BMI: 30.5 kg/m2 (Ht 69 in, Wt 93.6 kg) Taking statin? yes; high intensity (atorvastatin  40 mg) Taking aspirin ? indicated (secondary prevention); Taking   Taking SGLT-2i? no Taking GLP- 1 RA? no      _______________________________________________  Objective      Physical Examination:  Vitals:  Wt Readings from Last 3 Encounters:  09/11/24 93.6 kg (206 lb 4 oz)  08/09/24 94.3 kg (208 lb)  07/11/24 91.3 kg (201 lb 3.2 oz)   BP Readings from Last 3 Encounters:  09/11/24 122/60  08/09/24 118/64  07/11/24 120/68   Pulse Readings from Last 3 Encounters:  09/11/24 (!) 58  08/09/24 70  07/11/24 63     Labs:?  Lab Results  Component Value Date   HGBA1C 8.2 (A) 09/11/2024   HGBA1C 7.2 (H) 05/11/2024   HGBA1C 8.4 (H) 11/12/2023   GLUCOSE 229 (H) 07/11/2024   MICRALBCREAT 11.5 05/11/2024   MICRALBCREAT 8 09/11/2022   CREATININE 1.44 07/11/2024   CREATININE 2.18 (H)  06/05/2024   CREATININE 1.60 (H) 05/27/2024   GFR 51.41 (L) 07/11/2024   GFR 31.27 (L) 06/05/2024   GFR 41.30  (L) 05/11/2024    Lab Results  Component Value Date   CHOL 110 05/11/2024   LDLCALC 47 05/11/2024   LDLCALC 41 01/05/2024   LDLCALC 44 09/11/2022   HDL 46.10 05/11/2024   TRIG 82.0 05/11/2024   TRIG 129 01/05/2024   TRIG 127 09/11/2022   ALT 33 05/27/2024   ALT 25 05/11/2024   AST 37 05/27/2024   AST 23 05/11/2024      Chemistry      Component Value Date/Time   NA 139 07/11/2024 0943   NA 138 09/21/2017 1156   K 4.5 07/11/2024 0943   CL 105 07/11/2024 0943   CO2 28 07/11/2024 0943   BUN 28 (H) 07/11/2024 0943   BUN 17 09/21/2017 1156   CREATININE 1.44 07/11/2024 0943   CREATININE 1.64 (H) 11/12/2023 1540      Component Value Date/Time   CALCIUM  9.5 07/11/2024 0943   ALKPHOS 69 05/27/2024 1509   AST 37 05/27/2024 1509   ALT 33 05/27/2024 1509   BILITOT 1.0 05/27/2024 1509   BILITOT 0.3 09/21/2017 1156      Assessment and Plan:   1. Diabetes, type 2: uncontrolled per last A1c of 8.2% (09/11/24), increased from previous, 7.2%. Goal <7% without hypoglycemia.  {lzcgmsmbg:31042} data shows ***?. ***  Current Regimen: Tresiba  ***dose***  HCM: {LZ HCM DM2:31604} Continue medications today without changes.  ***  Reviewed s/sx/tx hypoglycemia Future Consideration: GLP1-RA: Previously on Ozempic  but cost prohibitive. Hx negative for pancreatitis, personal/fam hx medullary thyroid  carcinoma or MEN2.  DPP4i: Reasonable alternative to a GPL1 SGLT2i: Reasonable for DM control and CKD  Metformin : Previously on but GI upset; eGFR cannot be calculated (Patient's most recent lab result is older than the maximum 21 days allowed.). Insulin : Currently on Tresiba     2. Medication Access:   Follow Up Follow up with clinical pharmacist via *** in *** weeks Patient given direct line for questions regarding medication therapy  Future Appointments  Date Time Provider Department Center  10/04/2024 10:00 AM [facility] PHARMACIST [facility] [address]  01/19/2025  8:50 AM [facility] ANNUAL  WE[facility] VISIT 1 [facility] [address]    Prentice DOROTHA Favors, PharmD PGY1 Health-System Pharmacy Administration and Leadership Resident [facility]

## 2024-10-04 ENCOUNTER — Other Ambulatory Visit: Admitting: Pharmacist

## 2024-10-04 DIAGNOSIS — E1149 Type 2 diabetes mellitus with other diabetic neurological complication: Secondary | ICD-10-CM

## 2024-10-05 ENCOUNTER — Ambulatory Visit: Payer: Self-pay

## 2024-10-05 NOTE — Telephone Encounter (Signed)
 FYI Only or Action Required?: Action required by provider: request for appointment and refusing triage.  Patient was last seen in primary care on 09/11/2024 by Cleatus Arlyss RAMAN, MD.  Called Nurse Triage reporting Pruritis.  Symptoms began about a month ago.  Interventions attempted: Other: PCP appt 09/11/24.  Symptoms are: gradually worsening.  Triage Disposition: Information or Advice Only Call  Patient/caregiver understands and will follow disposition?: Unsure      Reason for Disposition  Caller hangs up  Answer Assessment - Initial Assessment Questions This RN attempted to ask further triage questions to determine disposition, pt refusing further questions, pt requesting appt, this RN advised that pt would receive call back from PCP office about appt. Pt stated this is ridiculous then hung up. Please call pt back.    1. SITUATION:  Document reason for call.     Pt reporting that he has had whole body itching, no rash, 10/10 itching all the time always something that itches, which has gotten worse since appt with PCP earlier this month. Pt reporting doc told him to come back if not better so calling for appt This RN attempted to ask pt further triage questions, pt states don't have time to be on phone any longer, stating he wants to make an appt and if can't do that then I'll hang up.  3. ASSESSMENT: Document your nursing assessment.     Pt requires further triage  4. RESPONSE: Document what your response or recommendation was.     Advised pt that he would receive a call back from PCP office for appt, then pt hung up.  Protocols used: Difficult Call-A-AH

## 2024-10-06 ENCOUNTER — Other Ambulatory Visit: Payer: Self-pay | Admitting: Family Medicine

## 2024-10-06 DIAGNOSIS — R351 Nocturia: Secondary | ICD-10-CM

## 2024-10-06 NOTE — Telephone Encounter (Signed)
 Tamsulosin  Last filled:  07/07/24 Last OV:  09/11/24, DM f/u Next OV:  10/09/24, itching

## 2024-10-09 ENCOUNTER — Ambulatory Visit: Admitting: Family Medicine

## 2024-10-10 NOTE — Progress Notes (Signed)
 Sent message, via epic in basket, requesting orders in epic from Careers adviser.

## 2024-10-13 ENCOUNTER — Ambulatory Visit: Admitting: Family Medicine

## 2024-10-13 ENCOUNTER — Telehealth: Payer: Self-pay

## 2024-10-13 ENCOUNTER — Other Ambulatory Visit (HOSPITAL_COMMUNITY): Payer: Self-pay

## 2024-10-13 NOTE — Telephone Encounter (Signed)
 Pharmacy Patient Advocate Encounter   Received notification from [facility] KEY that prior authorization for [facility] 3 plus sensor is required/requested.   Insurance verification completed.   The patient is insured through [employer].   Per test claim: PA required; PA submitted to above mentioned insurance via Latent Key/confirmation #/EOC [id number] Status is pending

## 2024-10-15 ENCOUNTER — Ambulatory Visit

## 2024-10-17 ENCOUNTER — Encounter (HOSPITAL_COMMUNITY): Admission: RE | Admit: 2024-10-17

## 2024-10-26 ENCOUNTER — Ambulatory Visit (HOSPITAL_COMMUNITY): Admit: 2024-10-26 | Payer: Self-pay | Admitting: Orthopedic Surgery

## 2024-11-15 ENCOUNTER — Ambulatory Visit

## 2025-01-19 ENCOUNTER — Ambulatory Visit
